# Patient Record
Sex: Male | Born: 1990 | Race: White | Hispanic: No | Marital: Single | Smoking: Former smoker
Health system: Southern US, Community
[De-identification: ages and names within clinical notes are randomized; demographics above are authoritative.]

## PROBLEM LIST (undated history)

## (undated) DIAGNOSIS — G4700 Insomnia, unspecified: Secondary | ICD-10-CM

## (undated) DIAGNOSIS — R079 Chest pain, unspecified: Secondary | ICD-10-CM

## (undated) DIAGNOSIS — I429 Cardiomyopathy, unspecified: Secondary | ICD-10-CM

## (undated) DIAGNOSIS — I10 Essential (primary) hypertension: Secondary | ICD-10-CM

## (undated) DIAGNOSIS — E785 Hyperlipidemia, unspecified: Secondary | ICD-10-CM

## (undated) DIAGNOSIS — F259 Schizoaffective disorder, unspecified: Secondary | ICD-10-CM

## (undated) DIAGNOSIS — K219 Gastro-esophageal reflux disease without esophagitis: Secondary | ICD-10-CM

## (undated) DIAGNOSIS — G4733 Obstructive sleep apnea (adult) (pediatric): Secondary | ICD-10-CM

## (undated) DIAGNOSIS — N35919 Unspecified urethral stricture, male, unspecified site: Secondary | ICD-10-CM

## (undated) DIAGNOSIS — E119 Type 2 diabetes mellitus without complications: Secondary | ICD-10-CM

## (undated) DIAGNOSIS — I4819 Other persistent atrial fibrillation: Secondary | ICD-10-CM

## (undated) DIAGNOSIS — G459 Transient cerebral ischemic attack, unspecified: Secondary | ICD-10-CM

## (undated) DIAGNOSIS — Z7901 Long term (current) use of anticoagulants: Secondary | ICD-10-CM

## (undated) DIAGNOSIS — I4891 Unspecified atrial fibrillation: Secondary | ICD-10-CM

## (undated) DIAGNOSIS — I5030 Unspecified diastolic (congestive) heart failure: Secondary | ICD-10-CM

## (undated) DIAGNOSIS — F449 Dissociative and conversion disorder, unspecified: Secondary | ICD-10-CM

## (undated) DIAGNOSIS — R0600 Dyspnea, unspecified: Secondary | ICD-10-CM

## (undated) DIAGNOSIS — K760 Fatty (change of) liver, not elsewhere classified: Secondary | ICD-10-CM

## (undated) DIAGNOSIS — G40909 Epilepsy, unspecified, not intractable, without status epilepticus: Secondary | ICD-10-CM

## (undated) DIAGNOSIS — I509 Heart failure, unspecified: Secondary | ICD-10-CM

## (undated) HISTORY — DX: Heart failure, unspecified: I50.9

---

## 2013-04-29 ENCOUNTER — Emergency Department: Payer: Self-pay | Admitting: Emergency Medicine

## 2013-04-29 LAB — URINALYSIS, COMPLETE
Bacteria: NONE SEEN
Bilirubin,UR: NEGATIVE
Glucose,UR: NEGATIVE mg/dL (ref 0–75)
Leukocyte Esterase: NEGATIVE
Protein: NEGATIVE
RBC,UR: NONE SEEN /HPF (ref 0–5)
Specific Gravity: 1.017 (ref 1.003–1.030)
Squamous Epithelial: <1
WBC UR: 2 /HPF (ref 0–5)

## 2013-04-29 LAB — DRUG SCREEN, URINE
Amphetamines, Ur Screen: NEGATIVE (ref ?–1000)
Cocaine Metabolite,Ur ~~LOC~~: NEGATIVE (ref ?–300)
Tricyclic, Ur Screen: NEGATIVE (ref ?–1000)

## 2013-04-29 LAB — TSH: Thyroid Stimulating Horm: 0.6 u[IU]/mL

## 2013-04-29 LAB — LITHIUM LEVEL: Lithium: <0.2 mmol/L — ABNORMAL LOW

## 2013-04-29 LAB — COMPREHENSIVE METABOLIC PANEL
Albumin: 3.7 g/dL (ref 3.4–5.0)
Anion Gap: 5 — ABNORMAL LOW (ref 7–16)
BUN: 15 mg/dL (ref 7–18)
Calcium, Total: 9.2 mg/dL (ref 8.5–10.1)
Co2: 25 mmol/L (ref 21–32)
Creatinine: 0.99 mg/dL (ref 0.60–1.30)
EGFR (African American): >60
EGFR (Non-African Amer.): >60
Glucose: 105 mg/dL — ABNORMAL HIGH (ref 65–99)
Osmolality: 273 (ref 275–301)
Potassium: 4 mmol/L (ref 3.5–5.1)
SGOT(AST): 29 U/L (ref 15–37)
SGPT (ALT): 29 U/L (ref 12–78)
Sodium: 136 mmol/L (ref 136–145)

## 2013-04-29 LAB — CBC
MCH: 26.4 pg (ref 26.0–34.0)
MCV: 80 fL (ref 80–100)
RBC: 4.52 10*6/uL (ref 4.40–5.90)
RDW: 14.1 % (ref 11.5–14.5)

## 2013-04-29 LAB — SALICYLATE LEVEL: Salicylates, Serum: <1.7 mg/dL

## 2013-04-29 LAB — ETHANOL: Ethanol %: <0.003 % (ref 0.000–0.080)

## 2013-04-29 LAB — ACETAMINOPHEN LEVEL: Acetaminophen: <2 ug/mL

## 2013-04-29 LAB — TROPONIN I: Troponin-I: <0.02 ng/mL

## 2013-04-30 ENCOUNTER — Emergency Department: Payer: Self-pay | Admitting: Emergency Medicine

## 2014-09-19 NOTE — Consult Note (Signed)
Brief Consult Note: Diagnosis: Boderline personality disorder, pseudoseizures.   Patient was seen by consultant.   Consult note dictated.   Recommend further assessment or treatment.   Comments: [name] has a h/o of diagnosis of schizophrenia, borderline personality disorder, seizures, and pseudoseizures.  He is maintained on Keppra and Topamax for seizure disorder, and Invega and Cymbalta for borderline personality disorder.  The patient is not suicidal, homicidal, or psychotic.    Plan: 1.  The patient no longer meets criteria for IVC.  I will terminate proceedings.  Please discharge as appropriate. 2.  No medication changes recommended.  3.  He will follow up with his neurologist in [place].  Electronic Signatures: Kristine LineaPucilowska, Sora Vrooman (MD)  (Signed 01-Dec-14 15:42)  Authored: Brief Consult Note   Last Updated: 01-Dec-14 15:42 by Kristine LineaPucilowska, Duru Reiger (MD)
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
## 2019-06-01 DIAGNOSIS — F32A Depression, unspecified: Secondary | ICD-10-CM | POA: Insufficient documentation

## 2020-08-04 DIAGNOSIS — I10 Essential (primary) hypertension: Secondary | ICD-10-CM | POA: Insufficient documentation

## 2020-08-31 DIAGNOSIS — R2990 Unspecified symptoms and signs involving the nervous system: Secondary | ICD-10-CM | POA: Insufficient documentation

## 2020-09-02 DIAGNOSIS — R6889 Other general symptoms and signs: Secondary | ICD-10-CM | POA: Insufficient documentation

## 2020-09-11 DIAGNOSIS — Z765 Malingerer [conscious simulation]: Secondary | ICD-10-CM | POA: Insufficient documentation

## 2020-09-13 DIAGNOSIS — D72829 Elevated white blood cell count, unspecified: Secondary | ICD-10-CM | POA: Insufficient documentation

## 2020-09-25 DIAGNOSIS — Z91199 Patient's noncompliance with other medical treatment and regimen due to unspecified reason: Secondary | ICD-10-CM | POA: Insufficient documentation

## 2020-10-10 DIAGNOSIS — R29898 Other symptoms and signs involving the musculoskeletal system: Secondary | ICD-10-CM | POA: Insufficient documentation

## 2020-10-10 DIAGNOSIS — R531 Weakness: Secondary | ICD-10-CM | POA: Insufficient documentation

## 2020-10-16 DIAGNOSIS — K219 Gastro-esophageal reflux disease without esophagitis: Secondary | ICD-10-CM | POA: Insufficient documentation

## 2021-01-28 DIAGNOSIS — F322 Major depressive disorder, single episode, severe without psychotic features: Secondary | ICD-10-CM | POA: Insufficient documentation

## 2021-02-12 DIAGNOSIS — G4734 Idiopathic sleep related nonobstructive alveolar hypoventilation: Secondary | ICD-10-CM | POA: Insufficient documentation

## 2021-03-14 DIAGNOSIS — R531 Weakness: Secondary | ICD-10-CM | POA: Insufficient documentation

## 2021-03-30 DIAGNOSIS — M109 Gout, unspecified: Secondary | ICD-10-CM | POA: Insufficient documentation

## 2021-08-02 ENCOUNTER — Inpatient Hospital Stay: Payer: Medicaid Other

## 2021-08-02 ENCOUNTER — Other Ambulatory Visit: Payer: Self-pay

## 2021-08-02 ENCOUNTER — Emergency Department: Payer: Medicaid Other

## 2021-08-02 ENCOUNTER — Inpatient Hospital Stay
Admission: EM | Admit: 2021-08-02 | Discharge: 2021-08-04 | DRG: 069 | Disposition: A | Discharge status: Home / Self Care | Payer: Medicaid Other | Attending: Internal Medicine | Admitting: Internal Medicine

## 2021-08-02 ENCOUNTER — Encounter: Payer: Self-pay | Admitting: *Deleted

## 2021-08-02 DIAGNOSIS — I4891 Unspecified atrial fibrillation: Secondary | ICD-10-CM

## 2021-08-02 DIAGNOSIS — I10 Essential (primary) hypertension: Secondary | ICD-10-CM | POA: Diagnosis present

## 2021-08-02 DIAGNOSIS — Z20822 Contact with and (suspected) exposure to covid-19: Secondary | ICD-10-CM | POA: Diagnosis present

## 2021-08-02 DIAGNOSIS — I482 Chronic atrial fibrillation, unspecified: Secondary | ICD-10-CM

## 2021-08-02 DIAGNOSIS — Z87891 Personal history of nicotine dependence: Secondary | ICD-10-CM | POA: Diagnosis not present

## 2021-08-02 DIAGNOSIS — Z6841 Body Mass Index (BMI) 40.0 and over, adult: Secondary | ICD-10-CM

## 2021-08-02 DIAGNOSIS — Z7901 Long term (current) use of anticoagulants: Secondary | ICD-10-CM | POA: Diagnosis not present

## 2021-08-02 DIAGNOSIS — R531 Weakness: Secondary | ICD-10-CM

## 2021-08-02 DIAGNOSIS — G8929 Other chronic pain: Secondary | ICD-10-CM | POA: Diagnosis present

## 2021-08-02 DIAGNOSIS — Z7982 Long term (current) use of aspirin: Secondary | ICD-10-CM | POA: Diagnosis not present

## 2021-08-02 DIAGNOSIS — R079 Chest pain, unspecified: Secondary | ICD-10-CM | POA: Diagnosis not present

## 2021-08-02 DIAGNOSIS — R519 Headache, unspecified: Secondary | ICD-10-CM

## 2021-08-02 DIAGNOSIS — E785 Hyperlipidemia, unspecified: Secondary | ICD-10-CM | POA: Diagnosis present

## 2021-08-02 DIAGNOSIS — Z8673 Personal history of transient ischemic attack (TIA), and cerebral infarction without residual deficits: Secondary | ICD-10-CM | POA: Diagnosis not present

## 2021-08-02 DIAGNOSIS — R0789 Other chest pain: Secondary | ICD-10-CM | POA: Diagnosis present

## 2021-08-02 DIAGNOSIS — Z79899 Other long term (current) drug therapy: Secondary | ICD-10-CM | POA: Diagnosis not present

## 2021-08-02 DIAGNOSIS — E119 Type 2 diabetes mellitus without complications: Secondary | ICD-10-CM | POA: Diagnosis present

## 2021-08-02 DIAGNOSIS — G4733 Obstructive sleep apnea (adult) (pediatric): Secondary | ICD-10-CM | POA: Diagnosis present

## 2021-08-02 DIAGNOSIS — I480 Paroxysmal atrial fibrillation: Secondary | ICD-10-CM | POA: Diagnosis present

## 2021-08-02 DIAGNOSIS — G459 Transient cerebral ischemic attack, unspecified: Secondary | ICD-10-CM | POA: Diagnosis present

## 2021-08-02 DIAGNOSIS — G8194 Hemiplegia, unspecified affecting left nondominant side: Secondary | ICD-10-CM | POA: Diagnosis present

## 2021-08-02 DIAGNOSIS — I4819 Other persistent atrial fibrillation: Secondary | ICD-10-CM | POA: Diagnosis not present

## 2021-08-02 HISTORY — DX: Type 2 diabetes mellitus without complications: E11.9

## 2021-08-02 HISTORY — DX: Obstructive sleep apnea (adult) (pediatric): G47.33

## 2021-08-02 HISTORY — DX: Long term (current) use of anticoagulants: Z79.01

## 2021-08-02 HISTORY — DX: Unspecified atrial fibrillation: I48.91

## 2021-08-02 HISTORY — DX: Essential (primary) hypertension: I10

## 2021-08-02 HISTORY — DX: Hyperlipidemia, unspecified: E78.5

## 2021-08-02 HISTORY — DX: Transient cerebral ischemic attack, unspecified: G45.9

## 2021-08-02 LAB — RESP PANEL BY RT-PCR (FLU A&B, COVID) ARPGX2
Influenza A by PCR: NEGATIVE
Influenza B by PCR: NEGATIVE
SARS Coronavirus 2 by RT PCR: NEGATIVE

## 2021-08-02 LAB — TROPONIN I (HIGH SENSITIVITY)
Troponin I (High Sensitivity): 3 ng/L (ref <18)
Troponin I (High Sensitivity): 4 ng/L (ref <18)

## 2021-08-02 LAB — URINALYSIS, ROUTINE W REFLEX MICROSCOPIC
Bilirubin Urine: NEGATIVE
Glucose, UA: NEGATIVE mg/dL
Hgb urine dipstick: NEGATIVE
Ketones, ur: NEGATIVE mg/dL
Nitrite: NEGATIVE
Protein, ur: NEGATIVE mg/dL
Specific Gravity, Urine: 1.017 (ref 1.005–1.030)
pH: 7 (ref 5.0–8.0)

## 2021-08-02 LAB — URINE DRUG SCREEN, QUALITATIVE (ARMC ONLY)
Amphetamines, Ur Screen: NOT DETECTED
Barbiturates, Ur Screen: NOT DETECTED
Benzodiazepine, Ur Scrn: NOT DETECTED
Cannabinoid 50 Ng, Ur ~~LOC~~: NOT DETECTED
Cocaine Metabolite,Ur ~~LOC~~: NOT DETECTED
MDMA (Ecstasy)Ur Screen: NOT DETECTED
Methadone Scn, Ur: NOT DETECTED
Opiate, Ur Screen: NOT DETECTED
Phencyclidine (PCP) Ur S: NOT DETECTED
Tricyclic, Ur Screen: NOT DETECTED

## 2021-08-02 LAB — CBC
HCT: 43 % (ref 39.0–52.0)
Hemoglobin: 13.8 g/dL (ref 13.0–17.0)
MCH: 27.8 pg (ref 26.0–34.0)
MCHC: 32.1 g/dL (ref 30.0–36.0)
MCV: 86.5 fL (ref 80.0–100.0)
Platelets: 220 10*3/uL (ref 150–400)
RBC: 4.97 MIL/uL (ref 4.22–5.81)
RDW: 13.3 % (ref 11.5–15.5)
WBC: 7.4 10*3/uL (ref 4.0–10.5)
nRBC: 0 % (ref 0.0–0.2)

## 2021-08-02 LAB — BASIC METABOLIC PANEL
Anion gap: 8 (ref 5–15)
BUN: 15 mg/dL (ref 6–20)
CO2: 32 mmol/L (ref 22–32)
Calcium: 8.8 mg/dL — ABNORMAL LOW (ref 8.9–10.3)
Chloride: 98 mmol/L (ref 98–111)
Creatinine, Ser: 0.8 mg/dL (ref 0.61–1.24)
GFR, Estimated: >60 mL/min (ref >60)
Glucose, Bld: 94 mg/dL (ref 70–99)
Potassium: 4.2 mmol/L (ref 3.5–5.1)
Sodium: 138 mmol/L (ref 135–145)

## 2021-08-02 LAB — PROTIME-INR
INR: 1 (ref 0.8–1.2)
Prothrombin Time: 13.3 seconds (ref 11.4–15.2)

## 2021-08-02 LAB — APTT: aPTT: 30 seconds (ref 24–36)

## 2021-08-02 MED ORDER — IOHEXOL 350 MG/ML SOLN
125.0000 mL | Freq: Once | INTRAVENOUS | Status: AC | PRN
  Administered 2021-08-02: 125 mL via INTRAVENOUS
  Filled 2021-08-02: qty 125

## 2021-08-02 NOTE — Assessment & Plan Note (Signed)
MRI negative ?Can consider nephrology consult in the a.m. ?Can consider nephrology consult in the a.m. ?

## 2021-08-02 NOTE — Assessment & Plan Note (Signed)
Rate controlled - Continue diltiazem - Continue apixaban 

## 2021-08-02 NOTE — H&P (Signed)
?History and Physical  ? ? ?Patient: Daniel [name] ZOX:[id number] DOB: 02-Jun-1990 ?DOA: 08/02/2021 ?DOS: the patient was seen and examined on 08/02/2021 ?PCP: Pcp, No  ?Patient coming from: Home ? ?Chief Complaint:  ?Chief Complaint  ?Patient presents with  ? Chest Pain  ? ? ?HPI: Daniel [name] is a 31 y.o. male with medical history significant for A-fib on Eliquis, class III obesity, prior TIA who presents to the ED with chest pain, headache, left-sided weakness ongoing for the past few days.  States his chest pain is mid chest radiating to the left arm.  His left-sided weakness started a day prior and is associated with a headache. ?ED course: BP 153/80, pulse 108 with otherwise normal vitals ?Blood work unremarkable with troponin of 4 and normal UDS ?EKG, A-fib at 97 with no acute ST-T wave changes ?Imaging: MRI, CTA head and neck all negative for stroke ?Hospitalist consulted for admission.  ? ?Review of Systems: As mentioned in the history of present illness. All other systems reviewed and are negative. ?No past medical history on file. ?The histories are not reviewed yet. Please review them in the "History" navigator section and refresh this SmartLink. ?Social History:  reports that he has quit smoking. His smoking use included cigarettes. He has never used smokeless tobacco. He reports that he does not currently use alcohol. No history on file for drug use. ? ?Allergies  ?Allergen Reactions  ? Demerol [Meperidine Hcl]   ? Haldol [Haloperidol]   ? ? ?No family history on file. ? ?Prior to Admission medications   ?Medication Sig Start Date End Date Taking? Authorizing Provider  ?albuterol (VENTOLIN HFA) 108 (90 Base) MCG/ACT inhaler Inhale into the lungs. 07/12/21   [provider]  ?allopurinol (ZYLOPRIM) 100 MG tablet Take 100 mg by mouth daily. 06/01/21   [provider]  ?aspirin 325 MG tablet Take 325 mg by mouth every morning. 06/01/21   [provider]  ?atorvastatin (LIPITOR) 80  MG tablet Take 80 mg by mouth daily. 05/11/21   [provider]  ?CARTIA XT 240 MG 24 hr capsule Take 240 mg by mouth daily. 07/26/21   [provider]  ?cephALEXin (KEFLEX) 500 MG capsule Take 500 mg by mouth 2 (two) times daily. 06/16/21   [provider]  ?cyclobenzaprine (FLEXERIL) 5 MG tablet Take 5 mg by mouth 3 (three) times daily. 06/16/21   [provider]  ?dicyclomine (BENTYL) 10 MG capsule Take 1 mg by mouth daily. 06/01/21   [provider]  ?diltiazem (CARDIZEM SR) 90 MG 12 hr capsule Take 180 mg by mouth 2 (two) times daily. 07/12/21   [provider]  ?divalproex (DEPAKOTE) 500 MG DR tablet Take by mouth. 07/21/21   [provider]  ?ELIQUIS 5 MG TABS tablet Take 5 mg by mouth 2 (two) times daily. 06/16/21   [provider]  ?escitalopram (LEXAPRO) 10 MG tablet Take 10 mg by mouth daily. 06/16/21   [provider]  ?famotidine (PEPCID) 20 MG tablet  05/11/21   [provider]  ?fluticasone (FLONASE) 50 MCG/ACT nasal spray Place 2 sprays into both nostrils 2 (two) times daily. 06/16/21   [provider]  ?hydrochlorothiazide (HYDRODIURIL) 25 MG tablet Take 25 mg by mouth every morning. 06/16/21   [provider]  ?hydrOXYzine (ATARAX) 50 MG tablet Take 50 mg by mouth 3 (three) times daily. 07/21/21   [provider]  ?ibuprofen (ADVIL) 800 MG tablet Take 800 mg by mouth 3 (three) times  daily. 07/09/21   [provider]  ?INVEGA 3 MG 24 hr tablet Take 3 mg by mouth daily. 02/05/21   [provider]  ?INVEGA 9 MG 24 hr tablet Take 9 mg by mouth every morning. 07/21/21   [provider]  ?loratadine (CLARITIN) 10 MG tablet Take 10 mg by mouth daily. 06/16/21   [provider]  ?melatonin 3 MG TABS tablet Take by mouth. 06/08/21   [provider]  ?metoprolol tartrate (LOPRESSOR) 50 MG tablet  05/11/21   [provider]  ? ? ?Physical Exam: ?Vitals:  ?  08/02/21 1729 08/02/21 1730  ?BP: (!) 153/80   ?Pulse: (!) 108   ?Resp: 18   ?Temp: 98.7 ?F (37.1 ?C)   ?TempSrc: Oral   ?SpO2: 95%   ?Weight:  (!) 179.6 kg  ?Height:  6\' 5"  (1.956 m)  ? ?Physical Exam ?Vitals and nursing note reviewed.  ?Constitutional:   ?   General: He is not in acute distress. ?   Appearance: Normal appearance. He is obese.  ?HENT:  ?   Head: Normocephalic and atraumatic.  ?Cardiovascular:  ?   Rate and Rhythm: Normal rate and regular rhythm.  ?   Pulses: Normal pulses.  ?   Heart sounds: Normal heart sounds. No murmur heard. ?Pulmonary:  ?   Effort: Pulmonary effort is normal.  ?   Breath sounds: Normal breath sounds. No wheezing or rhonchi.  ?Abdominal:  ?   General: Bowel sounds are normal.  ?   Palpations: Abdomen is soft.  ?   Tenderness: There is no abdominal tenderness.  ?Musculoskeletal:     ?   General: No swelling or tenderness. Normal range of motion.  ?   Cervical back: Normal range of motion and neck supple.  ?Skin: ?   General: Skin is warm and dry.  ?Neurological:  ?   General: No focal deficit present.  ?   Mental Status: He is alert. Mental status is at baseline.  ?Psychiatric:     ?   Mood and Affect: Mood normal.     ?   Behavior: Behavior normal.  ? ? ? ?Data Reviewed: ?Relevant notes from primary care and specialist visits, past discharge summaries as available in EHR, including Care Everywhere. ?Prior diagnostic testing as pertinent to current admission diagnoses ?Updated medications and problem lists for reconciliation ?ED course, including vitals, labs, imaging, treatment and response to treatment ?Triage notes, nursing and pharmacy notes and ED provider's notes ?Notable results as noted in HPI ? ? ?Assessment and Plan: ?* Left-sided weakness ?No evidence of stroke on extensive imaging work-up ?Continue Eliquis ?Continue statin ? ?Chest pain ?Troponin 4 ?Continue to trend ?Low suspicion for ACS ? ?Headache ?MRI negative ?Can consider nephrology consult in the a.m. ?Can  consider nephrology consult in the a.m. ? ?Chronic anticoagulation ?Continue apixaban ? ?Atrial fibrillation, chronic (HCC) ?Rate controlled.  Continue diltiazem  ?Continue apixaban ? ?Obesity, Class III, BMI 40-49.9 (morbid obesity) (HCC) ?Complicating factor to overall prognosis and care ? ? ? ? ? ? ?Advance Care Planning:   Code Status: Not on file full ? ?Consults: none ? ?Family Communication: none ? ?Severity of Illness: ?The appropriate patient status for this patient is INPATIENT. Inpatient status is judged to be reasonable and necessary in order to provide the required intensity of service to ensure the patient's safety. The patient's presenting symptoms, physical exam findings, and initial radiographic and laboratory data in the context of their chronic comorbidities is felt  to place them at high risk for further clinical deterioration. Furthermore, it is not anticipated that the patient will be medically stable for discharge from the hospital within 2 midnights of admission.  ? ?* I certify that at the point of admission it is my clinical judgment that the patient will require inpatient hospital care spanning beyond 2 midnights from the point of admission due to high intensity of service, high risk for further deterioration and high frequency of surveillance required.* ? ?Author: ?Andris Baumann, MD ?08/02/2021 11:57 PM ? ?For on call review [email or web address].  ?

## 2021-08-02 NOTE — Assessment & Plan Note (Signed)
No evidence of stroke on extensive imaging work-up ?Continue Eliquis ?Continue statin ?

## 2021-08-02 NOTE — ED Notes (Signed)
Pt to mri 

## 2021-08-02 NOTE — ED Provider Notes (Signed)
? ?[facility] ?Provider Note ? ? ? Event Date/Time  ? First MD Initiated Contact with Patient 08/02/21 2002   ?  (approximate) ? ? ?History  ? ?Chest Pain ? ? ?HPI ? ?Daniel [name] is a 31 y.o. male with a past medical history of TIA, A-fib on diltiazem and anticoagulated on Eliquis who presents for evaluation of several symptoms including chest pain, headache and some left-sided weakness.  Patient states he has had some substernal chest pain for about 3 to 4 days rating to the left arm.  States he developed some left arm and leg weakness 3 days ago as well as decree sensation.  States he developed headache 1 day ago.  States he feels that he has decreased vision in the left visual fields that he noticed yesterday.  He has not had any headache, back pain, cough, shortness of breath, vertigo, other vision changes, right arm or leg weakness or decrease sensation, abdominal pain, vomiting, diarrhea rash or extremity pain.  Denies any EtOH use, illicit drug use or tobacco abuse.  States he is compliant with all his medications.  States he has never had any weakness like this before. ? ?  ? ? ?Physical Exam  ?Triage Vital Signs: ?ED Triage Vitals  ?Enc Vitals Group  ?   BP 08/02/21 1729 (!) 153/80  ?   Pulse Rate 08/02/21 1729 (!) 108  ?   Resp 08/02/21 1729 18  ?   Temp 08/02/21 1729 98.7 ?F (37.1 ?C)  ?   Temp Source 08/02/21 1729 Oral  ?   SpO2 08/02/21 1729 95 %  ?   Weight 08/02/21 1730 (!) 396 lb (179.6 kg)  ?   Height 08/02/21 1730 6\' 5"  (1.956 m)  ?   Head Circumference --   ?   Peak Flow --   ?   Pain Score 08/02/21 1730 10  ?   Pain Loc --   ?   Pain Edu? --   ?   Excl. in [place]? --   ? ? ?Most recent vital signs: ?Vitals:  ? 08/02/21 1729  ?BP: (!) 153/80  ?Pulse: (!) 108  ?Resp: 18  ?Temp: 98.7 ?F (37.1 ?C)  ?SpO2: 95%  ? ? ?General: Awake, no distress.  ?CV:  Good peripheral perfusion.  Slightly tachycardic with irregular rhythm.  2+ radial pulse. ?Resp:  Normal effort.  Clear  bilaterally. ?Abd:  No distention.  Soft throughout. ?Other:  With exception of decreased vision and left-sided visual field cranial nerves II through XII appear grossly intact.  Patient does seem weaker in the left arm and leg compared to the right and reports decree sensation to light touch.  No finger dysmetria. ? ? ?ED Results / Procedures / Treatments  ?Labs ?(all labs ordered are listed, but only abnormal results are displayed) ?Labs Reviewed  ?BASIC METABOLIC PANEL - Abnormal; Notable for the following components:  ?    Result Value  ? Calcium 8.8 (*)   ? All other components within normal limits  ?URINALYSIS, ROUTINE W REFLEX MICROSCOPIC - Abnormal; Notable for the following components:  ? Color, Urine YELLOW (*)   ? APPearance CLEAR (*)   ? Leukocytes,Ua MODERATE (*)   ? Bacteria, UA RARE (*)   ? All other components within normal limits  ?RESP PANEL BY RT-PCR (FLU A&B, COVID) ARPGX2  ?CBC  ?PROTIME-INR  ?APTT  ?URINE DRUG SCREEN, QUALITATIVE ([facility] ONLY)  ?VALPROIC ACID LEVEL  ?TROPONIN I (HIGH SENSITIVITY)  ?TROPONIN  I (HIGH SENSITIVITY)  ? ? ? ?EKG ? ?ECG is remarkable A-fib with a ventricular rate of 97, otherwise unremarkable intervals some nonspecific changes throughout versus artifact. ? ? ?RADIOLOGY ?Chest reviewed by myself shows no focal consoidation, effusion, edema, pneumothorax or other clear acute thoracic process. I also reviewed radiology interpretation and agree with findings described. ? ?CTA chest, interpretation without evidence of dissection, large aneurysm, pericardial effusion, saddle PE, pneumothorax, effusion, edema or other clear acute thoracic process.  I also reviewed radiologist interpretation and agree with their findings of same including ability to completely exclude more distal PE. ? ?CTA head and neck on my interpretation without evidence of large vessel occlusion, intracranial hemorrhage, obvious ischemia or other clear acute intracranial process.  I also reviewed radiology  interpretation and agree with their findings of same. ? ? ?PROCEDURES: ? ?Critical Care performed: No ? ?Procedures ? ? ? ?MEDICATIONS ORDERED IN ED: ?Medications  ?iohexol (OMNIPAQUE) 350 MG/ML injection 125 mL (125 mLs Intravenous Contrast Given 08/02/21 2111)  ? ? ? ?IMPRESSION / MDM / ASSESSMENT AND PLAN / ED COURSE  ?I reviewed the triage vital signs and the nursing notes. ?             ?               ? ?Differential diagnosis includes, but is not limited to ACS, dissection, PE, CVA, intracranial hemorrhage, metabolic derangements, anemia, pneumonia and bronchitis. ? ?Chest reviewed by myself shows no focal consoidation, effusion, edema, pneumothorax or other clear acute thoracic process. I also reviewed radiology interpretation and agree with findings described. ? ?ECG is remarkable A-fib with a ventricular rate of 97, otherwise unremarkable intervals some nonspecific changes throughout versus artifact.  Nonelevated troponin x2 is not suggestive of ACS or myocarditis.   ? ?COVID influenza PCR is negative.  UA shows some moderate leukocyte esterase and rare bacteria but otherwise unremarkable.  Urine drug screen is negative.  Patient has no urinary symptoms to suggest clinically significant cystitis at this time.  INR and PTT are unremarkable. ? ?BMP shows no significant electrolyte or metabolic derangements.  CBC without leukocytosis or acute anemia. ? ?CTA chest, interpretation without evidence of dissection, large aneurysm, pericardial effusion, saddle PE, pneumothorax, effusion, edema or other clear acute thoracic process.  I also reviewed radiologist interpretation and agree with their findings of same including ability to completely exclude more distal PE. ? ?CTA head and neck on my interpretation without evidence of large vessel occlusion, intracranial hemorrhage, obvious ischemia or other clear acute intracranial process.  I also reviewed radiology interpretation and agree with their findings of  same. ? ?Low suspicion for PE given patient reports compliance with his Eliquis. ? ?Given concern for possible CVA I will admit to medicine service for further evaluation and management.  I discussed this with admitting hospitalist.  Unclear etiology of patient's chest discomfort at this time. ? ?  ? ? ?FINAL CLINICAL IMPRESSION(S) / ED DIAGNOSES  ? ?Final diagnoses:  ?Chest pain, unspecified type  ?Weakness  ?Atrial fibrillation, unspecified type ([place])  ?Anticoagulated  ? ? ? ?Rx / DC Orders  ? ?ED Discharge Orders   ? ? None  ? ?  ? ? ? ?Note:  This document was prepared using Dragon voice recognition software and may include unintentional dictation errors. ?  ?Lucrezia Starch, MD ?08/02/21 2244 ? ?

## 2021-08-02 NOTE — Assessment & Plan Note (Signed)
Complicating factor to overall prognosis and care 

## 2021-08-02 NOTE — ED Notes (Signed)
First Nurse Note:  Pt to ED via [facility] from home for central chest pain for the last 2 days that radiates into his left arm. Pain comes and goes. Pt has hx/o A. Fib. EKG showed A. Fib with RVR at a rate of 80-130. Pt was given about 500 cc of fluid and HR now between 70-110. BP 112/48. Pt in ICU 2 weeks ago for A FIb RVR,  ?

## 2021-08-02 NOTE — ED Notes (Signed)
Pt sleeping. 

## 2021-08-02 NOTE — Assessment & Plan Note (Signed)
Continue apixaban 

## 2021-08-02 NOTE — ED Notes (Signed)
Kelly called at [facility] to inform of pending admission of pt.  ?

## 2021-08-02 NOTE — ED Notes (Addendum)
[facility] HOME  ?Claiborne Billings [phone] - transportation to from home  ?Awanda Mink [phone] - [facility] provider  ?

## 2021-08-02 NOTE — ED Triage Notes (Signed)
Pt brought in via ems from home with chest pain   sx began 2 days  hx afib.  Iv in place.  Pt also has sob.  Nonsmoker  pt alert.  ?

## 2021-08-02 NOTE — Assessment & Plan Note (Signed)
Troponin 4 ?Continue to trend ?Low suspicion for ACS ?

## 2021-08-03 ENCOUNTER — Encounter: Payer: Self-pay | Admitting: Internal Medicine

## 2021-08-03 DIAGNOSIS — R079 Chest pain, unspecified: Secondary | ICD-10-CM

## 2021-08-03 DIAGNOSIS — R531 Weakness: Secondary | ICD-10-CM | POA: Diagnosis not present

## 2021-08-03 DIAGNOSIS — Z8673 Personal history of transient ischemic attack (TIA), and cerebral infarction without residual deficits: Secondary | ICD-10-CM

## 2021-08-03 DIAGNOSIS — G4733 Obstructive sleep apnea (adult) (pediatric): Secondary | ICD-10-CM

## 2021-08-03 DIAGNOSIS — I480 Paroxysmal atrial fibrillation: Secondary | ICD-10-CM

## 2021-08-03 LAB — MAGNESIUM: Magnesium: 2 mg/dL (ref 1.7–2.4)

## 2021-08-03 LAB — TSH: TSH: 1.346 u[IU]/mL (ref 0.350–4.500)

## 2021-08-03 LAB — HIV ANTIBODY (ROUTINE TESTING W REFLEX): HIV Screen 4th Generation wRfx: NONREACTIVE

## 2021-08-03 LAB — CBG MONITORING, ED: Glucose-Capillary: 132 mg/dL — ABNORMAL HIGH (ref 70–99)

## 2021-08-03 LAB — VALPROIC ACID LEVEL: Valproic Acid Lvl: 63 ug/mL (ref 50.0–100.0)

## 2021-08-03 MED ORDER — ONDANSETRON HCL 4 MG/2ML IJ SOLN
4.0000 mg | Freq: Four times a day (QID) | INTRAMUSCULAR | Status: DC | PRN
Start: 2021-08-03 — End: 2021-08-04
  Administered 2021-08-03: 4 mg via INTRAVENOUS
  Filled 2021-08-03: qty 2

## 2021-08-03 MED ORDER — DIVALPROEX SODIUM 500 MG PO DR TAB
1000.0000 mg | DELAYED_RELEASE_TABLET | Freq: Every morning | ORAL | Status: DC
  Administered 2021-08-03 – 2021-08-04 (×2): 1000 mg via ORAL
  Filled 2021-08-03 (×2): qty 2

## 2021-08-03 MED ORDER — BACLOFEN 10 MG PO TABS
10.0000 mg | ORAL_TABLET | Freq: Two times a day (BID) | ORAL | Status: DC
  Administered 2021-08-03 – 2021-08-04 (×2): 10 mg via ORAL
  Filled 2021-08-03 (×3): qty 1

## 2021-08-03 MED ORDER — MORPHINE SULFATE (PF) 2 MG/ML IV SOLN: 2.0000 mg | INTRAVENOUS | Status: DC | PRN

## 2021-08-03 MED ORDER — METOPROLOL TARTRATE 50 MG PO TABS
100.0000 mg | ORAL_TABLET | Freq: Two times a day (BID) | ORAL | Status: DC
  Administered 2021-08-03 – 2021-08-04 (×2): 100 mg via ORAL
  Filled 2021-08-03 (×2): qty 2

## 2021-08-03 MED ORDER — METOPROLOL TARTRATE 50 MG PO TABS
75.0000 mg | ORAL_TABLET | Freq: Once | ORAL | Status: AC
  Administered 2021-08-03: 75 mg via ORAL
  Filled 2021-08-03: qty 1

## 2021-08-03 MED ORDER — PALIPERIDONE ER 3 MG PO TB24
9.0000 mg | ORAL_TABLET | Freq: Every morning | ORAL | Status: DC
Start: 2021-08-03 — End: 2021-08-04
  Administered 2021-08-03 – 2021-08-04 (×2): 9 mg via ORAL
  Filled 2021-08-03 (×2): qty 3

## 2021-08-03 MED ORDER — ONDANSETRON HCL 4 MG PO TABS: 4.0000 mg | ORAL_TABLET | Freq: Four times a day (QID) | ORAL | Status: DC | PRN

## 2021-08-03 MED ORDER — ATORVASTATIN CALCIUM 80 MG PO TABS
80.0000 mg | ORAL_TABLET | Freq: Every day | ORAL | Status: DC
  Administered 2021-08-03 – 2021-08-04 (×2): 80 mg via ORAL
  Filled 2021-08-03 (×2): qty 1

## 2021-08-03 MED ORDER — APIXABAN 5 MG PO TABS
5.0000 mg | ORAL_TABLET | Freq: Two times a day (BID) | ORAL | Status: DC
  Administered 2021-08-03 – 2021-08-04 (×3): 5 mg via ORAL
  Filled 2021-08-03 (×3): qty 1

## 2021-08-03 MED ORDER — FLUTICASONE PROPIONATE 50 MCG/ACT NA SUSP
2.0000 | Freq: Two times a day (BID) | NASAL | Status: DC
  Filled 2021-08-03: qty 16

## 2021-08-03 MED ORDER — PALIPERIDONE ER 3 MG PO TB24: 3.0000 mg | ORAL_TABLET | Freq: Every day | ORAL | Status: DC

## 2021-08-03 MED ORDER — METOPROLOL TARTRATE 25 MG PO TABS
25.0000 mg | ORAL_TABLET | Freq: Two times a day (BID) | ORAL | Status: DC
  Administered 2021-08-03: 25 mg via ORAL
  Filled 2021-08-03: qty 1

## 2021-08-03 MED ORDER — DIVALPROEX SODIUM 500 MG PO DR TAB
1500.0000 mg | DELAYED_RELEASE_TABLET | Freq: Every day | ORAL | Status: DC
Start: 2021-08-03 — End: 2021-08-04
  Administered 2021-08-03: 1500 mg via ORAL
  Filled 2021-08-03 (×3): qty 3

## 2021-08-03 MED ORDER — ACETAMINOPHEN 325 MG PO TABS
650.0000 mg | ORAL_TABLET | Freq: Four times a day (QID) | ORAL | Status: DC | PRN
  Administered 2021-08-03: 650 mg via ORAL
  Filled 2021-08-03: qty 2

## 2021-08-03 MED ORDER — ESCITALOPRAM OXALATE 10 MG PO TABS
10.0000 mg | ORAL_TABLET | Freq: Every day | ORAL | Status: DC
Start: 2021-08-03 — End: 2021-08-04
  Administered 2021-08-03 – 2021-08-04 (×2): 10 mg via ORAL
  Filled 2021-08-03 (×2): qty 1

## 2021-08-03 MED ORDER — IBUPROFEN 400 MG PO TABS
400.0000 mg | ORAL_TABLET | Freq: Three times a day (TID) | ORAL | Status: DC | PRN
  Administered 2021-08-04: 400 mg via ORAL
  Filled 2021-08-03: qty 1

## 2021-08-03 MED ORDER — HYDROCODONE-ACETAMINOPHEN 5-325 MG PO TABS
1.0000 | ORAL_TABLET | Freq: Four times a day (QID) | ORAL | Status: DC | PRN
  Administered 2021-08-03: 1 via ORAL
  Filled 2021-08-03: qty 1

## 2021-08-03 MED ORDER — ACETAMINOPHEN 650 MG RE SUPP: 650.0000 mg | Freq: Four times a day (QID) | RECTAL | Status: DC | PRN

## 2021-08-03 MED ORDER — CYCLOBENZAPRINE HCL 10 MG PO TABS
5.0000 mg | ORAL_TABLET | Freq: Three times a day (TID) | ORAL | Status: DC
  Administered 2021-08-03 – 2021-08-04 (×3): 5 mg via ORAL
  Filled 2021-08-03 (×3): qty 1

## 2021-08-03 MED ORDER — HYDROXYZINE HCL 25 MG PO TABS
50.0000 mg | ORAL_TABLET | Freq: Three times a day (TID) | ORAL | Status: DC
Start: 2021-08-03 — End: 2021-08-04
  Administered 2021-08-03 – 2021-08-04 (×5): 50 mg via ORAL
  Filled 2021-08-03 (×5): qty 2

## 2021-08-03 MED ORDER — FAMOTIDINE 20 MG PO TABS
20.0000 mg | ORAL_TABLET | Freq: Every day | ORAL | Status: DC
  Administered 2021-08-03 – 2021-08-04 (×2): 20 mg via ORAL
  Filled 2021-08-03 (×2): qty 1

## 2021-08-03 MED ORDER — DILTIAZEM HCL ER COATED BEADS 120 MG PO CP24
240.0000 mg | ORAL_CAPSULE | Freq: Every day | ORAL | Status: DC
  Administered 2021-08-03: 240 mg via ORAL
  Filled 2021-08-03: qty 1

## 2021-08-03 NOTE — Consult Note (Signed)
Cardiology Consultation:   Patient ID: Daniel [name] MRN: [id number]; DOB: 1990-09-17  Admit date: 08/02/2021 Date of Consult: 08/03/2021  PCP:  Daniel [name], No   [facility] Providers Cardiologist:  None   {   Patient Profile:   Daniel [name] is a 31 y.o. male with a hx of TIA, Afib on Eliquis and diltiazem and metoprolol, TIAs, Dm2, OSA not on CPAP, HTN, HLD, who is being seen 08/03/2021 for the evaluation of chest pain at the request of Dr. Allena Katz.  History of Present Illness:   [name] reports he just moved here from [place] to live in a group home. He was followed cardiology with [facility]. Reports a history of afib starting back when he was 20 from too much caffeine. Since then he has been on Eliquis and diltiazem. Says they have been discussing ablation. Reports history elevated troponin in the setting of afib, no prior cardiac cath or stenting. Possible history of TIAs. Also reports history of heart failure. H/o OSA not on CPAP, trying to get a sleep study.   The patient presented to the ER 3/6 for chest pain. Chest pain started 2 days ago. Started while at rest, came on all of a sudden. It was persistent. Pain is a sharp pain in the center of the chest and down the left arm. Reports pain is worse with exertion. He feels his heart racing. Says this is similar to prior Afib episodes. Says he was hospitalized 2 weeks ago for the same thing (Afib RVR). He denies missing doses of the Eliquis. No recent fever. Has chills. Reports LLE. And chronic orthopnea and pnd.   In the ER BP 153/80, pulse 108, RR 18, afebrile. Labs showed calcium 8.8. HS trop 3>4.  EKG showed Afib 99bpm with inferolateral TWI. CXR was non-acute. CTA chest was negative. CTA head and neck showed no acute process. MRI brain was negative.   He has chest pain now, not better than when he came.   Past Medical History:  Diagnosis Date   Atrial fibrillation (HCC)    Current use of long term  anticoagulation    Diabetes mellitus type 2, uncomplicated (HCC)    Hyperlipidemia    Hypertension    OSA (obstructive sleep apnea)    TIA (transient ischemic attack)     History reviewed. No pertinent surgical history.   Home Medications:  Prior to Admission medications   Medication Sig Start Date End Date Taking? Authorizing Provider  albuterol (VENTOLIN HFA) 108 (90 Base) MCG/ACT inhaler Inhale into the lungs. 07/12/21  Yes [provider]  allopurinol (ZYLOPRIM) 100 MG tablet Take 100 mg by mouth daily. 06/01/21  Yes [provider]  atorvastatin (LIPITOR) 80 MG tablet Take 80 mg by mouth daily. 05/11/21  Yes [provider]  ELIQUIS 5 MG TABS tablet Take 5 mg by mouth 2 (two) times daily. 06/16/21  Yes [provider]  aspirin 325 MG tablet Take 325 mg by mouth every morning. Patient not taking: Reported on 08/03/2021 06/01/21   [provider]  CARTIA XT 240 MG 24 hr capsule Take 240 mg by mouth daily. 07/26/21   [provider]  cephALEXin (KEFLEX) 500 MG capsule Take 500 mg by mouth 2 (two) times daily. 06/16/21   [provider]  cyclobenzaprine (FLEXERIL) 5 MG tablet Take 5 mg by mouth 3 (three) times daily. 06/16/21   [provider]  dicyclomine (BENTYL) 10 MG capsule Take 1 mg by mouth daily. 06/01/21  [provider]  diltiazem (CARDIZEM SR) 90 MG 12 hr capsule Take 180 mg by mouth 2 (two) times daily. 07/12/21   [provider]  divalproex (DEPAKOTE) 500 MG DR tablet Take by mouth. 07/21/21   [provider]  escitalopram (LEXAPRO) 10 MG tablet Take 10 mg by mouth daily. 06/16/21   [provider]  famotidine (PEPCID) 20 MG tablet  05/11/21   [provider]  fluticasone (FLONASE) 50 MCG/ACT nasal spray Place 2 sprays into both nostrils 2 (two) times daily. 06/16/21   [provider]  hydrochlorothiazide (HYDRODIURIL) 25 MG tablet Take 25 mg by mouth every morning.  06/16/21   [provider]  hydrOXYzine (ATARAX) 50 MG tablet Take 50 mg by mouth 3 (three) times daily. 07/21/21   [provider]  ibuprofen (ADVIL) 800 MG tablet Take 800 mg by mouth 3 (three) times daily. 07/09/21   [provider]  INVEGA 3 MG 24 hr tablet Take 3 mg by mouth daily. 02/05/21   [provider]  INVEGA 9 MG 24 hr tablet Take 9 mg by mouth every morning. 07/21/21   [provider]  loratadine (CLARITIN) 10 MG tablet Take 10 mg by mouth daily. 06/16/21   [provider]  melatonin 3 MG TABS tablet Take by mouth. 06/08/21   [provider]  metoprolol tartrate (LOPRESSOR) 50 MG tablet  05/11/21   [provider]    Inpatient Medications: Scheduled Meds:  apixaban  5 mg Oral BID   atorvastatin  80 mg Oral q1800   diltiazem  240 mg Oral Daily   divalproex  500 mg Oral Q1500   escitalopram  10 mg Oral Daily   famotidine  20 mg Oral Daily   [START ON 08/04/2021] fluticasone  2 spray Each Nare BID   hydrOXYzine  50 mg Oral TID   metoprolol tartrate  100 mg Oral BID   paliperidone  9 mg Oral q morning   Continuous Infusions:  PRN Meds: acetaminophen **OR** acetaminophen, HYDROcodone-acetaminophen, ondansetron **OR** ondansetron (ZOFRAN) IV  Allergies:    Allergies  Allergen Reactions   Haldol [Haloperidol] Other (See Comments)    SI   Abilify [Aripiprazole] Palpitations   Demerol [Meperidine Hcl] Hives    Social History:   Social History   Socioeconomic History   Marital status: Single    Spouse name: Not on file   Number of children: Not on file   Years of education: Not on file   Highest education level: Not on file  Occupational History   Not on file  Tobacco Use   Smoking status: Former    Types: Cigarettes   Smokeless tobacco: Never  Substance and Sexual Activity   Alcohol use: Not Currently   Drug use: Not on file   Sexual activity: Not on file  Other Topics Concern   Not on file   Social History Narrative   Not on file   Social Determinants of Health   Financial Resource Strain: Not on file  Food Insecurity: Not on file  Transportation Needs: Not on file  Physical Activity: Not on file  Stress: Not on file  Social Connections: Not on file  Intimate Partner Violence: Not on file    Family History:   History reviewed. No pertinent family history.   ROS:  Please see the history of present illness.   All other ROS reviewed and negative.     Physical Exam/Data:   Vitals:   08/03/21 0300 08/03/21  [id number] 08/03/21 0726 08/03/21 1000  BP: 115/67  133/83 (!) 153/96  Pulse:  (!) 114 (!) 114 (!) 125  Resp: 19 13 17  (!) 23  Temp:      TempSrc:      SpO2: 95% 98% 98%   Weight:      Height:       No intake or output data in the 24 hours ending 08/03/21 1224 Last 3 Weights 08/02/2021  Weight (lbs) 396 lb  Weight (kg) 179.624 kg     Body mass index is 46.96 kg/m.  General:  Well nourished, well developed, in no acute distress HEENT: normal Neck: no JVD Vascular: No carotid bruits; Distal pulses 2+ bilaterally Cardiac:  normal S1, S2; Irreg Irreg; no murmur  Lungs:  clear to auscultation bilaterally, no wheezing, rhonchi or rales  Abd: soft, nontender, no hepatomegaly  Ext: no edema Musculoskeletal:  No deformities, BUE and BLE strength normal and equal Skin: warm and dry  Neuro:  CNs 2-12 intact, no focal abnormalities noted Psych:  Normal affect   EKG:  The EKG was personally reviewed and demonstrates:  Afib 96bpm, inferolateral TWI Telemetry:  Telemetry was personally reviewed and demonstrates:  Afib, HR 100-130s, 4 beats NSVT  Relevant CV Studies:  Echo ordered  Laboratory Data:  High Sensitivity Troponin:   Recent Labs  Lab 08/02/21 1733 08/02/21 2027  TROPONINIHS 3 4     Chemistry Recent Labs  Lab 08/02/21 1733  NA 138  K 4.2  CL 98  CO2 32  GLUCOSE 94  BUN 15  CREATININE 0.80  CALCIUM 8.8*  GFRNONAA >60  ANIONGAP 8    No  results for input(s): PROT, ALBUMIN, AST, ALT, ALKPHOS, BILITOT in the last 168 hours. Lipids No results for input(s): CHOL, TRIG, HDL, LABVLDL, LDLCALC, CHOLHDL in the last 168 hours.  Hematology Recent Labs  Lab 08/02/21 1733  WBC 7.4  RBC 4.97  HGB 13.8  HCT 43.0  MCV 86.5  MCH 27.8  MCHC 32.1  RDW 13.3  PLT 220   Thyroid No results for input(s): TSH, FREET4 in the last 168 hours.  BNPNo results for input(s): BNP, PROBNP in the last 168 hours.  DDimer No results for input(s): DDIMER in the last 168 hours.   Radiology/Studies:  CT ANGIO HEAD NECK W WO CM  Result Date: 08/02/2021 CLINICAL DATA:  Initial evaluation for neuro deficit, stroke suspected. EXAM: CT ANGIOGRAPHY HEAD AND NECK TECHNIQUE: Multidetector CT imaging of the head and neck was performed using the standard protocol during bolus administration of intravenous contrast. Multiplanar CT image reconstructions and MIPs were obtained to evaluate the vascular anatomy. Carotid stenosis measurements (when applicable) are obtained utilizing NASCET criteria, using the distal internal carotid diameter as the denominator. RADIATION DOSE REDUCTION: This exam was performed according to the departmental dose-optimization program which includes automated exposure control, adjustment of the mA and/or kV according to patient size and/or use of iterative reconstruction technique. CONTRAST:  125mL OMNIPAQUE IOHEXOL 350 MG/ML SOLN COMPARISON:  None. FINDINGS: CT HEAD FINDINGS Brain: Cerebral volume within normal limits for patient age. No evidence for acute intracranial hemorrhage. No findings to suggest acute large vessel territory infarct. No mass lesion, midline shift, or mass effect. Ventricles are normal in size without evidence for hydrocephalus. No extra-axial fluid collection identified. Vascular: No hyperdense vessel identified. Skull: Scalp soft tissues demonstrate no acute abnormality. Calvarium intact. Sinuses/Orbits: Globes and orbital  soft tissues within normal limits. Visualized paranasal sinuses are clear. No mastoid effusion.  CTA NECK FINDINGS Aortic arch: Visualized aortic arch normal caliber with normal branch pattern. No stenosis about the origin of the great vessels. Right carotid system: Right common and internal carotid arteries widely patent without stenosis, dissection or occlusion. Left carotid system: Left common and internal carotid arteries widely patent without stenosis, dissection or occlusion. Vertebral arteries: Both vertebral arteries arise from the subclavian arteries. No proximal subclavian artery stenosis. Both vertebral arteries widely patent without stenosis, dissection or occlusion. Skeleton: No discrete or worrisome osseous lesions. Other neck: No other acute soft tissue abnormality within the neck. Upper chest: Unremarkable. Review of the MIP images confirms the above findings CTA HEAD FINDINGS Anterior circulation: Both internal carotid arteries widely patent to the termini without stenosis. A1 segments widely patent. Normal anterior communicating artery complex. Both anterior cerebral arteries widely patent to their distal aspects without stenosis. No M1 stenosis or occlusion. Normal MCA bifurcations. Distal MCA branches well perfused and symmetric. Posterior circulation: Both V4 segments patent to the vertebrobasilar junction without stenosis. Both PICA patent. Basilar widely patent to its distal aspect without stenosis. Superior cerebral arteries patent bilaterally. Left PCA supplied via the basilar. Right PCA supplied via a hypoplastic right P1 segment and robust right posterior communicating artery. Both PCAs well perfused to their distal aspects. Venous sinuses: Patent allowing for timing the contrast bolus. Anatomic variants: None significant.  No aneurysm. Review of the MIP images confirms the above findings IMPRESSION: 1. Normal CTA of the head and neck. No large vessel occlusion, hemodynamically significant  stenosis, or other acute vascular abnormality. 2. No other acute intracranial abnormality. Electronically Signed   By: Rise Mu M.D.   On: 08/02/2021 21:59   DG Chest 2 View  Result Date: 08/02/2021 CLINICAL DATA:  Chest pain EXAM: CHEST - 2 VIEW COMPARISON:  None. FINDINGS: Heart size and mediastinal contours are within normal limits. No suspicious pulmonary opacities identified. No pleural effusion or pneumothorax visualized. No acute osseous abnormality appreciated. IMPRESSION: No acute intrathoracic process identified. Electronically Signed   By: Jannifer Hick M.D.   On: 08/02/2021 18:06   MR BRAIN WO CONTRAST  Result Date: 08/02/2021 CLINICAL DATA:  Initial evaluation for neuro deficit, stroke suspected. EXAM: MRI HEAD WITHOUT CONTRAST TECHNIQUE: Multiplanar, multiecho pulse sequences of the brain and surrounding structures were obtained without intravenous contrast. COMPARISON:  Prior study from earlier the same day. FINDINGS: Brain: Examination moderately degraded by motion artifact. Cerebral volume within normal limits. No focal parenchymal signal abnormality. No evidence for acute or subacute infarct. No encephalomalacia to suggest chronic cortical infarct or other insult. No acute or chronic intracranial blood products. No mass lesion or midline shift. No hydrocephalus or extra-axial fluid collection. Pituitary gland grossly within normal limits. Vascular: Major intracranial vascular flow voids are maintained. Skull and upper cervical spine: Craniocervical junction within normal limits. Bone marrow signal intensity normal. No scalp soft tissue abnormality. Sinuses/Orbits: Globes and orbital soft tissues within normal limits. Paranasal sinuses and mastoid air cells are clear. Other: None. IMPRESSION: Normal brain MRI.  No acute intracranial abnormality. Electronically Signed   By: Rise Mu M.D.   On: 08/02/2021 23:44   CT Angio Chest Aorta W and/or Wo Contrast  Result  Date: 08/02/2021 CLINICAL DATA:  Central chest pain. EXAM: CT ANGIOGRAPHY CHEST WITH CONTRAST TECHNIQUE: Multidetector CT imaging of the chest was performed using the standard protocol during bolus administration of intravenous contrast. Multiplanar CT image reconstructions and MIPs were obtained to evaluate the vascular anatomy. RADIATION DOSE REDUCTION: This exam  was performed according to the departmental dose-optimization program which includes automated exposure control, adjustment of the mA and/or kV according to patient size and/or use of iterative reconstruction technique. CONTRAST:  125mL OMNIPAQUE IOHEXOL 350 MG/ML SOLN COMPARISON:  None. FINDINGS: Cardiovascular: The thoracic aorta is normal in appearance, without evidence of aneurysmal dilatation or dissection. The pulmonary arteries are markedly limited in evaluation secondary to suboptimal opacification with intravenous contrast. Normal heart size. No pericardial effusion. Mediastinum/Nodes: No enlarged mediastinal, hilar, or axillary lymph nodes. Thyroid gland, trachea, and esophagus demonstrate no significant findings. Lungs/Pleura: Lungs are clear. No pleural effusion or pneumothorax. Upper Abdomen: No acute abnormality. Musculoskeletal: No chest wall abnormality. No acute or significant osseous findings. Other: The study is limited secondary to beam hardening artifact seen along the posterior aspect of chest and abdomen. Review of the MIP images confirms the above findings. IMPRESSION: 1. Markedly limited evaluation of the pulmonary arteries, as described above. As result, acute pulmonary embolism cannot be excluded. Correlation with follow-up chest CTA is recommended if pulmonary embolism is of clinical concern. 2. No evidence of acute cardiopulmonary disease. Electronically Signed   By: Aram Candelahaddeus  Houston M.D.   On: 08/02/2021 21:41     Assessment and Plan:   Chest pain Paroxysmal Afib in RVR  - chest pain in the setting of Afib RVR. HS trop  negative x 2. - patient reports 10 year h/o paroxysmal afib that seems difficult to rate control. He reports multiple hospitalizations for aFib RVR, most recently 2 weeks ago.  - Afib rates currently suboptimally controlled - continue PTA dilt 180mg  BID and Lopressor 100mg  BID - Continue PTA Eliquis, denies missing doses - Keep Mag>2 and K.4 - check TSH - UDS negative - echo ordered - Says cardiology was discussing ablation, may need to see EP  - plan to continue with rate control, Not a good candidate for many rhythm control medications  H/o TIA - reports prior h/o TIA - MRI brain with no acute abnormality  OSA - needs OP sleep study and likely CPAP - likely contributing to Afib  For questions or updates, please contact [facility] Please consult [email or web address] for contact info under    Signed, Tailer Volkert David StallH Jeannemarie Sawaya, PA-C  08/03/2021 12:24 PM

## 2021-08-03 NOTE — Progress Notes (Signed)
Chaplain Maggie made initial visit with pt to introduce spiritual care. Pt had expressed to his nurse that he wanted someone to talk to. When asked what's on his mind he said, "My mom and my dad". Pt shared stories about his family related to genealogy. He spoke about enjoying fishing and woodworking. Pt shared that he is feeling tired from a history of hospitalizations. He noted he is feeling depressed and therefore interested in connecting with psychological resources within the hospital. Talking with a psychiatrist, therapist and/or psychologist were expressed by pt related to his health care needs.Pt was very cordial and easy to converse with during our time of visitation. He described himself as an "old soul" as he shared stories of his family of origin and their history in [place] and [place]. ?

## 2021-08-03 NOTE — Progress Notes (Signed)
HR has been consistently in 120's-150's, 5 beat run VTach noted. Cardio made aware. PO metoprolol increased to 100 mg, orders to give a 75mg  tablet once now.  ?

## 2021-08-03 NOTE — Progress Notes (Signed)
[facility]  - [place] at [facility]   PATIENT NAME: Daniel [name]    MR#:  [id number]  DATE OF BIRTH:  11-12-1990  SUBJECTIVE:   patient came in with palpitation and chest tightness. Tells me his chronic pain. Recently moved from [place]. He is been eating pretty well per RN however complaining of pain all over. Denies shortness of breath. No family at bedside. Heart rate in the 90s.    VITALS:  Blood pressure 123/90, pulse 78, temperature (!) 97.5 F (36.4 C), resp. rate (!) 21, height 6\' 5"  (1.956 m), weight (!) 179.6 kg, SpO2 95 %.  PHYSICAL EXAMINATION:   GENERAL:  31 y.o.-year-old patient lying in the bed with no acute distress. Morbidly obese LUNGS: Normal breath sounds bilaterally, no wheezing, rales, rhonchi.  CARDIOVASCULAR: S1, S2 normal. No murmurs, rubs, or gallops. Tachycardia ABDOMEN: Soft, nontender, nondistended. Abdominal obesity  EXTREMITIES: No  edema b/l.    NEUROLOGIC: nonfocal  patient is alert and awake    LABORATORY PANEL:  CBC Recent Labs  Lab 08/02/21 1733  WBC 7.4  HGB 13.8  HCT 43.0  PLT 220    Chemistries  Recent Labs  Lab 08/02/21 1733 08/03/21 1307  NA 138  --   K 4.2  --   CL 98  --   CO2 32  --   GLUCOSE 94  --   BUN 15  --   CREATININE 0.80  --   CALCIUM 8.8*  --   MG  --  2.0   Cardiac Enzymes No results for input(s): TROPONINI in the last 168 hours. RADIOLOGY:  CT ANGIO HEAD NECK W WO CM  Result Date: 08/02/2021 CLINICAL DATA:  Initial evaluation for neuro deficit, stroke suspected. EXAM: CT ANGIOGRAPHY HEAD AND NECK TECHNIQUE: Multidetector CT imaging of the head and neck was performed using the standard protocol during bolus administration of intravenous contrast. Multiplanar CT image reconstructions and MIPs were obtained to evaluate the vascular anatomy. Carotid stenosis measurements (when applicable) are obtained utilizing NASCET criteria, using the distal internal carotid diameter  as the denominator. RADIATION DOSE REDUCTION: This exam was performed according to the departmental dose-optimization program which includes automated exposure control, adjustment of the mA and/or kV according to patient size and/or use of iterative reconstruction technique. CONTRAST:  OMNIPAQUE IOHEXOL 350 MG/ML SOLN COMPARISON:  None. FINDINGS: CT HEAD FINDINGS Brain: Cerebral volume within normal limits for patient age. No evidence for acute intracranial hemorrhage. No findings to suggest acute large vessel territory infarct. No mass lesion, midline shift, or mass effect. Ventricles are normal in size without evidence for hydrocephalus. No extra-axial fluid collection identified. Vascular: No hyperdense vessel identified. Skull: Scalp soft tissues demonstrate no acute abnormality. Calvarium intact. Sinuses/Orbits: Globes and orbital soft tissues within normal limits. Visualized paranasal sinuses are clear. No mastoid effusion. CTA NECK FINDINGS Aortic arch: Visualized aortic arch normal caliber with normal branch pattern. No stenosis about the origin of the great vessels. Right carotid system: Right common and internal carotid arteries widely patent without stenosis, dissection or occlusion. Left carotid system: Left common and internal carotid arteries widely patent without stenosis, dissection or occlusion. Vertebral arteries: Both vertebral arteries arise from the subclavian arteries. No proximal subclavian artery stenosis. Both vertebral arteries widely patent without stenosis, dissection or occlusion. Skeleton: No discrete or worrisome osseous lesions. Other neck: No other acute soft tissue abnormality within the neck. Upper chest: Unremarkable. Review of the MIP images confirms the above findings CTA HEAD  FINDINGS Anterior circulation: Both internal carotid arteries widely patent to the termini without stenosis. A1 segments widely patent. Normal anterior communicating artery complex. Both anterior  cerebral arteries widely patent to their distal aspects without stenosis. No M1 stenosis or occlusion. Normal MCA bifurcations. Distal MCA branches well perfused and symmetric. Posterior circulation: Both V4 segments patent to the vertebrobasilar junction without stenosis. Both PICA patent. Basilar widely patent to its distal aspect without stenosis. Superior cerebral arteries patent bilaterally. Left PCA supplied via the basilar. Right PCA supplied via a hypoplastic right P1 segment and robust right posterior communicating artery. Both PCAs well perfused to their distal aspects. Venous sinuses: Patent allowing for timing the contrast bolus. Anatomic variants: None significant.  No aneurysm. Review of the MIP images confirms the above findings IMPRESSION: 1. Normal CTA of the head and neck. No large vessel occlusion, hemodynamically significant stenosis, or other acute vascular abnormality. 2. No other acute intracranial abnormality. Electronically Signed   By: Rise Mu M.D.   On: 08/02/2021 21:59   DG Chest 2 View  Result Date: 08/02/2021 CLINICAL DATA:  Chest pain EXAM: CHEST - 2 VIEW COMPARISON:  None. FINDINGS: Heart size and mediastinal contours are within normal limits. No suspicious pulmonary opacities identified. No pleural effusion or pneumothorax visualized. No acute osseous abnormality appreciated. IMPRESSION: No acute intrathoracic process identified. Electronically Signed   By: Jannifer Hick M.D.   On: 08/02/2021 18:06   MR BRAIN WO CONTRAST  Result Date: 08/02/2021 CLINICAL DATA:  Initial evaluation for neuro deficit, stroke suspected. EXAM: MRI HEAD WITHOUT CONTRAST TECHNIQUE: Multiplanar, multiecho pulse sequences of the brain and surrounding structures were obtained without intravenous contrast. COMPARISON:  Prior study from earlier the same day. FINDINGS: Brain: Examination moderately degraded by motion artifact. Cerebral volume within normal limits. No focal parenchymal  signal abnormality. No evidence for acute or subacute infarct. No encephalomalacia to suggest chronic cortical infarct or other insult. No acute or chronic intracranial blood products. No mass lesion or midline shift. No hydrocephalus or extra-axial fluid collection. Pituitary gland grossly within normal limits. Vascular: Major intracranial vascular flow voids are maintained. Skull and upper cervical spine: Craniocervical junction within normal limits. Bone marrow signal intensity normal. No scalp soft tissue abnormality. Sinuses/Orbits: Globes and orbital soft tissues within normal limits. Paranasal sinuses and mastoid air cells are clear. Other: None. IMPRESSION: Normal brain MRI.  No acute intracranial abnormality. Electronically Signed   By: Rise Mu M.D.   On: 08/02/2021 23:44   CT Angio Chest Aorta W and/or Wo Contrast  Result Date: 08/02/2021 CLINICAL DATA:  Central chest pain. EXAM: CT ANGIOGRAPHY CHEST WITH CONTRAST TECHNIQUE: Multidetector CT imaging of the chest was performed using the standard protocol during bolus administration of intravenous contrast. Multiplanar CT image reconstructions and MIPs were obtained to evaluate the vascular anatomy. RADIATION DOSE REDUCTION: This exam was performed according to the departmental dose-optimization program which includes automated exposure control, adjustment of the mA and/or kV according to patient size and/or use of iterative reconstruction technique. CONTRAST:  OMNIPAQUE IOHEXOL 350 MG/ML SOLN COMPARISON:  None. FINDINGS: Cardiovascular: The thoracic aorta is normal in appearance, without evidence of aneurysmal dilatation or dissection. The pulmonary arteries are markedly limited in evaluation secondary to suboptimal opacification with intravenous contrast. Normal heart size. No pericardial effusion. Mediastinum/Nodes: No enlarged mediastinal, hilar, or axillary lymph nodes. Thyroid gland, trachea, and esophagus demonstrate no  significant findings. Lungs/Pleura: Lungs are clear. No pleural effusion or pneumothorax. Upper Abdomen: No acute abnormality. Musculoskeletal: No  chest wall abnormality. No acute or significant osseous findings. Other: The study is limited secondary to beam hardening artifact seen along the posterior aspect of chest and abdomen. Review of the MIP images confirms the above findings. IMPRESSION: 1. Markedly limited evaluation of the pulmonary arteries, as described above. As result, acute pulmonary embolism cannot be excluded. Correlation with follow-up chest CTA is recommended if pulmonary embolism is of clinical concern. 2. No evidence of acute cardiopulmonary disease. Electronically Signed   By: Aram Candela M.D.   On: 08/02/2021 21:41    Assessment and Plan  Daniel [name] is a 31 y.o. male with medical history significant for A-fib on Eliquis, class III obesity, prior TIA who presents to the ED with chest pain, headache, left-sided weakness ongoing for the past few days.  States his chest pain is mid chest radiating to the left arm.  His left-sided weakness started a day prior and is associated with a headache  MRI, CTA head and neck all negative for stroke   a fib with RVR -- seen by cardiology [facility] -- continue diltiazem CR 240 mg daily -- Lopressor increased to 100 milligrams BID -- continue eliquis -? EP -- troponin negative  TIA -- left-sided weakness resolved -- MRI negative for stroke  morbid obesity with sleep apnea -- continue CPAP at night  chronic anticoagulation -- continue eliquis  history of psych disorder likely schizophrenia -- continue invega Depakote  chronic pain -- PRN ibuprofen     Procedures: Family communication :none Consults :[facility] cardiology CODE STATUS: full DVT Prophylaxis :eliquis Level of care: Progressive Status is: Inpatient Remains inpatient appropriate because: afib    TOTAL TIME TAKING CARE OF THIS PATIENT: 25 minutes.  >50%  time spent on counselling and coordination of care  Note: This dictation was prepared with Dragon dictation along with smaller phrase technology. Any transcriptional errors that result from this process are unintentional.  Enedina Finner M.D    [facility]   CC: Primary care physician; Pcp, No

## 2021-08-03 NOTE — Progress Notes (Signed)
Chaplain asked to come to bedside as patient stated he wanted someone to talk to and that his mind was racing.  ?

## 2021-08-03 NOTE — Progress Notes (Signed)
Patient states is awaiting sleep study. States does wear home o2. Cpap set to 10 per protocol. O2 applied with cpap at 3 liters ?

## 2021-08-04 ENCOUNTER — Inpatient Hospital Stay (HOSPITAL_COMMUNITY)
Admit: 2021-08-04 | Discharge: 2021-08-04 | Disposition: A | Discharge status: Home / Self Care | Payer: Medicaid Other | Attending: Medical | Admitting: Medical

## 2021-08-04 DIAGNOSIS — I4819 Other persistent atrial fibrillation: Secondary | ICD-10-CM

## 2021-08-04 DIAGNOSIS — R531 Weakness: Secondary | ICD-10-CM | POA: Diagnosis not present

## 2021-08-04 DIAGNOSIS — R079 Chest pain, unspecified: Secondary | ICD-10-CM

## 2021-08-04 LAB — ECHOCARDIOGRAM COMPLETE
Height: 77 in
S' Lateral: 3 cm
Weight: 6336 oz

## 2021-08-04 MED ORDER — DILTIAZEM HCL ER COATED BEADS 180 MG PO CP24
300.0000 mg | ORAL_CAPSULE | Freq: Every day | ORAL | Status: DC
  Administered 2021-08-04: 300 mg via ORAL
  Filled 2021-08-04: qty 1

## 2021-08-04 MED ORDER — DILTIAZEM HCL ER COATED BEADS 300 MG PO CP24: 300.0000 mg | ORAL_CAPSULE | Freq: Every day | ORAL | 1 refills | Status: DC

## 2021-08-04 NOTE — Progress Notes (Signed)
Nutrition Brief Note ? ?Pt seen per request of SLP.  ? ?Wt Readings from Last 15 Encounters:  ?08/02/21 (!) 179.6 kg  ? ?Daniel [name] is a 31 y.o. male with medical history significant for A-fib on Eliquis, class III obesity, prior TIA who presents to the ED with chest pain, headache, left-sided weakness ongoing for the past few days.  States his chest pain is mid chest radiating to the left arm.  His left-sided weakness started a day prior and is associated with a headache ? ?Pt admitted with a-fib.  ? ?Pt resides in a group home PTA. ? ?Per discussion with SLP, pt continues to request snacks and multiple food items.  ? ?Pt working with mobility teach at time of visit.  ? ?Nutrition-Focused physical exam completed. Findings are no fat depletion, no muscle depletion, and mild edema.  ? ?RD will liberalize diet to 2 gram sodium for wider variety of meal selections and add double protein with meals to assist with satiety.   ? ?Body mass index is 46.96 kg/m?[name] Patient meets criteria for extreme obesity, class III based on current BMI. Obesity is a complex, chronic medical condition that is optimally managed by a multidisciplinary care team. Weight loss is not an ideal goal for an acute inpatient hospitalization. However, if further work-up for obesity is warranted, consider outpatient referral to outpatient bariatric service and/or [facility].   ? ?Current diet order is Heart Healthy, patient is consuming approximately 100% of meals at this time. Labs and medications reviewed.  ? ?No nutrition interventions warranted at this time. If nutrition issues arise, please consult RD.  ? ?Loistine Chance, RD, LDN, CDCES ?Registered Dietitian II ?Certified Diabetes Care and Education Specialist ?Please refer to [facility] for RD and/or RD on-call/weekend/after hours pager   ?

## 2021-08-04 NOTE — Plan of Care (Signed)

## 2021-08-04 NOTE — Progress Notes (Signed)
*  PRELIMINARY RESULTS* ?Echocardiogram ?2D Echocardiogram has been performed. ? ?Glender Augusta, Dorene Sorrow ?08/04/2021, 10:53 AM ?

## 2021-08-04 NOTE — Progress Notes (Signed)
Mobility Specialist - Progress Note ? ? ? 08/04/21 1300  ?Mobility  ?Activity Ambulated with assistance in hallway  ?Level of Assistance Contact guard assist, steadying assist  ?Assistive Device None  ?Distance Ambulated (ft) 100 ft  ?Activity Response Tolerated fair  ?$Mobility charge 1 Mobility  ? ? ?Pre-mobility:89 HR, BP, 93% SpO2 ?During mobility: 122 HR, BP, 97%SpO2 ?Post-mobility: 85 HR, BP, 93% SPO2 ? ?Pt standing at door upon arrival utilizing RA, session encouraged by RN after Pt request to walk. Pt denies any pain or dizziness prior to ambulation. Ambulates 166ft -- mobility initiated rest break d/t signs of dizziness. Pt voices dizziness, chest pain 7/10 and "whooziness". HR jumps to 122 but returns to 85. Called for RN assist for seated rest break. Pt voices concern of "a-fib cardiac spell" and claims to see "stars." Rolled pt to room to return to bed. RN called for 3L [place] after request from pt. Pt left with needs in reach. ? ?Clarisa Schools ?Mobility Specialist ?08/04/21, 1:44 PM ? ? ?

## 2021-08-04 NOTE — Progress Notes (Signed)
Patient discharge to group home, caregiver Kevin Fenton picked him up. Discharge papers work including medications and follow ups given to [place]. Patient left floor via wheelchair accompanied by staff no c/o pain or shortness of breath at d/c. ? ?Chriselda Leppert, Kae Heller, RN ? ?

## 2021-08-04 NOTE — Plan of Care (Signed)

## 2021-08-04 NOTE — Progress Notes (Signed)
Mode changed on respiratory device from cpap to bipap auto to allow titration of pressures set from ipap max 20 to epap min 5 as patient states he has not had a sleep study. 3 liters o2 remains bleed in.  ?

## 2021-08-04 NOTE — TOC Initial Note (Signed)
Transition of Care (TOC) - Initial/Assessment Note  ? ? ?Patient Details  ?Name: Daniel [name] ?MRN: [id number] ?Date of Birth: [birth year] ? ?Transition of Care (TOC) CM/SW Contact:    ?Alberteen Sam, LCSW ?Phone Number: ?08/04/2021, 10:23 AM ? ?Clinical Narrative:                 ? ?CSW met with patient at bedside, confirmed being from [place] and having a legal guardian.  ? ?CSW spoke with [facility] at [phone]. They report they provide transportation for patient to and from appointments and aid in medication assistance.  ? ?They report to call Awanda Mink at [phone] when patient is medically stable to discharge and they will provide transportation back to [facility] located at: ?[address]  ?[place] [address] ? ?TOC will continue to follow for any additional discharge needs.  ? ?Expected Discharge Plan: Group Home ?Barriers to Discharge: Continued Medical Work up ? ? ?Patient Goals and CMS Choice ?Patient states their goals for this hospitalization and ongoing recovery are:: to go home ?CMS Medicare.gov Compare Post Acute Care list provided to:: Patient ?Choice offered to / list presented to : Patient ? ?Expected Discharge Plan and Services ?Expected Discharge Plan: Group Home ?  ?  ?  ?Living arrangements for the past 2 months: Group Home ?                ?  ?  ?  ?  ?  ?  ?  ?  ?  ?  ? ?Prior Living Arrangements/Services ?Living arrangements for the past 2 months: Group Home ?Lives with:: Facility Resident ?  ?       ?  ?  ?  ?  ? ?Activities of Daily Living ?Home Assistive Devices/Equipment: CPAP ?ADL Screening (condition at time of admission) ?Patient's cognitive ability adequate to safely complete daily activities?: Yes ?Is the patient deaf or have difficulty hearing?: No ?Does the patient have difficulty seeing, even when wearing glasses/contacts?: Yes ?Does the patient have difficulty concentrating, remembering, or making decisions?:  No ?Patient able to express need for assistance with ADLs?: Yes ?Does the patient have difficulty dressing or bathing?: Yes ?Independently performs ADLs?: No ?Communication: Independent ?Does the patient have difficulty walking or climbing stairs?: Yes ?Weakness of Legs: Both ?Weakness of Arms/Hands: Both ? ?Permission Sought/Granted ?  ?  ?   ?   ?   ?   ? ?Emotional Assessment ?  ?  ?  ?Orientation: : Oriented to Self, Oriented to Place, Oriented to  Time, Oriented to Situation ?Alcohol / Substance Use: Not Applicable ?Psych Involvement: No (comment) ? ?Admission diagnosis:  Weakness [R53.1] ?Anticoagulated [Z79.01] ?Rapid atrial fibrillation ([place]) [I48.91] ?Left-sided weakness [R53.1] ?Atrial fibrillation, unspecified type ([place]) [I48.91] ?Chest pain, unspecified type [R07.9] ?Patient Active Problem List  ? Diagnosis Date Noted  ? Left-sided weakness 08/02/2021  ? Obesity, Class III, BMI 40-49.9 (morbid obesity) ([place]) 08/02/2021  ? Atrial fibrillation, chronic ([place]) 08/02/2021  ? Chronic anticoagulation 08/02/2021  ? Headache 08/02/2021  ? Chest pain 08/02/2021  ? ?PCP:  Pcp, No ?Pharmacy:   ?[facility], [address] ?[place] [address] ?Phone: [phone] Fax: [phone] ? ? ? ? ?Social Determinants of Health (SDOH) Interventions ?  ? ?Readmission Risk Interventions ?No flowsheet data found. ? ? ?

## 2021-08-04 NOTE — Discharge Instructions (Addendum)
Wear your oxygen as per  your previous instructions ?patient to follow-up with his PCP that has been assigned through his group ?

## 2021-08-04 NOTE — Consult Note (Signed)
Electrophysiology consultation:   Patient ID: Daniel [name] MRN: [id number]; DOB: 1990/06/05  Admit date: 08/02/2021 Date of Consult: 08/04/2021  PCP:  Oneita HurtPcp, No   [facility] Providers Cardiologist:  None        Patient Profile:   Daniel [name] is a 31 y.o. male with a hx of persistent atrial fibrillation, morbid obesity, hypertension, hyperlipidemia, obstructive sleep apnea who is being seen 08/04/2021 for the evaluation of atrial fibrillation with rapid ventricular rates at the request of Dr. Okey DupreEnd.  History of Present Illness:   [name] was admitted to the hospital August 02, 2021 with atrial fibrillation and rapid ventricular rates.  [place] [facility] [place]  He was being seen there for the same conditions.  He tells me that in the past he has been seen by other hospitals but the other hospitals have "let him go".  He lives at a group home.  He is prescribed daily antipsychotics.  He tells me that he is in and out of hospitals frequently.  He tells me he goes to the hospital as much is every other weekend.  He self titrates his medications at home without medical guidance.   Past Medical History:  Diagnosis Date   Atrial fibrillation (HCC)    Current use of long term anticoagulation    Diabetes mellitus type 2, uncomplicated (HCC)    Hyperlipidemia    Hypertension    OSA (obstructive sleep apnea)    TIA (transient ischemic attack)     History reviewed. No pertinent surgical history.    Inpatient Medications: Scheduled Meds:  apixaban  5 mg Oral BID   atorvastatin  80 mg Oral q1800   baclofen  10 mg Oral BID   cyclobenzaprine  5 mg Oral TID   diltiazem  240 mg Oral Daily   divalproex  1,000 mg Oral q morning   divalproex  1,500 mg Oral QHS   escitalopram  10 mg Oral Daily   famotidine  20 mg Oral Daily   fluticasone  2 spray Each Nare BID   hydrOXYzine  50 mg Oral TID   metoprolol tartrate  100 mg Oral BID    paliperidone  9 mg Oral q morning   Continuous Infusions:  PRN Meds: acetaminophen **OR** acetaminophen, ibuprofen, ondansetron **OR** ondansetron (ZOFRAN) IV  Allergies:    Allergies  Allergen Reactions   Haldol [Haloperidol] Other (See Comments)    SI   Abilify [Aripiprazole] Palpitations   Demerol [Meperidine Hcl] Hives    Social History:   Social History   Socioeconomic History   Marital status: Single    Spouse name: Not on file   Number of children: Not on file   Years of education: Not on file   Highest education level: Not on file  Occupational History   Not on file  Tobacco Use   Smoking status: Former    Types: Cigarettes   Smokeless tobacco: Never  Substance and Sexual Activity   Alcohol use: Not Currently   Drug use: Not on file   Sexual activity: Not on file  Other Topics Concern   Not on file  Social History Narrative   Not on file   Social Determinants of Health   Financial Resource Strain: Not on file  Food Insecurity: Not on file  Transportation Needs: Not on file  Physical Activity: Not on file  Stress: Not on file  Social Connections: Not on file  Intimate Partner Violence: Not on  file    Family History:   History reviewed. No pertinent family history.   ROS:  Please see the history of present illness.   All other ROS reviewed and negative.     Physical Exam/Data:   Vitals:   08/03/21 2351 08/04/21 0433 08/04/21 0435 08/04/21 0800  BP: (!) 97/49 (!) 82/55 (!) 149/99 116/85  Pulse: 92 94 64 (!) 102  Resp: Temp: 98.4 F (36.9 C) 98.4 F (36.9 C)  97.8 F (36.6 C)  TempSrc:      SpO2: 98% 90% 98% 93%  Weight:      Height:       No intake or output data in the 24 hours ending 08/04/21 0802 Last 3 Weights 08/02/2021  Weight (lbs) 396 lb  Weight (kg) 179.624 kg     Body mass index is 46.96 kg/m.  General: Chronically ill-appearing on CPAP.  Morbidly obese.   HEENT: normal.  On CPAP mask. Neck: Unable to assess  JVD because of body habitus Vascular: No carotid bruits; Distal pulses 2+ bilaterally Cardiac: Irregularly irregular, distant heart sounds.  No obvious murmurs.   Lungs:  clear to auscultation bilaterally, no wheezing, rhonchi or rales.  CPAP Abd: soft, nontender, no hepatomegaly.  Morbidly obese Ext: no edema Musculoskeletal:  No deformities, BUE and BLE strength normal and equal Skin: warm and dry  Neuro:  CNs 2-12 intact, no focal abnormalities noted Psych:  Normal affect   EKG:  The EKG was personally reviewed and demonstrates: A-fib Telemetry:  Telemetry was personally reviewed and demonstrates: Atrial fibrillation with average ventricular rates in the 90s.  Relevant CV Studies: Echo pending  Laboratory Data:  High Sensitivity Troponin:   Recent Labs  Lab 08/02/21 1733 08/02/21 2027  TROPONINIHS 3 4     Chemistry Recent Labs  Lab 08/02/21 1733 08/03/21 1307  NA 138  --   K 4.2  --   CL 98  --   CO2 32  --   GLUCOSE 94  --   BUN 15  --   CREATININE 0.80  --   CALCIUM 8.8*  --   MG  --  2.0  GFRNONAA >60  --   ANIONGAP 8  --     No results for input(s): PROT, ALBUMIN, AST, ALT, ALKPHOS, BILITOT in the last 168 hours. Lipids No results for input(s): CHOL, TRIG, HDL, LABVLDL, LDLCALC, CHOLHDL in the last 168 hours.  Hematology Recent Labs  Lab 08/02/21 1733  WBC 7.4  RBC 4.97  HGB 13.8  HCT 43.0  MCV 86.5  MCH 27.8  MCHC 32.1  RDW 13.3  PLT 220   Thyroid  Recent Labs  Lab 08/03/21 1307  TSH 1.346    BNPNo results for input(s): BNP, PROBNP in the last 168 hours.  DDimer No results for input(s): DDIMER in the last 168 hours.   Radiology/Studies:  CT ANGIO HEAD NECK W WO CM  Result Date: 08/02/2021 CLINICAL DATA:  Initial evaluation for neuro deficit, stroke suspected. EXAM: CT ANGIOGRAPHY HEAD AND NECK TECHNIQUE: Multidetector CT imaging of the head and neck was performed using the standard protocol during bolus administration of intravenous  contrast. Multiplanar CT image reconstructions and MIPs were obtained to evaluate the vascular anatomy. Carotid stenosis measurements (when applicable) are obtained utilizing NASCET criteria, using the distal internal carotid diameter as the denominator. RADIATION DOSE REDUCTION: This exam was performed according to the departmental dose-optimization program which includes automated exposure control, adjustment of the  mA and/or kV according to patient size and/or use of iterative reconstruction technique. CONTRAST:  OMNIPAQUE IOHEXOL 350 MG/ML SOLN COMPARISON:  None. FINDINGS: CT HEAD FINDINGS Brain: Cerebral volume within normal limits for patient age. No evidence for acute intracranial hemorrhage. No findings to suggest acute large vessel territory infarct. No mass lesion, midline shift, or mass effect. Ventricles are normal in size without evidence for hydrocephalus. No extra-axial fluid collection identified. Vascular: No hyperdense vessel identified. Skull: Scalp soft tissues demonstrate no acute abnormality. Calvarium intact. Sinuses/Orbits: Globes and orbital soft tissues within normal limits. Visualized paranasal sinuses are clear. No mastoid effusion. CTA NECK FINDINGS Aortic arch: Visualized aortic arch normal caliber with normal branch pattern. No stenosis about the origin of the great vessels. Right carotid system: Right common and internal carotid arteries widely patent without stenosis, dissection or occlusion. Left carotid system: Left common and internal carotid arteries widely patent without stenosis, dissection or occlusion. Vertebral arteries: Both vertebral arteries arise from the subclavian arteries. No proximal subclavian artery stenosis. Both vertebral arteries widely patent without stenosis, dissection or occlusion. Skeleton: No discrete or worrisome osseous lesions. Other neck: No other acute soft tissue abnormality within the neck. Upper chest: Unremarkable. Review of the MIP images  confirms the above findings CTA HEAD FINDINGS Anterior circulation: Both internal carotid arteries widely patent to the termini without stenosis. A1 segments widely patent. Normal anterior communicating artery complex. Both anterior cerebral arteries widely patent to their distal aspects without stenosis. No M1 stenosis or occlusion. Normal MCA bifurcations. Distal MCA branches well perfused and symmetric. Posterior circulation: Both V4 segments patent to the vertebrobasilar junction without stenosis. Both PICA patent. Basilar widely patent to its distal aspect without stenosis. Superior cerebral arteries patent bilaterally. Left PCA supplied via the basilar. Right PCA supplied via a hypoplastic right P1 segment and robust right posterior communicating artery. Both PCAs well perfused to their distal aspects. Venous sinuses: Patent allowing for timing the contrast bolus. Anatomic variants: None significant.  No aneurysm. Review of the MIP images confirms the above findings IMPRESSION: 1. Normal CTA of the head and neck. No large vessel occlusion, hemodynamically significant stenosis, or other acute vascular abnormality. 2. No other acute intracranial abnormality. Electronically Signed   By: Rise Mu M.D.   On: 08/02/2021 21:59   DG Chest 2 View  Result Date: 08/02/2021 CLINICAL DATA:  Chest pain EXAM: CHEST - 2 VIEW COMPARISON:  None. FINDINGS: Heart size and mediastinal contours are within normal limits. No suspicious pulmonary opacities identified. No pleural effusion or pneumothorax visualized. No acute osseous abnormality appreciated. IMPRESSION: No acute intrathoracic process identified. Electronically Signed   By: Jannifer Hick M.D.   On: 08/02/2021 18:06   MR BRAIN WO CONTRAST  Result Date: 08/02/2021 CLINICAL DATA:  Initial evaluation for neuro deficit, stroke suspected. EXAM: MRI HEAD WITHOUT CONTRAST TECHNIQUE: Multiplanar, multiecho pulse sequences of the brain and surrounding  structures were obtained without intravenous contrast. COMPARISON:  Prior study from earlier the same day. FINDINGS: Brain: Examination moderately degraded by motion artifact. Cerebral volume within normal limits. No focal parenchymal signal abnormality. No evidence for acute or subacute infarct. No encephalomalacia to suggest chronic cortical infarct or other insult. No acute or chronic intracranial blood products. No mass lesion or midline shift. No hydrocephalus or extra-axial fluid collection. Pituitary gland grossly within normal limits. Vascular: Major intracranial vascular flow voids are maintained. Skull and upper cervical spine: Craniocervical junction within normal limits. Bone marrow signal intensity normal. No scalp soft tissue  abnormality. Sinuses/Orbits: Globes and orbital soft tissues within normal limits. Paranasal sinuses and mastoid air cells are clear. Other: None. IMPRESSION: Normal brain MRI.  No acute intracranial abnormality. Electronically Signed   By: Rise Mu M.D.   On: 08/02/2021 23:44   CT Angio Chest Aorta W and/or Wo Contrast  Result Date: 08/02/2021 CLINICAL DATA:  Central chest pain. EXAM: CT ANGIOGRAPHY CHEST WITH CONTRAST TECHNIQUE: Multidetector CT imaging of the chest was performed using the standard protocol during bolus administration of intravenous contrast. Multiplanar CT image reconstructions and MIPs were obtained to evaluate the vascular anatomy. RADIATION DOSE REDUCTION: This exam was performed according to the departmental dose-optimization program which includes automated exposure control, adjustment of the mA and/or kV according to patient size and/or use of iterative reconstruction technique. CONTRAST:  OMNIPAQUE IOHEXOL 350 MG/ML SOLN COMPARISON:  None. FINDINGS: Cardiovascular: The thoracic aorta is normal in appearance, without evidence of aneurysmal dilatation or dissection. The pulmonary arteries are markedly limited in evaluation secondary to  suboptimal opacification with intravenous contrast. Normal heart size. No pericardial effusion. Mediastinum/Nodes: No enlarged mediastinal, hilar, or axillary lymph nodes. Thyroid gland, trachea, and esophagus demonstrate no significant findings. Lungs/Pleura: Lungs are clear. No pleural effusion or pneumothorax. Upper Abdomen: No acute abnormality. Musculoskeletal: No chest wall abnormality. No acute or significant osseous findings. Other: The study is limited secondary to beam hardening artifact seen along the posterior aspect of chest and abdomen. Review of the MIP images confirms the above findings. IMPRESSION: 1. Markedly limited evaluation of the pulmonary arteries, as described above. As result, acute pulmonary embolism cannot be excluded. Correlation with follow-up chest CTA is recommended if pulmonary embolism is of clinical concern. 2. No evidence of acute cardiopulmonary disease. Electronically Signed   By: Aram Candela M.D.   On: 08/02/2021 21:41     Assessment and Plan:   Mr. Sheridan is a 31 year old man with multiple medical comorbidities including morbid obesity, obstructive sleep apnea not being treated who I am seeing for symptomatic persistent atrial fibrillation with rapid ventricular rates.  He is now in the hospital being provided his medications and his ventricular rates are much improved.  He is actually not even on a calcium channel blocker right now and his rates have improved down to the 90s on average.  I suspect there is a large component of medication nonadherence contributing to his frequent hospitalizations.  He is not a candidate for invasive EP procedures.  He is not a candidate for ablation.  He is not a candidate for antiarrhythmic drug therapy given his antipsychotic medications and reported self titration of drug therapy.  I would recommend rate control as you are already doing.  In an effort to maximize the chances of medication regimen adherence, favor a drug regimen  with metoprolol only as long as his ventricular rates stay in the 90s.  Outpatient sleep study recommended.  Weight loss encouraged.  In summary: -Continue metoprolol with titration to achieve average ventricular rates in the 90s or less -Continue Eliquis - Echo pending -Outpatient sleep study -Weight loss  Follow-up with cardiology as an outpatient.   For questions or updates, please contact [facility] Please consult [email or web address] for contact info under    Signed, Lanier Prude, MD  08/04/2021 8:02 AM

## 2021-08-04 NOTE — Discharge Summary (Signed)
Physician Discharge Summary   Patient: Daniel [name] MRN: [id number] DOB: 26-Jun-1990  Admit date:     08/02/2021  Discharge date: 08/04/21  Discharge Physician: Enedina Finner   PCP: Pcp, No   Recommendations at discharge:   use your oxygen per your previous instruction follow-up with your primary care physician in the area assigned through your group home/insurance follow-up with Dr. Lalla Brothers cardiology [facility] in 1 to 2 weeks  Discharge Diagnoses: acute on chronic a fib  Hospital Course:  Daniel [name] is a 31 y.o. male with medical history significant for A-fib on Eliquis, class III obesity, prior TIA who presents to the ED with chest pain, headache, left-sided weakness ongoing for the past few days.  States his chest pain is mid chest radiating to the left arm.  His left-sided weakness started a day prior and is associated with a headache   MRI, CTA head and neck all negative for stroke     a fib with RVR -- seen by cardiology [facility] -- increased diltiazem CR two 300 mg daily -- Lopressor increased to 100 milligrams BID -- continue eliquis -Seen by Dr Lalla Brothers --[facility] cardiology-- patient is not a candidate for any invasive cardiac procedure, not a candidate for ablation, not a candidate for antiarrhythmic drug therapy given his antipsychotic meds -- troponin negative -- echo done results pending -- patient can follow-up with Dr. Lalla Brothers as outpatient -- patient advised to adhere to medications -- heart rate remains mainly in the 90s. Occasionally jumps to 100's -- patient ambulated in the hallway   TIA -- left-sided weakness resolved -- MRI negative for stroke   morbid obesity with sleep apnea -- continue CPAP at night -- patient advised to continue his oxygen as before   chronic anticoagulation -- continue eliquis   history of psychotic disorder (no old records available) -- continue invega Depakote   chronic pain -- PRN ibuprofen   overall remains about the same  and stable. Will discharged to group home with outpatient follow-up cardiology patient is in agreement. Discussed with [facility] staff       Consultants: [facility] cardiology Procedures performed: none Disposition:  group home Diet recommendation: cardiac diet Discharge Diet Orders (From admission, onward)     Start     Ordered   08/04/21 0000  Diet - low sodium heart healthy        08/04/21 1342           Cardiac diet DISCHARGE MEDICATION: Allergies as of 08/04/2021       Reactions   Haldol [haloperidol] Other (See Comments)   SI   Abilify [aripiprazole] Palpitations   Demerol [meperidine Hcl] Hives        Medication List     STOP taking these medications    cephALEXin 500 MG capsule Commonly known as: KEFLEX   dicyclomine 10 MG capsule Commonly known as: BENTYL   diltiazem 90 MG 12 hr capsule Commonly known as: CARDIZEM SR   famotidine 20 MG tablet Commonly known as: PEPCID   ibuprofen 800 MG tablet Commonly known as: ADVIL       TAKE these medications    albuterol 108 (90 Base) MCG/ACT inhaler Commonly known as: VENTOLIN HFA Inhale 2 puffs into the lungs every 6 (six) hours as needed for wheezing.   allopurinol 100 MG tablet Commonly known as: ZYLOPRIM Take 100 mg by mouth daily.   aspirin 325 MG tablet Take 325 mg by mouth every morning.   atorvastatin  80 MG tablet Commonly known as: LIPITOR Take 80 mg by mouth daily.   baclofen 10 MG tablet Commonly known as: LIORESAL Take 10 mg by mouth 2 (two) times daily.   cyclobenzaprine 5 MG tablet Commonly known as: FLEXERIL Take 5 mg by mouth 3 (three) times daily.   diltiazem 300 MG 24 hr capsule Commonly known as: Cartia XT Take 1 capsule (300 mg total) by mouth daily. Start taking on: August 05, 2021 What changed:  medication strength how much to take   divalproex 500 MG DR tablet Commonly known as: DEPAKOTE Take 1,000-1,500 mg by mouth 2 (two) times daily. 1000 mg every  morning and 1500 mg at bedtime   Eliquis 5 MG Tabs tablet Generic drug: apixaban Take 5 mg by mouth 2 (two) times daily.   escitalopram 10 MG tablet Commonly known as: LEXAPRO Take 10 mg by mouth daily.   fluticasone 50 MCG/ACT nasal spray Commonly known as: FLONASE Place 2 sprays into both nostrils 2 (two) times daily.   hydrochlorothiazide 25 MG tablet Commonly known as: HYDRODIURIL Take 25 mg by mouth every morning.   hydrOXYzine 50 MG tablet Commonly known as: ATARAX Take 25-50 mg by mouth 3 (three) times daily. 50 mg every morning and at bedtime. 25 mg at noon   Invega 9 MG 24 hr tablet Generic drug: paliperidone Take 9 mg by mouth every morning. What changed: Another medication with the same name was removed. Continue taking this medication, and follow the directions you see here.   loratadine 10 MG tablet Commonly known as: CLARITIN Take 10 mg by mouth daily.   melatonin 3 MG Tabs tablet Take 3 mg by mouth at bedtime.   metoprolol tartrate 100 MG tablet Commonly known as: LOPRESSOR Take 100 mg by mouth 2 (two) times daily.   omeprazole 20 MG capsule Commonly known as: PRILOSEC Take 20 mg by mouth daily.   promethazine 25 MG tablet Commonly known as: PHENERGAN Take 25 mg by mouth every 6 (six) hours as needed for nausea or vomiting.        Discharge Exam: Filed Weights   08/02/21 1730  Weight: (!) 179.6 kg     Condition at discharge: fair  The results of significant diagnostics from this hospitalization (including imaging, microbiology, ancillary and laboratory) are listed below for reference.   Imaging Studies: CT ANGIO HEAD NECK W WO CM  Result Date: 08/02/2021 CLINICAL DATA:  Initial evaluation for neuro deficit, stroke suspected. EXAM: CT ANGIOGRAPHY HEAD AND NECK TECHNIQUE: Multidetector CT imaging of the head and neck was performed using the standard protocol during bolus administration of intravenous contrast. Multiplanar CT image  reconstructions and MIPs were obtained to evaluate the vascular anatomy. Carotid stenosis measurements (when applicable) are obtained utilizing NASCET criteria, using the distal internal carotid diameter as the denominator. RADIATION DOSE REDUCTION: This exam was performed according to the departmental dose-optimization program which includes automated exposure control, adjustment of the mA and/or kV according to patient size and/or use of iterative reconstruction technique. CONTRAST:  OMNIPAQUE IOHEXOL 350 MG/ML SOLN COMPARISON:  None. FINDINGS: CT HEAD FINDINGS Brain: Cerebral volume within normal limits for patient age. No evidence for acute intracranial hemorrhage. No findings to suggest acute large vessel territory infarct. No mass lesion, midline shift, or mass effect. Ventricles are normal in size without evidence for hydrocephalus. No extra-axial fluid collection identified. Vascular: No hyperdense vessel identified. Skull: Scalp soft tissues demonstrate no acute abnormality. Calvarium intact. Sinuses/Orbits: Globes and orbital soft tissues  within normal limits. Visualized paranasal sinuses are clear. No mastoid effusion. CTA NECK FINDINGS Aortic arch: Visualized aortic arch normal caliber with normal branch pattern. No stenosis about the origin of the great vessels. Right carotid system: Right common and internal carotid arteries widely patent without stenosis, dissection or occlusion. Left carotid system: Left common and internal carotid arteries widely patent without stenosis, dissection or occlusion. Vertebral arteries: Both vertebral arteries arise from the subclavian arteries. No proximal subclavian artery stenosis. Both vertebral arteries widely patent without stenosis, dissection or occlusion. Skeleton: No discrete or worrisome osseous lesions. Other neck: No other acute soft tissue abnormality within the neck. Upper chest: Unremarkable. Review of the MIP images confirms the above findings CTA  HEAD FINDINGS Anterior circulation: Both internal carotid arteries widely patent to the termini without stenosis. A1 segments widely patent. Normal anterior communicating artery complex. Both anterior cerebral arteries widely patent to their distal aspects without stenosis. No M1 stenosis or occlusion. Normal MCA bifurcations. Distal MCA branches well perfused and symmetric. Posterior circulation: Both V4 segments patent to the vertebrobasilar junction without stenosis. Both PICA patent. Basilar widely patent to its distal aspect without stenosis. Superior cerebral arteries patent bilaterally. Left PCA supplied via the basilar. Right PCA supplied via a hypoplastic right P1 segment and robust right posterior communicating artery. Both PCAs well perfused to their distal aspects. Venous sinuses: Patent allowing for timing the contrast bolus. Anatomic variants: None significant.  No aneurysm. Review of the MIP images confirms the above findings IMPRESSION: 1. Normal CTA of the head and neck. No large vessel occlusion, hemodynamically significant stenosis, or other acute vascular abnormality. 2. No other acute intracranial abnormality. Electronically Signed   By: Rise Mu M.D.   On: 08/02/2021 21:59   DG Chest 2 View  Result Date: 08/02/2021 CLINICAL DATA:  Chest pain EXAM: CHEST - 2 VIEW COMPARISON:  None. FINDINGS: Heart size and mediastinal contours are within normal limits. No suspicious pulmonary opacities identified. No pleural effusion or pneumothorax visualized. No acute osseous abnormality appreciated. IMPRESSION: No acute intrathoracic process identified. Electronically Signed   By: Jannifer Hick M.D.   On: 08/02/2021 18:06   MR BRAIN WO CONTRAST  Result Date: 08/02/2021 CLINICAL DATA:  Initial evaluation for neuro deficit, stroke suspected. EXAM: MRI HEAD WITHOUT CONTRAST TECHNIQUE: Multiplanar, multiecho pulse sequences of the brain and surrounding structures were obtained without  intravenous contrast. COMPARISON:  Prior study from earlier the same day. FINDINGS: Brain: Examination moderately degraded by motion artifact. Cerebral volume within normal limits. No focal parenchymal signal abnormality. No evidence for acute or subacute infarct. No encephalomalacia to suggest chronic cortical infarct or other insult. No acute or chronic intracranial blood products. No mass lesion or midline shift. No hydrocephalus or extra-axial fluid collection. Pituitary gland grossly within normal limits. Vascular: Major intracranial vascular flow voids are maintained. Skull and upper cervical spine: Craniocervical junction within normal limits. Bone marrow signal intensity normal. No scalp soft tissue abnormality. Sinuses/Orbits: Globes and orbital soft tissues within normal limits. Paranasal sinuses and mastoid air cells are clear. Other: None. IMPRESSION: Normal brain MRI.  No acute intracranial abnormality. Electronically Signed   By: Rise Mu M.D.   On: 08/02/2021 23:44   CT Angio Chest Aorta W and/or Wo Contrast  Result Date: 08/02/2021 CLINICAL DATA:  Central chest pain. EXAM: CT ANGIOGRAPHY CHEST WITH CONTRAST TECHNIQUE: Multidetector CT imaging of the chest was performed using the standard protocol during bolus administration of intravenous contrast. Multiplanar CT image reconstructions and MIPs were  obtained to evaluate the vascular anatomy. RADIATION DOSE REDUCTION: This exam was performed according to the departmental dose-optimization program which includes automated exposure control, adjustment of the mA and/or kV according to patient size and/or use of iterative reconstruction technique. CONTRAST:  125mL OMNIPAQUE IOHEXOL 350 MG/ML SOLN COMPARISON:  None. FINDINGS: Cardiovascular: The thoracic aorta is normal in appearance, without evidence of aneurysmal dilatation or dissection. The pulmonary arteries are markedly limited in evaluation secondary to suboptimal opacification with  intravenous contrast. Normal heart size. No pericardial effusion. Mediastinum/Nodes: No enlarged mediastinal, hilar, or axillary lymph nodes. Thyroid gland, trachea, and esophagus demonstrate no significant findings. Lungs/Pleura: Lungs are clear. No pleural effusion or pneumothorax. Upper Abdomen: No acute abnormality. Musculoskeletal: No chest wall abnormality. No acute or significant osseous findings. Other: The study is limited secondary to beam hardening artifact seen along the posterior aspect of chest and abdomen. Review of the MIP images confirms the above findings. IMPRESSION: 1. Markedly limited evaluation of the pulmonary arteries, as described above. As result, acute pulmonary embolism cannot be excluded. Correlation with follow-up chest CTA is recommended if pulmonary embolism is of clinical concern. 2. No evidence of acute cardiopulmonary disease. Electronically Signed   By: Aram Candelahaddeus  Houston M.D.   On: 08/02/2021 21:41   ECHOCARDIOGRAM COMPLETE  Result Date: 08/04/2021    ECHOCARDIOGRAM REPORT   Patient Name:   Rutherford NailGARLAND Sahagian Date of Exam: 08/04/2021 Medical Rec #:  [id number]        Height:       77.0 in Accession #:    [id number]       Weight:       396.0 lb Date of Birth:  1991-01-27        BSA:          2.990 m Patient Age:    30 years         BP:           116/85 mmHg Patient Gender: M                HR:           102 bpm. Exam Location:  [facility] Procedure: 2D Echo, Cardiac Doppler and Color Doppler Indications:     Chest pain R07.9  History:         Patient has no prior history of Echocardiogram examinations.                  TIA, Arrythmias:Atrial Fibrillation; Risk Factors:Diabetes,                  Hypertension and Sleep Apnea.  Sonographer:     Cristela BlueJerry Hege Referring Phys:  [id number] CADENCE H FURTH Diagnosing Phys: Debbe OdeaBrian Agbor-Etang MD  Sonographer Comments: Technically challenging study due to limited acoustic windows, no apical window and no subcostal window. The only obtainable view was  parasternal. IMPRESSIONS  1. Left ventricular ejection fraction, by estimation, is 50%. The left ventricle has low normal function. Left ventricular endocardial border not optimally defined to evaluate regional wall motion. There is mild left ventricular hypertrophy. Left ventricular diastolic parameters are indeterminate.  2. Right ventricular systolic function was not well visualized. The right ventricular size is not well visualized.  3. The mitral valve is normal in structure. No evidence of mitral valve regurgitation.  4. The aortic valve is tricuspid. Aortic valve regurgitation is not visualized. FINDINGS  Left Ventricle: Left ventricular ejection fraction, by estimation, is 50%. The left ventricle has low normal function. Left ventricular  endocardial border not optimally defined to evaluate regional wall motion. The left ventricular internal cavity size was normal in size. There is mild left ventricular hypertrophy. Left ventricular diastolic parameters are indeterminate. Right Ventricle: The right ventricular size is not well visualized. No increase in right ventricular wall thickness. Right ventricular systolic function was not well visualized. Left Atrium: Left atrial size was normal in size. Right Atrium: Right atrial size was not well visualized. Pericardium: There is no evidence of pericardial effusion. Mitral Valve: The mitral valve is normal in structure. No evidence of mitral valve regurgitation. Tricuspid Valve: The tricuspid valve is normal in structure. Tricuspid valve regurgitation is trivial. Aortic Valve: The aortic valve is tricuspid. Aortic valve regurgitation is not visualized. Pulmonic Valve: The pulmonic valve was normal in structure. Pulmonic valve regurgitation is not visualized. Aorta: The aortic root is normal in size and structure. IAS/Shunts: The interatrial septum was not well visualized.  LEFT VENTRICLE PLAX 2D LVIDd:         4.50 cm LVIDs:         3.00 cm LV PW:         1.20 cm LV  IVS:        1.10 cm LVOT diam:     2.20 cm LVOT Area:     3.80 cm  LEFT ATRIUM         Index LA diam:    4.20 cm 1.40 cm/m                        PULMONIC VALVE AORTA                 PV Vmax:        0.71 m/s Ao Root diam: 3.10 cm PV Vmean:       48.750 cm/s                       PV VTI:         0.104 m                       PV Peak grad:   2.0 mmHg                       PV Mean grad:   1.0 mmHg                       RVOT Peak grad: 2 mmHg   SHUNTS Systemic Diam: 2.20 cm Pulmonic VTI:  0.101 m Debbe Odea MD Electronically signed by Debbe Odea MD Signature Date/Time: 08/04/2021/1:35:02 PM    Final     Microbiology: Results for orders placed or performed during the hospital encounter of 08/02/21  Resp Panel by RT-PCR (Flu A&B, Covid) Nasopharyngeal Swab     Status: None   Collection Time: 08/02/21  8:27 PM   Specimen: Nasopharyngeal Swab; Nasopharyngeal(NP) swabs in vial transport medium  Result Value Ref Range Status   SARS Coronavirus 2 by RT PCR NEGATIVE NEGATIVE Final    Comment: (NOTE) SARS-CoV-2 target nucleic acids are NOT DETECTED.  The SARS-CoV-2 RNA is generally detectable in upper respiratory specimens during the acute phase of infection. The lowest concentration of SARS-CoV-2 viral copies this assay can detect is 138 copies/mL. A negative result does not preclude SARS-Cov-2 infection and should not be used as the sole basis for treatment or other patient management decisions. A negative  result may occur with  improper specimen collection/handling, submission of specimen other than nasopharyngeal swab, presence of viral mutation(s) within the areas targeted by this assay, and inadequate number of viral copies(<138 copies/mL). A negative result must be combined with clinical observations, patient history, and epidemiological information. The expected result is Negative.  Fact Sheet for Patients:  BloggerCourse.com  Fact Sheet for Healthcare  Providers:  SeriousBroker.it  This test is no t yet approved or cleared by the [place] FDA and  has been authorized for detection and/or diagnosis of SARS-CoV-2 by FDA under an Emergency Use Authorization (EUA). This EUA will remain  in effect (meaning this test can be used) for the duration of the COVID-19 declaration under Section 564(b)(1) of the Act, 21 U.S.C.section 360bbb-3(b)(1), unless the authorization is terminated  or revoked sooner.       Influenza A by PCR NEGATIVE NEGATIVE Final   Influenza B by PCR NEGATIVE NEGATIVE Final    Comment: (NOTE) The Xpert Xpress SARS-CoV-2/FLU/RSV plus assay is intended as an aid in the diagnosis of influenza from Nasopharyngeal swab specimens and should not be used as a sole basis for treatment. Nasal washings and aspirates are unacceptable for Xpert Xpress SARS-CoV-2/FLU/RSV testing.  Fact Sheet for Patients: BloggerCourse.com  Fact Sheet for Healthcare Providers: SeriousBroker.it  This test is not yet approved or cleared by the [place] FDA and has been authorized for detection and/or diagnosis of SARS-CoV-2 by FDA under an Emergency Use Authorization (EUA). This EUA will remain in effect (meaning this test can be used) for the duration of the COVID-19 declaration under Section 564(b)(1) of the Act, 21 U.S.C. section 360bbb-3(b)(1), unless the authorization is terminated or revoked.  Performed at [facility], [address] [place], [place] [address]     Labs: CBC: Recent Labs  Lab 08/02/21 1733  WBC 7.4  HGB 13.8  HCT 43.0  MCV 86.5  PLT 220   Basic Metabolic Panel: Recent Labs  Lab 08/02/21 1733 08/03/21 1307  NA 138  --   K 4.2  --   CL 98  --   CO2 32  --   GLUCOSE 94  --   BUN 15  --   CREATININE 0.80  --   CALCIUM 8.8*  --   MG  --  2.0   Liver Function Tests: No results for input(s): AST, ALT,  ALKPHOS, BILITOT, PROT, ALBUMIN in the last 168 hours. CBG: Recent Labs  Lab 08/03/21 0812  GLUCAP 132*    Discharge time spent: greater than 30 minutes.  Signed: Enedina Finner, MD [facility] 08/04/2021

## 2021-08-12 ENCOUNTER — Emergency Department
Admission: EM | Admit: 2021-08-12 | Discharge: 2021-08-12 | Disposition: A | Discharge status: Home / Self Care | Payer: Medicaid Other | Attending: Student in an Organized Health Care Education/Training Program | Admitting: Student in an Organized Health Care Education/Training Program

## 2021-08-12 ENCOUNTER — Other Ambulatory Visit: Payer: Self-pay

## 2021-08-12 ENCOUNTER — Emergency Department: Payer: Medicaid Other

## 2021-08-12 DIAGNOSIS — E790 Hyperuricemia without signs of inflammatory arthritis and tophaceous disease: Secondary | ICD-10-CM | POA: Insufficient documentation

## 2021-08-12 DIAGNOSIS — Z7901 Long term (current) use of anticoagulants: Secondary | ICD-10-CM | POA: Insufficient documentation

## 2021-08-12 DIAGNOSIS — I4891 Unspecified atrial fibrillation: Secondary | ICD-10-CM | POA: Diagnosis not present

## 2021-08-12 DIAGNOSIS — R0789 Other chest pain: Secondary | ICD-10-CM | POA: Diagnosis present

## 2021-08-12 DIAGNOSIS — M10072 Idiopathic gout, left ankle and foot: Secondary | ICD-10-CM | POA: Diagnosis not present

## 2021-08-12 LAB — CBC
HCT: 42.7 % (ref 39.0–52.0)
Hemoglobin: 13.6 g/dL (ref 13.0–17.0)
MCH: 27.6 pg (ref 26.0–34.0)
MCHC: 31.9 g/dL (ref 30.0–36.0)
MCV: 86.8 fL (ref 80.0–100.0)
Platelets: 233 10*3/uL (ref 150–400)
RBC: 4.92 MIL/uL (ref 4.22–5.81)
RDW: 13.8 % (ref 11.5–15.5)
WBC: 9.2 10*3/uL (ref 4.0–10.5)
nRBC: 0 % (ref 0.0–0.2)

## 2021-08-12 LAB — BASIC METABOLIC PANEL
Anion gap: 10 (ref 5–15)
BUN: 15 mg/dL (ref 6–20)
CO2: 32 mmol/L (ref 22–32)
Calcium: 8.9 mg/dL (ref 8.9–10.3)
Chloride: 95 mmol/L — ABNORMAL LOW (ref 98–111)
Creatinine, Ser: 0.94 mg/dL (ref 0.61–1.24)
GFR, Estimated: >60 mL/min (ref >60)
Glucose, Bld: 110 mg/dL — ABNORMAL HIGH (ref 70–99)
Potassium: 3.7 mmol/L (ref 3.5–5.1)
Sodium: 137 mmol/L (ref 135–145)

## 2021-08-12 LAB — TROPONIN I (HIGH SENSITIVITY)
Troponin I (High Sensitivity): 3 ng/L (ref <18)
Troponin I (High Sensitivity): 3 ng/L (ref <18)

## 2021-08-12 LAB — URIC ACID: Uric Acid, Serum: 10 mg/dL — ABNORMAL HIGH (ref 3.7–8.6)

## 2021-08-12 MED ORDER — SODIUM CHLORIDE 0.9% FLUSH
3.0000 mL | Freq: Once | INTRAVENOUS | Status: AC
  Administered 2021-08-12: 3 mL via INTRAVENOUS

## 2021-08-12 MED ORDER — DILTIAZEM HCL 60 MG PO TABS
30.0000 mg | ORAL_TABLET | Freq: Once | ORAL | Status: AC
  Administered 2021-08-12: 30 mg via ORAL
  Filled 2021-08-12: qty 1

## 2021-08-12 MED ORDER — SODIUM CHLORIDE 0.9 % IV BOLUS
500.0000 mL | Freq: Once | INTRAVENOUS | Status: AC
  Administered 2021-08-12: 500 mL via INTRAVENOUS

## 2021-08-12 MED ORDER — HYDROCODONE-ACETAMINOPHEN 5-325 MG PO TABS: 1.0000 | ORAL_TABLET | ORAL | 0 refills | Status: DC | PRN

## 2021-08-12 MED ORDER — METOPROLOL TARTRATE 5 MG/5ML IV SOLN
5.0000 mg | Freq: Once | INTRAVENOUS | Status: AC
Start: 2021-08-12 — End: 2021-08-12
  Administered 2021-08-12: 5 mg via INTRAVENOUS
  Filled 2021-08-12: qty 5

## 2021-08-12 MED ORDER — COLCHICINE 0.6 MG PO TABS
0.6000 mg | ORAL_TABLET | Freq: Once | ORAL | Status: AC
  Administered 2021-08-12: 0.6 mg via ORAL
  Filled 2021-08-12: qty 1

## 2021-08-12 MED ORDER — TRAMADOL HCL 50 MG PO TABS
50.0000 mg | ORAL_TABLET | Freq: Four times a day (QID) | ORAL | 0 refills | Status: DC | PRN
Start: 2021-08-12 — End: 2021-08-16

## 2021-08-12 MED ORDER — OXYCODONE-ACETAMINOPHEN 5-325 MG PO TABS
1.0000 | ORAL_TABLET | Freq: Once | ORAL | Status: AC
  Administered 2021-08-12: 1 via ORAL
  Filled 2021-08-12: qty 1

## 2021-08-12 NOTE — ED Triage Notes (Addendum)
Pt comes into the ED via EMS from urgent care with c/o chest pain with afib RVR noted with EMS 100-160. Left great toe pain with redness and swelling ? ?95%RA ?110/56 ?CBG178 ?98.2 temp ?

## 2021-08-12 NOTE — ED Provider Notes (Signed)
? ?[facility] ?Provider Note ? ? ? None  ?  (approximate) ? ? ?History  ? ?Chest Pain and Toe Pain ? ? ?HPI ? ?Daniel [name] is a 31 y.o. male with a history of A-fib on Eliquis as well as history of gout presents to the ER for evaluation of chest discomfort palpitations as well as left great toe pain consistent with gout.  Past several days.  Was seen at urgent care and found to be mildly tachycardic in the low 100s and was sent to the ER for further evaluation. ?  ? ? ?Physical Exam  ? ?Triage Vital Signs: ?ED Triage Vitals [08/12/21 1259]  ?Enc Vitals Group  ?   BP 137/69  ?   Pulse Rate (!) 108  ?   Resp 18  ?   Temp 98.3 ?F (36.8 ?C)  ?   Temp Source Oral  ?   SpO2 99 %  ?   Weight   ?   Height   ?   Head Circumference   ?   Peak Flow   ?   Pain Score   ?   Pain Loc   ?   Pain Edu?   ?   Excl. in GC?   ? ? ?Most recent vital signs: ?Vitals:  ? 08/12/21 1500 08/12/21 1518  ?BP: 124/67 124/67  ?Pulse: 82   ?Resp: (!) 21   ?Temp:    ?SpO2: 97%   ? ? ? ?Constitutional: Alert  ?Eyes: Conjunctivae are normal.  ?Head: Atraumatic. ?Nose: No congestion/rhinnorhea. ?Mouth/Throat: Mucous membranes are moist.   ?Neck: Painless ROM.  ?Cardiovascular:   Good peripheral circulation. Irregularly irregular hr ?Respiratory: Normal respiratory effort.  No retractions.  ?Gastrointestinal: Soft and nontender.  ?Musculoskeletal: Warmth and tenderness to palpation at the base of the left great with no overlying crepitus.  Pain with passive and active range joint.  Mild edema strong PT and DP pulse. ?Neurologic:  MAE spontaneously. No gross focal neurologic deficits are appreciated.  ?Skin:  Skin is warm, dry and intact. No rash noted. ?Psychiatric: Mood and affect are normal. Speech and behavior are normal. ? ? ? ?ED Results / Procedures / Treatments  ? ?Labs ?(all labs ordered are listed, but only abnormal results are displayed) ?Labs Reviewed  ?BASIC METABOLIC PANEL - Abnormal; Notable for the following  components:  ?    Result Value  ? Chloride 95 (*)   ? Glucose, Bld 110 (*)   ? All other components within normal limits  ?URIC ACID - Abnormal; Notable for the following components:  ? Uric Acid, Serum 10.0 (*)   ? All other components within normal limits  ?CBC  ?TROPONIN I (HIGH SENSITIVITY)  ?TROPONIN I (HIGH SENSITIVITY)  ? ? ? ?EKG ? ?ED ECG REPORT ?I, Willy Eddy, the attending physician, personally viewed and interpreted this ECG. ? ? Date: 08/12/2021 ? EKG Time: 13:07 ? Rate: 110 ? Rhythm: afib with rvr ? Axis: normal ? Intervals: normal qt ? ST&T Change: nonspecific st and t wave abn ? ? ? ?RADIOLOGY ?Please see ED Course for my review and interpretation. ? ?I personally reviewed all radiographic images ordered to evaluate for the above acute complaints and reviewed radiology reports and findings.  These findings were personally discussed with the patient.  Please see medical record for radiology report. ? ? ? ?PROCEDURES: ? ?Critical Care performed:  ? ?Procedures ? ? ?MEDICATIONS ORDERED IN ED: ?Medications  ?colchicine tablet 0.6 mg (has no  administration in time range)  ?sodium chloride flush (NS) 0.9 % injection 3 mL (3 mLs Intravenous Given 08/12/21 1520)  ?oxyCODONE-acetaminophen (PERCOCET/ROXICET) 5-325 MG per tablet 1 tablet (1 tablet Oral Given 08/12/21 1519)  ?sodium chloride 0.9 % bolus 500 mL (500 mLs Intravenous New Bag/Given 08/12/21 1523)  ?metoprolol tartrate (LOPRESSOR) injection 5 mg (5 mg Intravenous Given 08/12/21 1518)  ?diltiazem (CARDIZEM) tablet 30 mg (30 mg Oral Given 08/12/21 1518)  ? ? ? ?IMPRESSION / MDM / ASSESSMENT AND PLAN / ED COURSE  ?I reviewed the triage vital signs and the nursing notes. ?             ?               ? ?Differential diagnosis includes, but is not limited to, A-fib with RVR, dehydration, electrolyte abnormality, anemia, gout, sepsis ? ?Patient nontoxic-appearing A-fib with RVR but well perfused.  Suspect that pain is likely driving much of this will give  some Lopressor as well as additional dose of his home Cardizem.  As he is reportedly having some chest pain will order serial enzymes.  Doubt PE as he is on Eliquis. ? ? ?Clinical Course as of 08/12/21 1557  ?Thu Aug 12, 2021  ?1356 X-ray of the left foot on my review does not show any evidence of fracture dislocation.  My interpretation of chest x-ray I do not appreciate evidence of consolidation or pneumothorax. [PR]  ?1550 Patient reassessed.  Pain improved.  Troponin negative.  Heart rate rate controlled 90.  Will give colchicine.  Uric acid is elevated.  His presentation is consistent with gouty arthritis given his history of gout in the same joint.  Not consistent with septic arthritis.  Patient be signed out to oncoming physician and if repeat troponin negative will be appropriate for outpatient follow-up. [PR]  ?  ?Clinical Course User Index ?[PR] Willy Eddy, MD  ? ? ? ?FINAL CLINICAL IMPRESSION(S) / ED DIAGNOSES  ? ?Final diagnoses:  ?Atrial fibrillation with rapid ventricular response (HCC)  ?Acute gout involving toe of left foot, unspecified cause  ? ? ? ?Rx / DC Orders  ? ?ED Discharge Orders   ? ?      Ordered  ?  HYDROcodone-acetaminophen (NORCO) 5-325 MG tablet  Every 4 hours PRN,   Status:  Discontinued       ? 08/12/21 1554  ?  traMADol (ULTRAM) 50 MG tablet  Every 6 hours PRN       ? 08/12/21 1555  ? ?  ?  ? ?  ? ? ? ?Note:  This document was prepared using Dragon voice recognition software and may include unintentional dictation errors. ? ?  ?Willy Eddy, MD ?08/12/21 1557 ? ?

## 2021-08-12 NOTE — ED Notes (Signed)
Dixon Boos- Caregiver contacted about pt discharge, caregiver understood and will provide transportation for pt.  ?

## 2021-08-13 ENCOUNTER — Other Ambulatory Visit: Payer: Self-pay

## 2021-08-13 ENCOUNTER — Inpatient Hospital Stay
Admission: EM | Admit: 2021-08-13 | Discharge: 2021-08-16 | DRG: 309 | Disposition: A | Discharge status: Home / Self Care | Payer: Medicaid Other | Attending: Hospitalist | Admitting: Hospitalist

## 2021-08-13 ENCOUNTER — Emergency Department: Payer: Medicaid Other

## 2021-08-13 DIAGNOSIS — I10 Essential (primary) hypertension: Secondary | ICD-10-CM | POA: Diagnosis present

## 2021-08-13 DIAGNOSIS — I4892 Unspecified atrial flutter: Secondary | ICD-10-CM | POA: Diagnosis present

## 2021-08-13 DIAGNOSIS — Z7901 Long term (current) use of anticoagulants: Secondary | ICD-10-CM

## 2021-08-13 DIAGNOSIS — R0789 Other chest pain: Secondary | ICD-10-CM

## 2021-08-13 DIAGNOSIS — Z8673 Personal history of transient ischemic attack (TIA), and cerebral infarction without residual deficits: Secondary | ICD-10-CM

## 2021-08-13 DIAGNOSIS — M109 Gout, unspecified: Secondary | ICD-10-CM | POA: Diagnosis present

## 2021-08-13 DIAGNOSIS — Z8616 Personal history of COVID-19: Secondary | ICD-10-CM

## 2021-08-13 DIAGNOSIS — J9611 Chronic respiratory failure with hypoxia: Secondary | ICD-10-CM

## 2021-08-13 DIAGNOSIS — Z87891 Personal history of nicotine dependence: Secondary | ICD-10-CM

## 2021-08-13 DIAGNOSIS — E785 Hyperlipidemia, unspecified: Secondary | ICD-10-CM | POA: Diagnosis present

## 2021-08-13 DIAGNOSIS — Z6841 Body Mass Index (BMI) 40.0 and over, adult: Secondary | ICD-10-CM

## 2021-08-13 DIAGNOSIS — I4891 Unspecified atrial fibrillation: Principal | ICD-10-CM | POA: Diagnosis present

## 2021-08-13 DIAGNOSIS — Z20822 Contact with and (suspected) exposure to covid-19: Secondary | ICD-10-CM | POA: Diagnosis present

## 2021-08-13 DIAGNOSIS — Z885 Allergy status to narcotic agent status: Secondary | ICD-10-CM

## 2021-08-13 DIAGNOSIS — F29 Unspecified psychosis not due to a substance or known physiological condition: Secondary | ICD-10-CM

## 2021-08-13 DIAGNOSIS — R29818 Other symptoms and signs involving the nervous system: Secondary | ICD-10-CM

## 2021-08-13 DIAGNOSIS — F250 Schizoaffective disorder, bipolar type: Secondary | ICD-10-CM | POA: Diagnosis present

## 2021-08-13 DIAGNOSIS — E119 Type 2 diabetes mellitus without complications: Secondary | ICD-10-CM

## 2021-08-13 DIAGNOSIS — G4733 Obstructive sleep apnea (adult) (pediatric): Secondary | ICD-10-CM

## 2021-08-13 DIAGNOSIS — Z79891 Long term (current) use of opiate analgesic: Secondary | ICD-10-CM

## 2021-08-13 DIAGNOSIS — M1A9XX0 Chronic gout, unspecified, without tophus (tophi): Secondary | ICD-10-CM | POA: Diagnosis present

## 2021-08-13 DIAGNOSIS — Z79899 Other long term (current) drug therapy: Secondary | ICD-10-CM

## 2021-08-13 DIAGNOSIS — Z9981 Dependence on supplemental oxygen: Secondary | ICD-10-CM

## 2021-08-13 DIAGNOSIS — Z7982 Long term (current) use of aspirin: Secondary | ICD-10-CM

## 2021-08-13 DIAGNOSIS — Z888 Allergy status to other drugs, medicaments and biological substances status: Secondary | ICD-10-CM

## 2021-08-13 LAB — CBC WITH DIFFERENTIAL/PLATELET
Abs Immature Granulocytes: 0.13 10*3/uL — ABNORMAL HIGH (ref 0.00–0.07)
Basophils Absolute: 0 10*3/uL (ref 0.0–0.1)
Basophils Relative: 1 %
Eosinophils Absolute: 0.1 10*3/uL (ref 0.0–0.5)
Eosinophils Relative: 1 %
HCT: 43.7 % (ref 39.0–52.0)
Hemoglobin: 14.3 g/dL (ref 13.0–17.0)
Immature Granulocytes: 2 %
Lymphocytes Relative: 22 %
Lymphs Abs: 1.5 10*3/uL (ref 0.7–4.0)
MCH: 28.3 pg (ref 26.0–34.0)
MCHC: 32.7 g/dL (ref 30.0–36.0)
MCV: 86.5 fL (ref 80.0–100.0)
Monocytes Absolute: 1.1 10*3/uL — ABNORMAL HIGH (ref 0.1–1.0)
Monocytes Relative: 15 %
Neutro Abs: 4.1 10*3/uL (ref 1.7–7.7)
Neutrophils Relative %: 59 %
Platelets: 259 10*3/uL (ref 150–400)
RBC: 5.05 MIL/uL (ref 4.22–5.81)
RDW: 13.5 % (ref 11.5–15.5)
WBC: 6.9 10*3/uL (ref 4.0–10.5)
nRBC: 0 % (ref 0.0–0.2)

## 2021-08-13 MED ORDER — DILTIAZEM HCL 25 MG/5ML IV SOLN
20.0000 mg | Freq: Once | INTRAVENOUS | Status: AC
  Administered 2021-08-13: 20 mg via INTRAVENOUS
  Filled 2021-08-13: qty 5

## 2021-08-13 MED ORDER — DILTIAZEM HCL-DEXTROSE 125-5 MG/125ML-% IV SOLN (PREMIX)
5.0000 mg/h | INTRAVENOUS | Status: DC
  Administered 2021-08-13: 5 mg/h via INTRAVENOUS
  Filled 2021-08-13: qty 125

## 2021-08-13 MED ORDER — MORPHINE SULFATE (PF) 4 MG/ML IV SOLN
6.0000 mg | Freq: Once | INTRAVENOUS | Status: AC
  Administered 2021-08-13: 6 mg via INTRAVENOUS
  Filled 2021-08-13: qty 2

## 2021-08-13 NOTE — ED Triage Notes (Signed)
Pt presents to ER via [facility] with c/o chest pain and pain from his gout.  Pt seen here yesterday for same.  Pt states chest pain started a couple days ago and is mid-left area of chest and goes down his left arm.  Pt states he takes eliquis at home for his afib.  Pt is A&O x4 at this time in NAD.  Pt has orthopedic shoe noted to left foot.  NAD noted at this time.   ?

## 2021-08-13 NOTE — ED Provider Notes (Signed)
Report ? ?[facility] ?Provider Note ? ? ? Event Date/Time  ? First MD Initiated Contact with Patient 08/13/21 2304   ?  (approximate) ? ? ?History  ? ?Chest Pain and Gout ? ? ?HPI ? ?Daniel [name] is a 31 y.o. male who presents to the ED for evaluation of Chest Pain and Gout ?  ?I reviewed DC summary from 3/8.  Patient was admitted medically for A-fib with RVR.  Has a known history of A-fib on Eliquis, morbid obesity.  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 ?Also seen yesterday in the ED for complaints of chest and left great toe pain.  Given single dose of IV metoprolol, started on colchicine and discharged ? ?Patient returns to the ED for evaluation of continued chest and left great toe pain.  Reports chest pain intermittently for couple weeks, continuous since yesterday.  Similarly, reports continued and constant left great toe pain.  Reports dyspnea associated with his chest pain that is chronic.  Denies any syncopal episodes, falls or injuries.  Denies exertional chest pain, reported he does not walk very much due to his gout ? ?Physical Exam  ? ?Triage Vital Signs: ?ED Triage Vitals  ?Enc Vitals Group  ?   BP   ?   Pulse   ?   Resp   ?   Temp   ?   Temp src   ?   SpO2   ?   Weight   ?   Height   ?   Head Circumference   ?   Peak Flow   ?   Pain Score   ?   Pain Loc   ?   Pain Edu?   ?   Excl. in GC?   ? ? ?Most recent vital signs: ?Vitals:  ? 08/13/21 2346 08/14/21 0122  ?BP: 109/71 123/65  ?Pulse: (!) 118 (!) 135  ?Resp: 18 18  ?Temp:    ?SpO2: 94% 98%  ? ? ?General: Awake, no distress.  Morbidly obese.  Pleasant and conversational.  Flat affect. ?CV:  Good peripheral perfusion.  Tachycardic and regular ?Resp:  Normal effort.  CTA B ?Abd:  No distention.  Soft and benign ?MSK:  Mild swelling and slight erythema to the first MCP on the left.  No signs of trauma or open injury. ?Neuro:  No focal deficits appreciated. ?Other:   ? ? ?ED Results / Procedures / Treatments  ? ?Labs ?(all  labs ordered are listed, but only abnormal results are displayed) ?Labs Reviewed  ?CBC WITH DIFFERENTIAL/PLATELET - Abnormal; Notable for the following components:  ?    Result Value  ? Monocytes Absolute 1.1 (*)   ? Abs Immature Granulocytes 0.13 (*)   ? All other components within normal limits  ?COMPREHENSIVE METABOLIC PANEL - Abnormal; Notable for the following components:  ? Chloride 93 (*)   ? Glucose, Bld 128 (*)   ? All other components within normal limits  ?BRAIN NATRIURETIC PEPTIDE - Abnormal; Notable for the following components:  ? B Natriuretic Peptide 235.4 (*)   ? All other components within normal limits  ?RESP PANEL BY RT-PCR (FLU A&B, COVID) ARPGX2  ?MAGNESIUM  ?TROPONIN I (HIGH SENSITIVITY)  ?TROPONIN I (HIGH SENSITIVITY)  ? ? ?EKG ?Narrow complex tachycardia that is regular, likely atrial flutter 2 1 block, rate of 144 bpm.  Normal axis and intervals.  Nonspecific ST changes inferiorly and laterally.  No STEMI. ? ?RADIOLOGY ?CXR reviewed by me without evidence of acute  cardiopulmonary pathology. ? ?Official radiology report(s): ?DG Chest Portable 1 View ? ?Result Date: 08/13/2021 ?CLINICAL DATA:  Shortness of breath EXAM: PORTABLE CHEST 1 VIEW COMPARISON:  08/12/2021 FINDINGS: The heart size and mediastinal contours are within normal limits. Both lungs are clear. The visualized skeletal structures are unremarkable. IMPRESSION: No active disease. Electronically Signed   By: Deatra Robinson M.D.   On: 08/13/2021 23:43   ? ?PROCEDURES and INTERVENTIONS: ? ?.1-3 Lead EKG Interpretation ?Performed by: Delton Prairie, MD ?Authorized by: Delton Prairie, MD  ? ?  Interpretation: abnormal   ?  ECG rate:  144 ?  ECG rate assessment: tachycardic   ?  Rhythm: atrial flutter   ?  Ectopy: none   ?  Conduction: normal   ?.Critical Care ?Performed by: Delton Prairie, MD ?Authorized by: Delton Prairie, MD  ? ?Critical care provider statement:  ?  Critical care time (minutes):  30 ?  Critical care time was exclusive of:   Separately billable procedures and treating other patients ?  Critical care was necessary to treat or prevent imminent or life-threatening deterioration of the following conditions:  Cardiac failure and circulatory failure ?  Critical care was time spent personally by me on the following activities:  Development of treatment plan with patient or surrogate, discussions with consultants, evaluation of patient's response to treatment, examination of patient, ordering and review of laboratory studies, ordering and review of radiographic studies, ordering and performing treatments and interventions, pulse oximetry, re-evaluation of patient's condition and review of old charts ? ?Medications  ?diltiazem (CARDIZEM) 125 mg in dextrose 5% 125 mL (1 mg/mL) infusion (7.5 mg/hr Intravenous Rate/Dose Change 08/14/21 0121)  ?apixaban (ELIQUIS) tablet 5 mg (has no administration in time range)  ?acetaminophen (TYLENOL) tablet 650 mg (has no administration in time range)  ?ondansetron (ZOFRAN) injection 4 mg (has no administration in time range)  ?morphine (PF) 4 MG/ML injection 6 mg (6 mg Intravenous Given 08/13/21 2337)  ?diltiazem (CARDIZEM) injection 20 mg (20 mg Intravenous Given 08/13/21 2333)  ?furosemide (LASIX) injection 20 mg (20 mg Intravenous Given 08/14/21 0038)  ? ? ? ?IMPRESSION / MDM / ASSESSMENT AND PLAN / ED COURSE  ?I reviewed the triage vital signs and the nursing notes. ? ?31 year old male with known atrial fibrillation and gout presents to the ED with acute on chronic chest and toe pain with evidence of atrial flutter with RVR requiring diltiazem drip and medical admission.  He looks systemically well, is complaining of acute on chronic left toe pain, which does appear to have a gout flare to his left MCP.  No distress or significant respiratory symptoms.  He remains hemodynamically stable, but tachycardic and what appears to be atrial flutter.  No ischemic features on his EKG. blood work with normal electrolytes  and CBC.  Negative troponins and no evidence of ACS.  His BNP is elevated but his CXR is clear.  Provided small dose of Lasix as volume overload and cardiac stretch may be contributing to his continued dysrhythmias and tachycardia.  We will consult with medicine for admission. ? ?Clinical Course as of 08/14/21 0127  ?Fri Aug 13, 2021  ?2345 Reassessed.  Heart rate improving to the 1 teens after diltiazem bolus, drip is starting now.  Repeat EKG confirms atrial flutter with variable block.  ?His caregiver is now at the bedside.  We discussed my recommendation for medical admission and they are agreeable. [DS]  ?  ?Clinical Course User Index ?[DS] Delton Prairie, MD  ? ? ? ?  FINAL CLINICAL IMPRESSION(S) / ED DIAGNOSES  ? ?Final diagnoses:  ?Atrial flutter with rapid ventricular response (HCC)  ?Other chest pain  ?Acute gout involving toe of left foot, unspecified cause  ? ? ? ?Rx / DC Orders  ? ?ED Discharge Orders   ? ? None  ? ?  ? ? ? ?Note:  This document was prepared using Dragon voice recognition software and may include unintentional dictation errors. ?  ?Delton PrairieSmith, Tyshea Imel, MD ?08/14/21 0128 ? ?

## 2021-08-13 NOTE — ED Notes (Signed)
While this nurse and provider at bedside, pt rhythm noted to change to Atrial Flutter. Repeat EKG obtained at bedside, pt denies any new c/o or sx at this time.  ?

## 2021-08-14 DIAGNOSIS — M109 Gout, unspecified: Secondary | ICD-10-CM | POA: Diagnosis present

## 2021-08-14 DIAGNOSIS — Z79899 Other long term (current) drug therapy: Secondary | ICD-10-CM | POA: Diagnosis not present

## 2021-08-14 DIAGNOSIS — F250 Schizoaffective disorder, bipolar type: Secondary | ICD-10-CM | POA: Diagnosis present

## 2021-08-14 DIAGNOSIS — I10 Essential (primary) hypertension: Secondary | ICD-10-CM | POA: Diagnosis present

## 2021-08-14 DIAGNOSIS — E119 Type 2 diabetes mellitus without complications: Secondary | ICD-10-CM

## 2021-08-14 DIAGNOSIS — Z9981 Dependence on supplemental oxygen: Secondary | ICD-10-CM | POA: Diagnosis not present

## 2021-08-14 DIAGNOSIS — Z79891 Long term (current) use of opiate analgesic: Secondary | ICD-10-CM | POA: Diagnosis not present

## 2021-08-14 DIAGNOSIS — I4891 Unspecified atrial fibrillation: Secondary | ICD-10-CM | POA: Diagnosis present

## 2021-08-14 DIAGNOSIS — R29818 Other symptoms and signs involving the nervous system: Secondary | ICD-10-CM

## 2021-08-14 DIAGNOSIS — J9611 Chronic respiratory failure with hypoxia: Secondary | ICD-10-CM

## 2021-08-14 DIAGNOSIS — Z7901 Long term (current) use of anticoagulants: Secondary | ICD-10-CM | POA: Diagnosis not present

## 2021-08-14 DIAGNOSIS — I4892 Unspecified atrial flutter: Secondary | ICD-10-CM | POA: Diagnosis present

## 2021-08-14 DIAGNOSIS — M1A9XX0 Chronic gout, unspecified, without tophus (tophi): Secondary | ICD-10-CM | POA: Diagnosis present

## 2021-08-14 DIAGNOSIS — G4733 Obstructive sleep apnea (adult) (pediatric): Secondary | ICD-10-CM

## 2021-08-14 DIAGNOSIS — Z888 Allergy status to other drugs, medicaments and biological substances status: Secondary | ICD-10-CM | POA: Diagnosis not present

## 2021-08-14 DIAGNOSIS — Z885 Allergy status to narcotic agent status: Secondary | ICD-10-CM | POA: Diagnosis not present

## 2021-08-14 DIAGNOSIS — Z8616 Personal history of COVID-19: Secondary | ICD-10-CM | POA: Diagnosis not present

## 2021-08-14 DIAGNOSIS — F29 Unspecified psychosis not due to a substance or known physiological condition: Secondary | ICD-10-CM

## 2021-08-14 DIAGNOSIS — R0789 Other chest pain: Secondary | ICD-10-CM | POA: Diagnosis present

## 2021-08-14 DIAGNOSIS — Z87891 Personal history of nicotine dependence: Secondary | ICD-10-CM | POA: Diagnosis not present

## 2021-08-14 DIAGNOSIS — Z8673 Personal history of transient ischemic attack (TIA), and cerebral infarction without residual deficits: Secondary | ICD-10-CM | POA: Diagnosis not present

## 2021-08-14 DIAGNOSIS — Z7982 Long term (current) use of aspirin: Secondary | ICD-10-CM | POA: Diagnosis not present

## 2021-08-14 DIAGNOSIS — Z20822 Contact with and (suspected) exposure to covid-19: Secondary | ICD-10-CM | POA: Diagnosis present

## 2021-08-14 DIAGNOSIS — Z6841 Body Mass Index (BMI) 40.0 and over, adult: Secondary | ICD-10-CM | POA: Diagnosis not present

## 2021-08-14 DIAGNOSIS — E785 Hyperlipidemia, unspecified: Secondary | ICD-10-CM | POA: Diagnosis present

## 2021-08-14 LAB — VALPROIC ACID LEVEL: Valproic Acid Lvl: 73 ug/mL (ref 50.0–100.0)

## 2021-08-14 LAB — COMPREHENSIVE METABOLIC PANEL
ALT: 13 U/L (ref 0–44)
AST: 19 U/L (ref 15–41)
Albumin: 3.6 g/dL (ref 3.5–5.0)
Alkaline Phosphatase: 63 U/L (ref 38–126)
Anion gap: 14 (ref 5–15)
BUN: 13 mg/dL (ref 6–20)
CO2: 31 mmol/L (ref 22–32)
Calcium: 9.2 mg/dL (ref 8.9–10.3)
Chloride: 93 mmol/L — ABNORMAL LOW (ref 98–111)
Creatinine, Ser: 1.02 mg/dL (ref 0.61–1.24)
GFR, Estimated: >60 mL/min (ref >60)
Glucose, Bld: 128 mg/dL — ABNORMAL HIGH (ref 70–99)
Potassium: 3.6 mmol/L (ref 3.5–5.1)
Sodium: 138 mmol/L (ref 135–145)
Total Bilirubin: 0.6 mg/dL (ref 0.3–1.2)
Total Protein: 8.1 g/dL (ref 6.5–8.1)

## 2021-08-14 LAB — GLUCOSE, CAPILLARY: Glucose-Capillary: 196 mg/dL — ABNORMAL HIGH (ref 70–99)

## 2021-08-14 LAB — TROPONIN I (HIGH SENSITIVITY)
Troponin I (High Sensitivity): 5 ng/L (ref <18)
Troponin I (High Sensitivity): 7 ng/L (ref <18)

## 2021-08-14 LAB — BRAIN NATRIURETIC PEPTIDE: B Natriuretic Peptide: 235.4 pg/mL — ABNORMAL HIGH (ref 0.0–100.0)

## 2021-08-14 LAB — RESP PANEL BY RT-PCR (FLU A&B, COVID) ARPGX2
Influenza A by PCR: NEGATIVE
Influenza B by PCR: NEGATIVE
SARS Coronavirus 2 by RT PCR: NEGATIVE

## 2021-08-14 LAB — MAGNESIUM: Magnesium: 2 mg/dL (ref 1.7–2.4)

## 2021-08-14 MED ORDER — DIVALPROEX SODIUM 500 MG PO DR TAB
1000.0000 mg | DELAYED_RELEASE_TABLET | Freq: Every day | ORAL | Status: DC
  Administered 2021-08-14 – 2021-08-16 (×3): 1000 mg via ORAL
  Filled 2021-08-14 (×4): qty 2

## 2021-08-14 MED ORDER — DILTIAZEM HCL 30 MG PO TABS
60.0000 mg | ORAL_TABLET | Freq: Four times a day (QID) | ORAL | Status: DC
  Administered 2021-08-14 – 2021-08-15 (×4): 60 mg via ORAL
  Filled 2021-08-14: qty 1
  Filled 2021-08-14: qty 2
  Filled 2021-08-14: qty 1
  Filled 2021-08-14: qty 2
  Filled 2021-08-14: qty 1

## 2021-08-14 MED ORDER — MELATONIN 5 MG PO TABS
2.5000 mg | ORAL_TABLET | Freq: Every day | ORAL | Status: DC
  Administered 2021-08-14 – 2021-08-15 (×2): 2.5 mg via ORAL
  Filled 2021-08-14 (×2): qty 1

## 2021-08-14 MED ORDER — FUROSEMIDE 10 MG/ML IJ SOLN
20.0000 mg | Freq: Once | INTRAMUSCULAR | Status: AC
  Administered 2021-08-14: 20 mg via INTRAVENOUS
  Filled 2021-08-14: qty 4

## 2021-08-14 MED ORDER — APIXABAN 5 MG PO TABS: 5.0000 mg | ORAL_TABLET | Freq: Two times a day (BID) | ORAL | Status: DC

## 2021-08-14 MED ORDER — APIXABAN 5 MG PO TABS
5.0000 mg | ORAL_TABLET | Freq: Two times a day (BID) | ORAL | Status: DC
  Administered 2021-08-14 – 2021-08-16 (×5): 5 mg via ORAL
  Filled 2021-08-14 (×5): qty 1

## 2021-08-14 MED ORDER — ESCITALOPRAM OXALATE 10 MG PO TABS
10.0000 mg | ORAL_TABLET | Freq: Every day | ORAL | Status: DC
Start: 2021-08-14 — End: 2021-08-16
  Administered 2021-08-14 – 2021-08-16 (×3): 10 mg via ORAL
  Filled 2021-08-14 (×3): qty 1

## 2021-08-14 MED ORDER — NAPROXEN 500 MG PO TABS
500.0000 mg | ORAL_TABLET | Freq: Two times a day (BID) | ORAL | Status: DC
  Administered 2021-08-14 – 2021-08-16 (×5): 500 mg via ORAL
  Filled 2021-08-14 (×5): qty 1

## 2021-08-14 MED ORDER — DIVALPROEX SODIUM 500 MG PO DR TAB
1500.0000 mg | DELAYED_RELEASE_TABLET | Freq: Every day | ORAL | Status: DC
  Administered 2021-08-14 – 2021-08-15 (×2): 1500 mg via ORAL
  Filled 2021-08-14 (×2): qty 3

## 2021-08-14 MED ORDER — FUROSEMIDE 10 MG/ML IJ SOLN: 60.0000 mg | Freq: Once | INTRAMUSCULAR | Status: DC

## 2021-08-14 MED ORDER — PALIPERIDONE ER 3 MG PO TB24
9.0000 mg | ORAL_TABLET | Freq: Every morning | ORAL | Status: DC
  Administered 2021-08-14 – 2021-08-16 (×3): 9 mg via ORAL
  Filled 2021-08-14 (×3): qty 3

## 2021-08-14 MED ORDER — METOPROLOL TARTRATE 50 MG PO TABS
100.0000 mg | ORAL_TABLET | Freq: Two times a day (BID) | ORAL | Status: DC
  Administered 2021-08-14 – 2021-08-16 (×4): 100 mg via ORAL
  Filled 2021-08-14 (×5): qty 2

## 2021-08-14 MED ORDER — ACETAMINOPHEN 325 MG PO TABS
650.0000 mg | ORAL_TABLET | ORAL | Status: DC | PRN
  Administered 2021-08-14: 650 mg via ORAL
  Filled 2021-08-14: qty 2

## 2021-08-14 MED ORDER — PANTOPRAZOLE SODIUM 40 MG PO TBEC
40.0000 mg | DELAYED_RELEASE_TABLET | Freq: Every day | ORAL | Status: DC
Start: 2021-08-14 — End: 2021-08-16
  Administered 2021-08-14 – 2021-08-16 (×3): 40 mg via ORAL
  Filled 2021-08-14 (×3): qty 1

## 2021-08-14 MED ORDER — HYDROCODONE-ACETAMINOPHEN 5-325 MG PO TABS
1.0000 | ORAL_TABLET | ORAL | Status: DC | PRN
  Administered 2021-08-14 – 2021-08-15 (×4): 1 via ORAL
  Filled 2021-08-14 (×4): qty 1

## 2021-08-14 MED ORDER — PREDNISONE 20 MG PO TABS
40.0000 mg | ORAL_TABLET | Freq: Every day | ORAL | Status: DC
  Administered 2021-08-14 – 2021-08-16 (×3): 40 mg via ORAL
  Filled 2021-08-14 (×3): qty 2

## 2021-08-14 MED ORDER — ONDANSETRON HCL 4 MG/2ML IJ SOLN
4.0000 mg | Freq: Four times a day (QID) | INTRAMUSCULAR | Status: DC | PRN
Start: 2021-08-14 — End: 2021-08-16

## 2021-08-14 NOTE — ED Notes (Signed)
Pt given chocolate milk and set up with breakfast tray ?

## 2021-08-14 NOTE — ED Notes (Signed)
Secretary asked to order hospital bed for patient at this time ?

## 2021-08-14 NOTE — Progress Notes (Signed)
?   08/14/21 0300  ?Clinical Encounter Type  ?Visited With Patient;Health care provider  ?Visit Type Initial;Social support;ED  ?Spiritual Encounters  ?Spiritual Needs Prayer  ? ?Chaplain was paged to ED to provide support, comfort and conversation to patient. Patient was quite worried about heart rate. During conversation patient noted that heart rate went down from 146 bpm to 90 bpm. ?

## 2021-08-14 NOTE — ED Notes (Signed)
Patient on toilet at this time 

## 2021-08-14 NOTE — Consult Note (Addendum)
[facility] Consult  ? ?Reason for Consult: Suicidal Ideation ?Referring Physician:  Dr Fran LowesLai ?Patient Identification: Daniel [name] ?MRN:  [id number] ?Principal Diagnosis: Rapid atrial fibrillation (HCC) ?Diagnosis:  Principal Problem: ?  Rapid atrial fibrillation (HCC) ?Active Problems: ?  Schizoaffective disorder, bipolar type (HCC) ?  Obesity, Class III, BMI 40-49.9 (morbid obesity) (HCC) ?  Current use of long term anticoagulation ?  OSA (obstructive sleep apnea) ?  Diabetes mellitus type 2, uncomplicated (HCC) ?  Gout flare ? ? ?Total Time spent with patient: 45 minutes ? ?Subjective:   ?Daniel [name] is a 31 y.o. male patient admitted with chest pain and gout. The patient reported that he was upset because of his physical pain yesterday and felt like stabbing himself with a fork because of it.  No current suicidal ideations or intent or plan.  He recently moved to [place] group home in [place] that he does likeand needs a new provider, [facility] information placed in his discharge instructions.  He does not like Depakote as he has to get labs, requests a mood stabilizer that does not require labs.  Educated the client on this being an outpatient decision with a provider who can follow him.  Depakote level ordered and 73, WDL. He then asked about getting approval for an emotional support animal, referred him to his outpatient provider.  No medication changes warranted at this time.  Denies suicidal/homicidal ideations, hallucinations, and substance abuse.  Psychiatrically stable, follow up with [facility]. ? ?HPI:  Suicidal Ideation ? ?Past Psychiatric History: Schizoaffective disorder, bipolar type. ? ?Risk to Self:  none ?Risk to Others:  none ?Prior Inpatient Therapy:  yes ?Prior Outpatient Therapy:  [facility] [place] ? ?Past Medical History:  ?Past Medical History:  ?Diagnosis Date  ? Atrial fibrillation (HCC)   ? Current use of long term anticoagulation   ? Diabetes mellitus type 2, uncomplicated  (HCC)   ? Hyperlipidemia   ? Hypertension   ? OSA (obstructive sleep apnea)   ? TIA (transient ischemic attack)   ? History reviewed. No pertinent surgical history. ?Family History: History reviewed. No pertinent family history. ?Family Psychiatric  History: schizoaffective disorder ?Social History:  ?Social History  ? ?Substance and Sexual Activity  ?Alcohol Use Not Currently  ?   ?Social History  ? ?Substance and Sexual Activity  ?Drug Use Not on file  ?  ?Social History  ? ?Socioeconomic History  ? Marital status: Single  ?  Spouse name: Not on file  ? Number of children: Not on file  ? Years of education: Not on file  ? Highest education level: Not on file  ?Occupational History  ? Not on file  ?Tobacco Use  ? Smoking status: Former  ?  Types: Cigarettes  ? Smokeless tobacco: Never  ?Substance and Sexual Activity  ? Alcohol use: Not Currently  ? Drug use: Not on file  ? Sexual activity: Not on file  ?Other Topics Concern  ? Not on file  ?Social History Narrative  ? Not on file  ? ?Social Determinants of Health  ? ?Financial Resource Strain: Not on file  ?Food Insecurity: Not on file  ?Transportation Needs: Not on file  ?Physical Activity: Not on file  ?Stress: Not on file  ?Social Connections: Not on file  ? ?Additional Social History: ?  ? ?Allergies:   ?Allergies  ?Allergen Reactions  ? Haldol [Haloperidol] Other (See Comments)  ?  SI  ? Abilify [Aripiprazole] Palpitations  ? Demerol [Meperidine Hcl] Hives  ? ? ?  Labs:  ?Results for orders placed or performed during the hospital encounter of 08/13/21 (from the past 48 hour(s))  ?Magnesium     Status: None  ? Collection Time: 08/13/21 11:11 PM  ?Result Value Ref Range  ? Magnesium 2.0 1.7 - 2.4 mg/dL  ?  Comment: Performed at [facility], [address], [place], [place] [address]  ?CBC with Differential/Platelet     Status: Abnormal  ? Collection Time: 08/13/21 11:11 PM  ?Result Value Ref Range  ? WBC 6.9 4.0 - 10.5 K/uL  ? RBC 5.05 4.22 - 5.81  MIL/uL  ? Hemoglobin 14.3 13.0 - 17.0 g/dL  ? HCT 43.7 39.0 - 52.0 %  ? MCV 86.5 80.0 - 100.0 fL  ? MCH 28.3 26.0 - 34.0 pg  ? MCHC 32.7 30.0 - 36.0 g/dL  ? RDW 13.5 11.5 - 15.5 %  ? Platelets 259 150 - 400 K/uL  ? nRBC 0.0 0.0 - 0.2 %  ? Neutrophils Relative % 59 %  ? Neutro Abs 4.1 1.7 - 7.7 K/uL  ? Lymphocytes Relative 22 %  ? Lymphs Abs 1.5 0.7 - 4.0 K/uL  ? Monocytes Relative 15 %  ? Monocytes Absolute 1.1 (H) 0.1 - 1.0 K/uL  ? Eosinophils Relative 1 %  ? Eosinophils Absolute 0.1 0.0 - 0.5 K/uL  ? Basophils Relative 1 %  ? Basophils Absolute 0.0 0.0 - 0.1 K/uL  ? Immature Granulocytes 2 %  ? Abs Immature Granulocytes 0.13 (H) 0.00 - 0.07 K/uL  ?  Comment: Performed at [facility], [address], [place], [place] [address]  ?Comprehensive metabolic panel     Status: Abnormal  ? Collection Time: 08/13/21 11:11 PM  ?Result Value Ref Range  ? Sodium 138 135 - 145 mmol/L  ? Potassium 3.6 3.5 - 5.1 mmol/L  ? Chloride 93 (L) 98 - 111 mmol/L  ? CO2 31 22 - 32 mmol/L  ? Glucose, Bld 128 (H) 70 - 99 mg/dL  ?  Comment: Glucose reference range applies only to samples taken after fasting for at least 8 hours.  ? BUN 13 6 - 20 mg/dL  ? Creatinine, Ser 1.02 0.61 - 1.24 mg/dL  ? Calcium 9.2 8.9 - 10.3 mg/dL  ? Total Protein 8.1 6.5 - 8.1 g/dL  ? Albumin 3.6 3.5 - 5.0 g/dL  ? AST 19 15 - 41 U/L  ? ALT 13 0 - 44 U/L  ? Alkaline Phosphatase 63 38 - 126 U/L  ? Total Bilirubin 0.6 0.3 - 1.2 mg/dL  ? GFR, Estimated >60 >60 mL/min  ?  Comment: (NOTE) ?Calculated using the CKD-EPI Creatinine Equation (2021) ?  ? Anion gap 14 5 - 15  ?  Comment: Performed at [facility], [address] [place], [place] [address]  ?Troponin I (High Sensitivity)     Status: None  ? Collection Time: 08/13/21 11:11 PM  ?Result Value Ref Range  ? Troponin I (High Sensitivity) 5 <18 ng/L  ?  Comment: (NOTE) ?Elevated high sensitivity troponin I (hsTnI) values and significant  ?changes across serial measurements may suggest ACS but  many other  ?chronic and acute conditions are known to elevate hsTnI results.  ?Refer to the "Links" section for chest pain algorithms and additional  ?guidance. ?Performed at [facility], [address] [place], ?[place] [address] ?  ?Brain natriuretic peptide     Status: Abnormal  ? Collection Time: 08/13/21 11:25 PM  ?Result Value Ref Range  ?  B Natriuretic Peptide 235.4 (H) 0.0 - 100.0 pg/mL  ?  Comment: Performed at [facility], [address], [place], [place] [address]  ?Resp Panel by RT-PCR (Flu A&B, Covid) Nasopharyngeal Swab     Status: None  ? Collection Time: 08/13/21 11:25 PM  ? Specimen: Nasopharyngeal Swab; Nasopharyngeal(NP) swabs in vial transport medium  ?Result Value Ref Range  ? SARS Coronavirus 2 by RT PCR NEGATIVE NEGATIVE  ?  Comment: (NOTE) ?SARS-CoV-2 target nucleic acids are NOT DETECTED. ? ?The SARS-CoV-2 RNA is generally detectable in upper respiratory ?specimens during the acute phase of infection. The lowest ?concentration of SARS-CoV-2 viral copies this assay can detect is ?138 copies/mL. A negative result does not preclude SARS-Cov-2 ?infection and should not be used as the sole basis for treatment or ?other patient management decisions. A negative result may occur with  ?improper specimen collection/handling, submission of specimen other ?than nasopharyngeal swab, presence of viral mutation(s) within the ?areas targeted by this assay, and inadequate number of viral ?copies(<138 copies/mL). A negative result must be combined with ?clinical observations, patient history, and epidemiological ?information. The expected result is Negative. ? ?Fact Sheet for Patients:  ?BloggerCourse.com ? ?Fact Sheet for Healthcare Providers:  ?SeriousBroker.it ? ?This test is no t yet approved or cleared by the [place] FDA and  ?has been authorized for detection and/or diagnosis of SARS-CoV-2 by ?FDA under an Emergency Use  Authorization (EUA). This EUA will remain  ?in effect (meaning this test can be used) for the duration of the ?COVID-19 declaration under Section 564(b)(1) of the Act, 21 ?U.S.C.section 360bbb-3(b)(1), unless th

## 2021-08-14 NOTE — Assessment & Plan Note (Addendum)
--  Patient was recently discharged on 3/9 on Cardizem CD 300 mg daily and metoprolol 100 mg twice daily ?--Was not a candidate for ablation based on recent consult with EP, Dr. Lalla Brothers ?--started on dilt gtt with rate controlled already next morning, so transitioned to oral rate control agents. ?Plan: ?--cont home metop 100 mg BID ?--increase dilt to 90 mg q6h ?--cont Eliquis ?

## 2021-08-14 NOTE — Assessment & Plan Note (Signed)
-   Continue Eliquis 

## 2021-08-14 NOTE — Assessment & Plan Note (Signed)
Continue Invega and other home meds ?Resident of a group home. ?

## 2021-08-14 NOTE — ED Notes (Addendum)
RN to bedside to introduce self to pt. Pt is not currently on the cardiac monitor, and is here for AFIB. This RN placed pt back on cardiac monitor. Pt also found with O2 sat of 78-80 %. Pt appears to be snoring. Pt having a hard time staying awake. Pt arousal to voice. Pt advised he wears oxygen at home as needed and has sleep apnea.  ?

## 2021-08-14 NOTE — Assessment & Plan Note (Signed)
--  Pt reported wearing 2L O2 since his COVID infection. ?--RN noted pt desat 78-80 % while sleeping. ?Plan: ?--Continue supplemental O2 to keep sats >=92%, wean as tolerated ? ? ?

## 2021-08-14 NOTE — Assessment & Plan Note (Addendum)
BMI 47.  Complicating factor to overall prognosis and care ?

## 2021-08-14 NOTE — ED Notes (Signed)
Pt complaint of pain in foot 7/10. Asked for morphine. RN advised he has PRN tylenol ordered and he can have that and he agreed.  ?

## 2021-08-14 NOTE — Assessment & Plan Note (Addendum)
--  pt said his moods have not been stable, and currently has no outpatient psych followup, requested to see psych while inpatient. ?--inpatient psych consulted and rec continuing home regimen ?Plan: ?Continue Invega 9 mg daily ?Continue Lexapro 10 mg daily ?Continue Depakote 1000 mg in the am and 1500 mg in the pm ?Follow up with [facility] ?

## 2021-08-14 NOTE — ED Notes (Signed)
Patient given icecream and graham crackers ?

## 2021-08-14 NOTE — Assessment & Plan Note (Addendum)
--  started prednisone 40 mg daily and schedule naproxen with improvement the next day ?--uric acid 10.0 ?Plan: ?--cont prednisone 40 mg daily ?--cont naproxen 500 mg BID ?--Norco PRN ?

## 2021-08-14 NOTE — H&P (Signed)
?History and Physical  ? ? ?Patient: Daniel [name] MPN:[id number] DOB: 03-Jul-1990 ?DOA: 08/13/2021 ?DOS: the patient was seen and examined on 08/14/2021 ?PCP: Pcp, No  ?Patient coming from:  Group home ? ?Chief Complaint:  ?Chief Complaint  ?Patient presents with  ? Chest Pain  ? Gout  ? ? ?HPI: Daniel [name] is a 31 y.o. 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  Patient was seen the day prior in the ED for gout flare of his left big toe and was in rapid A-fib at that time treated with an IV diltiazem bolus.  He now returns with chest pain.  He also complains of persistent pain left great toe from gout. ?ED course: On arrival in rapid A-fib at 144 with otherwise normal vitals.  Troponin 3, BNP 235, potassium 3.6, magnesium 2.  Labs otherwise unremarkable.  Chest x-ray with no active disease.  EKG personally viewed and interpreted, a flutter at 144.  Patient was treated with diltiazem 20 mg IV as well as furosemide 20 mg IV, morphine and Zofran and subsequently placed on diltiazem infusion.  Hospitalist consulted for admission.  ? ?Review of Systems: As mentioned in the history of present illness. All other systems reviewed and are negative. ?Past Medical History:  ?Diagnosis Date  ? Atrial fibrillation (HCC)   ? Current use of long term anticoagulation   ? Diabetes mellitus type 2, uncomplicated (HCC)   ? Hyperlipidemia   ? Hypertension   ? OSA (obstructive sleep apnea)   ? TIA (transient ischemic attack)   ? ?History reviewed. No pertinent surgical history. ?Social  History:  reports that he has quit smoking. His smoking use included cigarettes. He has never used smokeless tobacco. He reports that he does not currently use alcohol. No history on file for drug use. ? ?Allergies  ?Allergen Reactions  ? Haldol [Haloperidol] Other (See Comments)  ?  SI  ? Abilify [Aripiprazole] Palpitations  ? Demerol [Meperidine Hcl] Hives  ? ? ?History reviewed. No pertinent family history. ? ?Prior to Admission medications   ?Medication Sig Start Date End Date Taking? Authorizing Provider  ?albuterol (VENTOLIN HFA) 108 (90 Base) MCG/ACT inhaler Inhale 2 puffs into the lungs every 6 (six) hours as needed for wheezing. 07/12/21   [provider]  ?allopurinol (ZYLOPRIM) 100 MG tablet Take 100 mg by mouth daily. 06/01/21   [provider]  ?aspirin 325 MG tablet Take 325 mg by mouth every morning. ?Patient not taking: Reported on 08/03/2021 06/01/21   [provider]  ?atorvastatin (LIPITOR) 80 MG tablet Take 80 mg by mouth daily. 05/11/21   [provider]  ?baclofen (LIORESAL) 10 MG tablet Take 10 mg by mouth 2 (two) times daily.    [provider]  ?cyclobenzaprine (FLEXERIL) 5 MG tablet Take 5 mg by mouth 3 (three) times daily. 06/16/21   [provider]  ?diltiazem (CARTIA XT) 300 MG 24 hr capsule Take 1 capsule (300 mg total) by mouth daily. 08/05/21   Enedina Finner, MD  ?divalproex (DEPAKOTE) 500 MG DR tablet Take 1,000-1,500 mg by mouth 2 (  two) times daily. 1000 mg every morning and 1500 mg at bedtime 07/21/21   [provider]  ?ELIQUIS 5 MG TABS tablet Take 5 mg by mouth 2 (two) times daily. 06/16/21   [provider]  ?escitalopram (LEXAPRO) 10 MG tablet Take 10 mg by mouth daily. 06/16/21   [provider]  ?fluticasone (FLONASE) 50 MCG/ACT nasal spray Place 2 sprays into both nostrils 2 (two) times daily. 06/16/21   [provider]  ?hydrochlorothiazide (HYDRODIURIL) 25 MG tablet Take 25 mg by mouth every  morning. 06/16/21   [provider]  ?hydrOXYzine (ATARAX) 50 MG tablet Take 25-50 mg by mouth 3 (three) times daily. 50 mg every morning and at bedtime. 25 mg at noon 07/21/21   [provider]  ?INVEGA 9 MG 24 hr tablet Take 9 mg by mouth every morning. 07/21/21   [provider]  ?loratadine (CLARITIN) 10 MG tablet Take 10 mg by mouth daily. 06/16/21   [provider]  ?melatonin 3 MG TABS tablet Take 3 mg by mouth at bedtime. 06/08/21   [provider]  ?metoprolol tartrate (LOPRESSOR) 100 MG tablet Take 100 mg by mouth 2 (two) times daily. 05/11/21   [provider]  ?omeprazole (PRILOSEC) 20 MG capsule Take 20 mg by mouth daily.    [provider]  ?promethazine (PHENERGAN) 25 MG tablet Take 25 mg by mouth every 6 (six) hours as needed for nausea or vomiting.    [provider]  ?traMADol (ULTRAM) 50 MG tablet Take 1 tablet (50 mg total) by mouth every 6 (six) hours as needed. 08/12/21 08/12/22  Willy Eddyobinson, Patrick, MD  ? ? ?Physical Exam: ?Vitals:  ? 08/13/21 2310 08/13/21 2311 08/13/21 2346  ?BP:  119/86 109/71  ?Pulse:  (!) 144 (!) 118  ?Resp:  20 18  ?Temp:  98.3 ?F (36.8 ?C)   ?TempSrc:  Oral   ?SpO2:  93% 94%  ?Weight: (!) 181.4 kg    ?Height: 6\' 5"  (1.956 m)    ? ?Physical Exam ?Vitals and nursing note reviewed.  ?Constitutional:   ?   General: He is not in acute distress. ?   Appearance: Normal appearance.  ?HENT:  ?   Head: Normocephalic and atraumatic.  ?Cardiovascular:  ?   Rate and Rhythm: Tachycardia present. Rhythm irregular.  ?   Pulses: Normal pulses.  ?   Heart sounds: Normal heart sounds. No murmur heard. ?Pulmonary:  ?   Effort: Pulmonary effort is normal.  ?   Breath sounds: Normal breath sounds. No wheezing or rhonchi.  ?Abdominal:  ?   General: Bowel sounds are normal.  ?   Palpations: Abdomen is soft.  ?   Tenderness: There is no abdominal tenderness.  ?Musculoskeletal:     ?   General: No swelling or tenderness.  ?    Cervical back: Normal range of motion and neck supple.  ?Skin: ?   General: Skin is warm and dry.  ?Neurological:  ?   General: No focal deficit present.  ?   Mental Status: He is alert. Mental status is at baseline.  ?Psychiatric:     ?   Mood and Affect: Mood normal.     ?   Behavior: Behavior normal.  ? ? ? ?Data Reviewed: ?Relevant notes from primary care and specialist visits, past discharge summaries as available in EHR, including Care Everywhere. ?Prior diagnostic testing as pertinent to current admission diagnoses ?Updated medications and problem lists for reconciliation ?ED course, including  vitals, labs, imaging, treatment and response to treatment ?Triage notes, nursing and pharmacy notes and ED provider's notes ?Notable results as noted in HPI ? ? ?Assessment and Plan: ?* Rapid atrial fibrillation (HCC) ?Continue diltiazem infusion to transition to oral. ?Patient was recently discharged on 3/9 on Cardizem CD 300 mg daily and metoprolol 100 mg twice daily which we will hold at this time ?Was not a candidate for ablation based on recent consult with EP, Dr. Lalla Brothers ?Consider cardiology consult in the a.m. to discuss options given that patient already on high-dose AV nodal blockers ? ?Gout flare ?Continue colchicine ? ?Current use of long term anticoagulation ?Continue Eliquis ? ?Psychotic disorder (HCC) ?Continue Invega and other home meds ?Resident of a group home. ? ?Diabetes mellitus type 2, uncomplicated (HCC) ?Sliding scale insulin coverage ? ?OSA (obstructive sleep apnea) ?CPAP if desired ? ?Obesity, Class III, BMI 40-49.9 (morbid obesity) (HCC) ?BMI 47.  Complicating factor to overall prognosis and care ? ? ? ? ? ? ?Advance Care Planning:   Code Status: Full Code  ? ?Consults: none ? ?Family Communication: none ? ?Severity of Illness: ?The appropriate patient status for this patient is INPATIENT. Inpatient status is judged to be reasonable and necessary in order to provide the required intensity of  service to ensure the patient's safety. The patient's presenting symptoms, physical exam findings, and initial radiographic and laboratory data in the context of their chronic comorbidities is felt to place them at h

## 2021-08-14 NOTE — ED Notes (Signed)
Pt ambulated to the bathroom at this time, 1 assist with walker.  Denies weakness or dizziness. ?

## 2021-08-14 NOTE — ED Notes (Signed)
Pt assisted to use toilet in room  at this time. Linens changed and pt placed back in bed without difficulty.  ?

## 2021-08-14 NOTE — Assessment & Plan Note (Addendum)
--  currently working on getting CPAP at home ?

## 2021-08-14 NOTE — Progress Notes (Signed)
?  Progress Note ? ? ?Patient: Daniel [name] MOQ:[id number] DOB: 05-Nov-1990 DOA: 08/13/2021     0 ?DOS: the patient was seen and examined on 08/14/2021 ?  ?Brief hospital course: ?No notes on file ? ?Assessment and Plan: ?* Rapid atrial fibrillation (HCC) ?--Patient was recently discharged on 3/9 on Cardizem CD 300 mg daily and metoprolol 100 mg twice daily ?--Was not a candidate for ablation based on recent consult with EP, Dr. Lalla Brothers ?--started on dilt gtt with rate controlled already this morning ?Plan: ?--resume home metop 100 mg BID ?--resume dilt as 60 mg q6h ?--wean off dilt gtt ?--cont Eliquis ? ?Schizoaffective disorder, bipolar type (HCC) ?--pt said his moods have not been stable, and currently has no outpatient psych followup, requested to see psych while inpatient. ?Plan: ?--inpatient psych consult today ?--cont home psych meds for now ? ?Gout flare ?--start prednisone 40 mg daily ?--start naproxen 500 mg BID ?--Norco PRN ? ?Current use of long term anticoagulation ?Continue Eliquis ? ?Chronic hypoxemic respiratory failure (HCC) ?--Pt reported wearing 2L O2 since his COVID infection. ?--RN noted pt desat 78-80 % while sleeping. ?Plan: ?--Continue supplemental O2 to keep sats >=92%, wean as tolerated ? ? ? ?Diabetes mellitus type 2, uncomplicated (HCC) ?--last A1c 5.9 ?--no need for SSI coverage ? ?OSA (obstructive sleep apnea) ?--currently working on getting CPAP at home ? ?Obesity, Class III, BMI 40-49.9 (morbid obesity) (HCC) ?BMI 47.  Complicating factor to overall prognosis and care ? ?Psychotic disorder (HCC)-resolved as of 08/14/2021 ?Continue Invega and other home meds ?Resident of a group home. ? ? ? ? ?  ? ?Subjective:  ?Pt complained that his whole left foot hurt.  Also complained of his moods not being stable and asked to see psych. ? ?HR controlled, dilt gtt weaned off. ? ? ?Physical Exam: ?Constitutional: NAD, appeared sleepy but answers all questions appropriately, oriented ?HEENT:  conjunctivae and lids normal, EOMI ?CV: No cyanosis.   ?RESP: normal respiratory effort, on 2L ?Extremities: left foot swollen  ?SKIN: warm, dry ?Neuro: II - XII grossly intact.   ? ? ? ? ? ?Data Reviewed: ? ?Family Communication:  ? ?Disposition: ?Status is: Inpatient ?Remains inpatient appropriate because: adjusting rate control agents for recurrent presentation of Afib w RVR ? ? Planned Discharge Destination:  group home ? ? ?No charge note. ? ?Author: ?Darlin Priestly, MD ?08/14/2021 5:51 PM ? ?For on call review [email or web address].  ?

## 2021-08-14 NOTE — Assessment & Plan Note (Addendum)
--  last A1c 5.9 ?--no need for SSI coverage ?

## 2021-08-15 DIAGNOSIS — I4891 Unspecified atrial fibrillation: Secondary | ICD-10-CM | POA: Diagnosis not present

## 2021-08-15 LAB — BASIC METABOLIC PANEL
Anion gap: 7 (ref 5–15)
BUN: 16 mg/dL (ref 6–20)
CO2: 34 mmol/L — ABNORMAL HIGH (ref 22–32)
Calcium: 9 mg/dL (ref 8.9–10.3)
Chloride: 97 mmol/L — ABNORMAL LOW (ref 98–111)
Creatinine, Ser: 0.75 mg/dL (ref 0.61–1.24)
GFR, Estimated: >60 mL/min (ref >60)
Glucose, Bld: 157 mg/dL — ABNORMAL HIGH (ref 70–99)
Potassium: 4.3 mmol/L (ref 3.5–5.1)
Sodium: 138 mmol/L (ref 135–145)

## 2021-08-15 LAB — CBC
HCT: 39.9 % (ref 39.0–52.0)
Hemoglobin: 12.7 g/dL — ABNORMAL LOW (ref 13.0–17.0)
MCH: 27.7 pg (ref 26.0–34.0)
MCHC: 31.8 g/dL (ref 30.0–36.0)
MCV: 86.9 fL (ref 80.0–100.0)
Platelets: 266 10*3/uL (ref 150–400)
RBC: 4.59 MIL/uL (ref 4.22–5.81)
RDW: 12.9 % (ref 11.5–15.5)
WBC: 7.6 10*3/uL (ref 4.0–10.5)
nRBC: 0 % (ref 0.0–0.2)

## 2021-08-15 LAB — MAGNESIUM: Magnesium: 2 mg/dL (ref 1.7–2.4)

## 2021-08-15 MED ORDER — FUROSEMIDE 10 MG/ML IJ SOLN
40.0000 mg | Freq: Once | INTRAMUSCULAR | Status: AC
Start: 2021-08-15 — End: 2021-08-15
  Administered 2021-08-15: 40 mg via INTRAVENOUS
  Filled 2021-08-15: qty 4

## 2021-08-15 MED ORDER — HYDROCODONE-ACETAMINOPHEN 5-325 MG PO TABS: 1.0000 | ORAL_TABLET | Freq: Four times a day (QID) | ORAL | Status: DC | PRN

## 2021-08-15 MED ORDER — BACITRACIN ZINC 500 UNIT/GM EX OINT
TOPICAL_OINTMENT | Freq: Every day | CUTANEOUS | Status: DC
  Filled 2021-08-15: qty 0.9

## 2021-08-15 MED ORDER — DILTIAZEM HCL 30 MG PO TABS
60.0000 mg | ORAL_TABLET | Freq: Four times a day (QID) | ORAL | Status: DC
  Administered 2021-08-15 (×2): 60 mg via ORAL
  Filled 2021-08-15: qty 2

## 2021-08-15 MED ORDER — FUROSEMIDE 10 MG/ML IJ SOLN
40.0000 mg | Freq: Once | INTRAMUSCULAR | Status: AC
  Administered 2021-08-15: 40 mg via INTRAVENOUS
  Filled 2021-08-15: qty 4

## 2021-08-15 MED ORDER — DILTIAZEM HCL 30 MG PO TABS
90.0000 mg | ORAL_TABLET | Freq: Four times a day (QID) | ORAL | Status: DC
  Administered 2021-08-15 – 2021-08-16 (×2): 90 mg via ORAL
  Filled 2021-08-15 (×3): qty 3

## 2021-08-15 MED ORDER — DILTIAZEM HCL 30 MG PO TABS: 90.0000 mg | ORAL_TABLET | Freq: Four times a day (QID) | ORAL | Status: DC

## 2021-08-15 NOTE — Hospital Course (Signed)
Daniel [name] is a 31 y.o. 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  Patient was seen the day prior in the ED for gout flare of his left big toe and was in rapid A-fib at that time treated with an IV diltiazem bolus.  He now returns with chest pain.  He also complains of persistent pain left great toe from gout. ?

## 2021-08-15 NOTE — Progress Notes (Signed)
?  Progress Note ? ? ?Patient: Daniel [name] DJM:[id number] DOB: 1990-11-11 DOA: 08/13/2021     1 ?DOS: the patient was seen and examined on 08/15/2021 ?  ?Brief hospital course: ?Manoj Enriquez is a 31 y.o. 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  Patient was seen the day prior in the ED for gout flare of his left big toe and was in rapid A-fib at that time treated with an IV diltiazem bolus.  He now returns with chest pain.  He also complains of persistent pain left great toe from gout. ? ?Assessment and Plan: ?* Rapid atrial fibrillation (HCC) ?--Patient was recently discharged on 3/9 on Cardizem CD 300 mg daily and metoprolol 100 mg twice daily ?--Was not a candidate for ablation based on recent consult with EP, Dr. Lalla Brothers ?--started on dilt gtt with rate controlled already next morning, so transitioned to oral rate control agents. ?Plan: ?--cont home metop 100 mg BID ?--increase dilt to 90 mg q6h ?--cont Eliquis ? ?Schizoaffective disorder, bipolar type (HCC) ?--pt said his moods have not been stable, and currently has no outpatient psych followup, requested to see psych while inpatient. ?--inpatient psych consulted and rec continuing home regimen ?Plan: ?Continue Invega 9 mg daily ?Continue Lexapro 10 mg daily ?Continue Depakote 1000 mg in the am and 1500 mg in the pm ?Follow up with [facility] ? ?Gout flare ?--started prednisone 40 mg daily and schedule naproxen with improvement the next day ?--uric acid 10.0 ?Plan: ?--cont prednisone 40 mg daily ?--cont naproxen 500 mg  BID ?--Norco PRN ? ?Current use of long term anticoagulation ?Continue Eliquis ? ?Chronic hypoxemic respiratory failure (HCC) ?--Pt reported wearing 2L O2 since his COVID infection. ?--RN noted pt desat 78-80 % while sleeping. ?Plan: ?--Continue supplemental O2 to keep sats >=92%, wean as tolerated ? ? ? ?Diabetes mellitus type 2, uncomplicated (HCC) ?--last A1c 5.9 ?--no need for SSI coverage ? ?OSA (obstructive sleep apnea) ?--currently working on getting CPAP at home ? ?Obesity, Class III, BMI 40-49.9 (morbid obesity) (HCC) ?BMI 47.  Complicating factor to overall prognosis and care ? ?Psychotic disorder (HCC)-resolved as of 08/14/2021 ?Continue Invega and other home meds ?Resident of a group home. ? ? ? ? ?  ? ?Subjective:  ?Pt reported that he ripped off an ingrown toe nail and it bled a little.  Erythema and pain improved in his left foot. ? ? ?Physical Exam: ?Constitutional: NAD, AAOx3 ?HEENT: conjunctivae and lids normal, EOMI ?CV: No cyanosis.   ?RESP: normal respiratory effort, on RA ?Extremities: Left foot erythema improved, still swollen ?SKIN: warm, dry ?Neuro: II - XII grossly intact.   ?Psych: Normal mood and affect.   ? ? ?Data Reviewed: ? ?Family Communication:  ? ?Disposition: ?Status is: Inpatient ?Remains inpatient appropriate because: adjusting rate control agents for recurrent presentation of Afib w RVR ? ? Planned Discharge Destination:  group home ? ? ? ? ?Author: ?Darlin Priestly, MD ?08/15/2021 4:13 PM ? ?For on call review [email or web address]  ?

## 2021-08-15 NOTE — Plan of Care (Signed)

## 2021-08-16 DIAGNOSIS — I4891 Unspecified atrial fibrillation: Secondary | ICD-10-CM | POA: Diagnosis not present

## 2021-08-16 LAB — BASIC METABOLIC PANEL
Anion gap: 8 (ref 5–15)
BUN: 20 mg/dL (ref 6–20)
CO2: 34 mmol/L — ABNORMAL HIGH (ref 22–32)
Calcium: 8.9 mg/dL (ref 8.9–10.3)
Chloride: 96 mmol/L — ABNORMAL LOW (ref 98–111)
Creatinine, Ser: 0.85 mg/dL (ref 0.61–1.24)
GFR, Estimated: >60 mL/min (ref >60)
Glucose, Bld: 144 mg/dL — ABNORMAL HIGH (ref 70–99)
Potassium: 4.2 mmol/L (ref 3.5–5.1)
Sodium: 138 mmol/L (ref 135–145)

## 2021-08-16 LAB — CBC
HCT: 42.5 % (ref 39.0–52.0)
Hemoglobin: 13.8 g/dL (ref 13.0–17.0)
MCH: 28 pg (ref 26.0–34.0)
MCHC: 32.5 g/dL (ref 30.0–36.0)
MCV: 86.4 fL (ref 80.0–100.0)
Platelets: 284 10*3/uL (ref 150–400)
RBC: 4.92 MIL/uL (ref 4.22–5.81)
RDW: 13.3 % (ref 11.5–15.5)
WBC: 10.5 10*3/uL (ref 4.0–10.5)
nRBC: 0 % (ref 0.0–0.2)

## 2021-08-16 LAB — MAGNESIUM: Magnesium: 2 mg/dL (ref 1.7–2.4)

## 2021-08-16 MED ORDER — PREDNISONE 10 MG PO TABS: ORAL_TABLET | ORAL | 0 refills | Status: DC

## 2021-08-16 MED ORDER — CYCLOBENZAPRINE HCL 5 MG PO TABS
5.0000 mg | ORAL_TABLET | Freq: Three times a day (TID) | ORAL | 0 refills | Status: DC | PRN
Start: 2021-08-16 — End: 2021-08-29

## 2021-08-16 MED ORDER — NAPROXEN 500 MG PO TABS
500.0000 mg | ORAL_TABLET | Freq: Two times a day (BID) | ORAL | 0 refills | Status: AC
Start: 2021-08-16 — End: 2021-08-21

## 2021-08-16 MED ORDER — DILTIAZEM HCL ER COATED BEADS 360 MG PO CP24: 360.0000 mg | ORAL_CAPSULE | Freq: Every day | ORAL | 2 refills | Status: DC

## 2021-08-16 MED ORDER — BACLOFEN 10 MG PO TABS: 10.0000 mg | ORAL_TABLET | Freq: Two times a day (BID) | ORAL | 0 refills | Status: DC | PRN

## 2021-08-16 MED ORDER — DILTIAZEM HCL ER COATED BEADS 180 MG PO CP24
360.0000 mg | ORAL_CAPSULE | Freq: Every day | ORAL | Status: DC
  Administered 2021-08-16: 360 mg via ORAL
  Filled 2021-08-16: qty 2

## 2021-08-16 MED ORDER — METOPROLOL TARTRATE 100 MG PO TABS
100.0000 mg | ORAL_TABLET | Freq: Two times a day (BID) | ORAL | 2 refills | Status: DC
Start: 2021-08-16 — End: 2021-08-29

## 2021-08-16 NOTE — TOC Transition Note (Signed)
Transition of Care (TOC) - CM/SW Discharge Note ? ? ?Patient Details  ?Name: Daniel [name] ?MRN: [id number] ?Date of Birth: November 07, 1990 ? ?Transition of Care (TOC) CM/SW Contact:  ?Gildardo Griffes, LCSW ?Phone Number: ?08/16/2021, 8:52 AM ? ? ?Clinical Narrative:    ? ?Patient is from [facility] and has a legal guardian.  ?  ?CSW spoke with [facility] at [phone]. They report they provide transportation for patient to and from appointments and aid in medication assistance.  ?  ?They report to call Kevin Fenton at [phone] when patient is medically stable to discharge and they will provide transportation back to [facility] located at: ?[address]  ?[place] [address] ?  ?TOC will continue to follow for any additional discharge needs. ? ?Final next level of care: Group Home ?Barriers to Discharge: No Barriers Identified ? ? ?Patient Goals and CMS Choice ?Patient states their goals for this hospitalization and ongoing recovery are:: to go home ?CMS Medicare.gov Compare Post Acute Care list provided to:: Patient ?Choice offered to / list presented to : Patient ? ?Discharge Placement ?  ?           ?  ?  ?  ?Patient and family notified of of transfer: 08/16/21 ? ?Discharge Plan and Services ?  ?  ?           ?  ?  ?  ?  ?  ?  ?  ?  ?  ?  ? ?Social Determinants of Health (SDOH) Interventions ?  ? ? ?Readmission Risk Interventions ?No flowsheet data found. ? ? ? ? ?

## 2021-08-16 NOTE — Progress Notes (Signed)
?   08/16/21 1100  ?Clinical Encounter Type  ?Visited With Patient  ?Visit Type Initial  ?Referral From Nurse  ?Consult/Referral To Chaplain  ? ?Chaplain responded to nurse consult. Patient spoke about family and life activities. Chaplain provided reflective listening. Patient appreciated [place] visit. ?

## 2021-08-16 NOTE — Plan of Care (Signed)
  Problem: Activity: Goal: Ability to tolerate increased activity will improve Outcome: Progressing   Problem: Cardiac: Goal: Ability to achieve and maintain adequate cardiopulmonary perfusion will improve Outcome: Progressing   

## 2021-08-16 NOTE — Discharge Summary (Signed)
? ?Physician Discharge Summary ? ? ?Daniel [name]  male DOB: 24-Jul-1990  ?UDJ:[id number] ? ?PCP: Pcp, No ? ?Admit date: 08/13/2021 ?Discharge date: 08/16/2021 ? ?Admitted From: group home ?Disposition:  group home ?CODE STATUS: Full code ? ?Discharge Instructions   ? ? Discharge instructions   Complete by: As directed ?  ? For your atrial fibrillation with rapid heart rate, I have increased your Cardizem from 300 mg to 360 mg daily, and keep your home Metoprolol 100 mg twice daily. ? ?For your gout flare, please finish prednisone taper at home as directed, and take Naproxen for pain for the next 5 days. ? ?Inpatient psychiatry has seen you and recommended keeping your current psych medication regimen.  Please establish with outpatient psychiatry.   ? ?You received the home supplemental oxygen after COVID infection, but currently, you don't need it.  You likely have sleep apnea, but night-time supplemental oxygen is not the best treatment for sleep apnea.  You will need outpatient sleep study to qualify for CPAP. ? ? ?Dr. Darlin Priestly ?- ?-  ? No wound care   Complete by: As directed ?  ? ?  ? ?Hospital Course:  ?For full details, please see H&P, progress notes, consult notes and ancillary notes.  ?Briefly,  ?Daniel [name] is a 31 y.o. male with medical history significant for A-fib on Eliquis, unspecified psychotic disorder on antipsychotics, class III obesity, prior TIA, resident of a group home, recently hospitalized from 3/6 to 08/04/2021 with A-fib with RVR, evaluated by EP cardiology and not deemed to be a candidate for ablation or antiarrhythmic drug due to multiple antipsychotic meds, who presented to the ED with chest pain and found to be in rapid A-fib.   ? ?He also complained of persistent pain left great toe from gout. ?  ?* Rapid atrial fibrillation (HCC) ?Current use of long term anticoagulation ?--Patient was recently discharged on 3/9 on Cardizem CD 300 mg daily and metoprolol 100 mg twice daily ?--Was  not a candidate for ablation based on recent consult with EP, Dr. Lalla Brothers ?--started on dilt gtt with rate controlled already next morning, so transitioned to oral rate control agents. ?--pt was discharged on Cardizem 360 daily and Lopressor 100 mg BID. ?--cont Eliquis ?--outpatient f/u with cardiology ?  ?Schizoaffective disorder, bipolar type (HCC) ?--pt said his moods have not been stable, and currently has no outpatient psych followup, requested to see psych while inpatient. ?--inpatient psych consulted and rec continuing home regimen ?Continue Invega 9 mg daily ?Continue Lexapro 10 mg daily ?Continue Depakote 1000 mg in the am and 1500 mg in the pm ?Follow up with [facility] ?  ?Gout flare ?--started prednisone 40 mg daily and schedule naproxen with improvement already the next day. ?--uric acid 10.0 ?--pt was discharged on a prednisone taper. ?--cont naproxen 500 mg BID for 5 more days. ?--cont home allopurinol ?  ?Hx of chronic hypoxemic respiratory failure (HCC), no longer active ?--Pt reported wearing 2L O2 since his COVID infection. ?--Ambulation test found pt to maintain O2 sats in mid-90's throughout walking, so pt no longer needs supplemental oxygen. ?--Pt was found to desat during sleep, which is due to OSA. ? ?OSA (obstructive sleep apnea) ?--currently working on getting CPAP at home ?  ?Diabetes mellitus type 2, uncomplicated (HCC) ?--last A1c 5.9 ?--no need for SSI coverage ?  ?Obesity, Class III, BMI 40-49.9 (morbid obesity) (HCC) ?BMI 47.  Complicating factor to overall prognosis and care ?  ? ?Discharge Diagnoses:  ?Principal Problem: ?  Rapid atrial fibrillation (HCC) ?Active Problems: ?  Schizoaffective disorder, bipolar type (HCC) ?  Current use of long term anticoagulation ?  Gout flare ?  Obesity, Class III, BMI 40-49.9 (morbid obesity) (HCC) ?  OSA (obstructive sleep apnea) ?  Diabetes mellitus type 2, uncomplicated (HCC) ?  Chronic hypoxemic respiratory failure (HCC) ? ? ?30 Day Unplanned  Readmission Risk Score   ? ?Flowsheet Row ED to Hosp-Admission (Current) from 08/13/2021 in [facility]  ?30 Day Unplanned Readmission Risk Score (%) 11.98 Filed at 08/16/2021 0801  ? ?  ? ? This score is the patient's risk of an unplanned readmission within 30 days of being discharged (0 -100%). The score is based on dignosis, age, lab data, medications, orders, and past utilization.   ?Low:  0-14.9   Medium: 15-21.9   High: 22-29.9   Extreme: 30 and above ? ?  ? ?  ? ? ?Discharge Instructions: ? ?Allergies as of 08/16/2021   ? ?   Reactions  ? Haldol [haloperidol] Other (See Comments)  ? SI  ? Abilify [aripiprazole] Palpitations  ? Demerol [meperidine Hcl] Hives  ? ?  ? ?  ?Medication List  ?  ? ?STOP taking these medications   ? ?aspirin 325 MG tablet ?  ?hydrochlorothiazide 25 MG tablet ?Commonly known as: HYDRODIURIL ?  ?hydrOXYzine 50 MG tablet ?Commonly known as: ATARAX ?  ?traMADol 50 MG tablet ?Commonly known as: Ultram ?  ? ?  ? ?TAKE these medications   ? ?albuterol 108 (90 Base) MCG/ACT inhaler ?Commonly known as: VENTOLIN HFA ?Inhale 2 puffs into the lungs every 6 (six) hours as needed for wheezing. ?  ?allopurinol 100 MG tablet ?Commonly known as: ZYLOPRIM ?Take 100 mg by mouth daily. ?  ?atorvastatin 80 MG tablet ?Commonly known as: LIPITOR ?Take 80 mg by mouth daily. ?  ?baclofen 10 MG tablet ?Commonly known as: LIORESAL ?Take 1 tablet (10 mg total) by mouth 2 (two) times daily as needed for muscle spasms. Home med. ?What changed:  ?when to take this ?reasons to take this ?additional instructions ?  ?cyclobenzaprine 5 MG tablet ?Commonly known as: FLEXERIL ?Take 1 tablet (5 mg total) by mouth 3 (three) times daily as needed for muscle spasms. Home med. ?What changed:  ?when to take this ?reasons to take this ?additional instructions ?  ?diltiazem 360 MG 24 hr capsule ?Commonly known as: CARDIZEM CD ?Take 1 capsule (360 mg total) by mouth daily. ?What changed:  ?medication  strength ?how much to take ?  ?divalproex 500 MG DR tablet ?Commonly known as: DEPAKOTE ?Take 1,000-1,500 mg by mouth 2 (two) times daily. 1000 mg every morning and 1500 mg at bedtime ?  ?Eliquis 5 MG Tabs tablet ?Generic drug: apixaban ?Take 5 mg by mouth 2 (two) times daily. ?  ?escitalopram 10 MG tablet ?Commonly known as: LEXAPRO ?Take 10 mg by mouth daily. ?  ?fluticasone 50 MCG/ACT nasal spray ?Commonly known as: FLONASE ?Place 2 sprays into both nostrils 2 (two) times daily. ?  ?Invega 9 MG 24 hr tablet ?Generic drug: paliperidone ?Take 9 mg by mouth every morning. ?  ?loratadine 10 MG tablet ?Commonly known as: CLARITIN ?Take 10 mg by mouth daily. ?  ?melatonin 3 MG Tabs tablet ?Take 3 mg by mouth at bedtime. ?  ?metoprolol tartrate 100 MG tablet ?Commonly known as: LOPRESSOR ?Take 1 tablet (100 mg total) by mouth 2 (two) times daily. ?  ?naproxen 500 MG tablet ?Commonly known as: NAPROSYN ?  Take 1 tablet (500 mg total) by mouth 2 (two) times daily with a meal for 5 days. ?  ?omeprazole 20 MG capsule ?Commonly known as: PRILOSEC ?Take 20 mg by mouth daily. ?  ?predniSONE 10 MG tablet ?Commonly known as: DELTASONE ?Take 40 mg (4 tablets) from 3/21 to 3/22, then 20 mg (2 tablets) from 3/23 to 3/26, then 10 mg (1 tablet) from 3/27 to 3/30, then done.  For gout. ?  ?promethazine 25 MG tablet ?Commonly known as: PHENERGAN ?Take 25 mg by mouth every 6 (six) hours as needed for nausea or vomiting. ?  ? ?  ? ? ? Follow-up Information   ? ? [facility] Schedule an appointment as soon as possible for a visit in 2 day(s).   ?Why: Call on Monday for an appointment or go to the walk-in clinic at 8:30 am ?Contact information: ?[address] ?[place] [address] ?[phone] ? ? ?  ?  ? ? Lanier PrudeLambert, Cameron T, MD Follow up in 1 week(s).   ?Specialties: Cardiology, Radiology ?Contact information: ?[address] ?[address] ?[place] [address] ?[phone] ? ? ?  ?  ? ? Your PCP Follow up in 2  week(s).   ? ?  ?  ? ?  ?  ? ?  ? ? ?Allergies  ?Allergen Reactions  ? Haldol [Haloperidol] Other (See Comments)  ?  SI  ? Abilify [Aripiprazole] Palpitations  ? Demerol [Meperidine Hcl] Hives  ? ? ? ?The results of sign

## 2021-08-20 ENCOUNTER — Other Ambulatory Visit: Payer: Self-pay

## 2021-08-20 ENCOUNTER — Emergency Department
Admission: EM | Admit: 2021-08-20 | Discharge: 2021-08-21 | Disposition: A | Discharge status: Home / Self Care | Payer: Medicaid Other | Attending: Student in an Organized Health Care Education/Training Program | Admitting: Student in an Organized Health Care Education/Training Program

## 2021-08-20 ENCOUNTER — Encounter: Payer: Self-pay | Admitting: Intensive Care

## 2021-08-20 DIAGNOSIS — F250 Schizoaffective disorder, bipolar type: Secondary | ICD-10-CM | POA: Diagnosis not present

## 2021-08-20 DIAGNOSIS — E119 Type 2 diabetes mellitus without complications: Secondary | ICD-10-CM | POA: Insufficient documentation

## 2021-08-20 DIAGNOSIS — Z7901 Long term (current) use of anticoagulants: Secondary | ICD-10-CM | POA: Diagnosis not present

## 2021-08-20 DIAGNOSIS — R45851 Suicidal ideations: Secondary | ICD-10-CM | POA: Insufficient documentation

## 2021-08-20 DIAGNOSIS — Z87891 Personal history of nicotine dependence: Secondary | ICD-10-CM | POA: Diagnosis not present

## 2021-08-20 DIAGNOSIS — I10 Essential (primary) hypertension: Secondary | ICD-10-CM | POA: Diagnosis not present

## 2021-08-20 DIAGNOSIS — Z20822 Contact with and (suspected) exposure to covid-19: Secondary | ICD-10-CM | POA: Insufficient documentation

## 2021-08-20 DIAGNOSIS — I4891 Unspecified atrial fibrillation: Secondary | ICD-10-CM | POA: Insufficient documentation

## 2021-08-20 DIAGNOSIS — F419 Anxiety disorder, unspecified: Secondary | ICD-10-CM | POA: Diagnosis not present

## 2021-08-20 LAB — COMPREHENSIVE METABOLIC PANEL
ALT: 23 U/L (ref 0–44)
AST: 29 U/L (ref 15–41)
Albumin: 4.1 g/dL (ref 3.5–5.0)
Alkaline Phosphatase: 58 U/L (ref 38–126)
Anion gap: 12 (ref 5–15)
BUN: 26 mg/dL — ABNORMAL HIGH (ref 6–20)
CO2: 30 mmol/L (ref 22–32)
Calcium: 9.6 mg/dL (ref 8.9–10.3)
Chloride: 98 mmol/L (ref 98–111)
Creatinine, Ser: 1.11 mg/dL (ref 0.61–1.24)
GFR, Estimated: >60 mL/min (ref >60)
Glucose, Bld: 115 mg/dL — ABNORMAL HIGH (ref 70–99)
Potassium: 4.9 mmol/L (ref 3.5–5.1)
Sodium: 140 mmol/L (ref 135–145)
Total Bilirubin: 0.8 mg/dL (ref 0.3–1.2)
Total Protein: 8.1 g/dL (ref 6.5–8.1)

## 2021-08-20 LAB — CBC
HCT: 45.5 % (ref 39.0–52.0)
Hemoglobin: 14.9 g/dL (ref 13.0–17.0)
MCH: 28.4 pg (ref 26.0–34.0)
MCHC: 32.7 g/dL (ref 30.0–36.0)
MCV: 86.8 fL (ref 80.0–100.0)
Platelets: 284 10*3/uL (ref 150–400)
RBC: 5.24 MIL/uL (ref 4.22–5.81)
RDW: 13.5 % (ref 11.5–15.5)
WBC: 11.6 10*3/uL — ABNORMAL HIGH (ref 4.0–10.5)
nRBC: 0 % (ref 0.0–0.2)

## 2021-08-20 LAB — URINE DRUG SCREEN, QUALITATIVE (ARMC ONLY)
Amphetamines, Ur Screen: NOT DETECTED
Barbiturates, Ur Screen: NOT DETECTED
Benzodiazepine, Ur Scrn: NOT DETECTED
Cannabinoid 50 Ng, Ur ~~LOC~~: NOT DETECTED
Cocaine Metabolite,Ur ~~LOC~~: NOT DETECTED
MDMA (Ecstasy)Ur Screen: NOT DETECTED
Methadone Scn, Ur: NOT DETECTED
Opiate, Ur Screen: NOT DETECTED
Phencyclidine (PCP) Ur S: NOT DETECTED
Tricyclic, Ur Screen: POSITIVE — AB

## 2021-08-20 LAB — RESP PANEL BY RT-PCR (FLU A&B, COVID) ARPGX2
Influenza A by PCR: NEGATIVE
Influenza B by PCR: NEGATIVE
SARS Coronavirus 2 by RT PCR: NEGATIVE

## 2021-08-20 LAB — ETHANOL: Alcohol, Ethyl (B): <10 mg/dL (ref <10)

## 2021-08-20 LAB — SALICYLATE LEVEL: Salicylate Lvl: <7 mg/dL — ABNORMAL LOW (ref 7.0–30.0)

## 2021-08-20 LAB — ACETAMINOPHEN LEVEL: Acetaminophen (Tylenol), Serum: <10 ug/mL — ABNORMAL LOW (ref 10–30)

## 2021-08-20 NOTE — ED Notes (Signed)
Per [name], patient enjoys being in the hospital.   ? ?Pt has asked to be moved to a room and wanted to go to the cafeteria to get food for himself.  RN explained there are no rooms available and food trays are sent by dietary - no food selection.  ?

## 2021-08-20 NOTE — ED Triage Notes (Signed)
Arrives with [facility] representative with patient.  Patient states "I have a few things on my mind" and endorses SI x 2-3 weeks.  Seen by psychiatry yesterday and states did not mention SI because "[place] far from home" ? ?Patient is awake and alert.  Calm and cooperative.  NAD ?

## 2021-08-20 NOTE — ED Provider Notes (Signed)
? ?[facility] ?Provider Note ? ? ? Event Date/Time  ? First MD Initiated Contact with Patient 08/20/21 1641   ?  (approximate) ? ? ?History  ? ?Mental Health Problem ? ? ?HPI ? ?Daniel [name] is a 31 y.o. male who presents to the ER voluntary with suicidal ideation feeling quite down and hopeless with plan to hang himself.  Patient actually got the belt around his neck and pulled it tight today.  Brought in by group home staff.  States that he feels down and still feels like he needs to be admitted.  Has tried to harm himself in the past.  Denies any medication changes.  States he feels very anxious and worked up ?  ? ? ?Physical Exam  ? ?Triage Vital Signs: ?ED Triage Vitals [08/20/21 1631]  ?Enc Vitals Group  ?   BP 102/75  ?   Pulse Rate 95  ?   Resp 16  ?   Temp 98.2 ?F (36.8 ?C)  ?   Temp Source Oral  ?   SpO2 94 %  ?   Weight (!) 394 lb (178.7 kg)  ?   Height 6\' 5"  (1.956 m)  ?   Head Circumference   ?   Peak Flow   ?   Pain Score 10  ?   Pain Loc   ?   Pain Edu?   ?   Excl. in GC?   ? ? ?Most recent vital signs: ?Vitals:  ? 08/20/21 1631  ?BP: 102/75  ?Pulse: 95  ?Resp: 16  ?Temp: 98.2 ?F (36.8 ?C)  ?SpO2: 94%  ? ? ? ?Constitutional: Alert  ?Eyes: Conjunctivae are normal.  ?Head: Atraumatic. ?Nose: No congestion/rhinnorhea. ?Mouth/Throat: Mucous membranes are moist.   ?Neck: Painless ROM. No ligature marking ?Cardiovascular:   Good peripheral circulation. ?Respiratory: Normal respiratory effort.  No retractions.  ?Gastrointestinal: Soft and nontender.  ?Musculoskeletal:  no deformity ?Neurologic:  MAE spontaneously. No gross focal neurologic deficits are appreciated.  ?Skin:  Skin is warm, dry and intact. No rash noted. ?Psychiatric: Mood and affect are normal. Speech and behavior are normal. ? ? ? ?ED Results / Procedures / Treatments  ? ?Labs ?(all labs ordered are listed, but only abnormal results are displayed) ?Labs Reviewed  ?CBC - Abnormal; Notable for the following  components:  ?    Result Value  ? WBC 11.6 (*)   ? All other components within normal limits  ?COMPREHENSIVE METABOLIC PANEL  ?ETHANOL  ?SALICYLATE LEVEL  ?ACETAMINOPHEN LEVEL  ?URINE DRUG SCREEN, QUALITATIVE ([facility] ONLY)  ? ? ? ?EKG ? ? ? ? ?RADIOLOGY ? ? ? ?PROCEDURES: ? ?Critical Care performed:  ? ?Procedures ? ? ?MEDICATIONS ORDERED IN ED: ?Medications - No data to display ? ? ?IMPRESSION / MDM / ASSESSMENT AND PLAN / ED COURSE  ?I reviewed the triage vital signs and the nursing notes. ?             ?               ? ?Differential diagnosis includes, but is not limited to, Psychosis, delirium, medication effect, noncompliance, polysubstance abuse, Si, Hi, depression ? ?Patient here for evaluation of SI.  Patient has psych history of previous suicide attempts.  Laboratory testing was ordered to evaluation for underlying electrolyte derangement or signs of underlying organic pathology to explain today's presentation.  Based on history and physical and laboratory evaluation, it appears that the patient's presentation is 2/2 underlying psychiatric disorder  and will require further evaluation and management by inpatient psychiatry.  Patient was  made an IVC due to SI with attempted hanging.  Disposition pending psychiatric evaluation. ? ?The patient has been placed in psychiatric observation due to the need to provide a safe environment for the patient while obtaining psychiatric consultation and evaluation, as well as ongoing medical and medication management to treat the patient's condition.  The patient has been placed under full IVC at this time. ? ?  ? ? ?FINAL CLINICAL IMPRESSION(S) / ED DIAGNOSES  ? ?Final diagnoses:  ?Suicidal ideation  ? ? ? ?Rx / DC Orders  ? ?ED Discharge Orders   ? ? None  ? ?  ? ? ? ?Note:  This document was prepared using Dragon voice recognition software and may include unintentional dictation errors. ? ?  ?Willy Eddy, MD ?08/20/21 1653 ? ?

## 2021-08-20 NOTE — BH Assessment (Signed)
Attempted to contact Pt's legal guardian Florencia Reasons, [facility] at [phone]). Phone kept ringing and there was no ability to leave a voicemail.  ?

## 2021-08-20 NOTE — BH Assessment (Signed)
Comprehensive Clinical Assessment (CCA) Note ? ?08/20/2021 ?Daniel [name] ?[id number] ?Recommendations for Services/Supports/Treatments: Pt pending psych consult/disposition. ? ?Daniel [name] is a 31 year old, English speaking, Caucasian male with a PMH of schizoaffective disorder, bipolar type and borderline personality disorder. Upon assessment, was forthcoming and goal directed. When asked what'd brought him to the ED, the pt explained that he was having difficulty with transitioning to his current [facility] placement. Pt reported that while he likes the home, he mourns his lost independence. Pt endorsed feelings of depression and increased anxiety. Pt explained that he has racing and intrusive thoughts. Pt reported that his sleep has increased and his appetite is poor. Pt reported that he has not been consistently taking his medications or completing his hygiene. Pt's thought processes were logical and relevant to the content of the conversation. Eye contact and attention span were normal. Pt's mood was dysphoric; affect was responsive. Pt had adequate reality testing. Pt identified his main stressors as grieving the loss of his father 5 or 6 years ago and his inability to see his 39 year old brother reach his milestones. Pt also reported that he struggles with flashbacks from his dark past. Pt's protective factors are having natural supports, supportive family, and accessible housing. The pt. admitted to using alcohol to medicate when stressed. Pt reported that his last drink was on 08/19/21; admitting that he has about 1-2 drinks. The pt. denied symptoms of paranoia and did not present with delusional thinking. Pt did not appear to be responding to internal/external stimuli. The pt. reported having visual hallucinations of shadows in his peripheral vision. The reported having multiple previous hospitalizations and suicide attempts. Pt endorsed current SI with a plan to jump off of a bridge. Pt denied HI, and A/H.  Pt's UDS/BAL are unremarkable.  ? ?Chief Complaint:  ?Chief Complaint  ?Patient presents with  ? Mental Health Problem  ? ?Visit Diagnosis: Schizoaffective disorder, bipolar type ?SI  ? ? ?CCA Screening, Triage and Referral (STR) ? ?Patient Reported Information ?How did you hear about [place]? Other (Comment) ? ?Referral name: No data recorded ?Referral phone number: No data recorded ? ?Whom do you see for routine medical problems? No data recorded ?Practice/Facility Name: No data recorded ?Practice/Facility Phone Number: No data recorded ?Name of Contact: No data recorded ?Contact Number: No data recorded ?Contact Fax Number: No data recorded ?Prescriber Name: No data recorded ?Prescriber Address (if known): No data recorded ? ?What Is the Reason for Your Visit/Call Today? Arrives with [facility] representative with patient.  Patient states "I have a few things on my mind" and endorses SI x 2-3 weeks. ? ?How Long Has This Been Causing You Problems? > than 6 months ? ?What Do You Feel Would Help You the Most Today? Treatment for Depression or other mood problem; Stress Management ? ? ?Have You Recently Been in Any Inpatient Treatment (Hospital/Detox/Crisis Center/28-Day Program)? No data recorded ?Name/Location of Program/Hospital:No data recorded ?How Long Were You There? No data recorded ?When Were You Discharged? No data recorded ? ?Have You Ever Received Services From [facility] Before? No data recorded ?Who Do You See at [facility]? No data recorded ? ?Have You Recently Had Any Thoughts About Hurting Yourself? Yes ? ?Are You Planning to Commit Suicide/Harm Yourself At This time? Yes ? ? ?Have you Recently Had Thoughts About [place]? No ? ?Explanation: No data recorded ? ?Have You Used Any Alcohol or Drugs in the Past 24 Hours? No ? ?How Long Ago Did You  Use Drugs or Alcohol? No data recorded ?What Did You Use and How Much? No data recorded ? ?Do You Currently Have a  Therapist/Psychiatrist? Yes ? ?Name of Therapist/Psychiatrist: Unknown ? ? ?Have You Been Recently Discharged From Any Office Practice or Programs? No ? ?Explanation of Discharge From Practice/Program: No data recorded ? ?  ?CCA Screening Triage Referral Assessment ?Type of Contact: Face-to-Face ? ?Is this Initial or Reassessment? No data recorded ?Date Telepsych consult ordered in [facility]:  No data recorded ?Time Telepsych consult ordered in [facility]:  No data recorded ? ?Patient Reported Information Reviewed? No data recorded ?Patient Left Without Being Seen? No data recorded ?Reason for Not Completing Assessment: No data recorded ? ?Collateral Involvement: Ennis Forts, Legal Guardian ? ? ?Does Patient Have a Stage manager Guardian? No data recorded ?Name and Contact of Legal Guardian: No data recorded ?If Minor and Not Living with Parent(s), Who has Custody? n/a ? ?Is CPS involved or ever been involved? Never ? ?Is APS involved or ever been involved? Never ? ? ?Patient Determined To Be At Risk for Harm To Self or Others Based on Review of Patient Reported Information or Presenting Complaint? Yes, for Self-Harm ? ?Method: No data recorded ?Availability of Means: No data recorded ?Intent: No data recorded ?Notification Required: No data recorded ?Additional Information for Danger to Others Potential: No data recorded ?Additional Comments for Danger to Others Potential: No data recorded ?Are There Guns or Other Weapons in [place]? No data recorded ?Types of Guns/Weapons: No data recorded ?Are These Weapons Safely Secured?                            No data recorded ?Who Could Verify You Are Able To Have These Secured: No data recorded ?Do You Have any Outstanding Charges, Pending Court Dates, Parole/Probation? No data recorded ?Contacted To Inform of Risk of Harm To Self or Others: Unable to Contact: ? ? ?Location of Assessment: [facility] ED ? ? ?Does Patient Present under Involuntary Commitment? Yes ? ?IVC Papers  Initial File Date: 08/20/21 ? ? ?[place] of Residence: [place] ? ? ?Patient Currently Receiving the Following Services: -- ([facility]) ? ? ?Determination of Need: Emergent (2 hours) ? ? ?Options For Referral: Therapeutic Triage Services; ED Referral ? ? ? ? ?CCA Biopsychosocial ?Intake/Chief Complaint:  No data recorded ?Current Symptoms/Problems: No data recorded ? ?Patient Reported Schizophrenia/Schizoaffective Diagnosis in Past: Yes ? ? ?Strengths: Pt is able to ask for help; pt has stable housing ? ?Preferences: No data recorded ?Abilities: No data recorded ? ?Type of Services Patient Feels are Needed: No data recorded ? ?Initial Clinical Notes/Concerns: No data recorded ? ?Mental Health Symptoms ?Depression:   ?Hopelessness; Change in energy/activity; Sleep (too much or little); Increase/decrease in appetite ?  ?Duration of Depressive symptoms:  ?Greater than two weeks ?  ?Mania:   ?Racing thoughts ?  ?Anxiety:    ?Worrying; Tension ?  ?Psychosis:   ?Hallucinations ?  ?Duration of Psychotic symptoms:  ?Greater than six months ?  ?Trauma:   ?Guilt/shame; Irritability/anger; Re-experience of traumatic event ?  ?Obsessions:   ?Recurrent & persistent thoughts/impulses/images; Cause anxiety; Disrupts routine/functioning; Good insight; Intrusive/time consuming ?  ?Compulsions:   ?N/A ?  ?Inattention:   ?N/A ?  ?Hyperactivity/Impulsivity:   ?N/A ?  ?Oppositional/Defiant Behaviors:   ?Angry; Easily annoyed; Resentful ?  ?Emotional Irregularity:   ?Intense/inappropriate anger; Recurrent suicidal behaviors/gestures/threats; Unstable self-image ?  ?Other Mood/Personality Symptoms:  No  data recorded  ? ?Mental Status Exam ?Appearance and self-care  ?Stature:   ?Average ?  ?Weight:   ?Obese ?  ?Clothing:   ?-- (In hospital gown) ?  ?Grooming:   ?Normal ?  ?Cosmetic use:   ?None ?  ?Posture/gait:   ?Normal ?  ?Motor activity:   ?Not Remarkable ?  ?Sensorium  ?Attention:   ?Normal ?  ?Concentration:   ?Normal ?  ?Orientation:    ?Object; Person; Situation; Place ?  ?Recall/memory:   ?Normal ?  ?Affect and Mood  ?Affect:   ?Anxious ?  ?Mood:   ?Depressed ?  ?Relating  ?Eye contact:   ?Normal ?  ?Facial expression:   ?Responsive ?  ?Attitu

## 2021-08-20 NOTE — ED Notes (Signed)
TTS speaking with patient. 

## 2021-08-20 NOTE — ED Notes (Addendum)
Daniel [name] from [facility] group home accompanied patient to ER.   ?He can be contacted if patient is discharged.  ? ?[phone] ? ? ?

## 2021-08-20 NOTE — ED Notes (Signed)
IVC PENDING  CONSULT ?

## 2021-08-20 NOTE — ED Notes (Signed)
Psych provider speaking with patient. °

## 2021-08-20 NOTE — ED Notes (Signed)
Pt is very talkative with staff in the ER.  Pt does not appear to be depressed or anxious.  ?

## 2021-08-20 NOTE — ED Notes (Signed)
Pt dressed into gown and burgundy pants (pt unable to fit into 3x shirt) ? ?Belongings include: ?Button shirt ?Jeans ?Boxers ?Socks ?Shoes ?Wallet in pocket of jeans ?

## 2021-08-21 ENCOUNTER — Emergency Department
Admission: EM | Admit: 2021-08-21 | Discharge: 2021-08-21 | Disposition: A | Discharge status: Home / Self Care | Payer: Medicaid Other | Source: Home / Self Care | Attending: Emergency Medicine | Admitting: Emergency Medicine

## 2021-08-21 DIAGNOSIS — R45851 Suicidal ideations: Secondary | ICD-10-CM

## 2021-08-21 DIAGNOSIS — I4891 Unspecified atrial fibrillation: Secondary | ICD-10-CM | POA: Insufficient documentation

## 2021-08-21 DIAGNOSIS — E119 Type 2 diabetes mellitus without complications: Secondary | ICD-10-CM | POA: Insufficient documentation

## 2021-08-21 DIAGNOSIS — F250 Schizoaffective disorder, bipolar type: Secondary | ICD-10-CM

## 2021-08-21 DIAGNOSIS — Z7901 Long term (current) use of anticoagulants: Secondary | ICD-10-CM | POA: Insufficient documentation

## 2021-08-21 DIAGNOSIS — I482 Chronic atrial fibrillation, unspecified: Secondary | ICD-10-CM

## 2021-08-21 LAB — VALPROIC ACID LEVEL: Valproic Acid Lvl: 20 ug/mL — ABNORMAL LOW (ref 50.0–100.0)

## 2021-08-21 MED ORDER — DIVALPROEX SODIUM 500 MG PO DR TAB
500.0000 mg | DELAYED_RELEASE_TABLET | Freq: Once | ORAL | Status: AC
  Administered 2021-08-21: 500 mg via ORAL
  Filled 2021-08-21: qty 1

## 2021-08-21 MED ORDER — DILTIAZEM HCL ER COATED BEADS 180 MG PO CP24
360.0000 mg | ORAL_CAPSULE | Freq: Once | ORAL | Status: AC
Start: 2021-08-21 — End: 2021-08-21
  Administered 2021-08-21: 360 mg via ORAL
  Filled 2021-08-21: qty 2

## 2021-08-21 MED ORDER — APIXABAN 5 MG PO TABS
5.0000 mg | ORAL_TABLET | Freq: Two times a day (BID) | ORAL | Status: DC
Start: 2021-08-21 — End: 2021-08-21

## 2021-08-21 MED ORDER — DILTIAZEM HCL 25 MG/5ML IV SOLN
10.0000 mg | Freq: Once | INTRAVENOUS | Status: AC
  Administered 2021-08-21: 10 mg via INTRAVENOUS
  Filled 2021-08-21: qty 5

## 2021-08-21 MED ORDER — APIXABAN 5 MG PO TABS: 5.0000 mg | ORAL_TABLET | Freq: Once | ORAL | Status: DC

## 2021-08-21 MED ORDER — METOPROLOL TARTRATE 50 MG PO TABS
100.0000 mg | ORAL_TABLET | Freq: Once | ORAL | Status: AC
  Administered 2021-08-21: 100 mg via ORAL
  Filled 2021-08-21: qty 2

## 2021-08-21 NOTE — BH Assessment (Signed)
Referral information for Psychiatric Hospitalization faxed to: ? ?[facility] [phone]- [phone]) ? ?[facility] [phone]),  ? ?[facility] [phone]) ? ?[facility] [phone]), ? ?[place] [phone]),  ? ?[facility] [phone] -[phone]),  ? ?[facility] [phone]). ? ?[facility] [phone]) ? ?

## 2021-08-21 NOTE — Consult Note (Signed)
[facility] Face-to-Face Psychiatry Consult  ? ?Reason for Consult:  Psych Evaluation ?Referring Physician:  Dr. Roxan Hockey ?Patient Identification: Daniel [name] ?MRN:  [id number] ?Principal Diagnosis: Schizoaffective disorder, bipolar type (HCC) ?Diagnosis:  Principal Problem: ?  Schizoaffective disorder, bipolar type (HCC) ? ? ?Total Time spent with patient: 45 minutes ? ?Subjective:  "I like my group home.  I just wish there was more to do." ? ?31 yo male who presents to the ED with suicidal ideations.  Today, he is calm and cooperative with no suicidal/homicidal ideations, hallucinations, or substance abuse.  He is calm and cooperative, no distress.  He reports liking his group home and wishes there were more options of things to do including volunteering. ? ?His guardian, Florencia Reasons, who communicates that he has a "pattern of attention seeking when he doesn't get his way" and comes to the hospital.  Long history of going to EDs multiple times a day in larger areas he requests to go to different hospitals.  He presented with conversion disorder which she states is not the case.  She wants him to return to his AFL where he is the only client with a sole caregiver.   ? ?HPI:  Daniel [name], 31 y.o., male patient seen by this provider; chart reviewed and consulted with Dr. Roxan Hockey on 08/21/21. On approach, patient is laying in hospital bed attached to a CPAP machine. He does make much eye contact with this provider.  On evaluation Daniel [name] reports that he is living at new facility and says he's having difficulty adjusting.  He doesn't talk much to this provider, however, he is still endorsing suicidal ideations.   ? ?Per TTS, Patient has a hx of PMH of schizoaffective disorder, bipolar type and borderline personality disorder. Upon assessment, was forthcoming and goal directed. When asked what'd brought him to the ED, the pt explained that he was having difficulty with transitioning to his current AFL  placement. Pt reported that while he likes the home, he mourns his lost independence. Pt endorsed feelings of depression and increased anxiety. Pt explained that he has racing and intrusive thoughts. Pt reported that his sleep has increased and his appetite is poor. Pt reported that he has not been consistently taking his medications or completing his hygiene.  Pt identified his main stressors as grieving the loss of his father 5 or 6 years ago and his inability to see his 17 year old brother reach his milestones. Pt also reported that he struggles with flashbacks from his dark past. Pt's protective factors are having natural supports, supportive family, and accessible housing. The pt. admitted to using alcohol to medicate when stressed. Pt reported that his last drink was on 08/19/21; admitting that he has about 1-2 drinks. The pt. denied symptoms of paranoia and did not present with delusional thinking. Pt did not appear to be responding to internal/external stimuli. The pt. reported having visual hallucinations of shadows in his peripheral vision. The reported having multiple previous hospitalizations and suicide attempts. Pt endorsed current SI with a plan to jump off of a bridge. Pt denied HI, and A/H. Pt's UDS/BAL are unremarkable.     ? ?Upon chart review, patient is shown to have multiple ER presentation in the earlier part of last year. Most visits are medically related.  However, there was some concern for malingering. Patient has not had a psychiatric presentation in an approximate year and this is his first time presenting to [facility]. Patient's current risk factors include prior SA, recent losses, age  and race.  This provider thinks the patient will benefit from an inpatient hospitalization.   ? ?Recommendation:  Inpatient hospitalization. ? ?Past Psychiatric History: schizoaffective disorder ? ?Risk to Self:  none ?Risk to Others:  none ?Prior Inpatient Therapy:  multiple ?Prior Outpatient Therapy:  in  place ? ?Past Medical History:  ?Past Medical History:  ?Diagnosis Date  ? Atrial fibrillation (HCC)   ? Current use of long term anticoagulation   ? Diabetes mellitus type 2, uncomplicated (HCC)   ? Hyperlipidemia   ? Hypertension   ? OSA (obstructive sleep apnea)   ? TIA (transient ischemic attack)   ? History reviewed. No pertinent surgical history. ?Family History: History reviewed. No pertinent family history. ?Family Psychiatric  History: unknown ?Social History:  ?Social History  ? ?Substance and Sexual Activity  ?Alcohol Use Not Currently  ?   ?Social History  ? ?Substance and Sexual Activity  ?Drug Use Never  ?  ?Social History  ? ?Socioeconomic History  ? Marital status: Single  ?  Spouse name: Not on file  ? Number of children: Not on file  ? Years of education: Not on file  ? Highest education level: Not on file  ?Occupational History  ? Not on file  ?Tobacco Use  ? Smoking status: Former  ?  Types: Cigarettes  ? Smokeless tobacco: Never  ?Substance and Sexual Activity  ? Alcohol use: Not Currently  ? Drug use: Never  ? Sexual activity: Not on file  ?Other Topics Concern  ? Not on file  ?Social History Narrative  ? Not on file  ? ?Social Determinants of Health  ? ?Financial Resource Strain: Not on file  ?Food Insecurity: Not on file  ?Transportation Needs: Not on file  ?Physical Activity: Not on file  ?Stress: Not on file  ?Social Connections: Not on file  ? ?Additional Social History: ?  ? ?Allergies:   ?Allergies  ?Allergen Reactions  ? Haldol [Haloperidol] Other (See Comments)  ?  SI  ? Abilify [Aripiprazole] Palpitations  ? Demerol [Meperidine Hcl] Hives  ? ? ?Labs:  ?Results for orders placed or performed during the hospital encounter of 08/20/21 (from the past 48 hour(s))  ?Comprehensive metabolic panel     Status: Abnormal  ? Collection Time: 08/20/21  4:35 PM  ?Result Value Ref Range  ? Sodium 140 135 - 145 mmol/L  ? Potassium 4.9 3.5 - 5.1 mmol/L  ? Chloride 98 98 - 111 mmol/L  ? CO2 30 22 - 32  mmol/L  ? Glucose, Bld 115 (H) 70 - 99 mg/dL  ?  Comment: Glucose reference range applies only to samples taken after fasting for at least 8 hours.  ? BUN 26 (H) 6 - 20 mg/dL  ? Creatinine, Ser 1.11 0.61 - 1.24 mg/dL  ? Calcium 9.6 8.9 - 10.3 mg/dL  ? Total Protein 8.1 6.5 - 8.1 g/dL  ? Albumin 4.1 3.5 - 5.0 g/dL  ? AST 29 15 - 41 U/L  ? ALT 23 0 - 44 U/L  ? Alkaline Phosphatase 58 38 - 126 U/L  ? Total Bilirubin 0.8 0.3 - 1.2 mg/dL  ? GFR, Estimated >60 >60 mL/min  ?  Comment: (NOTE) ?Calculated using the CKD-EPI Creatinine Equation (2021) ?  ? Anion gap 12 5 - 15  ?  Comment: Performed at [facility], [address] [place], [place] [address]  ?Ethanol     Status: None  ? Collection Time: 08/20/21  4:35 PM  ?Result Value  Ref Range  ? Alcohol, Ethyl (B) <10 <10 mg/dL  ?  Comment: (NOTE) ?Lowest detectable limit for serum alcohol is 10 mg/dL. ? ?For medical purposes only. ?Performed at [facility], [address] [place], ?[place] [address] ?  ?Salicylate level     Status: Abnormal  ? Collection Time: 08/20/21  4:35 PM  ?Result Value Ref Range  ? Salicylate Lvl <7.0 (L) 7.0 - 30.0 mg/dL  ?  Comment: Performed at [facility], [address], [place], [place] [address]  ?Acetaminophen level     Status: Abnormal  ? Collection Time: 08/20/21  4:35 PM  ?Result Value Ref Range  ? Acetaminophen (Tylenol), Serum <10 (L) 10 - 30 ug/mL  ?  Comment: (NOTE) ?Therapeutic concentrations vary significantly. A range of 10-30 ug/mL  ?may be an effective concentration for many patients. However, some  ?are best treated at concentrations outside of this range. ?Acetaminophen concentrations >150 ug/mL at 4 hours after ingestion  ?and >50 ug/mL at 12 hours after ingestion are often associated with  ?toxic reactions. ? ?Performed at [facility], [address] [place], ?[place] [address] ?  ?cbc     Status: Abnormal  ? Collection Time: 08/20/21  4:35 PM  ?Result Value Ref Range  ? WBC 11.6  (H) 4.0 - 10.5 K/uL  ? RBC 5.24 4.22 - 5.81 MIL/uL  ? Hemoglobin 14.9 13.0 - 17.0 g/dL  ? HCT 45.5 39.0 - 52.0 %  ? MCV 86.8 80.0 - 100.0 fL  ? MCH 28.4 26.0 - 34.0 pg  ? MCHC 32.7 30.0 - 36.0 g/dL  ? RDW 13

## 2021-08-21 NOTE — ED Notes (Signed)
Spoke to group home director Daniel [name] @ [phone].  Informed [name] that I would c/b when pt ready to dc ?

## 2021-08-21 NOTE — Consult Note (Signed)
[facility] Face-to-Face Psychiatry Consult  ? ?Reason for Consult:  Psych Evaluation ?Referring Physician:  Dr. Roxan Hockey ?Patient Identification: Daniel [name] ?MRN:  [id number] ?Principal Diagnosis: Afib ?Diagnosis:  Schizoaffective disorder ? ?Total Time spent with patient: 45 minutes ? ?Subjective:  "I need an admission", despite having services in place and not meeting criteria for admission. ? ?Client returned to the ED like his guardian predicted, which started with chest pain and then suicidal ideations.  Medically stabilized.  Reassessed the client who has chronic suicidal ideations with desires to be hospitalized per his guardian and himself despite living at an AFL with no other clients with a 1:1 caregiver.  No suicide intent or plan, no psychosis or substance abuse.  Encouraged him to continue with his outpatient practice and services.  Psychiatrically stable. ? ?On 3/25 earlier today:  31 yo male who presents to the ED with suicidal ideations, history of schizoaffective disorder and IDD.  Today, he is calm and cooperative with no suicidal/homicidal ideations, hallucinations, or substance abuse.  He is calm and cooperative, no distress.  He reports liking his group home and wishes there were more options of things to do including volunteering. ? ?His guardian, Florencia Reasons, who communicates that he has a "pattern of attention seeking when he doesn't get his way" and comes to the hospital.  Long history of going to EDs multiple times a day in larger areas he requests to go to different hospitals.  He presented with conversion disorder which she states is not the case.  She wants him to return to his AFL where he is the only client with a sole caregiver.   ? ?HPI:  Daniel [name], 31 y.o., male patient seen by this provider; chart reviewed and consulted with Dr. Roxan Hockey on 08/21/21. On approach, patient is laying in hospital bed attached to a CPAP machine. He does make much eye contact with this  provider.  On evaluation Daniel [name] reports that he is living at new facility and says he's having difficulty adjusting.  He doesn't talk much to this provider, however, he is still endorsing suicidal ideations.   ? ?Per TTS, Patient has a hx of PMH of schizoaffective disorder, bipolar type and borderline personality disorder. Upon assessment, was forthcoming and goal directed. When asked what'd brought him to the ED, the pt explained that he was having difficulty with transitioning to his current AFL placement. Pt reported that while he likes the home, he mourns his lost independence. Pt endorsed feelings of depression and increased anxiety. Pt explained that he has racing and intrusive thoughts. Pt reported that his sleep has increased and his appetite is poor. Pt reported that he has not been consistently taking his medications or completing his hygiene.  Pt identified his main stressors as grieving the loss of his father 5 or 6 years ago and his inability to see his 70 year old brother reach his milestones. Pt also reported that he struggles with flashbacks from his dark past. Pt's protective factors are having natural supports, supportive family, and accessible housing. The pt. admitted to using alcohol to medicate when stressed. Pt reported that his last drink was on 08/19/21; admitting that he has about 1-2 drinks. The pt. denied symptoms of paranoia and did not present with delusional thinking. Pt did not appear to be responding to internal/external stimuli. The pt. reported having visual hallucinations of shadows in his peripheral vision. The reported having multiple previous hospitalizations and suicide attempts. Pt endorsed current SI with a plan to jump  off of a bridge. Pt denied HI, and A/H. Pt's UDS/BAL are unremarkable.     ? ?Upon chart review, patient is shown to have multiple ER presentation in the earlier part of last year. Most visits are medically related.  However, there was some concern  for malingering. Patient has not had a psychiatric presentation in an approximate year and this is his first time presenting to [facility]. Patient's current risk factors include prior SA, recent losses, age and race.  This provider thinks the patient will benefit from an inpatient hospitalization.   ? ?Recommendation:  Inpatient hospitalization. ? ?Past Psychiatric History: schizoaffective disorder ? ?Risk to Self:  none ?Risk to Others:  none ?Prior Inpatient Therapy:  multiple ?Prior Outpatient Therapy:  in place ? ?Past Medical History:  ?Past Medical History:  ?Diagnosis Date  ? Atrial fibrillation (HCC)   ? Current use of long term anticoagulation   ? Diabetes mellitus type 2, uncomplicated (HCC)   ? Hyperlipidemia   ? Hypertension   ? OSA (obstructive sleep apnea)   ? TIA (transient ischemic attack)   ? No past surgical history on file. ?Family History: No family history on file. ?Family Psychiatric  History: unknown ?Social History:  ?Social History  ? ?Substance and Sexual Activity  ?Alcohol Use Not Currently  ?   ?Social History  ? ?Substance and Sexual Activity  ?Drug Use Never  ?  ?Social History  ? ?Socioeconomic History  ? Marital status: Single  ?  Spouse name: Not on file  ? Number of children: Not on file  ? Years of education: Not on file  ? Highest education level: Not on file  ?Occupational History  ? Not on file  ?Tobacco Use  ? Smoking status: Former  ?  Types: Cigarettes  ? Smokeless tobacco: Never  ?Substance and Sexual Activity  ? Alcohol use: Not Currently  ? Drug use: Never  ? Sexual activity: Not on file  ?Other Topics Concern  ? Not on file  ?Social History Narrative  ? Not on file  ? ?Social Determinants of Health  ? ?Financial Resource Strain: Not on file  ?Food Insecurity: Not on file  ?Transportation Needs: Not on file  ?Physical Activity: Not on file  ?Stress: Not on file  ?Social Connections: Not on file  ? ?Additional Social History: ?  ? ?Allergies:   ?Allergies  ?Allergen Reactions  ?  Haldol [Haloperidol] Other (See Comments)  ?  SI  ? Abilify [Aripiprazole] Palpitations  ? Demerol [Meperidine Hcl] Hives  ? ? ?Labs:  ?Results for orders placed or performed during the hospital encounter of 08/21/21 (from the past 48 hour(s))  ?Valproic acid level     Status: Abnormal  ? Collection Time: 08/21/21  2:43 PM  ?Result Value Ref Range  ? Valproic Acid Lvl 20 (L) 50.0 - 100.0 ug/mL  ?  Comment: Performed at [facility], [address] [place], [place] [address]  ? ? ?Current Facility-Administered Medications  ?Medication Dose Route Frequency Provider Last Rate Last Admin  ? divalproex (DEPAKOTE) DR tablet 500 mg  500 mg Oral Once Kem Boroughsriplett, Cari B, FNP      ? ?Current Outpatient Medications  ?Medication Sig Dispense Refill  ? albuterol (VENTOLIN HFA) 108 (90 Base) MCG/ACT inhaler Inhale 2 puffs into the lungs every 6 (six) hours as needed for wheezing.    ? allopurinol (ZYLOPRIM) 100 MG tablet Take 100 mg by mouth daily.    ? atorvastatin (LIPITOR) 80 MG tablet Take  80 mg by mouth daily.    ? baclofen (LIORESAL) 10 MG tablet Take 1 tablet (10 mg total) by mouth 2 (two) times daily as needed for muscle spasms. Home med. 30 each 0  ? cyclobenzaprine (FLEXERIL) 5 MG tablet Take 1 tablet (5 mg total) by mouth 3 (three) times daily as needed for muscle spasms. Home med. 30 tablet 0  ? diltiazem (CARDIZEM CD) 360 MG 24 hr capsule Take 1 capsule (360 mg total) by mouth daily. 30 capsule 2  ? divalproex (DEPAKOTE) 500 MG DR tablet Take 1,000-1,500 mg by mouth 2 (two) times daily. 1000 mg every morning and 1500 mg at bedtime    ? ELIQUIS 5 MG TABS tablet Take 5 mg by mouth 2 (two) times daily.    ? escitalopram (LEXAPRO) 10 MG tablet Take 10 mg by mouth daily.    ? fluticasone (FLONASE) 50 MCG/ACT nasal spray Place 2 sprays into both nostrils 2 (two) times daily.    ? INVEGA 9 MG 24 hr tablet Take 9 mg by mouth every morning.    ? loratadine (CLARITIN) 10 MG tablet Take 10 mg by mouth daily.    ?  melatonin 3 MG TABS tablet Take 3 mg by mouth at bedtime.    ? metoprolol tartrate (LOPRESSOR) 100 MG tablet Take 1 tablet (100 mg total) by mouth 2 (two) times daily. 60 tablet 2  ? naproxen (NAPROSYN) 500 MG tab

## 2021-08-21 NOTE — ED Notes (Signed)
Pt given sandwich tray and water 

## 2021-08-21 NOTE — Discharge Instructions (Signed)

## 2021-08-21 NOTE — ED Provider Notes (Signed)
Patient has been seen evaluated by psychiatry, Nanine Means.  IVC has been rescinded.  Psychiatry also spoke with the patient's guardian and advised patient can be returned to his assisted living facility. ? ? ? ? ?  ?Sharyn Creamer, MD ?08/21/21 1052 ? ?

## 2021-08-21 NOTE — ED Triage Notes (Signed)
Pt to ED [facility] from [facility] for multiple complaints. Reports he wiped his butt this am and there was blood. Also reports he feels like he is in a fib and takes meds.  ?Group home worker states tried to jump out of car on way to [facility]. Pt states he did but he didn't at the same time.  ?States upper abd pain ?Pt denies si/hi ?

## 2021-08-21 NOTE — ED Notes (Signed)
Report received from Dawn T , RN including SBAR. On initial round after report Pt is warm/dry, resting quietly in room without any s/s of distress.  Will continue to monitor throughout shift as ordered for any changes in behaviors and for continued safety.   

## 2021-08-21 NOTE — ED Notes (Signed)
Spoke to Daniel [name] at group home stated they would be here 'shortly' to p/u pt ?

## 2021-08-21 NOTE — Discharge Instructions (Signed)
Please call and schedule an appointment with cardiology. ? ?Take all medications as prescribed. ?

## 2021-08-21 NOTE — ED Notes (Signed)
Pt NAD in bed, a/ox4. Pt states he was just seen here and DC an hour or 2 ago and after DC had a large BM and felt himself go into afib. Pt c/o subjective 10/10 sharp CP along with palpitations. Pt HR 110-120. 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  ?

## 2021-08-21 NOTE — ED Provider Notes (Signed)
? ?[facility] ?Provider Note ? ? ? Event Date/Time  ? First MD Initiated Contact with Patient 08/21/21 1424   ?  (approximate) ? ? ?History  ? ?Rectal Bleeding and Atrial Fibrillation ? ? ?HPI ? ?Daniel [name] is a 31 y.o. male with a history of A-fib on Eliquis, diabetes type 2, schizoaffective disorder, obesity and as listed in EMR presents to the emergency department for evaluation of multiple complaints.  States that this morning after having a bowel movement he felt himself go into A-fib.  When he wiped, he thinks there was blood on the tissue but also states that he is "color blind with the color red."  He denies abdominal pain or diarrhea.  Currently denies any chest pain but does have palpitations.  No shortness of breath.  He was here yesterday for a psych related complaint and did not receive his daily medications which include metoprolol and Cardizem.  ? ?  ? ? ?Physical Exam  ? ?Triage Vital Signs: ?ED Triage Vitals  ?Enc Vitals Group  ?   BP 08/21/21 1414 124/66  ?   Pulse Rate 08/21/21 1414 64  ?   Resp 08/21/21 1414 18  ?   Temp 08/21/21 1414 98 ?F (36.7 ?C)  ?   Temp src --   ?   SpO2 08/21/21 1414 95 %  ?   Weight 08/21/21 1415 (!) 394 lb 10 oz (179 kg)  ?   Height 08/21/21 1415 6\' 5"  (1.956 m)  ?   Head Circumference --   ?   Peak Flow --   ?   Pain Score 08/21/21 1414 10  ?   Pain Loc --   ?   Pain Edu? --   ?   Excl. in GC? --   ? ? ?Most recent vital signs: ?Vitals:  ? 08/21/21 1630 08/21/21 1827  ?BP: 95/65 127/85  ?Pulse: 95 81  ?Resp: 16 16  ?Temp:  98 ?F (36.7 ?C)  ?SpO2: 96% 99%  ? ? ?General: Awake, no distress.  ?CV:  Good peripheral perfusion.  ?Resp:  Normal effort.  ?Abd:  No distention.  ?Other:   ? ? ?ED Results / Procedures / Treatments  ? ?Labs ?(all labs ordered are listed, but only abnormal results are displayed) ?Labs Reviewed  ?VALPROIC ACID LEVEL - Abnormal; Notable for the following components:  ?    Result Value  ? Valproic Acid Lvl 20 (*)   ? All  other components within normal limits  ?CBC WITH DIFFERENTIAL/PLATELET  ?BASIC METABOLIC PANEL  ?TROPONIN I (HIGH SENSITIVITY)  ?TROPONIN I (HIGH SENSITIVITY)  ? ? ? ?EKG ? ?ED ECG REPORT ?I, 08/23/21, FNP-BC personally viewed and interpreted this ECG. ? ? Date: 08/21/2021 ? EKG Time: 1416 ? Rate: 124 ? Rhythm: atrial fibrillation, rate not controlled ? Axis: normal ? Intervals: ? ST&T Change: none ? ? ? ?RADIOLOGY ? ?Image and radiology report reviewed by me. ? ? ? ?PROCEDURES: ? ?Critical Care performed: No ? ?Procedures ? ? ?MEDICATIONS ORDERED IN ED: ?Medications  ?diltiazem (CARDIZEM) injection 10 mg (10 mg Intravenous Given 08/21/21 1503)  ?diltiazem (CARDIZEM CD) 24 hr capsule 360 mg (360 mg Oral Given 08/21/21 1541)  ?metoprolol tartrate (LOPRESSOR) tablet 100 mg (100 mg Oral Given 08/21/21 1502)  ?divalproex (DEPAKOTE) DR tablet 500 mg (500 mg Oral Given 08/21/21 1711)  ? ? ? ?IMPRESSION / MDM / ASSESSMENT AND PLAN / ED COURSE  ? ?I have reviewed the triage note. ? ?  Differential diagnosis includes, but is not limited to, a-fib with RVR, GI bleed ? ?31 year old male presenting to the emergency department for treatment and evaluation of rapid heart rate and possible GI bleed.  See HPI for further details. ? ?Hemoccult negative and no evidence of lower GI bleeding.  Abdomen is soft and nontender.  Plan will be to give him some IV Cardizem and then his daily medications since he has not had them today.  Will monitor closely in hopes that he becomes rate controlled and then may be discharged home.  In regard to caregivers report that he attempted to jump out of a moving car while on the way to [facility], although he was cleared by psych this morning, will have them reevaluate. ? ?Patient evaluated by psych and again cleared. Depakote level low. Daily dose ordered. ? ?Rate controlled after IV Cardizem and p.o. home dose.  Will discharge home.  Does not look like he has a cardiologist in the area.  [facility] cardiology  information provided and caregiver is to call and schedule follow-up appointment. ? ?  ? ? ?FINAL CLINICAL IMPRESSION(S) / ED DIAGNOSES  ? ?Final diagnoses:  ?Chronic atrial fibrillation (HCC)  ?Schizoaffective disorder, bipolar type (HCC)  ? ? ? ?Rx / DC Orders  ? ?ED Discharge Orders   ? ? None  ? ?  ? ? ? ?Note:  This document was prepared using Dragon voice recognition software and may include unintentional dictation errors. ?  ?Chinita Pester, FNP ?08/21/21 1957 ? ?  ?Concha Se, MD ?08/22/21 [phone] ? ?

## 2021-08-21 NOTE — ED Notes (Signed)
Pt given breakfast tray at this time. 

## 2021-08-21 NOTE — ED Provider Notes (Incomplete)
? ?[facility] ?Emergency Department Provider Note ?____________________________________________ ? ? Event Date/Time  ? First MD Initiated Contact with Patient 08/21/21 1424   ?  (approximate) ? ?I have reviewed the triage vital signs and the nursing notes. ? ? ?HISTORY ? ?Chief Complaint ?Rectal Bleeding and Atrial Fibrillation ? ?HPI ?Daniel [name] is a 31 y.o. male with history of *** presents to the emergency department for treatment and evaluation ***. ? ?   ? ?  ? ?Past Medical History:  ?Diagnosis Date  ? Atrial fibrillation (HCC)   ? Current use of long term anticoagulation   ? Diabetes mellitus type 2, uncomplicated (HCC)   ? Hyperlipidemia   ? Hypertension   ? OSA (obstructive sleep apnea)   ? TIA (transient ischemic attack)   ? ? ?Patient Active Problem List  ? Diagnosis Date Noted  ? Rapid atrial fibrillation (HCC) 08/14/2021  ? Schizoaffective disorder, bipolar type (HCC) 08/14/2021  ? Chronic hypoxemic respiratory failure (HCC) 08/14/2021  ? OSA (obstructive sleep apnea)   ? Diabetes mellitus type 2, uncomplicated (HCC)   ? Gout flare   ? Obesity, Class III, BMI 40-49.9 (morbid obesity) (HCC) 08/02/2021  ? Atrial fibrillation, chronic (HCC) 08/02/2021  ? Current use of long term anticoagulation 08/02/2021  ? ? ?No past surgical history on file. ? ?Prior to Admission medications   ?Medication Sig Start Date End Date Taking? Authorizing Provider  ?albuterol (VENTOLIN HFA) 108 (90 Base) MCG/ACT inhaler Inhale 2 puffs into the lungs every 6 (six) hours as needed for wheezing. 07/12/21   [provider]  ?allopurinol (ZYLOPRIM) 100 MG tablet Take 100 mg by mouth daily. 06/01/21   [provider]  ?atorvastatin (LIPITOR) 80 MG tablet Take 80 mg by mouth daily. 05/11/21   [provider]  ?baclofen (LIORESAL) 10 MG tablet Take 1 tablet (10 mg total) by mouth 2 (two) times daily as needed for muscle spasms. Home med. 08/16/21   Darlin Priestly, MD  ?cyclobenzaprine  (FLEXERIL) 5 MG tablet Take 1 tablet (5 mg total) by mouth 3 (three) times daily as needed for muscle spasms. Home med. 08/16/21   Darlin Priestly, MD  ?diltiazem (CARDIZEM CD) 360 MG 24 hr capsule Take 1 capsule (360 mg total) by mouth daily. 08/16/21 11/14/21  Darlin Priestly, MD  ?divalproex (DEPAKOTE) 500 MG DR tablet Take 1,000-1,500 mg by mouth 2 (two) times daily. 1000 mg every morning and 1500 mg at bedtime 07/21/21   [provider]  ?ELIQUIS 5 MG TABS tablet Take 5 mg by mouth 2 (two) times daily. 06/16/21   [provider]  ?escitalopram (LEXAPRO) 10 MG tablet Take 10 mg by mouth daily. 06/16/21   [provider]  ?fluticasone (FLONASE) 50 MCG/ACT nasal spray Place 2 sprays into both nostrils 2 (two) times daily. 06/16/21   [provider]  ?INVEGA 9 MG 24 hr tablet Take 9 mg by mouth every morning. 07/21/21   [provider]  ?loratadine (CLARITIN) 10 MG tablet Take 10 mg by mouth daily. 06/16/21   [provider]  ?melatonin 3 MG TABS tablet Take 3 mg by mouth at bedtime. 06/08/21   [provider]  ?metoprolol tartrate (LOPRESSOR) 100 MG tablet Take 1 tablet (100 mg total) by mouth 2 (two) times daily. 08/16/21 11/14/21  Darlin Priestly, MD  ?naproxen (NAPROSYN) 500 MG tablet Take 1 tablet (500 mg total) by mouth 2 (two) times daily with a meal for 5 days. 08/16/21 08/21/21  Darlin Priestly, MD  ?  omeprazole (PRILOSEC) 20 MG capsule Take 20 mg by mouth daily.    [provider]  ?predniSONE (DELTASONE) 10 MG tablet Take 40 mg (4 tablets) from 3/21 to 3/22, then 20 mg (2 tablets) from 3/23 to 3/26, then 10 mg (1 tablet) from 3/27 to 3/30, then done.  For gout. 08/16/21   Darlin Priestly, MD  ?promethazine (PHENERGAN) 25 MG tablet Take 25 mg by mouth every 6 (six) hours as needed for nausea or vomiting.    [provider]  ? ? ?Allergies ?Haldol [haloperidol], Abilify [aripiprazole], and Demerol [meperidine hcl] ? ?No family history on file. ? ?Social History ?Social  History  ? ?Tobacco Use  ? Smoking status: Former  ?  Types: Cigarettes  ? Smokeless tobacco: Never  ?Substance Use Topics  ? Alcohol use: Not Currently  ? Drug use: Never  ? ? ?Review of Systems ? ?Constitutional: No fever/chills ?Eyes: No visual changes. ?ENT: No sore throat. ?Cardiovascular: Denies chest pain. ?Respiratory: Denies shortness of breath. ?Gastrointestinal: No abdominal pain.  No nausea, no vomiting.  No diarrhea.  No constipation. ?Genitourinary: Negative for dysuria. ?Musculoskeletal: Negative for back pain. ?Skin: Negative for rash. ?Neurological: Negative for headaches, focal weakness or numbness. ?{**Psychiatric:  ?Endocrine:  ?Hematological/Lymphatic:  ?Allergic/Immunilogical: **} ? ?____________________________________________ ? ? ?PHYSICAL EXAM: ? ?VITAL SIGNS: ?ED Triage Vitals  ?Enc Vitals Group  ?   BP 08/21/21 1414 124/66  ?   Pulse Rate 08/21/21 1414 64  ?   Resp 08/21/21 1414 18  ?   Temp 08/21/21 1414 98 ?F (36.7 ?C)  ?   Temp src --   ?   SpO2 08/21/21 1414 95 %  ?   Weight 08/21/21 1415 (!) 394 lb 10 oz (179 kg)  ?   Height 08/21/21 1415 6\' 5"  (1.956 m)  ?   Head Circumference --   ?   Peak Flow --   ?   Pain Score 08/21/21 1414 10  ?   Pain Loc --   ?   Pain Edu? --   ?   Excl. in GC? --   ? ? ?Constitutional: Alert and oriented. Well appearing and in no acute distress. ?Eyes: Conjunctivae are normal. PERRL. EOMI. ?Head: Atraumatic. ?Nose: No congestion/rhinnorhea. ?Mouth/Throat: Mucous membranes are moist.  Oropharynx non-erythematous. ?Neck: No stridor.   ?Hematological/Lymphatic/Immunilogical: No cervical lymphadenopathy. ?Cardiovascular: Normal rate, regular rhythm. Grossly normal heart sounds.  Good peripheral circulation. ?Respiratory: Normal respiratory effort.  No retractions. Lungs CTAB. ?Gastrointestinal: Soft and nontender. No distention. No abdominal bruits. No CVA tenderness. ?Genitourinary:  ?Musculoskeletal: No lower extremity tenderness nor edema.  No joint  effusions. ?Neurologic:  Normal speech and language. No gross focal neurologic deficits are appreciated. No gait instability. ?Skin:  Skin is warm, dry and intact. No rash noted. ?Psychiatric: Mood and affect are normal. Speech and behavior are normal. ? ?____________________________________________ ?  ?LABS ?(all labs ordered are listed, but only abnormal results are displayed) ? ?Labs Reviewed - No data to display ?____________________________________________ ? ?EKG ? ?*** ?____________________________________________ ? ?RADIOLOGY ? ?ED MD interpretation:   ? ?*** ?I, 08/23/21, personally viewed and evaluated these images (plain radiographs) as part of my medical decision making, as well as reviewing the written report by the radiologist. ? ?Official radiology report(s): ?No results found. ? ?____________________________________________ ? ? ?PROCEDURES ? ?Procedure(s) performed (including Critical Care): ? ?Procedures ? ?____________________________________________ ? ? ?INITIAL IMPRESSION / ASSESSMENT AND PLAN  ? ?  ?*** ? ?DIFFERENTIAL DIAGNOSIS ? ?*** ? ?ED COURSE ? ?*** ? ?  ? ?  As part of my medical decision making, I reviewed the following data within the electronic MEDICAL RECORD NUMBER {[id number]::"Notes from prior ED visits","[place] Controlled Substance Database"} ? ?___________________________________________ ? ? ?FINAL CLINICAL IMPRESSION(S) / ED DIAGNOSES ? ?Final diagnoses:  ?None  ? ? ? ?ED Discharge Orders   ? ? None  ? ?  ? ? ? ?Daniel [name] was evaluated in Emergency Department on 08/21/2021 for the symptoms described in the history of present illness. He was evaluated in the context of the global COVID-19 pandemic, which necessitated consideration that the patient might be at risk for infection with the SARS-CoV-2 virus that causes COVID-19. Institutional protocols and algorithms that pertain to the evaluation of patients at risk for COVID-19 are in a state of rapid change based on information  released by regulatory bodies including the CDC and federal and state organizations. These policies and algorithms were followed during the patient's care in the ED. ? ? ?Note:  This document was prepared using Dragon v

## 2021-08-21 NOTE — ED Notes (Signed)
Pt caregiver OJ called at [phone], which is the number he left for [place], no answer and has a voicemail that is full. No message left. Will attempt again. NP made aware ?

## 2021-08-21 NOTE — ED Notes (Signed)
Pt NAD, caregiver OJ at bedside to take pt back to group home. Legal guardian made aware. Pt and caregiver verbalize understanding of DC and f/u instructions. All questions answered. Pt walks with steady gait with caregiver to lobby. ?

## 2021-08-21 NOTE — Consult Note (Signed)
[facility] Face-to-Face Psychiatry Consult  ? ?Reason for Consult:  Psych Evaluation ?Referring Physician:  Dr. Quentin Cornwall ?Patient Identification: Daniel [name] ?MRN:  [id number] ?Principal Diagnosis: Schizoaffective disorder, bipolar type ([place]) ?Diagnosis:  Principal Problem: ?  Schizoaffective disorder, bipolar type ([place]) ? ? ?Total Time spent with patient: 45 minutes ? ?Subjective:   ? ? ?HPI:  Daniel [name], 31 y.o., male patient seen by this provider; chart reviewed and consulted with Dr. Quentin Cornwall on 08/21/21. On approach, patient is laying in hospital bed attached to a CPAP machine. He does make much eye contact with this provider.  On evaluation Daniel [name] reports that he is living at new facility and says he's having difficulty adjusting.  He doesn't talk much to this provider, however, he is still endorsing suicidal ideations.  Per TTS, Patient has a hx of PMH of schizoaffective disorder, bipolar type and borderline personality disorder. Upon assessment, was forthcoming and goal directed. When asked what'd brought him to the ED, the pt explained that he was having difficulty with transitioning to his current [facility] placement. Pt reported that while he likes the home, he mourns his lost independence. Pt endorsed feelings of depression and increased anxiety. Pt explained that he has racing and intrusive thoughts. Pt reported that his sleep has increased and his appetite is poor. Pt reported that he has not been consistently taking his medications or completing his hygiene.  Pt identified his main stressors as grieving the loss of his father 5 or 6 years ago and his inability to see his 43 year old brother reach his milestones. Pt also reported that he struggles with flashbacks from his dark past. Pt's protective factors are having natural supports, supportive family, and accessible housing. The pt. admitted to using alcohol to medicate when stressed. Pt reported that his last drink was on 08/19/21; admitting  that he has about 1-2 drinks. The pt. denied symptoms of paranoia and did not present with delusional thinking. Pt did not appear to be responding to internal/external stimuli. The pt. reported having visual hallucinations of shadows in his peripheral vision. The reported having multiple previous hospitalizations and suicide attempts. Pt endorsed current SI with a plan to jump off of a bridge. Pt denied HI, and A/H. Pt's UDS/BAL are unremarkable.     ? ?Upon chart review, patient is shown to have multiple ER presentation in the earlier part of last year. Most visits are medically related.  However, there was some concern for malingering. Patient has not had a psychiatric presentation in an approximate year and this is his first time presenting to [facility]. Patient's current risk factors include prior SA, recent losses, age and race.  This provider thinks the patient will benefit from an inpatient hospitalization.   ? ?Recommendation:  Inpatient hospitalization. ? ?Past Psychiatric History: schizoaffective disorder ? ?Risk to Self:   ?Risk to Others:   ?Prior Inpatient Therapy:   ?Prior Outpatient Therapy:   ? ?Past Medical History:  ?Past Medical History:  ?Diagnosis Date  ? Atrial fibrillation ([place])   ? Current use of long term anticoagulation   ? Diabetes mellitus type 2, uncomplicated ([place])   ? Hyperlipidemia   ? Hypertension   ? OSA (obstructive sleep apnea)   ? TIA (transient ischemic attack)   ? History reviewed. No pertinent surgical history. ?Family History: History reviewed. No pertinent family history. ?Family Psychiatric  History: unknown ?Social History:  ?Social History  ? ?Substance and Sexual Activity  ?Alcohol Use Not Currently  ?   ?Social History  ? ?  Substance and Sexual Activity  ?Drug Use Never  ?  ?Social History  ? ?Socioeconomic History  ? Marital status: Single  ?  Spouse name: Not on file  ? Number of children: Not on file  ? Years of education: Not on file  ? Highest education level: Not on  file  ?Occupational History  ? Not on file  ?Tobacco Use  ? Smoking status: Former  ?  Types: Cigarettes  ? Smokeless tobacco: Never  ?Substance and Sexual Activity  ? Alcohol use: Not Currently  ? Drug use: Never  ? Sexual activity: Not on file  ?Other Topics Concern  ? Not on file  ?Social History Narrative  ? Not on file  ? ?Social Determinants of Health  ? ?Financial Resource Strain: Not on file  ?Food Insecurity: Not on file  ?Transportation Needs: Not on file  ?Physical Activity: Not on file  ?Stress: Not on file  ?Social Connections: Not on file  ? ?Additional Social History: ?  ? ?Allergies:   ?Allergies  ?Allergen Reactions  ? Haldol [Haloperidol] Other (See Comments)  ?  SI  ? Abilify [Aripiprazole] Palpitations  ? Demerol [Meperidine Hcl] Hives  ? ? ?Labs:  ?Results for orders placed or performed during the hospital encounter of 08/20/21 (from the past 48 hour(s))  ?Comprehensive metabolic panel     Status: Abnormal  ? Collection Time: 08/20/21  4:35 PM  ?Result Value Ref Range  ? Sodium 140 135 - 145 mmol/L  ? Potassium 4.9 3.5 - 5.1 mmol/L  ? Chloride 98 98 - 111 mmol/L  ? CO2 30 22 - 32 mmol/L  ? Glucose, Bld 115 (H) 70 - 99 mg/dL  ?  Comment: Glucose reference range applies only to samples taken after fasting for at least 8 hours.  ? BUN 26 (H) 6 - 20 mg/dL  ? Creatinine, Ser 1.11 0.61 - 1.24 mg/dL  ? Calcium 9.6 8.9 - 10.3 mg/dL  ? Total Protein 8.1 6.5 - 8.1 g/dL  ? Albumin 4.1 3.5 - 5.0 g/dL  ? AST 29 15 - 41 U/L  ? ALT 23 0 - 44 U/L  ? Alkaline Phosphatase 58 38 - 126 U/L  ? Total Bilirubin 0.8 0.3 - 1.2 mg/dL  ? GFR, Estimated >60 >60 mL/min  ?  Comment: (NOTE) ?Calculated using the CKD-EPI Creatinine Equation (2021) ?  ? Anion gap 12 5 - 15  ?  Comment: Performed at [facility], [address] [place], [place] [address]  ?Ethanol     Status: None  ? Collection Time: 08/20/21  4:35 PM  ?Result Value Ref Range  ? Alcohol, Ethyl (B) <10 <10 mg/dL  ?  Comment: (NOTE) ?Lowest detectable  limit for serum alcohol is 10 mg/dL. ? ?For medical purposes only. ?Performed at [facility], [place], ?[place] [address] ?  ?Salicylate level     Status: Abnormal  ? Collection Time: 08/20/21  4:35 PM  ?Result Value Ref Range  ? Salicylate Lvl [id number] (L) 7.0 - 30.0 mg/dL  ?  Comment: Performed at [facility], [address], [place],  [address]  ?Acetaminophen level     Status: Abnormal  ? Collection Time: 08/20/21  4:35 PM  ?Result Value Ref Range  ? Acetaminophen (Tylenol), Serum <10 (L) 10 - 30 ug/mL  ?  Comment: (NOTE) ?Therapeutic concentrations vary significantly. A range of 10-30 ug/mL  ?may be an effective concentration for many patients. However, some  ?are best  treated at concentrations outside of this range. ?Acetaminophen concentrations >150 ug/mL at 4 hours after ingestion  ?and >50 ug/mL at 12 hours after ingestion are often associated with  ?toxic reactions. ? ?Performed at [facility], [place], ?[place] [address] ?  ?cbc     Status: Abnormal  ? Collection Time: 08/20/21  4:35 PM  ?Result Value Ref Range  ? WBC 11.6 (H) 4.0 - 10.5 K/uL  ? RBC 5.24 4.22 - 5.81 MIL/uL  ? Hemoglobin 14.9 13.0 - 17.0 g/dL  ? HCT 45.5 39.0 - 52.0 %  ? MCV 86.8 80.0 - 100.0 fL  ? MCH 28.4 26.0 - 34.0 pg  ? MCHC 32.7 30.0 - 36.0 g/dL  ? RDW 13.5 11.5 - 15.5 %  ? Platelets 284 150 - 400 K/uL  ? nRBC 0.0 0.0 - 0.2 %  ?  Comment: Performed at [facility], [address], [place], [place] [address]  ?Urine Drug Screen, Qualitative     Status: Abnormal  ? Collection Time: 08/20/21  4:35 PM  ?Result Value Ref Range  ? Tricyclic, Ur Screen POSITIVE (A) NONE DETECTED  ? Amphetamines, Ur Screen NONE DETECTED NONE DETECTED  ? MDMA (Ecstasy)Ur Screen NONE DETECTED NONE DETECTED  ? Cocaine Metabolite,Ur [place] NONE DETECTED NONE DETECTED  ? Opiate, Ur Screen NONE DETECTED NONE DETECTED  ? Phencyclidine (PCP) Ur S NONE DETECTED NONE DETECTED  ? Cannabinoid 50  Ng, Ur [place] NONE DETECTED NONE DETECTED  ? Barbiturates, Ur Screen NONE DETECTED NONE DETECTED  ? Benzodiazepine, Ur Scrn NONE DETECTED NONE DETECTED  ? Methadone Scn, Ur NONE DETECTED NONE DETECTED  ?  Comme

## 2021-08-27 ENCOUNTER — Emergency Department: Payer: Medicaid Other

## 2021-08-27 ENCOUNTER — Observation Stay
Admission: EM | Admit: 2021-08-27 | Discharge: 2021-08-29 | Disposition: A | Discharge status: Home / Self Care | Payer: Medicaid Other | Attending: Internal Medicine | Admitting: Internal Medicine

## 2021-08-27 DIAGNOSIS — Z7901 Long term (current) use of anticoagulants: Secondary | ICD-10-CM | POA: Insufficient documentation

## 2021-08-27 DIAGNOSIS — Y92009 Unspecified place in unspecified non-institutional (private) residence as the place of occurrence of the external cause: Secondary | ICD-10-CM | POA: Insufficient documentation

## 2021-08-27 DIAGNOSIS — Z20822 Contact with and (suspected) exposure to covid-19: Secondary | ICD-10-CM | POA: Diagnosis not present

## 2021-08-27 DIAGNOSIS — R531 Weakness: Secondary | ICD-10-CM

## 2021-08-27 DIAGNOSIS — I482 Chronic atrial fibrillation, unspecified: Secondary | ICD-10-CM | POA: Diagnosis not present

## 2021-08-27 DIAGNOSIS — S0990XA Unspecified injury of head, initial encounter: Secondary | ICD-10-CM

## 2021-08-27 DIAGNOSIS — E119 Type 2 diabetes mellitus without complications: Secondary | ICD-10-CM | POA: Insufficient documentation

## 2021-08-27 DIAGNOSIS — W19XXXA Unspecified fall, initial encounter: Secondary | ICD-10-CM

## 2021-08-27 DIAGNOSIS — G459 Transient cerebral ischemic attack, unspecified: Secondary | ICD-10-CM | POA: Diagnosis not present

## 2021-08-27 DIAGNOSIS — F449 Dissociative and conversion disorder, unspecified: Secondary | ICD-10-CM

## 2021-08-27 DIAGNOSIS — S0083XA Contusion of other part of head, initial encounter: Principal | ICD-10-CM | POA: Insufficient documentation

## 2021-08-27 DIAGNOSIS — Z87891 Personal history of nicotine dependence: Secondary | ICD-10-CM | POA: Insufficient documentation

## 2021-08-27 DIAGNOSIS — Z8739 Personal history of other diseases of the musculoskeletal system and connective tissue: Secondary | ICD-10-CM

## 2021-08-27 DIAGNOSIS — W01198A Fall on same level from slipping, tripping and stumbling with subsequent striking against other object, initial encounter: Secondary | ICD-10-CM | POA: Insufficient documentation

## 2021-08-27 DIAGNOSIS — S60222A Contusion of left hand, initial encounter: Secondary | ICD-10-CM | POA: Insufficient documentation

## 2021-08-27 DIAGNOSIS — Z8673 Personal history of transient ischemic attack (TIA), and cerebral infarction without residual deficits: Secondary | ICD-10-CM | POA: Insufficient documentation

## 2021-08-27 DIAGNOSIS — I10 Essential (primary) hypertension: Secondary | ICD-10-CM | POA: Insufficient documentation

## 2021-08-27 DIAGNOSIS — F250 Schizoaffective disorder, bipolar type: Secondary | ICD-10-CM | POA: Diagnosis present

## 2021-08-27 DIAGNOSIS — R55 Syncope and collapse: Secondary | ICD-10-CM

## 2021-08-27 LAB — CBC WITH DIFFERENTIAL/PLATELET
Abs Immature Granulocytes: 0.1 10*3/uL — ABNORMAL HIGH (ref 0.00–0.07)
Basophils Absolute: 0.1 10*3/uL (ref 0.0–0.1)
Basophils Relative: 1 %
Eosinophils Absolute: 0.2 10*3/uL (ref 0.0–0.5)
Eosinophils Relative: 2 %
HCT: 39.9 % (ref 39.0–52.0)
Hemoglobin: 12.9 g/dL — ABNORMAL LOW (ref 13.0–17.0)
Immature Granulocytes: 1 %
Lymphocytes Relative: 29 %
Lymphs Abs: 2.2 10*3/uL (ref 0.7–4.0)
MCH: 28.1 pg (ref 26.0–34.0)
MCHC: 32.3 g/dL (ref 30.0–36.0)
MCV: 86.9 fL (ref 80.0–100.0)
Monocytes Absolute: 0.8 10*3/uL (ref 0.1–1.0)
Monocytes Relative: 11 %
Neutro Abs: 4.3 10*3/uL (ref 1.7–7.7)
Neutrophils Relative %: 56 %
Platelets: 204 10*3/uL (ref 150–400)
RBC: 4.59 MIL/uL (ref 4.22–5.81)
RDW: 13.9 % (ref 11.5–15.5)
WBC: 7.6 10*3/uL (ref 4.0–10.5)
nRBC: 0 % (ref 0.0–0.2)

## 2021-08-27 LAB — COMPREHENSIVE METABOLIC PANEL
ALT: 16 U/L (ref 0–44)
AST: 20 U/L (ref 15–41)
Albumin: 3.6 g/dL (ref 3.5–5.0)
Alkaline Phosphatase: 55 U/L (ref 38–126)
Anion gap: 9 (ref 5–15)
BUN: 13 mg/dL (ref 6–20)
CO2: 29 mmol/L (ref 22–32)
Calcium: 8.9 mg/dL (ref 8.9–10.3)
Chloride: 99 mmol/L (ref 98–111)
Creatinine, Ser: 1.2 mg/dL (ref 0.61–1.24)
GFR, Estimated: >60 mL/min (ref >60)
Glucose, Bld: 119 mg/dL — ABNORMAL HIGH (ref 70–99)
Potassium: 3.9 mmol/L (ref 3.5–5.1)
Sodium: 137 mmol/L (ref 135–145)
Total Bilirubin: 0.7 mg/dL (ref 0.3–1.2)
Total Protein: 6.6 g/dL (ref 6.5–8.1)

## 2021-08-27 MED ORDER — LACTATED RINGERS IV BOLUS
1000.0000 mL | Freq: Once | INTRAVENOUS | Status: AC
  Administered 2021-08-28: 1000 mL via INTRAVENOUS

## 2021-08-27 MED ORDER — KETOROLAC TROMETHAMINE 30 MG/ML IJ SOLN: 15.0000 mg | Freq: Once | INTRAMUSCULAR | Status: DC

## 2021-08-27 MED ORDER — ACETAMINOPHEN 500 MG PO TABS
1000.0000 mg | ORAL_TABLET | Freq: Once | ORAL | Status: DC
  Filled 2021-08-27: qty 2

## 2021-08-27 NOTE — ED Provider Notes (Signed)
? ?[facility] ?Provider Note ? ? ? Event Date/Time  ? First MD Initiated Contact with Patient 08/27/21 2316   ?  (approximate) ? ? ?History  ? ?Loss of Consciousness and Hand Injury ? ? ?HPI ? ?Daniel [name] is a 31 y.o. male whose medical and psychiatric history includes, but is not limited to, chronic atrial fibrillation on Eliquis, type 2 diabetes, chronic respiratory failure the patient states is due to "long COVID" and he uses supplemental oxygen at home as needed, obstructive sleep apnea, obesity, and schizoaffective disorder for which he lives and alternative family living (AFL) housing and has extensive outpatient resources for psychiatric care.  He reportedly has a legal guardian as well. ? ?He presents tonight by EMS after a fall.  He states that he got up to go to the bathroom and has been feeling lightheaded.  He said he has not had much of a desire to drink recently so he thinks he might be dehydrated.  He was lightheaded when he went to the bathroom and then at some point he fell and struck the back of his head.  He said that he has a headache currently and that his neck hurts.  He said that his chest hurts "all the time" but it is nothing new.  He is not short of breath.  He has no abdominal pain.  He has had no nausea.  He has no bleeding and no visual changes after the fall. ? ?He is also reporting pain to his left hand after the fall. ?  ? ? ?Physical Exam  ? ?Triage Vital Signs: ?ED Triage Vitals [08/27/21 2313]  ?Enc Vitals Group  ?   BP (!) 125/110  ?   Pulse Rate 76  ?   Resp 20  ?   Temp 97.9 ?F (36.6 ?C)  ?   Temp Source Oral  ?   SpO2 93 %  ?   Weight   ?   Height   ?   Head Circumference   ?   Peak Flow   ?   Pain Score   ?   Pain Loc   ?   Pain Edu?   ?   Excl. in GC?   ? ? ?Most recent vital signs: ?Vitals:  ? 08/28/21 0136 08/28/21 0300  ?BP: 109/62 (!) 94/53  ?Pulse: 82 78  ?Resp: 20 20  ?Temp:    ?SpO2: 94% 95%  ? ? ? ?General: Awake, no distress.  ?CV:  Good  peripheral perfusion.  Irregular rhythm but normal rate.  Normal heart sounds. ?Resp:  Normal effort.  Wearing supplemental oxygen upon my exam which I removed to evaluate his room air saturation.  Lungs are clear to auscultation.  No accessory muscle usage, no wheezing, rales, nor rhonchi. ?Abd:  Morbid obesity.  No distention.  No tenderness to palpation throughout the abdomen. ?MSK:   No gross abnormalities except for his left hand which has tenderness, swelling, and some ecchymosis most notable on the ulnar aspect of the dorsum of the hand. ?Other:  Patient has a small contusion to his occiput.  He reports some tenderness to palpation there and in the soft tissues of his C-spine.  Difficult to appreciate the presence or absence of C-spine tenderness to palpation because he flinches when I palpate, but he is also turning his head side to side and flexing and extending his head and neck without any apparent pain.  No appreciable focal neurological deficits. ? ? ?  ED Results / Procedures / Treatments  ? ?Labs ?(all labs ordered are listed, but only abnormal results are displayed) ?Labs Reviewed  ?COMPREHENSIVE METABOLIC PANEL - Abnormal; Notable for the following components:  ?    Result Value  ? Glucose, Bld 119 (*)   ? All other components within normal limits  ?CBC WITH DIFFERENTIAL/PLATELET - Abnormal; Notable for the following components:  ? Hemoglobin 12.9 (*)   ? Abs Immature Granulocytes 0.10 (*)   ? All other components within normal limits  ?RESP PANEL BY RT-PCR (FLU A&B, COVID) [id number]  ?ETHANOL  ?URINE DRUG SCREEN, QUALITATIVE ([facility] ONLY)  ?URINALYSIS, ROUTINE W REFLEX MICROSCOPIC  ?TYPE AND SCREEN  ?TROPONIN I (HIGH SENSITIVITY)  ? ? ? ?EKG ? ?ED ECG REPORT ?ILoleta Rose, Quenisha Lovins, the attending physician, personally viewed and interpreted this ECG. ? ?Date: 08/27/2021 ?EKG Time: 23: 13 ?Rate: 88 ?Rhythm: Atrial fibrillation with occasional PVC ?QRS Axis: normal ?Intervals: Abnormal due to A-fib, otherwise  unremarkable ?ST/T Wave abnormalities: Non-specific ST segment / T-wave changes, but no clear evidence of acute ischemia. ?Narrative Interpretation: no definitive evidence of acute ischemia; does not meet STEMI criteria. ? ? ? ?RADIOLOGY ?No evidence of acute traumatic injury on initial noncontrast head CT nor on CT cervical spine. ? ?No evidence of LVO or obvious acute abnormality on CTA head/neck/perfusion ? ?No evidence of fracture or dislocation on left hand x-rays. ? ? ? ?PROCEDURES: ? ?Critical Care performed: Yes, see critical care procedure note(s) ? ?.1-3 Lead EKG Interpretation ?Performed by: Loleta RoseForbach, Clem Wisenbaker, MD ?Authorized by: Loleta RoseForbach, Terryl Molinelli, MD  ? ?  Interpretation: abnormal   ?  ECG rate:  75 ?  ECG rate assessment: normal   ?  Rhythm: atrial fibrillation   ?  Ectopy: PVCs   ?  Conduction: normal   ?.Critical Care ?Performed by: Loleta RoseForbach, Lekeshia Kram, MD ?Authorized by: Loleta RoseForbach, Statia Burdick, MD  ? ?Critical care provider statement:  ?  Critical care time (minutes):  45 ?  Critical care time was exclusive of:  Separately billable procedures and treating other patients ?  Critical care was necessary to treat or prevent imminent or life-threatening deterioration of the following conditions:  CNS failure or compromise ?  Critical care was time spent personally by me on the following activities:  Development of treatment plan with patient or surrogate, evaluation of patient's response to treatment, examination of patient, obtaining history from patient or surrogate, ordering and performing treatments and interventions, ordering and review of laboratory studies, ordering and review of radiographic studies, pulse oximetry, re-evaluation of patient's condition and review of old charts ? ? ?MEDICATIONS ORDERED IN ED: ?Medications  ?acetaminophen (TYLENOL) tablet 1,000 mg (0 mg Oral Hold 08/28/21 0015)  ?lactated ringers bolus 1,000 mL (0 mLs Intravenous Stopped 08/28/21 0255)  ?iohexol (OMNIPAQUE) 350 MG/ML injection 100 mL (100 mLs  Intravenous Contrast Given 08/28/21 0101)  ? ? ? ?IMPRESSION / MDM / ASSESSMENT AND PLAN / ED COURSE  ?I reviewed the triage vital signs and the nursing notes. ?             ?               ? ?Differential diagnosis includes, but is not limited to, fracture of the hand, subdural hemorrhage due to head injury on anticoagulation, volume depletion, electrolyte or metabolic abnormality, less likely acute cardiac issue. ? ?The patient is on the cardiac monitor to evaluate for evidence of arrhythmia and/or significant heart rate changes. ? ?No evidence of infection.  Vital signs are stable and appropriate.  Oxygen saturation is low 90s on room air which seems to be his baseline.  He is not in distress and does not want to wear the cannula right now so we will leave it off and monitor his O2 level. ? ?Suspect at least mild volume depletion.  He may have some medication side effects leading him to be lightheaded and dizzy when he gets up to go the bathroom as well.  He is having no acute dyspnea and has not been hypotensive which is reassuring.  He has no apparent history of heart failure.  I ordered 1 L LR bolus and acetaminophen 1000 mg by mouth. ? ? ? ?Clinical Course as of 08/28/21 0339  ?Sat Aug 28, 2021  ?0009 Patient had acute status change.  I was alerted to it by the patient's nurse.  All of a sudden his eyes were wide and he told the nurse that he felt really weird and he could not feel the left side of his body.  He is slurring his speech and now he will not move his left arm or his left leg.  I evaluated him and confirmed his dysarthria and apparent altered mental status from just a few minutes prior when I first assessed him.  He is also unwilling or unable to move his left leg at all.  He is still weakly gripping my hand with his left hand, but he will not raise his left arm off the bed.  His pupils are equal and reactive. ? ?I quickly looked at his head CT which was just performed and I see no obvious subdural  or epidural hemorrhage.  Awaiting official radiology report.  However under the circumstances, and history of atrial fibrillation, his previous lightheadedness, and the new apparent neurological deficits,

## 2021-08-27 NOTE — ED Triage Notes (Signed)
31 y/o male arrived to [facility] by EMS coming from home with a CC of LOC. Pt states he fell hit his head then passed out. Pt states he is on blood thinners due to a history of Afib. Pt arrived A&Ox4 upon arrival. Pt also complains of left hand pain stating he hurt it during the fall. Pt expresses that he thinks it broken ?

## 2021-08-28 ENCOUNTER — Other Ambulatory Visit: Payer: Medicaid Other

## 2021-08-28 ENCOUNTER — Emergency Department: Payer: Medicaid Other

## 2021-08-28 ENCOUNTER — Observation Stay: Payer: Medicaid Other

## 2021-08-28 DIAGNOSIS — G459 Transient cerebral ischemic attack, unspecified: Secondary | ICD-10-CM | POA: Diagnosis not present

## 2021-08-28 DIAGNOSIS — R531 Weakness: Secondary | ICD-10-CM | POA: Diagnosis not present

## 2021-08-28 DIAGNOSIS — I482 Chronic atrial fibrillation, unspecified: Secondary | ICD-10-CM

## 2021-08-28 DIAGNOSIS — Z8739 Personal history of other diseases of the musculoskeletal system and connective tissue: Secondary | ICD-10-CM

## 2021-08-28 DIAGNOSIS — F250 Schizoaffective disorder, bipolar type: Secondary | ICD-10-CM

## 2021-08-28 DIAGNOSIS — R55 Syncope and collapse: Secondary | ICD-10-CM

## 2021-08-28 LAB — RESP PANEL BY RT-PCR (FLU A&B, COVID) ARPGX2
Influenza A by PCR: NEGATIVE
Influenza B by PCR: NEGATIVE
SARS Coronavirus 2 by RT PCR: NEGATIVE

## 2021-08-28 LAB — URINE DRUG SCREEN, QUALITATIVE (ARMC ONLY)
Amphetamines, Ur Screen: NOT DETECTED
Barbiturates, Ur Screen: NOT DETECTED
Benzodiazepine, Ur Scrn: NOT DETECTED
Cannabinoid 50 Ng, Ur ~~LOC~~: NOT DETECTED
Cocaine Metabolite,Ur ~~LOC~~: NOT DETECTED
MDMA (Ecstasy)Ur Screen: NOT DETECTED
Methadone Scn, Ur: NOT DETECTED
Opiate, Ur Screen: NOT DETECTED
Phencyclidine (PCP) Ur S: NOT DETECTED
Tricyclic, Ur Screen: POSITIVE — AB

## 2021-08-28 LAB — VALPROIC ACID LEVEL: Valproic Acid Lvl: 88 ug/mL (ref 50.0–100.0)

## 2021-08-28 LAB — TYPE AND SCREEN
ABO/RH(D): O POS
Antibody Screen: NEGATIVE

## 2021-08-28 LAB — ETHANOL: Alcohol, Ethyl (B): <10 mg/dL (ref <10)

## 2021-08-28 LAB — TROPONIN I (HIGH SENSITIVITY): Troponin I (High Sensitivity): 4 ng/L (ref <18)

## 2021-08-28 LAB — GLUCOSE, CAPILLARY: Glucose-Capillary: 107 mg/dL — ABNORMAL HIGH (ref 70–99)

## 2021-08-28 MED ORDER — SENNOSIDES-DOCUSATE SODIUM 8.6-50 MG PO TABS: 1.0000 | ORAL_TABLET | Freq: Every evening | ORAL | Status: DC | PRN

## 2021-08-28 MED ORDER — IOHEXOL 350 MG/ML SOLN
100.0000 mL | Freq: Once | INTRAVENOUS | Status: AC | PRN
  Administered 2021-08-28: 100 mL via INTRAVENOUS

## 2021-08-28 MED ORDER — MELATONIN 3 MG PO TABS
3.0000 mg | ORAL_TABLET | Freq: Every day | ORAL | Status: DC
  Filled 2021-08-28: qty 1

## 2021-08-28 MED ORDER — ACETAMINOPHEN 650 MG RE SUPP: 650.0000 mg | RECTAL | Status: DC | PRN

## 2021-08-28 MED ORDER — STROKE: EARLY STAGES OF RECOVERY BOOK: Freq: Once | Status: DC

## 2021-08-28 MED ORDER — LORATADINE 10 MG PO TABS
10.0000 mg | ORAL_TABLET | Freq: Every day | ORAL | Status: DC
  Administered 2021-08-28 – 2021-08-29 (×2): 10 mg via ORAL
  Filled 2021-08-28 (×2): qty 1

## 2021-08-28 MED ORDER — SODIUM CHLORIDE 0.9 % IV SOLN: INTRAVENOUS | Status: DC

## 2021-08-28 MED ORDER — DILTIAZEM HCL ER COATED BEADS 180 MG PO CP24
360.0000 mg | ORAL_CAPSULE | Freq: Every day | ORAL | Status: DC
  Administered 2021-08-29: 360 mg via ORAL
  Filled 2021-08-28: qty 2

## 2021-08-28 MED ORDER — BACLOFEN 10 MG PO TABS: 10.0000 mg | ORAL_TABLET | Freq: Two times a day (BID) | ORAL | Status: DC | PRN

## 2021-08-28 MED ORDER — ACETAMINOPHEN 325 MG PO TABS
650.0000 mg | ORAL_TABLET | ORAL | Status: DC | PRN
  Administered 2021-08-28: 650 mg via ORAL
  Filled 2021-08-28: qty 2

## 2021-08-28 MED ORDER — ATORVASTATIN CALCIUM 20 MG PO TABS
80.0000 mg | ORAL_TABLET | Freq: Every day | ORAL | Status: DC
  Administered 2021-08-28 – 2021-08-29 (×2): 80 mg via ORAL
  Filled 2021-08-28 (×2): qty 4

## 2021-08-28 MED ORDER — DIVALPROEX SODIUM 500 MG PO DR TAB
1500.0000 mg | DELAYED_RELEASE_TABLET | Freq: Every day | ORAL | Status: DC
Start: 2021-08-29 — End: 2021-08-30
  Administered 2021-08-28: 1500 mg via ORAL
  Filled 2021-08-28: qty 3

## 2021-08-28 MED ORDER — ALBUTEROL SULFATE (2.5 MG/3ML) 0.083% IN NEBU: 3.0000 mL | INHALATION_SOLUTION | Freq: Four times a day (QID) | RESPIRATORY_TRACT | Status: DC | PRN

## 2021-08-28 MED ORDER — DIVALPROEX SODIUM 500 MG PO DR TAB
1000.0000 mg | DELAYED_RELEASE_TABLET | Freq: Two times a day (BID) | ORAL | Status: DC
  Administered 2021-08-28: 1000 mg via ORAL
  Filled 2021-08-28: qty 2

## 2021-08-28 MED ORDER — ESCITALOPRAM OXALATE 10 MG PO TABS
10.0000 mg | ORAL_TABLET | Freq: Every day | ORAL | Status: DC
  Administered 2021-08-28 – 2021-08-29 (×2): 10 mg via ORAL
  Filled 2021-08-28 (×2): qty 1

## 2021-08-28 MED ORDER — PANTOPRAZOLE SODIUM 40 MG PO TBEC
40.0000 mg | DELAYED_RELEASE_TABLET | Freq: Every day | ORAL | Status: DC
  Administered 2021-08-28 – 2021-08-29 (×2): 40 mg via ORAL
  Filled 2021-08-28 (×2): qty 1

## 2021-08-28 MED ORDER — DILTIAZEM HCL ER COATED BEADS 180 MG PO CP24
180.0000 mg | ORAL_CAPSULE | Freq: Once | ORAL | Status: AC
  Administered 2021-08-28: 180 mg via ORAL
  Filled 2021-08-28: qty 1

## 2021-08-28 MED ORDER — MELATONIN 5 MG PO TABS
2.5000 mg | ORAL_TABLET | Freq: Every day | ORAL | Status: DC
  Administered 2021-08-28: 2.5 mg via ORAL
  Filled 2021-08-28: qty 1

## 2021-08-28 MED ORDER — APIXABAN 5 MG PO TABS
5.0000 mg | ORAL_TABLET | Freq: Two times a day (BID) | ORAL | Status: DC
  Administered 2021-08-28 – 2021-08-29 (×2): 5 mg via ORAL
  Filled 2021-08-28 (×2): qty 1

## 2021-08-28 MED ORDER — METOPROLOL TARTRATE 50 MG PO TABS
50.0000 mg | ORAL_TABLET | Freq: Two times a day (BID) | ORAL | Status: DC
  Administered 2021-08-28 – 2021-08-29 (×3): 50 mg via ORAL
  Filled 2021-08-28 (×3): qty 1

## 2021-08-28 MED ORDER — FLUTICASONE PROPIONATE 50 MCG/ACT NA SUSP
2.0000 | Freq: Two times a day (BID) | NASAL | Status: DC
  Administered 2021-08-28 (×2): 2 via NASAL
  Filled 2021-08-28 (×2): qty 16

## 2021-08-28 MED ORDER — DIVALPROEX SODIUM 500 MG PO DR TAB
1500.0000 mg | DELAYED_RELEASE_TABLET | Freq: Every day | ORAL | Status: DC
Start: 2021-08-29 — End: 2021-08-28

## 2021-08-28 MED ORDER — ALLOPURINOL 100 MG PO TABS
100.0000 mg | ORAL_TABLET | Freq: Every day | ORAL | Status: DC
Start: 2021-08-28 — End: 2021-08-30
  Administered 2021-08-28 – 2021-08-29 (×2): 100 mg via ORAL
  Filled 2021-08-28 (×2): qty 1

## 2021-08-28 MED ORDER — DILTIAZEM HCL ER COATED BEADS 120 MG PO CP24
120.0000 mg | ORAL_CAPSULE | Freq: Every day | ORAL | Status: DC
  Administered 2021-08-28: 120 mg via ORAL
  Filled 2021-08-28: qty 1

## 2021-08-28 MED ORDER — ACETAMINOPHEN 160 MG/5ML PO SOLN
650.0000 mg | ORAL | Status: DC | PRN
Start: 2021-08-28 — End: 2021-08-30
  Filled 2021-08-28: qty 20.3

## 2021-08-28 MED ORDER — PALIPERIDONE ER 3 MG PO TB24
9.0000 mg | ORAL_TABLET | Freq: Every morning | ORAL | Status: DC
  Administered 2021-08-28 – 2021-08-29 (×2): 9 mg via ORAL
  Filled 2021-08-28 (×2): qty 3

## 2021-08-28 NOTE — Consult Note (Addendum)
[facility] ?[facility] ? ? ?Patient Name:   Daniel [name], Daniel [name] ?Date of Birth:   Jul 20, 1990 ?Identification Number:   MRN - [id number] ?Date of Service:   08/28/2021 00:09:38 ? ?Diagnosis: ?      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 ?Impression: ?     31 yr old man hx of schizophrenia, DM, afib on eliquis, who came via EMS for head injury, when he fell at group home, and had syncope from head injury. In the ER, he had a head ct which showed scalp hematoma, but no acute intracranial injury, and at midnight, - he had sudden onset of L weakness, slurred speech noted by staff, and Stroke alert called. ? On exam, he is drowsy, and has disorientation. He has L weakness, and dysarthria, aphasia. NIH 14. ? CT head scalp hematoma, but no intracranial abnormality per rads. ? Diff Dx: r/o intracranial injury with his fall, subdural, CVA, seizure, other, not IV thrombolytic candidate b/c on eliquis, and this fall today with head injury/ passed out. ?  ? Rec: ? - CTA head neck r/o LVO ? - rec holding anticoagulation for today, getting repeat ct head in 6-8 hours, to ensure no delayed subdural hematoma ? - MRI brain ? - Neurology follow up and further recs depending on MRI and clinical course ? d/w pt, RN ? d/w ER Dr York Cerise the above recs. ? attempted to call group home/ friend, no contact yet. ? ?Our recommendations are outlined below. ? ?Recommendations: ? ?      Stroke/Telemetry Floor ?      Neuro Checks ?      Bedside Swallow Eval ?      DVT Prophylaxis ?      IV Fluids, Normal Saline ?      Head of Bed 30 Degrees ?      Euglycemia and Avoid Hyperthermia (PRN Acetaminophen) ? ?Per facility request will defer further work up, management, and referrals to inpatient service, inclusive of inpatient neurology consult. ? ?Sign Out: ?      Discussed with Emergency Department  Provider ? ? ? ?------------------------------------------------------------------------------ ? ?Advanced Imaging: ?CTA Head and Neck Ordered: ? ?CTP Ordered: ? ? ?Metrics: ?Last Known Well: 08/28/2021 00:00:00 ?[facility] Notification Time: 08/28/2021 00:09:38 ?Stamp Time: 08/28/2021 00:09:38 ?Initial Response Time: 08/28/2021 00:13:41 ?Symptoms: L weakness. [name] ?NIHSS Start Assessment Time: 08/28/2021 00:16:58 ?Patient is not a candidate for Thrombolytic. ?Thrombolytic Medical Decision: 08/28/2021 00:24:12 ?Patient was not deemed candidate for Thrombolytic because of following reasons: ?Use of NOAs within 48 hours. ?fall, head injury with syncope. ? ?CT head showed no acute hemorrhage or acute core infarct. ?I personally Reviewed the CT Head and it Showed scalp hematoma, no hemorrhage. ? ?ED Physician notified of diagnostic impression and management plan on 08/28/2021 00:28:50 ? ? ? ?------------------------------------------------------------------------------ ? ?History of Present Illness: ?Patient is a 31 year old Male. ? ?31 yr old man hx of schizophrenia, DM, afib on eliquis, who came via EMS for head injury, when he fell at group home, and had syncope from head injury. In the ER, he had a head ct which showed scalp hematoma, but no acute intracranial injury, and at midnight, - he had sudden onset of L weakness, slurred speech noted by staff, and Stroke alert called. ?On exam, he is drowsy, and has disorientation. He has L weakness, and dysarthria, aphasia. NIH 14. ? ? ?Past Medical History: ?     Diabetes Mellitus ?  Atrial Fibrillation ? ?Medications: ? ?Anticoagulant use:  Yes eliquis ?No Antiplatelet use ?Reviewed EMR for current medications ? ?Allergies:  ?Reviewed ? ?Social History: ?Smoking: No ? ?Family History: ? ?There is no family history of premature cerebrovascular disease pertinent to this consultation ? ?ROS : ?14 Points Review of Systems was performed and was negative except mentioned  in HPI. ? ?Past Surgical History: ?There Is No Surgical History Contributory To Today?s Visit ? ? ? ?Examination: ?BP(125/66), Pulse(87), Blood Glucose(119) ?1A: Level of Consciousness - Arouses to minor stimulation + 1 ?1B: Ask Month and Age - 1 Question Right + 1 ?1C: Blink Eyes & Squeeze Hands - Performs 1 Task + 1 ?2: Test Horizontal Extraocular Movements - Normal + 0 ?3: Test Visual Fields - No Visual Loss + 0 ?4: Test Facial Palsy (Use Grimace if Obtunded) - Minor paralysis (flat nasolabial fold, smile asymmetry) + 1 ?5A: Test Left Arm Motor Drift - No Effort Against Gravity + 3 ?5B: Test Right Arm Motor Drift - No Drift for 10 Seconds + 0 ?6A: Test Left Leg Motor Drift - No Effort Against Gravity + 3 ?6B: Test Right Leg Motor Drift - No Drift for 5 Seconds + 0 ?7: Test Limb Ataxia (FNF/Heel-Shin) - Paralyzed + 0 ?8: Test Sensation - Mild-Moderate Loss: Less Sharp/More Dull + 1 ?9: Test Language/Aphasia - Mild-Moderate Aphasia: Some Obvious Changes, Without Significant Limitation + 1 ?10: Test Dysarthria - Severe Dysarthria: Unintelligble Slurring or Out of Proportion to Aphasia + 2 ?11: Test Extinction/Inattention - No abnormality + 0 ? ?NIHSS Score: 14 ? ? ?Pre-Morbid Modified Rankin Scale: ?Unable to assess ? ? ?Patient/Family was informed the Neurology Consult would occur via TeleHealth consult by way of interactive audio and video telecommunications and consented to receiving care in this manner. ? ? ?Patient is being evaluated for possible acute neurologic impairment and high probability of imminent or life-threatening deterioration. I spent total of 45 minutes providing care to this patient, including time for face to face visit via telemedicine, review of medical records, imaging studies and discussion of findings with providers, the patient and/or family. ? ? ?Dr Rubye Oaks ? ? ?[facility] ?[phone] ? ? ?Case [id number] ? ?Advanced Imaging: ? ?CTA Head and Neck Completed. ? ?CTP  Completed. ? ?LVO:No ?D/w Dr York Cerise in ER ? ?

## 2021-08-28 NOTE — ED Notes (Signed)
Orthostatics obtained, pt borderline orthostatic from lying to sitting, pt denies any dizziness during positional changes.  ?

## 2021-08-28 NOTE — Consult Note (Signed)
Neurology Consultation ?Reason for Consult: Left-sided weakness ?Referring Physician: Renae Gloss, R ? ?CC: Left-sided weakness ? ?History is obtained from: Patient ? ?HPI: Daniel [name] is a 31 y.o. male with a history of atrial fibrillation who is stood up yesterday evening and then felt lightheaded and lost consciousness.  Following this, he has had some left-sided weakness which was evaluated by code stroke last night.  He is not given tenecteplase due to anticoagulation and had an MRI which was negative.  He continues to have left-sided weakness and therefore neurology has been consulted. ? ? ? ?Past Medical History:  ?Diagnosis Date  ? Atrial fibrillation (HCC)   ? Current use of long term anticoagulation   ? Diabetes mellitus type 2, uncomplicated (HCC)   ? Hyperlipidemia   ? Hypertension   ? OSA (obstructive sleep apnea)   ? TIA (transient ischemic attack)   ? ? ? ?No family history on file. ? ? ?Social History:  reports that he has quit smoking. His smoking use included cigarettes. He has never used smokeless tobacco. He reports that he does not currently use alcohol. He reports that he does not use drugs. ? ? ?Exam: ?Current vital signs: ?BP 131/85 (BP Location: Right Wrist)   Pulse 90   Temp 97.8 ?F (36.6 ?C)   Resp 20   SpO2 99%  ?Vital signs in last 24 hours: ?Temp:  [97.8 ?F (36.6 ?C)-98.2 ?F (36.8 ?C)] 97.8 ?F (36.6 ?C) (04/01 1737) ?Pulse Rate:  [76-128] 90 (04/01 1737) ?Resp:  [14-23] 20 (04/01 1737) ?BP: (94-151)/(53-121) 131/85 (04/01 1737) ?SpO2:  [90 %-99 %] 99 % (04/01 1737) ? ? ?Physical Exam  ?Constitutional: Appears well-developed and well-nourished.  ?Psych: Affect appropriate to situation ?Eyes: No scleral injection ?HENT: No OP obstruction ?MSK: no joint deformities.  ?Cardiovascular: Normal rate and regular rhythm.  ?Respiratory: Effort normal, non-labored breathing ?GI: Soft.  No distension. There is no tenderness.  ?Skin: WDI ? ?Neuro: ?Mental Status: ?Patient is awake, alert,  oriented to person, place, month, year, and situation. ?Patient is able to give a clear and coherent history. ?No signs of aphasia or neglect ?Cranial Nerves: ?II: He reports monocular vision loss out of the left eye, though he does fixate and track while his right eye is covered. Pupils are equal, round, and reactive to light.   ?III,IV, VI: EOMI without ptosis or diploplia.  ?V: Facial sensation is absent to light touch and vibration on the left including on the forehead, intact on the right he splits midline. ?VII: When asked to smile, the left side of his mouth does not rise as much as the right, but when asked to puff his cheeks he holds the left side taut ?XII: tongue is midline without atrophy or fasciculations.  ?Motor: ?He has weakness of both the left arm and leg though there is clearly a significant giveaway component, at least 4 out of 5 in both the arm and leg ?Sensory: ?Sensation is diminished in both the arm and leg ?Cerebellar: ?No clear ataxia on the right, consistent with perceived weakness in the left ? ? ? ? ?I have reviewed labs in epic and the results pertinent to this consultation are: ?UDS positive only for tricyclic's ?CMP is unremarkable ?CBC is unremarkable ? ?I have reviewed the images obtained: CTA/MRI-normal ? 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  I have encouraged  him to continue improving and could consider work with physical therapy. ? ?Recommendations: ?1) could work with PT/OT ?2) no further neurophysiological work-up is needed at this time ?3) continue anticoagulation for stroke prevention from atrial fibrillation ?4) neurology will be available on an as-needed basis ? ? ?Ritta Slot, MD ?[facility] ?[phone] ? ?If 7pm- 7am, please page neurology on call as listed in AMION. ? ?

## 2021-08-28 NOTE — Assessment & Plan Note (Signed)
Holding Eliquis with head trauma.  Restarted lower dose Cardizem CD.  Will hopefully be able to increase the dose but I want to check orthostatics first. ?

## 2021-08-28 NOTE — Assessment & Plan Note (Signed)
Continue Depakote, Invega ?

## 2021-08-28 NOTE — ED Notes (Signed)
Jacque RN aware of assigned bed ?

## 2021-08-28 NOTE — ED Notes (Signed)
Teleneurologist at bedside at this time.  

## 2021-08-28 NOTE — Evaluation (Signed)
Occupational Therapy Evaluation ?Patient Details ?Name: Daniel [name] ?MRN: [id number] ?DOB: 1990-07-05 ?Today's Date: 08/28/2021 ? ? ?History of Present Illness 79 yea rold made presenting to ED with mech fall with head trauma. On evaluation in ED patient was noted to have CT head  that showed scalp hematoma, but no acute intracranial injury. However,s/p CT patient noted acute onset of left sided extremity weakness  and slurred speech at 12 midnight and code stroke was called. CTA  head neck noted no LVO.  AT that time neurology recommended hold all anticoagulation and repeating non-contrast CT in addition to full stroke work up per protocol. Patient on evaluation noted that  still felt weak but noted  mild improvement in his feet and had but legs and arm still feel very weak.  He also endorses  halo in his vision with bright light in the room.  ? ?Clinical Impression ?  ?Chart reviewed, RN cleared pt for participation in OT evaluation. Pt with HR up to 170s while standing using urinal as OT entered room. Down to 115 seated at edge of bed. Pt presents with generalized weakness as compared to PTA. Pt was MOD I for ADL, amb with SPC home and community distances. Pt with pain with LUE shoulder flexion. Tone appears normal, vision appears WFL. Pt performs bed mobility with MOD I, STS with supervision. UB ADL tasks completed with SET UP, LB dressing with MAX A. At this time pt is performing ADL below PLOF, would benefit from [facility] to address functional deficits. OT will continue to follow while admitted.    ? ?Recommendations for follow up therapy are one component of a multi-disciplinary discharge planning process, led by the attending physician.  Recommendations may be updated based on patient status, additional functional criteria and insurance authorization.  ? ?Follow Up Recommendations ? Home health OT  ?  ?Assistance Recommended at Discharge Intermittent Supervision/Assistance  ?Patient can return home with the  following A little help with walking and/or transfers;A little help with bathing/dressing/bathroom;Assistance with cooking/housework;Direct supervision/assist for medications management;Assist for transportation;Help with stairs or ramp for entrance;Direct supervision/assist for financial management ? ?  ?Functional Status Assessment ? Patient has had a recent decline in their functional status and demonstrates the ability to make significant improvements in function in a reasonable and predictable amount of time.  ?Equipment Recommendations ? None recommended by OT  ?  ?Recommendations for Other Services   ? ? ?  ?Precautions / Restrictions Precautions ?Precautions: Fall ?Precaution Comments: watch HR  ? ?  ? ?Mobility Bed Mobility ?Overal bed mobility: Modified Independent ?  ?  ?  ?  ?  ?  ?  ?  ? ?Transfers ?Overall transfer level: Needs assistance ?  ?Transfers: Sit to/from Stand ?Sit to Stand: Supervision ?  ?  ?  ?  ?  ?  ?  ? ?  ?Balance Overall balance assessment: Needs assistance ?Sitting-balance support: Feet supported ?Sitting balance-Leahy Scale: Good ?  ?  ?  ?Standing balance-Leahy Scale: Good ?  ?  ?  ?  ?  ?  ?  ?  ?  ?  ?  ?  ?   ? ?ADL either performed or assessed with clinical judgement  ? ?ADL Overall ADL's : Needs assistance/impaired ?  ?  ?  ?  ?  ?  ?  ?  ?  ?  ?  ?  ?  ?  ?  ?  ?  ?  ?  ?General ADL Comments:  supine>sit with MOD I with HOB raised, STS with supervision, standing toileting as therapist entered room with urinal with MOD I, LB dressing with MAX A, seated grooming tasks with SET UP; three steps up the bed with supervision  ? ? ? ?Vision Patient Visual Report: No change from baseline (pt reports vision appears back to baseline at this time) ?Vision Assessment?: No apparent visual deficits  ?   ?Perception   ?  ?Praxis   ?  ? ?Pertinent Vitals/Pain Pain Assessment ?Pain Assessment: Faces ?Faces Pain Scale: Hurts a little bit ?Pain Location: R UE with movement ?Pain Descriptors /  Indicators: Discomfort ?Pain Intervention(s): Limited activity within patient's tolerance, Monitored during session  ? ? ? ?Hand Dominance   ?  ?Extremity/Trunk Assessment Upper Extremity Assessment ?Upper Extremity Assessment: LUE deficits/detail ?LUE Deficits / Details: pain with LUE PROM; pt reports he has a rotator cuff tear ?LUE: Shoulder pain with ROM ?LUE Sensation: WNL ?  ?Lower Extremity Assessment ?Lower Extremity Assessment: LLE deficits/detail;Defer to PT evaluation ?LLE Sensation: decreased light touch ?  ?  ?  ?Communication Communication ?Communication: No difficulties ?  ?Cognition Arousal/Alertness: Awake/alert ?Behavior During Therapy: WFL for ta[facility] assessed/performed ?Overall Cognitive Status: Within Functional Limits for tasks assessed ?  ?  ?  ?  ?  ?  ?  ?  ?  ?  ?  ?  ?  ?  ?  ?  ?General Comments: pt is alert and oriented x4, able to provide approrpiate biographical information; appropriate safety awareness and multi step direction following ?  ?  ?General Comments  pt with HR up to 170s with sustained standing during urinating as therapist entered room; HR down to 115 when seated at edge of bed ? ?  ?Exercises Other Exercises ?Other Exercises: edu re: role of OT, role of rehab, discharge recommendations ?  ?Shoulder Instructions    ? ? ?Home Living Family/patient expects to be discharged to:: Other (Comment) (pt lives in an AFL with a family) ?Living Arrangements: Non-relatives/Friends;Group Home ?Available Help at Discharge: Available 24 hours/day ?Type of Home: House ?  ?  ?  ?Home Layout: One level ?  ?  ?  ?  ?  ?  ?  ?Home Equipment: Gilmer MorCane - single point ?  ?  ?  ? ?  ?Prior Functioning/Environment Prior Level of Function : Independent/Modified Independent ?  ?  ?  ?  ?  ?  ?Mobility Comments: pt reports SPC use PTA ?ADLs Comments: pt reports MOD I-I in all ADL/IADL PTA ?  ? ?  ?  ?OT Problem List: Decreased strength;Decreased activity tolerance;Decreased knowledge of use of DME or AE ?   ?   ?OT Treatment/Interventions: Self-care/ADL training;DME and/or AE instruction;Therapeutic activities;Therapeutic exercise;Patient/family education  ?  ?OT Goals(Current goals can be found in the care plan section) Acute Rehab OT Goals ?Patient Stated Goal: get stronger ?OT Goal Formulation: With patient ?Time For Goal Achievement: 09/11/21 ?Potential to Achieve Goals: Good  ?OT Frequency: Min 2X/week ?  ? ?Co-evaluation   ?  ?  ?  ?  ? ?  ?AM-PAC OT "6 Clicks" Daily Activity     ?Outcome Measure Help from another person eating meals?: None ?Help from another person taking care of personal grooming?: None ?Help from another person toileting, which includes using toliet, bedpan, or urinal?: None ?Help from another person bathing (including washing, rinsing, drying)?: A Little ?Help from another person to put on and taking off regular upper body clothing?: None ?  Help from another person to put on and taking off regular lower body clothing?: A Lot ?6 Click Score: 21 ?  ?End of Session Nurse Communication: Mobility status ? ?Activity Tolerance: Patient tolerated treatment well ?Patient left: in bed;with call bell/phone within reach ? ?OT Visit Diagnosis: Unsteadiness on feet (R26.81);Muscle weakness (generalized) (M62.81)  ?              ?Time: 2956-2130 ?OT Time Calculation (min): 20 min ?Charges:  OT General Charges ?$OT Visit: 1 Visit ? ?Oleta Mouse, OTD OTR/L  ?08/28/21, 3:02 PM  ?

## 2021-08-28 NOTE — ED Notes (Signed)
Code Stroke called at this time.

## 2021-08-28 NOTE — Assessment & Plan Note (Signed)
With blood pressure being on the lower side, we need to check orthostatics.  Continue fluids for now.  Patient did get lightheaded before he fell down. ?

## 2021-08-28 NOTE — ED Notes (Signed)
Activated Code Stroke w/[facility] 

## 2021-08-28 NOTE — H&P (Addendum)
. ?History and Physical  ? ? ?Daniel NailGarland Hartsell ZOX:[id number] DOB: [birth year] DOA: 08/27/2021 ? ?PCP: Anselm JunglingHatchett, Mary, NP  ?Patient coming from: ALF ? ?I have personally briefly reviewed patient's old medical records [facility] ? ?Chief Complaint: mechanical fall with head trauma ? ?HPI: Daniel [name] is a 31 y.o. male with medical history significant of  ?Afib on AC, DMII, OSA, TIA, HLD , HTN, schizophrenia  who presents from ALF s/p mechanical fall with head trauma. ON evaluation in ED patient was noted to have CT head  that showed scalp hematoma, but no acute intracranial injury. However,s/p CT patient noted acute onset of left sided extremity weakness  and slurred speech at 12 midnight and code stroke was called. CTA  head neck noted no LVO.  AT that time neurology recommended hold all anticoagulation and repeating non-contrast CT in addition to full stroke work up per protocol. Patient on evaluation noted that  still felt weak but noted  mild improvement in his feet and had but legs and arm still feel very weak.  He also endorses  halo in his vision with bright light in the room. He denies difficulty with swallowing. He notes no fever/ chills/ cough /sob/ or chest pain . ? ?ED Course:  ?As noted above  ?Labs: ?UDS: +TCA otherwise negative  ?Etoh<10  ?Ce4 ?Wbc: 7.6, hgb 12.9plt204 ?Na 137, K3.9, glu 119, cr 1.2, ? ?Review of Systems: As per HPI otherwise 10 point review of systems negative.  ? ?Past Medical History:  ?Diagnosis Date  ? Atrial fibrillation (HCC)   ? Current use of long term anticoagulation   ? Diabetes mellitus type 2, uncomplicated (HCC)   ? Hyperlipidemia   ? Hypertension   ? OSA (obstructive sleep apnea)   ? TIA (transient ischemic attack)   ? ? ?No past surgical history on file. ? ? reports that he has quit smoking. His smoking use included cigarettes. He has never used smokeless tobacco. He reports that he does not currently use alcohol. He reports that he does not use  drugs. ? ?Allergies  ?Allergen Reactions  ? Haldol [Haloperidol] Other (See Comments)  ?  SI  ? Abilify [Aripiprazole] Palpitations  ? Demerol [Meperidine Hcl] Hives  ? ? ?No family history on file. ? ?Prior to Admission medications   ?Medication Sig Start Date End Date Taking? Authorizing Provider  ?albuterol (VENTOLIN HFA) 108 (90 Base) MCG/ACT inhaler Inhale 2 puffs into the lungs every 6 (six) hours as needed for wheezing. 07/12/21   [provider]  ?allopurinol (ZYLOPRIM) 100 MG tablet Take 100 mg by mouth daily. 06/01/21   [provider]  ?atorvastatin (LIPITOR) 80 MG tablet Take 80 mg by mouth daily. 05/11/21   [provider]  ?baclofen (LIORESAL) 10 MG tablet Take 1 tablet (10 mg total) by mouth 2 (two) times daily as needed for muscle spasms. Home med. 08/16/21   Darlin PriestlyLai, Tina, MD  ?cyclobenzaprine (FLEXERIL) 5 MG tablet Take 1 tablet (5 mg total) by mouth 3 (three) times daily as needed for muscle spasms. Home med. 08/16/21   Darlin PriestlyLai, Tina, MD  ?diltiazem (CARDIZEM CD) 360 MG 24 hr capsule Take 1 capsule (360 mg total) by mouth daily. 08/16/21 11/14/21  Darlin PriestlyLai, Tina, MD  ?divalproex (DEPAKOTE) 500 MG DR tablet Take 1,000-1,500 mg by mouth 2 (two) times daily. 1000 mg every morning and 1500 mg at bedtime 07/21/21   [provider]  ?ELIQUIS 5 MG TABS tablet Take 5 mg by mouth 2 (  two) times daily. 06/16/21   [provider]  ?escitalopram (LEXAPRO) 10 MG tablet Take 10 mg by mouth daily. 06/16/21   [provider]  ?fluticasone (FLONASE) 50 MCG/ACT nasal spray Place 2 sprays into both nostrils 2 (two) times daily. 06/16/21   [provider]  ?INVEGA 9 MG 24 hr tablet Take 9 mg by mouth every morning. 07/21/21   [provider]  ?loratadine (CLARITIN) 10 MG tablet Take 10 mg by mouth daily. 06/16/21   [provider]  ?melatonin 3 MG TABS tablet Take 3 mg by mouth at bedtime. 06/08/21   [provider]  ?metoprolol tartrate (LOPRESSOR) 100 MG  tablet Take 1 tablet (100 mg total) by mouth 2 (two) times daily. 08/16/21 11/14/21  Darlin Priestly, MD  ?omeprazole (PRILOSEC) 20 MG capsule Take 20 mg by mouth daily.    [provider]  ?predniSONE (DELTASONE) 10 MG tablet Take 40 mg (4 tablets) from 3/21 to 3/22, then 20 mg (2 tablets) from 3/23 to 3/26, then 10 mg (1 tablet) from 3/27 to 3/30, then done.  For gout. 08/16/21   Darlin Priestly, MD  ?promethazine (PHENERGAN) 25 MG tablet Take 25 mg by mouth every 6 (six) hours as needed for nausea or vomiting.    [provider]  ? ? ?Physical Exam: ?Vitals:  ? 08/27/21 2314 08/28/21 0005 08/28/21 0136 08/28/21 0300  ?BP:  125/66 109/62 (!) 94/53  ?Pulse:  89 82 78  ?Resp:  20 20 20   ?Temp:      ?TempSrc:      ?SpO2: 90% 97% 94% 95%  ? ? ? ?Vitals:  ? 08/27/21 2314 08/28/21 0005 08/28/21 0136 08/28/21 0300  ?BP:  125/66 109/62 (!) 94/53  ?Pulse:  89 82 78  ?Resp:  20 20 20   ?Temp:      ?TempSrc:      ?SpO2: 90% 97% 94% 95%  ?Constitutional: NAD, mildly anxious, comfortable ?Eyes: PERRL, lids and conjunctivae normal ?ENMT: Mucous membranes are moist. Posterior pharynx clear of any exudate or lesions.Normal dentition.  ?Neck: normal, supple, no masses, no thyromegaly ?Respiratory: clear to auscultation bilaterally, no wheezing, no crackles. Normal respiratory effort. No accessory muscle use.  ?Cardiovascular: Regular rate and rhythm, no murmurs / rubs / gallops. No extremity edema. 2+ pedal pulses. No carotid bruits.  ?Abdomen: no tenderness, no masses palpated. No hepatosplenomegaly. Bowel sounds positive.  ?Musculoskeletal: no clubbing / cyanosis. No joint deformity upper and lower extremities. Good ROM, no contractures. Normal muscle tone.  ?Skin: no rashes, lesions, ulcers. No induration ?Neurologic: CN 2-12 grossly intact. Sensation intact,  Strength 5/5 in right,  ?3+/5 on left  ?Psychiatric: Normal judgment and insight. Alert and oriented x 3. Normal mood.  ? ? ?Labs on Admission: I have personally  reviewed following labs and imaging studies ? ?CBC: ?Recent Labs  ?Lab 08/27/21 ?2317  ?WBC 7.6  ?NEUTROABS 4.3  ?HGB 12.9*  ?HCT 39.9  ?MCV 86.9  ?PLT 204  ? ?Basic Metabolic Panel: ?Recent Labs  ?Lab 08/27/21 ?2317  ?NA 137  ?K 3.9  ?CL 99  ?CO2 29  ?GLUCOSE 119*  ?BUN 13  ?CREATININE 1.20  ?CALCIUM 8.9  ? ?GFR: ?Estimated Creatinine Clearance: 157.8 mL/min (by C-G formula based on SCr of 1.2 mg/dL). ?Liver Function Tests: ?Recent Labs  ?Lab 08/27/21 ?2317  ?AST 20  ?ALT 16  ?ALKPHOS 55  ?BILITOT 0.7  ?PROT 6.6  ?ALBUMIN 3.6  ? ?No results for input(s): LIPASE, AMYLASE in the last 168  hours. ?No results for input(s): AMMONIA in the last 168 hours. ?Coagulation Profile: ?No results for input(s): INR, PROTIME in the last 168 hours. ?Cardiac Enzymes: ?No results for input(s): CKTOTAL, CKMB, CKMBINDEX, TROPONINI in the last 168 hours. ?BNP (last 3 results) ?No results for input(s): PROBNP in the last 8760 hours. ?HbA1C: ?No results for input(s): HGBA1C in the last 72 hours. ?CBG: ?No results for input(s): GLUCAP in the last 168 hours. ?Lipid Profile: ?No results for input(s): CHOL, HDL, LDLCALC, TRIG, CHOLHDL, LDLDIRECT in the last 72 hours. ?Thyroid Function Tests: ?No results for input(s): TSH, T4TOTAL, FREET4, T3FREE, THYROIDAB in the last 72 hours. ?Anemia Panel: ?No results for input(s): VITAMINB12, FOLATE, FERRITIN, TIBC, IRON, RETICCTPCT in the last 72 hours. ?Urine analysis: ?   ?Component Value Date/Time  ? COLORURINE YELLOW (A) 08/02/2021 2026  ? APPEARANCEUR CLEAR (A) 08/02/2021 2026  ? APPEARANCEUR Clear 04/29/2013 0838  ? LABSPEC 1.017 08/02/2021 2026  ? LABSPEC 1.017 04/29/2013 0838  ? PHURINE 7.0 08/02/2021 2026  ? GLUCOSEU NEGATIVE 08/02/2021 2026  ? GLUCOSEU Negative 04/29/2013 0838  ? HGBUR NEGATIVE 08/02/2021 2026  ? BILIRUBINUR NEGATIVE 08/02/2021 2026  ? BILIRUBINUR Negative 04/29/2013 0838  ? KETONESUR NEGATIVE 08/02/2021 2026  ? PROTEINUR NEGATIVE 08/02/2021 2026  ? NITRITE NEGATIVE 08/02/2021  2026  ? LEUKOCYTESUR MODERATE (A) 08/02/2021 2026  ? LEUKOCYTESUR Negative 04/29/2013 0838  ? ? ?Radiological Exams on Admission: ?CT Head Wo Contrast ? ?Result Date: 08/27/2021 ?CLINICAL DATA:  Head trauma, intracranial

## 2021-08-28 NOTE — ED Notes (Signed)
Pt resting at this time, normal rise and fall of chest. Pt does be come hypoxic when sleeping, pt on 2L/min via [place] this RN titrated O2 to 4L/min while sleeping.  ?

## 2021-08-28 NOTE — Assessment & Plan Note (Signed)
BMI 46.80

## 2021-08-28 NOTE — ED Notes (Signed)
Pt at CT

## 2021-08-28 NOTE — Assessment & Plan Note (Signed)
Uric acid elevated at 10.  On low-dose allopurinol.  Hopefully can elevate the dose of the allopurinol soon. ?

## 2021-08-28 NOTE — Progress Notes (Signed)
Pt is refusing Lab work. ?

## 2021-08-28 NOTE — Progress Notes (Signed)
?  Progress Note ? ? ?Patient: Daniel [name] JOA:[id number] DOB: [birth year] DOA: 08/27/2021     0 ?DOS: the patient was seen and examined on 08/28/2021 ?  ? ?Assessment and Plan: ?* Syncope ?With blood pressure being on the lower side, we need to check orthostatics.  Continue fluids for now.  Patient did get lightheaded before he fell down. ? ?Left-sided weakness ?MRI of the brain negative.  Patient stated that he does have some back issues and potentially needed an operation on his back.  Patient also states that he has a left shoulder rotator cuff tear. ? ?Atrial fibrillation, chronic (HCC) ?Holding Eliquis with head trauma.  Restarted lower dose Cardizem CD.  Will hopefully be able to increase the dose but I want to check orthostatics first. ? ?Schizoaffective disorder, bipolar type (HCC) ?Continue Depakote, Invega ? ?History of gout ?Uric acid elevated at 10.  On low-dose allopurinol.  Hopefully can elevate the dose of the allopurinol soon. ? ?Obesity, Class III, BMI 40-49.9 (morbid obesity) (HCC) ?BMI 46.80 ? ? ? ? ?  ? ?Subjective: Patient stated he felt dizzy and lightheaded and hit his head.  He had a brief loss of consciousness.  He feels weak and lethargic.  He states that he may have to have a back surgery and also has a rotator cuff tear on the left. ? ?Physical Exam: ?Vitals:  ? 08/28/21 0136 08/28/21 0300 08/28/21 0710 08/28/21 1000  ?BP: 109/62 (!) 94/53 118/74 119/67  ?Pulse: 82 78 92 87  ?Resp: 20 20 17 16   ?Temp:      ?TempSrc:      ?SpO2: 94% 95% 95% 95%  ? ?Physical Exam ?HENT:  ?   Head: Normocephalic.  ?   Mouth/Throat:  ?   Pharynx: No oropharyngeal exudate.  ?Eyes:  ?   General: Lids are normal.  ?   Conjunctiva/sclera: Conjunctivae normal.  ?Cardiovascular:  ?   Rate and Rhythm: Normal rate and regular rhythm.  ?   Heart sounds: Normal heart sounds, S1 normal and S2 normal.  ?Pulmonary:  ?   Breath sounds: No decreased breath sounds, wheezing, rhonchi or rales.  ?Abdominal:  ?   Palpations:  Abdomen is soft.  ?   Tenderness: There is no abdominal tenderness.  ?Musculoskeletal:  ?   Right lower leg: No swelling.  ?   Left lower leg: No swelling.  ?Skin: ?   General: Skin is warm.  ?   Findings: No rash.  ?Neurological:  ?   Mental Status: He is alert and oriented to person, place, and time.  ?   Comments: Difficulty picking up left shoulder but able to have good grip strength.  Able to straight leg raise with the left leg but more difficult than with the right.  ?  ?Data Reviewed: ?Repeat CT scan of the head negative for bleed.  MRI of the brain negative for stroke.  Uric acid 10, hemoglobin 12.9, creatinine 1.2 ? ? ?Disposition: ?Status is: Observation ?The patient remains OBS appropriate and will d/c before 2 midnights. ?With head trauma on Eliquis we will watch neuro status overnight again and reassess tomorrow.  Holding Eliquis at this time.  Checking orthostatics. ? ?Planned Discharge Destination: His ALF ? ?Author: ? , MD ?08/28/2021 12:11 PM ? ?For on call review [email or web address].  ?

## 2021-08-28 NOTE — Progress Notes (Signed)
Code stroke activated at [place]. Dr. Karma Greaser at bedside. States pt had an acute neuro change at 0000-new onset left sided weakness and numbness, slurred speech. States pt has already had head CT. Dr. Reesa Chew on screen at 0014 to evaluate pt. Pt back to CT at 0025 . ?

## 2021-08-28 NOTE — ED Notes (Addendum)
Pt suddenly having slurred speech at this time. Pt unable to squeeze nurses fingers. Pt complains of left sided numbness and tingling MD York Cerise brought to bedside at this time. Last seen normal 0000 08/27/21 ?

## 2021-08-28 NOTE — ED Notes (Signed)
RT called for cpap 

## 2021-08-28 NOTE — ED Notes (Signed)
RN aware bed assigned ?

## 2021-08-28 NOTE — Progress Notes (Signed)
SLP Cancellation Note ? ?Patient Details ?Name: Daniel [name] ?MRN: [id number] ?DOB: Sep 25, 1990 ? ? ?Cancelled treatment:       Reason Eval/Treat Not Completed: SLP screened, no needs identified, will sign off ? ?All concerns have resolved.  ? ?Spencer Peterkin B. Dreama Saa, M.S., CCC-SLP, CBIS ?Speech-Language Pathologist ?Rehabilitation Services ?Office [phone] ? ?Daniel [name] ?08/28/2021, 2:47 PM ?

## 2021-08-28 NOTE — Assessment & Plan Note (Signed)
MRI of the brain negative.  Patient stated that he does have some back issues and potentially needed an operation on his back.  Patient also states that he has a left shoulder rotator cuff tear. ?

## 2021-08-28 NOTE — Evaluation (Signed)
Physical Therapy Evaluation ?Patient Details ?Name: Daniel [name] ?MRN: [id number] ?DOB: 07/12/1990 ?Today's Date: 08/28/2021 ? ?History of Present Illness ? 33 yea rold made presenting to ED with mech fall with head trauma. On evaluation in ED patient was noted to have CT head  that showed scalp hematoma, but no acute intracranial injury. However,s/p CT patient noted acute onset of left sided extremity weakness  and slurred speech at 12 midnight and code stroke was called. CTA  head neck noted no LVO.  AT that time neurology recommended hold all anticoagulation and repeating non-contrast CT in addition to full stroke work up per protocol. Patient on evaluation noted that  still felt weak but noted  mild improvement in his feet and had but legs and arm still feel very weak.  He also endorses  halo in his vision with bright light in the room. ?  ?Clinical Impression ? Pt is resting in bed upon PT entrance to room for evaluation today. Pt is A&Ox4 and reports c/o pain in L UE and L knee, but does not provide a quantitative pain value. PLOF and home-set up obtained from OT evaluation note per pt chart.  ? ?Pt is able to complete bed mobility w/ modI to sit EOB. Once seated EOB, Pt is able to progress to standing w/ SUPERVISION using RW. He is able to ambulate ~39ft w/ CGA using RW, reports c/o fatigue and requests to return to bed. Pt left in bed w/ all needs w/in reach prior to PT exiting room. Pt will benefit from continued skilled PT in order to improve LE strength/mobility/gait, and restore PLOF. Current discharge recommendation to HHPT is appropriate due to the level of assistance required by the patient to ensure safety and improve overall function. ?   ?   ? ?Recommendations for follow up therapy are one component of a multi-disciplinary discharge planning process, led by the attending physician.  Recommendations may be updated based on patient status, additional functional criteria and insurance  authorization. ? ?Follow Up Recommendations Home health PT ? ?  ?Assistance Recommended at Discharge Intermittent Supervision/Assistance  ?Patient can return home with the following ? A little help with walking and/or transfers;A little help with bathing/dressing/bathroom;Assistance with cooking/housework;Assist for transportation;Help with stairs or ramp for entrance ? ?  ?Equipment Recommendations Rolling walker (2 wheels)  ?Recommendations for Other Services ?    ?  ?Functional Status Assessment Patient has had a recent decline in their functional status and demonstrates the ability to make significant improvements in function in a reasonable and predictable amount of time.  ? ?  ?Precautions / Restrictions Precautions ?Precautions: Fall ?Precaution Comments: watch HR ?Restrictions ?Weight Bearing Restrictions: No  ? ?  ? ?Mobility ? Bed Mobility ?Overal bed mobility: Modified Independent ?  ?  ?  ?  ?  ?  ?  ?  ? ?Transfers ?Overall transfer level: Needs assistance ?  ?Transfers: Sit to/from Stand ?Sit to Stand: Supervision ?  ?  ?  ?  ?  ?  ?  ? ?Ambulation/Gait ?Ambulation/Gait assistance: Min guard ?Gait Distance (Feet): 10 Feet ?Assistive device: Rolling walker (2 wheels) ?Gait Pattern/deviations: Step-to pattern, Decreased step length - right, Decreased step length - left, Decreased stride length ?Gait velocity: decreased ?  ?  ?  ? ?Stairs ?  ?  ?  ?  ?  ? ?Wheelchair Mobility ?  ? ?Modified Rankin (Stroke Patients Only) ?  ? ?  ? ?Balance Overall balance assessment: Needs assistance ?Sitting-balance support:  Feet supported ?Sitting balance-Leahy Scale: Good ?  ?  ?Standing balance support: Bilateral upper extremity supported, Reliant on assistive device for balance, During functional activity ?Standing balance-Leahy Scale: Good ?  ?  ?  ?  ?  ?  ?  ?  ?  ?  ?  ?  ?   ? ? ? ?Pertinent Vitals/Pain Pain Assessment ?Pain Assessment: Faces ?Faces Pain Scale: Hurts a little bit ?Pain Location: R UE with  movement ?Pain Descriptors / Indicators: Discomfort ?Pain Intervention(s): Limited activity within patient's tolerance, Monitored during session  ? ? ?Home Living Family/patient expects to be discharged to:: Other (Comment) (pt lives in an AFL with a family) ?Living Arrangements: Non-relatives/Friends;Group Home ?Available Help at Discharge: Available 24 hours/day ?Type of Home: House ?  ?  ?  ?  ?Home Layout: One level ?Home Equipment: Kasandra Knudsen - single point ?   ?  ?Prior Function Prior Level of Function : Independent/Modified Independent ?  ?  ?  ?  ?  ?  ?Mobility Comments: pt reports SPC use PTA ?ADLs Comments: pt reports MOD I-I in all ADL/IADL PTA ?  ? ? ?Hand Dominance  ?   ? ?  ?Extremity/Trunk Assessment  ? Upper Extremity Assessment ?Upper Extremity Assessment: Overall WFL for tasks assessed ?LUE Deficits / Details: pain with LUE PROM; pt reports he has a rotator cuff tear ?LUE: Shoulder pain with ROM ?LUE Sensation: WNL ?  ? ?Lower Extremity Assessment ?Lower Extremity Assessment: Overall WFL for tasks assessed;Generalized weakness ?LLE Sensation: decreased light touch ?  ? ?   ?Communication  ? Communication: No difficulties  ?Cognition   ?  ?  ?  ?  ?  ?  ?  ?  ?  ?  ?  ?  ?  ?  ?  ?  ?  ?  ?  ?  ?  ? ?  ?General Comments General comments (skin integrity, edema, etc.): pt with HR up to 170s with sustained standing during urinating as therapist entered room; HR down to 115 when seated at edge of bed ? ?  ?Exercises    ? ?Assessment/Plan  ?  ?PT Assessment Patient needs continued PT services  ?PT Problem List Decreased strength;Decreased cognition;Decreased range of motion;Decreased knowledge of use of DME;Decreased activity tolerance;Decreased safety awareness;Decreased balance;Decreased knowledge of precautions;Decreased mobility;Decreased coordination;Cardiopulmonary status limiting activity ? ?   ?  ?PT Treatment Interventions DME instruction;Balance training;Neuromuscular re-education;Gait  training;Cognitive remediation;Stair training;Functional mobility training;Patient/family education;Therapeutic activities;Therapeutic exercise;Manual techniques   ? ?PT Goals (Current goals can be found in the Care Plan section)  ?Acute Rehab PT Goals ?Patient Stated Goal: to get better to go home ?PT Goal Formulation: With patient ?Time For Goal Achievement: 09/11/21 ?Potential to Achieve Goals: Good ? ?  ?Frequency Min 2X/week ?  ? ? ?Co-evaluation   ?  ?  ?  ?  ? ? ?  ?AM-PAC PT "6 Clicks" Mobility  ?Outcome Measure Help needed turning from your back to your side while in a flat bed without using bedrails?: None ?Help needed moving from lying on your back to sitting on the side of a flat bed without using bedrails?: None ?Help needed moving to and from a bed to a chair (including a wheelchair)?: None ?Help needed standing up from a chair using your arms (e.g., wheelchair or bedside chair)?: None ?Help needed to walk in hospital room?: A Little ?Help needed climbing 3-5 steps with a railing? : A Lot ?6 Click Score: 21 ? ?  ?  End of Session   ?Activity Tolerance: Patient tolerated treatment well;Patient limited by fatigue;Patient limited by lethargy ?Patient left: in bed;with call bell/phone within reach ?Nurse Communication: Mobility status ?PT Visit Diagnosis: Unsteadiness on feet (R26.81);Repeated falls (R29.6);Pain;Muscle weakness (generalized) (M62.81) ?Pain - Right/Left: Left ?Pain - part of body: Arm;Knee;Leg ?  ? ?Time: [id number] ?PT Time Calculation (min) (ACUTE ONLY): 16 min ? ? ?Charges:     ?  ?  ?   ? ?Jonnie Kind, SPT ?08/28/2021, 3:27 PM ? ?

## 2021-08-29 DIAGNOSIS — R55 Syncope and collapse: Secondary | ICD-10-CM | POA: Diagnosis not present

## 2021-08-29 DIAGNOSIS — F449 Dissociative and conversion disorder, unspecified: Secondary | ICD-10-CM

## 2021-08-29 DIAGNOSIS — Z8739 Personal history of other diseases of the musculoskeletal system and connective tissue: Secondary | ICD-10-CM | POA: Diagnosis not present

## 2021-08-29 DIAGNOSIS — I482 Chronic atrial fibrillation, unspecified: Secondary | ICD-10-CM | POA: Diagnosis not present

## 2021-08-29 DIAGNOSIS — F250 Schizoaffective disorder, bipolar type: Secondary | ICD-10-CM | POA: Diagnosis not present

## 2021-08-29 LAB — LIPID PANEL
Cholesterol: 133 mg/dL (ref 0–200)
HDL: 32 mg/dL — ABNORMAL LOW (ref >40)
LDL Cholesterol: 50 mg/dL (ref 0–99)
Total CHOL/HDL Ratio: 4.2 RATIO
Triglycerides: 255 mg/dL — ABNORMAL HIGH (ref <150)
VLDL: 51 mg/dL — ABNORMAL HIGH (ref 0–40)

## 2021-08-29 LAB — URINALYSIS, ROUTINE W REFLEX MICROSCOPIC
Bacteria, UA: NONE SEEN
Bilirubin Urine: NEGATIVE
Glucose, UA: NEGATIVE mg/dL
Hgb urine dipstick: NEGATIVE
Ketones, ur: NEGATIVE mg/dL
Nitrite: NEGATIVE
Protein, ur: NEGATIVE mg/dL
Specific Gravity, Urine: 1.043 — ABNORMAL HIGH (ref 1.005–1.030)
pH: 5 (ref 5.0–8.0)

## 2021-08-29 LAB — HEMOGLOBIN A1C
Hgb A1c MFr Bld: 6.1 % — ABNORMAL HIGH (ref 4.8–5.6)
Mean Plasma Glucose: 128.37 mg/dL

## 2021-08-29 MED ORDER — DILTIAZEM HCL ER COATED BEADS 360 MG PO CP24: 360.0000 mg | ORAL_CAPSULE | Freq: Every day | ORAL | 0 refills | Status: DC

## 2021-08-29 MED ORDER — ALLOPURINOL 300 MG PO TABS: 300.0000 mg | ORAL_TABLET | Freq: Every day | ORAL | 0 refills | Status: DC

## 2021-08-29 MED ORDER — METOPROLOL TARTRATE 50 MG PO TABS: 50.0000 mg | ORAL_TABLET | Freq: Two times a day (BID) | ORAL | 0 refills | Status: DC

## 2021-08-29 NOTE — Discharge Summary (Signed)
?Physician Discharge Summary ?  ?Patient: Daniel [name] MRN: [id number] DOB: Feb 20, 1991  ?Admit date:     08/27/2021  ?Discharge date: 08/29/21  ?Discharge Physician: Loletha Grayer  ? ?PCP: Lauretta Grill, NP  ? ?Recommendations at discharge:  ? ?Follow-up with PCP in 5 days ?Called the patient's guardian [name] at [phone] prior to sending the patient into the hospital.  Please also call prior to doing any testing in the hospital. ? ?Discharge Diagnoses: ?Principal Problem: ?  Syncope ?Active Problems: ?  Left-sided weakness ?  Atrial fibrillation, chronic ([place]) ?  Schizoaffective disorder, bipolar type ([place]) ?  Obesity, Class III, BMI 40-49.9 (morbid obesity) ([place]) ?  History of gout ? ? ? ?Hospital Course: ?Patient was brought in to the hospital for an observation on 08/28/2021 and discharged 08/29/2021.  Patient came in with left-sided weakness.  A code stroke was called.  Telemetry neurology recommended a repeat CT scan of the head and then MRI.  The patient had an episode of falling and he stated he hit his head.  Complains of left-sided weakness.  MRI of the brain was negative.  Repeat CAT scan did not show any bleeding.  Our neurologist here saw the patient and recommended no further testing. ? 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  She would like to be notified anytime the patient is going to be sent into the hospital. ? ?Assessment and Plan: ?* Syncope ?With blood pressure being on the lower side.  Patient did get lightheaded before he fell down.  Patient is not orthostatic.  I did cut back on the metoprolol to 50 mg twice a day ? ?Left-sided weakness ?MRI of the brain negative.  Seen by her neurologist here and cleared to go home ? ?Atrial fibrillation, chronic ([place]) ?Restarted Eliquis.  On Cardizem CD 360 mg daily and decreased metoprolol to 50 mg twice a day ? ?Schizoaffective disorder, bipolar type ([place]) ?Continue Depakote,  Invega ? ?History of gout ?Uric acid elevated at 10.  On low-dose allopurinol.  Increase allopurinol to 300 mg daily ? ?Obesity, Class III, BMI 40-49.9 (morbid obesity) ([place]) ?BMI 46.80 ? ?Conversion disorder.  Overutilization of medical care.  Please contact guardian prior to any testing or prior to sending into the hospital. ? ?Recommend outpatient sleep study ? ? ? ? ?  ? ? ?Consultants: Neurology ?Procedures performed: None ?Disposition: Home ?Diet recommendation:  ?Regular diet ?DISCHARGE MEDICATION: ?Allergies as of 08/29/2021   ? ?   Reactions  ? Haldol [haloperidol] Other (See Comments)  ? SI  ? Abilify [aripiprazole] Palpitations  ? Demerol [meperidine Hcl] Hives  ? ?  ? ?  ?Medication List  ?  ? ?STOP taking these medications   ? ?cyclobenzaprine 5 MG tablet ?Commonly known as: FLEXERIL ?  ?diltiazem 90 MG tablet ?Commonly known as: CARDIZEM ?  ?hydrochlorothiazide 25 MG tablet ?Commonly known as: HYDRODIURIL ?  ?hydrOXYzine 50 MG capsule ?Commonly known as: VISTARIL ?  ?predniSONE 10 MG tablet ?Commonly known as: DELTASONE ?  ?promethazine 25 MG tablet ?Commonly known as: PHENERGAN ?  ? ?  ? ?TAKE these medications   ? ?albuterol 108 (90 Base) MCG/ACT inhaler ?Commonly known as: VENTOLIN HFA ?Inhale 2 puffs into the lungs every 6 (six) hours as needed for wheezing. ?  ?allopurinol 300 MG tablet ?Commonly known as: ZYLOPRIM ?Take 1 tablet (300 mg total) by mouth daily. ?What changed:  ?medication strength ?how much to take ?  ?atorvastatin 80  MG tablet ?Commonly known as: LIPITOR ?Take 80 mg by mouth daily. ?  ?baclofen 10 MG tablet ?Commonly known as: LIORESAL ?Take 1 tablet (10 mg total) by mouth 2 (two) times daily as needed for muscle spasms. Home med. ?  ?diltiazem 360 MG 24 hr capsule ?Commonly known as: CARDIZEM CD ?Take 1 capsule (360 mg total) by mouth daily. ?What changed: Another medication with the same name was removed. Continue taking this medication, and follow the directions you see here. ?   ?divalproex 500 MG DR tablet ?Commonly known as: DEPAKOTE ?Take 1,000-1,500 mg by mouth 2 (two) times daily. 1000 mg every morning and 1500 mg at bedtime ?  ?Eliquis 5 MG Tabs tablet ?Generic drug: apixaban ?Take 5 mg by mouth 2 (two) times daily. ?  ?escitalopram 10 MG tablet ?Commonly known as: LEXAPRO ?Take 10 mg by mouth daily. ?  ?fluticasone 50 MCG/ACT nasal spray ?Commonly known as: FLONASE ?Place 2 sprays into both nostrils 2 (two) times daily. ?  ?Invega 9 MG 24 hr tablet ?Generic drug: paliperidone ?Take 9 mg by mouth every morning. ?  ?loratadine 10 MG tablet ?Commonly known as: CLARITIN ?Take 10 mg by mouth daily. ?  ?melatonin 3 MG Tabs tablet ?Take 3 mg by mouth at bedtime. ?  ?metoprolol tartrate 50 MG tablet ?Commonly known as: LOPRESSOR ?Take 1 tablet (50 mg total) by mouth 2 (two) times daily. ?What changed:  ?medication strength ?how much to take ?  ?omeprazole 20 MG capsule ?Commonly known as: PRILOSEC ?Take 20 mg by mouth daily. ?  ? ?  ? ? Follow-up Information   ? ? Lauretta Grill, NP Follow up in 5 day(s).   ?Specialty: Nurse Practitioner ?Contact information: ?[address] [address] ?[place] [address] ?[phone] ? ? ?  ?  ? ?  ?  ? ?  ? ?Discharge Exam: Blood pressure 129/75, pulse 96, respirations 16, last temperature 98.4 ?Physical Exam ?HENT:  ?   Head: Normocephalic.  ?   Mouth/Throat:  ?   Pharynx: No oropharyngeal exudate.  ?Eyes:  ?   General: Lids are normal.  ?   Conjunctiva/sclera: Conjunctivae normal.  ?Cardiovascular:  ?   Rate and Rhythm: Normal rate and regular rhythm.  ?   Heart sounds: Normal heart sounds, S1 normal and S2 normal.  ?Pulmonary:  ?   Breath sounds: No decreased breath sounds, wheezing, rhonchi or rales.  ?Abdominal:  ?   Palpations: Abdomen is soft.  ?   Tenderness: There is no abdominal tenderness.  ?Musculoskeletal:  ?   Right lower leg: No swelling.  ?   Left lower leg: No swelling.  ?Skin: ?   General: Skin is warm.  ?   Findings: No rash.  ?Neurological:  ?    Mental Status: He is alert and oriented to person, place, and time.  ?   Comments: Difficulty picking up left shoulder but able to have good grip strength.  Able to straight leg raise with the left leg but more difficult than with the right.  ?  ? ?Condition at discharge: stable ? ?The results of significant diagnostics from this hospitalization (including imaging, microbiology, ancillary and laboratory) are listed below for reference.  ? ?Imaging Studies: ?CT ANGIO HEAD NECK W WO CM ? ?Result Date: 08/02/2021 ?CLINICAL DATA:  Initial evaluation for neuro deficit, stroke suspected. EXAM: CT ANGIOGRAPHY HEAD AND NECK TECHNIQUE: Multidetector CT imaging of the head and neck was performed using the standard protocol during bolus administration of intravenous contrast.  Multiplanar CT image reconstructions and MIPs were obtained to evaluate the vascular anatomy. Carotid stenosis measurements (when applicable) are obtained utilizing NASCET criteria, using the distal internal carotid diameter as the denominator. RADIATION DOSE REDUCTION: This exam was performed according to the departmental dose-optimization program which includes automated exposure control, adjustment of the mA and/or kV according to patient size and/or use of iterative reconstruction technique. CONTRAST:  171mL OMNIPAQUE IOHEXOL 350 MG/ML SOLN COMPARISON:  None. FINDINGS: CT HEAD FINDINGS Brain: Cerebral volume within normal limits for patient age. No evidence for acute intracranial hemorrhage. No findings to suggest acute large vessel territory infarct. No mass lesion, midline shift, or mass effect. Ventricles are normal in size without evidence for hydrocephalus. No extra-axial fluid collection identified. Vascular: No hyperdense vessel identified. Skull: Scalp soft tissues demonstrate no acute abnormality. Calvarium intact. Sinuses/Orbits: Globes and orbital soft tissues within normal limits. Visualized paranasal sinuses are clear. No mastoid effusion.  CTA NECK FINDINGS Aortic arch: Visualized aortic arch normal caliber with normal branch pattern. No stenosis about the origin of the great vessels. Right carotid system: Right common and internal carotid art

## 2021-08-29 NOTE — [place] FL2 (Signed)
?[place] MEDICAID FL2 LEVEL OF CARE SCREENING TOOL  ?  ? ?IDENTIFICATION  ?Patient Name: ?Daniel [name] Birthdate: May 01, 1991 Sex: male Admission Date (Current Location): ?08/27/2021  ?[place] Number: ? [place] ?  Facility and Address:  ?[facility], [address] [place], [place] [address] ?     Provider Number: ?[id number]  ?Attending Physician Name and Address:  ?Alford Highland, MD ? Relative Name and Phone Number:  ?Florencia Reasons Doctor, hospital Guardian)   [phone] (Work Phone) ?   ?Current Level of Care: ?Hospital Recommended Level of Care: ?Other (Comment) (group home) Prior Approval Number: ?  ? ?Date Approved/Denied: ?  PASRR Number: ?  ? ?Discharge Plan: ?  ?  ? ?Current Diagnoses: ?Patient Active Problem List  ? Diagnosis Date Noted  ? Conversion disorder   ? Syncope 08/28/2021  ? History of gout 08/28/2021  ? Rapid atrial fibrillation (HCC) 08/14/2021  ? Schizoaffective disorder, bipolar type (HCC) 08/14/2021  ? Chronic hypoxemic respiratory failure (HCC) 08/14/2021  ? OSA (obstructive sleep apnea)   ? Diabetes mellitus type 2, uncomplicated (HCC)   ? Gout flare   ? Left-sided weakness 08/02/2021  ? Obesity, Class III, BMI 40-49.9 (morbid obesity) (HCC) 08/02/2021  ? Chronic atrial fibrillation (HCC) 08/02/2021  ? Current use of long term anticoagulation 08/02/2021  ? ? ?Orientation RESPIRATION BLADDER Height & Weight   ?  ?Self, Time, Situation, Place ? Other (Comment) (bipap/cpap) Continent Weight:   ?Height:     ?BEHAVIORAL SYMPTOMS/MOOD NEUROLOGICAL BOWEL NUTRITION STATUS  ?    Continent Diet (heart diet, thin liquids)  ?AMBULATORY STATUS COMMUNICATION OF NEEDS Skin   ?  Verbally Bruising ?  ?  ?  ?    ?     ?     ? ? ?Personal Care Assistance Level of Assistance  ?    ?  ?  ?   ? ?Functional Limitations Info  ?    ?  ?   ? ? ?SPECIAL CARE FACTORS FREQUENCY  ?    ?  ?  ?  ?  ?  ?  ?   ? ? ?Contractures    ? ? ?Additional Factors Info  ?Code Status,  Allergies Code Status Info: full ?Allergies Info: haldol, abilify, demerol ?  ?  ?  ?   ? ?Current Medications (08/29/2021):   ?STOP taking these medications   ?  ?cyclobenzaprine 5 MG tablet ?Commonly known as: FLEXERIL ?   ?diltiazem 90 MG tablet ?Commonly known as: CARDIZEM ?   ?hydrochlorothiazide 25 MG tablet ?Commonly known as: HYDRODIURIL ?   ?hydrOXYzine 50 MG capsule ?Commonly known as: VISTARIL ?   ?predniSONE 10 MG tablet ?Commonly known as: DELTASONE ?   ?promethazine 25 MG tablet ?Commonly known as: PHENERGAN ?   ?  ?   ?  ?TAKE these medications   ?  ?albuterol 108 (90 Base) MCG/ACT inhaler ?Commonly known as: VENTOLIN HFA ?Inhale 2 puffs into the lungs every 6 (six) hours as needed for wheezing. ?   ?allopurinol 300 MG tablet ?Commonly known as: ZYLOPRIM ?Take 1 tablet (300 mg total) by mouth daily. ?What changed:  ?medication strength ?how much to take ?   ?atorvastatin 80 MG tablet ?Commonly known as: LIPITOR ?Take 80 mg by mouth daily. ?   ?baclofen 10 MG tablet ?Commonly known as: LIORESAL ?Take 1 tablet (10 mg total) by mouth 2 (two) times daily as needed for muscle spasms. Home med. ?   ?diltiazem 360 MG  24 hr capsule ?Commonly known as: CARDIZEM CD ?Take 1 capsule (360 mg total) by mouth daily. ?What changed: Another medication with the same name was removed. Continue taking this medication, and follow the directions you see here. ?   ?divalproex 500 MG DR tablet ?Commonly known as: DEPAKOTE ?Take 1,000-1,500 mg by mouth 2 (two) times daily. 1000 mg every morning and 1500 mg at bedtime ?   ?Eliquis 5 MG Tabs tablet ?Generic drug: apixaban ?Take 5 mg by mouth 2 (two) times daily. ?   ?escitalopram 10 MG tablet ?Commonly known as: LEXAPRO ?Take 10 mg by mouth daily. ?   ?fluticasone 50 MCG/ACT nasal spray ?Commonly known as: FLONASE ?Place 2 sprays into both nostrils 2 (two) times daily. ?   ?Invega 9 MG 24 hr tablet ?Generic drug: paliperidone ?Take 9 mg by mouth every morning. ?   ?loratadine 10  MG tablet ?Commonly known as: CLARITIN ?Take 10 mg by mouth daily. ?   ?melatonin 3 MG Tabs tablet ?Take 3 mg by mouth at bedtime. ?   ?metoprolol tartrate 50 MG tablet ?Commonly known as: LOPRESSOR ?Take 1 tablet (50 mg total) by mouth 2 (two) times daily. ?What changed:  ?medication strength ?how much to take ?   ?omeprazole 20 MG capsule ?Commonly known as: PRILOSEC ?Take 20 mg by mouth daily. ?   ? ? ?Relevant Imaging Results: ? ?Relevant Lab Results: ? ? ?Additional Information ?  ? ?Daniel [name] E Rayshawn Maney, LCSW ? ? ? ? ?

## 2021-08-29 NOTE — Discharge Instructions (Signed)
Recommend overnight sleep study ?

## 2021-08-29 NOTE — Plan of Care (Signed)
°  Problem: Education: °Goal: Knowledge of General Education information will improve °Description: Including pain rating scale, medication(s)/side effects and non-pharmacologic comfort measures °Outcome: Adequate for Discharge °  °Problem: Health Behavior/Discharge Planning: °Goal: Ability to manage health-related needs will improve °Outcome: Adequate for Discharge °  °Problem: Clinical Measurements: °Goal: Ability to maintain clinical measurements within normal limits will improve °Outcome: Adequate for Discharge °Goal: Will remain free from infection °Outcome: Adequate for Discharge °Goal: Diagnostic test results will improve °Outcome: Adequate for Discharge °Goal: Respiratory complications will improve °Outcome: Adequate for Discharge °Goal: Cardiovascular complication will be avoided °Outcome: Adequate for Discharge °  °Problem: Activity: °Goal: Risk for activity intolerance will decrease °Outcome: Adequate for Discharge °  °Problem: Nutrition: °Goal: Adequate nutrition will be maintained °Outcome: Adequate for Discharge °  °Problem: Coping: °Goal: Level of anxiety will decrease °Outcome: Adequate for Discharge °  °Problem: Elimination: °Goal: Will not experience complications related to bowel motility °Outcome: Adequate for Discharge °Goal: Will not experience complications related to urinary retention °Outcome: Adequate for Discharge °  °Problem: Pain Managment: °Goal: General experience of comfort will improve °Outcome: Adequate for Discharge °  °Problem: Safety: °Goal: Ability to remain free from injury will improve °Outcome: Adequate for Discharge °  °Problem: Skin Integrity: °Goal: Risk for impaired skin integrity will decrease °Outcome: Adequate for Discharge °  °Problem: Education: °Goal: Knowledge of secondary prevention will improve ([place]) °Outcome: Adequate for Discharge °  °

## 2021-08-29 NOTE — Progress Notes (Signed)
MD order received in [facility] to discharge pt home today; TOC previously arranged discharge with Kevin Fenton at [facility] to pick pt up at 1800 today; paperwork placed in discharge envelope; AVS printed by this writer and also placed in envelope for pt discharge; the pt verbally informed staff that Kevin Fenton called him in the room and advised him it would be 1915 before he could pick him up ?

## 2021-08-29 NOTE — TOC Initial Note (Addendum)
Transition of Care (TOC) - Initial/Assessment Note  ? ? ?Patient Details  ?Name: Daniel [name] ?MRN: [id number] ?Date of Birth: November 15, 1990 ? ?Transition of Care (TOC) CM/SW Contact:    ?Tritia Endo E Areta Terwilliger, LCSW ?Phone Number: ?08/29/2021, 10:24 AM ? ?Clinical Narrative:        Patient to DC back to group home today. ?Spoke to patient's Legal [place] via phone. [name] stated patient does not need HH. Elizabeth requested hospital staff call her at both #'s (added to chart) any time patient comes to the hospital. She requested CSW note that patient has conversion disorder. Added to FYIs per Guardian's request.  ? ?Mare Loan with [facility] at [phone]. He stated Jenny Reichmann [facility]) had a family emergency. Awanda Mink is out of town but can pick patient up at 6pm today to take him back to the group home. Awanda Mink stated the pharmacy is [facility] [phone]). Awanda Mink requested copy of DC Summary and FL2 with DC meds be in patient's packet. CSW completed and placed in packet.  ? ?RN and MD notified.  ? ? ?Expected Discharge Plan: Group Home ?Barriers to Discharge: Barriers Resolved ? ? ?Patient Goals and CMS Choice ?Patient states their goals for this hospitalization and ongoing recovery are:: return to group home ?CMS Medicare.gov Compare Post Acute Care list provided to:: Legal Guardian ?Choice offered to / list presented to : [facility] POA / Guardian ? ?Expected Discharge Plan and Services ?Expected Discharge Plan: Group Home ?  ?  ?  ?Living arrangements for the past 2 months: Group Home ?Expected Discharge Date: 08/29/21               ?  ?  ?  ?  ?  ?  ?  ?  ?  ?  ? ?Prior Living Arrangements/Services ?Living arrangements for the past 2 months: Group Home ?Lives with:: Facility Resident ?Patient language and need for interpreter reviewed:: Yes ?Do you feel safe going back to the place where you live?: Yes      ?Need for Family Participation in Patient Care: Yes  (Comment) ?Care giver support system in place?: Yes (comment) ?  ?Criminal Activity/Legal Involvement Pertinent to Current Situation/Hospitalization: No - Comment as needed ? ?Activities of Daily Living ?  ?ADL Screening (condition at time of admission) ?Patient's cognitive ability adequate to safely complete daily activities?: Yes ?Is the patient deaf or have difficulty hearing?: No ?Does the patient have difficulty seeing, even when wearing glasses/contacts?: No ?Does the patient have difficulty concentrating, remembering, or making decisions?: No ?Patient able to express need for assistance with ADLs?: Yes ?Does the patient have difficulty dressing or bathing?: No ?Independently performs ADLs?: Yes (appropriate for developmental age) ?Does the patient have difficulty walking or climbing stairs?: No ?Weakness of Legs: None ?Weakness of Arms/Hands: None ? ?Permission Sought/Granted ?  ?  ?   ?   ?   ?   ? ?Emotional Assessment ?  ?  ?  ?  ?Alcohol / Substance Use: Not Applicable ?Psych Involvement: No (comment) ? ?Admission diagnosis:  TIA (transient ischemic attack) [G45.9] ?Chronic atrial fibrillation (HCC) [I48.20] ?Current use of long term anticoagulation [Z79.01] ?Contusion of left hand, initial encounter [S60.222A] ?Minor head injury, initial encounter [S09.90XA] ?Fall, initial encounter [W19.XXXA] ?Patient Active Problem List  ? Diagnosis Date Noted  ? Syncope 08/28/2021  ? History of gout 08/28/2021  ? Rapid atrial fibrillation ([place]) 08/14/2021  ? Schizoaffective disorder, bipolar type ([place]) 08/14/2021  ? Chronic  hypoxemic respiratory failure ([place]) 08/14/2021  ? OSA (obstructive sleep apnea)   ? Diabetes mellitus type 2, uncomplicated ([place])   ? Gout flare   ? Left-sided weakness 08/02/2021  ? Obesity, Class III, BMI 40-49.9 (morbid obesity) ([place]) 08/02/2021  ? Atrial fibrillation, chronic ([place]) 08/02/2021  ? Current use of long term anticoagulation 08/02/2021  ? ?PCP:  Lauretta Grill, NP ?Pharmacy:    ?[facility], [address] ?[place] [address] ?Phone: [phone] Fax: [phone] ? ?[place], [address] ?[place] ?[place] [address] ?Phone: [phone] Fax: [phone] ? ? ? ? ?Social Determinants of Health (SDOH) Interventions ?  ? ?Readmission Risk Interventions ?   ? View : No data to display.  ?  ?  ?  ? ? ? ?

## 2021-08-29 NOTE — Progress Notes (Signed)
OJ from ALF present for pt discharge; verbally reviewed AVS with OJ; no questions voiced at this time; pt discharged via wheelchair by nursing to [facility] ?

## 2021-08-31 ENCOUNTER — Other Ambulatory Visit: Payer: Self-pay

## 2021-08-31 ENCOUNTER — Emergency Department: Payer: Medicaid Other

## 2021-08-31 ENCOUNTER — Emergency Department
Admission: EM | Admit: 2021-08-31 | Discharge: 2021-08-31 | Disposition: A | Discharge status: Home / Self Care | Payer: Medicaid Other | Attending: Emergency Medicine | Admitting: Emergency Medicine

## 2021-08-31 DIAGNOSIS — M545 Low back pain, unspecified: Secondary | ICD-10-CM | POA: Diagnosis not present

## 2021-08-31 DIAGNOSIS — E119 Type 2 diabetes mellitus without complications: Secondary | ICD-10-CM | POA: Diagnosis not present

## 2021-08-31 DIAGNOSIS — I10 Essential (primary) hypertension: Secondary | ICD-10-CM | POA: Insufficient documentation

## 2021-08-31 DIAGNOSIS — R350 Frequency of micturition: Secondary | ICD-10-CM | POA: Diagnosis present

## 2021-08-31 DIAGNOSIS — N3001 Acute cystitis with hematuria: Secondary | ICD-10-CM | POA: Insufficient documentation

## 2021-08-31 LAB — URINALYSIS, ROUTINE W REFLEX MICROSCOPIC
Specific Gravity, Urine: 1.024 (ref 1.005–1.030)
WBC, UA: >50 WBC/hpf — ABNORMAL HIGH (ref 0–5)

## 2021-08-31 LAB — COMPREHENSIVE METABOLIC PANEL
ALT: 18 U/L (ref 0–44)
AST: 22 U/L (ref 15–41)
Albumin: 3.7 g/dL (ref 3.5–5.0)
Alkaline Phosphatase: 56 U/L (ref 38–126)
Anion gap: 10 (ref 5–15)
BUN: 14 mg/dL (ref 6–20)
CO2: 31 mmol/L (ref 22–32)
Calcium: 9.2 mg/dL (ref 8.9–10.3)
Chloride: 96 mmol/L — ABNORMAL LOW (ref 98–111)
Creatinine, Ser: 0.96 mg/dL (ref 0.61–1.24)
GFR, Estimated: >60 mL/min (ref >60)
Glucose, Bld: 92 mg/dL (ref 70–99)
Potassium: 4 mmol/L (ref 3.5–5.1)
Sodium: 137 mmol/L (ref 135–145)
Total Bilirubin: 0.8 mg/dL (ref 0.3–1.2)
Total Protein: 7.2 g/dL (ref 6.5–8.1)

## 2021-08-31 LAB — CBC WITH DIFFERENTIAL/PLATELET
Abs Immature Granulocytes: 0.09 10*3/uL — ABNORMAL HIGH (ref 0.00–0.07)
Basophils Absolute: 0 10*3/uL (ref 0.0–0.1)
Basophils Relative: 0 %
Eosinophils Absolute: 0.2 10*3/uL (ref 0.0–0.5)
Eosinophils Relative: 2 %
HCT: 41.3 % (ref 39.0–52.0)
Hemoglobin: 13.2 g/dL (ref 13.0–17.0)
Immature Granulocytes: 1 %
Lymphocytes Relative: 19 %
Lymphs Abs: 1.7 10*3/uL (ref 0.7–4.0)
MCH: 27.9 pg (ref 26.0–34.0)
MCHC: 32 g/dL (ref 30.0–36.0)
MCV: 87.3 fL (ref 80.0–100.0)
Monocytes Absolute: 0.9 10*3/uL (ref 0.1–1.0)
Monocytes Relative: 10 %
Neutro Abs: 6.3 10*3/uL (ref 1.7–7.7)
Neutrophils Relative %: 68 %
Platelets: 192 10*3/uL (ref 150–400)
RBC: 4.73 MIL/uL (ref 4.22–5.81)
RDW: 13.6 % (ref 11.5–15.5)
WBC: 9.3 10*3/uL (ref 4.0–10.5)
nRBC: 0 % (ref 0.0–0.2)

## 2021-08-31 MED ORDER — CEFPODOXIME PROXETIL 200 MG PO TABS: 200.0000 mg | ORAL_TABLET | Freq: Two times a day (BID) | ORAL | 0 refills | Status: DC

## 2021-08-31 MED ORDER — SODIUM CHLORIDE 0.9 % IV BOLUS
1000.0000 mL | Freq: Once | INTRAVENOUS | Status: AC
Start: 2021-08-31 — End: 2021-08-31
  Administered 2021-08-31: 1000 mL via INTRAVENOUS

## 2021-08-31 MED ORDER — PHENAZOPYRIDINE HCL 95 MG PO TABS: 95.0000 mg | ORAL_TABLET | Freq: Three times a day (TID) | ORAL | 0 refills | Status: DC | PRN

## 2021-08-31 MED ORDER — SODIUM CHLORIDE 0.9 % IV SOLN
1.0000 g | Freq: Once | INTRAVENOUS | Status: AC
  Administered 2021-08-31: 1 g via INTRAVENOUS
  Filled 2021-08-31: qty 10

## 2021-08-31 NOTE — Discharge Instructions (Addendum)
Take cefpodoxime twice daily for 10 days. ?

## 2021-08-31 NOTE — ED Notes (Signed)
Sent urine again to lab. Urine is orange colored. ?

## 2021-08-31 NOTE — ED Notes (Signed)
This nurse spoke with [name]  ?  states she will not consent to CT,MRI  also he is not to have any narcotic pain meds  he is very manipulative   He also has a court date tomorrow . ?

## 2021-08-31 NOTE — ED Triage Notes (Signed)
Patient to ER via POV with complaints of urinary frequency and dysuria for several days. Reports possible UTI.  ?

## 2021-08-31 NOTE — ED Notes (Signed)
Pt legal guardian is Florencia Reasons, [facility] at [phone]. Attempted to notify without success. ?

## 2021-08-31 NOTE — ED Provider Notes (Signed)
? ?[facility] ?Provider Note ? ?Patient Contact: 3:37 PM (approximate) ? ? ?History  ? ?Urinary Frequency ? ? ?HPI ? ?Daniel [name] is a 31 y.o. male with a history urinary tract infections, atrial fibrillation currently anticoagulated with Eliquis, obstructive sleep apnea, type 2 diabetes, hypertension and hyperlipidemia presents to the emergency department with hematuria, dysuria and increased urinary frequency.  I spoke to group home representative [name] who states that she does not want patient to have any CT scans or narcotics as patient has had numerous CT scans in the past.  Patient is unsure of fever but states that he is having bilateral low back pain.  No chest pain or abdominal pain. ? ?  ? ? ?Physical Exam  ? ?Triage Vital Signs: ?ED Triage Vitals  ?Enc Vitals Group  ?   BP 08/31/21 1414 103/68  ?   Pulse Rate 08/31/21 1414 92  ?   Resp 08/31/21 1414 18  ?   Temp 08/31/21 1414 99.1 ?F (37.3 ?C)  ?   Temp Source 08/31/21 1414 Oral  ?   SpO2 08/31/21 1414 95 %  ?   Weight 08/31/21 1454 (!) 394 lb 10 oz (179 kg)  ?   Height 08/31/21 1412 6\' 5"  (1.956 m)  ?   Head Circumference --   ?   Peak Flow --   ?   Pain Score 08/31/21 1412 10  ?   Pain Loc --   ?   Pain Edu? --   ?   Excl. in GC? --   ? ? ?Most recent vital signs: ?Vitals:  ? 08/31/21 1547 08/31/21 1746  ?BP: 119/83 120/80  ?Pulse: 98 88  ?Resp: 18 18  ?Temp: 98 ?F (36.7 ?C)   ?SpO2: 95% 96%  ? ? ? ?General: Alert and in no acute distress. ?Eyes:  PERRL. EOMI. ?Head: No acute traumatic findings ?ENT: ?     Ears:  ?     Nose: No congestion/rhinnorhea. ?     Mouth/Throat: Mucous membranes are moist.  ?Neck: No stridor. No cervical spine tenderness to palpation. ?Cardiovascular:  Good peripheral perfusion ?Respiratory: Normal respiratory effort without tachypnea or retractions. Lungs CTAB. Good air entry to the bases with no decreased or absent breath sounds. ?Gastrointestinal: Bowel sounds ?4 quadrants. Soft and nontender  to palpation. No guarding or rigidity. No palpable masses. No distention. No CVA tenderness. ?Musculoskeletal: Full range of motion to all extremities.  ?Neurologic:  No gross focal neurologic deficits are appreciated.  ?Skin:   No rash noted ?Other: ? ? ?ED Results / Procedures / Treatments  ? ?Labs ?(all labs ordered are listed, but only abnormal results are displayed) ?Labs Reviewed  ?URINALYSIS, ROUTINE W REFLEX MICROSCOPIC - Abnormal; Notable for the following components:  ?    Result Value  ? Color, Urine RED (*)   ? APPearance CLOUDY (*)   ? Glucose, UA   (*)   ? Value: TEST NOT REPORTED DUE TO COLOR INTERFERENCE OF URINE PIGMENT  ? Hgb urine dipstick   (*)   ? Value: TEST NOT REPORTED DUE TO COLOR INTERFERENCE OF URINE PIGMENT  ? Bilirubin Urine   (*)   ? Value: TEST NOT REPORTED DUE TO COLOR INTERFERENCE OF URINE PIGMENT  ? Ketones, ur   (*)   ? Value: TEST NOT REPORTED DUE TO COLOR INTERFERENCE OF URINE PIGMENT  ? Protein, ur   (*)   ? Value: TEST NOT REPORTED DUE TO COLOR INTERFERENCE OF URINE  PIGMENT  ? Nitrite   (*)   ? Value: TEST NOT REPORTED DUE TO COLOR INTERFERENCE OF URINE PIGMENT  ? Leukocytes,Ua   (*)   ? Value: TEST NOT REPORTED DUE TO COLOR INTERFERENCE OF URINE PIGMENT  ? WBC, UA >50 (*)   ? Bacteria, UA MANY (*)   ? All other components within normal limits  ?CBC WITH DIFFERENTIAL/PLATELET - Abnormal; Notable for the following components:  ? Abs Immature Granulocytes 0.09 (*)   ? All other components within normal limits  ?COMPREHENSIVE METABOLIC PANEL - Abnormal; Notable for the following components:  ? Chloride 96 (*)   ? All other components within normal limits  ?URINE CULTURE  ? ? ? ? ? ?PROCEDURES: ? ?Critical Care performed: No ? ?Procedures ? ? ?MEDICATIONS ORDERED IN ED: ?Medications  ?sodium chloride 0.9 % bolus 1,000 mL (0 mLs Intravenous Stopped 08/31/21 1727)  ?cefTRIAXone (ROCEPHIN) 1 g in sodium chloride 0.9 % 100 mL IVPB (0 g Intravenous Stopped 08/31/21 1746)  ? ? ? ?IMPRESSION  / MDM / ASSESSMENT AND PLAN / ED COURSE  ?I reviewed the triage vital signs and the nursing notes. ?             ?               ? ?Assessment and plan ?UTI  ?Differential diagnosis includes, but is not limited to, UTI, pyelonephritis, nephrolithiasis... ? ?31 year old male with past medical history detailed above presents to the emergency department with dysuria, hematuria and increased urinary frequency. ? ?I spoke with group home attendant [name] who consented for renal ultrasound and basic labs.  We will also give IV normal saline. ? ?Renal ultrasound shows nonobstructing stone on the right.  CBC and CMP within range.  Patient was given IV Rocephin and started on cefpodoxime.  Patient's caretaker, [name] was contacted in brief regarding results.  Return precautions were given to return to the emergency department with new or worsening symptoms. ?Clinical Course as of 08/31/21 1954  ?Tue Aug 31, 2021  ?1611 nRBC: 0.0 [JW]  ?  ?Clinical Course User Index ?[JW] Pia Mau M, PA-C  ? ? ? ?FINAL CLINICAL IMPRESSION(S) / ED DIAGNOSES  ? ?Final diagnoses:  ?Acute cystitis with hematuria  ? ? ? ?Rx / DC Orders  ? ?ED Discharge Orders   ? ?      Ordered  ?  cefpodoxime (VANTIN) 200 MG tablet  2 times daily,   Status:  Discontinued       ? 08/31/21 1742  ?  cefpodoxime (VANTIN) 200 MG tablet  2 times daily       ? 08/31/21 1816  ?  phenazopyridine (PYRIDIUM) 95 MG tablet  3 times daily PRN       ? 08/31/21 1816  ? ?  ?  ? ?  ? ? ? ?Note:  This document was prepared using Dragon voice recognition software and may include unintentional dictation errors. ?  ?Orvil Feil, PA-C ?08/31/21 1954 ? ?  ?Merwyn Katos, MD ?08/31/21 2010 ? ?

## 2021-09-03 LAB — URINE CULTURE: Culture: 40000 — AB

## 2021-09-04 ENCOUNTER — Emergency Department: Payer: Medicaid Other

## 2021-09-04 ENCOUNTER — Other Ambulatory Visit: Payer: Self-pay

## 2021-09-04 ENCOUNTER — Inpatient Hospital Stay
Admission: EM | Admit: 2021-09-04 | Discharge: 2021-09-09 | DRG: 697 | Disposition: A | Discharge status: Home / Self Care | Payer: Medicaid Other | Attending: Internal Medicine | Admitting: Internal Medicine

## 2021-09-04 DIAGNOSIS — N35912 Unspecified bulbous urethral stricture, male: Principal | ICD-10-CM | POA: Diagnosis present

## 2021-09-04 DIAGNOSIS — I4819 Other persistent atrial fibrillation: Secondary | ICD-10-CM | POA: Diagnosis present

## 2021-09-04 DIAGNOSIS — E119 Type 2 diabetes mellitus without complications: Secondary | ICD-10-CM | POA: Diagnosis present

## 2021-09-04 DIAGNOSIS — E875 Hyperkalemia: Secondary | ICD-10-CM | POA: Diagnosis present

## 2021-09-04 DIAGNOSIS — N419 Inflammatory disease of prostate, unspecified: Secondary | ICD-10-CM

## 2021-09-04 DIAGNOSIS — N200 Calculus of kidney: Secondary | ICD-10-CM | POA: Diagnosis present

## 2021-09-04 DIAGNOSIS — G459 Transient cerebral ischemic attack, unspecified: Secondary | ICD-10-CM | POA: Diagnosis not present

## 2021-09-04 DIAGNOSIS — Z6841 Body Mass Index (BMI) 40.0 and over, adult: Secondary | ICD-10-CM | POA: Diagnosis not present

## 2021-09-04 DIAGNOSIS — I429 Cardiomyopathy, unspecified: Secondary | ICD-10-CM | POA: Diagnosis present

## 2021-09-04 DIAGNOSIS — R531 Weakness: Secondary | ICD-10-CM

## 2021-09-04 DIAGNOSIS — G40909 Epilepsy, unspecified, not intractable, without status epilepticus: Secondary | ICD-10-CM | POA: Diagnosis present

## 2021-09-04 DIAGNOSIS — I482 Chronic atrial fibrillation, unspecified: Secondary | ICD-10-CM

## 2021-09-04 DIAGNOSIS — I4892 Unspecified atrial flutter: Secondary | ICD-10-CM | POA: Diagnosis present

## 2021-09-04 DIAGNOSIS — Z7901 Long term (current) use of anticoagulants: Secondary | ICD-10-CM

## 2021-09-04 DIAGNOSIS — Z888 Allergy status to other drugs, medicaments and biological substances status: Secondary | ICD-10-CM

## 2021-09-04 DIAGNOSIS — Z79899 Other long term (current) drug therapy: Secondary | ICD-10-CM | POA: Diagnosis not present

## 2021-09-04 DIAGNOSIS — G4733 Obstructive sleep apnea (adult) (pediatric): Secondary | ICD-10-CM | POA: Diagnosis present

## 2021-09-04 DIAGNOSIS — N390 Urinary tract infection, site not specified: Secondary | ICD-10-CM | POA: Diagnosis present

## 2021-09-04 DIAGNOSIS — R339 Retention of urine, unspecified: Secondary | ICD-10-CM

## 2021-09-04 DIAGNOSIS — R310 Gross hematuria: Secondary | ICD-10-CM | POA: Diagnosis present

## 2021-09-04 DIAGNOSIS — R29818 Other symptoms and signs involving the nervous system: Secondary | ICD-10-CM | POA: Diagnosis present

## 2021-09-04 DIAGNOSIS — N139 Obstructive and reflux uropathy, unspecified: Secondary | ICD-10-CM | POA: Diagnosis present

## 2021-09-04 DIAGNOSIS — K219 Gastro-esophageal reflux disease without esophagitis: Secondary | ICD-10-CM | POA: Diagnosis present

## 2021-09-04 DIAGNOSIS — Z87891 Personal history of nicotine dependence: Secondary | ICD-10-CM

## 2021-09-04 DIAGNOSIS — I10 Essential (primary) hypertension: Secondary | ICD-10-CM | POA: Diagnosis present

## 2021-09-04 DIAGNOSIS — R55 Syncope and collapse: Secondary | ICD-10-CM

## 2021-09-04 DIAGNOSIS — F250 Schizoaffective disorder, bipolar type: Secondary | ICD-10-CM | POA: Diagnosis present

## 2021-09-04 DIAGNOSIS — E785 Hyperlipidemia, unspecified: Secondary | ICD-10-CM | POA: Diagnosis present

## 2021-09-04 DIAGNOSIS — F449 Dissociative and conversion disorder, unspecified: Secondary | ICD-10-CM | POA: Diagnosis present

## 2021-09-04 HISTORY — DX: Dissociative and conversion disorder, unspecified: F44.9

## 2021-09-04 HISTORY — DX: Schizoaffective disorder, unspecified: F25.9

## 2021-09-04 HISTORY — DX: Epilepsy, unspecified, not intractable, without status epilepticus: G40.909

## 2021-09-04 HISTORY — DX: Chest pain, unspecified: R07.9

## 2021-09-04 HISTORY — DX: Cardiomyopathy, unspecified: I42.9

## 2021-09-04 HISTORY — DX: Unspecified urethral stricture, male, unspecified site: N35.919

## 2021-09-04 HISTORY — DX: Other persistent atrial fibrillation: I48.19

## 2021-09-04 LAB — URINALYSIS, ROUTINE W REFLEX MICROSCOPIC
Bilirubin Urine: NEGATIVE
Glucose, UA: NEGATIVE mg/dL
Hgb urine dipstick: NEGATIVE
Ketones, ur: NEGATIVE mg/dL
Leukocytes,Ua: NEGATIVE
Nitrite: NEGATIVE
Protein, ur: NEGATIVE mg/dL
Specific Gravity, Urine: 1.02 (ref 1.005–1.030)
pH: 7 (ref 5.0–8.0)

## 2021-09-04 LAB — BASIC METABOLIC PANEL
Anion gap: 9 (ref 5–15)
BUN: 14 mg/dL (ref 6–20)
CO2: 31 mmol/L (ref 22–32)
Calcium: 9.1 mg/dL (ref 8.9–10.3)
Chloride: 97 mmol/L — ABNORMAL LOW (ref 98–111)
Creatinine, Ser: 0.84 mg/dL (ref 0.61–1.24)
GFR, Estimated: >60 mL/min (ref >60)
Glucose, Bld: 182 mg/dL — ABNORMAL HIGH (ref 70–99)
Potassium: 4.2 mmol/L (ref 3.5–5.1)
Sodium: 137 mmol/L (ref 135–145)

## 2021-09-04 LAB — CBC
HCT: 42.8 % (ref 39.0–52.0)
Hemoglobin: 13.9 g/dL (ref 13.0–17.0)
MCH: 28.1 pg (ref 26.0–34.0)
MCHC: 32.5 g/dL (ref 30.0–36.0)
MCV: 86.6 fL (ref 80.0–100.0)
Platelets: 161 10*3/uL (ref 150–400)
RBC: 4.94 MIL/uL (ref 4.22–5.81)
RDW: 13.4 % (ref 11.5–15.5)
WBC: 6.6 10*3/uL (ref 4.0–10.5)
nRBC: 0 % (ref 0.0–0.2)

## 2021-09-04 LAB — TROPONIN I (HIGH SENSITIVITY)
Troponin I (High Sensitivity): 4 ng/L (ref <18)
Troponin I (High Sensitivity): 4 ng/L (ref <18)

## 2021-09-04 LAB — GLUCOSE, CAPILLARY: Glucose-Capillary: 101 mg/dL — ABNORMAL HIGH (ref 70–99)

## 2021-09-04 MED ORDER — METOPROLOL TARTRATE 50 MG PO TABS
50.0000 mg | ORAL_TABLET | Freq: Two times a day (BID) | ORAL | Status: DC
  Administered 2021-09-04 – 2021-09-07 (×6): 50 mg via ORAL
  Filled 2021-09-04 (×6): qty 1

## 2021-09-04 MED ORDER — APIXABAN 5 MG PO TABS
5.0000 mg | ORAL_TABLET | Freq: Two times a day (BID) | ORAL | Status: DC
  Administered 2021-09-04 – 2021-09-06 (×4): 5 mg via ORAL
  Filled 2021-09-04 (×4): qty 1

## 2021-09-04 MED ORDER — LORATADINE 10 MG PO TABS
10.0000 mg | ORAL_TABLET | Freq: Every day | ORAL | Status: DC
  Administered 2021-09-04 – 2021-09-09 (×6): 10 mg via ORAL
  Filled 2021-09-04 (×6): qty 1

## 2021-09-04 MED ORDER — ALBUTEROL SULFATE (2.5 MG/3ML) 0.083% IN NEBU: 3.0000 mL | INHALATION_SOLUTION | Freq: Four times a day (QID) | RESPIRATORY_TRACT | Status: DC | PRN

## 2021-09-04 MED ORDER — ACETAMINOPHEN 325 MG PO TABS
650.0000 mg | ORAL_TABLET | Freq: Four times a day (QID) | ORAL | Status: DC | PRN
  Administered 2021-09-06: 650 mg via ORAL
  Filled 2021-09-04 (×2): qty 2

## 2021-09-04 MED ORDER — FLUTICASONE PROPIONATE 50 MCG/ACT NA SUSP: 2.0000 | Freq: Two times a day (BID) | NASAL | Status: DC | PRN

## 2021-09-04 MED ORDER — CHLORHEXIDINE GLUCONATE 0.12% ORAL RINSE (MEDLINE KIT)
15.0000 mL | Freq: Two times a day (BID) | OROMUCOSAL | Status: DC
  Administered 2021-09-04 – 2021-09-09 (×4): 15 mL via OROMUCOSAL

## 2021-09-04 MED ORDER — LIDOCAINE HCL URETHRAL/MUCOSAL 2 % EX GEL
1.0000 | Freq: Once | CUTANEOUS | Status: AC
Start: 2021-09-04 — End: 2021-09-04
  Administered 2021-09-04: 1 via URETHRAL
  Filled 2021-09-04: qty 10

## 2021-09-04 MED ORDER — DIVALPROEX SODIUM 500 MG PO DR TAB
1500.0000 mg | DELAYED_RELEASE_TABLET | Freq: Every day | ORAL | Status: DC
Start: 2021-09-04 — End: 2021-09-09
  Administered 2021-09-04 – 2021-09-08 (×5): 1500 mg via ORAL
  Filled 2021-09-04 (×6): qty 3

## 2021-09-04 MED ORDER — ESCITALOPRAM OXALATE 10 MG PO TABS
20.0000 mg | ORAL_TABLET | Freq: Every day | ORAL | Status: DC
  Administered 2021-09-05 – 2021-09-09 (×5): 20 mg via ORAL
  Filled 2021-09-04 (×4): qty 1
  Filled 2021-09-04: qty 2

## 2021-09-04 MED ORDER — ALLOPURINOL 100 MG PO TABS
300.0000 mg | ORAL_TABLET | Freq: Every day | ORAL | Status: DC
  Administered 2021-09-05 – 2021-09-09 (×5): 300 mg via ORAL
  Filled 2021-09-04 (×5): qty 3

## 2021-09-04 MED ORDER — CIPROFLOXACIN IN D5W 400 MG/200ML IV SOLN
400.0000 mg | Freq: Two times a day (BID) | INTRAVENOUS | Status: DC
  Administered 2021-09-04 – 2021-09-05 (×2): 400 mg via INTRAVENOUS
  Filled 2021-09-04 (×3): qty 200

## 2021-09-04 MED ORDER — ORAL CARE MOUTH RINSE
15.0000 mL | OROMUCOSAL | Status: DC
  Administered 2021-09-04: 15 mL via OROMUCOSAL

## 2021-09-04 MED ORDER — INSULIN ASPART 100 UNIT/ML IJ SOLN
0.0000 [IU] | Freq: Three times a day (TID) | INTRAMUSCULAR | Status: DC
  Administered 2021-09-05: 3 [IU] via SUBCUTANEOUS
  Administered 2021-09-05: 4 [IU] via SUBCUTANEOUS
  Administered 2021-09-06: 7 [IU] via SUBCUTANEOUS
  Administered 2021-09-08 – 2021-09-09 (×4): 4 [IU] via SUBCUTANEOUS
  Administered 2021-09-09: 3 [IU] via SUBCUTANEOUS
  Filled 2021-09-04 (×8): qty 1

## 2021-09-04 MED ORDER — ACETAMINOPHEN 650 MG RE SUPP: 650.0000 mg | Freq: Four times a day (QID) | RECTAL | Status: DC | PRN

## 2021-09-04 MED ORDER — DIVALPROEX SODIUM 500 MG PO DR TAB
1000.0000 mg | DELAYED_RELEASE_TABLET | Freq: Every morning | ORAL | Status: DC
  Administered 2021-09-05 – 2021-09-09 (×5): 1000 mg via ORAL
  Filled 2021-09-04 (×5): qty 2

## 2021-09-04 MED ORDER — BACLOFEN 10 MG PO TABS
10.0000 mg | ORAL_TABLET | Freq: Two times a day (BID) | ORAL | Status: DC | PRN
  Administered 2021-09-07 – 2021-09-08 (×2): 10 mg via ORAL
  Filled 2021-09-04 (×4): qty 1

## 2021-09-04 MED ORDER — DILTIAZEM HCL ER COATED BEADS 180 MG PO CP24
360.0000 mg | ORAL_CAPSULE | Freq: Every day | ORAL | Status: DC
  Administered 2021-09-05 – 2021-09-08 (×4): 360 mg via ORAL
  Filled 2021-09-04 (×4): qty 2

## 2021-09-04 MED ORDER — BISACODYL 5 MG PO TBEC: 5.0000 mg | DELAYED_RELEASE_TABLET | Freq: Every day | ORAL | Status: DC | PRN

## 2021-09-04 MED ORDER — MELATONIN 5 MG PO TABS
2.5000 mg | ORAL_TABLET | Freq: Every day | ORAL | Status: DC
  Administered 2021-09-04 – 2021-09-08 (×5): 2.5 mg via ORAL
  Filled 2021-09-04 (×5): qty 1

## 2021-09-04 MED ORDER — PANTOPRAZOLE SODIUM 40 MG PO TBEC
40.0000 mg | DELAYED_RELEASE_TABLET | Freq: Every day | ORAL | Status: DC
  Administered 2021-09-05 – 2021-09-09 (×5): 40 mg via ORAL
  Filled 2021-09-04 (×5): qty 1

## 2021-09-04 MED ORDER — PALIPERIDONE ER 3 MG PO TB24
9.0000 mg | ORAL_TABLET | Freq: Every morning | ORAL | Status: DC
  Administered 2021-09-05 – 2021-09-09 (×5): 9 mg via ORAL
  Filled 2021-09-04 (×5): qty 3

## 2021-09-04 NOTE — ED Notes (Signed)
Pt given blankets per request.

## 2021-09-04 NOTE — ED Notes (Signed)
Urology and hospital MD made aware of pt void and bladder scan ?

## 2021-09-04 NOTE — ED Notes (Signed)
Per MD Diamantina Providence, has spoken to legal guardian  ?

## 2021-09-04 NOTE — ED Notes (Signed)
Sela Hilding, caregiver, would like to be notified of patient's disposition when one is made. Will notify primary RN. Phone number listed in chart.  ?

## 2021-09-04 NOTE — Plan of Care (Signed)
Patient seen at bedside. Patient apparently was able to void 300 cc of urine.Associated dysuria. Bladder scan post void shows about 700cc ? ?

## 2021-09-04 NOTE — H&P (Signed)
?History and Physical  ? ? ?Patient: Daniel [name] [id number] DOB: [birth year] ?DOA: 09/04/2021 ?DOS: the patient was seen and examined on 09/04/2021 ?PCP: Anselm Jungling, NP  ?Patient coming from:  Group home ? ?Chief Complaint:  ?Chief Complaint  ?Patient presents with  ? Loss of Consciousness  ? ?HPI: Keyshaun [name] is a 31 y.o. male with medical history significant for  schizoaffective d/o , conversion disorder, HLD, a fib on eliquis, seizure disorder, GERD who presents via EMS from Group home on account of an episode of syncope. Patient denied hitting heis head. Incidentally, patient was recently seen and evaluated on 08/28/2021 on account of similar symptoms and was worked up with CT and MRI.  He was again seen on 08/31/2021 in the ED for urinary tract infection and found to have nonobstructing right kidney stone.  He was discharged home on cefpodoxime.  He presented today on account of dysuria and an episode of syncope. Symptoms were preceded by lightheadedness.  Denied any associated nausea or vomiting.   ?In the ED, patient complained of lower abdominal discomfort.  Bladder scan done in the ED did reveal greater than 700 cc of urine.  Attempts to catheterize patient in the ED by nurses were unsuccessful.  Urology was hence consulted to help with Foley catheter placement.  Patient's caretaker [name] however declined for procedure hence conservative management with watchful waiting was advised.  Whilst in the ED, patient was able to void 300 cc of urine with some difficulty.  Postvoid bladder scan shows 700 cc of urine.  He feels much better.  Denies any nausea vomiting and requesting for his meals. ? ?Patient does not exhibit any signs of thought disorder at this time.  Denies any suicidal ideations or homicidal ideas. ? Review of Systems: As mentioned in the history of present illness. All other systems reviewed and are negative. ?Past Medical History:  ?Diagnosis Date  ? Atrial fibrillation (HCC)   ?  Current use of long term anticoagulation   ? Diabetes mellitus type 2, uncomplicated (HCC)   ? Hyperlipidemia   ? Hypertension   ? OSA (obstructive sleep apnea)   ? TIA (transient ischemic attack)   ? ?History reviewed. No pertinent surgical history. ?Social History:  reports that he has quit smoking. His smoking use included cigarettes. He has never used smokeless tobacco. He reports that he does not currently use alcohol. He reports that he does not use drugs. ? ?Allergies  ?Allergen Reactions  ? Haldol [Haloperidol] Other (See Comments)  ?  SI  ? Abilify [Aripiprazole] Palpitations  ? Demerol [Meperidine Hcl] Hives  ? ? ?No family history on file. ? ?Prior to Admission medications   ?Medication Sig Start Date End Date Taking? Authorizing Provider  ?allopurinol (ZYLOPRIM) 300 MG tablet Take 1 tablet (300 mg total) by mouth daily. 08/29/21  Yes Wieting, Richard, MD  ?atorvastatin (LIPITOR) 80 MG tablet Take 80 mg by mouth daily. 05/11/21  Yes [provider]  ?baclofen (LIORESAL) 10 MG tablet Take 1 tablet (10 mg total) by mouth 2 (two) times daily as needed for muscle spasms. Home med. 08/16/21  Yes Darlin Priestly, MD  ?cefpodoxime (VANTIN) 200 MG tablet Take 1 tablet (200 mg total) by mouth 2 (two) times daily for 10 days. 08/31/21 09/10/21 Yes Merwyn Katos, MD  ?diltiazem (CARDIZEM CD) 360 MG 24 hr capsule Take 1 capsule (360 mg total) by mouth daily. 08/29/21 09/28/21 Yes Wieting, Richard, MD  ?divalproex (DEPAKOTE) 500 MG DR tablet Take 1,500 mg  by mouth 2 (two) times daily. 1000 mg every morning and 1500 mg at bedtime 07/21/21  Yes [provider]  ?ELIQUIS 5 MG TABS tablet Take 5 mg by mouth 2 (two) times daily. 06/16/21  Yes [provider]  ?escitalopram (LEXAPRO) 10 MG tablet Take 20 mg by mouth daily. 06/16/21  Yes [provider]  ?fluticasone (FLONASE) 50 MCG/ACT nasal spray Place 2 sprays into both nostrils 2 (two) times daily. 06/16/21  Yes [provider]  ?INVEGA 9 MG  24 hr tablet Take 9 mg by mouth every morning. 07/21/21  Yes [provider]  ?loratadine (CLARITIN) 10 MG tablet Take 10 mg by mouth daily. 06/16/21  Yes [provider]  ?melatonin 3 MG TABS tablet Take 3 mg by mouth at bedtime. 06/08/21  Yes [provider]  ?metoprolol tartrate (LOPRESSOR) 50 MG tablet Take 1 tablet (50 mg total) by mouth 2 (two) times daily. 08/29/21  Yes Wieting, Richard, MD  ?omeprazole (PRILOSEC) 20 MG capsule Take 20 mg by mouth daily.   Yes [provider]  ?albuterol (VENTOLIN HFA) 108 (90 Base) MCG/ACT inhaler Inhale 2 puffs into the lungs every 6 (six) hours as needed for wheezing. 07/12/21   [provider]  ?phenazopyridine (PYRIDIUM) 95 MG tablet Take 1 tablet (95 mg total) by mouth 3 (three) times daily as needed for pain. 08/31/21   Merwyn Katos, MD  ? ? ?Physical Exam: ?Vitals:  ? 09/04/21 1300 09/04/21 1430 09/04/21 1620 09/04/21 1736  ?BP: (!) 112/100 121/76  124/71  ?Pulse: 75   90  ?Resp: 18 20  18   ?Temp:   98.7 ?F (37.1 ?C) 98.7 ?F (37.1 ?C)  ?TempSrc:   Oral Oral  ?SpO2: 93%   96%  ?Weight:      ?Height:      ? ?Morbidly obese man.  BMI of 46.  Alert oriented x3.  Head is atraumatic.  Neck supple.  Pupils equal round reactive to light and accommodation.  Chest clinically diminished due to body habitus.  Cardiovascular: S1-S2 with no murmurs appreciated. ?Respiratory: Chest clinically diminished due to body habitus.  No added sounds appreciated. ?Abdomen: Soft rotund.  No palpable organomegaly.  Bowel sounds present and normal. ?Extremities: Without pedal edema. ?CNS shows no focal deficits. ?Skin negative for any new rash. ? ?Data Reviewed: ? ?Results are pending, will review when available. ? ?Urine cultures from 04/04 shows evidence of UTI with 40,000 colonies of corynebacterium diphtheroids and staph stimulans noted. ? ?Repeat urinalysis today however is unremarkable. ? ?CBC: Unremarkable.  BMP pertinent for glucose of 182.  Last  A1c was 6.1. ? ?CT scan and MRI done on 08/28/2021 reviewed. ? ?Assessment and Plan: ?No notes have been filed under this hospital service. ?Service: Hospitalist ? ?31 year old gentleman with schizoaffective disorder, conversion disorder with multiple ER overutilization, history of paroxysmal atrial fibrillation on anticoagulation with Eliquis.  Patient was diagnosed with presumed urinary tract infection 4 days ago and was discharged home on cefpodoxime.  He returns to the ED after an episode of syncope at the facility.  Denied hitting his head.  Symptoms were preceded by lightheadedness. ? ?Syncope: Unclear etiology at this time.  May likely be vasovagal.  Patient also has history of seizure disorder which could likely be contributory.  Patient is alert oriented and back to baseline.  Due to recent imaging study including MRI, no indication for further neuroimaging.  Patient also denied hitting his head at this time.  We will monitor  patient through the course of the night.  Telemetry in place.  Orthostasis in a.m. ? ?Dysuria/urinary retention: Patient being treated for urinary tract infection since 04/04.  Cultures from 0404 showed growing diphtheroids and staph stimulans.  On this visit, urology was consulted.  Conservative management advised.  Patient able to void intermittently.  Urology advises antibiotics with Bactrim.  Bladder scan every 6 hourly advised.  On-call urology following and will need to be called if patient exhibits persistent signs of urinary retention. ? ?Paroxysmal atrial fibrillation: Rate and rhythm controlled on current regimen.  Continue with apixaban. ? ?Schizoaffective disorder, bipolar type: Continue with home medication.  No evidence of any thought disorder at this time.  History of conversion disorder with overutilization medical care.  Please contact guardian prior any testing or any decision regarding care. ? 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  Patient is on Depakote.  We will continue with same. ? ?Diabetes mellitus: We will continue with insulin sliding scale.  Fingerstick glucose monitoring as per protocol. ? ?Obesity class III-BMI

## 2021-09-04 NOTE — Progress Notes (Signed)
Contacted by ED requesting foley placement. ? ?31 yo M with extensive psychiatric history and conversion disorder, morbid obesity, admitted for possible syncope. Recent ER visit 08/31/21 with UA consistent with UTI and ultimately grew Diptheroids and Staph simulans, and was discharged on cefpodoxime. Renal/bladder US at that was normal. ? ?ER attempted multiple foley catheters today to obtain UA sample but unsuccessful, bladder scan . Renal function is normal. No history of urinary retention. Low suspicion for BPH at his age, urethral stricture a possibility. ? ?I had a long conversation with his legal guardian [name] about options including observation vs attempted foley/possible bedside cystoscopy and risks and benefits. She does not consent to further foley attempts or bedside cystoscopy at this time. I think it is reasonable to give him a chance to void, and he does not have severe lower abdominal pain that would be consistent with retention at this time. Urology will continue to follow ? ?-Recommend Bactrim DS BID based on prior culture results ?-Urology will continue to follow closely and be in touch with legal Guardian [id number] attempts at foley  ? ?Legrand Rams, MD ?09/04/2021 ? ?

## 2021-09-04 NOTE — ED Provider Notes (Signed)
? ?[facility] ?Provider Note ? ? ? Event Date/Time  ? First MD Initiated Contact with Patient 09/04/21 1206   ?  (approximate) ? ? ?History  ? ?Loss of Consciousness ? ? ?HPI ? ?Daniel [name] is a 31 y.o. male with amedical history significant for schizoaffective disorder, borderline personality disorder, Conversion disorder, psychogenic nonepileptic seizure and atrial fibrillation.  On a previous admission the patient [facility] where he had a syncopal event with no witnesses reporting seizure activity and upon awakening was noted to have left hemibody weakness and numbness.  Today history of A-fib and a UTI who reports his UTIs not getting better and today he went to  and woke up in the ambulance because he had passed out and fallen.  He denies hitting his head.  He does not have any headache or neck pain.  Does not have any bruising on his head.  He does have a bruise on his chest that he said happened when he tripped over charger fell several days ago.  He is not having any chest pain currently. ?  ? ? ?Physical Exam  ? ?Triage Vital Signs: ?ED Triage Vitals  ?Enc Vitals Group  ?   BP 09/04/21 1155 139/80  ?   Pulse Rate 09/04/21 1155 87  ?   Resp 09/04/21 1155 18  ?   Temp --   ?   Temp src --   ?   SpO2 09/04/21 1155 93 %  ?   Weight 09/04/21 1153 (!) 394 lb 10 oz (179 kg)  ?   Height 09/04/21 1153 6\' 5"  (1.956 m)  ?   Head Circumference --   ?   Peak Flow --   ?   Pain Score 09/04/21 1153 0  ?   Pain Loc --   ?   Pain Edu? --   ?   Excl. in [place]? --   ? ? ?Most recent vital signs: ?Vitals:  ? 09/04/21 1300 09/04/21 1430  ?BP: (!) 112/100 121/76  ?Pulse: 75   ?Resp: 18 20  ?SpO2: 93%   ? ? ?General: Awake, no distress.  ?CV:  Good peripheral perfusion.  Heart irregular rhythm no audible murmurs ?Resp:  Normal effort.  Lungs are clear ?Abd:  No distention.  Abdomen soft and nontender ? ? ? ?ED Results / Procedures / Treatments  ? ?Labs ?(all labs ordered  are listed, but only abnormal results are displayed) ?Labs Reviewed  ?BASIC METABOLIC PANEL - Abnormal; Notable for the following components:  ?    Result Value  ? Chloride 97 (*)   ? Glucose, Bld 182 (*)   ? All other components within normal limits  ?CBC  ?URINALYSIS, ROUTINE W REFLEX MICROSCOPIC  ?TROPONIN I (HIGH SENSITIVITY)  ?TROPONIN I (HIGH SENSITIVITY)  ? ? ? ?EKG ? ?EKG read interpreted by me shows a flutter at a rate of 87 normal axis there is flipped T waves inferiorly and in some of the chest leads but this is been present previously.  EMS did 2 EKGs 1 of which read and interpreted by me shows a heart rate of 94 normal axis a flutter no change from our EKG here ?Second EMS EKG also read interpreted by me shows a flutter at a rate of 96 normal axis also no change from here or previous EKGs ? ? ?RADIOLOGY ? ? ? ?PROCEDURES: ? ?Critical Care performed:  ? ?Procedures ? ? ?MEDICATIONS ORDERED IN ED: ?  Medications  ?lidocaine (XYLOCAINE) 2 % jelly 1 application. (has no administration in time range)  ? ? ? ?IMPRESSION / MDM / ASSESSMENT AND PLAN / ED COURSE  ?I reviewed the triage vital signs and the nursing notes. ?Patient has not been able to produce a urine specimen.  He says he cannot really pee very much.  We do a bladder scan and find 6 to 700 cc of fluid in his bladder.  Patient did complain of hematuria.  We will put him three-way catheter in so we can irrigate him if we need to.  We will use some Uro-Jet first. ? ?The patient is on the cardiac monitor to evaluate for evidence of arrhythmia and/or significant heart rate changes.  No significant arrhythmias were seen ?Cardiology does not have a reason that they would admit him for and I agree.  However he did have what appears to be true syncope and he cannot pass his urine.  If he does have a UTI or significant hematuria maybe he will need to come in to treat that since he appears to have failed outpatient treatment and or have obstructed his urine  outflow possibly with a clot.  I did a rectal exam on the patient as we cannot get a Foley in.  His Prostate is tender and boggy feeling.  I am going to call all the urologist and then I am going to have to sign him out to the oncoming provider however. ?  ? ? ?FINAL CLINICAL IMPRESSION(S) / ED DIAGNOSES  ? ?Final diagnoses:  ?Syncope and collapse  ?Prostatitis, unspecified prostatitis type  ?Inability to urinate  ? ? ? ?Rx / DC Orders  ? ?ED Discharge Orders   ? ? None  ? ?  ? ? ? ?Note:  This document was prepared using Dragon voice recognition software and may include unintentional dictation errors. ?  ?Nena Polio, MD ?09/04/21 1618 ? ?

## 2021-09-04 NOTE — ED Notes (Signed)
Multiple attempts by this RN and Crystal RN to pass 3 way foley/coude, unsuccessful. MD Malinda made aware. MD states he will call urology ?

## 2021-09-04 NOTE — ED Notes (Signed)
Called lab to add on troponin  

## 2021-09-04 NOTE — ED Triage Notes (Signed)
Pt arrives via EMS from his group home for syncopal episode- pt was recently diagnosed with UTI- pt has an extensive cardiac hx per EMS ?

## 2021-09-04 NOTE — ED Notes (Signed)
MD Acheampong at bedside ?

## 2021-09-04 NOTE — ED Notes (Signed)
Pt given sandwich box and water

## 2021-09-05 ENCOUNTER — Inpatient Hospital Stay: Payer: Medicaid Other

## 2021-09-05 ENCOUNTER — Encounter: Payer: Self-pay | Admitting: Radiology

## 2021-09-05 DIAGNOSIS — G4733 Obstructive sleep apnea (adult) (pediatric): Secondary | ICD-10-CM

## 2021-09-05 DIAGNOSIS — R531 Weakness: Secondary | ICD-10-CM

## 2021-09-05 DIAGNOSIS — N139 Obstructive and reflux uropathy, unspecified: Secondary | ICD-10-CM

## 2021-09-05 DIAGNOSIS — R339 Retention of urine, unspecified: Secondary | ICD-10-CM

## 2021-09-05 DIAGNOSIS — E119 Type 2 diabetes mellitus without complications: Secondary | ICD-10-CM | POA: Diagnosis not present

## 2021-09-05 DIAGNOSIS — R55 Syncope and collapse: Secondary | ICD-10-CM | POA: Diagnosis not present

## 2021-09-05 DIAGNOSIS — I482 Chronic atrial fibrillation, unspecified: Secondary | ICD-10-CM

## 2021-09-05 LAB — BASIC METABOLIC PANEL
Anion gap: 9 (ref 5–15)
BUN: 15 mg/dL (ref 6–20)
CO2: 30 mmol/L (ref 22–32)
Calcium: 8.9 mg/dL (ref 8.9–10.3)
Chloride: 96 mmol/L — ABNORMAL LOW (ref 98–111)
Creatinine, Ser: 0.83 mg/dL (ref 0.61–1.24)
GFR, Estimated: >60 mL/min (ref >60)
Glucose, Bld: 126 mg/dL — ABNORMAL HIGH (ref 70–99)
Potassium: 3.9 mmol/L (ref 3.5–5.1)
Sodium: 135 mmol/L (ref 135–145)

## 2021-09-05 LAB — COMPREHENSIVE METABOLIC PANEL
ALT: 18 U/L (ref 0–44)
AST: 32 U/L (ref 15–41)
Albumin: 3.5 g/dL (ref 3.5–5.0)
Alkaline Phosphatase: 53 U/L (ref 38–126)
Anion gap: 10 (ref 5–15)
BUN: 17 mg/dL (ref 6–20)
CO2: 28 mmol/L (ref 22–32)
Calcium: 9.3 mg/dL (ref 8.9–10.3)
Chloride: 95 mmol/L — ABNORMAL LOW (ref 98–111)
Creatinine, Ser: 1.1 mg/dL (ref 0.61–1.24)
GFR, Estimated: >60 mL/min (ref >60)
Glucose, Bld: 136 mg/dL — ABNORMAL HIGH (ref 70–99)
Potassium: 4.7 mmol/L (ref 3.5–5.1)
Sodium: 133 mmol/L — ABNORMAL LOW (ref 135–145)
Total Bilirubin: 0.7 mg/dL (ref 0.3–1.2)
Total Protein: 7.1 g/dL (ref 6.5–8.1)

## 2021-09-05 LAB — CBC
HCT: 40.9 % (ref 39.0–52.0)
Hemoglobin: 13.4 g/dL (ref 13.0–17.0)
MCH: 28.5 pg (ref 26.0–34.0)
MCHC: 32.8 g/dL (ref 30.0–36.0)
MCV: 87 fL (ref 80.0–100.0)
Platelets: 145 10*3/uL — ABNORMAL LOW (ref 150–400)
RBC: 4.7 MIL/uL (ref 4.22–5.81)
RDW: 13.4 % (ref 11.5–15.5)
WBC: 7.1 10*3/uL (ref 4.0–10.5)
nRBC: 0 % (ref 0.0–0.2)

## 2021-09-05 LAB — PROTIME-INR
INR: 1.1 (ref 0.8–1.2)
Prothrombin Time: 14 seconds (ref 11.4–15.2)

## 2021-09-05 LAB — HEMOGLOBIN A1C
Hgb A1c MFr Bld: 6.2 % — ABNORMAL HIGH (ref 4.8–5.6)
Mean Plasma Glucose: 131.24 mg/dL

## 2021-09-05 LAB — GLUCOSE, CAPILLARY
Glucose-Capillary: 140 mg/dL — ABNORMAL HIGH (ref 70–99)
Glucose-Capillary: 113 mg/dL — ABNORMAL HIGH (ref 70–99)
Glucose-Capillary: 161 mg/dL — ABNORMAL HIGH (ref 70–99)
Glucose-Capillary: 148 mg/dL — ABNORMAL HIGH (ref 70–99)
Glucose-Capillary: 129 mg/dL — ABNORMAL HIGH (ref 70–99)

## 2021-09-05 LAB — VALPROIC ACID LEVEL: Valproic Acid Lvl: 100 ug/mL (ref 50.0–100.0)

## 2021-09-05 MED ORDER — SODIUM CHLORIDE 0.9 % IV SOLN
INTRAVENOUS | Status: DC | PRN
Start: 2021-09-05 — End: 2021-09-09

## 2021-09-05 MED ORDER — TAMSULOSIN HCL 0.4 MG PO CAPS
0.4000 mg | ORAL_CAPSULE | Freq: Every day | ORAL | Status: DC
  Administered 2021-09-05 – 2021-09-09 (×5): 0.4 mg via ORAL
  Filled 2021-09-05 (×5): qty 1

## 2021-09-05 MED ORDER — SULFAMETHOXAZOLE-TRIMETHOPRIM 800-160 MG PO TABS
1.0000 | ORAL_TABLET | Freq: Two times a day (BID) | ORAL | Status: DC
  Administered 2021-09-05 – 2021-09-08 (×6): 1 via ORAL
  Filled 2021-09-05 (×6): qty 1

## 2021-09-05 MED ORDER — ATORVASTATIN CALCIUM 20 MG PO TABS
40.0000 mg | ORAL_TABLET | Freq: Every day | ORAL | Status: DC
  Administered 2021-09-05 – 2021-09-08 (×4): 40 mg via ORAL
  Filled 2021-09-05 (×4): qty 2

## 2021-09-05 MED ORDER — IOHEXOL 350 MG/ML SOLN
75.0000 mL | Freq: Once | INTRAVENOUS | Status: AC | PRN
  Administered 2021-09-05: 75 mL via INTRAVENOUS

## 2021-09-05 NOTE — Assessment & Plan Note (Signed)
Patient do desaturate while sleeping but refusing CPAP. ?-Use supplemental oxygen while sleeping ?-Keep counseling about using CPAP ?

## 2021-09-05 NOTE — Hospital Course (Addendum)
Taken from H&P and prior notes. ? ?Daniel [name] is a 31 y.o. male with medical history significant for  schizoaffective d/o , conversion disorder, HLD, a fib on eliquis, seizure disorder, GERD who presents via EMS from Group home on account of an episode of syncope. Patient denied hitting heis head. Incidentally, patient was recently seen and evaluated on 08/28/2021 on account of similar symptoms and was worked up with [place] and MRI.  He was again seen on 08/31/2021 in the ED for urinary tract infection and found to have nonobstructing right kidney stone.  He was discharged home on cefpodoxime.  He presented today on account of dysuria and an episode of syncope. Symptoms were preceded by lightheadedness.  Denied any associated nausea or vomiting.   ?In the ED, patient complained of lower abdominal discomfort.  Bladder scan done in the ED did reveal greater than 700 cc of urine.  Attempts to catheterize patient in the ED by nurses were unsuccessful.  Urology was hence consulted to help with Foley catheter placement.  Patient's caretaker [name] however declined for procedure hence conservative management with watchful waiting was advised.  Whilst in the ED, patient was able to void 300 cc of urine with some difficulty.  Postvoid bladder scan shows 700 cc of urine.  He feels much better. ? ?Overnight patient developed acute left-sided weakness and having some altered mental status.  Code stroke was called and neurology was consulted.  MRI brain was without any acute abnormality.  CTA head and neck was negative for any large vessel occlusion. ?Symptoms resolved quickly.  Patient has an history of seizure disorder but no witnessed seizure. ?Valproic level were checked and they were 100 which were therapeutic. ?Neurology was advising to add atorvastatin 40 mg daily. ?No antiplatelet as patient is on Eliquis for history of atrial fibrillation. ?He was started on atorvastatin after checking CMP and normal liver  function. ?Patient also had renal ultrasound which shows a 9 mm nonobstructive renal stone. ? ?4/9: All neurologic deficit has been resolved.  Patient was having difficulty voiding and was complaining of lower abdominal pain.  We checked postvoid bladder scan which shows more than 400 residual.  Urology was again consulted. ?Patient was also started on ciprofloxacin for concern of UTI, UA was not very impressive and urine cultures pending. ? ?4/10: Patient continued to complain about lower abdominal pain, postvoid bladder scan with more than 500 cc of urine.  Lower abdominal discomfort most likely secondary to urinary retention.  Urology wants to continue with conservative management and Flomax at this time. ?Plan was to discharge him to have a close outpatient urology follow-up but patient is refusing discharge before fixing this urinary problem.  Another message sent to urology. ?Urine cultures remain pending. ? ?4/11: Patient is going to the OR with urology for cystoscopy and urethral dilatation and Foley catheter placement under anesthesia.  He did experience gross hematuria after failed multiple attempts with urology yesterday.  Continues to have lower abdominal pain. ? ?4/12: Patient had his cystoscopy and urethral dilatation with urology yesterday.  Foley catheter was placed and plan was to keep it in for 7 to 10 days with outpatient urology follow-up for further management. ?Patient developed RVR this morning, difficult to control even with his higher dose of Cardizem and increase in metoprolol from 50 to 75 mg twice daily.  He was transferred to stepdown and started on amiodarone bolus followed by infusion.  Cardiology was also consulted. ? ?4/13: Cardiology started him on digoxin  and increase the dose of metoprolol resulted in controlled ventricular rate.  Patient has been evaluated by EP in March 2023 and was not a candidate for any other antiarrhythmic drugs.  It was thought to be due to obstructive  sleep apnea with obesity and hypoventilation syndrome which needs to be addressed before proceeding for any cardioversion.  He did need to have a sleep study completed by PCP as soon as possible and use CPAP or BiPAP as advised.  Per cardiology they can attempt cardioversion after addressing the sleep issues in about a month.  Patient should remain on anticoagulation during that time. ?Patient need to have a close follow-up with cardiology for close monitoring and further recommendations. ? ?Patient is morbidly obese.  He was counseled extensively but will need help from primary care doctor and a possible intervention to lose weight.  Primary care doctor can refer him to a obesity/weight reduction programs for further help. ? ?Had a long discussion with mother and apparently he is having urinary issues since childhood.  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  No follow-ups and that issue was never addressed.  Hopefully this current urethral dilatation helps with his chronic urinary retention and decrease the frequency of UTIs.  Patient is being discharged on Cipro for 7 days per susceptibility results. ?Patient is being discharged with Foley catheter and will need a follow-up with urology in 7 to 10 days for removal. ? ?Patient also developed hyperkalemia so Bactrim was discontinued, received Lokelma and potassium was 5.2 which was borderline high, he will continue with [facility] and need to have a potassium checked in 2 days and once below 5, Lokelma can be discontinued. ? ?Patient was also found to have A1c of 6.2 which makes him prediabetic.  Patient had a very high carb eating habits which need to be addressed and will be managed by primary care provider as an outpatient. ? ?Patient also need to have a close follow-up with his psychiatrist to address underlying psych issues for a healthier lifestyle. ? ?Patient will continue on current management and follow-up with his  providers. ? ?

## 2021-09-05 NOTE — Significant Event (Signed)
Rapid Response Event Note  ? ?Reason for Call : Code stroke  ? ? ?Initial Focused Assessment:  ?Laying in bed, eyes open, respirations unlabored, vital signs stable.  ? ? ? ? ?Interventions: Notified [facility] nurse of code stroke activation, CTA of head and neck negative. ? ? ?Plan of Care: continue to monitor  ? ? ? ?Event Summary:  ? ?MD Notified: Manuela Schwartz ?Call Time:0102 ?Arrival Time:0105 ?End [id number] ? ?Barnie Alderman, RN ?

## 2021-09-05 NOTE — Consult Note (Signed)
[facility] ?[facility] Consult Services ? ? ?Patient Name:   Daniel [name], Daniel [name] ?Date of Birth:   24-Jun-1990 ?Identification Number:   MRN - [id number] ?Date of Service:   09/05/2021 01:35:39 ? ?Diagnosis: ?      R53.1 - Weakness ? ?Impression: ?     This is a 31 y.o. male with history of bipolar disorder, conversion disorder with left sided weakness events in the past, a.fib on Eliquis, history of seizures, schizoaffective disorder, who was admitted for AMS and lethargy and syncope found to have a UTI. The evening of 4/8 he went to the bathroom unwitnessed, and then after was found to be more lethargic than usual and flaccid on the left. A stat CTH and CTA head and neck were done and all unremarkable. ?  ? On Exam NIHSS is 13 for left flaccidity. ?  ? There are components on his exam that suggest possible functional etiology. Indeed, he has had similar events of left weakness that have been conversion disorder. CTH/CTA were unremarkable, which also supports this possibility. However, this should be a diagnosis of exclusion and MRI should be obtained to rule out stroke, especially with known a.fib. he was not a tPA/TNK candidate due to Eliquis use. Could consider unwitnessed seizure with postictal state on differential, so obtain depakote level to ensure therapeutic. ? ?Recommendations: ?      Repeat Depakote level ?      Goal level is ~80 (50-100) ?      If level is low, increase depakote to meet the goal. Pharmacy can assist in dosing. In the meantime, can continue the same home dosing. ? ?      MRI Brain without contrast ? ?      Can continue home Eliquis for now. ?      Atorvastatin 40 mg daily ?      If acute stroke on MRI, obtain TTE. ? ?Per facility request will defer further work up, management, and referrals to inpatient service, inclusive of inpatient neurology consult. ? ?Sign Out: ?      Discussed with Rapid Response  Team ? ?------------------------------------------------------------------------------ ? ?Advanced Imaging: ?CTA Head and Neck Completed. ?LVO:No ?Patient doesn't meet criteria for emergent NIR consideration ? ? ? ? ? ? ? ?Metrics: ?Last Known Well: 09/04/2021 22:28:00 ?[facility] Notification Time: 09/05/2021 01:35:39 ?Stamp Time: 09/05/2021 01:35:39 ?Initial Response Time: 09/05/2021 02:13:39 ?Symptoms: left weakness, slurred speech. [name] ?NIHSS Start Assessment Time: 09/05/2021 01:36:24 ?Patient is not a candidate for Thrombolytic. ?Thrombolytic Medical Decision: 09/05/2021 01:39:00 ?Patient was not deemed candidate for Thrombolytic because of following reasons: ?Use of NOAs within 48 hours. ? ?I personally Reviewed the CT Head and it Showed no acute pathology, no hemorrhage. ? ?ED Physician not notified of diagnostic impression and management plan because inpatient alert ? ? ? ?------------------------------------------------------------------------------ ? ?History of Present Illness: ?Patient is a 31 year old Male. ? ?Inpatient stroke alert was called for symptoms of left weakness, slurred speech. [name] ?This is a 31 y.o. male with history of bipolar disorder, conversion disorder with left sided weakness events in the past, a.fib on Eliquis, history of seizures, schizoaffective disorder, who was admitted for AMS and lethargy and syncope found to have a UTI. The evening of 4/8 he went to the bathroom unwitnessed, and then after was found to be more lethargic than usual and flaccid on the left. A stat CTH and CTA head and neck were done and all unremarkable. ? ?On Exam NIHSS is 13 for left flaccidity. ? ? ? ?  Past Medical History: ?     Atrial Fibrillation ?     Seizures ?Othere PMH:  bipolar disorder, conversion disorder with left sided weakness events in the past, a.fib on Eliquis, history of seizures, schizoaffective disorder, ? ?Medications: ? ?Anticoagulant use:  Yes Eliquis BID ?No Antiplatelet use ?Reviewed  EMR for current medications ? ?Allergies:  ?Reviewed ? ?Social History: ?Drug Use: No ? ?Family History: ? ?There is no family history of premature cerebrovascular disease pertinent to this consultation ? ?ROS : ?14 Points Review of Systems was performed and was negative except mentioned in HPI. ? ?Past Surgical History: ?There Is No Surgical History Contributory To Today?s Visit ? ? ? ?Examination: ?BP(125/72), Pulse(89), ?1A: Level of Consciousness - Alert; keenly responsive + 0 ?1B: Ask Month and Age - Both Questions Right + 0 ?1C: Blink Eyes & Squeeze Hands - Performs Both Tasks + 0 ?2: Test Horizontal Extraocular Movements - Normal + 0 ?3: Test Visual Fields - No Visual Loss + 0 ?4: Test Facial Palsy (Use Grimace if Obtunded) - Partial paralysis (lower face) + 2 ?5A: Test Left Arm Motor Drift - No Movement + 4 ?5B: Test Right Arm Motor Drift - No Drift for 10 Seconds + 0 ?6A: Test Left Leg Motor Drift - No Effort Against Gravity + 3 ?6B: Test Right Leg Motor Drift - No Drift for 5 Seconds + 0 ?7: Test Limb Ataxia (FNF/Heel-Shin) - No Ataxia + 0 ?8: Test Sensation - Complete Loss: Cannot Sense Being Touched At All + 2 ?9: Test Language/Aphasia - Normal; No aphasia + 0 ?10: Test Dysarthria - Severe Dysarthria: Unintelligble Slurring or Out of Proportion to Aphasia + 2 ?11: Test Extinction/Inattention - No abnormality + 0 ? ?NIHSS Score: 13 ? ?NIHSS Free Text : During speech does not have left facial droop, but when asked to smile he does. Additionally, left arm is flaccid, but when held above face and dropped he moved it away from face as to avoid hitting face. ? ?Pre-Morbid Modified Rankin Scale: ?0 Points = No symptoms at all ? ? ?Patient/Family was informed the Neurology Consult would occur via TeleHealth consult by way of interactive audio and video telecommunications and consented to receiving care in this manner. ? ? ?Patient is being evaluated for possible acute neurologic impairment and high probability  of imminent or life-threatening deterioration. I spent total of 40 minutes providing care to this patient, including time for face to face visit via telemedicine, review of medical records, imaging studies and discussion of findings with providers, the patient and/or family. ? ? ?Dr Flonnie Overman ? ? ?[facility] ?[phone] ? ? ?Case [id number] ? ?

## 2021-09-05 NOTE — Assessment & Plan Note (Signed)
TIA cannot be ruled out but all of the deficit has been resolved. ?Patient also has an history of conversion disorder. ?MRI brain and CTA head and neck negative. ?Neurology was consulted and he was started on on Lipitor, liver functions were normal. ?No antiplatelet as patient is on Eliquis. ?-Continue to monitor ?

## 2021-09-05 NOTE — Progress Notes (Signed)
HR 90-110's A.fib.  Per CCMD earlier HR was 120's-130's.  Patient is asleep, does not want to use the CPAP and desats to 87% on RA. 3L O2 per [place] applied with O2 sats 95%. Per MD to give scheduled Metoprolol and Cardizem.  ?

## 2021-09-05 NOTE — Assessment & Plan Note (Signed)
Estimated body mass index is 46.8 kg/m? as calculated from the following: ?  Height as of this encounter: 6' 5" (1.956 m). ?  Weight as of this encounter: 179 kg.  ? ?-This will complicate overall prognosis. ?

## 2021-09-05 NOTE — Consult Note (Signed)
?  ? ?Urology Consult  ? ?I have been asked to see the patient by Dr. Soundra Pilon, for evaluation and management of UTI, difficulty urinating. ? ?Chief Complaint: Dysuria, difficulty urinating ? ?HPI:  ?Daniel [name] is a 31 y.o. year old with extensive psychiatric history as well as conversion disorder and numerous hospitalizations over the last few months-primarily for psych related issues and questionable syncopal/seizure episodes.  The history is obtained from the patient as well as from his legal guardian [name] over the phone.  She denies that he has had any prior urologic surgeries or any history of urethral stricture disease, however the patient reports a urologist " told him he had a narrow urethra" but no procedures or surgery was performed.  He has never needed a catheter previously. ? ?He was also in the ED on 08/31/2021 with dysuria and some urinary frequency, and urinalysis was suspicious for UTI with 11-20 RBCs, greater than 50 WBCs, many bacteria, and culture ultimately grew 40k diphtheroids and 20k Staphylococcus simulans.  A renal/bladder ultrasound was performed at that time and was benign with no hydronephrosis or bladder distention.  Renal function is normal.  He was discharged on cefpodoxime which would not cover the Staphylococcus.  He returned to the ER yesterday with a complaint of a possible syncopal episode, but had no urinary complaints at that time.  The ER provider attempted to place multiple large-bore catheters for his questionable history of hematuria, and reportedly met resistance.  He was able to void spontaneously and was admitted for possible UTI as well as further work-up of his syncopal episode.  Please see my note from yesterday evening regarding my discussion with his legal guardian who deferred catheter placement at that time.  He was voiding volumes of ~300 mL, and PVR last night was~555m. ? ?He continues to have some dysuria and difficulty voiding today with some weak  stream.  He is voiding volumes of 300 to 400 mL, with PVR this afternoon of 400 mL.  He has had over 1700 mL urine output since last night that he has voided spontaneously.  Urine is currently yellow, and I was able to observe this at the bedside today, and he denies any hematuria or clots. ? ?PMH: ?Past Medical History:  ?Diagnosis Date  ? Atrial fibrillation ([place]   ? Current use of long term anticoagulation   ? Diabetes mellitus type 2, uncomplicated ([place]   ? Hyperlipidemia   ? Hypertension   ? OSA (obstructive sleep apnea)   ? TIA (transient ischemic attack)   ? ? ?Allergies:  ?Allergies  ?Allergen Reactions  ? Haldol [Haloperidol] Other (See Comments)  ?  SI  ? Abilify [Aripiprazole] Palpitations  ? Demerol [Meperidine Hcl] Hives  ? ? ? ?Social History:  reports that he has quit smoking. His smoking use included cigarettes. He has never used smokeless tobacco. He reports that he does not currently use alcohol. He reports that he does not use drugs. ? ?ROS: ?Negative aside from those stated in the HPI. ? ?Physical Exam: ?BP 132/63 (BP Location: Right Arm)   Pulse 84   Temp 98.5 ?F (36.9 ?C)   Resp 18   Ht _0  (1.956 m)   Wt (!) 179 kg   SpO2 (!) 88%   BMI 46.80 kg/m?   ? ?Constitutional:  Alert and oriented, No acute distress. ?Cardiovascular: No clubbing, cyanosis, or edema. ?Respiratory: Normal respiratory effort, no increased work of breathing. ?GI: Abdomen is soft, nontender, nondistended, no abdominal masses ?  GU: Circumcised phallus with patent meatus, no lesions, no tenderness, minimal suprapubic tenderness ? ?Laboratory Data: ?Reviewed, see HPI in epic ? ?Assessment & Plan:   ?31 year old male with extensive psychiatric history as well as conversion disorder and numerous hospitalizations for possible syncopal/seizure episodes over the last few months.  He was diagnosed with a UTI on 08/31/2021 and discharged on antibiotics that were not appropriate for what his urine culture ultimately grew.  A  renal/bladder ultrasound at that time was normal.  Multiple catheters(24 [place] three-way, 20 [place] three-way, 16 coud? all by RN) were attempted yesterday in the ER for unclear reasons, and it sounds like maybe they were concerned about clot retention, but he has not had any hematuria over the last 48 hours. ? ?He currently is voiding spontaneously, with PVRs ~300-41m.  He is having some dysuria, but no gross hematuria or clots. ? ?We discussed possible etiologies including UTI, urethral stricture, prostatitis.  He is a very challenging historian, and unclear if he truly has a history of stricture disease, or if his symptoms are after the multiple catheter attempts that were unsuccessful secondary to him bearing down potentially during catheter placement.  There was no complaint this hospitalization of any urinary symptoms until multiple catheters were tried in the ER yesterday. ? ?He continues to void spontaneously and has made almost 2 L of urine that he has voided spontaneously over the last 18 hours.  His PVRs are not unreasonable, and he has no hydronephrosis and renal function remains normal.  We discussed options at length including observation versus further attempted catheter placement.  We discussed that if he does truly have a history of urethral stricture, or if he has some urethral trauma from his multiple catheter attempts in the ER yesterday, this could potentially be unsuccessful or even worsen his voiding.  This could potentially result in need for bedside cystoscopy or even suprapubic tube placement.  Risk and benefits discussed extensively, and he would like to proceed with observation.  I think this is reasonable with his mildly elevated PVRs, no hydronephrosis on ultrasound, normal renal function, and the fact that he continues to make good urine output. ? ? ?Recommendations: ?-Recommend antibiotics like Bactrim or Cipro for his positive urine culture from 08/31/2021, 10 to 14-day  duration ?-Okay to add Flomax, though low suspicion for prostate issue-if this is prostatitis may improve his voiding ?-Recommend Azo(Pyridium) for dysuria, this will cause orange discoloration of the urine ?-Urology will continue to follow closely ?-We will also arrange follow-up clinic visit and possible cystoscopy in 1 to 2 weeks once infection treated ? ?BBilley Co MD ? ?Total time spent on the floor was 80 minutes, with greater than 50% spent in counseling and coordination of care with the patient regarding urinary symptoms, UTI, possible urethral stricture/prostatitis and treatment options. ? ?[place]ld [place]?[address] ?[place] [address]?[phone] ? ?

## 2021-09-05 NOTE — Assessment & Plan Note (Signed)
Patient at time going in and out of RVR.  Clinically stable. ?-Continue with home dose of Cardizem, metoprolol and Eliquis. ?

## 2021-09-05 NOTE — Progress Notes (Addendum)
Code stroke activated 0107 ?NP at bedside at [place] ?ROS 0052 ?LKW 4.8.2023 22:28 ?[place] on camera at 0218 and advanced imaging and NCCT images reviewed ?

## 2021-09-05 NOTE — Significant Event (Signed)
Code stroke called on Evan overnight. See other providers notes for additional details ? ?Telemetry called Nurse, adult that HR was 150-160's. Writer went to assess pt and found him in restroom  ?Evan complained of headache, dizziness, blurred vision, and increasing weakness in left side with overall feeling of malise. Back to bed with assist x2 and wheelchair transport from restroom ?While in bed symptoms worsened. Reported unable to move left upper and lower extremities beyond a flinch. Disoriented x4. Reported double and blurred vision. SOB with SpO2 at 98%.  ?Consulting civil engineer at bedside with Clinical research associate.  ?VS obtained. VSS ?NIHSS 14  ?Code stroke called ?ICU charge RN, Neurosurgeon, Consulting civil engineer, and on-call provider at bedside. Neurotele RN via video on neuro-stick.  ?Clayburn Pert has history of seizure, no seizure activity noted while staff was in the room ?No improvement in symptoms, VSS ?Labs and CTA ordered and completed ?Neurology provider via video on neuro-stick.  ?CTA results provided - negative  ?Additional recommendations in provider note and ordered by on-call provider ?Will continue to monitor  ? ?

## 2021-09-05 NOTE — Assessment & Plan Note (Signed)
Unclear etiology, may be vasovagal as patient did feel some dizziness before that episode.  EKG without any acute changes, troponin remain negative, echocardiogram with normal good imaging due to body habitus but did show low normal EF, indeterminate diastolic function or regional wall motion abnormalities. ?No significant abnormality noted. ?CT head and MRI was negative for any acute abnormality. ?-Continue to monitor ?

## 2021-09-05 NOTE — Progress Notes (Signed)
Cross Cover ?Code stroke called by care nurse for left sided weakness, altered mental status. Exam confirmed same at bedside. Teleneuro consult. CTA negative for LVO. Follow up MRI ordered ?Per tele recommendations, depakote level also ordered and atorvastatin ordered, to start bedtime 4/9. CMP to check lever function am 4/9 prior to statin start ? ?

## 2021-09-05 NOTE — Assessment & Plan Note (Signed)
Seems well controlled with A1c of 6.2 ?-SSI ?

## 2021-09-05 NOTE — Progress Notes (Signed)
CCMD called and stated that patient's HR up to 150'-160's A.fib not sustaining. At that time resident was standing at the bedside to void. HR was back down to ~100 once back to the bed.  Resident voided .  Bladder scan post void showed and c/o pain with urination. Per patient he has been having pain with urination for a while now. Dr. Nelson Chimes notified of the above and will consult urology.  ?

## 2021-09-05 NOTE — Assessment & Plan Note (Signed)
Patient also has an history of conversion disorder with overutilization of medical care. ?-Continue home medications ?

## 2021-09-05 NOTE — Progress Notes (Signed)
?Progress Note ? ? ?Patient: Daniel [name] NOM:[id number] DOB: 06-17-1990 DOA: 09/04/2021     1 ?DOS: the patient was seen and examined on 09/05/2021 ?  ?Brief hospital course: ?Taken from H&P and prior notes. ? ?Daniel [name] is a 31 y.o. male with medical history significant for  schizoaffective d/o , conversion disorder, HLD, a fib on eliquis, seizure disorder, GERD who presents via EMS from Group home on account of an episode of syncope. Patient denied hitting heis head. Incidentally, patient was recently seen and evaluated on 08/28/2021 on account of similar symptoms and was worked up with CT and MRI.  He was again seen on 08/31/2021 in the ED for urinary tract infection and found to have nonobstructing right kidney stone.  He was discharged home on cefpodoxime.  He presented today on account of dysuria and an episode of syncope. Symptoms were preceded by lightheadedness.  Denied any associated nausea or vomiting.   ?In the ED, patient complained of lower abdominal discomfort.  Bladder scan done in the ED did reveal greater than 700 cc of urine.  Attempts to catheterize patient in the ED by nurses were unsuccessful.  Urology was hence consulted to help with Foley catheter placement.  Patient's caretaker [name] however declined for procedure hence conservative management with watchful waiting was advised.  Whilst in the ED, patient was able to void 300 cc of urine with some difficulty.  Postvoid bladder scan shows 700 cc of urine.  He feels much better. ? ?Overnight patient developed acute left-sided weakness and having some altered mental status.  Code stroke was called and neurology was consulted.  MRI brain was without any acute abnormality.  CTA head and neck was negative for any large vessel occlusion. ?Symptoms resolved quickly.  Patient has an history of seizure disorder but no witnessed seizure. ?Valproic level were checked and they were 100 which were therapeutic. ?Neurology was advising to add  atorvastatin 40 mg daily. ?No antiplatelet as patient is on Eliquis for history of atrial fibrillation. ?He was started on atorvastatin after checking CMP and normal liver function. ?Patient also had renal ultrasound which shows a 9 mm nonobstructive renal stone. ? ?4/9: All neurologic deficit has been resolved.  Patient was having difficulty voiding and was complaining of lower abdominal pain.  We checked postvoid bladder scan which shows more than 400 residual.  Urology was again consulted. ?Patient was also started on ciprofloxacin for concern of UTI, UA was not very impressive and urine cultures pending. ? ? ?Assessment and Plan: ?* Syncope ?Unclear etiology, may be vasovagal as patient did feel some dizziness before that episode.  EKG without any acute changes, troponin remain negative, echocardiogram with normal good imaging due to body habitus but did show low normal EF, indeterminate diastolic function or regional wall motion abnormalities. ?No significant abnormality noted. ?CT head and MRI was negative for any acute abnormality. ?-Continue to monitor ? ?Left-sided weakness ?TIA cannot be ruled out but all of the deficit has been resolved. ?Patient also has an history of conversion disorder. ?MRI brain and CTA head and neck negative. ?Neurology was consulted and he was started on on Lipitor, liver functions were normal. ?No antiplatelet as patient is on Eliquis. ?-Continue to monitor ? ?Urinary obstruction ?Patient was complaining about some dysuria and difficulty micturition.  UA was not very impressive for UTI but he was started on ciprofloxacin and urine cultures were sent. ?History of recent UTI where cultures grew diphtheroid and Staphylococcus simulans with good sensitivity and he  completed a course of antibiotics. ?Urology was also consulted as he has multiple failed attempts to place Foley in ED. continue to have urinary retention with postvoid residual more than 400 mL and having lower abdominal  discomfort. ?-Discontinue ciprofloxacin. ?-Follow-up urine cultures ? ?Chronic atrial fibrillation (HCC) ?Patient at time going in and out of RVR.  Clinically stable. ?-Continue with home dose of Cardizem, metoprolol and Eliquis. ? ?Obesity, Class III, BMI 40-49.9 (morbid obesity) (HCC) ?Estimated body mass index is 46.8 kg/m? as calculated from the following: ?  Height as of this encounter: 6\' 5"  (1.956 m). ?  Weight as of this encounter: 179 kg.  ? ?-This will complicate overall prognosis. ? ?Schizoaffective disorder, bipolar type (HCC) ?Patient also has an history of conversion disorder with overutilization of medical care. ?-Continue home medications ? ?OSA (obstructive sleep apnea) ?Patient do desaturate while sleeping but refusing CPAP. ?-Use supplemental oxygen while sleeping ?-Keep counseling about using CPAP ? ?Diabetes mellitus type 2, uncomplicated (HCC) ?Seems well controlled with A1c of 6.2 ?-SSI ? ? ?Subjective: Patient was seen and examined today.  No neurologic deficit.  He was complaining of lower abdominal pain and difficulty voiding. ? ?Physical Exam: ?Vitals:  ? 09/05/21 0616 09/05/21 0755 09/05/21 1010 09/05/21 1201  ?BP: 118/86 120/70  132/63  ?Pulse: (!) 49 77 (!) 105 84  ?Resp: 16 18  18   ?Temp: 98.1 ?F (36.7 ?C) 97.7 ?F (36.5 ?C)  98.5 ?F (36.9 ?C)  ?TempSrc:  Oral    ?SpO2: 94% 94%  (!) 88%  ?Weight:      ?Height:      ? ?General.  Morbidly obese gentleman, in no acute distress. ?Pulmonary.  Lungs clear bilaterally, normal respiratory effort. ?CV.  Regular rate and rhythm, no JVD, rub or murmur. ?Abdomen.  Soft, nontender, nondistended, BS positive. ?CNS.  Alert and oriented .  No focal neurologic deficit. ?Extremities.  No edema, no cyanosis, pulses intact and symmetrical. ?Psychiatry.  Judgment and insight appears normal. ? ?Data Reviewed: ?Prior notes, labs and imaging reviewed. ? ?Family Communication:  ? ?Disposition: ?Status is: Inpatient ?Remains inpatient appropriate because:  Severity of illness ? ? Planned Discharge Destination: Home/back to his group home ? ?DVT prophylaxis.  Eliquis ?Time spent: 45 minutes ? ?This record has been created using 11/05/21. Errors have been sought and corrected,but may not always be located. Such creation errors do not reflect on the standard of care. ? ?Author: ? , MD ?09/05/2021 1:06 PM ? ?For on call review [email or web address].  ?

## 2021-09-05 NOTE — Assessment & Plan Note (Addendum)
Patient was complaining about some dysuria and difficulty micturition.  UA was not very impressive for UTI but he was started on ciprofloxacin and urine cultures were sent. ?History of recent UTI where cultures grew diphtheroid and Staphylococcus simulans with good sensitivity and he completed a course of antibiotics. ?Urology was also consulted as he has multiple failed attempts to place Foley in ED. continue to have urinary retention with postvoid residual more than 400 mL and having lower abdominal discomfort. ?-Discontinue ciprofloxacin. ?-Follow-up urine cultures ?

## 2021-09-06 DIAGNOSIS — R55 Syncope and collapse: Secondary | ICD-10-CM | POA: Diagnosis not present

## 2021-09-06 DIAGNOSIS — I482 Chronic atrial fibrillation, unspecified: Secondary | ICD-10-CM | POA: Diagnosis not present

## 2021-09-06 DIAGNOSIS — R339 Retention of urine, unspecified: Secondary | ICD-10-CM | POA: Diagnosis not present

## 2021-09-06 DIAGNOSIS — N139 Obstructive and reflux uropathy, unspecified: Secondary | ICD-10-CM | POA: Diagnosis not present

## 2021-09-06 DIAGNOSIS — E119 Type 2 diabetes mellitus without complications: Secondary | ICD-10-CM | POA: Diagnosis not present

## 2021-09-06 LAB — BASIC METABOLIC PANEL
Anion gap: 10 (ref 5–15)
BUN: 18 mg/dL (ref 6–20)
CO2: 32 mmol/L (ref 22–32)
Calcium: 8.9 mg/dL (ref 8.9–10.3)
Chloride: 96 mmol/L — ABNORMAL LOW (ref 98–111)
Creatinine, Ser: 0.96 mg/dL (ref 0.61–1.24)
GFR, Estimated: >60 mL/min (ref >60)
Glucose, Bld: 110 mg/dL — ABNORMAL HIGH (ref 70–99)
Potassium: 4.1 mmol/L (ref 3.5–5.1)
Sodium: 138 mmol/L (ref 135–145)

## 2021-09-06 LAB — GLUCOSE, CAPILLARY
Glucose-Capillary: 111 mg/dL — ABNORMAL HIGH (ref 70–99)
Glucose-Capillary: 204 mg/dL — ABNORMAL HIGH (ref 70–99)
Glucose-Capillary: 102 mg/dL — ABNORMAL HIGH (ref 70–99)
Glucose-Capillary: 143 mg/dL — ABNORMAL HIGH (ref 70–99)

## 2021-09-06 MED ORDER — PHENAZOPYRIDINE HCL 100 MG PO TABS
100.0000 mg | ORAL_TABLET | Freq: Three times a day (TID) | ORAL | Status: DC
  Administered 2021-09-06 – 2021-09-09 (×8): 100 mg via ORAL
  Filled 2021-09-06 (×10): qty 1

## 2021-09-06 MED ORDER — MORPHINE SULFATE (PF) 2 MG/ML IV SOLN
2.0000 mg | Freq: Once | INTRAVENOUS | Status: AC
  Administered 2021-09-06: 2 mg via INTRAVENOUS
  Filled 2021-09-06: qty 1

## 2021-09-06 MED ORDER — OXYCODONE-ACETAMINOPHEN 5-325 MG PO TABS
1.0000 | ORAL_TABLET | ORAL | Status: DC | PRN
  Administered 2021-09-06: 2 via ORAL
  Filled 2021-09-06: qty 2

## 2021-09-06 MED ORDER — ONDANSETRON HCL 4 MG/2ML IJ SOLN
4.0000 mg | Freq: Once | INTRAMUSCULAR | Status: AC
  Administered 2021-09-06: 4 mg via INTRAVENOUS
  Filled 2021-09-06: qty 2

## 2021-09-06 NOTE — Assessment & Plan Note (Signed)
Patient was complaining about some dysuria and difficulty micturition.  UA was not very impressive for UTI but he was started on ciprofloxacin and urine cultures were sent. ?History of recent UTI where cultures grew diphtheroid and Staphylococcus simulans with good sensitivity and he completed a course of antibiotics. ?Urology was also consulted as he has multiple failed attempts to place Foley in ED. continue to have urinary retention with postvoid residual more than 500 mL and having lower abdominal discomfort. ?-He was started on Bactrim per urology recommendations. ?-Follow-up urine cultures ?-Urology wants him to be treated conservatively but patient is refusing discharge without fixing his urinary problem ?-Continue with Flomax-started by urology ?

## 2021-09-06 NOTE — Assessment & Plan Note (Signed)
Resolved ?TIA cannot be ruled out but all of the deficit has been resolved. ?Patient also has an history of conversion disorder. ?MRI brain and CTA head and neck negative. ?Neurology was consulted and he was started on on Lipitor, liver functions were normal. ?No antiplatelet as patient is on Eliquis. ?-Continue to monitor ?

## 2021-09-06 NOTE — Progress Notes (Signed)
?Progress Note ? ? ?Patient: Daniel [name] KAJ:[id number] DOB: [birth year] DOA: 09/04/2021     2 ?DOS: the patient was seen and examined on 09/06/2021 ?  ?Brief hospital course: ?Taken from H&P and prior notes. ? ?Zurich Carreno is a 31 y.o. male with medical history significant for  schizoaffective d/o , conversion disorder, HLD, a fib on eliquis, seizure disorder, GERD who presents via EMS from Group home on account of an episode of syncope. Patient denied hitting heis head. Incidentally, patient was recently seen and evaluated on 08/28/2021 on account of similar symptoms and was worked up with CT and MRI.  He was again seen on 08/31/2021 in the ED for urinary tract infection and found to have nonobstructing right kidney stone.  He was discharged home on cefpodoxime.  He presented today on account of dysuria and an episode of syncope. Symptoms were preceded by lightheadedness.  Denied any associated nausea or vomiting.   ?In the ED, patient complained of lower abdominal discomfort.  Bladder scan done in the ED did reveal greater than 700 cc of urine.  Attempts to catheterize patient in the ED by nurses were unsuccessful.  Urology was hence consulted to help with Foley catheter placement.  Patient's caretaker [name] however declined for procedure hence conservative management with watchful waiting was advised.  Whilst in the ED, patient was able to void 300 cc of urine with some difficulty.  Postvoid bladder scan shows 700 cc of urine.  He feels much better. ? ?Overnight patient developed acute left-sided weakness and having some altered mental status.  Code stroke was called and neurology was consulted.  MRI brain was without any acute abnormality.  CTA head and neck was negative for any large vessel occlusion. ?Symptoms resolved quickly.  Patient has an history of seizure disorder but no witnessed seizure. ?Valproic level were checked and they were 100 which were therapeutic. ?Neurology was advising to add  atorvastatin 40 mg daily. ?No antiplatelet as patient is on Eliquis for history of atrial fibrillation. ?He was started on atorvastatin after checking CMP and normal liver function. ?Patient also had renal ultrasound which shows a 9 mm nonobstructive renal stone. ? ?4/9: All neurologic deficit has been resolved.  Patient was having difficulty voiding and was complaining of lower abdominal pain.  We checked postvoid bladder scan which shows more than 400 residual.  Urology was again consulted. ?Patient was also started on ciprofloxacin for concern of UTI, UA was not very impressive and urine cultures pending. ? ?4/10: Patient continued to complain about lower abdominal pain, postvoid bladder scan with more than 500 cc of urine.  Lower abdominal discomfort most likely secondary to urinary retention.  Urology wants to continue with conservative management and Flomax at this time. ?Plan was to discharge him to have a close outpatient urology follow-up but patient is refusing discharge before fixing this urinary problem.  Another message sent to urology. ?Urine cultures remain pending ? ? ?Assessment and Plan: ?* Syncope ?Unclear etiology, may be vasovagal as patient did feel some dizziness before that episode.  EKG without any acute changes, troponin remain negative, echocardiogram with normal good imaging due to body habitus but did show low normal EF, indeterminate diastolic function or regional wall motion abnormalities. ?No significant abnormality noted. ?CT head and MRI was negative for any acute abnormality. ?-Continue to monitor ? ?Left-sided weakness ?Resolved ?TIA cannot be ruled out but all of the deficit has been resolved. ?Patient also has an history of conversion disorder. ?MRI brain and  CTA head and neck negative. ?Neurology was consulted and he was started on on Lipitor, liver functions were normal. ?No antiplatelet as patient is on Eliquis. ?-Continue to monitor ? ?Urinary obstruction ?Patient was  complaining about some dysuria and difficulty micturition.  UA was not very impressive for UTI but he was started on ciprofloxacin and urine cultures were sent. ?History of recent UTI where cultures grew diphtheroid and Staphylococcus simulans with good sensitivity and he completed a course of antibiotics. ?Urology was also consulted as he has multiple failed attempts to place Foley in ED. continue to have urinary retention with postvoid residual more than 500 mL and having lower abdominal discomfort. ?-He was started on Bactrim per urology recommendations. ?-Follow-up urine cultures ?-Urology wants him to be treated conservatively but patient is refusing discharge without fixing his urinary problem ?-Continue with Flomax-started by urology ? ?Chronic atrial fibrillation (HCC) ?Patient at time going in and out of RVR.  Clinically stable. ?-Continue with home dose of Cardizem, metoprolol and Eliquis. ? ?Obesity, Class III, BMI 40-49.9 (morbid obesity) (HCC) ?Estimated body mass index is 46.8 kg/m? as calculated from the following: ?  Height as of this encounter: 6\' 5"  (1.956 m). ?  Weight as of this encounter: 179 kg.  ? ?-This will complicate overall prognosis. ? ?Schizoaffective disorder, bipolar type (HCC) ?Patient also has an history of conversion disorder with overutilization of medical care. ?-Continue home medications ? ?OSA (obstructive sleep apnea) ?Patient do desaturate while sleeping but refusing CPAP. ?-Use supplemental oxygen while sleeping ?-Keep counseling about using CPAP ? ?Diabetes mellitus type 2, uncomplicated (HCC) ?Seems well controlled with A1c of 6.2 ?-SSI ? ? ?Subjective: Patient was feeling tired when seen today.  Stating that he did not had a good night sleep.  Continued to refuse CPAP and only using oxygen at night. ?Able to participate with PT. ? ?Physical Exam: ?Vitals:  ? 09/05/21 1201 09/05/21 1640 09/06/21 0558 09/06/21 0754  ?BP: 132/63 126/62 (!) 105/59 100/71  ?Pulse: 84 99 93 98   ?Resp: 18  16   ?Temp: 98.5 ?F (36.9 ?C) 98.6 ?F (37 ?C) 97.7 ?F (36.5 ?C) 97.9 ?F (36.6 ?C)  ?TempSrc:  Oral Axillary Oral  ?SpO2: (!) 88% 95% 95% 91%  ?Weight:      ?Height:      ? ?General.  Morbidly obese gentleman, in no acute distress. ?Pulmonary.  Lungs clear bilaterally, normal respiratory effort. ?CV.  Regular rate and rhythm, no JVD, rub or murmur. ?Abdomen.  Soft, nontender, nondistended, BS positive. ?CNS.  Alert and oriented .  No focal neurologic deficit. ?Extremities.  No edema, no cyanosis, pulses intact and symmetrical. ?Psychiatry.  Judgment and insight appears normal. ? ?Data Reviewed: ?Prior notes and labs reviewed. ? ?Family Communication: Patient has a legal guardian ? ?Disposition: ?Status is: Inpatient ?Remains inpatient appropriate because: Severity of illness ? ? Planned Discharge Destination: Group home/ALF ? ?DVT prophylaxis.  Eliquis ? ?Time spent: 40 minutes ? ?This record has been created using 11/06/21. Errors have been sought and corrected,but may not always be located. Such creation errors do not reflect on the standard of care. ? ?Author: ?Conservation officer, historic buildings, MD ?09/06/2021 2:56 PM ? ?For on call review [email or web address].  ?

## 2021-09-06 NOTE — Progress Notes (Signed)
? ?  Subjective ?Continues to void yellow urine spontaneously volumes ~39mL, no hematuria or clots ?Continues to report dysuria, mild lower abdominal tenderness ? ?Physical Exam: ?BP (!) 105/59 (BP Location: Right Arm)   Pulse 93   Temp 97.7 ?F (36.5 ?C) (Axillary)   Resp 16   Ht 6\' 5"  (1.956 m)   Wt (!) 179 kg   SpO2 95%   BMI 46.80 kg/m?   ? ?Constitutional:  Alert and oriented, No acute distress. ?Respiratory: Normal respiratory effort, no increased work of breathing. ?GI: Obese, mild suprapubic tenderness on exam ? ?Laboratory Data: ?Reviewed, renal function remains normal ? ?Assessment & Plan:   ?31 year old male with extensive psychiatric history as well as conversion disorder and numerous hospitalizations for possible syncopal/seizure episodes over the last few months.  He was diagnosed with a UTI on 08/31/2021 and discharged on antibiotics that were not appropriate for what his urine culture ultimately grew.  A renal/bladder ultrasound at that time was normal.  Multiple catheters(24 10/31/2021 three-way, 20 [place] three-way, 16 coud? all by RN) were attempted 4/8 in the ER for unclear reasons, and it sounds like maybe they were concerned about clot retention, but he has not had any hematuria over the last 48 hours. ?  ?He currently is voiding spontaneously ~50% of his bladder volumes, with PVRs ~300-496mL.  He is having some dysuria, but no gross hematuria or clots. ?  ?We discussed possible etiologies including UTI, urethral stricture, prostatitis.  He is a very challenging historian, and unclear if he truly has a history of stricture disease, or if his symptoms are after the multiple catheter attempts that were unsuccessful secondary to him bearing down potentially during catheter placement.  ? ?Continue flomax and antibiotics ?PVR pending this AM ?Urology will continue to follow ? ?80m, MD ? ? ? ?

## 2021-09-06 NOTE — TOC Initial Note (Signed)
Transition of Care (TOC) - Initial/Assessment Note  ? ? ?Patient Details  ?Name: Daniel [name] ?MRN: [id number] ?Date of Birth: [birth year] ? ?Transition of Care (TOC) CM/SW Contact:    ?Caryn Section, RN ?Phone Number: ?09/06/2021, 9:38 AM ? ?Clinical Narrative:   RNCM spoke with caregiver    ?Brown,OJ (caregiver) Other     [phone]  ?(Message left for Legal Guardian [phone] on voice mail)         ? ?As per OJ, caregiver, patient lives at [facility] at present.  Caregiver states that he has numerous concerns about patient including:  Legal Guardian is often unresponsive and is at a loss for what to do with patient due to frequent admissions, and patient requires more care than Assisted Living can provide including assisting him in the restroom and monitoring patient's heart rate due to Atrial Fibrillation. ? ?Caregiver states that patient perpetually requests hospital admissions due to fear of heart rate and he was instructed by care team not to send patient to the hospital unless heart rate is over 130.  Caregiver states that he feels uncomfortable with these instructions, but tries his best to provide patient with the best care he is able to in the Assisted Living setting. ? ?Caregiver believes patient should be placed in a higher level of care, but states he is willing to take patient back to his facility with specific instructions for care.  RNCM explained that a discussion will need to take place with legal guardian as to patient disposition.  RNCM awaiting for call back from Guardian. TOC contact information provided to Caregiver and guardian.  TOC to follow. ? ? ? ?Expected Discharge Plan: Group Home ?Barriers to Discharge: Continued Medical Work up ? ? ?Patient Goals and CMS Choice ?  ?  ?Choice offered to / list presented to : NA ? ?Expected Discharge Plan and Services ?Expected Discharge Plan: Group Home ?  ?Discharge Planning Services: CM Consult ?Post Acute Care Choice:  NA ?Living arrangements for the past 2 months: Group Home ?                ?  ?  ?  ?  ?  ?  ?  ?  ?  ?  ? ?Prior Living Arrangements/Services ?Living arrangements for the past 2 months: Group Home ?Lives with:: Facility Resident ?Patient language and need for interpreter reviewed:: Yes (no interpreter required) ?Do you feel safe going back to the place where you live?: Yes      ?Need for Family Participation in Patient Care: Yes (Comment) ?Care giver support system in place?: Yes (comment) ?  ?Criminal Activity/Legal Involvement Pertinent to Current Situation/Hospitalization: No - Comment as needed ? ?Activities of Daily Living ?Home Assistive Devices/Equipment: Cane (specify quad or straight) ?ADL Screening (condition at time of admission) ?Patient's cognitive ability adequate to safely complete daily activities?: Yes ?Is the patient deaf or have difficulty hearing?: No ?Does the patient have difficulty seeing, even when wearing glasses/contacts?: No ?Does the patient have difficulty concentrating, remembering, or making decisions?: No ?Patient able to express need for assistance with ADLs?: Yes ?Does the patient have difficulty dressing or bathing?: No ?Independently performs ADLs?: Yes (appropriate for developmental age) ?Does the patient have difficulty walking or climbing stairs?: No ?Weakness of Legs: None ?Weakness of Arms/Hands: None ? ?Permission Sought/Granted ?Permission sought to share information with : Case Manager ?Permission granted to share information with : Yes, Verbal Permission Granted ?   ? Permission  granted to share info w AGENCY: [facility] ?   ?   ? ?Emotional Assessment ?Appearance:: Appears stated age ?  ?  ?  ?Alcohol / Substance Use: Not Applicable ?Psych Involvement:  (Patient has psychiatric diagnosis) ? ?Admission diagnosis:  Syncope and collapse [R55] ?Syncope [R55] ?Inability to urinate [R33.9] ?Prostatitis, unspecified prostatitis type [N41.9] ?Patient Active  Problem List  ? Diagnosis Date Noted  ? Urinary obstruction 09/05/2021  ? Inability to urinate   ? Conversion disorder   ? Syncope 08/28/2021  ? History of gout 08/28/2021  ? Schizoaffective disorder, bipolar type (HCC) 08/14/2021  ? Chronic hypoxemic respiratory failure (HCC) 08/14/2021  ? OSA (obstructive sleep apnea)   ? Diabetes mellitus type 2, uncomplicated (HCC)   ? Left-sided weakness 08/02/2021  ? Obesity, Class III, BMI 40-49.9 (morbid obesity) (HCC) 08/02/2021  ? Chronic atrial fibrillation (HCC) 08/02/2021  ? ?PCP:  Anselm Jungling, NP ?Pharmacy:   ?[facility], [place] - [address] ?[address] ?[place] [address] ?Phone: [phone] Fax: [phone] ? ?[facility] - [place], [place] - [address] ?[address] ?[place] [address] ?Phone: [phone] Fax: [phone] ? ? ? ? ?Social Determinants of Health (SDOH) Interventions ?  ? ?Readmission Risk Interventions ? ?  09/06/2021  ?  9:33 AM  ?Readmission Risk Prevention Plan  ?Transportation Screening Complete  ?PCP or Specialist Appt within 3-5 Days Complete  ?HRI or Home Care Consult Complete  ?Social Work Consult for Recovery Care Planning/Counseling Not Complete  ?SW consult not completed comments RNCM assigned to case  ?Palliative Care Screening Not Applicable  ?Medication Review Oceanographer) Complete  ? ? ? ?

## 2021-09-06 NOTE — H&P (View-Only) (Signed)
? ?  UROLOGY PROCEDURE NOTE ? ?Indication: Incomplete emptying ? ?Please see consult note for full details of this complex patient.  31 year old male with possible history of stricture disease but no documentation, and his legal guardian is unaware of this.  He was diagnosed with a UTI 08/31/2021, and has had some persistent dysuria and weak stream.  He has continued to void yellow urine with large volumes of ~300 mL, but some persistently elevated PVRs ranging from 400 to 500 mL.  I had previously offered him attempted catheter placement previously, but as he continued to void spontaneously, he opted to continue antibiotics for UTI to see if his urination would improve.  This afternoon he has had worsening lower abdominal pain and he consented to attempted Foley at the bedside. ? ?The patient was prepped and draped in standard sterile fashion.  Lidojet was injected into the urethra.  59 [place], 12 [place], and 16 [place] coloplast catheters were attempted, but met resistance proximally.  There was a small amount of bleeding. ? ?He was able to stand and void 300 mL of clear yellow urine after my attempted catheters. ? ?We discussed options including ongoing observation, or consideration of cystoscopy with possible dilation and catheter placement in the operating room.  He is interested in proceeding with cystoscopy tomorrow in the operating room for further evaluation of suspected urethral stricture, dilation, and catheter placement. ? ? ?Plan: ?Anticipate OR tomorrow for cystoscopy, possible dilation, catheter placement ?Please make n.p.o. at midnight, hold Eliquis ? ?Nickolas Madrid, MD ?09/06/2021 ? ? ? ? ? ? ? ?

## 2021-09-06 NOTE — Assessment & Plan Note (Signed)
Patient at time going in and out of RVR.  Clinically stable. ?-Continue with home dose of Cardizem, metoprolol and Eliquis. ?

## 2021-09-06 NOTE — Evaluation (Signed)
Physical Therapy Evaluation ?Patient Details ?Name: Daniel [name] ?MRN: [id number] ?DOB: 1991/05/26 ?Today's Date: 09/06/2021 ? ?History of Present Illness ? Pt is a 31 y/o M admitted with an extensive psychiatric hx, as well as conversion disorder & numerous hospitalizations for possible syncope/seizure episodes onver the past few months. Pt was diagnosed with UTI on 08/31/21 & d/c on antibiotics. Pt presented back in the ED on 09/04/21 with c/o syncope. Pt c/o abdominal pain & bladder scan showed urine retention. Overnight pt developed acute L sided weakness & some AMS but MRI was negative & sx have now resolved. PMH: schizoaffective d/o, conversion disorder, HLD, a-fib on eliquis, seizure disorder, GERD  ?Clinical Impression ? Pt seen for PT evaluation with pt reporting he lives in a group home with a ramped entrance available to him. Pt reports he ambulates in the home without AD but uses [facility] for longer distances since having covid last year. Pt endorses falls prior to admission but with further conversation these seem to be more syncopal episodes than LOB & fall. On this date, pt is able to complete bed mobility & STS without AD without assistance. Pt ambulates 1 lap around nurses station without AD with mod I with decreased gait speed while simultaneously consuming an ice pop. Pt does not demonstrate any overt LOB. Pt still inquiring about getting a rollator 2/2 syncopal episodes & to walk longer distances "in the mall" but pt seems to be doing well from a mobility standpiont. At this time, pt does not demonstrate any further acute PT needs. PT to sign off at this time.    ?   ? ?Recommendations for follow up therapy are one component of a multi-disciplinary discharge planning process, led by the attending physician.  Recommendations may be updated based on patient status, additional functional criteria and insurance authorization. ? ?Follow Up Recommendations No PT follow up ? ?  ?Assistance Recommended at  Discharge PRN  ?Patient can return home with the following ? Assist for transportation ? ?  ?Equipment Recommendations None recommended by PT  ?Recommendations for Other Services ?    ?  ?Functional Status Assessment Patient has not had a recent decline in their functional status  ? ?  ?Precautions / Restrictions Precautions ?Precautions: None ?Restrictions ?Weight Bearing Restrictions: No  ? ?  ? ?Mobility ? Bed Mobility ?Overal bed mobility: Modified Independent ?  ?  ?  ?  ?  ?  ?  ?  ? ?Transfers ?Overall transfer level: Modified independent ?  ?Transfers: Sit to/from Stand ?Sit to Stand: Modified independent (Device/Increase time) ?  ?  ?  ?  ?  ?  ?  ? ?Ambulation/Gait ?Ambulation/Gait assistance: Modified independent (Device/Increase time) ?Gait Distance (Feet): 165 Feet ?Assistive device: None ?Gait Pattern/deviations: Decreased step length - right, Decreased step length - left, Decreased stride length ?Gait velocity: slightly decreased ?  ?  ?General Gait Details: Cuing not to hold to rail in hallway, Pt able to open & consume ice pop while ambulating ? ?Stairs ?  ?  ?  ?  ?  ? ?Wheelchair Mobility ?  ? ?Modified Rankin (Stroke Patients Only) ?  ? ?  ? ?Balance Overall balance assessment: Needs assistance ?Sitting-balance support: Feet supported ?Sitting balance-Leahy Scale: Normal ?  ?  ?Standing balance support: No upper extremity supported, During functional activity ?Standing balance-Leahy Scale: Good ?  ?  ?  ?  ?  ?  ?  ?  ?  ?  ?  ?  ?   ? ? ? ?  Pertinent Vitals/Pain Pain Assessment ?Pain Assessment: No/denies pain  ? ? ?Home Living Family/patient expects to be discharged to:: Group home ?Living Arrangements: Non-relatives/Friends;Group Home ?Available Help at Discharge: Available 24 hours/day ?Type of Home: House ?  ?  ?  ?  ?Home Layout: One level ?Home Equipment: Kasandra Knudsen - single point ?Additional Comments: ramped entrance  ?  ?Prior Function Prior Level of Function : Independent/Modified  Independent ?  ?  ?  ?  ?  ?  ?Mobility Comments: Pt reports he ambulates without AD in the home, uses [facility] for longer distances. Reports falling prior to admission but these seem to be more syncopal episodes than falls. ?  ?  ? ? ?Hand Dominance  ?   ? ?  ?Extremity/Trunk Assessment  ? Upper Extremity Assessment ?Upper Extremity Assessment: Overall WFL for tasks assessed ?  ? ?Lower Extremity Assessment ?Lower Extremity Assessment: Overall WFL for tasks assessed ?  ? ?   ?Communication  ? Communication: No difficulties  ?Cognition Arousal/Alertness: Awake/alert ?Behavior During Therapy: [facility] for tasks assessed/performed ?  ?  ?  ?  ?  ?  ?  ?  ?  ?  ?  ?  ?  ?  ?  ?  ?  ?General Comments: AxOx4, appropriate throughout session. ?  ?  ? ?  ?General Comments   ? ?  ?Exercises    ? ?Assessment/Plan  ?  ?PT Assessment Patient does not need any further PT services  ?PT Problem List   ? ?   ?  ?PT Treatment Interventions     ? ?PT Goals (Current goals can be found in the Care Plan section)  ?Acute Rehab PT Goals ?Patient Stated Goal: get a rollator, work on garden ?PT Goal Formulation: With patient ?Time For Goal Achievement: 09/20/21 ?Potential to Achieve Goals: Good ? ?  ?Frequency   ?  ? ? ?Co-evaluation   ?  ?  ?  ?  ? ? ?  ?AM-PAC PT "6 Clicks" Mobility  ?Outcome Measure Help needed turning from your back to your side while in a flat bed without using bedrails?: None ?Help needed moving from lying on your back to sitting on the side of a flat bed without using bedrails?: None ?Help needed moving to and from a bed to a chair (including a wheelchair)?: None ?Help needed standing up from a chair using your arms (e.g., wheelchair or bedside chair)?: None ?Help needed to walk in hospital room?: None ?Help needed climbing 3-5 steps with a railing? : None ?6 Click Score: 24 ? ?  ?End of Session   ?Activity Tolerance: Patient tolerated treatment well ?Patient left: with call bell/phone within reach (sitting on EOB) ?Nurse  Communication: Mobility status ?  ?  ? ?Time: [id number] ?PT Time Calculation (min) (ACUTE ONLY): 9 min ? ? ?Charges:   PT Evaluation ?$PT Eval Low Complexity: 1 Low ?  ?  ?   ? ? ?Lavone Nian, PT, DPT ?09/06/21, 1:44 PM ? ? ?Waunita Schooner ?09/06/2021, 1:42 PM ? ?

## 2021-09-06 NOTE — Progress Notes (Signed)
? ?  UROLOGY PROCEDURE NOTE ? ?Indication: Incomplete emptying ? ?Please see consult note for full details of this complex patient.  31 year old male with possible history of stricture disease but no documentation, and his legal guardian is unaware of this.  He was diagnosed with a UTI 08/31/2021, and has had some persistent dysuria and weak stream.  He has continued to void yellow urine with large volumes of ~300 mL, but some persistently elevated PVRs ranging from 400 to 500 mL.  I had previously offered him attempted catheter placement previously, but as he continued to void spontaneously, he opted to continue antibiotics for UTI to see if his urination would improve.  This afternoon he has had worsening lower abdominal pain and he consented to attempted Foley at the bedside. ? ?The patient was prepped and draped in standard sterile fashion.  Lidojet was injected into the urethra.  20 [place], 12 [place], and 16 [place] coloplast catheters were attempted, but met resistance proximally.  There was a small amount of bleeding. ? ?He was able to stand and void 300 mL of clear yellow urine after my attempted catheters. ? ?We discussed options including ongoing observation, or consideration of cystoscopy with possible dilation and catheter placement in the operating room.  He is interested in proceeding with cystoscopy tomorrow in the operating room for further evaluation of suspected urethral stricture, dilation, and catheter placement. ? ? ?Plan: ?Anticipate OR tomorrow for cystoscopy, possible dilation, catheter placement ?Please make n.p.o. at midnight, hold Eliquis ? ?Nickolas Madrid, MD ?09/06/2021 ? ? ? ? ? ? ? ?

## 2021-09-06 NOTE — Assessment & Plan Note (Signed)
Estimated body mass index is 46.8 kg/m? as calculated from the following: ?  Height as of this encounter: 6\' 5"  (1.956 m). ?  Weight as of this encounter: 179 kg.  ? ?-This will complicate overall prognosis. ?

## 2021-09-06 NOTE — TOC Progression Note (Addendum)
Transition of Care (TOC) - Progression Note  ? ? ?Patient Details  ?Name: Daniel [name] ?MRN: [id number] ?Date of Birth: [birth year] ? ?Transition of Care (TOC) CM/SW Contact  ?Pete Pelt, RN ?Phone Number: ?09/06/2021, 10:53 AM ? ?Clinical Narrative:   Patient is discharged today.  Hospitalist spoke directly to OJ, caregiver with instructions for care.  [facility] will accept patient back at this time, with these instructions.   ? ?Addendum  1439:  RNCM spoke to guardian, who states that patient is to return to Assisted Living facility upon discharge.  Guardian to contact OJ at [facility] to discuss. ? ?Patient will not be discharged today due to urinary retention.  TOC will follow. ? ?Expected Discharge Plan: Group Home ?Barriers to Discharge: Continued Medical Work up ? ?Expected Discharge Plan and Services ?Expected Discharge Plan: Group Home ?  ?Discharge Planning Services: CM Consult ?Post Acute Care Choice: NA ?Living arrangements for the past 2 months: Group Home ?                ?  ?  ?  ?  ?  ?  ?  ?  ?  ?  ? ? ?Social Determinants of Health (SDOH) Interventions ?  ? ?Readmission Risk Interventions ? ?  09/06/2021  ?  9:33 AM  ?Readmission Risk Prevention Plan  ?Transportation Screening Complete  ?PCP or Specialist Appt within 3-5 Days Complete  ?[place] or Home Care Consult Complete  ?Social Work Consult for [place] Planning/Counseling Not Complete  ?SW consult not completed comments RNCM assigned to case  ?Palliative Care Screening Not Applicable  ?Medication Review Press photographer) Complete  ? ? ?

## 2021-09-07 ENCOUNTER — Encounter
Admission: EM | Disposition: A | Discharge status: Home / Self Care | Payer: Self-pay | Source: Home / Self Care | Attending: Internal Medicine

## 2021-09-07 ENCOUNTER — Inpatient Hospital Stay: Payer: Medicaid Other | Admitting: Anesthesiology

## 2021-09-07 ENCOUNTER — Encounter: Payer: Self-pay | Admitting: Internal Medicine

## 2021-09-07 DIAGNOSIS — R55 Syncope and collapse: Secondary | ICD-10-CM | POA: Diagnosis not present

## 2021-09-07 DIAGNOSIS — I482 Chronic atrial fibrillation, unspecified: Secondary | ICD-10-CM | POA: Diagnosis not present

## 2021-09-07 DIAGNOSIS — R339 Retention of urine, unspecified: Secondary | ICD-10-CM | POA: Diagnosis not present

## 2021-09-07 DIAGNOSIS — N35912 Unspecified bulbous urethral stricture, male: Secondary | ICD-10-CM | POA: Diagnosis not present

## 2021-09-07 DIAGNOSIS — E119 Type 2 diabetes mellitus without complications: Secondary | ICD-10-CM | POA: Diagnosis not present

## 2021-09-07 HISTORY — PX: CYSTOSCOPY WITH URETHRAL DILATATION: SHX5125

## 2021-09-07 LAB — GLUCOSE, CAPILLARY
Glucose-Capillary: 102 mg/dL — ABNORMAL HIGH (ref 70–99)
Glucose-Capillary: 74 mg/dL (ref 70–99)
Glucose-Capillary: 182 mg/dL — ABNORMAL HIGH (ref 70–99)
Glucose-Capillary: 258 mg/dL — ABNORMAL HIGH (ref 70–99)

## 2021-09-07 SURGERY — CYSTOSCOPY, WITH URETHRAL DILATION
Anesthesia: General

## 2021-09-07 MED ORDER — MORPHINE SULFATE (PF) 2 MG/ML IV SOLN
2.0000 mg | Freq: Once | INTRAVENOUS | Status: AC
Start: 2021-09-07 — End: 2021-09-07
  Administered 2021-09-07: 2 mg via INTRAVENOUS
  Filled 2021-09-07: qty 1

## 2021-09-07 MED ORDER — MORPHINE SULFATE (PF) 2 MG/ML IV SOLN
2.0000 mg | Freq: Once | INTRAVENOUS | Status: AC
  Administered 2021-09-07: 2 mg via INTRAVENOUS
  Filled 2021-09-07: qty 1

## 2021-09-07 MED ORDER — VASOPRESSIN 20 UNIT/ML IV SOLN
INTRAVENOUS | Status: DC | PRN
  Administered 2021-09-07: 1 [IU] via INTRAVENOUS
  Administered 2021-09-07 (×2): 2 [IU] via INTRAVENOUS

## 2021-09-07 MED ORDER — SUCCINYLCHOLINE CHLORIDE 200 MG/10ML IV SOSY
PREFILLED_SYRINGE | INTRAVENOUS | Status: AC
  Filled 2021-09-07: qty 10

## 2021-09-07 MED ORDER — PROPOFOL 10 MG/ML IV BOLUS
INTRAVENOUS | Status: AC
  Filled 2021-09-07: qty 20

## 2021-09-07 MED ORDER — PROPOFOL 10 MG/ML IV BOLUS
INTRAVENOUS | Status: DC | PRN
  Administered 2021-09-07: 250 mg via INTRAVENOUS

## 2021-09-07 MED ORDER — MIDAZOLAM HCL 2 MG/2ML IJ SOLN
INTRAMUSCULAR | Status: DC | PRN
Start: 2021-09-07 — End: 2021-09-07
  Administered 2021-09-07: 2 mg via INTRAVENOUS

## 2021-09-07 MED ORDER — ESMOLOL HCL 100 MG/10ML IV SOLN
INTRAVENOUS | Status: AC
  Filled 2021-09-07: qty 10

## 2021-09-07 MED ORDER — METOPROLOL TARTRATE 5 MG/5ML IV SOLN
5.0000 mg | Freq: Once | INTRAVENOUS | Status: AC
  Administered 2021-09-07: 5 mg via INTRAVENOUS
  Filled 2021-09-07: qty 5

## 2021-09-07 MED ORDER — METOPROLOL TARTRATE 5 MG/5ML IV SOLN
INTRAVENOUS | Status: AC
  Filled 2021-09-07: qty 5

## 2021-09-07 MED ORDER — FENTANYL CITRATE (PF) 100 MCG/2ML IJ SOLN
INTRAMUSCULAR | Status: DC | PRN
Start: 2021-09-07 — End: 2021-09-07
  Administered 2021-09-07: 50 ug via INTRAVENOUS

## 2021-09-07 MED ORDER — FENTANYL CITRATE (PF) 100 MCG/2ML IJ SOLN
INTRAMUSCULAR | Status: AC
Start: 2021-09-07 — End: ?
  Filled 2021-09-07: qty 2

## 2021-09-07 MED ORDER — MIDAZOLAM HCL 2 MG/2ML IJ SOLN
INTRAMUSCULAR | Status: AC
  Filled 2021-09-07: qty 2

## 2021-09-07 MED ORDER — ONDANSETRON HCL 4 MG/2ML IJ SOLN
INTRAMUSCULAR | Status: AC
Start: 2021-09-07 — End: ?
  Filled 2021-09-07: qty 2

## 2021-09-07 MED ORDER — ESMOLOL HCL 100 MG/10ML IV SOLN
INTRAVENOUS | Status: DC | PRN
  Administered 2021-09-07: 10 mg via INTRAVENOUS

## 2021-09-07 MED ORDER — NOREPINEPHRINE BITARTRATE 1 MG/ML IV SOLN
INTRAVENOUS | Status: DC | PRN
  Administered 2021-09-07 (×2): 2 mL via INTRAVENOUS

## 2021-09-07 MED ORDER — INSULIN ASPART 100 UNIT/ML IJ SOLN
7.0000 [IU] | Freq: Once | INTRAMUSCULAR | Status: AC
  Administered 2021-09-07: 7 [IU] via SUBCUTANEOUS
  Filled 2021-09-07: qty 1

## 2021-09-07 MED ORDER — DEXAMETHASONE SODIUM PHOSPHATE 10 MG/ML IJ SOLN
INTRAMUSCULAR | Status: DC | PRN
  Administered 2021-09-07: 10 mg via INTRAVENOUS

## 2021-09-07 MED ORDER — DEXAMETHASONE SODIUM PHOSPHATE 10 MG/ML IJ SOLN
INTRAMUSCULAR | Status: AC
  Filled 2021-09-07: qty 1

## 2021-09-07 MED ORDER — NOREPINEPHRINE BITARTRATE 1 MG/ML IV SOLN
INTRAVENOUS | Status: AC
  Filled 2021-09-07: qty 4

## 2021-09-07 MED ORDER — ONDANSETRON HCL 4 MG/2ML IJ SOLN
INTRAMUSCULAR | Status: DC | PRN
  Administered 2021-09-07: 4 mg via INTRAVENOUS

## 2021-09-07 MED ORDER — FENTANYL CITRATE (PF) 100 MCG/2ML IJ SOLN: 25.0000 ug | INTRAMUSCULAR | Status: DC | PRN

## 2021-09-07 MED ORDER — SUCCINYLCHOLINE CHLORIDE 200 MG/10ML IV SOSY
PREFILLED_SYRINGE | INTRAVENOUS | Status: DC | PRN
  Administered 2021-09-07: 120 mg via INTRAVENOUS

## 2021-09-07 MED ORDER — LIDOCAINE HCL (CARDIAC) PF 100 MG/5ML IV SOSY
PREFILLED_SYRINGE | INTRAVENOUS | Status: DC | PRN
  Administered 2021-09-07: 100 mg via INTRAVENOUS

## 2021-09-07 MED ORDER — DEXTROSE 5 % IV SOLN: INTRAVENOUS | Status: DC

## 2021-09-07 MED ORDER — PHENYLEPHRINE HCL (PRESSORS) 10 MG/ML IV SOLN
INTRAVENOUS | Status: DC | PRN
  Administered 2021-09-07 (×4): 120 ug via INTRAVENOUS

## 2021-09-07 MED ORDER — LIDOCAINE HCL (PF) 2 % IJ SOLN
INTRAMUSCULAR | Status: AC
  Filled 2021-09-07: qty 5

## 2021-09-07 SURGICAL SUPPLY — 27 items
BAG DRN RND TRDRP ANRFLXCHMBR (UROLOGICAL SUPPLIES) ×1
BAG URINE DRAIN 2000ML AR STRL (UROLOGICAL SUPPLIES) ×2 IMPLANT
BALLN OPTILUME DCB 24X5X75 (BALLOONS)
BALLN OPTILUME DCB 30X3X75 (BALLOONS)
BALLN OPTILUME DCB 30X5X75 (BALLOONS)
BALLOON OPTILUME DCB 24X5X75 (BALLOONS) IMPLANT
BALLOON OPTILUME DCB 30X3X75 (BALLOONS) IMPLANT
BALLOON OPTILUME DCB 30X5X75 (BALLOONS) IMPLANT
CATH FOL 2WAY LX 16X5 (CATHETERS) ×2 IMPLANT
CATH FOLEY 2W COUNCIL 20FR 5CC (CATHETERS) IMPLANT
CATH FOLEY 2W COUNCIL 5CC 16FR (CATHETERS) ×1 IMPLANT
CATH SET URETHRAL DILATOR (CATHETERS) ×3 IMPLANT
ELECT REM PT RETURN 9FT ADLT (ELECTROSURGICAL) ×2
ELECTRODE REM PT RTRN 9FT ADLT (ELECTROSURGICAL) ×1 IMPLANT
GAUZE 4X4 16PLY ~~LOC~~+RFID DBL (SPONGE) ×4 IMPLANT
GLOVE SURG UNDER POLY LF SZ7.5 (GLOVE) ×2 IMPLANT
GOWN STRL REUS W/ TWL XL LVL3 (GOWN DISPOSABLE) ×2 IMPLANT
GOWN STRL REUS W/TWL XL LVL3 (GOWN DISPOSABLE) ×4
GUIDEWIRE STR DUAL SENSOR (WIRE) ×3 IMPLANT
HOLDER FOLEY CATH W/STRAP (MISCELLANEOUS) ×2 IMPLANT
MANIFOLD NEPTUNE II (INSTRUMENTS) ×2 IMPLANT
PACK CYSTO AR (MISCELLANEOUS) ×2 IMPLANT
SET CYSTO W/LG BORE CLAMP LF (SET/KITS/TRAYS/PACK) ×2 IMPLANT
SYR 30ML LL (SYRINGE) ×2 IMPLANT
WATER STERILE IRR 1000ML POUR (IV SOLUTION) ×2 IMPLANT
WATER STERILE IRR 3000ML UROMA (IV SOLUTION) ×2 IMPLANT
WATER STERILE IRR 500ML POUR (IV SOLUTION) ×2 IMPLANT

## 2021-09-07 NOTE — TOC Progression Note (Signed)
Transition of Care (TOC) - Progression Note  ? ? ?Patient Details  ?Name: Daniel [name] ?MRN: [id number] ?Date of Birth: 1991/03/02 ? ?Transition of Care (TOC) CM/SW Contact  ?Caryn Section, RN ?Phone Number: ?09/07/2021, 10:03 AM ? ?Clinical Narrative:   Patient was receiving care upon attempted visit from [facility], as he is not feeling well.  Care nurse at bedside.TOC to follow. ? ? ? ?Expected Discharge Plan: Group Home ?Barriers to Discharge: Continued Medical Work up ? ?Expected Discharge Plan and Services ?Expected Discharge Plan: Group Home ?  ?Discharge Planning Services: CM Consult ?Post Acute Care Choice: NA ?Living arrangements for the past 2 months: Group Home ?                ?  ?  ?  ?  ?  ?  ?  ?  ?  ?  ? ? ?Social Determinants of Health (SDOH) Interventions ?  ? ?Readmission Risk Interventions ? ?  09/06/2021  ?  9:33 AM  ?Readmission Risk Prevention Plan  ?Transportation Screening Complete  ?PCP or Specialist Appt within 3-5 Days Complete  ?[place] or Home Care Consult Complete  ?Social Work Consult for Recovery Care Planning/Counseling Not Complete  ?SW consult not completed comments RNCM assigned to case  ?Palliative Care Screening Not Applicable  ?Medication Review Oceanographer) Complete  ? ? ?

## 2021-09-07 NOTE — Op Note (Signed)
Preoperative diagnosis:  ?Elevated PVR ?Obstructive voiding symptoms ?Inability to place Foley catheter ? ?Postoperative diagnosis:  ?Bulbar urethral stricture ? ?Procedure: ?Cystoscopy with urethral dilation ?Complex catheter placement ? ?Surgeon: Abbie Sons, MD ? ?Anesthesia: General ? ?Complications: None ? ?Intraoperative findings:  ?~ 6 French bulbar urethral stricture ?False passage ventral bulbar urethra ? ?EBL: Minimal ? ?Specimens: None ? ?Indication: Daniel [name] is a 31 y.o. male with with an undocumented history of urethral stricture disease.  Treated for a UTI 08/31/2021.  Has been complaining of dysuria and a weak urinary stream.  PVRs ranging from 400-500 mL.  He has voided volumes of 300 mL.  Multiple attempts at catheter placement by the ED were unsuccessful.  Dr. Diamantina Providence attempted bedside catheter placement yesterday with 12, 14 and 16 [place] Coloplast catheters which were met with resistance.  He presents for cystoscopy under anesthesia and possible urethral dilation/catheter placement.  Consent has been obtained by his legal guardian. After reviewing the management options for treatment, he elected to proceed with the above surgical procedure(s). We have discussed the potential benefits and risks of the procedure, side effects of the proposed treatment, the likelihood of the patient achieving the goals of the procedure, and any potential problems that might occur during the procedure or recuperation. Informed consent has been obtained. ? ?Description of procedure: ? ?The patient was taken to the operating room and general anesthesia was induced.  BMI was 46 kg/m? and it was elected to perform flexible cystoscopy in the supine position.  He was prepped and draped.  He has been receiving antibiotic therapy since admission.  A preoperative time-out was performed.  ? ?A 16 French flexible cystoscope was lubricated and passed per urethra.  In the bulbar urethra a false passage with clotted  blood was identified ventrally just superior to this false passage a ~ 6 [place] opening was identified.  A 0.038 Sensor wire was placed and the cystoscope and through this opening which passed easily without resistance. ? ?The flexible cystoscope was removed.  The urethra was dilated with over-the-wire dilators starting at 8 [place].  Clear urine was obtained after passage of the 10 [place] dilator.  Serial dilators were continued to 20 [place]. ? ?A 16 French [place] catheter was then placed over the guidewire and advanced into the bladder with mild resistance.  Amber-colored urine was aspirated with a [place] syringe and sent for culture. ? ?The balloon was inflated with 10 mL of sterile water and the catheter was placed to gravity drainage. ? ?After anesthetic reversal he was transported to the PACU in stable condition. ? ?Recommendation: ?Indwelling Foley catheter 7-10 days ? ? ?Abbie Sons, M.D. ? ?

## 2021-09-07 NOTE — Anesthesia Procedure Notes (Signed)
Procedure Name: Intubation ?Date/Time: 09/07/2021 3:58 PM ?Performed by: Esaw Grandchild, CRNA ?Pre-anesthesia Checklist: Patient identified, Emergency Drugs available, Suction available and Patient being monitored ?Patient Re-evaluated:Patient Re-evaluated prior to induction ?Oxygen Delivery Method: Circle system utilized ?Preoxygenation: Pre-oxygenation with 100% oxygen ?Induction Type: IV induction ?Ventilation: Two handed mask ventilation required ?Laryngoscope Size: McGraph and 4 ?Grade View: Grade I ?Tube type: Oral ?Tube size: 8.0 mm ?Number of attempts: 1 ?Airway Equipment and Method: Stylet, Oral airway, Bite block and Patient positioned with wedge pillow ?Placement Confirmation: ETT inserted through vocal cords under direct vision, positive ETCO2 and breath sounds checked- equal and bilateral ?Secured at: 26 cm ?Tube secured with: Tape ?Dental Injury: Teeth and Oropharynx as per pre-operative assessment  ?Difficulty Due To: Difficulty was anticipated ? ? ? ? ?

## 2021-09-07 NOTE — Interval H&P Note (Signed)
History and Physical Interval Note: ? ?The procedure was discussed with patient and his legal guardian.  All questions were answered and they desire to proceed ?CV: RRR ?Lungs: Clear ? ?09/07/2021 ?3:39 PM ? ?Daniel [name]  has presented today for surgery, with the diagnosis of uretral stricture.  The various methods of treatment have been discussed with the patient and family. After consideration of risks, benefits and other options for treatment, the patient has consented to  Procedure(s): ?[place] (N/A) as a surgical intervention.  The patient's history has been reviewed, patient examined, no change in status, stable for surgery.  I have reviewed the patient's chart and labs.  Questions were answered to the patient's satisfaction.   ? ? ?Daniel [name] C Debora [place] ? ? ?

## 2021-09-07 NOTE — Transfer of Care (Signed)
Immediate Anesthesia Transfer of Care Note ? ?Patient: Daniel [name] ? ?Procedure(s) Performed: CYSTOSCOPY WITH URETHRAL DILATATION CATHETER PLACEMENT ? ?Patient Location: PACU ? ?Anesthesia Type:General ? ?Level of Consciousness: awake, alert  and oriented ? ?Airway & Oxygen Therapy: Patient Spontanous Breathing and Patient connected to face mask oxygen ? ?Post-op Assessment: Report given to RN, Post -op Vital signs reviewed and stable and Patient moving all extremities ? ?Post vital signs: Reviewed and stable ? ?Last Vitals:  ?Vitals Value Taken Time  ?BP 113/55 09/07/21 1645  ?Temp    ?Pulse 110 09/07/21 1654  ?Resp 22 09/07/21 1654  ?SpO2 99 % 09/07/21 1654  ?Vitals shown include unvalidated device data. ? ?Last Pain:  ?Vitals:  ? 09/07/21 1458  ?TempSrc: Temporal  ?PainSc: 7   ?   ? ?Patients Stated Pain Goal: 0 (09/06/21 2052) ? ?Complications: No notable events documented. ?

## 2021-09-07 NOTE — Progress Notes (Signed)
? ?      CROSS COVER NOTE ? ?NAME: Daniel [name] ?MRN: [id number] ?DOB : 07-02-1990 ? ? ?5 mg IV metoprolol ordered for patient report of palpitations. EKG shows AFIB w RVR HR of 116. ? ?Neomia Glass MHA, MSN, FNP-BC ?Nurse Practitioner ?[facility] ?[place] ?Pager [phone] ? ?

## 2021-09-07 NOTE — Progress Notes (Signed)
?Progress Note ? ? ?Patient: Daniel [name] [id number] DOB: [birth year] DOA: 09/04/2021     3 ?DOS: the patient was seen and examined on 09/07/2021 ?  ?Brief hospital course: ?Taken from H&P and prior notes. ? ?Daniel [name] is a 31 y.o. male with medical history significant for  schizoaffective d/o , conversion disorder, HLD, a fib on eliquis, seizure disorder, GERD who presents via EMS from Group home on account of an episode of syncope. Patient denied hitting heis head. Incidentally, patient was recently seen and evaluated on 08/28/2021 on account of similar symptoms and was worked up with [place] and MRI.  He was again seen on 08/31/2021 in the ED for urinary tract infection and found to have nonobstructing right kidney stone.  He was discharged home on cefpodoxime.  He presented today on account of dysuria and an episode of syncope. Symptoms were preceded by lightheadedness.  Denied any associated nausea or vomiting.   ?In the ED, patient complained of lower abdominal discomfort.  Bladder scan done in the ED did reveal greater than 700 cc of urine.  Attempts to catheterize patient in the ED by nurses were unsuccessful.  Urology was hence consulted to help with Foley catheter placement.  Patient's caretaker Daniel [name] however declined for procedure hence conservative management with watchful waiting was advised.  Whilst in the ED, patient was able to void 300 cc of urine with some difficulty.  Postvoid bladder scan shows 700 cc of urine.  He feels much better. ? ?Overnight patient developed acute left-sided weakness and having some altered mental status.  Code stroke was called and neurology was consulted.  MRI brain was without any acute abnormality.  CTA head and neck was negative for any large vessel occlusion. ?Symptoms resolved quickly.  Patient has an history of seizure disorder but no witnessed seizure. ?Valproic level were checked and they were 100 which were therapeutic. ?Neurology was advising to add  atorvastatin 40 mg daily. ?No antiplatelet as patient is on Eliquis for history of atrial fibrillation. ?He was started on atorvastatin after checking CMP and normal liver function. ?Patient also had renal ultrasound which shows a 9 mm nonobstructive renal stone. ? ?4/9: All neurologic deficit has been resolved.  Patient was having difficulty voiding and was complaining of lower abdominal pain.  We checked postvoid bladder scan which shows more than 400 residual.  Urology was again consulted. ?Patient was also started on ciprofloxacin for concern of UTI, UA was not very impressive and urine cultures pending. ? ?4/10: Patient continued to complain about lower abdominal pain, postvoid bladder scan with more than 500 cc of urine.  Lower abdominal discomfort most likely secondary to urinary retention.  Urology wants to continue with conservative management and Flomax at this time. ?Plan was to discharge him to have a close outpatient urology follow-up but patient is refusing discharge before fixing this urinary problem.  Another message sent to urology. ?Urine cultures remain pending. ? ?4/11: Patient is going to the OR with urology for cystoscopy and urethral dilatation and Foley catheter placement under anesthesia.  He did experience gross hematuria after failed multiple attempts with urology yesterday.  Continues to have lower abdominal pain. ? ? ? ?Assessment and Plan: ?* Syncope ?Unclear etiology, may be vasovagal as patient did feel some dizziness before that episode.  EKG without any acute changes, troponin remain negative, echocardiogram with normal good imaging due to body habitus but did show low normal EF, indeterminate diastolic function or regional wall motion abnormalities. ?No significant abnormality  noted. ?CT head and MRI was negative for any acute abnormality. ?-Continue to monitor ? ?Left-sided weakness ?Resolved ?TIA cannot be ruled out but all of the deficit has been resolved. ?Patient also has an  history of conversion disorder. ?MRI brain and CTA head and neck negative. ?Neurology was consulted and he was started on on Lipitor, liver functions were normal. ?No antiplatelet as patient is on Eliquis. ?-Continue to monitor ? ?Urinary obstruction ?Patient was complaining about some dysuria and difficulty micturition.  UA was not very impressive for UTI but he was started on ciprofloxacin and urine cultures were sent. ?History of recent UTI where cultures grew diphtheroid and Staphylococcus simulans with good sensitivity and he completed a course of antibiotics. ?Urology was also consulted as he has multiple failed attempts to place Foley in ED. continue to have urinary retention with postvoid residual more than 500 mL and having lower abdominal discomfort. ?-He was started on Bactrim per urology recommendations. ?-Follow-up urine cultures ?-Continue with Flomax-started by urology ?-Going to OR with urology later today for cystoscopy and urethral dilatation. ? ?Chronic atrial fibrillation ([place]) ?Patient at time going in and out of RVR.  Clinically stable. ?-Continue with home dose of Cardizem, metoprolol and Eliquis. ? ?Obesity, Class III, BMI 40-49.9 (morbid obesity) ([place]) ?Estimated body mass index is 46.8 kg/m? as calculated from the following: ?  Height as of this encounter: 6\' 5"  (1.956 m). ?  Weight as of this encounter: 179 kg.  ? ?-This will complicate overall prognosis. ? ?Schizoaffective disorder, bipolar type ([place]) ?Patient also has an history of conversion disorder with overutilization of medical care. ?-Continue home medications ? ?OSA (obstructive sleep apnea) ?Patient do desaturate while sleeping but refusing CPAP. ?-Use supplemental oxygen while sleeping ?-Keep counseling about using CPAP ? ?Diabetes mellitus type 2, uncomplicated ([place]) ?Seems well controlled with A1c of 6.2 ?-SSI ? ?Subjective: Patient was seen and examined today.  Continues to have lower abdominal pain.  Still having some  hematuria but it was slowly improving.  Waiting for his procedure. ? ?Physical Exam: ?Vitals:  ? 09/06/21 2040 09/07/21 0534 09/07/21 0722 09/07/21 1458  ?BP: 125/78 (!) 109/52 124/63 106/73  ?Pulse: (!) 104 96  87  ?Resp: 18 20  18   ?Temp: 97.7 ?F (36.5 ?C) 97.7 ?F (36.5 ?C) 97.9 ?F (36.6 ?C) 98.5 ?F (36.9 ?C)  ?TempSrc: Oral Oral Oral Temporal  ?SpO2: 95% 96% 92% 94%  ?Weight:    (!) 176.4 kg  ?Height:    6\' 5"  (1.956 m)  ? ?General.  Morbidly obese gentleman, in no acute distress. ?Pulmonary.  Lungs clear bilaterally, normal respiratory effort. ?CV.  Regular rate and rhythm, no JVD, rub or murmur. ?Abdomen.  Soft, nontender, nondistended, BS positive. ?CNS.  Alert and oriented x3.  No focal neurologic deficit. ?Extremities.  No edema, no cyanosis, pulses intact and symmetrical. ?Psychiatry.  Judgment and insight appears normal. ? ?Data Reviewed: ?Prior notes and labs reviewed ? ?Family Communication:  ? ?Disposition: ?Status is: Inpatient ?Remains inpatient appropriate because: Severity of illness ? ? Planned Discharge Destination: ALF ? ?Time spent: 40 minutes ? ?This record has been created using Systems analyst. Errors have been sought and corrected,but may not always be located. Such creation errors do not reflect on the standard of care. ? ?Author: ?Lorella Nimrod, MD ?09/07/2021 4:11 PM ? ?For on call review [email or web address].  ?

## 2021-09-07 NOTE — Anesthesia Postprocedure Evaluation (Signed)
Anesthesia Post Note ? ?Patient: Daniel [name] ? ?Procedure(s) Performed: CYSTOSCOPY WITH URETHRAL DILATATION CATHETER PLACEMENT ? ?Patient location during evaluation: PACU ?Anesthesia Type: General ?Level of consciousness: awake and alert, oriented and patient cooperative ?Pain management: pain level controlled ?Vital Signs Assessment: post-procedure vital signs reviewed and stable ?Respiratory status: spontaneous breathing, nonlabored ventilation and respiratory function stable ?Cardiovascular status: blood pressure returned to baseline and stable ?Postop Assessment: adequate PO intake ?Anesthetic complications: no ? ? ?No notable events documented. ? ? ?Last Vitals:  ?Vitals:  ? 09/07/21 1714 09/07/21 1738  ?BP: (!) 129/94 122/77  ?Pulse: (!) 103 93  ?Resp: 19 18  ?Temp: 36.4 ?C 37 ?C  ?SpO2: 99% 96%  ?  ?Last Pain:  ?Vitals:  ? 09/07/21 1714  ?TempSrc:   ?PainSc: 0-No pain  ? ? ?  ?  ?  ?  ?  ?  ? ?Reed Breech ? ? ? ? ?

## 2021-09-07 NOTE — Assessment & Plan Note (Signed)
Patient at time going in and out of RVR.  Clinically stable. ?-Continue with home dose of Cardizem, metoprolol and Eliquis. ?

## 2021-09-07 NOTE — Assessment & Plan Note (Signed)
Patient was complaining about some dysuria and difficulty micturition.  UA was not very impressive for UTI but he was started on ciprofloxacin and urine cultures were sent. ?History of recent UTI where cultures grew diphtheroid and Staphylococcus simulans with good sensitivity and he completed a course of antibiotics. ?Urology was also consulted as he has multiple failed attempts to place Foley in ED. continue to have urinary retention with postvoid residual more than 500 mL and having lower abdominal discomfort. ?-He was started on Bactrim per urology recommendations. ?-Follow-up urine cultures ?-Continue with Flomax-started by urology ?-Going to OR with urology later today for cystoscopy and urethral dilatation. ?

## 2021-09-07 NOTE — Anesthesia Preprocedure Evaluation (Signed)
Anesthesia Evaluation  ?Patient identified by MRN, date of birth, ID band ?Patient awake ? ? ? ?Reviewed: ?Allergy & Precautions, H&P , NPO status , Patient's Chart, lab work & pertinent test results, reviewed documented beta blocker date and time  ? ?History of Anesthesia Complications ?Negative for: history of anesthetic complications ? ?Airway ?Mallampati: II ? ?TM Distance: >3 FB ?Neck ROM: full ? ? ? Dental ? ?(+) Dental Advidsory Given, Poor Dentition ?  ?Pulmonary ?neg pulmonary ROS, former smoker,  ?  ?Pulmonary exam normal ?breath sounds clear to auscultation ? ? ? ? ? ? Cardiovascular ?Exercise Tolerance: Good ?hypertension, (-) angina(-) Past MI and (-) Cardiac Stents + dysrhythmias Atrial Fibrillation (-) Valvular Problems/Murmurs ?Rhythm:regular Rate:Normal ? ? ?  ?Neuro/Psych ?PSYCHIATRIC DISORDERS Bipolar Disorder Schizophrenia negative neurological ROS ?   ? GI/Hepatic ?negative GI ROS, Neg liver ROS,   ?Endo/Other  ?neg diabetesMorbid obesity ? Renal/GU ?negative Renal ROS  ?negative genitourinary ?  ?Musculoskeletal ? ? Abdominal ?  ?Peds ? Hematology ?negative hematology ROS ?(+)   ?Anesthesia Other Findings ?Past Medical History: ?No date: Atrial fibrillation (HCC) ?No date: Current use of long term anticoagulation ?No date: Diabetes mellitus type 2, uncomplicated (HCC) ?No date: Hyperlipidemia ?No date: Hypertension ?No date: OSA (obstructive sleep apnea) ?No date: TIA (transient ischemic attack) ? ? Reproductive/Obstetrics ?negative OB ROS ? ?  ? ? ? ? ? ? ? ? ? ? ? ? ? ?  ?  ? ? ? ? ? ? ? ? ?Anesthesia Physical ?Anesthesia Plan ? ?ASA: 3 ? ?Anesthesia Plan: General  ? ?Post-op Pain Management:   ? ?Induction: Intravenous ? ?PONV Risk Score and Plan: 2 and Ondansetron, Dexamethasone, Midazolam and Treatment may vary due to age or medical condition ? ?Airway Management Planned: LMA ? ?Additional Equipment:  ? ?Intra-op Plan:  ? ?Post-operative Plan:  ? ?Informed  Consent: I have reviewed the patients History and Physical, chart, labs and discussed the procedure including the risks, benefits and alternatives for the proposed anesthesia with the patient or authorized representative who has indicated his/her understanding and acceptance.  ? ? ? ?Dental Advisory Given ? ?Plan Discussed with: Anesthesiologist, CRNA and Surgeon ? ?Anesthesia Plan Comments:   ? ? ? ? ? ? ?Anesthesia Quick Evaluation ? ?

## 2021-09-08 ENCOUNTER — Encounter: Payer: Self-pay | Admitting: Urology

## 2021-09-08 DIAGNOSIS — E119 Type 2 diabetes mellitus without complications: Secondary | ICD-10-CM | POA: Diagnosis not present

## 2021-09-08 DIAGNOSIS — E875 Hyperkalemia: Secondary | ICD-10-CM

## 2021-09-08 DIAGNOSIS — I482 Chronic atrial fibrillation, unspecified: Secondary | ICD-10-CM

## 2021-09-08 DIAGNOSIS — I4892 Unspecified atrial flutter: Secondary | ICD-10-CM

## 2021-09-08 DIAGNOSIS — R531 Weakness: Secondary | ICD-10-CM | POA: Diagnosis not present

## 2021-09-08 DIAGNOSIS — R55 Syncope and collapse: Secondary | ICD-10-CM | POA: Diagnosis not present

## 2021-09-08 LAB — BASIC METABOLIC PANEL
Anion gap: 10 (ref 5–15)
BUN: 19 mg/dL (ref 6–20)
CO2: 29 mmol/L (ref 22–32)
Calcium: 9.4 mg/dL (ref 8.9–10.3)
Chloride: 95 mmol/L — ABNORMAL LOW (ref 98–111)
Creatinine, Ser: 1.15 mg/dL (ref 0.61–1.24)
GFR, Estimated: >60 mL/min (ref >60)
Glucose, Bld: 180 mg/dL — ABNORMAL HIGH (ref 70–99)
Potassium: 6.6 mmol/L (ref 3.5–5.1)
Sodium: 134 mmol/L — ABNORMAL LOW (ref 135–145)

## 2021-09-08 LAB — CBC
HCT: 40.8 % (ref 39.0–52.0)
Hemoglobin: 13.3 g/dL (ref 13.0–17.0)
MCH: 28.5 pg (ref 26.0–34.0)
MCHC: 32.6 g/dL (ref 30.0–36.0)
MCV: 87.6 fL (ref 80.0–100.0)
Platelets: 157 10*3/uL (ref 150–400)
RBC: 4.66 MIL/uL (ref 4.22–5.81)
RDW: 13.3 % (ref 11.5–15.5)
WBC: 7.2 10*3/uL (ref 4.0–10.5)
nRBC: 0 % (ref 0.0–0.2)

## 2021-09-08 LAB — GLUCOSE, CAPILLARY
Glucose-Capillary: 83 mg/dL (ref 70–99)
Glucose-Capillary: 178 mg/dL — ABNORMAL HIGH (ref 70–99)
Glucose-Capillary: 204 mg/dL — ABNORMAL HIGH (ref 70–99)
Glucose-Capillary: 192 mg/dL — ABNORMAL HIGH (ref 70–99)
Glucose-Capillary: 189 mg/dL — ABNORMAL HIGH (ref 70–99)
Glucose-Capillary: 159 mg/dL — ABNORMAL HIGH (ref 70–99)

## 2021-09-08 LAB — URINE CULTURE: Culture: NO GROWTH

## 2021-09-08 LAB — MRSA NEXT GEN BY PCR, NASAL: MRSA by PCR Next Gen: NOT DETECTED

## 2021-09-08 LAB — MAGNESIUM: Magnesium: 1.8 mg/dL (ref 1.7–2.4)

## 2021-09-08 LAB — POTASSIUM: Potassium: 5.3 mmol/L — ABNORMAL HIGH (ref 3.5–5.1)

## 2021-09-08 MED ORDER — METOPROLOL TARTRATE 50 MG PO TABS
75.0000 mg | ORAL_TABLET | Freq: Two times a day (BID) | ORAL | Status: DC
  Administered 2021-09-08: 75 mg via ORAL
  Filled 2021-09-08: qty 2

## 2021-09-08 MED ORDER — SODIUM ZIRCONIUM CYCLOSILICATE 10 G PO PACK: 10.0000 g | PACK | Freq: Two times a day (BID) | ORAL | Status: DC

## 2021-09-08 MED ORDER — DILTIAZEM HCL-DEXTROSE 125-5 MG/125ML-% IV SOLN (PREMIX)
5.0000 mg/h | INTRAVENOUS | Status: DC
  Administered 2021-09-08: 5 mg/h via INTRAVENOUS
  Filled 2021-09-08: qty 125

## 2021-09-08 MED ORDER — DIGOXIN 0.25 MG/ML IJ SOLN
0.2500 mg | Freq: Once | INTRAMUSCULAR | Status: AC
  Administered 2021-09-08: 0.25 mg via INTRAVENOUS
  Filled 2021-09-08: qty 2

## 2021-09-08 MED ORDER — AMIODARONE LOAD VIA INFUSION
150.0000 mg | Freq: Once | INTRAVENOUS | Status: AC
  Administered 2021-09-08: 150 mg via INTRAVENOUS
  Filled 2021-09-08: qty 83.34

## 2021-09-08 MED ORDER — AMIODARONE HCL IN DEXTROSE 360-4.14 MG/200ML-% IV SOLN: 30.0000 mg/h | INTRAVENOUS | Status: DC

## 2021-09-08 MED ORDER — AMIODARONE HCL IN DEXTROSE 360-4.14 MG/200ML-% IV SOLN
60.0000 mg/h | INTRAVENOUS | Status: DC
Start: 2021-09-08 — End: 2021-09-08
  Administered 2021-09-08: 60 mg/h via INTRAVENOUS
  Filled 2021-09-08: qty 200

## 2021-09-08 MED ORDER — SODIUM ZIRCONIUM CYCLOSILICATE 10 G PO PACK
10.0000 g | PACK | Freq: Every day | ORAL | Status: DC
  Administered 2021-09-08 – 2021-09-09 (×2): 10 g via ORAL
  Filled 2021-09-08 (×2): qty 1

## 2021-09-08 MED ORDER — CEFTRIAXONE SODIUM 2 G IJ SOLR
2.0000 g | Freq: Every day | INTRAMUSCULAR | Status: DC
  Administered 2021-09-08 – 2021-09-09 (×2): 2 g via INTRAVENOUS
  Filled 2021-09-08 (×2): qty 20
  Filled 2021-09-08: qty 2

## 2021-09-08 MED ORDER — APIXABAN 5 MG PO TABS
5.0000 mg | ORAL_TABLET | Freq: Two times a day (BID) | ORAL | Status: DC
  Administered 2021-09-08 – 2021-09-09 (×3): 5 mg via ORAL
  Filled 2021-09-08 (×3): qty 1

## 2021-09-08 MED ORDER — KETOROLAC TROMETHAMINE 30 MG/ML IJ SOLN
30.0000 mg | Freq: Four times a day (QID) | INTRAMUSCULAR | Status: DC | PRN
  Administered 2021-09-08 – 2021-09-09 (×2): 30 mg via INTRAVENOUS
  Filled 2021-09-08 (×2): qty 1

## 2021-09-08 MED ORDER — CHLORHEXIDINE GLUCONATE CLOTH 2 % EX PADS
6.0000 | MEDICATED_PAD | Freq: Every day | CUTANEOUS | Status: DC
  Administered 2021-09-08 – 2021-09-09 (×2): 6 via TOPICAL

## 2021-09-08 MED ORDER — METOPROLOL TARTRATE 50 MG PO TABS
50.0000 mg | ORAL_TABLET | Freq: Four times a day (QID) | ORAL | Status: DC
  Administered 2021-09-08 – 2021-09-09 (×4): 50 mg via ORAL
  Filled 2021-09-08 (×4): qty 1

## 2021-09-08 NOTE — Plan of Care (Signed)
?  Problem: Education: ?Goal: Expressions of having a comfortable level of knowledge regarding the disease process will increase ?Outcome: Progressing ?  ?Problem: Coping: ?Goal: Ability to adjust to condition or change in health will improve ?Outcome: Progressing ?Goal: Ability to identify appropriate support needs will improve ?Outcome: Progressing ?  ?Problem: Health Behavior/Discharge Planning: ?Goal: Compliance with prescribed medication regimen will improve ?Outcome: Progressing ?  ?Problem: Medication: ?Goal: Risk for medication side effects will decrease ?Outcome: Progressing ?  ?Problem: Clinical Measurements: ?Goal: Complications related to the disease process, condition or treatment will be avoided or minimized ?Outcome: Progressing ?Goal: Diagnostic test results will improve ?Outcome: Progressing ?  ?Problem: Safety: ?Goal: Verbalization of understanding the information provided will improve ?Outcome: Progressing ?  ?Problem: Self-Concept: ?Goal: Ability to verbalize feelings about condition will improve ?Outcome: Progressing ?  ?Problem: Education: ?Goal: Knowledge of secondary prevention will improve (SELECT ALL) ?Outcome: Progressing ?Goal: Knowledge of patient specific risk factors will improve (INDIVIDUALIZE FOR PATIENT) ?Outcome: Progressing ?  ?Problem: Ischemic Stroke/TIA Tissue Perfusion: ?Goal: Complications of ischemic stroke/TIA will be minimized ?Outcome: Progressing ?  ?

## 2021-09-08 NOTE — Progress Notes (Signed)
Pt was transferred to rm 255. Legal Sandi Mariscal called to notify but went to voice mail. ?

## 2021-09-08 NOTE — Progress Notes (Signed)
?  Chaplain On-Call responded to a call from Transition of Care Manager Pryor Ochoa, who reported the patient's request for a Bible. ? ?Patient was moved from [facility] room 104 to the Intensive Care Unit, ICU-7. ?Patient was sleeping soundly after being transferred  there. ? ?Chaplain left the Bible on the tray table, and will refer to Afternoon Chaplain for follow-up. ? ?Chaplain Evelena Peat ?M.Div.,[facility] ?

## 2021-09-08 NOTE — Assessment & Plan Note (Signed)
Potassium during morning labs were 6.6, it was 5.3 on repeat.  No EKG changes except he went into RVR with A-fib/flutter. ?Patient was also on Bactrim which was discontinued. ?-Monitor potassium ?

## 2021-09-08 NOTE — Progress Notes (Signed)
Patient called on light stating that he was having chest pain. Once in room. I assessed patient and took his vitals. Vitals were stable but oxygen was a little low. Noticed that patient did not have his oxygen on. Tried to place oxygen back on but patient refused stating that he does not like wearing it. Educated patient. Asked patient where was his chest pain coming from and he stated that he was no longer having any pain but wanted some italian ice.  ?

## 2021-09-08 NOTE — Progress Notes (Signed)
Placed patient on bipap with humidfiication. States he wears 12/6 with 3 liters. Placed patient on settings. Tolerating well. Will continue to monitor ?

## 2021-09-08 NOTE — Consult Note (Signed)
? ?Cardiology Consult  ?  ?Patient ID: Daniel [name] ?MRN: [id number], DOB/AGE: Apr 24, 1991  ? ?Admit date: 09/04/2021 ?Date of Consult: 09/08/2021 ? ?Primary Physician: Lauretta Grill, NP ?Primary Cardiologist: Nelva Bush, MD ?Requesting Provider: S. Reesa Chew, MD ? ?Patient Profile  ?  ?Daniel [name] is a 31 y.o. male with a history of HTN, HL, sleep disordered breathing, obesity, ? TIA, low-nl LV fxn, schizoaffective d/o, sz d/o, conversion d/o, and persistent afib/flutter, who is being seen today for the evaluation of rapid atrial flutter following cystoscopy at the request of Dr. Reesa Chew. ? ?Past Medical History  ? ?Past Medical History:  ?Diagnosis Date  ? Cardiomyopathy ([place])   ? a. 07/2021 Echo: EF 50%, mild LVH, nl RV size/function. No significant valvular disease.  ? Chest pain   ? Conversion disorder   ? Current use of long term anticoagulation   ? Hyperlipidemia   ? Hypertension   ? OSA (obstructive sleep apnea)   ? Persistent atrial fibrillation and flutter ([place])   ? a. CHA2DS2VASc = 3-4  ? Schizoaffective disorder ([place])   ? Seizure disorder ([place])   ? TIA (transient ischemic attack)   ? Urethral stricture   ? a. 08/2021 s/p cystoscopy and urethral dilation.  ?  ?Past Surgical History:  ?Procedure Laterality Date  ? CYSTOSCOPY WITH URETHRAL DILATATION N/A 09/07/2021  ? Procedure: CYSTOSCOPY WITH URETHRAL DILATATION CATHETER PLACEMENT;  Surgeon: Abbie Sons, MD;  Location: [facility] ORS;  Service: Urology;  Laterality: N/A;  ?  ? ?Allergies ? ?Allergies  ?Allergen Reactions  ? Haldol [Haloperidol] Other (See Comments)  ?  SI  ? Abilify [Aripiprazole] Palpitations  ? Demerol [Meperidine Hcl] Hives  ? ? ?History of Present Illness  ?  ?31 y.o. male with a history of HTN, HL, sleep disordered breathing, obesity, ? TIA, low-nl LV fxn, schizoaffective d/o, sz d/o, conversion d/o, and persistent afib/flutter.  Patient reports of history of atrial fibrillation dating back to the age of 59.  He has been managed by  diltiazem and Eliquis and previously followed by cardiology in [place].  He does that ablation has been discussed periodically over the years but never pursued.  He has never undergone cardioversion and historically, has always had paroxysmal arrhythmias.  He was hospitalized in February due to chest pain and A-fib.  He moved to the [place] area earlier this year to live in a group home and presented to the emergency department on March 6 with chest pain and palpitations.  He was noted to be in atrial fibrillation.  Neuro work-up was unremarkable (also reported TIA symptoms).  He was seen by electrophysiology and was felt to be a poor candidate for catheter ablation secondary to obesity, nonadherence, and untreated sleep apnea.  He was subsequently discharged and advised to continue home doses of metoprolol, diltiazem, and Xarelto.  Patient did not follow-up in the outpatient setting.  He represented to the ED on March 31 with left-sided weakness as well as a fall.  He was seen by neurology.  MRI of the brain was negative.  Blood pressure was on the soft side and metoprolol was reduced to 50 mg twice daily.  He was discharged home April 2, but returned to the emergency department on April 4 secondary to urinary frequency (treated for UTI), and again on April 8, following report of syncope that occurred at church.  He also reported difficulty voiding and was noted to have 600 mL postvoid residual.  Nursing was unable to place a Foley.  He was seen by urology and placed on Bactrim DS.  He was admitted for further evaluation.  He had worsening lower abdominal discomfort on April 10 and consented to placement of a Foley catheter.  He subsequently underwent cystoscopy and urethral dilation on April 11.  Postprocedure, patient complained of significant discomfort and in the context of persistent A-fib/flutter, he began experiencing rapid atrial flutter with rates into the 140s, associated with dyspnea,  lightheadedness, and palpitations.  He was treated with intravenous metoprolol and we have been asked to evaluate. ? ?Patient was very groggy during my examination, having recently received pain medication.  Upon entering, his heart rate was in the 120s, however after awakening, his rates rose into the 140s.  He was hemodynamically stable otherwise but did complain of palpitations.  He reports compliance with Eliquis in the outpatient setting but his last dose was yesterday morning. ? ?Inpatient Medications  ?  ? allopurinol  300 mg Oral Daily  ? apixaban  5 mg Oral BID  ? atorvastatin  40 mg Oral QHS  ? chlorhexidine gluconate (MEDLINE KIT)  15 mL Mouth Rinse BID  ? Chlorhexidine Gluconate Cloth  6 each Topical Daily  ? divalproex  1,000 mg Oral q morning  ? divalproex  1,500 mg Oral QHS  ? escitalopram  20 mg Oral Daily  ? insulin aspart  0-20 Units Subcutaneous TID WC  ? loratadine  10 mg Oral Daily  ? melatonin  2.5 mg Oral QHS  ? metoprolol tartrate  50 mg Oral Q6H  ? paliperidone  9 mg Oral q morning  ? pantoprazole  40 mg Oral Daily  ? phenazopyridine  100 mg Oral TID WC  ? sodium zirconium cyclosilicate  10 g Oral Daily  ? tamsulosin  0.4 mg Oral Daily  ? ? ?Family History  ?  ?History reviewed. No pertinent family history. ?has no family status information on file.  ?No known premature cardiovascular disease. ? ?Social History  ?  ?Social History  ? ?Socioeconomic History  ? Marital status: Single  ?  Spouse name: Not on file  ? Number of children: Not on file  ? Years of education: Not on file  ? Highest education level: Not on file  ?Occupational History  ? Not on file  ?Tobacco Use  ? Smoking status: Former  ?  Types: Cigarettes  ? Smokeless tobacco: Never  ?Substance and Sexual Activity  ? Alcohol use: Not Currently  ? Drug use: Never  ? Sexual activity: Not on file  ?Other Topics Concern  ? Not on file  ?Social History Narrative  ? Now living in a group home in [place].  ? ?Social Determinants of Health   ? ?Financial Resource Strain: Not on file  ?Food Insecurity: Not on file  ?Transportation Needs: Not on file  ?Physical Activity: Not on file  ?Stress: Not on file  ?Social Connections: Not on file  ?Intimate Partner Violence: Not on file  ?  ? ?Review of Systems  ?  ?General:  No chills, fever, night sweats or weight changes.  ?Cardiovascular:  +++ intermittent chest pain, +++ dyspnea on exertion, +++ palpitations, +++ lightheadedness, +++ ? H/o syncop, +++ mild LE edema, no orthopnea, paroxysmal nocturnal dyspnea. ?Dermatological: No rash, lesions/masses ?Respiratory: No cough, +++ dyspnea ?Urologic: No hematuria, +++ dysuria, +++ ?Abdominal:   No nausea, vomiting, diarrhea, bright red blood per rectum, melena, or hematemesis ?Neurologic:  No visual changes, wkns, changes in mental status. ?All other systems reviewed and are  otherwise negative except as noted above. ? ?Physical Exam  ?  ?Blood pressure 120/69, pulse (!) 120, temperature 98.6 ?F (37 ?C), temperature source Oral, resp. rate 16, height 6' 5"  (1.956 m), weight (!) 180.4 kg, SpO2 91 %.  ?General: Pleasant, NAD ?Psych: Normal affect. ?Neuro: Alert and oriented X 3. Moves all extremities spontaneously. ?HEENT: Normal  ?Neck: Supple without bruits or JVD. ?Lungs:  Resp regular and unlabored, CTA. ?Heart: RRR, tachycardic, no s3, s4, or murmurs. ?Abdomen: Obese, soft, non-tender, non-distended, BS + x 4.  ?Extremities: No clubbing, cyanosis.  Trace to 1+ bilateral ankle edema. DP/PT2+, Radials 2+ and equal bilaterally. ? ?Labs  ?  ?Cardiac Enzymes ?Recent Labs  ?Lab 08/13/21 ?2311 08/14/21 ?0125 08/27/21 ?2317 09/04/21 ?1151 09/04/21 ?1422  ?TROPONINIHS 5 7 4 4 4   ?   ?BNP ?   ?Component Value Date/Time  ? BNP 235.4 (H) 08/13/2021 2325  ? ? ?Lab Results  ?Component Value Date  ? WBC 7.2 09/08/2021  ? HGB 13.3 09/08/2021  ? HCT 40.8 09/08/2021  ? MCV 87.6 09/08/2021  ? PLT 157 09/08/2021  ?  ?Recent Labs  ?Lab 09/05/21 ?0630 09/06/21 ?9767 09/08/21 ?0435  09/08/21 ?3419  ?NA 133*   < > 134*  --   ?K 4.7   < > 6.6* 5.3*  ?CL 95*   < > 95*  --   ?CO2 28   < > 29  --   ?BUN 17   < > 19  --   ?CREATININE 1.10   < > 1.15  --   ?CALCIUM 9.3   < > 9.4  --   ?PROT 7.1  --   --

## 2021-09-08 NOTE — Progress Notes (Addendum)
?Progress Note ? ? ?Patient: Daniel [name] WLN:[id number] DOB: 10/02/1990 DOA: 09/04/2021     4 ?DOS: the patient was seen and examined on 09/08/2021 ?  ?Brief hospital course: ?Taken from H&P and prior notes. ? ?Daniel [name] is a 31 y.o. male with medical history significant for  schizoaffective d/o , conversion disorder, HLD, a fib on eliquis, seizure disorder, GERD who presents via EMS from Group home on account of an episode of syncope. Patient denied hitting heis head. Incidentally, patient was recently seen and evaluated on 08/28/2021 on account of similar symptoms and was worked up with CT and MRI.  He was again seen on 08/31/2021 in the ED for urinary tract infection and found to have nonobstructing right kidney stone.  He was discharged home on cefpodoxime.  He presented today on account of dysuria and an episode of syncope. Symptoms were preceded by lightheadedness.  Denied any associated nausea or vomiting.   ?In the ED, patient complained of lower abdominal discomfort.  Bladder scan done in the ED did reveal greater than 700 cc of urine.  Attempts to catheterize patient in the ED by nurses were unsuccessful.  Urology was hence consulted to help with Foley catheter placement.  Patient's caretaker [name] however declined for procedure hence conservative management with watchful waiting was advised.  Whilst in the ED, patient was able to void 300 cc of urine with some difficulty.  Postvoid bladder scan shows 700 cc of urine.  He feels much better. ? ?Overnight patient developed acute left-sided weakness and having some altered mental status.  Code stroke was called and neurology was consulted.  MRI brain was without any acute abnormality.  CTA head and neck was negative for any large vessel occlusion. ?Symptoms resolved quickly.  Patient has an history of seizure disorder but no witnessed seizure. ?Valproic level were checked and they were 100 which were therapeutic. ?Neurology was advising to add  atorvastatin 40 mg daily. ?No antiplatelet as patient is on Eliquis for history of atrial fibrillation. ?He was started on atorvastatin after checking CMP and normal liver function. ?Patient also had renal ultrasound which shows a 9 mm nonobstructive renal stone. ? ?4/9: All neurologic deficit has been resolved.  Patient was having difficulty voiding and was complaining of lower abdominal pain.  We checked postvoid bladder scan which shows more than 400 residual.  Urology was again consulted. ?Patient was also started on ciprofloxacin for concern of UTI, UA was not very impressive and urine cultures pending. ? ?4/10: Patient continued to complain about lower abdominal pain, postvoid bladder scan with more than 500 cc of urine.  Lower abdominal discomfort most likely secondary to urinary retention.  Urology wants to continue with conservative management and Flomax at this time. ?Plan was to discharge him to have a close outpatient urology follow-up but patient is refusing discharge before fixing this urinary problem.  Another message sent to urology. ?Urine cultures remain pending. ? ?4/11: Patient is going to the OR with urology for cystoscopy and urethral dilatation and Foley catheter placement under anesthesia.  He did experience gross hematuria after failed multiple attempts with urology yesterday.  Continues to have lower abdominal pain. ? ?4/12: Patient had his cystoscopy and urethral dilatation with urology yesterday.  Foley catheter was placed and plan was to keep it in for 7 to 10 days with outpatient urology follow-up for further management. ?Patient developed RVR this morning, difficult to control even with his higher dose of Cardizem and increase in metoprolol from 50  to 75 mg twice daily.  He was transferred to stepdown and started on amiodarone bolus followed by infusion.  Cardiology was also consulted. ?Patient was asking for morphine for urethral pain-we will try to avoid opioids.  Can do  Toradol. ? ?Assessment and Plan: ?* Syncope and collapse ?Unclear etiology, may be vasovagal as patient did feel some dizziness before that episode.  EKG without any acute changes, troponin remain negative, echocardiogram with normal good imaging due to body habitus but did show low normal EF, indeterminate diastolic function or regional wall motion abnormalities. ?No significant abnormality noted. ?CT head and MRI was negative for any acute abnormality. ?-Continue to monitor ? ?Left-sided weakness ?Resolved ?TIA cannot be ruled out but all of the deficit has been resolved. ?Patient also has an history of conversion disorder. ?MRI brain and CTA head and neck negative. ?Neurology was consulted and he was started on on Lipitor, liver functions were normal. ?No antiplatelet as patient is on Eliquis. ?-Continue to monitor ? ?Urinary obstruction ?Patient was complaining about some dysuria and difficulty micturition.  UA was not very impressive for UTI but he was started on ciprofloxacin and urine cultures were sent.  Urine cultures with similar results of diphtheroid and Staphylococcus simulans.  Bactrim was discontinued for concern of hyperkalemia and he was restarted on ceftriaxone. ?Urology was also consulted as he has multiple failed attempts to place Foley in ED. continue to have urinary retention with postvoid residual more than 500 mL and having lower abdominal discomfort. ?S/p cystoscopy and ureteral dilatation.  Foley insertion by urology in OR.  Plan is to keep the Foley catheter for at least 7 to 10 days ?-Continue with Flomax-started by urology ? ? ?Chronic atrial fibrillation (HCC) ?Patient went into RVR earlier today with heart rate persistently elevated around  140-160.  Due to poor response to home dose of Cardizem and increased dose of metoprolol he was transferred to stepdown for amiodarone bolus followed by infusion. ?Cardiology was also consulted-will appreciate their help. ?-Continue with home dose  of Cardizem, metoprolol and Eliquis. ? ?Obesity, Class III, BMI 40-49.9 (morbid obesity) (HCC) ?Estimated body mass index is 46.8 kg/m? as calculated from the following: ?  Height as of this encounter: 6\' 5"  (1.956 m). ?  Weight as of this encounter: 179 kg.  ? ?-This will complicate overall prognosis. ? ?Schizoaffective disorder, bipolar type (HCC) ?Patient also has an history of conversion disorder with overutilization of medical care. ?-Continue home medications ? ?OSA (obstructive sleep apnea) ?Patient do desaturate while sleeping but refusing CPAP. ?-Use supplemental oxygen while sleeping ?-Keep counseling about using CPAP ? ?Diabetes mellitus type 2, uncomplicated (HCC) ?Seems well controlled with A1c of 6.2 ?-SSI ? ?Hyperkalemia ?Potassium during morning labs were 6.6, it was 5.3 on repeat.  No EKG changes except he went into RVR with A-fib/flutter. ?Patient was also on Bactrim which was discontinued. ?-Monitor potassium ? ? ?Subjective: Patient was resting comfortably when seen today.  On awakening he was complaining of urethral pain.  He was asking for morphine. ? ?Physical Exam: ?Vitals:  ? 09/08/21 1230 09/08/21 1300 09/08/21 1315 09/08/21 1330  ?BP: (!) 112/53 131/70 134/90 (!) 144/98  ?Pulse: (!) 139 (!) 141 (!) 145 (!) 134  ?Resp: 13 15 17 19   ?Temp:      ?TempSrc:      ?SpO2:  90%  92%  ?Weight:      ?Height:      ? ?General.  Morbidly obese gentleman, in no acute distress. ?Pulmonary.  Lungs clear bilaterally, normal respiratory effort. ?CV.  Regular rate and rhythm, no JVD, rub or murmur. ?Abdomen.  Soft, nontender, nondistended, BS positive. ?CNS.  Alert and oriented .  No focal neurologic deficit. ?Extremities.  No edema, no cyanosis, pulses intact and symmetrical. ?Psychiatry.  Judgment and insight appears normal. ? ?Data Reviewed: ?Prior notes, labs and images reviewed ? ?Family Communication:  ? ?Disposition: ?Status is: Inpatient ?Remains inpatient appropriate because: Severity of illness ? ?  Planned Discharge Destination:  ALF   ? ?DVT prophylaxis.  Eliquis ? ?Time spent: 50 minutes ? ?This record has been created using Conservation officer, historic buildings. Errors have been sought and corrected,but may not

## 2021-09-08 NOTE — Consult Note (Signed)
?  Amiodarone Drug - Drug Interaction Consult Note ? ?Recommendations: ?Monitor increased effects and toxicities (eg, bradycardia, sinus arrest, decreased cardiac output) with concomitant metoprolol and diltiazem ? ?Amiodarone is metabolized by the cytochrome P450 system and therefore has the potential to cause many drug interactions. Amiodarone has an average plasma half-life of 50 days (range 20 to 100 days).  ? ?There is potential for drug interactions to occur several weeks or months after stopping treatment and the onset of drug interactions may be slow after initiating amiodarone.  ? ?[x]  Statins: Increased risk of myopathy. Counsel patients to report any muscle pain or weakness immediately. ?Atorvastatin 40mg  PO QD ? ?[x]  Beta blockers: increased risk of bradycardia, AV block and myocardial depression.  ?Metoprolol 75mg  PO BID ? ?[x]   Calcium channel blockers (diltiazem and verapamil): increased risk of bradycardia, AV block and myocardial depression. ?Diltiazem CD 360mg   PO QD ? ?[x]  Drugs that prolong the QT interval:  Torsades de pointes risk may be increased with concurrent use - avoid if possible.  Monitor QTc, also keep magnesium/potassium WNL if concurrent therapy can't be avoided. ?Escitalopram 20mg  PO QD ?Paliperidone ER 9mg  PO QAM ? ? ? ?Thank you for allowing pharmacy to be a part of this patient?s care. ? ? , PharmD, MS PGPM ?Clinical Pharmacist ?09/08/2021 ?9:05 AM ? ? ? ?  ?

## 2021-09-08 NOTE — Assessment & Plan Note (Signed)
Patient went into RVR earlier today with heart rate persistently elevated around  140-160.  Due to poor response to home dose of Cardizem and increased dose of metoprolol he was transferred to stepdown for amiodarone bolus followed by infusion. ?Cardiology was also consulted-will appreciate their help. ?-Continue with home dose of Cardizem, metoprolol and Eliquis. ?

## 2021-09-08 NOTE — Assessment & Plan Note (Signed)
Seems well controlled with A1c of 6.2 ?-SSI ?

## 2021-09-08 NOTE — Progress Notes (Signed)
[name] called out of room complaining of heart palpitations and urethral pain. Assessed patient and found him to be stable. Patient stated that the palpitations he was having was due to his urethral pain rated #10. Notified NP Katy whom ordered patient to have a 1x dose of morphine. Gave morphine to patient. Checked patient's blood glucose and it was 258-notified NP Katy and got order for 7 units of insulin-gave.Patient had told me that he was being given whatever he wanted to eat by staff from another shift. I explained to patient that he is on a carb modified diet and that it is specific to his hx of diabetes. Patient argued that he could not understand why he got it previously but could not have it now. Educated patient on orders, his diet and how not eating properly affects his health. Patient seemed agitated because I would not bring him 2 ice creams, chocolate milks, a snack box and 4 graham crackers. ? ?Patient called out of room again stating that the palpitations were continuing. Contacted NP Katy and an EKG was ordered. EKG confirmed AFIB w/RVR-informed Katy. Katy ordered metoprolol 5mg  IV. I placed patient on cardiac monitoring before pushing the metoprolol slowly. Patient's heart rate appears to be stabilizing in the low 100s. ?

## 2021-09-08 NOTE — Assessment & Plan Note (Signed)
Patient was complaining about some dysuria and difficulty micturition.  UA was not very impressive for UTI but he was started on ciprofloxacin and urine cultures were sent.  Urine cultures with similar results of diphtheroid and Staphylococcus simulans.  Bactrim was discontinued for concern of hyperkalemia and he was restarted on ceftriaxone. ?Urology was also consulted as he has multiple failed attempts to place Foley in ED. continue to have urinary retention with postvoid residual more than 500 mL and having lower abdominal discomfort. ?S/p cystoscopy and ureteral dilatation.  Foley insertion by urology in OR.  Plan is to keep the Foley catheter for at least 7 to 10 days ?-Continue with Flomax-started by urology ? ?

## 2021-09-09 ENCOUNTER — Telehealth: Payer: Self-pay | Admitting: Nurse Practitioner

## 2021-09-09 ENCOUNTER — Telehealth: Payer: Self-pay | Admitting: Urology

## 2021-09-09 DIAGNOSIS — R55 Syncope and collapse: Secondary | ICD-10-CM | POA: Diagnosis not present

## 2021-09-09 DIAGNOSIS — F250 Schizoaffective disorder, bipolar type: Secondary | ICD-10-CM

## 2021-09-09 LAB — URINE CULTURE: Culture: NO GROWTH

## 2021-09-09 LAB — BASIC METABOLIC PANEL
Anion gap: 5 (ref 5–15)
BUN: 31 mg/dL — ABNORMAL HIGH (ref 6–20)
CO2: 33 mmol/L — ABNORMAL HIGH (ref 22–32)
Calcium: 8.9 mg/dL (ref 8.9–10.3)
Chloride: 98 mmol/L (ref 98–111)
Creatinine, Ser: 1.04 mg/dL (ref 0.61–1.24)
GFR, Estimated: >60 mL/min (ref >60)
Glucose, Bld: 158 mg/dL — ABNORMAL HIGH (ref 70–99)
Potassium: 5.2 mmol/L — ABNORMAL HIGH (ref 3.5–5.1)
Sodium: 136 mmol/L (ref 135–145)

## 2021-09-09 LAB — GLUCOSE, CAPILLARY
Glucose-Capillary: 146 mg/dL — ABNORMAL HIGH (ref 70–99)
Glucose-Capillary: 178 mg/dL — ABNORMAL HIGH (ref 70–99)

## 2021-09-09 MED ORDER — SODIUM ZIRCONIUM CYCLOSILICATE 10 G PO PACK: 10.0000 g | PACK | Freq: Every day | ORAL | 0 refills | Status: AC

## 2021-09-09 MED ORDER — DILTIAZEM HCL ER COATED BEADS 360 MG PO CP24: 360.0000 mg | ORAL_CAPSULE | Freq: Every day | ORAL | 1 refills | Status: DC

## 2021-09-09 MED ORDER — CIPROFLOXACIN HCL 500 MG PO TABS: 500.0000 mg | ORAL_TABLET | Freq: Two times a day (BID) | ORAL | Status: DC

## 2021-09-09 MED ORDER — IBUPROFEN 800 MG PO TABS: 800.0000 mg | ORAL_TABLET | Freq: Three times a day (TID) | ORAL | 0 refills | Status: DC | PRN

## 2021-09-09 MED ORDER — CIPROFLOXACIN HCL 500 MG PO TABS: 500.0000 mg | ORAL_TABLET | Freq: Two times a day (BID) | ORAL | 0 refills | Status: AC

## 2021-09-09 MED ORDER — DILTIAZEM HCL ER COATED BEADS 180 MG PO CP24
360.0000 mg | ORAL_CAPSULE | Freq: Every day | ORAL | Status: DC
  Administered 2021-09-09: 360 mg via ORAL
  Filled 2021-09-09: qty 2

## 2021-09-09 MED ORDER — TAMSULOSIN HCL 0.4 MG PO CAPS: 0.4000 mg | ORAL_CAPSULE | Freq: Every day | ORAL | 2 refills | Status: AC

## 2021-09-09 MED ORDER — METOPROLOL TARTRATE 50 MG PO TABS: 50.0000 mg | ORAL_TABLET | Freq: Two times a day (BID) | ORAL | Status: DC

## 2021-09-09 MED ORDER — METOPROLOL TARTRATE 50 MG PO TABS: 50.0000 mg | ORAL_TABLET | Freq: Two times a day (BID) | ORAL | 2 refills | Status: DC

## 2021-09-09 MED ORDER — PHENAZOPYRIDINE HCL 100 MG PO TABS: 100.0000 mg | ORAL_TABLET | Freq: Three times a day (TID) | ORAL | 0 refills | Status: DC

## 2021-09-09 NOTE — Progress Notes (Signed)
?   09/09/21 0700  ?Clinical Encounter Type  ?Visited With Patient  ?Visit Type Follow-up  ?Referral From Chaplain  ?Consult/Referral To Chaplain  ? ?Chaplain followed up per on call Chaplain. Chaplain provided compassionate presence and prayer. ?

## 2021-09-09 NOTE — Progress Notes (Signed)
? ?Cardiology Progress Note  ? ?Patient Name: Daniel [name] ?Date of Encounter: 09/09/2021 ? ?Primary Cardiologist: Nelva Bush, MD ? ?Subjective  ? ?Feels well this AM.  Urethral pain improved.  No c/p, dyspnea, palps.  Became bradycardic on IV dilt and this was d/c'd.  HRs 80's this AM. ? ?Inpatient Medications  ?  ?Scheduled Meds: ? allopurinol  300 mg Oral Daily  ? apixaban  5 mg Oral BID  ? atorvastatin  40 mg Oral QHS  ? chlorhexidine gluconate (MEDLINE KIT)  15 mL Mouth Rinse BID  ? Chlorhexidine Gluconate Cloth  6 each Topical Daily  ? diltiazem  360 mg Oral Daily  ? divalproex  1,000 mg Oral q morning  ? divalproex  1,500 mg Oral QHS  ? escitalopram  20 mg Oral Daily  ? insulin aspart  0-20 Units Subcutaneous TID WC  ? loratadine  10 mg Oral Daily  ? melatonin  2.5 mg Oral QHS  ? metoprolol tartrate  50 mg Oral BID  ? paliperidone  9 mg Oral q morning  ? pantoprazole  40 mg Oral Daily  ? phenazopyridine  100 mg Oral TID WC  ? sodium zirconium cyclosilicate  10 g Oral Daily  ? tamsulosin  0.4 mg Oral Daily  ? ?Continuous Infusions: ? sodium chloride Stopped (09/05/21 1156)  ? cefTRIAXone (ROCEPHIN)  IV Stopped (09/08/21 1540)  ? ?PRN Meds: ?sodium chloride, acetaminophen **OR** acetaminophen, albuterol, baclofen, bisacodyl, fluticasone, ketorolac  ? ?Vital Signs  ?  ?Vitals:  ? 09/08/21 2114 09/08/21 2321 09/09/21 0346 09/09/21 0751  ?BP: 120/74 113/65 125/73 125/77  ?Pulse: (!) 107 85 96 86  ?Resp: _0 ?Temp: (!) 97.5 ?F (36.4 ?C)  98.4 ?F (36.9 ?C) 98.1 ?F (36.7 ?C)  ?TempSrc:   Oral Oral  ?SpO2: 95% 93% 94% 93%  ?Weight:      ?Height:      ? ? ?Intake/Output Summary (Last 24 hours) at 09/09/2021 0852 ?Last data filed at 09/09/2021 0751 ?Gross per 24 hour  ?Intake 228.66 ml  ?Output 1075 ml  ?Net -846.34 ml  ? ?Filed Weights  ? 09/04/21 1153 09/07/21 1458 09/08/21 0949  ?Weight: (!) 179 kg (!) 176.4 kg (!) 180.4 kg  ? ? ?Physical Exam  ? ?GEN: obese, in no acute distress.  ?HEENT: Grossly  normal.  ?Neck: Supple, obese, difficult to gauge JVP.  No carotid bruits or masses. ?Cardiac: IR, IR, no murmurs, rubs, or gallops. No clubbing, cyanosis, trace bilat LE edema.  Radials 2+, DP/PT 2+ and equal bilaterally.  ?Respiratory:  Respirations regular and unlabored, clear to auscultation bilaterally. ?GI: Soft, nontender, nondistended, BS + x 4. ?MS: no deformity or atrophy. ?Skin: warm and dry, no rash. ?Neuro:  Strength and sensation are intact. ?Psych: AAOx3.  Normal affect. ? ?Labs  ?  ?Chemistry ?Recent Labs  ?Lab 09/05/21 ?0630 09/06/21 ?8828 09/08/21 ?0435 09/08/21 ?0034 09/09/21 ?9179  ?NA 133* 138 134*  --  136  ?K 4.7 4.1 6.6* 5.3* 5.2*  ?CL 95* 96* 95*  --  98  ?CO2 28 32 29  --  33*  ?GLUCOSE 136* 110* 180*  --  158*  ?BUN _1 --  31*  ?CREATININE 1.10 0.96 1.15  --  1.04  ?CALCIUM 9.3 8.9 9.4  --  8.9  ?PROT 7.1  --   --   --   --   ?ALBUMIN 3.5  --   --   --   --   ?  AST 32  --   --   --   --   ?ALT 18  --   --   --   --   ?ALKPHOS 53  --   --   --   --   ?BILITOT 0.7  --   --   --   --   ?GFRNONAA >60 >60 >60  --  >60  ?ANIONGAP _0 --  5  ?  ? ?Hematology ?Recent Labs  ?Lab 09/04/21 ?1151 09/05/21 ?0216 09/08/21 ?5053  ?WBC 6.6 7.1 7.2  ?RBC 4.94 4.70 4.66  ?HGB 13.9 13.4 13.3  ?HCT 42.8 40.9 40.8  ?MCV 86.6 87.0 87.6  ?MCH 28.1 28.5 28.5  ?MCHC 32.5 32.8 32.6  ?RDW 13.4 13.4 13.3  ?PLT 161 145* 157  ? ? ?Cardiac Enzymes  ?Recent Labs  ?Lab 08/13/21 ?2311 08/14/21 ?0125 08/27/21 ?2317 09/04/21 ?1151 09/04/21 ?1422  ?TROPONINIHS _1 ?   ? ?BNP ?   ?Component Value Date/Time  ? BNP 235.4 (H) 08/13/2021 2325  ? ? ?Lipids  ?Lab Results  ?Component Value Date  ? CHOL 133 08/29/2021  ? HDL 32 (L) 08/29/2021  ? [place] 50 08/29/2021  ? TRIG 255 (H) 08/29/2021  ? CHOLHDL 4.2 08/29/2021  ? ? ?HbA1c  ?Lab Results  ?Component Value Date  ? HGBA1C 6.2 (H) 09/04/2021  ? ? ?Radiology  ?  ?No results found. ? ?Telemetry  ?  ?Afib, PVCs, 80's - Personally Reviewed ? ?Cardiac Studies   ? ?07/2021 Echo ? ?EF 50%, mild LVH, nl RV size/function. No significant valvular disease. ?_____________  ? ?Patient Profile  ?   ? 31 y.o. male with a history of HTN, HL, sleep disordered breathing, obesity, ? TIA, low-nl LV fxn, schizoaffective d/o, sz d/o, conversion d/o, and persistent afib/flutter, who was admitted 4/8 due to abd pain, urinary retention, and urethral stricture s/p dilation on 4/11, w/ chest pain and rapid atrial fib/flutter post-op. ? ?Assessment & Plan  ?  ?1.  Persistent Afib/flutter:  Per pt, h/o atrial arrhythmias dating back to age 81, prev following in [place], and on ccb and eliquis @ home.  Seen by our team during admission in March, at which time he was rate-controlled on dilt and metoprolol.  EF 50% during that admission.  Readmitted 4/8 following syncopal spell w/ c/o abd discomfort, urinary retention, and finding of urethral stricture.  Rapid aflutter post urethral dilation on 4/11, likely driven by pain.  IV dilt added 4/12 w/ improved rates and subsequent bradycardia resulting in d/c.  Rates 80's this AM.  Will add back home dose of PO dilt and down-titrate ? blocker to prior home dose of 50 BID.  Eliquis resumed - urine dark but H/H stable.  Cont plan for rate control.  Seen by EP this admission  He is not a candidate for ablative rx.  Needs sleep study, treatment of presumed OSA, and wt loss.  If he goes home today, we will arrange for outpt f/u within the next few wks. ? ?2.  Urethral Stricture:  s/p dil on 4/11.  Urology following. ? ?3.  Essential HTN:  stable. ? ?4.  Hyperkalemia:  K still mildly elevated- 5.2 this AM. ? ?5.  ? Syncope:  Initially [place] - presumed to be vasovagal.  No recurrent presyncope/syncope or significant symptomatic tachy/bradyarrhythmias.  EF 50% by prior echo. ? ?6.  Sleep disordered breathing:  Has never had  sleep study.  Will need to be set up as outpt. ? ?7.  Sz d/o:  Per  IM. ? ?Signed, ?Murray Hodgkins, NP  ?09/09/2021, 8:52 AM   ? ?For questions or updates, please contact   ?Please consult [email or web address] for contact info under Cardiology/STEMI.  ?

## 2021-09-09 NOTE — Discharge Summary (Signed)
?Physician Discharge Summary ?  ?Patient: Daniel [name] MRN: [id number] DOB: 10-Dec-1990  ?Admit date:     09/04/2021  ?Discharge date: 09/09/21  ?Discharge Physician: Arnetha Courser  ? ?PCP: Anselm Jungling, NP  ? ?Recommendations at discharge:  ?Please check BMP in 2 days.  Patient is being discharged on 2 more doses of Lokelma due to borderline high potassium at 5.2 on the day of discharge. ?Patient is also being discharged on 7 days of ciprofloxacin for UTI as advised by urology, please ensure that he complete the course ?Patient is being discharged with Foley catheter and need to have a appointment with urology in 7 to 10 days for catheter removal and further recommendations. ?Patient need to have a sleep study done as soon as possible and start using CPAP at [facility] accordingly. ?Follow-up with cardiology closely and they might consider cardioversion in a month time after addressing the sleep issues. ?Patient need encouragement and help to lose weight. ?Follow-up with primary care provider within a week. ?Follow-up with outpatient psychiatry in 1 to 2 weeks. ? ?Discharge Diagnoses: ?Principal Problem: ?  Syncope and collapse ?Active Problems: ?  Left-sided weakness ?  Urinary obstruction ?  Chronic atrial fibrillation (HCC) ?  Obesity, Class III, BMI 40-49.9 (morbid obesity) (HCC) ?  Schizoaffective disorder, bipolar type (HCC) ?  OSA (obstructive sleep apnea) ?  Diabetes mellitus type 2, uncomplicated (HCC) ?  Hyperkalemia ?  Inability to urinate ?  Atrial flutter (HCC) ? ? ?Hospital Course: ?Taken from H&P and prior notes. ? ?[name] is a 31 y.o. male with medical history significant for  schizoaffective d/o , conversion disorder, HLD, a fib on eliquis, seizure disorder, GERD who presents via EMS from Group home on account of an episode of syncope. Patient denied hitting heis head. Incidentally, patient was recently seen and evaluated on 08/28/2021 on account of similar symptoms and was worked up with CT  and MRI.  He was again seen on 08/31/2021 in the ED for urinary tract infection and found to have nonobstructing right kidney stone.  He was discharged home on cefpodoxime.  He presented today on account of dysuria and an episode of syncope. Symptoms were preceded by lightheadedness.  Denied any associated nausea or vomiting.   ?In the ED, patient complained of lower abdominal discomfort.  Bladder scan done in the ED did reveal greater than 700 cc of urine.  Attempts to catheterize patient in the ED by nurses were unsuccessful.  Urology was hence consulted to help with Foley catheter placement.  Patient's caretaker [name] however declined for procedure hence conservative management with watchful waiting was advised.  Whilst in the ED, patient was able to void 300 cc of urine with some difficulty.  Postvoid bladder scan shows 700 cc of urine.  He feels much better. ? ?Overnight patient developed acute left-sided weakness and having some altered mental status.  Code stroke was called and neurology was consulted.  MRI brain was without any acute abnormality.  CTA head and neck was negative for any large vessel occlusion. ?Symptoms resolved quickly.  Patient has an history of seizure disorder but no witnessed seizure. ?Valproic level were checked and they were 100 which were therapeutic. ?Neurology was advising to add atorvastatin 40 mg daily. ?No antiplatelet as patient is on Eliquis for history of atrial fibrillation. ?He was started on atorvastatin after checking CMP and normal liver function. ?Patient also had renal ultrasound which shows a 9 mm nonobstructive renal stone. ? ?4/9: All neurologic deficit has  been resolved.  Patient was having difficulty voiding and was complaining of lower abdominal pain.  We checked postvoid bladder scan which shows more than 400 residual.  Urology was again consulted. ?Patient was also started on ciprofloxacin for concern of UTI, UA was not very impressive and urine cultures  pending. ? ?4/10: Patient continued to complain about lower abdominal pain, postvoid bladder scan with more than 500 cc of urine.  Lower abdominal discomfort most likely secondary to urinary retention.  Urology wants to continue with conservative management and Flomax at this time. ?Plan was to discharge him to have a close outpatient urology follow-up but patient is refusing discharge before fixing this urinary problem.  Another message sent to urology. ?Urine cultures remain pending. ? ?4/11: Patient is going to the OR with urology for cystoscopy and urethral dilatation and Foley catheter placement under anesthesia.  He did experience gross hematuria after failed multiple attempts with urology yesterday.  Continues to have lower abdominal pain. ? ?4/12: Patient had his cystoscopy and urethral dilatation with urology yesterday.  Foley catheter was placed and plan was to keep it in for 7 to 10 days with outpatient urology follow-up for further management. ?Patient developed RVR this morning, difficult to control even with his higher dose of Cardizem and increase in metoprolol from 50 to 75 mg twice daily.  He was transferred to stepdown and started on amiodarone bolus followed by infusion.  Cardiology was also consulted. ? ?4/13: Cardiology started him on digoxin and increase the dose of metoprolol resulted in controlled ventricular rate.  Patient has been evaluated by EP in March 2023 and was not a candidate for any other antiarrhythmic drugs.  It was thought to be due to obstructive sleep apnea with obesity and hypoventilation syndrome which needs to be addressed before proceeding for any cardioversion.  He did need to have a sleep study completed by PCP as soon as possible and use CPAP or BiPAP as advised.  Per cardiology they can attempt cardioversion after addressing the sleep issues in about a month.  Patient should remain on anticoagulation during that time. ?Patient need to have a close follow-up with  cardiology for close monitoring and further recommendations. ? ?Patient is morbidly obese.  He was counseled extensively but will need help from primary care doctor and a possible intervention to lose weight.  Primary care doctor can refer him to a obesity/weight reduction programs for further help. ? ?Had a long discussion with mother and apparently he is having urinary issues since childhood.  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  No follow-ups and that issue was never addressed.  Hopefully this current urethral dilatation helps with his chronic urinary retention and decrease the frequency of UTIs.  Patient is being discharged on Cipro for 7 days per susceptibility results. ?Patient is being discharged with Foley catheter and will need a follow-up with urology in 7 to 10 days for removal. ? ?Patient also developed hyperkalemia so Bactrim was discontinued, received Lokelma and potassium was 5.2 which was borderline high, [facility] and need to have a potassium checked in 2 days and once below 5, Lokelma can be discontinued. ? ?Patient was also found to have A1c of 6.2 which makes him prediabetic.  Patient had a very high carb eating habits which need to be addressed and will be managed by primary care provider as an outpatient. ? ?Patient also need to have a close  follow-up with his psychiatrist to address underlying psych issues for a healthier lifestyle. ? ?Patient will continue on current management and follow-up with his providers. ? ? ?Assessment and Plan: ?* Syncope and collapse ?Unclear etiology, may be vasovagal as patient did feel some dizziness before that episode.  EKG without any acute changes, troponin remain negative, echocardiogram with normal good imaging due to body habitus but did show low normal EF, indeterminate diastolic function or regional wall motion abnormalities. ?No significant abnormality noted. ?CT head and MRI was negative  for any acute abnormality. ?-Continue to monitor ? ?Left-sided weakness ?Resolved ?TIA cannot be ruled out but all of the deficit has been resolved. ?Patient also has an history of conversion disorder. ?MRI bra

## 2021-09-09 NOTE — [place] FL2 (Signed)
? MEDICAID FL2 LEVEL OF CARE SCREENING TOOL  ?  ? ?IDENTIFICATION  ?Patient Name: ?Daniel [name] Birthdate: [birth year] Sex: male Admission Date (Current Location): ?09/04/2021  ?[place] Number: ? [place] ?  Facility and Address:  ?[facility], [address], [place], [place] [address] ?     Provider Number: ?[id number]  ?Attending Physician Name and Address:  ?Arnetha Courser, MD ? Relative Name and Phone Number:  ?[name] [phone] ?   ?Current Level of Care: ?Hospital Recommended Level of Care: ?Other (Comment) (group home) Prior Approval Number: ?  ? ?Date Approved/Denied: ?  PASRR Number: ?  ? ?Discharge Plan: ?Other (Comment) (group home) ?  ? ?Current Diagnoses: ?Patient Active Problem List  ? Diagnosis Date Noted  ? Hyperkalemia 09/08/2021  ? Atrial flutter (HCC)   ? Urinary obstruction 09/05/2021  ? Inability to urinate   ? Conversion disorder   ? Syncope and collapse 08/28/2021  ? History of gout 08/28/2021  ? Schizoaffective disorder, bipolar type (HCC) 08/14/2021  ? Chronic hypoxemic respiratory failure (HCC) 08/14/2021  ? OSA (obstructive sleep apnea)   ? Diabetes mellitus type 2, uncomplicated (HCC)   ? Left-sided weakness 08/02/2021  ? Obesity, Class III, BMI 40-49.9 (morbid obesity) (HCC) 08/02/2021  ? Chronic atrial fibrillation (HCC) 08/02/2021  ? ? ?Orientation RESPIRATION BLADDER Height & Weight   ?  ?Self, Time, Situation, Place ? Normal Continent (foley cath) Weight: (!) 397 lb 11.4 oz (180.4 kg) ?Height:  6\' 5"  (195.6 cm)  ?BEHAVIORAL SYMPTOMS/MOOD NEUROLOGICAL BOWEL NUTRITION STATUS  ?    Continent Diet (heart)  ?AMBULATORY STATUS COMMUNICATION OF NEEDS Skin   ?Independent Verbally  (foley cath) ?  ?  ?  ?    ?     ?     ? ? ?Personal Care Assistance Level of Assistance  ?Bathing, Feeding, Dressing, Total care Bathing Assistance: Limited assistance ?Feeding assistance: Independent ?Dressing Assistance: Limited assistance ?Total Care  Assistance: Independent  ? ?Functional Limitations Info  ?Sight, Hearing, Speech Sight Info: Adequate ?Hearing Info: Adequate ?Speech Info: Adequate  ? ? ?SPECIAL CARE FACTORS FREQUENCY  ?    ?  ?  ?  ?  ?  ?  ?   ? ? ?Contractures Contractures Info: Not present  ? ? ?Additional Factors Info  ?  Code Status Info: Full ?Allergies Info: haldol (haloperidol), ability (aripiprazole), demerol (meperidine) ?  ?  ?  ?   ? ?Discharge Medications: ?albuterol 108 (90 Base) MCG/ACT inhaler ?Commonly known as: VENTOLIN HFA ?Inhale 2 puffs into the lungs every 6 (six) hours as needed for wheezing. ?   ?allopurinol 300 MG tablet ?Commonly known as: ZYLOPRIM ?Take 1 tablet (300 mg total) by mouth daily. ?   ?atorvastatin 80 MG tablet ?Commonly known as: LIPITOR ?Take 80 mg by mouth daily. ?   ?baclofen 10 MG tablet ?Commonly known as: LIORESAL ?Take 1 tablet (10 mg total) by mouth 2 (two) times daily as needed for muscle spasms. Home med. ?   ?ciprofloxacin 500 MG tablet ?Commonly known as: CIPRO ?Take 1 tablet (500 mg total) by mouth 2 (two) times daily for 7 days. ?Start taking on: September 10, 2021 ?   ?diltiazem 360 MG 24 hr capsule ?Commonly known as: CARDIZEM CD ?Take 1 capsule (360 mg total) by mouth daily. ?Start taking on: September 10, 2021 ?   ?divalproex 500 MG DR tablet ?Commonly known as: DEPAKOTE ?Take 1,500 mg by mouth 2 (two) times daily. 1000 mg every  morning and 1500 mg at bedtime ?   ?Eliquis 5 MG Tabs tablet ?Generic drug: apixaban ?Take 5 mg by mouth 2 (two) times daily. ?   ?escitalopram 10 MG tablet ?Commonly known as: LEXAPRO ?Take 20 mg by mouth daily. ?   ?fluticasone 50 MCG/ACT nasal spray ?Commonly known as: FLONASE ?Place 2 sprays into both nostrils 2 (two) times daily. ?   ?ibuprofen 800 MG tablet ?Commonly known as: ADVIL ?Take 1 tablet (800 mg total) by mouth every 8 (eight) hours as needed. ?   ?Invega 9 MG 24 hr tablet ?Generic drug: paliperidone ?Take 9 mg by mouth every morning. ?   ?loratadine 10 MG  tablet ?Commonly known as: CLARITIN ?Take 10 mg by mouth daily. ?   ?melatonin 3 MG Tabs tablet ?Take 3 mg by mouth at bedtime. ?   ?metoprolol tartrate 50 MG tablet ?Commonly known as: LOPRESSOR ?Take 1 tablet (50 mg total) by mouth 2 (two) times daily. ?   ?omeprazole 20 MG capsule ?Commonly known as: PRILOSEC ?Take 20 mg by mouth daily. ?   ?phenazopyridine 100 MG tablet ?Commonly known as: PYRIDIUM ?Take 1 tablet (100 mg total) by mouth 3 (three) times daily with meals. ?What changed:  ?medication strength ?how much to take ?when to take this ?reasons to take this ?   ?sodium zirconium cyclosilicate 10 g Pack packet ?Commonly known as: LOKELMA ?Take 10 g by mouth daily for 2 days. ?Start taking on: September 10, 2021 ?   ?tamsulosin 0.4 MG Caps capsule ?Commonly known as: FLOMAX ?Take 1 capsule (0.4 mg total) by mouth daily. ?Start taking on: September 10, 2021 ?   ?  ?   ? ? ?Relevant Imaging Results: ? ?Relevant Lab Results: ? ? ?Additional Information ?SSN:[id number] ? ?Gildardo Griffes, LCSW ? ? ? ? ?

## 2021-09-09 NOTE — Discharge Instructions (Addendum)
It was pleasure taking care of you. ?You are being given antibiotics for 1 week, please take it as directed. ?You are also being discharged with Foley catheter, please keep it clean. ?Keep yourself well-hydrated and follow-up with urology in 7 to 10 days for further recommendations. ? ?Your cardiologist started you on a new medicine called digoxin, please take it as directed along with your Cardizem and metoprolol.  Also continue with Eliquis and have a close follow-up with your cardiologist for further recommendations. ? ?It is very important that you address your sleep issues as most likely your cardiac abnormal rhythm is being driven by sleep apnea and hypoventilation syndrome which is very common when you are that obese. ?Please have a sleep study done which will be ordered by your primary care doctor and use CPAP or BiPAP according to the recommendations.  After addressing that issue your cardiologist might do a cardioversion to bring you back to the normal sinus rhythm within next month. ? ?It is also very important that you lose weight as you are also becoming prediabetic and staying that obese will decrease your life expectancy and cause a lot more health issues.  Please ask your primary care doctor to help. ? ?Continue current medications and follow-up with your doctors. ?

## 2021-09-09 NOTE — Telephone Encounter (Signed)
Hospital f/u with Foley catheter and need to have a appointment with urology in 7 to 10 days for catheter removal and further recommendations. ? ?LVM to call our office for appt. ?

## 2021-09-09 NOTE — Telephone Encounter (Signed)
Scheduled with Daniel [name] on 2A  ? ?5-11 at [place] ?

## 2021-09-09 NOTE — Progress Notes (Signed)
Mobility Specialist - Progress Note ? ? ? 09/09/21 1300  ?Mobility  ?Activity Ambulated independently in hallway;Stood at bedside  ?Level of Assistance Independent  ?Assistive Device None  ?Distance Ambulated (ft) 200 ft  ?Activity Response Tolerated well  ?$Mobility charge 1 Mobility  ? ? ? ?During mobility: 105 HR, 91% SpO2 ? ?Pt ambulated 249ft indep in hallway voicing no complaints using RA. Pt left in room with needs in reach waiting for upcoming discharge. ? ?Daniel [name] ?Mobility Specialist ?09/09/21, 2:02 PM ? ? ? ? ?

## 2021-09-09 NOTE — Telephone Encounter (Signed)
-----   Message from Creig Hines, NP sent at 09/09/2021  9:00 AM EDT ----- ?Regarding: f/u ?Good morning, ? ?Would you pls arrange for this pt to f/u in the next 2-4 wks w/ End or me/cadence? ? ?Thanks, ? ?Thayer Ohm ? ?

## 2021-09-12 ENCOUNTER — Emergency Department: Payer: Medicaid Other

## 2021-09-12 ENCOUNTER — Other Ambulatory Visit: Payer: Self-pay

## 2021-09-12 ENCOUNTER — Emergency Department
Admission: EM | Admit: 2021-09-12 | Discharge: 2021-09-13 | Disposition: A | Discharge status: Home / Self Care | Payer: Medicaid Other | Attending: Emergency Medicine | Admitting: Emergency Medicine

## 2021-09-12 ENCOUNTER — Encounter: Payer: Self-pay | Admitting: Emergency Medicine

## 2021-09-12 DIAGNOSIS — E119 Type 2 diabetes mellitus without complications: Secondary | ICD-10-CM | POA: Insufficient documentation

## 2021-09-12 DIAGNOSIS — R0789 Other chest pain: Secondary | ICD-10-CM | POA: Diagnosis not present

## 2021-09-12 DIAGNOSIS — R55 Syncope and collapse: Secondary | ICD-10-CM | POA: Diagnosis present

## 2021-09-12 DIAGNOSIS — R301 Vesical tenesmus: Secondary | ICD-10-CM | POA: Insufficient documentation

## 2021-09-12 DIAGNOSIS — I4891 Unspecified atrial fibrillation: Secondary | ICD-10-CM | POA: Insufficient documentation

## 2021-09-12 DIAGNOSIS — I10 Essential (primary) hypertension: Secondary | ICD-10-CM | POA: Insufficient documentation

## 2021-09-12 DIAGNOSIS — R1030 Lower abdominal pain, unspecified: Secondary | ICD-10-CM | POA: Insufficient documentation

## 2021-09-12 DIAGNOSIS — Z7901 Long term (current) use of anticoagulants: Secondary | ICD-10-CM | POA: Diagnosis not present

## 2021-09-12 LAB — COMPREHENSIVE METABOLIC PANEL
ALT: 12 U/L (ref 0–44)
AST: 15 U/L (ref 15–41)
Albumin: 3.4 g/dL — ABNORMAL LOW (ref 3.5–5.0)
Alkaline Phosphatase: 44 U/L (ref 38–126)
Anion gap: 12 (ref 5–15)
BUN: 19 mg/dL (ref 6–20)
CO2: 35 mmol/L — ABNORMAL HIGH (ref 22–32)
Calcium: 9 mg/dL (ref 8.9–10.3)
Chloride: 91 mmol/L — ABNORMAL LOW (ref 98–111)
Creatinine, Ser: 1.08 mg/dL (ref 0.61–1.24)
GFR, Estimated: >60 mL/min (ref >60)
Glucose, Bld: 87 mg/dL (ref 70–99)
Potassium: 4.6 mmol/L (ref 3.5–5.1)
Sodium: 138 mmol/L (ref 135–145)
Total Bilirubin: 0.6 mg/dL (ref 0.3–1.2)
Total Protein: 6.2 g/dL — ABNORMAL LOW (ref 6.5–8.1)

## 2021-09-12 LAB — URINALYSIS, ROUTINE W REFLEX MICROSCOPIC
Bilirubin Urine: NEGATIVE
Glucose, UA: NEGATIVE mg/dL
Ketones, ur: NEGATIVE mg/dL
Leukocytes,Ua: NEGATIVE
Nitrite: NEGATIVE
Protein, ur: NEGATIVE mg/dL
RBC / HPF: >50 RBC/hpf — ABNORMAL HIGH (ref 0–5)
Specific Gravity, Urine: 1.01 (ref 1.005–1.030)
pH: 7 (ref 5.0–8.0)

## 2021-09-12 LAB — CBC
HCT: 39.6 % (ref 39.0–52.0)
Hemoglobin: 12.7 g/dL — ABNORMAL LOW (ref 13.0–17.0)
MCH: 28 pg (ref 26.0–34.0)
MCHC: 32.1 g/dL (ref 30.0–36.0)
MCV: 87.4 fL (ref 80.0–100.0)
Platelets: 205 10*3/uL (ref 150–400)
RBC: 4.53 MIL/uL (ref 4.22–5.81)
RDW: 13.7 % (ref 11.5–15.5)
WBC: 8.9 10*3/uL (ref 4.0–10.5)
nRBC: 0.2 % (ref 0.0–0.2)

## 2021-09-12 LAB — TROPONIN I (HIGH SENSITIVITY)
Troponin I (High Sensitivity): 4 ng/L (ref <18)
Troponin I (High Sensitivity): 4 ng/L (ref <18)

## 2021-09-12 LAB — DIGOXIN LEVEL: Digoxin Level: 0.3 ng/mL — ABNORMAL LOW (ref 0.8–2.0)

## 2021-09-12 LAB — LIPASE, BLOOD: Lipase: 34 U/L (ref 11–51)

## 2021-09-12 MED ORDER — OXYBUTYNIN CHLORIDE 5 MG PO TABS: 5.0000 mg | ORAL_TABLET | Freq: Two times a day (BID) | ORAL | 0 refills | Status: DC

## 2021-09-12 MED ORDER — MORPHINE SULFATE (PF) 4 MG/ML IV SOLN: 4.0000 mg | Freq: Once | INTRAVENOUS | Status: DC

## 2021-09-12 MED ORDER — OXYBUTYNIN CHLORIDE 5 MG PO TABS
5.0000 mg | ORAL_TABLET | ORAL | Status: AC
  Administered 2021-09-12: 5 mg via ORAL
  Filled 2021-09-12: qty 1

## 2021-09-12 MED ORDER — ONDANSETRON HCL 4 MG/2ML IJ SOLN: 4.0000 mg | INTRAMUSCULAR | Status: DC

## 2021-09-12 NOTE — ED Triage Notes (Addendum)
FIRST NURSE NOTE:  ?[facility] and kidney pain. Pt has a cath in place that was placed a couple of days ago, pt also complaining of pain at the cath site. Someone at [facility] called EMS for him because he looked pale. Pt is from a [facility]. Pt is A&Ox4 and NAD. ? ?123 CBG  ?126/85  ?70s on a fib on the monitor ?97% on RA ? ?

## 2021-09-12 NOTE — ED Notes (Signed)
Discharge ppw provided. Pt rx information provided. MD Quale verbally spoke with legal guardian. Group home called for pt pickup. Pt provided leg bag and sleeping urine drainage bag. Pt IV removed. ?

## 2021-09-12 NOTE — ED Provider Notes (Signed)
? ?[facility] ?Provider Note ? ? ? Event Date/Time  ? First MD Initiated Contact with Patient 09/12/21 1317   ?  (approximate) ? ? ?History  ? ?Pain in the lower pelvis and near syncope ? ? ?HPI ? ?Daniel [name] is a 31 y.o. male for whom I have reviewed recent discharge summary from April 13 with a past medical history of 31 y.o. male with medical history significant for  schizoaffective d/o , conversion disorder, HLD, a fib on eliquis, seizure disorder, GERD .  Also recent urinary catheterization, urinary obstruction, urinary tract infection ?  ?Patient was at church about 1 hour before arrival.  He was sitting at church, had been feeling well and started noticing pain across his lower pelvis.  The pain is located in the middle of the low pelvis.  After the pain started he started to feel kind of faint or lightheaded.  He felt like he was feeling very pale. ? ?Reports right now he is having moderate pain across his lower pelvis.  He has been.  ? ?Patient's chief complaint listed as chest pain, however patient tells me he experienced no chest pain or trouble breathing at all.  Rather pain in his pelvis followed by feeling of faintness and lightheadedness.  Denies he had any chest pain ? ?He has been on antibiotic for treatment of urinary tract infection.  Currently resides in a group home.  Reports he has not yet started the medication digoxin ? ?No fevers or chills.  Was feeling quite well until he went to church and the pain started in his lower pelvic area. ? ?Physical Exam  ? ?Triage Vital Signs: ?ED Triage Vitals  ?Enc Vitals Group  ?   BP   ?   Pulse   ?   Resp   ?   Temp   ?   Temp src   ?   SpO2   ?   Weight   ?   Height   ?   Head Circumference   ?   Peak Flow   ?   Pain Score   ?   Pain Loc   ?   Pain Edu?   ?   Excl. in [place]?   ? ? ?Most recent vital signs: ?Vitals:  ? 09/12/21 1545 09/12/21 1615  ?BP:  122/84  ?Pulse: 88 79  ?Resp: 19 15  ?Temp:    ?SpO2: 94% 96%   ? ? ? ?General: Awake, no distress.  Pleasant.  Appears in some pain reports pain across his lower pelvic region. ?CV:  Good peripheral perfusion.  Normal heart tones, slight irregularity rhythm. ?Resp:  Normal effort.  Clear lungs bilaterally. ?Abd:  No distention.  Abdomen soft nontender nondistended but he reports a somewhat deep-seated pain in the suprapubic region. ?Other:  Urinary Foley catheter, slight amber urine color, no obvious purulence or foul odor.  The penis is normal in shape and size along with the scrotum and perineum.  Foley catheter protruding and appears to be appropriately positioned by external exam ? ? ?ED Results / Procedures / Treatments  ? ?Labs ?(all labs ordered are listed, but only abnormal results are displayed) ?Labs Reviewed  ?CBC - Abnormal; Notable for the following components:  ?    Result Value  ? Hemoglobin 12.7 (*)   ? All other components within normal limits  ?COMPREHENSIVE METABOLIC PANEL - Abnormal; Notable for the following components:  ? Chloride 91 (*)   ? CO2 35 (*)   ?  Total Protein 6.2 (*)   ? Albumin 3.4 (*)   ? All other components within normal limits  ?DIGOXIN LEVEL - Abnormal; Notable for the following components:  ? Digoxin Level 0.3 (*)   ? All other components within normal limits  ?URINALYSIS, ROUTINE W REFLEX MICROSCOPIC - Abnormal; Notable for the following components:  ? Color, Urine YELLOW (*)   ? APPearance CLEAR (*)   ? Hgb urine dipstick LARGE (*)   ? RBC / HPF >50 (*)   ? Bacteria, UA RARE (*)   ? All other components within normal limits  ?URINE CULTURE  ?LIPASE, BLOOD  ?TROPONIN I (HIGH SENSITIVITY)  ?TROPONIN I (HIGH SENSITIVITY)  ? ? ? ?EKG ? ?Reviewed and interpreted by me at 1330 ?Heart rate 85 ?QRS 80 ?QTc less than 500. ?Atrial fibrillation, rate controlled.  No evidence of ischemia ? ? ?RADIOLOGY ? ?CT Renal Stone Study ? ?Result Date: 09/12/2021 ?CLINICAL DATA:  Acute flank and lower abdominal pain. EXAM: CT ABDOMEN AND PELVIS WITHOUT  CONTRAST TECHNIQUE: Multidetector CT imaging of the abdomen and pelvis was performed following the standard protocol without IV contrast. RADIATION DOSE REDUCTION: This exam was performed according to the departmental dose-optimization program which includes automated exposure control, adjustment of the mA and/or kV according to patient size and/or use of iterative reconstruction technique. COMPARISON:  None. FINDINGS: Lower chest: No acute findings. Hepatobiliary: No mass visualized on this unenhanced exam. Gallbladder is unremarkable. No evidence of biliary ductal dilatation. Pancreas: No mass or inflammatory process visualized on this unenhanced exam. Spleen:  Within normal limits in size. Adrenals/Urinary tract: No evidence of urolithiasis or hydronephrosis. Foley catheter is seen within the urinary bladder which is otherwise unremarkable in appearance. Stomach/Bowel: No evidence of obstruction, inflammatory process, or abnormal fluid collections. Mild Diverticulosis is seen mainly involving the sigmoid colon, however there is no evidence of diverticulitis. Vascular/Lymphatic: No pathologically enlarged lymph nodes identified. No evidence of abdominal aortic aneurysm. Reproductive:  No mass or other significant abnormality. Other:  None. Musculoskeletal:  No suspicious bone lesions identified. IMPRESSION: No evidence of urolithiasis, hydronephrosis, or other acute findings. Foley catheter in appropriate position. Mild sigmoid diverticulosis. No radiographic evidence of diverticulitis. Electronically Signed   By: Marlaine Hind M.D.   On: 09/12/2021 14:01   ? ? ? ? ?PROCEDURES: ? ?Critical Care performed: No ? ?Procedures ? ? ?MEDICATIONS ORDERED IN ED: ?Medications  ?oxybutynin (DITROPAN) tablet 5 mg (5 mg Oral Given 09/12/21 1640)  ? ? ? ?IMPRESSION / MDM / ASSESSMENT AND PLAN / ED COURSE  ?I reviewed the triage vital signs and the nursing notes. ?             ?               ? ?Differential diagnosis includes,  but is not limited to, possible bladder spasm, urinary tract infection, nephrolithiasis, or other considerations for acute intra-abdominal pain.  He also is symptomatology starting with pain in his lower pelvis with Foley catheter in place would also benefit from imaging to check position of Foley bulb, and also evaluate for possible nephrolithiasis or other acute renal abnormalities.  Low pretest probability for acute intra-abdominal infection or perforation.  He is very reassuring exam. ? ?Additionally, he denies any cardiopulmonary or neurologic symptoms other than feeling faint following development of pain in the lower pelvis.  Suspect likely orthostatic or vasovagal type symptomatology accompanied by his pain.  His EKG is reassuring and demonstrates no cardiac symptoms. ? ?Patient  is anticoagulated and has known history of A-fib. ? ?The patient is on the cardiac monitor to evaluate for evidence of arrhythmia and/or significant heart rate changes. ? ? ?Clinical Course as of 09/12/21 1726  ?Sun Sep 12, 2021  ?1446 Personally interpreted and viewed the patient's CT scan of the abdomen pelvis, I do not see evidence of acute gross pathology. ? ?Radiologist report reviewed as below: ?IMPRESSION:  ?No evidence of urolithiasis, hydronephrosis, or other acute  ?findings. Foley catheter in appropriate position.  ? ?Mild sigmoid diverticulosis. No radiographic evidence of  ?diverticulitis.  [MQ]  ?1534 Patient does not have any obvious acute infectious symptoms suggest a worsening UTI.  Sent for culture.  He is currently on antibiotic treatment as well. [MQ]  ?1551 Patient sleeping on room entry.  Does not appear to be in any distress.  Patient reports intermittent episodes of pain, lower in his pelvis midline.  At this point given his work-up to this point is reassuring, I suspect he is likely having bladder spasms.  We will trial oxybutynin [MQ]  ?1551 Labs reviewed notable for slightly increased CO2.  Normal CBC save  a minor anemia.  Urinalysis with hematuria [MQ]  ?1552  rare bacteria but no leukocytes or nitrates [MQ]  ?1722 Discussed case and care with Ennis Forts, (guardian). Understands treatment, labs, and plan for discharge.

## 2021-09-13 ENCOUNTER — Other Ambulatory Visit: Payer: Self-pay

## 2021-09-13 ENCOUNTER — Emergency Department: Payer: Medicaid Other

## 2021-09-13 ENCOUNTER — Encounter: Payer: Self-pay | Admitting: Intensive Care

## 2021-09-13 ENCOUNTER — Emergency Department
Admission: EM | Admit: 2021-09-13 | Discharge: 2021-09-13 | Disposition: A | Discharge status: Home / Self Care | Payer: Medicaid Other | Source: Home / Self Care | Attending: Emergency Medicine | Admitting: Emergency Medicine

## 2021-09-13 DIAGNOSIS — I10 Essential (primary) hypertension: Secondary | ICD-10-CM | POA: Insufficient documentation

## 2021-09-13 DIAGNOSIS — R0789 Other chest pain: Secondary | ICD-10-CM | POA: Insufficient documentation

## 2021-09-13 DIAGNOSIS — E119 Type 2 diabetes mellitus without complications: Secondary | ICD-10-CM | POA: Insufficient documentation

## 2021-09-13 HISTORY — DX: Type 2 diabetes mellitus without complications: E11.9

## 2021-09-13 LAB — CBC
HCT: 45.8 % (ref 39.0–52.0)
Hemoglobin: 14.8 g/dL (ref 13.0–17.0)
MCH: 28.5 pg (ref 26.0–34.0)
MCHC: 32.3 g/dL (ref 30.0–36.0)
MCV: 88.1 fL (ref 80.0–100.0)
Platelets: 237 10*3/uL (ref 150–400)
RBC: 5.2 MIL/uL (ref 4.22–5.81)
RDW: 13.8 % (ref 11.5–15.5)
WBC: 8.2 10*3/uL (ref 4.0–10.5)
nRBC: 0 % (ref 0.0–0.2)

## 2021-09-13 LAB — BASIC METABOLIC PANEL
Anion gap: 9 (ref 5–15)
BUN: 20 mg/dL (ref 6–20)
CO2: 29 mmol/L (ref 22–32)
Calcium: 9.2 mg/dL (ref 8.9–10.3)
Chloride: 97 mmol/L — ABNORMAL LOW (ref 98–111)
Creatinine, Ser: 0.9 mg/dL (ref 0.61–1.24)
GFR, Estimated: >60 mL/min (ref >60)
Glucose, Bld: 113 mg/dL — ABNORMAL HIGH (ref 70–99)
Potassium: 4.7 mmol/L (ref 3.5–5.1)
Sodium: 135 mmol/L (ref 135–145)

## 2021-09-13 LAB — TROPONIN I (HIGH SENSITIVITY): Troponin I (High Sensitivity): 3 ng/L (ref <18)

## 2021-09-13 LAB — URINE CULTURE: Culture: NO GROWTH

## 2021-09-13 MED ORDER — KETOROLAC TROMETHAMINE 30 MG/ML IJ SOLN
30.0000 mg | Freq: Once | INTRAMUSCULAR | Status: AC
  Administered 2021-09-13: 30 mg via INTRAMUSCULAR
  Filled 2021-09-13: qty 1

## 2021-09-13 NOTE — ED Triage Notes (Addendum)
Patient c/o chest pain that radiates to his back. Seen for same yesterday. Legal guardian [name] contacted and she recommended discharging him from triage and stating "this will be a everyday occurrence." Patient has foley catheter placed. C/o pain with catheter at tip of penis. Noted to be draining appropriately.  ?

## 2021-09-13 NOTE — ED Triage Notes (Signed)
FIRST NURSE NOTE:  ?Pt via EMS from [place]. Pt here for CP, pt was seen and discharged yesterday for same. Pt is A&OX4 and NAD ? ?90s-130s a fib on the monitor with hx  ?138/87 ?96% on RA  ? ?

## 2021-09-15 ENCOUNTER — Ambulatory Visit (INDEPENDENT_AMBULATORY_CARE_PROVIDER_SITE_OTHER): Payer: Medicaid Other | Admitting: Urology

## 2021-09-15 ENCOUNTER — Emergency Department
Admission: EM | Admit: 2021-09-15 | Discharge: 2021-09-16 | Disposition: A | Discharge status: Home / Self Care | Payer: Medicaid Other | Attending: Emergency Medicine | Admitting: Emergency Medicine

## 2021-09-15 ENCOUNTER — Encounter: Payer: Self-pay | Admitting: Urology

## 2021-09-15 ENCOUNTER — Other Ambulatory Visit: Payer: Self-pay

## 2021-09-15 VITALS — BP 130/80 | HR 74 | Ht 77.0 in | Wt 380.0 lb

## 2021-09-15 DIAGNOSIS — I10 Essential (primary) hypertension: Secondary | ICD-10-CM | POA: Insufficient documentation

## 2021-09-15 DIAGNOSIS — R7989 Other specified abnormal findings of blood chemistry: Secondary | ICD-10-CM | POA: Insufficient documentation

## 2021-09-15 DIAGNOSIS — R109 Unspecified abdominal pain: Secondary | ICD-10-CM | POA: Insufficient documentation

## 2021-09-15 DIAGNOSIS — R319 Hematuria, unspecified: Secondary | ICD-10-CM | POA: Diagnosis present

## 2021-09-15 DIAGNOSIS — I4891 Unspecified atrial fibrillation: Secondary | ICD-10-CM | POA: Insufficient documentation

## 2021-09-15 DIAGNOSIS — Z79899 Other long term (current) drug therapy: Secondary | ICD-10-CM | POA: Insufficient documentation

## 2021-09-15 DIAGNOSIS — R55 Syncope and collapse: Secondary | ICD-10-CM | POA: Insufficient documentation

## 2021-09-15 DIAGNOSIS — E119 Type 2 diabetes mellitus without complications: Secondary | ICD-10-CM | POA: Insufficient documentation

## 2021-09-15 DIAGNOSIS — Z7901 Long term (current) use of anticoagulants: Secondary | ICD-10-CM | POA: Insufficient documentation

## 2021-09-15 DIAGNOSIS — N35812 Other urethral bulbous stricture, male: Secondary | ICD-10-CM

## 2021-09-15 DIAGNOSIS — N390 Urinary tract infection, site not specified: Secondary | ICD-10-CM | POA: Insufficient documentation

## 2021-09-15 LAB — URINALYSIS, ROUTINE W REFLEX MICROSCOPIC
Bacteria, UA: NONE SEEN
Bilirubin Urine: NEGATIVE
Glucose, UA: NEGATIVE mg/dL
Ketones, ur: NEGATIVE mg/dL
Nitrite: POSITIVE — AB
Protein, ur: 30 mg/dL — AB
RBC / HPF: >50 RBC/hpf — ABNORMAL HIGH (ref 0–5)
Specific Gravity, Urine: 1.014 (ref 1.005–1.030)
pH: 6 (ref 5.0–8.0)

## 2021-09-15 LAB — BASIC METABOLIC PANEL
Anion gap: 11 (ref 5–15)
BUN: 19 mg/dL (ref 6–20)
CO2: 30 mmol/L (ref 22–32)
Calcium: 8.9 mg/dL (ref 8.9–10.3)
Chloride: 97 mmol/L — ABNORMAL LOW (ref 98–111)
Creatinine, Ser: 1.21 mg/dL (ref 0.61–1.24)
GFR, Estimated: >60 mL/min (ref >60)
Glucose, Bld: 144 mg/dL — ABNORMAL HIGH (ref 70–99)
Potassium: 4.1 mmol/L (ref 3.5–5.1)
Sodium: 138 mmol/L (ref 135–145)

## 2021-09-15 LAB — CBC
HCT: 42.4 % (ref 39.0–52.0)
Hemoglobin: 13.8 g/dL (ref 13.0–17.0)
MCH: 28.7 pg (ref 26.0–34.0)
MCHC: 32.5 g/dL (ref 30.0–36.0)
MCV: 88.1 fL (ref 80.0–100.0)
Platelets: 246 10*3/uL (ref 150–400)
RBC: 4.81 MIL/uL (ref 4.22–5.81)
RDW: 14.3 % (ref 11.5–15.5)
WBC: 7.4 10*3/uL (ref 4.0–10.5)
nRBC: 0 % (ref 0.0–0.2)

## 2021-09-15 MED ORDER — MORPHINE SULFATE (PF) 4 MG/ML IV SOLN
4.0000 mg | Freq: Once | INTRAVENOUS | Status: AC
  Administered 2021-09-15: 4 mg via INTRAVENOUS
  Filled 2021-09-15: qty 1

## 2021-09-15 MED ORDER — SODIUM CHLORIDE 0.9 % IV BOLUS (SEPSIS)
1000.0000 mL | Freq: Once | INTRAVENOUS | Status: AC
  Administered 2021-09-15: 1000 mL via INTRAVENOUS

## 2021-09-15 MED ORDER — KETOROLAC TROMETHAMINE 30 MG/ML IJ SOLN
30.0000 mg | Freq: Once | INTRAMUSCULAR | Status: AC
  Administered 2021-09-15: 30 mg via INTRAVENOUS
  Filled 2021-09-15: qty 1

## 2021-09-15 MED ORDER — ONDANSETRON HCL 4 MG/2ML IJ SOLN
4.0000 mg | Freq: Once | INTRAMUSCULAR | Status: AC
  Administered 2021-09-15: 4 mg via INTRAVENOUS
  Filled 2021-09-15: qty 2

## 2021-09-15 MED ORDER — SODIUM CHLORIDE 0.9 % IV SOLN
2.0000 g | Freq: Once | INTRAVENOUS | Status: AC
  Administered 2021-09-15: 2 g via INTRAVENOUS
  Filled 2021-09-15: qty 20

## 2021-09-15 NOTE — Progress Notes (Signed)
Catheter Removal ? ?Patient is present today for a catheter removal.  1ml of water was drained from the balloon. A 16FR foley cath was removed from the bladder no complications were noted . Patient tolerated well. ? ?Performed by: Ples Specter CMA ? ?Follow up/ Additional notes:  4 week pvr ?

## 2021-09-15 NOTE — ED Triage Notes (Signed)
Pt had a catheter in place for the last 10 days. States he noticed dark blood in his urinary bag yesterday. Reports hematuria post catheter removal today. Pt had emesis times one in triage. Reports he is on blood thinners. ?

## 2021-09-15 NOTE — ED Provider Notes (Signed)
? ?[facility] ?Provider Note ? ? ? Event Date/Time  ? First MD Initiated Contact with Patient 09/15/21 2300   ?  (approximate) ? ? ?History  ? ?Hematuria ? ? ?HPI ? ?Daniel [name] is a 31 y.o. male with history of atrial fibrillation on Eliquis, hypertension, diabetes, hyperlipidemia, schizoaffective disorder who presents to the emergency department with complaints of penile discomfort, hematuria, dysuria.  Patient just underwent cystoscopy with urethral dilation and catheter placement with Dr. Bernardo Heater on 09/07/2021 secondary to bulbar urethral stricture.  States he just had his Foley catheter removed in the office today.  He states throughout this entire time he has had discomfort in his penis and hematuria but states that hematuria and pain got worse tonight.  He denies any fever but has had chills and 1 episode of vomiting.  Denies any diarrhea. ? ? ?History provided by patient. ? ? ? ?Past Medical History:  ?Diagnosis Date  ? Atrial fibrillation ([place])   ? Cardiomyopathy ([place])   ? a. 07/2021 Echo: EF 50%, mild LVH, nl RV size/function. No significant valvular disease.  ? Chest pain   ? Conversion disorder   ? Current use of long term anticoagulation   ? Diabetes mellitus without complication ([place])   ? Hyperlipidemia   ? Hypertension   ? OSA (obstructive sleep apnea)   ? Persistent atrial fibrillation and flutter ([place])   ? a. CHA2DS2VASc = 3-4  ? Schizoaffective disorder ([place])   ? Seizure disorder ([place])   ? TIA (transient ischemic attack)   ? Urethral stricture   ? a. 08/2021 s/p cystoscopy and urethral dilation.  ? ? ?Past Surgical History:  ?Procedure Laterality Date  ? CYSTOSCOPY WITH URETHRAL DILATATION N/A 09/07/2021  ? Procedure: CYSTOSCOPY WITH URETHRAL DILATATION CATHETER PLACEMENT;  Surgeon: Abbie Sons, MD;  Location: [facility] ORS;  Service: Urology;  Laterality: N/A;  ? ? ?MEDICATIONS:  ?Prior to Admission medications   ?Medication Sig Start Date End Date Taking? Authorizing  Provider  ?albuterol (VENTOLIN HFA) 108 (90 Base) MCG/ACT inhaler Inhale 2 puffs into the lungs every 6 (six) hours as needed for wheezing. 07/12/21   [provider]  ?allopurinol (ZYLOPRIM) 300 MG tablet Take 1 tablet (300 mg total) by mouth daily. 08/29/21   Loletha Grayer, MD  ?atorvastatin (LIPITOR) 80 MG tablet Take 80 mg by mouth daily. 05/11/21   [provider]  ?baclofen (LIORESAL) 10 MG tablet Take 1 tablet (10 mg total) by mouth 2 (two) times daily as needed for muscle spasms. Home med. 08/16/21   Enzo Bi, MD  ?ciprofloxacin (CIPRO) 500 MG tablet Take 1 tablet (500 mg total) by mouth 2 (two) times daily for 7 days. 09/10/21 09/17/21  Lorella Nimrod, MD  ?diltiazem (CARDIZEM CD) 360 MG 24 hr capsule Take 1 capsule (360 mg total) by mouth daily. 09/10/21   Lorella Nimrod, MD  ?divalproex (DEPAKOTE) 500 MG DR tablet Take 1,500 mg by mouth 2 (two) times daily. 1000 mg every morning and 1500 mg at bedtime 07/21/21   [provider]  ?ELIQUIS 5 MG TABS tablet Take 5 mg by mouth 2 (two) times daily. 06/16/21   [provider]  ?escitalopram (LEXAPRO) 10 MG tablet Take 20 mg by mouth daily. 06/16/21   [provider]  ?fluticasone (FLONASE) 50 MCG/ACT nasal spray Place 2 sprays into both nostrils 2 (two) times daily. 06/16/21   [provider]  ?ibuprofen (ADVIL) 800 MG tablet Take 1 tablet (800 mg total) by mouth every  8 (eight) hours as needed. 09/09/21   Lorella Nimrod, MD  ?INVEGA 9 MG 24 hr tablet Take 9 mg by mouth every morning. 07/21/21   [provider]  ?loratadine (CLARITIN) 10 MG tablet Take 10 mg by mouth daily. 06/16/21   [provider]  ?melatonin 3 MG TABS tablet Take 3 mg by mouth at bedtime. 06/08/21   [provider]  ?metoprolol tartrate (LOPRESSOR) 50 MG tablet Take 1 tablet (50 mg total) by mouth 2 (two) times daily. 09/09/21   Lorella Nimrod, MD  ?omeprazole (PRILOSEC) 20 MG capsule Take 20 mg by mouth daily.    [provider]  ?oxybutynin (DITROPAN) 5 MG tablet Take 1 tablet (5 mg total) by mouth 2 (two) times daily. 09/12/21   Delman Kitten, MD  ?phenazopyridine (PYRIDIUM) 100 MG tablet Take 1 tablet (100 mg total) by mouth 3 (three) times daily with meals. 09/09/21   Lorella Nimrod, MD  ?tamsulosin (FLOMAX) 0.4 MG CAPS capsule Take 1 capsule (0.4 mg total) by mouth daily. 09/10/21   Lorella Nimrod, MD  ? ? ?Physical Exam  ? ?Triage Vital Signs: ?ED Triage Vitals  ?Enc Vitals Group  ?   BP 09/15/21 1954 109/70  ?   Pulse Rate 09/15/21 1954 96  ?   Resp 09/15/21 1954 16  ?   Temp 09/15/21 1954 98.7 ?F (37.1 ?C)  ?   Temp Source 09/15/21 1954 Oral  ?   SpO2 09/15/21 1954 91 %  ?   Weight --   ?   Height --   ?   Head Circumference --   ?   Peak Flow --   ?   Pain Score 09/15/21 1959 10  ?   Pain Loc --   ?   Pain Edu? --   ?   Excl. in [place]? --   ? ? ?Most recent vital signs: ?Vitals:  ? 09/15/21 1954 09/16/21 0000  ?BP: 109/70 127/68  ?Pulse: 96 92  ?Resp: 16   ?Temp: 98.7 ?F (37.1 ?C)   ?SpO2: 91% 96%  ? ? ?CONSTITUTIONAL: Alert and oriented and responds appropriately to questions.  Bees.  Appears uncomfortable.  Afebrile.  Nontoxic. ?HEAD: Normocephalic, atraumatic ?EYES: Conjunctivae clear, pupils appear equal, sclera nonicteric ?ENT: normal nose; moist mucous membranes ?NECK: Supple, normal ROM ?CARD: RRR; S1 and S2 appreciated; no murmurs, no clicks, no rubs, no gallops ?RESP: Normal chest excursion without splinting or tachypnea; breath sounds clear and equal bilaterally; no wheezes, no rhonchi, no rales, no hypoxia or respiratory distress, speaking full sentences ?ABD/GI: Normal bowel sounds; non-distended; soft, diffusely tender throughout the abdomen without guarding or rebound ?GU:  Normal external genitalia, circumcised male, normal penile shaft, red blood at the urethral meatus, no testicular masses or tenderness on exam, no scrotal masses or swelling, no hernias appreciated, 2+ femoral pulses bilaterally; no perineal  erythema, warmth, subcutaneous air or crepitus; no high riding testicle, normal bilateral cremasteric reflex.  Chaperone present for exam. ?BACK: The back appears normal ?EXT: Normal ROM in all joints; no deformity noted, no edema; no cyanosis ?SKIN: Normal color for age and race; warm; no rash on exposed skin ?NEURO: Moves all extremities equally, normal speech ?PSYCH: The patient's mood and manner are appropriate. ? ? ?ED Results / Procedures / Treatments  ? ?LABS: ?(all labs ordered are listed, but only abnormal results are displayed) ?Labs Reviewed  ?URINALYSIS, ROUTINE W REFLEX MICROSCOPIC - Abnormal; Notable for the following components:  ?  Result Value  ? Color, Urine AMBER (*)   ? APPearance HAZY (*)   ? Hgb urine dipstick LARGE (*)   ? Protein, ur 30 (*)   ? Nitrite POSITIVE (*)   ? Leukocytes,Ua SMALL (*)   ? RBC / HPF >50 (*)   ? All other components within normal limits  ?BASIC METABOLIC PANEL - Abnormal; Notable for the following components:  ? Chloride 97 (*)   ? Glucose, Bld 144 (*)   ? All other components within normal limits  ?URINE CULTURE  ?CBC  ?HEPATIC FUNCTION PANEL  ?LIPASE, BLOOD  ? ? ? ?EKG: ? ? ?RADIOLOGY: ?My personal review and interpretation of imaging: CT scan shows no acute abnormality. ? ?I have personally reviewed all radiology reports.   ?CT ABDOMEN PELVIS W CONTRAST ? ?Result Date: 09/16/2021 ?CLINICAL DATA:  Abdominal pain. EXAM: CT ABDOMEN AND PELVIS WITH CONTRAST TECHNIQUE: Multidetector CT imaging of the abdomen and pelvis was performed using the standard protocol following bolus administration of intravenous contrast. RADIATION DOSE REDUCTION: This exam was performed according to the departmental dose-optimization program which includes automated exposure control, adjustment of the mA and/or kV according to patient size and/or use of iterative reconstruction technique. CONTRAST:  164mL OMNIPAQUE IOHEXOL 300 MG/ML  SOLN COMPARISON:  CT abdomen pelvis dated 09/12/2021.  FINDINGS: Lower chest: The visualized lung bases are clear. No intra-abdominal free air or free fluid. Hepatobiliary: Probable mild fatty liver. No intrahepatic biliary dilatation. The gallbladder is unremarkable.

## 2021-09-16 ENCOUNTER — Emergency Department: Payer: Medicaid Other

## 2021-09-16 ENCOUNTER — Encounter: Payer: Self-pay | Admitting: Emergency Medicine

## 2021-09-16 ENCOUNTER — Other Ambulatory Visit: Payer: Self-pay

## 2021-09-16 ENCOUNTER — Encounter: Payer: Self-pay | Admitting: Urology

## 2021-09-16 ENCOUNTER — Emergency Department
Admission: EM | Admit: 2021-09-16 | Discharge: 2021-09-16 | Disposition: A | Discharge status: Home / Self Care | Payer: Medicaid Other | Source: Home / Self Care | Attending: Emergency Medicine | Admitting: Emergency Medicine

## 2021-09-16 DIAGNOSIS — Z7901 Long term (current) use of anticoagulants: Secondary | ICD-10-CM | POA: Insufficient documentation

## 2021-09-16 DIAGNOSIS — E119 Type 2 diabetes mellitus without complications: Secondary | ICD-10-CM | POA: Insufficient documentation

## 2021-09-16 DIAGNOSIS — I4891 Unspecified atrial fibrillation: Secondary | ICD-10-CM | POA: Insufficient documentation

## 2021-09-16 DIAGNOSIS — R55 Syncope and collapse: Secondary | ICD-10-CM | POA: Insufficient documentation

## 2021-09-16 DIAGNOSIS — I10 Essential (primary) hypertension: Secondary | ICD-10-CM | POA: Insufficient documentation

## 2021-09-16 DIAGNOSIS — W19XXXA Unspecified fall, initial encounter: Secondary | ICD-10-CM

## 2021-09-16 DIAGNOSIS — R109 Unspecified abdominal pain: Secondary | ICD-10-CM | POA: Insufficient documentation

## 2021-09-16 LAB — COMPREHENSIVE METABOLIC PANEL
ALT: 12 U/L (ref 0–44)
AST: 22 U/L (ref 15–41)
Albumin: 3.3 g/dL — ABNORMAL LOW (ref 3.5–5.0)
Alkaline Phosphatase: 55 U/L (ref 38–126)
Anion gap: 8 (ref 5–15)
BUN: 20 mg/dL (ref 6–20)
CO2: 30 mmol/L (ref 22–32)
Calcium: 8.6 mg/dL — ABNORMAL LOW (ref 8.9–10.3)
Chloride: 98 mmol/L (ref 98–111)
Creatinine, Ser: 1.04 mg/dL (ref 0.61–1.24)
GFR, Estimated: >60 mL/min (ref >60)
Glucose, Bld: 120 mg/dL — ABNORMAL HIGH (ref 70–99)
Potassium: 5.4 mmol/L — ABNORMAL HIGH (ref 3.5–5.1)
Sodium: 136 mmol/L (ref 135–145)
Total Bilirubin: 0.7 mg/dL (ref 0.3–1.2)
Total Protein: 6.4 g/dL — ABNORMAL LOW (ref 6.5–8.1)

## 2021-09-16 LAB — HEPATIC FUNCTION PANEL
ALT: 15 U/L (ref 0–44)
AST: 18 U/L (ref 15–41)
Albumin: 3.6 g/dL (ref 3.5–5.0)
Alkaline Phosphatase: 66 U/L (ref 38–126)
Bilirubin, Direct: <0.1 mg/dL (ref 0.0–0.2)
Total Bilirubin: 0.6 mg/dL (ref 0.3–1.2)
Total Protein: 7.1 g/dL (ref 6.5–8.1)

## 2021-09-16 LAB — CBC WITH DIFFERENTIAL/PLATELET
Abs Immature Granulocytes: 0.32 10*3/uL — ABNORMAL HIGH (ref 0.00–0.07)
Basophils Absolute: 0.1 10*3/uL (ref 0.0–0.1)
Basophils Relative: 1 %
Eosinophils Absolute: 0.3 10*3/uL (ref 0.0–0.5)
Eosinophils Relative: 5 %
HCT: 39.7 % (ref 39.0–52.0)
Hemoglobin: 12.5 g/dL — ABNORMAL LOW (ref 13.0–17.0)
Immature Granulocytes: 4 %
Lymphocytes Relative: 28 %
Lymphs Abs: 2 10*3/uL (ref 0.7–4.0)
MCH: 28.6 pg (ref 26.0–34.0)
MCHC: 31.5 g/dL (ref 30.0–36.0)
MCV: 90.8 fL (ref 80.0–100.0)
Monocytes Absolute: 1 10*3/uL (ref 0.1–1.0)
Monocytes Relative: 14 %
Neutro Abs: 3.5 10*3/uL (ref 1.7–7.7)
Neutrophils Relative %: 48 %
Platelets: 226 10*3/uL (ref 150–400)
RBC: 4.37 MIL/uL (ref 4.22–5.81)
RDW: 14.1 % (ref 11.5–15.5)
WBC: 7.2 10*3/uL (ref 4.0–10.5)
nRBC: 0.3 % — ABNORMAL HIGH (ref 0.0–0.2)

## 2021-09-16 LAB — LIPASE, BLOOD: Lipase: 35 U/L (ref 11–51)

## 2021-09-16 LAB — TYPE AND SCREEN
ABO/RH(D): O POS
Antibody Screen: NEGATIVE

## 2021-09-16 LAB — TROPONIN I (HIGH SENSITIVITY): Troponin I (High Sensitivity): 5 ng/L (ref <18)

## 2021-09-16 LAB — PROTIME-INR
INR: 1 (ref 0.8–1.2)
Prothrombin Time: 13.1 seconds (ref 11.4–15.2)

## 2021-09-16 LAB — LACTIC ACID, PLASMA: Lactic Acid, Venous: 1.2 mmol/L (ref 0.5–1.9)

## 2021-09-16 LAB — VALPROIC ACID LEVEL: Valproic Acid Lvl: 72 ug/mL (ref 50.0–100.0)

## 2021-09-16 LAB — CBG MONITORING, ED: Glucose-Capillary: 124 mg/dL — ABNORMAL HIGH (ref 70–99)

## 2021-09-16 MED ORDER — CEPHALEXIN 500 MG PO CAPS: 500.0000 mg | ORAL_CAPSULE | Freq: Two times a day (BID) | ORAL | 0 refills | Status: DC

## 2021-09-16 MED ORDER — IOHEXOL 350 MG/ML SOLN
100.0000 mL | Freq: Once | INTRAVENOUS | Status: AC | PRN
  Administered 2021-09-16: 100 mL via INTRAVENOUS

## 2021-09-16 MED ORDER — CEPHALEXIN 500 MG PO CAPS
500.0000 mg | ORAL_CAPSULE | Freq: Once | ORAL | Status: AC
  Administered 2021-09-16: 500 mg via ORAL
  Filled 2021-09-16: qty 1

## 2021-09-16 MED ORDER — HYDROCODONE-ACETAMINOPHEN 5-325 MG PO TABS: 1.0000 | ORAL_TABLET | ORAL | 0 refills | Status: DC | PRN

## 2021-09-16 MED ORDER — ONDANSETRON 4 MG PO TBDP: 4.0000 mg | ORAL_TABLET | Freq: Four times a day (QID) | ORAL | 0 refills | Status: DC | PRN

## 2021-09-16 MED ORDER — IOHEXOL 300 MG/ML  SOLN
125.0000 mL | Freq: Once | INTRAMUSCULAR | Status: AC | PRN
  Administered 2021-09-16: 125 mL via INTRAVENOUS

## 2021-09-16 NOTE — Discharge Instructions (Signed)
We gave him a dose of his Keflex antibiotic that he was started on last night for his bladder infection.  Please continue this medication at home. ?

## 2021-09-16 NOTE — Discharge Instructions (Addendum)
I have sent pain medication and nausea medicine to your pharmacy as well as a prescription of Keflex which is an antibiotic.  You may start the Cipro that you have at home that was prescribed by your urologist but please do not take Keflex and Cipro together.  You only need 1 antibiotic at this time.  I recommend close follow-up with your primary care doctor as well as urology to ensure resolution of your UTI. ? ? ?You are being provided a prescription for opiates (also known as narcotics) for pain control.  Opiates can be addictive and should only be used when absolutely necessary for pain control when other alternatives do not work.  We recommend you only use them for the recommended amount of time and only as prescribed.  Please do not take with other sedative medications or alcohol.  Please do not drive, operate machinery, make important decisions while taking opiates.  Please note that these medications can be addictive and have high abuse potential.  Patients can become addicted to narcotics after only taking them for a few days.  Please keep these medications locked away from children, teenagers or any family members with history of substance abuse.  Narcotic pain medicine may also make you constipated.  You may use over-the-counter medications such as MiraLAX, Colace to prevent constipation.  If you become constipated, you may use over-the-counter enemas as needed.  Itching and nausea are also common side effects of narcotic pain medication.  If you develop uncontrolled vomiting or a rash, please stop these medications and seek medical care. ? ?

## 2021-09-16 NOTE — ED Provider Notes (Signed)
? ?[facility] ?Provider Note ? ? ? Event Date/Time  ? First MD Initiated Contact with Patient 09/16/21 1416   ?  (approximate) ? ? ?History  ? ?Fall and Loss of Consciousness ? ? ?HPI ? ?Daniel [name] is a 31 y.o. male  with history of atrial fibrillation on Eliquis, hypertension, diabetes, hyperlipidemia, schizoaffective disorder as well as recent urethral dilation and catheter placement on 4/11 due to bulbar urethral stricture having had Foley removed yesterday and being evaluated in the emergency room yesterday for some painful urination with blood in the urine discharged on Keflex and a prescription for Norco and Zofran who presents the EMS from his group home for evaluation of altered mental status and concern for a fall. ? ?Patient states he does not remember exactly what happened.  Per EMS he was found on the floor.  Per EMS he is normally on 4 L nasal cannula patient confirms this.  He is complaining of headache, neck pain, back pain, chest pain and abdominal pain.  He denies any fevers, cough, rash or focal extremity pain or weakness aside from some chronic left lower extremity weakness. ? ?On chart review it seems patient was admitted earlier this month for evaluation of syncope reassuring work-up having developed left-sided weakness over a week ago with negative stroke work-up with reportedly weakness resolving. ? ?  ? ? ?Physical Exam  ?Triage Vital Signs: ?ED Triage Vitals [09/16/21 1414]  ?Enc Vitals Group  ?   BP   ?   Pulse   ?   Resp   ?   Temp   ?   Temp src   ?   SpO2   ?   Weight (!) 380 lb (172.4 kg)  ?   Height 6\' 5"  (1.956 m)  ?   Head Circumference   ?   Peak Flow   ?   Pain Score 10  ?   Pain Loc   ?   Pain Edu?   ?   Excl. in GC?   ? ? ?Most recent vital signs: ?Vitals:  ? 09/16/21 1417  ?BP: 107/79  ?Pulse: 75  ?Resp: 18  ?Temp: 98.7 ?F (37.1 ?C)  ?SpO2: 93%  ? ? ?General: Awake, ill-appearing. ?CV:  Good peripheral perfusion.  2+ radial pulses.   Irregular. ?Resp:  Normal effort.  Clear bilaterally. ?Abd:  No distention.  Soft. ?Other:  2+ radial and DP pulses.  No obvious trauma to the extremities.  There is some tenderness over C and L-spine.  PERRLA.  EOMI.  Patient is weaker in his left lower extremity compared to the right but is able to move his toes.  He has full strength in his bilateral upper extremities. ? ? ?ED Results / Procedures / Treatments  ?Labs ?(all labs ordered are listed, but only abnormal results are displayed) ?Labs Reviewed  ?CBC WITH DIFFERENTIAL/PLATELET - Abnormal; Notable for the following components:  ?    Result Value  ? Hemoglobin 12.5 (*)   ? nRBC 0.3 (*)   ? Abs Immature Granulocytes 0.32 (*)   ? All other components within normal limits  ?COMPREHENSIVE METABOLIC PANEL - Abnormal; Notable for the following components:  ? Potassium 5.4 (*)   ? Glucose, Bld 120 (*)   ? Calcium 8.6 (*)   ? Total Protein 6.4 (*)   ? Albumin 3.3 (*)   ? All other components within normal limits  ?CBG MONITORING, ED - Abnormal; Notable for the following components:  ?  Glucose-Capillary 124 (*)   ? All other components within normal limits  ?PROTIME-INR  ?VALPROIC ACID LEVEL  ?LACTIC ACID, PLASMA  ?LACTIC ACID, PLASMA  ?TYPE AND SCREEN  ?TROPONIN I (HIGH SENSITIVITY)  ? ? ? ?EKG ? ?ECG is remarkable for A-fib with a ventricular rate of 87, otherwise unremarkable intervals without evidence of acute ischemia or significant arrhythmia. ? ? ?RADIOLOGY ? ? ?PROCEDURES: ? ?Critical Care performed: No ? ?.1-3 Lead EKG Interpretation ?Performed by: Gilles Chiquito, MD ?Authorized by: Gilles Chiquito, MD  ? ?  Interpretation: non-specific   ?  ECG rate assessment: normal   ?  Rhythm: atrial fibrillation   ?  Ectopy: none   ?  Conduction: normal   ? ?The patient is on the cardiac monitor to evaluate for evidence of arrhythmia and/or significant heart rate changes. ? ? ?MEDICATIONS ORDERED IN ED: ?Medications - No data to display ? ? ?IMPRESSION / MDM /  ASSESSMENT AND PLAN / ED COURSE  ?I reviewed the triage vital signs and the nursing notes. ?             ?               ? ?Differential diagnosis includes, but is not limited to fall secondary to syncope, seizure, arrhythmia, ACS, PE, polypharmacy, anemia, dehydration, metabolic derangements possible CVA I will patient reports he has chronic weakness in the upper extremity and does not seem to have any new focal deficits tested today to suggest a CVA. ? ?ECG is remarkable for A-fib with a ventricular rate of 87, otherwise unremarkable intervals without evidence of acute ischemia or significant arrhythmia.  Nonelevated troponin not suggestive of ACS or myocarditis.  We will plan to obtain a 2-hour repeat. ? ?CBC shows no ketosis and hemoglobin of 12.5 and normal platelets.  CMP is remarkable for K of 5.4 without any other significant lecture metabolic derangements.  INR is 1.  Depakote level is 72 therapeutic range. ? ?Care patient signed over to assuming father at approximately 1530.  Plan is to follow-up full trauma scans and reassess patient. ?  ? ? ?FINAL CLINICAL IMPRESSION(S) / ED DIAGNOSES  ? ?Final diagnoses:  ?Fall, initial encounter  ?Anticoagulated  ? ? ? ?Rx / DC Orders  ? ?ED Discharge Orders   ? ? None  ? ?  ? ? ? ?Note:  This document was prepared using Dragon voice recognition software and may include unintentional dictation errors. ?  ?Gilles Chiquito, MD ?09/16/21 1526 ? ?

## 2021-09-16 NOTE — ED Provider Notes (Signed)
? ?[facility] ?Provider Note ? ? ? Event Date/Time  ? First MD Initiated Contact with Patient 09/13/21 1237   ?  (approximate) ? ? ?History  ? ?Chest Pain ? ? ?HPI ? ?Daniel [name] is a 31 y.o. male with a history of atrial fibrillation, conversion disorder, diabetes, hypertension, schizoaffective disorder who presents with complaints of chest discomfort.  Patient was seen for similar complaint yesterday, had reassuring work-up at that time.  He states that he had left-sided chest pain, however now has resolved.  Denies shortness of breath or cough.  No fevers reported. ?  ? ? ?Physical Exam  ? ?Triage Vital Signs: ?ED Triage Vitals  ?Enc Vitals Group  ?   BP 09/13/21 1215 114/82  ?   Pulse Rate 09/13/21 1215 (!) 58  ?   Resp 09/13/21 1215 16  ?   Temp 09/13/21 1215 98.7 ?F (37.1 ?C)  ?   Temp Source 09/13/21 1215 Oral  ?   SpO2 09/13/21 1215 95 %  ?   Weight 09/13/21 1216 (!) 172.4 kg (380 lb)  ?   Height 09/13/21 1216 1.956 m (6\' 5" )  ?   Head Circumference --   ?   Peak Flow --   ?   Pain Score 09/13/21 1216 10  ?   Pain Loc --   ?   Pain Edu? --   ?   Excl. in [place]? --   ? ? ?Most recent vital signs: ?Vitals:  ? 09/13/21 1215 09/13/21 1352  ?BP: 114/82 110/68  ?Pulse: (!) 58 61  ?Resp: 16 14  ?Temp: 98.7 ?F (37.1 ?C) 98.7 ?F (37.1 ?C)  ?SpO2: 95% 95%  ? ? ? ?General: Awake, no distress.  ?CV:  Good peripheral perfusion.  No chest wall tenderness, irregular rhythm. ?Resp:  Normal effort.  ?Abd:  No distention.  ?Other:   ? ? ?ED Results / Procedures / Treatments  ? ?Labs ?(all labs ordered are listed, but only abnormal results are displayed) ?Labs Reviewed  ?BASIC METABOLIC PANEL - Abnormal; Notable for the following components:  ?    Result Value  ? Chloride 97 (*)   ? Glucose, Bld 113 (*)   ? All other components within normal limits  ?CBC  ?TROPONIN I (HIGH SENSITIVITY)  ? ? ? ?EKG ? ?ED ECG REPORT ?I, Lavonia Drafts, the attending physician, personally viewed and interpreted this  ECG. ? ?Date: 09/16/2021 ? ?Rhythm: Atrial fibrillation ?QRS Axis: normal ?Intervals: normal ?ST/T Wave abnormalities: normal ?Narrative Interpretation: no evidence of acute ischemia ? ? ? ?RADIOLOGY ?Chest x-ray reviewed by me, no acute abnormality ? ? ? ?PROCEDURES: ? ?Critical Care performed:  ? ?Procedures ? ? ?MEDICATIONS ORDERED IN ED: ?Medications  ?ketorolac (TORADOL) 30 MG/ML injection 30 mg (30 mg Intramuscular Given 09/13/21 1327)  ? ? ? ?IMPRESSION / MDM / ASSESSMENT AND PLAN / ED COURSE  ?I reviewed the triage vital signs and the nursing notes. ? ? ?Patient presents with complaints of chest pain as detailed above.  He does have a significant past medical history as noted above  ? ?His EKG today demonstrates atrial fibrillation, he is well-appearing and asymptomatic here in the emergency department.  His lab work ?Demonstrates normal high sensitive troponin, normal CBC and BMP ? ?Does not appear consistent with ACS, no pleurisy to suggest PE, unclear cause of discomfort, now resolved, recommend outpatient follow-up, return precautions discussed. ? ? ?  ? ? ?FINAL CLINICAL IMPRESSION(S) / ED DIAGNOSES  ? ?  Final diagnoses:  ?Atypical chest pain  ? ? ? ?Rx / DC Orders  ? ?ED Discharge Orders   ? ? None  ? ?  ? ? ? ?Note:  This document was prepared using Dragon voice recognition software and may include unintentional dictation errors. ?  ?Lavonia Drafts, MD ?09/16/21 1148 ? ?

## 2021-09-16 NOTE — ED Triage Notes (Signed)
Pt comes into the ED via [facility] from group home c/o possible fall.  Group home workers found him down in the bathroom.  Pt has a h/o stroke and seizures.  Pt sitting upright with EMS talking normally with negative NIH.  Pt then went unresponsive and no longer responding to EMS.  Pt was sternal rubbed with no response. Prior to going unresponsive with EMS, patient told them that he fell, hit his head on the wall and had a syncopal episode. Pt has previous left side deficits.  ? ?CBG 112 ?110/78 then changed to 48/26.   ?Equal and reactive pupils.  ?

## 2021-09-16 NOTE — ED Notes (Signed)
Pt placed on 3L [place] of oxygen at this time. States he always wears three liters at night. ?

## 2021-09-16 NOTE — ED Notes (Signed)
Pt BIBA for possible CVA. Per ems, 911 called by group home for fall with LOC. EMS states pt had normal VS, a/ox4, and on arrival to the ED became unresponsive. On arrival, pt does not arrouse to painful stimuli. Pupils PEARL. During assessment, pt begins to come around. Appears postictal.  ?

## 2021-09-16 NOTE — ED Notes (Signed)
OJ who is the caregiver was contacted to come pick up this pt to take him back to [facility]. Multiple attempts to contact legal guardian were unsuccessful.  ?

## 2021-09-16 NOTE — ED Notes (Signed)
Patient transported to CT 

## 2021-09-16 NOTE — ED Notes (Signed)
Legal guardian numbers called but went to voicemail. Pt's caregiver OJ called and states he could come pick him up in the morning.  ?

## 2021-09-16 NOTE — Progress Notes (Signed)
? ?09/15/2021 ?7:17 AM  ? ?[name] ?1990/08/22 ?[id number] ? ?Referring provider: Anselm Jungling, NP ?[phone] ?[place],  [place] [address] ? ?Chief Complaint  ?Patient presents with  ? Other  ? ? ?HPI: ?31 y.o. male presents for follow-up. ? ?Refer to Dr. Keane Scrape recent inpatient progress notes ?Underwent cystoscopy under anesthesia 09/07/2021 with findings of a bulbar urethral stricture.  He underwent dilation with catheter placement ?No problems since hospital discharge ? ? ?PMH: ?Past Medical History:  ?Diagnosis Date  ? Atrial fibrillation (HCC)   ? Cardiomyopathy (HCC)   ? a. 07/2021 Echo: EF 50%, mild LVH, nl RV size/function. No significant valvular disease.  ? Chest pain   ? Conversion disorder   ? Current use of long term anticoagulation   ? Diabetes mellitus without complication (HCC)   ? Hyperlipidemia   ? Hypertension   ? OSA (obstructive sleep apnea)   ? Persistent atrial fibrillation and flutter (HCC)   ? a. CHA2DS2VASc = 3-4  ? Schizoaffective disorder (HCC)   ? Seizure disorder (HCC)   ? TIA (transient ischemic attack)   ? Urethral stricture   ? a. 08/2021 s/p cystoscopy and urethral dilation.  ? ? ?Surgical History: ?Past Surgical History:  ?Procedure Laterality Date  ? CYSTOSCOPY WITH URETHRAL DILATATION N/A 09/07/2021  ? Procedure: CYSTOSCOPY WITH URETHRAL DILATATION CATHETER PLACEMENT;  Surgeon: Riki Altes, MD;  Location: [facility] ORS;  Service: Urology;  Laterality: N/A;  ? ? ?Home Medications:  ?Allergies as of 09/15/2021   ? ?   Reactions  ? Haldol [haloperidol] Other (See Comments)  ? SI  ? Abilify [aripiprazole] Palpitations  ? Demerol [meperidine Hcl] Hives  ? ?  ? ?  ?Medication List  ?  ? ?  ? Accurate as of September 15, 2021 11:59 PM. If you have any questions, ask your nurse or doctor.  ?  ?  ? ?  ? ?albuterol 108 (90 Base) MCG/ACT inhaler ?Commonly known as: VENTOLIN HFA ?Inhale 2 puffs into the lungs every 6 (six) hours as needed for wheezing. ?  ?allopurinol 300 MG  tablet ?Commonly known as: ZYLOPRIM ?Take 1 tablet (300 mg total) by mouth daily. ?  ?atorvastatin 80 MG tablet ?Commonly known as: LIPITOR ?Take 80 mg by mouth daily. ?  ?baclofen 10 MG tablet ?Commonly known as: LIORESAL ?Take 1 tablet (10 mg total) by mouth 2 (two) times daily as needed for muscle spasms. Home med. ?  ?cephALEXin 500 MG capsule ?Commonly known as: KEFLEX ?Take 1 capsule (500 mg total) by mouth 2 (two) times daily. ?  ?ciprofloxacin 500 MG tablet ?Commonly known as: CIPRO ?Take 1 tablet (500 mg total) by mouth 2 (two) times daily for 7 days. ?  ?diltiazem 360 MG 24 hr capsule ?Commonly known as: CARDIZEM CD ?Take 1 capsule (360 mg total) by mouth daily. ?  ?divalproex 500 MG DR tablet ?Commonly known as: DEPAKOTE ?Take 1,500 mg by mouth 2 (two) times daily. 1000 mg every morning and 1500 mg at bedtime ?  ?Eliquis 5 MG Tabs tablet ?Generic drug: apixaban ?Take 5 mg by mouth 2 (two) times daily. ?  ?escitalopram 10 MG tablet ?Commonly known as: LEXAPRO ?Take 20 mg by mouth daily. ?  ?fluticasone 50 MCG/ACT nasal spray ?Commonly known as: FLONASE ?Place 2 sprays into both nostrils 2 (two) times daily. ?  ?HYDROcodone-acetaminophen 5-325 MG tablet ?Commonly known as: NORCO/VICODIN ?Take 1 tablet by mouth every 4 (four) hours as needed. ?  ?ibuprofen 800 MG tablet ?Commonly  known as: ADVIL ?Take 1 tablet (800 mg total) by mouth every 8 (eight) hours as needed. ?  ?Invega 9 MG 24 hr tablet ?Generic drug: paliperidone ?Take 9 mg by mouth every morning. ?  ?loratadine 10 MG tablet ?Commonly known as: CLARITIN ?Take 10 mg by mouth daily. ?  ?melatonin 3 MG Tabs tablet ?Take 3 mg by mouth at bedtime. ?  ?metoprolol tartrate 50 MG tablet ?Commonly known as: LOPRESSOR ?Take 1 tablet (50 mg total) by mouth 2 (two) times daily. ?  ?omeprazole 20 MG capsule ?Commonly known as: PRILOSEC ?Take 20 mg by mouth daily. ?  ?ondansetron 4 MG disintegrating tablet ?Commonly known as: ZOFRAN-ODT ?Take 1 tablet (4 mg  total) by mouth every 6 (six) hours as needed for nausea or vomiting. ?  ?oxybutynin 5 MG tablet ?Commonly known as: DITROPAN ?Take 1 tablet (5 mg total) by mouth 2 (two) times daily. ?  ?phenazopyridine 100 MG tablet ?Commonly known as: PYRIDIUM ?Take 1 tablet (100 mg total) by mouth 3 (three) times daily with meals. ?  ?tamsulosin 0.4 MG Caps capsule ?Commonly known as: FLOMAX ?Take 1 capsule (0.4 mg total) by mouth daily. ?  ? ?  ? ? ?Allergies:  ?Allergies  ?Allergen Reactions  ? Haldol [Haloperidol] Other (See Comments)  ?  SI  ? Abilify [Aripiprazole] Palpitations  ? Demerol [Meperidine Hcl] Hives  ? ? ?Family History: ?History reviewed. No pertinent family history. ? ?Social History:  reports that he has quit smoking. His smoking use included cigarettes. He has never used smokeless tobacco. He reports that he does not currently use alcohol. He reports that he does not use drugs. ? ? ?Physical Exam: ?BP 130/80   Pulse 74   Ht 6\' 5"  (1.956 m)   Wt (!) 380 lb (172.4 kg)   BMI 45.06 kg/m?   ?Constitutional:  Alert,, No acute distress. ?GU: Foley catheter with clear urine ? ? ? ?Assessment & Plan:   ? ?1.  Bulbar urethral stricture status post dilation ?Foley catheter removed ?Instructed to call for voiding complaints ?PA follow-up 1 month for symptom check and PVR ? ? ? , MD ? ?[facility] ?[address] ?[place], [place] ?[phone] ? ? ?

## 2021-09-17 ENCOUNTER — Emergency Department
Admission: EM | Admit: 2021-09-17 | Discharge: 2021-09-17 | Disposition: A | Discharge status: Home / Self Care | Payer: Medicaid Other | Attending: Emergency Medicine | Admitting: Emergency Medicine

## 2021-09-17 ENCOUNTER — Other Ambulatory Visit: Payer: Self-pay

## 2021-09-17 ENCOUNTER — Emergency Department: Payer: Medicaid Other

## 2021-09-17 DIAGNOSIS — R519 Headache, unspecified: Secondary | ICD-10-CM | POA: Insufficient documentation

## 2021-09-17 DIAGNOSIS — R569 Unspecified convulsions: Secondary | ICD-10-CM | POA: Diagnosis not present

## 2021-09-17 DIAGNOSIS — E119 Type 2 diabetes mellitus without complications: Secondary | ICD-10-CM | POA: Insufficient documentation

## 2021-09-17 DIAGNOSIS — R251 Tremor, unspecified: Secondary | ICD-10-CM | POA: Diagnosis not present

## 2021-09-17 LAB — CBC WITH DIFFERENTIAL/PLATELET
Abs Immature Granulocytes: 0.46 10*3/uL — ABNORMAL HIGH (ref 0.00–0.07)
Basophils Absolute: 0.1 10*3/uL (ref 0.0–0.1)
Basophils Relative: 1 %
Eosinophils Absolute: 0.1 10*3/uL (ref 0.0–0.5)
Eosinophils Relative: 1 %
HCT: 38 % — ABNORMAL LOW (ref 39.0–52.0)
Hemoglobin: 12 g/dL — ABNORMAL LOW (ref 13.0–17.0)
Immature Granulocytes: 6 %
Lymphocytes Relative: 21 %
Lymphs Abs: 1.7 10*3/uL (ref 0.7–4.0)
MCH: 28.8 pg (ref 26.0–34.0)
MCHC: 31.6 g/dL (ref 30.0–36.0)
MCV: 91.3 fL (ref 80.0–100.0)
Monocytes Absolute: 0.8 10*3/uL (ref 0.1–1.0)
Monocytes Relative: 10 %
Neutro Abs: 5.1 10*3/uL (ref 1.7–7.7)
Neutrophils Relative %: 61 %
Platelets: 179 10*3/uL (ref 150–400)
RBC: 4.16 MIL/uL — ABNORMAL LOW (ref 4.22–5.81)
RDW: 13.9 % (ref 11.5–15.5)
WBC: 8.2 10*3/uL (ref 4.0–10.5)
nRBC: 0 % (ref 0.0–0.2)

## 2021-09-17 LAB — COMPREHENSIVE METABOLIC PANEL
ALT: 14 U/L (ref 0–44)
AST: 17 U/L (ref 15–41)
Albumin: 3.3 g/dL — ABNORMAL LOW (ref 3.5–5.0)
Alkaline Phosphatase: 51 U/L (ref 38–126)
Anion gap: 9 (ref 5–15)
BUN: 18 mg/dL (ref 6–20)
CO2: 33 mmol/L — ABNORMAL HIGH (ref 22–32)
Calcium: 8.5 mg/dL — ABNORMAL LOW (ref 8.9–10.3)
Chloride: 96 mmol/L — ABNORMAL LOW (ref 98–111)
Creatinine, Ser: 0.89 mg/dL (ref 0.61–1.24)
GFR, Estimated: >60 mL/min (ref >60)
Glucose, Bld: 119 mg/dL — ABNORMAL HIGH (ref 70–99)
Potassium: 5 mmol/L (ref 3.5–5.1)
Sodium: 138 mmol/L (ref 135–145)
Total Bilirubin: 0.7 mg/dL (ref 0.3–1.2)
Total Protein: 6.5 g/dL (ref 6.5–8.1)

## 2021-09-17 LAB — URINALYSIS, ROUTINE W REFLEX MICROSCOPIC
Bacteria, UA: NONE SEEN
Bilirubin Urine: NEGATIVE
Glucose, UA: NEGATIVE mg/dL
Hgb urine dipstick: NEGATIVE
Ketones, ur: NEGATIVE mg/dL
Leukocytes,Ua: NEGATIVE
Nitrite: POSITIVE — AB
Protein, ur: NEGATIVE mg/dL
Specific Gravity, Urine: 1.026 (ref 1.005–1.030)
pH: 5 (ref 5.0–8.0)

## 2021-09-17 LAB — URINE CULTURE: Culture: NO GROWTH

## 2021-09-17 LAB — TROPONIN I (HIGH SENSITIVITY)
Troponin I (High Sensitivity): 6 ng/L (ref <18)
Troponin I (High Sensitivity): 5 ng/L (ref <18)

## 2021-09-17 LAB — VALPROIC ACID LEVEL: Valproic Acid Lvl: 94 ug/mL (ref 50.0–100.0)

## 2021-09-17 LAB — PROCALCITONIN: Procalcitonin: <0.1 ng/mL

## 2021-09-17 LAB — CK: Total CK: 88 U/L (ref 49–397)

## 2021-09-17 LAB — BRAIN NATRIURETIC PEPTIDE: B Natriuretic Peptide: 778.8 pg/mL — ABNORMAL HIGH (ref 0.0–100.0)

## 2021-09-17 MED ORDER — FUROSEMIDE 10 MG/ML IJ SOLN
40.0000 mg | Freq: Once | INTRAMUSCULAR | Status: AC
  Administered 2021-09-17: 40 mg via INTRAVENOUS
  Filled 2021-09-17: qty 4

## 2021-09-17 NOTE — ED Notes (Signed)
Pt and caregiver verbalized understanding of discharge instructions and follow-up care instructions. Pt advised if symptoms worsen to return to ED.  ?

## 2021-09-17 NOTE — ED Provider Notes (Addendum)
? ?[facility] ?Provider Note ? ? ? Event Date/Time  ? First MD Initiated Contact with Patient 09/17/21 1412   ?  (approximate) ? ? ?History  ? ?Seizures ("All morning") and Headache ? ? ?HPI ? ?Daniel [name] is a 31 y.o. male who comes in Saying he has a headache and seizures all morning long can cannot see.  He says he feels like he is breathing shallowly.  Patient followed finger in all fields of vision in triage.  His legal guardian [name] says he does not have any history of seizures and he is having attention seeking behavior. ? ?  ?Left-sided weakness ?Obesity, Class III, BMI 40-49.9 (morbid obesity) (HCC) ?Chronic atrial fibrillation (HCC) ?OSA (obstructive sleep apnea) ?Diabetes mellitus type 2, uncomplicated (HCC) ?Schizoaffective disorder, bipolar type (HCC) ?Chronic hypoxemic respiratory failure (HCC) ?Syncope and collapse ?History of gout ?Conversion disorder ?Urinary obstruction ?Inability to urinate ?Hyperkalemia ? ?Physical Exam  ? ?Triage Vital Signs: ?ED Triage Vitals  ?Enc Vitals Group  ?   BP 09/17/21 1320 102/78  ?   Pulse Rate 09/17/21 1320 76  ?   Resp 09/17/21 1320 18  ?   Temp 09/17/21 1320 97.7 ?F (36.5 ?C)  ?   Temp Source 09/17/21 1320 Oral  ?   SpO2 09/17/21 1320 94 %  ?   Weight 09/17/21 1322 (!) 379 lb 3.1 oz (172 kg)  ?   Height 09/17/21 1322 6\' 5"  (1.956 m)  ?   Head Circumference --   ?   Peak Flow --   ?   Pain Score 09/17/21 1322 10  ?   Pain Loc --   ?   Pain Edu? --   ?   Excl. in GC? --   ? ? ?Most recent vital signs: ?Vitals:  ? 09/17/21 1622 09/17/21 1641  ?BP:  103/62  ?Pulse: 64 (!) 58  ?Resp: 18 20  ?Temp:    ?SpO2: 99% 93%  ? ? ? ?General: Awake, no distress.  ?Head normocephalic atraumatic ?Eyes pupils equal round reactive to light extraocular movements intact ?CV:  Good peripheral perfusion.  No audible murmurs ?Resp:  Normal effort.  Lungs are clear ?Abd:  No distention.  Soft and nontender ?Extremities with no edema ?Neuro exam cranial  nerves II through XII are intact.  Visual fields were not checked though.  Finger-nose is normal bilaterally.  Motor strength is 5/5 throughout.  I am not seeing any tremors now at all.  Patient does not report any numbness. ? ? ?ED Results / Procedures / Treatments  ? ?Labs ?(all labs ordered are listed, but only abnormal results are displayed) ?Labs Reviewed  ?CBC WITH DIFFERENTIAL/PLATELET - Abnormal; Notable for the following components:  ?    Result Value  ? RBC 4.16 (*)   ? Hemoglobin 12.0 (*)   ? HCT 38.0 (*)   ? Abs Immature Granulocytes 0.46 (*)   ? All other components within normal limits  ?URINALYSIS, ROUTINE W REFLEX MICROSCOPIC - Abnormal; Notable for the following components:  ? Color, Urine AMBER (*)   ? APPearance CLEAR (*)   ? Nitrite POSITIVE (*)   ? All other components within normal limits  ?COMPREHENSIVE METABOLIC PANEL - Abnormal; Notable for the following components:  ? Chloride 96 (*)   ? CO2 33 (*)   ? Glucose, Bld 119 (*)   ? Calcium 8.5 (*)   ? Albumin 3.3 (*)   ? All other components within normal limits  ?VALPROIC  ACID LEVEL  ?CK  ?PROCALCITONIN  ? ? ? ?EKG ? ?EKG read interpreted by me shows a flutter rate of 55 normal axis no acute ST-T wave changes a flutter is old. ? ?RADIOLOGY ?Chest x-ray read by radiology reviewed by me shows cardiomegaly with pulmonary vascular congestion according to radiologist I will review the film agree with cardiomegaly enlarged ? ?Pulmonary vascular congestion.  Patient does not have any edema.  The patient does not have any crackles in his lungs.  He is not short of breath.  He is not hypoxic he is not having any chest pain. ? ? ?PROCEDURES: ? ?Critical Care performed:  ? ?Procedures ? ? ?MEDICATIONS ORDERED IN ED: ?Medications - No data to display ? ? ?IMPRESSION / MDM / ASSESSMENT AND PLAN / ED COURSE  ?I reviewed the triage vital signs and the nursing notes. ?Patient doing well now.  I will have him follow-up with primary care.  He will return if he  has any further problems.  He also needs to follow-up for the small amount of red blood cells he has in his urine.  Primary care to follow back ? ?Patient was complaining of inability to see the very obviously CBC reviewed no acute whatsoever.  Patient has had no tremors while I was watching him.  Has been a seizure behavior no history of epilepsy. ? ?The patient is on the cardiac monitor to evaluate for evidence of arrhythmia and/or significant heart rate changes. ? ?Clinical Course as of 09/17/21 1709  ?Fri Sep 17, 2021  ?1432 CT Head Wo Contrast [JM]  ?  ?Clinical Course User Index ?[JM] Menshew, Charlesetta Ivory, PA-C  ? ?Says that he has been on oxygen for hypoxia in the past but ran out of it 6 months ago.  Having trouble finding a lot of confirmation chart but EMS noted that his well today.  Patient's guardian says he does not need it it was stopped 6-year patient is lying down seems to have sleep apnea which makes sense given his body habitus he wakes up his sats were still low.  His chest x-ray did say that he had pulmonary vascular congestion.  I have ordered a BNP and a troponin to check on this.  I I have signed this out. ? ? ? ? ?FINAL CLINICAL IMPRESSION(S) / ED DIAGNOSES  ? ?Final diagnoses:  ?Nonintractable headache, unspecified chronicity pattern, unspecified headache type  ?Tremor  ? ? ? ?Rx / DC Orders  ? ?ED Discharge Orders   ? ? None  ? ?  ? ? ? ?Note:  This document was prepared using Dragon voice recognition software and may include unintentional dictation errors. ?  ?Arnaldo Natal, MD ?09/17/21 1711 ? ?  ?Arnaldo Natal, MD ?09/17/21 1729 ? ?

## 2021-09-17 NOTE — ED Notes (Signed)
Pt provided sandwich and water. Pt assisted in changing pants and given urinal ?

## 2021-09-17 NOTE — ED Notes (Addendum)
contacted legal guardian,  ?[place]   [phone]    ?Who confirms that pt does not wear chronic oxygen anymore, and has not for the last 6 months. EDP notified that pt oxygen saturation was 87% without it on arrival and that pt had been placed on 4 L [place] while in the ED. ?

## 2021-09-17 NOTE — ED Notes (Signed)
Pt OK to eat per Dr. Archie Balboa. This RN took pt a sandwich tray. Pt appears to be drowsy. Pt tipped sandwich tray over. This RN and Terrence Dupont, EDT attempting to move pt from bed to wheelchair so pt can be moved to room 26. Pt states that he is not able to stand on his own. John,RN assisted this RN and Terrence Dupont with getting pt into the wheelchair. Pt was taken to room 26 where he transferred himself from the wheelchair to the stretcher. Pt asking for another food tray and stating that he feels better. Pt states "I know I am hypoxic". Pt informed that we have him O2 and that his sats are good. Pt states that he needs to be on BiPap. Pt informed that there is no indication for Bipap at this time and that the Doctor would need to be the one to place order for Bipap. Pt was given sprite to drink.  ?

## 2021-09-17 NOTE — ED Notes (Signed)
Report from [facility]. Pt in NAD at this time ?

## 2021-09-17 NOTE — ED Triage Notes (Signed)
Pt to ED brought by Kevin Fenton from [facility] group home. Pt states "I have been having seizures all morning and I have a headache and I can't see anything".  ? ?Kevin Fenton brought into triage room to clarify. Pt also states has been having trouble breathing "it feels shallow".  ? ?Pt states all symptoms started last night after leaving this hospital and was seen here for same yesterday. ? ?Pt is following commands and follows finger in all fields of vision. ?

## 2021-09-17 NOTE — ED Notes (Signed)
Pt does not appear postictal at this time. Pt states has been taking seizure meds as prescribed. Information verified with Kevin Fenton from group home. ? ?Called legal guardian, [name]. She states he does not have real history of seizures and that these are attention seeking behaviors. She states he has never had diagnosis of epilepsy. ?

## 2021-09-17 NOTE — Discharge Instructions (Addendum)
CAT scan and lab work looked good.  Please follow-up with your regular doctor for recheck this coming week.  You have a little bit of blood seen under the microscope only in your urine.  Your primary care doctor will need to make sure that this is gone when he sees you.  Return for any further problems. ?

## 2021-09-17 NOTE — ED Notes (Signed)
Pt arrived on RA. Upon review of pt chart, it appears that he wears 4L [place] chronically. Pt confirms this. Confirms that he did not wear any en route to ED and frequently does this. 4L [place] placed on patient at this time. Oxygen saturations 95% at this time on [place]. ?

## 2021-09-17 NOTE — ED Notes (Signed)
EDP at bedside to re-assess. ?

## 2021-09-17 NOTE — ED Notes (Signed)
Report to Angela, RN

## 2021-09-19 ENCOUNTER — Other Ambulatory Visit: Payer: Self-pay

## 2021-09-19 ENCOUNTER — Emergency Department
Admission: EM | Admit: 2021-09-19 | Discharge: 2021-09-19 | Disposition: A | Discharge status: Home / Self Care | Payer: Medicaid Other | Attending: Emergency Medicine | Admitting: Emergency Medicine

## 2021-09-19 ENCOUNTER — Emergency Department: Payer: Medicaid Other

## 2021-09-19 ENCOUNTER — Encounter: Payer: Self-pay | Admitting: Intensive Care

## 2021-09-19 DIAGNOSIS — E119 Type 2 diabetes mellitus without complications: Secondary | ICD-10-CM | POA: Diagnosis not present

## 2021-09-19 DIAGNOSIS — R55 Syncope and collapse: Secondary | ICD-10-CM | POA: Diagnosis present

## 2021-09-19 DIAGNOSIS — R7989 Other specified abnormal findings of blood chemistry: Secondary | ICD-10-CM | POA: Diagnosis not present

## 2021-09-19 DIAGNOSIS — Z7901 Long term (current) use of anticoagulants: Secondary | ICD-10-CM | POA: Diagnosis not present

## 2021-09-19 DIAGNOSIS — I10 Essential (primary) hypertension: Secondary | ICD-10-CM | POA: Insufficient documentation

## 2021-09-19 DIAGNOSIS — J9611 Chronic respiratory failure with hypoxia: Secondary | ICD-10-CM | POA: Diagnosis not present

## 2021-09-19 DIAGNOSIS — I4891 Unspecified atrial fibrillation: Secondary | ICD-10-CM | POA: Insufficient documentation

## 2021-09-19 DIAGNOSIS — R600 Localized edema: Secondary | ICD-10-CM | POA: Insufficient documentation

## 2021-09-19 LAB — COMPREHENSIVE METABOLIC PANEL
ALT: 13 U/L (ref 0–44)
AST: 14 U/L — ABNORMAL LOW (ref 15–41)
Albumin: 3.5 g/dL (ref 3.5–5.0)
Alkaline Phosphatase: 52 U/L (ref 38–126)
Anion gap: 8 (ref 5–15)
BUN: 24 mg/dL — ABNORMAL HIGH (ref 6–20)
CO2: 41 mmol/L — ABNORMAL HIGH (ref 22–32)
Calcium: 8.6 mg/dL — ABNORMAL LOW (ref 8.9–10.3)
Chloride: 86 mmol/L — ABNORMAL LOW (ref 98–111)
Creatinine, Ser: 1.04 mg/dL (ref 0.61–1.24)
GFR, Estimated: >60 mL/min (ref >60)
Glucose, Bld: 121 mg/dL — ABNORMAL HIGH (ref 70–99)
Potassium: 4.5 mmol/L (ref 3.5–5.1)
Sodium: 135 mmol/L (ref 135–145)
Total Bilirubin: 0.7 mg/dL (ref 0.3–1.2)
Total Protein: 7.2 g/dL (ref 6.5–8.1)

## 2021-09-19 LAB — CBC WITH DIFFERENTIAL/PLATELET
Abs Immature Granulocytes: 0.33 10*3/uL — ABNORMAL HIGH (ref 0.00–0.07)
Basophils Absolute: 0.1 10*3/uL (ref 0.0–0.1)
Basophils Relative: 1 %
Eosinophils Absolute: 0 10*3/uL (ref 0.0–0.5)
Eosinophils Relative: 0 %
HCT: 40.5 % (ref 39.0–52.0)
Hemoglobin: 12.8 g/dL — ABNORMAL LOW (ref 13.0–17.0)
Immature Granulocytes: 4 %
Lymphocytes Relative: 14 %
Lymphs Abs: 1.1 10*3/uL (ref 0.7–4.0)
MCH: 28.3 pg (ref 26.0–34.0)
MCHC: 31.6 g/dL (ref 30.0–36.0)
MCV: 89.6 fL (ref 80.0–100.0)
Monocytes Absolute: 0.7 10*3/uL (ref 0.1–1.0)
Monocytes Relative: 9 %
Neutro Abs: 5.4 10*3/uL (ref 1.7–7.7)
Neutrophils Relative %: 72 %
Platelets: 204 10*3/uL (ref 150–400)
RBC: 4.52 MIL/uL (ref 4.22–5.81)
RDW: 13.8 % (ref 11.5–15.5)
WBC: 7.5 10*3/uL (ref 4.0–10.5)
nRBC: 2.1 % — ABNORMAL HIGH (ref 0.0–0.2)

## 2021-09-19 LAB — URINE CULTURE: Culture: NO GROWTH

## 2021-09-19 LAB — BRAIN NATRIURETIC PEPTIDE: B Natriuretic Peptide: 629.9 pg/mL — ABNORMAL HIGH (ref 0.0–100.0)

## 2021-09-19 LAB — TROPONIN I (HIGH SENSITIVITY): Troponin I (High Sensitivity): 4 ng/L (ref <18)

## 2021-09-19 MED ORDER — FUROSEMIDE 10 MG/ML IJ SOLN
40.0000 mg | Freq: Once | INTRAMUSCULAR | Status: AC
Start: 2021-09-19 — End: 2021-09-19
  Administered 2021-09-19: 40 mg via INTRAVENOUS
  Filled 2021-09-19: qty 4

## 2021-09-19 MED ORDER — FUROSEMIDE 20 MG PO TABS: 20.0000 mg | ORAL_TABLET | Freq: Every day | ORAL | 0 refills | Status: DC

## 2021-09-19 NOTE — ED Notes (Signed)
OJ contacted by this RN that patient has been medically cleared to go back to group home. States that he will come pick up patient and bring back to group home. ?

## 2021-09-19 NOTE — ED Notes (Signed)
Patient transported to CT 

## 2021-09-19 NOTE — ED Triage Notes (Signed)
Patient arrived by EMS from group home for LOC. Fire reports patients RA was 35% upon arrival. Patient normally wears 4L continuous and fire reported patient had no oxygen on at the time of arrival. When EMS arrived patient was alert on non-rebreather. Pt c/o chest pain on left side that started yesterday. Reports cough with green phlegm. Patient states "I feel hypoxic"  ?

## 2021-09-19 NOTE — Discharge Instructions (Addendum)
It is very important that you wear your oxygen at all times especially when sleeping.  Please make sure you get to your sleep study as you likely need to be on CPAP at night.  I have started you on a diuretic to help with fluid in your lungs.  Please start taking this once daily.  It is important that you follow-up with your primary care provider to have your electrolytes rechecked in about 1 week given we are starting this medication. ?

## 2021-09-19 NOTE — ED Notes (Signed)
Patient discharged and this RN and tech at bedside to assist patient if needed. Patient was able to move all extremities on his own without distress. He was able to move his legs over the side of the stretcher and stand up. He then started moving his head and arms stating that he was having "tremors" and could not move himself. Throughout patient's encounter, patient was not having any tremor like activity and was able to stand on his own. Patient stopped his "tremors" when being wheeled to the lobby for his ride to pick him up. When his ride arrived, patient began to complain of tremors again and not being able to stand. When getting into vehicle, started to fall, but did stop himself and did not fall to the ground. Caregivers informed that patient was able to stand on his own and was able to maintain adequate O2 saturations without his oxygen. ?

## 2021-09-19 NOTE — ED Provider Notes (Signed)
? ?[facility] ?Provider Note ? ? ? Event Date/Time  ? First MD Initiated Contact with Patient 09/19/21 [phone]   ?  (approximate) ? ? ?History  ? ?Loss of Consciousness ? ? ?HPI ? ?Daniel NailGarland [name] is a 31 y.o. male with past medical history of atrial fibrillation on Eliquis, cardiomyopathy, OSA, chronic hypoxic respiratory failure on 4 L nasal cannula at baseline, seizure disorder who presents after an episode of unconsciousness.  Patient resides in a group home.  EMS was called out for CVA.  When fire rescue arrived patient was satting 35% minimally responsive breathing about 8 times per minute.  He was stimulated and by the time EMS arrived patient was awake.  Placed on a nonrebreather.  Patient complains of left-sided chest pain and says that he feels "hypoxic" tells me he is supposed to be on oxygen but does not have it at the group home, EMS notes that there was an oxygen concentrator at the home but he was not wearing it.  Has been told he has sleep apnea has not yet had a sleep study does not have CPAP.  Reviewed patient's most recent discharge summary from 4/13, during his hospitalization he was noted to have significant desaturation events but refused CPAP.  Scheduled to have a sleep study as an outpatient but has not had this yet.  Cardiology was consulted for his A-fib patient was started on digoxin, he is not eligible for any ablation until his underlying sleep apnea is addressed.  Patient has cough productive of green sputum denies fevers chills nausea vomiting or abdominal pain. ?  ? ?Past Medical History:  ?Diagnosis Date  ? Atrial fibrillation (HCC)   ? Cardiomyopathy (HCC)   ? a. 07/2021 Echo: EF 50%, mild LVH, nl RV size/function. No significant valvular disease.  ? Chest pain   ? Conversion disorder   ? Current use of long term anticoagulation   ? Diabetes mellitus without complication (HCC)   ? Hyperlipidemia   ? Hypertension   ? OSA (obstructive sleep apnea)   ? Persistent  atrial fibrillation and flutter (HCC)   ? a. CHA2DS2VASc = 3-4  ? Schizoaffective disorder (HCC)   ? Seizure disorder (HCC)   ? TIA (transient ischemic attack)   ? Urethral stricture   ? a. 08/2021 s/p cystoscopy and urethral dilation.  ? ? ?Patient Active Problem List  ? Diagnosis Date Noted  ? Hyperkalemia 09/08/2021  ? Atrial flutter (HCC)   ? Urinary obstruction 09/05/2021  ? Inability to urinate   ? Conversion disorder   ? Syncope and collapse 08/28/2021  ? History of gout 08/28/2021  ? Schizoaffective disorder, bipolar type (HCC) 08/14/2021  ? Chronic hypoxemic respiratory failure (HCC) 08/14/2021  ? OSA (obstructive sleep apnea)   ? Diabetes mellitus type 2, uncomplicated (HCC)   ? Left-sided weakness 08/02/2021  ? Obesity, Class III, BMI 40-49.9 (morbid obesity) (HCC) 08/02/2021  ? Chronic atrial fibrillation (HCC) 08/02/2021  ? ? ? ?Physical Exam  ?Triage Vital Signs: ?ED Triage Vitals  ?Enc Vitals Group  ?   BP 09/19/21 0944 (!) 137/97  ?   Pulse Rate 09/19/21 0944 98  ?   Resp 09/19/21 0944 (!) 26  ?   Temp 09/19/21 0944 97.6 ?F (36.4 ?C)  ?   Temp Source 09/19/21 0944 Oral  ?   SpO2 09/19/21 0944 94 %  ?   Weight 09/19/21 0948 (!) 383 lb (173.7 kg)  ?   Height 09/19/21 0948 6\' 5"  (  1.956 m)  ?   Head Circumference --   ?   Peak Flow --   ?   Pain Score --   ?   Pain Loc --   ?   Pain Edu? --   ?   Excl. in GC? --   ? ? ?Most recent vital signs: ?Vitals:  ? 09/19/21 0944  ?BP: (!) 137/97  ?Pulse: 98  ?Resp: (!) 26  ?Temp: 97.6 ?F (36.4 ?C)  ?SpO2: 94%  ? ? ? ?General: Awake, no distress.  ?CV:  Good peripheral perfusion.  Mild lower extreme edema bilaterally ?Resp:  Normal effort.  No increased work of breathing but patient is mildly tachypneic, difficult to hear lung sounds due to habitus ?Abd:  No distention.  ?Neuro:             Awake, Alert, Oriented x 3  ?Other:   ? ? ?ED Results / Procedures / Treatments  ?Labs ?(all labs ordered are listed, but only abnormal results are displayed) ?Labs Reviewed   ?COMPREHENSIVE METABOLIC PANEL - Abnormal; Notable for the following components:  ?    Result Value  ? Chloride 86 (*)   ? CO2 41 (*)   ? Glucose, Bld 121 (*)   ? BUN 24 (*)   ? Calcium 8.6 (*)   ? AST 14 (*)   ? All other components within normal limits  ?CBC WITH DIFFERENTIAL/PLATELET - Abnormal; Notable for the following components:  ? Hemoglobin 12.8 (*)   ? nRBC 2.1 (*)   ? Abs Immature Granulocytes 0.33 (*)   ? All other components within normal limits  ?BRAIN NATRIURETIC PEPTIDE - Abnormal; Notable for the following components:  ? B Natriuretic Peptide 629.9 (*)   ? All other components within normal limits  ?TROPONIN I (HIGH SENSITIVITY)  ?TROPONIN I (HIGH SENSITIVITY)  ? ? ? ?EKG ? ?I reviewed patient's EKG which shows atrial fibrillation with LVH normal axis without acute ischemic changes ? ? ?RADIOLOGY ?Reviewed the patient's chest x-ray which shows pulmonary vascular congestion ? ? ?PROCEDURES: ? ?Critical Care performed: No ? ?.1-3 Lead EKG Interpretation ?Performed by: Georga Hacking, MD ?Authorized by: Georga Hacking, MD  ? ?  Interpretation: abnormal   ?  ECG rate assessment: normal   ?  Rhythm: atrial fibrillation   ?  Ectopy: none   ?  Conduction: normal   ? ?The patient is on the cardiac monitor to evaluate for evidence of arrhythmia and/or significant heart rate changes. ? ? ?MEDICATIONS ORDERED IN ED: ?Medications  ?furosemide (LASIX) injection 40 mg (has no administration in time range)  ? ? ? ?IMPRESSION / MDM / ASSESSMENT AND PLAN / ED COURSE  ?I reviewed the triage vital signs and the nursing notes. ?             ?               ? ?Differential diagnosis includes, but is not limited to, CHF exacerbation, OSA, hypercarbia, ACS, pulmonary hypertension, pneumonia ? ?Patient is a 31 year old male with multiple comorbidities multiple recent ED visits presents with abnormal mental status.  Apparently was found minimally responsive.  Was hypoxic with fire department to 35%.  After  stimulation being placed on his O2 mental status improved.  On arrival to the ED patient placed back on 4 L nasal cannula he is alert and oriented mentating normally.  Tells me that he feels "hypoxic".  Difficult volume exam given his size lung sounds  not specific.  He has pulmonary vascular congestion on chest x-ray.  BNP is elevated troponin is negative.  EKG showing A-fib.  Patient has had several ED visits with similar left-sided chest pain my suspicion for ACS is low he is anticoagulated so PE less likely.  Cannot tell me he does not have his oxygen at the place where he is living.  I spoke with Kevin Fenton with alternative family living where he resides and he tells me that he does indeed have the oxygen but often refuses to wear it.  Reviewed most recent admission patient was on CPAP at night but frequently refusing it also requiring 2 to 4 L nasal cannula.  He is scheduled for sleep study next month. ? ?Today patient's bicarb is 41 indicating he is likely chronically hyper hypercarbic.  Given his normal mental status do not feel need to check VBG.  He is saturating normally on his baseline 4 L nasal cannula and I am reassured that he has access to this at his home.  Ultimately I suspect that patient's noncompliance with oxygen and underlying sleep apnea are contributing to his presentation.  He has a sleep study next month.  Will discharge back to group home.  We will give a dose of IV Lasix in the ED.  Do not see that he is on a diuretic.  With the pulmonary vascular congestion on chest x-ray today and elevated BNP we will start him on 20 of Lasix.  Discussed following up for repeat BMP in about 1 week as well as his cardiologist. ?  ? ? ?FINAL CLINICAL IMPRESSION(S) / ED DIAGNOSES  ? ?Final diagnoses:  ?Chronic respiratory failure with hypoxia (HCC)  ? ? ? ?Rx / DC Orders  ? ?ED Discharge Orders   ? ?      Ordered  ?  furosemide (LASIX) 20 MG tablet  Daily       ? 09/19/21 1149  ? ?  ?  ? ?  ? ? ? ?Note:  This  document was prepared using Dragon voice recognition software and may include unintentional dictation errors. ?  ?Georga Hacking, MD ?09/19/21 1150 ? ?

## 2021-09-20 ENCOUNTER — Emergency Department
Admission: EM | Admit: 2021-09-20 | Discharge: 2021-09-20 | Disposition: A | Discharge status: Home / Self Care | Payer: Medicaid Other | Attending: Emergency Medicine | Admitting: Emergency Medicine

## 2021-09-20 ENCOUNTER — Other Ambulatory Visit: Payer: Self-pay

## 2021-09-20 ENCOUNTER — Emergency Department: Payer: Medicaid Other

## 2021-09-20 DIAGNOSIS — Z20822 Contact with and (suspected) exposure to covid-19: Secondary | ICD-10-CM | POA: Diagnosis not present

## 2021-09-20 DIAGNOSIS — Z7901 Long term (current) use of anticoagulants: Secondary | ICD-10-CM | POA: Diagnosis not present

## 2021-09-20 DIAGNOSIS — R0602 Shortness of breath: Secondary | ICD-10-CM | POA: Insufficient documentation

## 2021-09-20 DIAGNOSIS — R000 Tachycardia, unspecified: Secondary | ICD-10-CM | POA: Insufficient documentation

## 2021-09-20 DIAGNOSIS — E119 Type 2 diabetes mellitus without complications: Secondary | ICD-10-CM | POA: Insufficient documentation

## 2021-09-20 DIAGNOSIS — R0600 Dyspnea, unspecified: Secondary | ICD-10-CM

## 2021-09-20 LAB — COMPREHENSIVE METABOLIC PANEL
ALT: 12 U/L (ref 0–44)
AST: 14 U/L — ABNORMAL LOW (ref 15–41)
Albumin: 3.5 g/dL (ref 3.5–5.0)
Alkaline Phosphatase: 43 U/L (ref 38–126)
Anion gap: 8 (ref 5–15)
BUN: 22 mg/dL — ABNORMAL HIGH (ref 6–20)
CO2: 42 mmol/L — ABNORMAL HIGH (ref 22–32)
Calcium: 8.8 mg/dL — ABNORMAL LOW (ref 8.9–10.3)
Chloride: 89 mmol/L — ABNORMAL LOW (ref 98–111)
Creatinine, Ser: 0.95 mg/dL (ref 0.61–1.24)
GFR, Estimated: >60 mL/min (ref >60)
Glucose, Bld: 98 mg/dL (ref 70–99)
Potassium: 3.9 mmol/L (ref 3.5–5.1)
Sodium: 139 mmol/L (ref 135–145)
Total Bilirubin: 0.9 mg/dL (ref 0.3–1.2)
Total Protein: 6.7 g/dL (ref 6.5–8.1)

## 2021-09-20 LAB — RESP PANEL BY RT-PCR (FLU A&B, COVID) ARPGX2
Influenza A by PCR: NEGATIVE
Influenza B by PCR: NEGATIVE
SARS Coronavirus 2 by RT PCR: NEGATIVE

## 2021-09-20 LAB — CBC
HCT: 40.7 % (ref 39.0–52.0)
Hemoglobin: 12.7 g/dL — ABNORMAL LOW (ref 13.0–17.0)
MCH: 28.5 pg (ref 26.0–34.0)
MCHC: 31.2 g/dL (ref 30.0–36.0)
MCV: 91.3 fL (ref 80.0–100.0)
Platelets: 194 10*3/uL (ref 150–400)
RBC: 4.46 MIL/uL (ref 4.22–5.81)
RDW: 13.8 % (ref 11.5–15.5)
WBC: 6.3 10*3/uL (ref 4.0–10.5)
nRBC: 2.6 % — ABNORMAL HIGH (ref 0.0–0.2)

## 2021-09-20 LAB — BRAIN NATRIURETIC PEPTIDE: B Natriuretic Peptide: 636.1 pg/mL — ABNORMAL HIGH (ref 0.0–100.0)

## 2021-09-20 LAB — TROPONIN I (HIGH SENSITIVITY): Troponin I (High Sensitivity): 12 ng/L (ref <18)
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
## 2021-09-20 NOTE — ED Provider Triage Note (Signed)
Emergency Medicine Provider Triage Evaluation Note ? ?Daniel [name] , a 31 y.o. male  was evaluated in triage.  Pt complains of shaking all over, difficulty breathing, hypoxic. ? ?Review of Systems  ?Positive: See above ?Negative:  ? ?Physical Exam  ?Ht 6\' 5"  (1.956 m)   Wt (!) 174 kg   BMI 45.49 kg/m?  ?Gen:   Awake, no distress   ?Resp:  Normal effort  ?MSK:   Moves extremities without difficulty  ?Other:   ? ?Medical Decision Making  ?Medically screening exam initiated at 5:39 PM.  Appropriate orders placed.  Jakavion Bilodeau was informed that the remainder of the evaluation will be completed by another provider, this initial triage assessment does not replace that evaluation, and the importance of remaining in the ED until their evaluation is complete. ? ? ?  ?[name], PA-C ?09/20/21 1740 ? ?

## 2021-09-20 NOTE — ED Notes (Signed)
Pt voided on himself.  Md in with pt.  Pt changed out of wet clothing and bedding changed.   ?

## 2021-09-20 NOTE — ED Notes (Signed)
Transportation stated they would be able to get the pt tonight. ?

## 2021-09-20 NOTE — ED Provider Notes (Addendum)
? ?[facility] ?Provider Note ? ? ? Event Date/Time  ? First MD Initiated Contact with Patient 09/20/21 1933   ?  (approximate) ? ?History  ? ?Chief Complaint: cannot sit still ? ?HPI ? ?Daniel [name] is a 31 y.o. male with a past medical history of atrial fibrillation on Eliquis, cardiomyopathy, diabetes, chronic hypoxia on 4 L nasal cannula, schizoaffective, presents to the emergency department for various complaints.  When I saw the patient he states he is feeling short of breath which is why he came, he then states he feels like he is jerking feels like his muscles are twitching at times.  Ultimately he states he just does not want to go back to his group home, states he wants to find a new group home.  Is asking if he can spend the night in the emergency department so he does not have to go back to his group home.  Currently the patient appears overall well, vitals are reassuring heart rate controlled at 73, O2 saturation 94% on his chronic 4 L. ? ?Physical Exam  ? ?Triage Vital Signs: ?ED Triage Vitals  ?Enc Vitals Group  ?   BP 09/20/21 1740 108/83  ?   Pulse Rate 09/20/21 1740 73  ?   Resp 09/20/21 1740 20  ?   Temp 09/20/21 1740 99.1 ?F (37.3 ?C)  ?   Temp Source 09/20/21 1740 Oral  ?   SpO2 09/20/21 1740 94 %  ?   Weight 09/20/21 1735 (!) 383 lb 9.6 oz (174 kg)  ?   Height 09/20/21 1735 6\' 5"  (1.956 m)  ?   Head Circumference --   ?   Peak Flow --   ?   Pain Score 09/20/21 1734 10  ?   Pain Loc --   ?   Pain Edu? --   ?   Excl. in GC? --   ? ? ?Most recent vital signs: ?Vitals:  ? 09/20/21 1740  ?BP: 108/83  ?Pulse: 73  ?Resp: 20  ?Temp: 99.1 ?F (37.3 ?C)  ?SpO2: 94%  ? ? ?General: Awake, no distress.  ?CV:  Good peripheral perfusion.  Regular rate and rhythm  ?Resp:  Normal effort.  Equal breath sounds bilaterally.  ?Abd:  No distention.  Soft, nontender.  No rebound or guarding. ?Other:  No significant lower extremity edema ? ? ?ED Results / Procedures / Treatments  ? ?EKG ? ?EKG  viewed and interpreted by myself shows atrial fibrillation 122 bpm with a narrow QRS, normal axis, normal intervals, no concerning ST changes. ? ?RADIOLOGY ? ?I personally viewed the chest x-ray images does appear to have mild cardiomegaly, no significant findings on my evaluation. ?Radiology is read the x-ray is negative ? ? ?MEDICATIONS ORDERED IN ED: ?Medications - No data to display ? ? ?IMPRESSION / MDM / ASSESSMENT AND PLAN / ED COURSE  ?I reviewed the triage vital signs and the nursing notes. ? ?Patient presents emergency department for various complaints including shortness of breath muscle twitching, body aches.  Patient admits that he does not want to go back to his group home and states he called EMS to bring him here hoping he could stay here instead of his group home.  Overall the patient appears well, is vital signs are reassuring however given his complaints of shortness of breath and his fairly complex history we will check labs including a BNP and troponin we will obtain an EKG and a chest x-ray.  The patient's  medical work-up is reassuring anticipate the patient could be discharged back to his group facility however I will also place a consult to social work so that I can attempt to help the patient remotely to see if there is any other resources available for the patient or possibly a new living arrangement. ? ?Patient's work-up is reassuring.  COVID/flu negative.  Chest x-ray is clear.  BNP is largely unchanged from historical values, troponin is negative.  CBC is normal.  Chemistry is normal.  EKG does show mild tachycardia however on recheck on vital signs patient's heart rate appears to be closer to 80-90.  Overall patient appears well I believe the patient is safe for discharge back to his current living facility however as mentioned I did contact social work hopefully they could inquire into the patient's current living facility to see what else could be offered for the patient. ? ?I spent a  significant amount of time in the patient's room speaking to the patient about all of his results tried to reassure him that everything looks well at the moment.  I did state I would refer the patient to neurology.  But given the patient's reassuring work-up I do not believe that there is necessarily an acute medical condition warranting admission to the hospital have placed a social work consult to see what they can do about his living situation but I do not feel he needs to stay in the emergency department overnight for this to occur.  The nurses spoken to the group facility they will be coming to pick him up shortly. ? ?FINAL CLINICAL IMPRESSION(S) / ED DIAGNOSES  ? ?Dyspnea ? ? ?Note:  This document was prepared using Dragon voice recognition software and may include unintentional dictation errors. ?  ?Minna Antis, MD ?09/20/21 2200 ? ?  ?Minna Antis, MD ?09/20/21 2250 ? ?

## 2021-09-20 NOTE — Discharge Instructions (Addendum)
Please follow-up with your doctor this week for recheck/reevaluation.  Return to the emergency department for any symptom personally concerning to yourself. ? ?Please follow-up with neurology by calling the number provided for Dr. Malvin Johns to arrange a follow-up appointment soon as possible. ? ?If you need a local primary care physician please call the number provided for [facility] clinic to arrange a local primary care doctor for ongoing management. ?
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
## 2021-09-24 ENCOUNTER — Observation Stay
Admission: EM | Admit: 2021-09-24 | Discharge: 2021-09-25 | Disposition: A | Discharge status: Home / Self Care | Payer: Medicaid Other | Attending: Obstetrics and Gynecology | Admitting: Obstetrics and Gynecology

## 2021-09-24 ENCOUNTER — Other Ambulatory Visit: Payer: Self-pay

## 2021-09-24 ENCOUNTER — Emergency Department: Payer: Medicaid Other

## 2021-09-24 DIAGNOSIS — I4891 Unspecified atrial fibrillation: Secondary | ICD-10-CM | POA: Insufficient documentation

## 2021-09-24 DIAGNOSIS — E724 Disorders of ornithine metabolism: Secondary | ICD-10-CM | POA: Insufficient documentation

## 2021-09-24 DIAGNOSIS — T426X4A Poisoning by other antiepileptic and sedative-hypnotic drugs, undetermined, initial encounter: Secondary | ICD-10-CM | POA: Insufficient documentation

## 2021-09-24 DIAGNOSIS — F250 Schizoaffective disorder, bipolar type: Secondary | ICD-10-CM | POA: Diagnosis not present

## 2021-09-24 DIAGNOSIS — F411 Generalized anxiety disorder: Secondary | ICD-10-CM | POA: Diagnosis present

## 2021-09-24 DIAGNOSIS — R4182 Altered mental status, unspecified: Principal | ICD-10-CM

## 2021-09-24 DIAGNOSIS — G9341 Metabolic encephalopathy: Principal | ICD-10-CM | POA: Diagnosis present

## 2021-09-24 DIAGNOSIS — I482 Chronic atrial fibrillation, unspecified: Secondary | ICD-10-CM | POA: Diagnosis present

## 2021-09-24 DIAGNOSIS — J9611 Chronic respiratory failure with hypoxia: Secondary | ICD-10-CM | POA: Diagnosis present

## 2021-09-24 DIAGNOSIS — E119 Type 2 diabetes mellitus without complications: Secondary | ICD-10-CM | POA: Diagnosis not present

## 2021-09-24 DIAGNOSIS — K7682 Hepatic encephalopathy: Secondary | ICD-10-CM

## 2021-09-24 DIAGNOSIS — T426X1A Poisoning by other antiepileptic and sedative-hypnotic drugs, accidental (unintentional), initial encounter: Secondary | ICD-10-CM | POA: Diagnosis present

## 2021-09-24 DIAGNOSIS — E7220 Disorder of urea cycle metabolism, unspecified: Secondary | ICD-10-CM | POA: Diagnosis present

## 2021-09-24 DIAGNOSIS — R0602 Shortness of breath: Secondary | ICD-10-CM | POA: Diagnosis present

## 2021-09-24 LAB — CBC
HCT: 42.3 % (ref 39.0–52.0)
Hemoglobin: 13.5 g/dL (ref 13.0–17.0)
MCH: 28.7 pg (ref 26.0–34.0)
MCHC: 31.9 g/dL (ref 30.0–36.0)
MCV: 89.8 fL (ref 80.0–100.0)
Platelets: 181 10*3/uL (ref 150–400)
RBC: 4.71 MIL/uL (ref 4.22–5.81)
RDW: 14.6 % (ref 11.5–15.5)
WBC: 6.3 10*3/uL (ref 4.0–10.5)
nRBC: 0 % (ref 0.0–0.2)

## 2021-09-24 LAB — BLOOD GAS, VENOUS
Acid-Base Excess: 20.3 mmol/L — ABNORMAL HIGH (ref 0.0–2.0)
Bicarbonate: 49.1 mmol/L — ABNORMAL HIGH (ref 20.0–28.0)
O2 Saturation: 59.3 %
Patient temperature: 37
pCO2, Ven: 74 mmHg (ref 44–60)
pH, Ven: 7.43 (ref 7.25–7.43)
pO2, Ven: 33 mmHg (ref 32–45)

## 2021-09-24 LAB — HEPATIC FUNCTION PANEL
ALT: 12 U/L (ref 0–44)
AST: 13 U/L — ABNORMAL LOW (ref 15–41)
Albumin: 3.5 g/dL (ref 3.5–5.0)
Alkaline Phosphatase: 45 U/L (ref 38–126)
Bilirubin, Direct: <0.1 mg/dL (ref 0.0–0.2)
Total Bilirubin: 0.6 mg/dL (ref 0.3–1.2)
Total Protein: 6.5 g/dL (ref 6.5–8.1)

## 2021-09-24 LAB — TROPONIN I (HIGH SENSITIVITY)
Troponin I (High Sensitivity): 4 ng/L (ref <18)
Troponin I (High Sensitivity): 3 ng/L (ref <18)

## 2021-09-24 LAB — CBG MONITORING, ED: Glucose-Capillary: 94 mg/dL (ref 70–99)

## 2021-09-24 LAB — VALPROIC ACID LEVEL
Valproic Acid Lvl: 119 ug/mL — ABNORMAL HIGH (ref 50.0–100.0)
Valproic Acid Lvl: 99 ug/mL (ref 50.0–100.0)

## 2021-09-24 LAB — BASIC METABOLIC PANEL
Anion gap: 5 (ref 5–15)
BUN: 14 mg/dL (ref 6–20)
CO2: 39 mmol/L — ABNORMAL HIGH (ref 22–32)
Calcium: 8.8 mg/dL — ABNORMAL LOW (ref 8.9–10.3)
Chloride: 95 mmol/L — ABNORMAL LOW (ref 98–111)
Creatinine, Ser: 1.1 mg/dL (ref 0.61–1.24)
GFR, Estimated: >60 mL/min (ref >60)
Glucose, Bld: 108 mg/dL — ABNORMAL HIGH (ref 70–99)
Potassium: 4.1 mmol/L (ref 3.5–5.1)
Sodium: 139 mmol/L (ref 135–145)

## 2021-09-24 LAB — MAGNESIUM: Magnesium: 2 mg/dL (ref 1.7–2.4)

## 2021-09-24 LAB — AMMONIA
Ammonia: 94 umol/L — ABNORMAL HIGH (ref 9–35)
Ammonia: 61 umol/L — ABNORMAL HIGH (ref 9–35)

## 2021-09-24 LAB — TSH: TSH: 3.237 u[IU]/mL (ref 0.350–4.500)

## 2021-09-24 MED ORDER — LORATADINE 10 MG PO TABS
10.0000 mg | ORAL_TABLET | Freq: Every day | ORAL | Status: DC
  Administered 2021-09-25: 10 mg via ORAL
  Filled 2021-09-24: qty 1

## 2021-09-24 MED ORDER — FUROSEMIDE 40 MG PO TABS
20.0000 mg | ORAL_TABLET | Freq: Every day | ORAL | Status: DC
  Administered 2021-09-24 – 2021-09-25 (×2): 20 mg via ORAL
  Filled 2021-09-24 (×2): qty 1

## 2021-09-24 MED ORDER — FLUTICASONE PROPIONATE 50 MCG/ACT NA SUSP
2.0000 | Freq: Two times a day (BID) | NASAL | Status: DC
  Filled 2021-09-24: qty 16

## 2021-09-24 MED ORDER — ALBUTEROL SULFATE (2.5 MG/3ML) 0.083% IN NEBU
2.5000 mg | INHALATION_SOLUTION | Freq: Four times a day (QID) | RESPIRATORY_TRACT | Status: DC | PRN
  Administered 2021-09-25: 2.5 mg via RESPIRATORY_TRACT
  Filled 2021-09-24: qty 3

## 2021-09-24 MED ORDER — APIXABAN 5 MG PO TABS
5.0000 mg | ORAL_TABLET | Freq: Two times a day (BID) | ORAL | Status: DC
  Administered 2021-09-24 – 2021-09-25 (×2): 5 mg via ORAL
  Filled 2021-09-24 (×2): qty 1

## 2021-09-24 MED ORDER — SODIUM CHLORIDE 0.9% FLUSH: 3.0000 mL | INTRAVENOUS | Status: DC | PRN

## 2021-09-24 MED ORDER — HYDROXYZINE HCL 10 MG PO TABS
10.0000 mg | ORAL_TABLET | Freq: Three times a day (TID) | ORAL | Status: DC | PRN
  Filled 2021-09-24: qty 1

## 2021-09-24 MED ORDER — CEPHALEXIN 500 MG PO CAPS
500.0000 mg | ORAL_CAPSULE | Freq: Two times a day (BID) | ORAL | Status: DC
  Administered 2021-09-25: 500 mg via ORAL
  Filled 2021-09-24: qty 1

## 2021-09-24 MED ORDER — MELATONIN 5 MG PO TABS
5.0000 mg | ORAL_TABLET | Freq: Every day | ORAL | Status: DC
  Filled 2021-09-24: qty 1

## 2021-09-24 MED ORDER — SODIUM CHLORIDE 0.9 % IV SOLN: 250.0000 mL | INTRAVENOUS | Status: DC | PRN

## 2021-09-24 MED ORDER — PALIPERIDONE ER 3 MG PO TB24
9.0000 mg | ORAL_TABLET | Freq: Every morning | ORAL | Status: DC
  Administered 2021-09-25: 9 mg via ORAL
  Filled 2021-09-24: qty 3

## 2021-09-24 MED ORDER — SODIUM CHLORIDE 0.9 % IV SOLN
6000.0000 mg | Freq: Once | INTRAVENOUS | Status: DC
  Filled 2021-09-24: qty 30

## 2021-09-24 MED ORDER — ATORVASTATIN CALCIUM 20 MG PO TABS
80.0000 mg | ORAL_TABLET | Freq: Every day | ORAL | Status: DC
  Administered 2021-09-25: 80 mg via ORAL
  Filled 2021-09-24: qty 4

## 2021-09-24 MED ORDER — PANTOPRAZOLE SODIUM 40 MG PO TBEC
40.0000 mg | DELAYED_RELEASE_TABLET | Freq: Every day | ORAL | Status: DC
  Administered 2021-09-24 – 2021-09-25 (×2): 40 mg via ORAL
  Filled 2021-09-24 (×2): qty 1

## 2021-09-24 MED ORDER — HYDROCODONE-ACETAMINOPHEN 5-325 MG PO TABS
1.0000 | ORAL_TABLET | ORAL | Status: DC | PRN
Start: 2021-09-24 — End: 2021-09-25
  Administered 2021-09-24 – 2021-09-25 (×2): 1 via ORAL
  Filled 2021-09-24 (×2): qty 1

## 2021-09-24 MED ORDER — ALLOPURINOL 300 MG PO TABS
300.0000 mg | ORAL_TABLET | Freq: Every day | ORAL | Status: DC
  Administered 2021-09-25: 300 mg via ORAL
  Filled 2021-09-24: qty 1

## 2021-09-24 MED ORDER — METOPROLOL TARTRATE 25 MG PO TABS
50.0000 mg | ORAL_TABLET | Freq: Two times a day (BID) | ORAL | Status: DC
  Administered 2021-09-25: 50 mg via ORAL
  Filled 2021-09-24: qty 2

## 2021-09-24 MED ORDER — LEVOCARNITINE 200 MG/ML IV SOLN: 6000.0000 mg | Freq: Once | INTRAVENOUS | Status: DC

## 2021-09-24 MED ORDER — BACLOFEN 10 MG PO TABS
10.0000 mg | ORAL_TABLET | Freq: Two times a day (BID) | ORAL | Status: DC | PRN
  Filled 2021-09-24: qty 1

## 2021-09-24 MED ORDER — DILTIAZEM HCL ER COATED BEADS 180 MG PO CP24
360.0000 mg | ORAL_CAPSULE | Freq: Every day | ORAL | Status: DC
  Filled 2021-09-24: qty 2

## 2021-09-24 MED ORDER — OXYBUTYNIN CHLORIDE 5 MG PO TABS
5.0000 mg | ORAL_TABLET | Freq: Two times a day (BID) | ORAL | Status: DC
  Administered 2021-09-24 – 2021-09-25 (×2): 5 mg via ORAL
  Filled 2021-09-24 (×2): qty 1

## 2021-09-24 MED ORDER — INSULIN ASPART 100 UNIT/ML IJ SOLN
0.0000 [IU] | Freq: Three times a day (TID) | INTRAMUSCULAR | Status: DC
  Administered 2021-09-25: 3 [IU] via SUBCUTANEOUS
  Filled 2021-09-24: qty 1

## 2021-09-24 MED ORDER — LACTULOSE 10 GM/15ML PO SOLN
20.0000 g | Freq: Once | ORAL | Status: AC
  Administered 2021-09-24: 20 g via ORAL
  Filled 2021-09-24: qty 30

## 2021-09-24 MED ORDER — ONDANSETRON HCL 4 MG/2ML IJ SOLN
4.0000 mg | Freq: Four times a day (QID) | INTRAMUSCULAR | Status: DC | PRN
Start: 2021-09-24 — End: 2021-09-25

## 2021-09-24 MED ORDER — PHENAZOPYRIDINE HCL 100 MG PO TABS
100.0000 mg | ORAL_TABLET | Freq: Three times a day (TID) | ORAL | Status: DC
  Administered 2021-09-24 – 2021-09-25 (×3): 100 mg via ORAL
  Filled 2021-09-24 (×5): qty 1

## 2021-09-24 MED ORDER — LACTULOSE 10 GM/15ML PO SOLN
20.0000 g | Freq: Two times a day (BID) | ORAL | Status: DC
  Administered 2021-09-25: 20 g via ORAL
  Filled 2021-09-24 (×2): qty 30

## 2021-09-24 MED ORDER — SODIUM CHLORIDE 0.9% FLUSH
3.0000 mL | Freq: Two times a day (BID) | INTRAVENOUS | Status: DC
  Administered 2021-09-25: 3 mL via INTRAVENOUS

## 2021-09-24 MED ORDER — ESCITALOPRAM OXALATE 10 MG PO TABS
20.0000 mg | ORAL_TABLET | Freq: Every day | ORAL | Status: DC
  Administered 2021-09-25: 20 mg via ORAL
  Filled 2021-09-24: qty 2

## 2021-09-24 MED ORDER — ONDANSETRON HCL 4 MG PO TABS: 4.0000 mg | ORAL_TABLET | Freq: Four times a day (QID) | ORAL | Status: DC | PRN

## 2021-09-24 NOTE — Assessment & Plan Note (Signed)
Secondary to valproic acid use ?We will place patient on lactulose twice daily ?

## 2021-09-24 NOTE — Assessment & Plan Note (Signed)
Patient with a known history of A-fib currently in a rapid ventricular rate ?Continue metoprolol and Cardizem and if not controlled we will start patient on a Cardizem drip ?Continue Eliquis as primary prophylaxis for an acute stroke ?

## 2021-09-24 NOTE — ED Provider Notes (Signed)
? ?[facility] ?Provider Note ? ? ? Event Date/Time  ? First MD Initiated Contact with Patient 09/24/21 1209   ?  (approximate) ? ? ?History  ? ?Chief Complaint ?Shortness of Breath ? ? ?HPI ? ?Daniel [name] is a 31 y.o. male with past medical history of hypertension, hyperlipidemia, diabetes, chronic atrial fibrillation on Eliquis, OSA, chronic respiratory failure on 4 L nasal cannula, and schizoaffective disorder who presents to the ED complaining of shortness of breath.  Patient reports that he has had increasing difficulty breathing for the past couple of days with a nonproductive cough.  He denies any fevers but reports sharp pain in the center of his chest that has been constant since yesterday.  He is concerned that he is retaining fluid and reports swelling in his legs, denies taking any fluid pill.  He has been seen for similar symptoms multiple times in the ED this week.  Per EMS, patient called EMS for shortness of breath after walking down the street without his oxygen. ?  ? ? ?Physical Exam  ? ?Triage Vital Signs: ?ED Triage Vitals  ?Enc Vitals Group  ?   BP 09/24/21 1122 103/69  ?   Pulse Rate 09/24/21 1122 98  ?   Resp 09/24/21 1122 (!) 23  ?   Temp 09/24/21 1122 99 ?F (37.2 ?C)  ?   Temp Source 09/24/21 1122 Oral  ?   SpO2 09/24/21 1122 94 %  ?   Weight 09/24/21 1125 (!) 391 lb (177.4 kg)  ?   Height 09/24/21 1125 6\' 5"  (1.956 m)  ?   Head Circumference --   ?   Peak Flow --   ?   Pain Score 09/24/21 1125 10  ?   Pain Loc --   ?   Pain Edu? --   ?   Excl. in GC? --   ? ? ?Most recent vital signs: ?Vitals:  ? 09/24/21 1122 09/24/21 1510  ?BP: 103/69 111/67  ?Pulse: 98 88  ?Resp: (!) 23 (!) 22  ?Temp: 99 ?F (37.2 ?C) 98.3 ?F (36.8 ?C)  ?SpO2: 94% 100%  ? ? ?Constitutional: Alert and oriented. ?Eyes: Conjunctivae are normal. ?Head: Atraumatic. ?Nose: No congestion/rhinnorhea. ?Mouth/Throat: Mucous membranes are moist.  ?Cardiovascular: Normal rate, irregularly irregular rhythm.  Grossly normal heart sounds.  2+ radial pulses bilaterally. ?Respiratory: Normal respiratory effort.  No retractions. Lungs CTAB. ?Gastrointestinal: Soft and nontender. No distention. ?Musculoskeletal: No lower extremity tenderness, trace pitting edema bilaterally. ?Neurologic:  Normal speech and language. No gross focal neurologic deficits are appreciated. ? ? ? ?ED Results / Procedures / Treatments  ? ?Labs ?(all labs ordered are listed, but only abnormal results are displayed) ?Labs Reviewed  ?VALPROIC ACID LEVEL - Abnormal; Notable for the following components:  ?    Result Value  ? Valproic Acid Lvl 119 (*)   ? All other components within normal limits  ?BASIC METABOLIC PANEL - Abnormal; Notable for the following components:  ? Chloride 95 (*)   ? CO2 39 (*)   ? Glucose, Bld 108 (*)   ? Calcium 8.8 (*)   ? All other components within normal limits  ?BLOOD GAS, VENOUS - Abnormal; Notable for the following components:  ? pCO2, Ven 74 (*)   ? Bicarbonate 49.1 (*)   ? Acid-Base Excess 20.3 (*)   ? All other components within normal limits  ?HEPATIC FUNCTION PANEL - Abnormal; Notable for the following components:  ? AST 13 (*)   ?  All other components within normal limits  ?AMMONIA - Abnormal; Notable for the following components:  ? Ammonia 94 (*)   ? All other components within normal limits  ?CBC  ?MAGNESIUM  ?TSH  ?TROPONIN I (HIGH SENSITIVITY)  ?TROPONIN I (HIGH SENSITIVITY)  ? ? ? ?EKG ? ?ED ECG REPORT ?Harriet Masson, the attending physician, personally viewed and interpreted this ECG. ? ? Date: 09/24/2021 ? EKG Time: 11:23 ? Rate: 138 ? Rhythm: atrial fibrillation ? Axis: Normal ? Intervals:none ? ST&T Change: Inferolateral ST/T changes ? ?RADIOLOGY ?Chest x-ray reviewed by me with no infiltrate, edema, or effusion. ? ?PROCEDURES: ? ?Critical Care performed: Yes, see critical care procedure note(s) ? ?.Critical Care ?Performed by: Chesley Noon, MD ?Authorized by: Chesley Noon, MD  ? ?Critical care  provider statement:  ?  Critical care time (minutes):  45 ?  Critical care time was exclusive of:  Separately billable procedures and treating other patients and teaching time ?  Critical care was necessary to treat or prevent imminent or life-threatening deterioration of the following conditions:  Toxidrome ?  Critical care was time spent personally by me on the following activities:  Development of treatment plan with patient or surrogate, discussions with consultants, evaluation of patient's response to treatment, examination of patient, ordering and review of laboratory studies, ordering and review of radiographic studies, ordering and performing treatments and interventions, pulse oximetry, re-evaluation of patient's condition and review of old charts ?  I assumed direction of critical care for this patient from another provider in my specialty: no   ?  Care discussed with: admitting provider   ? ? ?MEDICATIONS ORDERED IN ED: ?Medications  ?lactulose (CHRONULAC) 10 GM/15ML solution 20 g (has no administration in time range)  ?levOCARNitine (CARNITOR) 200 MG/ML 6,000 mg in sodium chloride 0.9 % 250 mL IVPB (has no administration in time range)  ? ? ? ?IMPRESSION / MDM / ASSESSMENT AND PLAN / ED COURSE  ?I reviewed the triage vital signs and the nursing notes. ?             ?               ? ?31 y.o. male with past medical history of hypertension, hyperlipidemia, diabetes, chronic atrial fibrillation on Eliquis, chronic respiratory failure on 4 L nasal cannula, OSA, and schizoaffective disorder who presents to the ED complaining of shortness of breath and chest pain for the past couple of days. ? ?Differential diagnosis includes, but is not limited to, ACS, PE, pneumonia, pneumothorax, COPD, asthma, CHF, chronic chest pain, chronic respiratory failure, malingering. ? ?Patient nontoxic-appearing and in no acute distress, he was initially found to be in atrial fibrillation with RVR in triage, however heart rate has  normalized without intervention by the time patient was placed in a room shortly thereafter.  Blood pressure is stable and he has a history of chronic atrial fibrillation.  Given his chest pain and shortness of breath, we will screen troponin and chest x-ray, observe on cardiac monitor.  He does not appear grossly fluid overloaded and is maintaining O2 sats on his usual 4 L nasal cannula.  It appears that he has been seen in the ED for similar complaints almost every day this week with unremarkable work-up.  He has been tested for COVID-19, which was negative.  CT head was performed and negative, also had CT imaging performed of his chest/abdomen/pelvis that was unremarkable.  No reason to suspect PE or dissection at this time given  reassuring vital signs and chronic complaints. ? ?Chest x-ray is unremarkable, 2 sets of troponin are negative and heart rate remains well controlled.  On reassessment, patient does appear quite somnolent and his Depakote level is noted to be elevated.  He is arousable to voice but Depakote toxicity could be contributing to his change in mental status.  Ammonia also noted to be elevated and Depakote could be contributing to hepatic toxicity.  We will treat with lactulose, case also discussed with poison control, who recommends treating with carnitine.  VBG shows elevated PCO2, however this is likely chronic with normal pH and we will hold off on BiPAP.  Case discussed with hospitalist for admission. ? ?  ? ? ?FINAL CLINICAL IMPRESSION(S) / ED DIAGNOSES  ? ?Final diagnoses:  ?Altered mental status, unspecified altered mental status type  ?Valproic acid toxicity, accidental or unintentional, initial encounter  ?Hepatic encephalopathy (HCC)  ? ? ? ?Rx / DC Orders  ? ?ED Discharge Orders   ? ? None  ? ?  ? ? ? ?Note:  This document was prepared using Dragon voice recognition software and may include unintentional dictation errors. ?  ?Chesley NoonJessup, Ceyda Peterka, MD ?09/24/21 1552 ? ?

## 2021-09-24 NOTE — Assessment & Plan Note (Addendum)
Patient noted to have elevated valproic acid level at 119 and elevated ammonia levels as well. ?He is slightly lethargic but arouses easily ?Hold Depakote ?Monitor patient closely ?Per poison control patient should only receive carnitine if ammonia level is greater than 100. ?We will trend valproic acid levels as well as ammonia levels every 8 hours ?

## 2021-09-24 NOTE — Consult Note (Addendum)
[facility] Face-to-Face Psychiatry Consult  ? ?Reason for Consult:  Psych Evaluation ?Referring Physician:  EDP ?Patient Identification: Daniel [name] ?MRN:  [id number] ?Principal Diagnosis: CHF ?Diagnosis:  General anxiety disorder ? ?Total Time spent with patient: 45 minutes ? ?Subjective:  "I need my hydroxyzine back." ? ?Client presented to the ED for breathing issues.  Consult placed for anxiety.  He denies suicidal ideations and he is known by this provider from previous visits in March.  His legal guardian reported at that time that he desires to be hospitalized frequently despite living at an AFL with no other clients with a 1:1 caregiver, history of chronic suicidal ideations.  He reports anxiety related to SOB and recently being on oxygen.  Requested hydroxyzine as it has helped him in the past, order placed.  No suicide intent or plan, no psychosis or substance abuse.  Encouraged him to continue with his outpatient practice and services.  Psychiatrically stable. ? ?Past Psychiatric History: schizoaffective disorder ? ?Risk to Self:  none ?Risk to Others:  none ?Prior Inpatient Therapy:  multiple ?Prior Outpatient Therapy:  in place ? ?Past Medical History:  ?Past Medical History:  ?Diagnosis Date  ? Atrial fibrillation (HCC)   ? Cardiomyopathy (HCC)   ? a. 07/2021 Echo: EF 50%, mild LVH, nl RV size/function. No significant valvular disease.  ? Chest pain   ? Conversion disorder   ? Current use of long term anticoagulation   ? Diabetes mellitus without complication (HCC)   ? Hyperlipidemia   ? Hypertension   ? OSA (obstructive sleep apnea)   ? Persistent atrial fibrillation and flutter (HCC)   ? a. CHA2DS2VASc = 3-4  ? Schizoaffective disorder (HCC)   ? Seizure disorder (HCC)   ? TIA (transient ischemic attack)   ? Urethral stricture   ? a. 08/2021 s/p cystoscopy and urethral dilation.  ?  ?Past Surgical History:  ?Procedure Laterality Date  ? CYSTOSCOPY WITH URETHRAL DILATATION N/A 09/07/2021  ? Procedure:  CYSTOSCOPY WITH URETHRAL DILATATION CATHETER PLACEMENT;  Surgeon: Riki Altes, MD;  Location: [facility] ORS;  Service: Urology;  Laterality: N/A;  ? ?Family History: No family history on file. ?Family Psychiatric  History: unknown ?Social History:  ?Social History  ? ?Substance and Sexual Activity  ?Alcohol Use Not Currently  ?   ?Social History  ? ?Substance and Sexual Activity  ?Drug Use Never  ?  ?Social History  ? ?Socioeconomic History  ? Marital status: Single  ?  Spouse name: Not on file  ? Number of children: Not on file  ? Years of education: Not on file  ? Highest education level: Not on file  ?Occupational History  ? Not on file  ?Tobacco Use  ? Smoking status: Former  ?  Types: Cigarettes  ? Smokeless tobacco: Never  ?Vaping Use  ? Vaping Use: Never used  ?Substance and Sexual Activity  ? Alcohol use: Not Currently  ? Drug use: Never  ? Sexual activity: Not on file  ?Other Topics Concern  ? Not on file  ?Social History Narrative  ? Now living in a group home in [place].  ? ?Social Determinants of Health  ? ?Financial Resource Strain: Not on file  ?Food Insecurity: Not on file  ?Transportation Needs: Not on file  ?Physical Activity: Not on file  ?Stress: Not on file  ?Social Connections: Not on file  ? ?Additional Social History: ?  ? ?Allergies:   ?Allergies  ?Allergen Reactions  ? Haldol [Haloperidol] Other (See Comments)  ?  SI  ? Abilify [Aripiprazole] Palpitations  ? Demerol [Meperidine Hcl] Hives  ? ? ?Labs:  ?Results for orders placed or performed during the hospital encounter of 09/24/21 (from the past 48 hour(s))  ?Troponin I (High Sensitivity)     Status: None  ? Collection Time: 09/24/21 11:37 AM  ?Result Value Ref Range  ? Troponin I (High Sensitivity) 4 <18 ng/L  ?  Comment: (NOTE) ?Elevated high sensitivity troponin I (hsTnI) values and significant  ?changes across serial measurements may suggest ACS but many other  ?chronic and acute conditions are known to elevate hsTnI results.  ?Refer to  the "Links" section for chest pain algorithms and additional  ?guidance. ?Performed at [facility], [address] , ?[place] [address] ?  ?CBC     Status: None  ? Collection Time: 09/24/21 12:15 PM  ?Result Value Ref Range  ? WBC 6.3 4.0 - 10.5 K/uL  ? RBC 4.71 4.22 - 5.81 MIL/uL  ? Hemoglobin 13.5 13.0 - 17.0 g/dL  ? HCT 42.3 39.0 - 52.0 %  ? MCV 89.8 80.0 - 100.0 fL  ? MCH 28.7 26.0 - 34.0 pg  ? MCHC 31.9 30.0 - 36.0 g/dL  ? RDW 14.6 11.5 - 15.5 %  ? Platelets 181 150 - 400 K/uL  ? nRBC 0.0 0.0 - 0.2 %  ?  Comment: Performed at [facility], [address], [place], [place] [address]  ?Magnesium     Status: None  ? Collection Time: 09/24/21 12:44 PM  ?Result Value Ref Range  ? Magnesium 2.0 1.7 - 2.4 mg/dL  ?  Comment: Performed at [facility], [address], [place], [place] [address]  ?TSH     Status: None  ? Collection Time: 09/24/21 12:44 PM  ?Result Value Ref Range  ? TSH 3.237 0.350 - 4.500 uIU/mL  ?  Comment: Performed by a 3rd Generation assay with a functional sensitivity of <=0.01 uIU/mL. ?Performed at [facility], [address] [place], [place] [address] ?  ?Valproic acid level     Status: Abnormal  ? Collection Time: 09/24/21 12:44 PM  ?Result Value Ref Range  ? Valproic Acid Lvl 119 (H) 50.0 - 100.0 ug/mL  ?  Comment: Performed at [facility], [address], [place], [place] [address]  ?Basic metabolic panel     Status: Abnormal  ? Collection Time: 09/24/21 12:44 PM  ?Result Value Ref Range  ? Sodium 139 135 - 145 mmol/L  ? Potassium 4.1 3.5 - 5.1 mmol/L  ? Chloride 95 (L) 98 - 111 mmol/L  ? CO2 39 (H) 22 - 32 mmol/L  ? Glucose, Bld 108 (H) 70 - 99 mg/dL  ?  Comment: Glucose reference range applies only to samples taken after fasting for at least 8 hours.  ? BUN 14 6 - 20 mg/dL  ? Creatinine, Ser 1.10 0.61 - 1.24 mg/dL  ? Calcium 8.8 (L) 8.9 - 10.3 mg/dL  ? GFR, Estimated >60 >60 mL/min  ?  Comment: (NOTE) ?Calculated using the CKD-EPI  Creatinine Equation (2021) ?  ? Anion gap 5 5 - 15  ?  Comment: Performed at [facility], [address], [place], [place] [address]  ?Troponin I (High Sensitivity)     Status: None  ? Collection Time: 09/24/21  2:12 PM  ?Result Value Ref Range  ? Troponin I (High Sensitivity) 3 <18 ng/L  ?  Comment: (NOTE) ?Elevated high sensitivity troponin I (hsTnI) values and significant  ?  changes across serial measurements may suggest ACS but many other  ?chronic and acute conditions are known to elevate hsTnI results.  ?Refer to the Links section for chest pain algorithms and additional  ?guidance. ?Performed at [facility], [address] [place], ?[place] [id number] ?  ?Blood gas, venous     Status: Abnormal  ? Collection Time: 09/24/21  2:12 PM  ?Result Value Ref Range  ? pH, Ven 7.43 7.25 - 7.43  ? pCO2, Ven 74 (HH) 44 - 60 mmHg  ?  Comment: RBV ?[facility] RN AT [address] [name] ELINSKI RRT ON [id number] ?  ? pO2, Ven 33 32 - 45 mmHg  ? Bicarbonate 49.1 (H) 20.0 - 28.0 mmol/L  ? Acid-Base Excess 20.3 (H) 0.0 - 2.0 mmol/L  ? O2 Saturation 59.3 %  ? Patient temperature 37.0   ? Collection site VENOUS   ?  Comment: Performed at [facility], [address] [place], [place] [address]  ?Hepatic function panel     Status: Abnormal  ? Collection Time: 09/24/21  2:12 PM  ?Result Value Ref Range  ? Total Protein 6.5 6.5 - 8.1 g/dL  ? Albumin 3.5 3.5 - 5.0 g/dL  ? AST 13 (L) 15 - 41 U/L  ? ALT 12 0 - 44 U/L  ? Alkaline Phosphatase 45 38 - 126 U/L  ? Total Bilirubin 0.6 0.3 - 1.2 mg/dL  ? Bilirubin, Direct <0.1 0.0 - 0.2 mg/dL  ? Indirect Bilirubin NOT CALCULATED 0.3 - 0.9 mg/dL  ?  Comment: Performed at [facility], [address] [place], [place] [address]  ?Ammonia     Status: Abnormal  ? Collection Time: 09/24/21  2:12 PM  ?Result Value Ref Range  ? Ammonia 94 (H) 9 - 35 umol/L  ?  Comment: Performed at [facility], [address] [place], [place] [address]  ? ? ?Current  Facility-Administered Medications  ?Medication Dose Route Frequency Provider Last Rate Last Admin  ? 0.9 %  sodium chloride infusion  250 mL Intravenous PRN Agbata, Tochukwu, MD      ? albuterol (VENTOLIN HFA) 10

## 2021-09-24 NOTE — ED Triage Notes (Signed)
Patient to ER via [facility] from [facility] group home in [place]. Patient contacted EMS with complaints of shortness of breath after walking down the street without his oxygen. Patient reports he wears 4L via [place] at baseline, states the concentrator had a yellow light on it. Group home contacted supplier and stated that there was no issue with the concentrator.  ? ?On EMS arrival- RA sats 92%. Diaphoretic. Placed on 4L improvement sats to 98%.  ? ?Patient also reports SI/ anxiety.   ? ?Per EMS, not to contact legal guardian anymore because she wants nothing to do with him. Mother 3hrs away.  ? ?Group home trying to move patient into assisted living.  ?

## 2021-09-24 NOTE — Progress Notes (Addendum)
MEDICATION RELATED CONSULT ? ?Pharmacy Consult for Levocarnitine for Valproic acid toxicity ? ?Allergies  ?Allergen Reactions  ? Haldol [Haloperidol] Other (See Comments)  ?  SI  ? Abilify [Aripiprazole] Palpitations  ? Demerol [Meperidine Hcl] Hives  ? ? ?Patient Measurements: ?Height: 6\' 5"  (195.6 cm) ?Weight: (!) 177.4 kg (391 lb) ?IBW/kg (Calculated) : 89.1 ? ? ?Labs: ?Recent Labs  ?  09/24/21 ?1215 09/24/21 ?1244 09/24/21 ?1412  ?WBC 6.3  --   --   ?HGB 13.5  --   --   ?HCT 42.3  --   --   ?PLT 181  --   --   ?CREATININE  --  1.10  --   ?MG  --  2.0  --   ?ALBUMIN  --   --  3.5  ?PROT  --   --  6.5  ?AST  --   --  13*  ?ALT  --   --  12  ?ALKPHOS  --   --  45  ?BILITOT  --   --  0.6  ?BILIDIR  --   --  <0.1  ?IBILI  --   --  NOT CALCULATED  ? ?Estimated Creatinine Clearance: 171.2 mL/min (by C-G formula based on SCr of 1.1 mg/dL). ? ? ? ?Medical History: ?Past Medical History:  ?Diagnosis Date  ? Atrial fibrillation (HCC)   ? Cardiomyopathy (HCC)   ? a. 07/2021 Echo: EF 50%, mild LVH, nl RV size/function. No significant valvular disease.  ? Chest pain   ? Conversion disorder   ? Current use of long term anticoagulation   ? Diabetes mellitus without complication (HCC)   ? Hyperlipidemia   ? Hypertension   ? OSA (obstructive sleep apnea)   ? Persistent atrial fibrillation and flutter (HCC)   ? a. CHA2DS2VASc = 3-4  ? Schizoaffective disorder (HCC)   ? Seizure disorder (HCC)   ? TIA (transient ischemic attack)   ? Urethral stricture   ? a. 08/2021 s/p cystoscopy and urethral dilation.  ? ? ?Medications:  ?PTA Depakote 1000 mg QAM and 1500 mg QHS ? ?Assessment/Plan: ?31 y.o. male with past medical history of hypertension, hyperlipidemia, diabetes, chronic atrial fibrillation on Eliquis, OSA, chronic respiratory failure on 4 L nasal cannula, and schizoaffective disorder who presented to the ED complaining of shortness of breath. Pharmacy consulted for levocarnitine for Valproic acid toxicity. ED provider note states  poison control recommended to treat but nurse note states poison controls says not to treat if ammonia level <100. Called poison control to confirm and spoke with 38 who did confirm not to treat if ammonia level is <100 and to monitor valproic acid and ammonia levels every 6-8 hours until within normal range. Ammonia level 94. Will discontinue levocarnitine and peripherally monitor ammonia/valproic acid levels. ? ? ?Daniel [name] O Eberardo Demello ?09/24/2021,4:26 PM ? ? ? ?

## 2021-09-24 NOTE — Assessment & Plan Note (Signed)
Continue Invega and Lexapro ?

## 2021-09-24 NOTE — Assessment & Plan Note (Signed)
Patient is morbidly obese and has a BMI of 46.37 ?Complicates overall prognosis and care ?Lifestyle modification and exercise has been discussed with patient in detail ?

## 2021-09-24 NOTE — ED Notes (Signed)
Pt was noted to have SI when he first came in. This RN went into pts room and spoke with pt. Pt states that he does not actively have SI. Pt states that he does not want to harm himself, he is not having thoughts of harming himself, and he does not have a plan to harm himself. Pt states that he does feel hopeless.  ?

## 2021-09-24 NOTE — H&P (Addendum)
History and Physical    Patient: Daniel [name] ZOX:[id number] DOB: [birth year] DOA: 09/24/2021 DOS: the patient was seen and examined on 09/24/2021 PCP: Anselm Jungling, NP  Patient coming from: Home  Chief Complaint:  Chief Complaint  Patient presents with   Shortness of Breath   HPI: Daniel [name] is a 31 y.o. male with medical history significant for morbid obesity (BMI 46), chronic respiratory failure on 4 L of oxygen, diabetes mellitus, A-fib a flutter, schizoaffective disorder who presents to the ER from the group home where he resides for evaluation of shortness of breath. When he arrived to ER he had room air pulse oximetry of 92% and was diaphoretic and was placed on his normal 4 L with improvement in his pulse oximetry to 98%.  He complains of being very anxious. He complains of shortness of breath associated with a nonproductive cough but denies having any fever or chills.  He denies feeling dizzy or lightheaded.  He reports a sharp pain in the center of his chest. He denies having any nausea, no vomiting, no headache, no changes in his bowel habits, no urinary symptoms, no blurred vision or any focal deficit. Review of Systems: As mentioned in the history of present illness. All other systems reviewed and are negative. Past Medical History:  Diagnosis Date   Atrial fibrillation (HCC)    Cardiomyopathy (HCC)    a. 07/2021 Echo: EF 50%, mild LVH, nl RV size/function. No significant valvular disease.   Chest pain    Conversion disorder    Current use of long term anticoagulation    Diabetes mellitus without complication (HCC)    Hyperlipidemia    Hypertension    OSA (obstructive sleep apnea)    Persistent atrial fibrillation and flutter (HCC)    a. CHA2DS2VASc = 3-4   Schizoaffective disorder (HCC)    Seizure disorder (HCC)    TIA (transient ischemic attack)    Urethral stricture    a. 08/2021 s/p cystoscopy and urethral dilation.   Past Surgical History:  Procedure  Laterality Date   CYSTOSCOPY WITH URETHRAL DILATATION N/A 09/07/2021   Procedure: CYSTOSCOPY WITH URETHRAL DILATATION CATHETER PLACEMENT;  Surgeon: Riki Altes, MD;  Location: [facility] ORS;  Service: Urology;  Laterality: N/A;   Social History:  reports that he has quit smoking. His smoking use included cigarettes. He has never used smokeless tobacco. He reports that he does not currently use alcohol. He reports that he does not use drugs.  Allergies  Allergen Reactions   Haldol [Haloperidol] Other (See Comments)    SI   Abilify [Aripiprazole] Palpitations   Demerol [Meperidine Hcl] Hives    No family history on file.  Prior to Admission medications   Medication Sig Start Date End Date Taking? Authorizing Provider  albuterol (VENTOLIN HFA) 108 (90 Base) MCG/ACT inhaler Inhale 2 puffs into the lungs every 6 (six) hours as needed for wheezing. 07/12/21   [provider]  allopurinol (ZYLOPRIM) 300 MG tablet Take 1 tablet (300 mg total) by mouth daily. 08/29/21   Alford Highland, MD  atorvastatin (LIPITOR) 80 MG tablet Take 80 mg by mouth daily. 05/11/21   [provider]  baclofen (LIORESAL) 10 MG tablet Take 1 tablet (10 mg total) by mouth 2 (two) times daily as needed for muscle spasms. Home med. 08/16/21   Darlin Priestly, MD  cephALEXin (KEFLEX) 500 MG capsule Take 1 capsule (500 mg total) by mouth 2 (two) times daily. 09/16/21   Ward, Layla Maw, DO  diltiazem (  CARDIZEM CD) 360 MG 24 hr capsule Take 1 capsule (360 mg total) by mouth daily. 09/10/21   Arnetha Courser, MD  divalproex (DEPAKOTE) 500 MG DR tablet Take 1,500 mg by mouth 2 (two) times daily. 1000 mg every morning and 1500 mg at bedtime 07/21/21   [provider]  ELIQUIS 5 MG TABS tablet Take 5 mg by mouth 2 (two) times daily. 06/16/21   [provider]  escitalopram (LEXAPRO) 10 MG tablet Take 20 mg by mouth daily. 06/16/21   [provider]  fluticasone (FLONASE) 50 MCG/ACT nasal spray Place 2  sprays into both nostrils 2 (two) times daily. 06/16/21   [provider]  furosemide (LASIX) 20 MG tablet Take 1 tablet (20 mg total) by mouth daily. 09/19/21 10/19/21  Georga Hacking, MD  HYDROcodone-acetaminophen (NORCO/VICODIN) 5-325 MG tablet Take 1 tablet by mouth every 4 (four) hours as needed. 09/16/21   Ward, Layla Maw, DO  ibuprofen (ADVIL) 800 MG tablet Take 1 tablet (800 mg total) by mouth every 8 (eight) hours as needed. 09/09/21   Arnetha Courser, MD  INVEGA 9 MG 24 hr tablet Take 9 mg by mouth every morning. 07/21/21   [provider]  loratadine (CLARITIN) 10 MG tablet Take 10 mg by mouth daily. 06/16/21   [provider]  melatonin 3 MG TABS tablet Take 3 mg by mouth at bedtime. 06/08/21   [provider]  metoprolol tartrate (LOPRESSOR) 50 MG tablet Take 1 tablet (50 mg total) by mouth 2 (two) times daily. 09/09/21   Arnetha Courser, MD  omeprazole (PRILOSEC) 20 MG capsule Take 20 mg by mouth daily.    [provider]  ondansetron (ZOFRAN-ODT) 4 MG disintegrating tablet Take 1 tablet (4 mg total) by mouth every 6 (six) hours as needed for nausea or vomiting. 09/16/21   Ward, Layla Maw, DO  oxybutynin (DITROPAN) 5 MG tablet Take 1 tablet (5 mg total) by mouth 2 (two) times daily. 09/12/21   Sharyn Creamer, MD  phenazopyridine (PYRIDIUM) 100 MG tablet Take 1 tablet (100 mg total) by mouth 3 (three) times daily with meals. 09/09/21   Arnetha Courser, MD  tamsulosin (FLOMAX) 0.4 MG CAPS capsule Take 1 capsule (0.4 mg total) by mouth daily. 09/10/21   Arnetha Courser, MD    Physical Exam: Vitals:   09/24/21 1122 09/24/21 1125 09/24/21 1510  BP: 103/69  111/67  Pulse: 98  88  Resp: (!) 23  (!) 22  Temp: 99 F (37.2 C)  98.3 F (36.8 C)  TempSrc: Oral  Oral  SpO2: 94%  100%  Weight:  (!) 177.4 kg   Height:  6\' 5"  (1.956 m)    Physical Exam Vitals and nursing note reviewed.  Constitutional:      Comments: Lethargic but arouses to verbal stimuli   HENT:     Head: Normocephalic and atraumatic.     Mouth/Throat:     Mouth: Mucous membranes are moist.  Eyes:     Pupils: Pupils are equal, round, and reactive to light.  Cardiovascular:     Rate and Rhythm: Normal rate and regular rhythm.  Pulmonary:     Effort: Pulmonary effort is normal.     Breath sounds: Normal breath sounds.  Abdominal:     General: Bowel sounds are normal.     Palpations: Abdomen is soft.  Musculoskeletal:     Cervical back: Normal range of motion and neck supple.     Right lower leg: Edema present.  Left lower leg: Edema present.  Skin:    General: Skin is warm and dry.  Neurological:     General: No focal deficit present.  Psychiatric:        Mood and Affect: Mood is anxious.        Behavior: Behavior normal.    Data Reviewed: Relevant notes from primary care and specialist visits, past discharge summaries as available in EHR, including Care Everywhere. Prior diagnostic testing as pertinent to current admission diagnoses Updated medications and problem lists for reconciliation ED course, including vitals, labs, imaging, treatment and response to treatment Triage notes, nursing and pharmacy notes and ED provider's notes Notable results as noted in HPI Labs reviewed.  Total protein 6.5, albumin 3.5, AST 13, ALT 12, alkaline phosphatase 45, troponin 3, ammonia 94, magnesium 2.0, TSH 3.27, sodium 139, potassium 4.1, chloride 95, bicarb 39, glucose 108, BUN 14, creatinine 1.10, calcium 8.8, valproic acid level 119, white count 6.3, hemoglobin 13.5, RDW 14.6, platelet count 181 Chest x-ray reviewed by me shows clear lungs Twelve-lead EKG reviewed by me shows A-fib with rapid ventricular rate There are no new results to review at this time.  Assessment and Plan: Generalized anxiety disorder Patient with a history of anxiety disorder who states that his Atarax was discontinued by his primary care provider We will consult behavioral health for  recommendations for anxiolytics  Hyperammonemia (HCC) Secondary to valproic acid use We will place patient on lactulose twice daily  Valproic acid toxicity Patient noted to have elevated valproic acid level at 119 and elevated ammonia levels as well. He is slightly lethargic but arouses easily Hold Depakote Monitor patient closely Per poison control patient should only receive carnitine if ammonia level is greater than 100. We will trend valproic acid levels as well as ammonia levels every 8 hours  Diabetes mellitus type 2, uncomplicated (HCC) Maintain consistent carbohydrate diet Glycemic control with sliding scale insulin  Chronic atrial fibrillation with RVR (HCC) Patient with a known history of A-fib currently in a rapid ventricular rate Continue metoprolol and Cardizem and if not controlled we will start patient on a Cardizem drip Continue Eliquis as primary prophylaxis for an acute stroke  Schizoaffective disorder, bipolar type (HCC) Continue Invega and Lexapro  Obesity, Class III, BMI 40-49.9 (morbid obesity) (HCC) Patient is morbidly obese and has a BMI of 46.37 Complicates overall prognosis and care Lifestyle modification and exercise has been discussed with patient in detail  Chronic hypoxemic respiratory failure (HCC) Stable and is most likely related to obesity hypoventilation syndrome Patient is on 4 L of oxygen continuous Maintain pulse oximetry greater than 92%      Advance Care Planning:   Code Status: Full Code   Consults: Behavioral health  Family Communication: Greater than 50% of time was spent discussing patient's condition and plan of care with him at the bedside.  All questions and concerns have been addressed.  He verbalizes understanding and agrees with the plan.  Severity of Illness: The appropriate patient status for this patient is INPATIENT. Inpatient status is judged to be reasonable and necessary in order to provide the required intensity  of service to ensure the patient's safety. The patient's presenting symptoms, physical exam findings, and initial radiographic and laboratory data in the context of their chronic comorbidities is felt to place them at high risk for further clinical deterioration. Furthermore, it is not anticipated that the patient will be medically stable for discharge from the hospital within 2 midnights of admission.   *  I certify that at the point of admission it is my clinical judgment that the patient will require inpatient hospital care spanning beyond 2 midnights from the point of admission due to high intensity of service, high risk for further deterioration and high frequency of surveillance required.*  Author: Lucile Shutters, MD 09/24/2021 4:48 PM  For on call review [email or web address].

## 2021-09-24 NOTE — Assessment & Plan Note (Signed)
Stable and is most likely related to obesity hypoventilation syndrome ?Patient is on 4 L of oxygen continuous ?Maintain pulse oximetry greater than 92% ?

## 2021-09-24 NOTE — ED Notes (Addendum)
Spoke with Verlon Au from poison control related to elevated valproic level, per poison control recommends trending valproic and ammonia levels every 4-6 hours. Recommendations if ammonia levels are above 100 to treat with L-carnitine and if below 100 no treatment required. ?

## 2021-09-24 NOTE — Assessment & Plan Note (Signed)
Patient with a history of anxiety disorder who states that his Atarax was discontinued by his primary care provider ?We will consult behavioral health for recommendations for anxiolytics ?

## 2021-09-24 NOTE — Assessment & Plan Note (Signed)
Maintain consistent carbohydrate diet Glycemic control with sliding scale insulin 

## 2021-09-25 DIAGNOSIS — G9341 Metabolic encephalopathy: Secondary | ICD-10-CM | POA: Diagnosis not present

## 2021-09-25 LAB — BASIC METABOLIC PANEL
Anion gap: 7 (ref 5–15)
BUN: 18 mg/dL (ref 6–20)
CO2: 41 mmol/L — ABNORMAL HIGH (ref 22–32)
Calcium: 8.9 mg/dL (ref 8.9–10.3)
Chloride: 89 mmol/L — ABNORMAL LOW (ref 98–111)
Creatinine, Ser: 0.92 mg/dL (ref 0.61–1.24)
GFR, Estimated: >60 mL/min (ref >60)
Glucose, Bld: 127 mg/dL — ABNORMAL HIGH (ref 70–99)
Potassium: 3.9 mmol/L (ref 3.5–5.1)
Sodium: 137 mmol/L (ref 135–145)

## 2021-09-25 LAB — CBC
HCT: 41.3 % (ref 39.0–52.0)
Hemoglobin: 13.1 g/dL (ref 13.0–17.0)
MCH: 28.9 pg (ref 26.0–34.0)
MCHC: 31.7 g/dL (ref 30.0–36.0)
MCV: 91.2 fL (ref 80.0–100.0)
Platelets: 154 10*3/uL (ref 150–400)
RBC: 4.53 MIL/uL (ref 4.22–5.81)
RDW: 14.6 % (ref 11.5–15.5)
WBC: 5.3 10*3/uL (ref 4.0–10.5)
nRBC: 0 % (ref 0.0–0.2)

## 2021-09-25 LAB — CBG MONITORING, ED
Glucose-Capillary: 130 mg/dL — ABNORMAL HIGH (ref 70–99)
Glucose-Capillary: 126 mg/dL — ABNORMAL HIGH (ref 70–99)

## 2021-09-25 LAB — VALPROIC ACID LEVEL
Valproic Acid Lvl: 73 ug/mL (ref 50.0–100.0)
Valproic Acid Lvl: 66 ug/mL (ref 50.0–100.0)

## 2021-09-25 LAB — AMMONIA
Ammonia: 47 umol/L — ABNORMAL HIGH (ref 9–35)
Ammonia: 42 umol/L — ABNORMAL HIGH (ref 9–35)

## 2021-09-25 MED ORDER — METOPROLOL TARTRATE 100 MG PO TABS: 100.0000 mg | ORAL_TABLET | Freq: Two times a day (BID) | ORAL | 1 refills | Status: DC

## 2021-09-25 MED ORDER — TAMSULOSIN HCL 0.4 MG PO CAPS
0.4000 mg | ORAL_CAPSULE | Freq: Every day | ORAL | Status: DC
  Administered 2021-09-25: 0.4 mg via ORAL
  Filled 2021-09-25: qty 1

## 2021-09-25 MED ORDER — FLUTICASONE PROPIONATE 50 MCG/ACT NA SUSP: 2.0000 | Freq: Two times a day (BID) | NASAL | Status: DC | PRN

## 2021-09-25 MED ORDER — DIVALPROEX SODIUM 500 MG PO DR TAB: 1000.0000 mg | DELAYED_RELEASE_TABLET | Freq: Two times a day (BID) | ORAL | Status: DC

## 2021-09-25 MED ORDER — DILTIAZEM HCL ER COATED BEADS 180 MG PO CP24
360.0000 mg | ORAL_CAPSULE | Freq: Every day | ORAL | Status: DC
  Administered 2021-09-25: 360 mg via ORAL
  Filled 2021-09-25: qty 2

## 2021-09-25 NOTE — ED Notes (Signed)
Patient provided with snacks per request

## 2021-09-25 NOTE — ED Notes (Signed)
Sent med message for missing pyridium. ?

## 2021-09-25 NOTE — ED Notes (Signed)
Pt ambulated to and from hall bathroom with steady gait. Pending discharge.  ?

## 2021-09-25 NOTE — ED Notes (Signed)
Pt states that promofit came off. New urinal provided to pt. ?

## 2021-09-25 NOTE — ED Notes (Signed)
Pt sleeping with lights down. ?

## 2021-09-25 NOTE — ED Notes (Signed)
Hospitalist at bedside 

## 2021-09-25 NOTE — ED Notes (Signed)
OJ from group home [phone] called to ask if pt is being discharged. He is 20 minutes away. Will being pt outside once he is here. He will call this RN to notify when he arrives. ?

## 2021-09-25 NOTE — TOC Progression Note (Signed)
Transition of Care (TOC) - Progression Note  ? ? ?Patient Details  ?Name: Daniel [name] ?MRN: [id number] ?Date of Birth: 04/25/1991 ? ?Transition of Care (TOC) CM/SW Contact  ?Jetson Pickrel L Tayleigh Wetherell, LCSWA ?Phone Number: ?09/25/2021, 1:55 PM ? ?Clinical Narrative:    ? ?Provider informed CSW that patient is ready to discharge. CSW called Scotty Court [phone]) from group home and let him know. Scotty Court acknowledged understanding and reported they will come pick the patient up.  ? ?No further TOC needs at this time.  ? ?  ?  ? ?Expected Discharge Plan and Services ?  ?  ?  ?  ?  ?Expected Discharge Date: 09/25/21               ?  ?  ?  ?  ?  ?  ?  ?  ?  ?  ? ? ?Social Determinants of Health (SDOH) Interventions ?  ? ?Readmission Risk Interventions ? ?  09/06/2021  ?  9:33 AM  ?Readmission Risk Prevention Plan  ?Transportation Screening Complete  ?PCP or Specialist Appt within 3-5 Days Complete  ?HRI or Home Care Consult Complete  ?Social Work Consult for Recovery Care Planning/Counseling Not Complete  ?SW consult not completed comments RNCM assigned to case  ?Palliative Care Screening Not Applicable  ?Medication Review Oceanographer) Complete  ? ? ?

## 2021-09-25 NOTE — ED Notes (Signed)
Patient reports increased HR with urination.  C/o pain and burning.  HR noted to be 124 and decreased to 104 after patient was finished urinating ?

## 2021-09-25 NOTE — ED Notes (Signed)
Pt getting dressed to discharge home. ?

## 2021-09-25 NOTE — TOC Initial Note (Signed)
Transition of Care (TOC) - Initial/Assessment Note  ? ? ?Patient Details  ?Name: Daniel [name] ?MRN: [id number] ?Date of Birth: [birth year] ? ?Transition of Care (TOC) CM/SW Contact:    ?Severa Jeremiah L Dmari Schubring, LCSWA ?Phone Number: ?09/25/2021, 9:43 AM ? ?Clinical Narrative:                 ? ?TOC consult to assist with getting patient back to group home. Provider informed CSW plan to discharge patient today. CSW spoke to patient's legal guardian Courtney Paris. [phone] to receive point of contact in group home.  ? ?[facility] group home ?Owner: Wynona Meals [phone]) ?Staff: Scotty Court [phone]) ? ?CSW spoke to group home owner Jonny Ruiz and confirmed that the group home will provide transportation upon discharge. CSW let him know provider's plan to discharge today but exact time is unknown. John said the hospital staff can call Scotty Court (on call group home staff) when the patient is ready to discharge.  ?  ?  ? ? ?Patient Goals and CMS Choice ?  ?  ?  ? ?Expected Discharge Plan and Services ?  ?  ?  ?  ?  ?                ?  ?  ?  ?  ?  ?  ?  ?  ?  ?  ? ?Prior Living Arrangements/Services ?  ?  ?  ?       ?  ?  ?  ?  ? ?Activities of Daily Living ?  ?  ? ?Permission Sought/Granted ?  ?  ?   ?   ?   ?   ? ?Emotional Assessment ?  ?  ?  ?  ?  ?  ? ?Admission diagnosis:  Acute metabolic encephalopathy [G93.41] ?Patient Active Problem List  ? Diagnosis Date Noted  ? Acute metabolic encephalopathy 09/24/2021  ? Valproic acid toxicity 09/24/2021  ? Hyperammonemia (HCC) 09/24/2021  ? Generalized anxiety disorder 09/24/2021  ? Hyperkalemia 09/08/2021  ? Chronic atrial fibrillation with RVR (HCC)   ? Urinary obstruction 09/05/2021  ? Inability to urinate   ? Conversion disorder   ? Syncope and collapse 08/28/2021  ? History of gout 08/28/2021  ? Schizoaffective disorder, bipolar type (HCC) 08/14/2021  ? Chronic hypoxemic respiratory failure (HCC) 08/14/2021  ? OSA (obstructive sleep apnea)   ? Diabetes mellitus type 2,  uncomplicated (HCC)   ? Left-sided weakness 08/02/2021  ? Obesity, Class III, BMI 40-49.9 (morbid obesity) (HCC) 08/02/2021  ? Chronic atrial fibrillation (HCC) 08/02/2021  ? Primary hypertension 08/04/2020  ? ?PCP:  Anselm Jungling, NP ?Pharmacy:   ?[facility] - [place], [place] - [address] ?[address] ?[place] [address] ?Phone: [phone] Fax: [phone] ? ? ? ? ?Social Determinants of Health (SDOH) Interventions ?  ? ?Readmission Risk Interventions ? ?  09/06/2021  ?  9:33 AM  ?Readmission Risk Prevention Plan  ?Transportation Screening Complete  ?PCP or Specialist Appt within 3-5 Days Complete  ?HRI or Home Care Consult Complete  ?Social Work Consult for Recovery Care Planning/Counseling Not Complete  ?SW consult not completed comments RNCM assigned to case  ?Palliative Care Screening Not Applicable  ?Medication Review Oceanographer) Complete  ? ? ? ?

## 2021-09-25 NOTE — ED Notes (Signed)
Poison Control called to re-evaluate patient and current status.  Per RN, valproic acid level is down trending, vital signs are stable, and this was not a suicide attempt, they are no longer concerned and will close case.  ?

## 2021-09-25 NOTE — ED Notes (Signed)
Pt was eating breakfast. No dizziness or CP noted, but HR was up to 150, a fib RVR. Hospitalist notified. No PRN orders available at this time. ?

## 2021-09-25 NOTE — ED Notes (Signed)
Pt urinated all over bed and floor. Bed and floor changed, gown changed, peri care performed by pt. Promofit placed.  ?

## 2021-09-25 NOTE — ED Notes (Signed)
Called legal guardian Daniel [name] to update that pt will be discharged. Spoke with her on phone at [phone].  ?

## 2021-09-25 NOTE — Discharge Summary (Signed)
Daniel [name] JQG:[id number] DOB: February 14, 1991 DOA: 09/24/2021 ? ?PCP: Anselm Jungling, NP ? ?Admit date: 09/24/2021 ?Discharge date: 09/25/2021 ? ?Time spent: 45 minutes ? ?Recommendations for Outpatient Follow-up:  ?Psychiatry and cardiology f/u ?Depakote level 1-2 weeks  ? ? ? ?Discharge Diagnoses:  ?Principal Problem: ?  Acute metabolic encephalopathy ?Active Problems: ?  Generalized anxiety disorder ?  Valproic acid toxicity ?  Hyperammonemia (HCC) ?  Diabetes mellitus type 2, uncomplicated (HCC) ?  Chronic atrial fibrillation with RVR (HCC) ?  Obesity, Class III, BMI 40-49.9 (morbid obesity) (HCC) ?  Schizoaffective disorder, bipolar type (HCC) ?  Chronic hypoxemic respiratory failure (HCC) ? ? ?Discharge Condition: stable ? ?Diet recommendation: heart healthy ? ?Filed Weights  ? 09/24/21 1125  ?Weight: (!) 177.4 kg  ? ? ?History of present illness:  ?From admission h and p ?Monterrio Gerst is a 31 y.o. male with medical history significant for morbid obesity (BMI 46), chronic respiratory failure on 4 L of oxygen, diabetes mellitus, A-fib a flutter, schizoaffective disorder who presents to the ER from the group home where he resides for evaluation of shortness of breath. ?When he arrived to ER he had room air pulse oximetry of 92% and was diaphoretic and was placed on his normal 4 L with improvement in his pulse oximetry to 98%.  He complains of being very anxious. ?He complains of shortness of breath associated with a nonproductive cough but denies having any fever or chills.  He denies feeling dizzy or lightheaded.  He reports a sharp pain in the center of his chest. ?He denies having any nausea, no vomiting, no headache, no changes in his bowel habits, no urinary symptoms, no blurred vision or any focal deficit. ? ?Hospital Course:  ?Patient presented with a fib with rvr. Also found to have slightly supratherpeutic valproic acid level. Hemodynamically stable. Home meds (metoprolol and diltiazem) were continued  and rvr resolved, hr improved to 80-90s at time of discharge and patient ambulated without incident. He was stable on his home 4 L of O2. Psychiatry evaluated and advised continuing home meds though at reduced dose depakote. Not clear if patient taking metop tartrate 50 or 100 bid but will advise 100 bid. Will need close outpatient f/u with cardiology and psychiatry.  ? ?Procedures: ?none  ? ?Consultations: ?psychiatry ? ?Discharge Exam: ?Vitals:  ? 09/25/21 1215 09/25/21 1241  ?BP:  114/78  ?Pulse: 93 100  ?Resp:  19  ?Temp:    ?SpO2: 92% 96%  ? ? ?General: obese, NAD ?Cardiovascular: irreg irreg ?Respiratory: CTAB ?Abdomen: non-tender ? ?Discharge Instructions ? ? ?Discharge Instructions   ? ? Diet - low sodium heart healthy   Complete by: As directed ?  ? Increase activity slowly   Complete by: As directed ?  ? ?  ? ?Allergies as of 09/25/2021   ? ?   Reactions  ? Haldol [haloperidol] Other (See Comments)  ? SI  ? Abilify [aripiprazole] Palpitations  ? Demerol [meperidine Hcl] Hives  ? ?  ? ?  ?Medication List  ?  ? ?STOP taking these medications   ? ?cephALEXin 500 MG capsule ?Commonly known as: KEFLEX ?  ?ibuprofen 800 MG tablet ?Commonly known as: ADVIL ?  ? ?  ? ?TAKE these medications   ? ?albuterol 108 (90 Base) MCG/ACT inhaler ?Commonly known as: VENTOLIN HFA ?Inhale 2 puffs into the lungs every 6 (six) hours as needed for wheezing. ?  ?allopurinol 300 MG tablet ?Commonly known as: ZYLOPRIM ?Take 1 tablet (300 mg  total) by mouth daily. ?  ?atorvastatin 80 MG tablet ?Commonly known as: LIPITOR ?Take 80 mg by mouth daily. ?  ?baclofen 10 MG tablet ?Commonly known as: LIORESAL ?Take 1 tablet (10 mg total) by mouth 2 (two) times daily as needed for muscle spasms. Home med. ?  ?diltiazem 360 MG 24 hr capsule ?Commonly known as: CARDIZEM CD ?Take 1 capsule (360 mg total) by mouth daily. ?  ?divalproex 500 MG DR tablet ?Commonly known as: DEPAKOTE ?Take 2 tablets (1,000 mg total) by mouth 2 (two) times  daily. ?What changed:  ?how much to take ?additional instructions ?  ?Eliquis 5 MG Tabs tablet ?Generic drug: apixaban ?Take 5 mg by mouth 2 (two) times daily. ?  ?escitalopram 10 MG tablet ?Commonly known as: LEXAPRO ?Take 20 mg by mouth daily. ?  ?fluticasone 50 MCG/ACT nasal spray ?Commonly known as: FLONASE ?Place 2 sprays into both nostrils 2 (two) times daily. ?  ?furosemide 20 MG tablet ?Commonly known as: Lasix ?Take 1 tablet (20 mg total) by mouth daily. ?  ?HYDROcodone-acetaminophen 5-325 MG tablet ?Commonly known as: NORCO/VICODIN ?Take 1 tablet by mouth every 4 (four) hours as needed. ?  ?Invega 9 MG 24 hr tablet ?Generic drug: paliperidone ?Take 9 mg by mouth every morning. ?  ?loratadine 10 MG tablet ?Commonly known as: CLARITIN ?Take 10 mg by mouth daily. ?  ?melatonin 3 MG Tabs tablet ?Take 3 mg by mouth at bedtime. ?  ?metoprolol tartrate 100 MG tablet ?Commonly known as: LOPRESSOR ?Take 1 tablet (100 mg total) by mouth 2 (two) times daily. ?What changed:  ?medication strength ?how much to take ?  ?omeprazole 20 MG capsule ?Commonly known as: PRILOSEC ?Take 20 mg by mouth daily. ?  ?ondansetron 4 MG disintegrating tablet ?Commonly known as: ZOFRAN-ODT ?Take 1 tablet (4 mg total) by mouth every 6 (six) hours as needed for nausea or vomiting. ?  ?oxybutynin 5 MG tablet ?Commonly known as: DITROPAN ?Take 1 tablet (5 mg total) by mouth 2 (two) times daily. ?  ?phenazopyridine 100 MG tablet ?Commonly known as: PYRIDIUM ?Take 1 tablet (100 mg total) by mouth 3 (three) times daily with meals. ?  ?tamsulosin 0.4 MG Caps capsule ?Commonly known as: FLOMAX ?Take 1 capsule (0.4 mg total) by mouth daily. ?  ? ?  ? ?Allergies  ?Allergen Reactions  ? Haldol [Haloperidol] Other (See Comments)  ?  SI  ? Abilify [Aripiprazole] Palpitations  ? Demerol [Meperidine Hcl] Hives  ? ? Follow-up Information   ? ? Yvonne KendallEnd, Christopher, MD Follow up.   ?Specialty: Cardiology ?Contact information: ?[address] ?[address] ?[place] [address] ?[phone] ? ? ?  ?  ? ? Your psychiatrist Follow up.   ? ?  ?  ? ?  ?  ? ?  ? ? ? ?The results of significant diagnostics from this hospitalization (including imaging, microbiology, ancillary and laboratory) are listed below for reference.   ? ?Significant Diagnostic Studies: ?DG Chest 2 View ? ?Result Date: 09/17/2021 ?CLINICAL DATA:  Weakness, trouble breathing EXAM: CHEST - 2 VIEW COMPARISON:  Chest radiograph 09/13/2021 CT chest dated 1 day prior FINDINGS: The heart is enlarged.  The upper mediastinal contours are normal. There is vascular congestion without definite overt pulmonary edema. There is no focal consolidation. There is no pleural effusion or pneumothorax. There is no acute osseous abnormality. IMPRESSION: Cardiomegaly with vascular congestion but no overt pulmonary edema. Otherwise, no radiographic evidence of acute cardiopulmonary process. Electronically Signed  By: Lesia Hausen M.D.   On: 09/17/2021 15:15  ? ?DG Chest 2 View ? ?Result Date: 09/13/2021 ?CLINICAL DATA:  Chest pain. EXAM: CHEST - 2 VIEW COMPARISON:  09/04/2021 and chest CT 08/02/2021 FINDINGS: Both lungs are clear. Heart and mediastinum are within normal limits. Trachea is midline. Again noted is straightening of the thoracic spine. No pleural effusions. Small nodular density in the left midlung is probably vascular in etiology based on previous chest CT. IMPRESSION: No active cardiopulmonary disease. Electronically Signed   By: Richarda Overlie M.D.   On: 09/13/2021 13:09  ? ?DG Chest 2 View ? ?Result Date: 09/04/2021 ?CLINICAL DATA:  31 year old male with history of syncope. EXAM: CHEST - 2 VIEW COMPARISON:  Chest x-ray 08/28/2021. FINDINGS: Lung volumes are normal. No consolidative airspace disease. No pleural effusions. No pneumothorax. No pulmonary nodule or mass noted. Pulmonary vasculature and the cardiomediastinal silhouette are within normal limits. IMPRESSION: No radiographic evidence of acute  cardiopulmonary disease. Electronically Signed   By: Trudie Reed M.D.   On: 09/04/2021 12:37  ? ?CT HEAD WO CONTRAST ( ) ? ?Result Date: 09/19/2021 ?CLINICAL DATA:  Mental status change, loss consciousness. EXAM: CT HEAD W

## 2021-09-25 NOTE — ED Notes (Signed)
Pt ate lunch tray and asking for snacks. Pt already had chocolate milk this AM. Gave water, sprite, graham crackers. ?

## 2021-09-25 NOTE — ED Notes (Signed)
Provided phone, water and graham crackers to pt. ? ?CSW spoke with group home who will come pick up pt.  ?

## 2021-09-29 ENCOUNTER — Emergency Department: Payer: Medicaid Other

## 2021-09-29 ENCOUNTER — Other Ambulatory Visit: Payer: Self-pay

## 2021-09-29 ENCOUNTER — Emergency Department
Admission: EM | Admit: 2021-09-29 | Discharge: 2021-09-30 | Payer: Medicaid Other | Attending: Emergency Medicine | Admitting: Emergency Medicine

## 2021-09-29 DIAGNOSIS — F250 Schizoaffective disorder, bipolar type: Secondary | ICD-10-CM | POA: Diagnosis not present

## 2021-09-29 DIAGNOSIS — F444 Conversion disorder with motor symptom or deficit: Secondary | ICD-10-CM | POA: Insufficient documentation

## 2021-09-29 DIAGNOSIS — E119 Type 2 diabetes mellitus without complications: Secondary | ICD-10-CM | POA: Diagnosis not present

## 2021-09-29 DIAGNOSIS — R531 Weakness: Secondary | ICD-10-CM

## 2021-09-29 DIAGNOSIS — I10 Essential (primary) hypertension: Secondary | ICD-10-CM | POA: Diagnosis not present

## 2021-09-29 DIAGNOSIS — I4891 Unspecified atrial fibrillation: Secondary | ICD-10-CM | POA: Diagnosis not present

## 2021-09-29 DIAGNOSIS — K625 Hemorrhage of anus and rectum: Secondary | ICD-10-CM | POA: Diagnosis not present

## 2021-09-29 DIAGNOSIS — F411 Generalized anxiety disorder: Secondary | ICD-10-CM | POA: Diagnosis present

## 2021-09-29 DIAGNOSIS — I482 Chronic atrial fibrillation, unspecified: Secondary | ICD-10-CM | POA: Diagnosis present

## 2021-09-29 DIAGNOSIS — F449 Dissociative and conversion disorder, unspecified: Secondary | ICD-10-CM | POA: Diagnosis present

## 2021-09-29 DIAGNOSIS — R45851 Suicidal ideations: Secondary | ICD-10-CM

## 2021-09-29 DIAGNOSIS — Z8739 Personal history of other diseases of the musculoskeletal system and connective tissue: Secondary | ICD-10-CM

## 2021-09-29 LAB — CBC WITH DIFFERENTIAL/PLATELET
Abs Immature Granulocytes: 0.05 10*3/uL (ref 0.00–0.07)
Basophils Absolute: 0 10*3/uL (ref 0.0–0.1)
Basophils Relative: 1 %
Eosinophils Absolute: 0.1 10*3/uL (ref 0.0–0.5)
Eosinophils Relative: 2 %
HCT: 41.7 % (ref 39.0–52.0)
Hemoglobin: 13.4 g/dL (ref 13.0–17.0)
Immature Granulocytes: 1 %
Lymphocytes Relative: 23 %
Lymphs Abs: 1.5 10*3/uL (ref 0.7–4.0)
MCH: 28.6 pg (ref 26.0–34.0)
MCHC: 32.1 g/dL (ref 30.0–36.0)
MCV: 88.9 fL (ref 80.0–100.0)
Monocytes Absolute: 0.9 10*3/uL (ref 0.1–1.0)
Monocytes Relative: 14 %
Neutro Abs: 3.8 10*3/uL (ref 1.7–7.7)
Neutrophils Relative %: 59 %
Platelets: 150 10*3/uL (ref 150–400)
RBC: 4.69 MIL/uL (ref 4.22–5.81)
RDW: 14.6 % (ref 11.5–15.5)
WBC: 6.4 10*3/uL (ref 4.0–10.5)
nRBC: 0 % (ref 0.0–0.2)

## 2021-09-29 LAB — COMPREHENSIVE METABOLIC PANEL
ALT: 18 U/L (ref 0–44)
AST: 18 U/L (ref 15–41)
Albumin: 3.9 g/dL (ref 3.5–5.0)
Alkaline Phosphatase: 58 U/L (ref 38–126)
Anion gap: 10 (ref 5–15)
BUN: 18 mg/dL (ref 6–20)
CO2: 32 mmol/L (ref 22–32)
Calcium: 9.2 mg/dL (ref 8.9–10.3)
Chloride: 96 mmol/L — ABNORMAL LOW (ref 98–111)
Creatinine, Ser: 1.04 mg/dL (ref 0.61–1.24)
GFR, Estimated: >60 mL/min (ref >60)
Glucose, Bld: 104 mg/dL — ABNORMAL HIGH (ref 70–99)
Potassium: 3.9 mmol/L (ref 3.5–5.1)
Sodium: 138 mmol/L (ref 135–145)
Total Bilirubin: 0.7 mg/dL (ref 0.3–1.2)
Total Protein: 7.4 g/dL (ref 6.5–8.1)

## 2021-09-29 LAB — TROPONIN I (HIGH SENSITIVITY)
Troponin I (High Sensitivity): 3 ng/L (ref <18)
Troponin I (High Sensitivity): 3 ng/L (ref <18)

## 2021-09-29 LAB — AMMONIA: Ammonia: 42 umol/L — ABNORMAL HIGH (ref 9–35)
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
## 2021-09-29 NOTE — ED Notes (Signed)
Writer attempted to contact all numbers listed for pts legal guardian but unable to reach. Voicemail's left on each with contact information to ED for guardian to return call.  ?
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
## 2021-09-29 NOTE — ED Triage Notes (Signed)
Pt also c/o bloody stools that started yesterday.  ?

## 2021-09-29 NOTE — ED Triage Notes (Signed)
First Nurse note:  Arrives via [facility].  From [facility] group [facility].  EMS called for c/o chest pain and feeling as if he was going to have a seizure.  EMS also reports blood in urine.  Also c/o left sided weakness x 1 day.  CBG:  124. ? ?VS wnl  Hx afib. ?

## 2021-09-30 ENCOUNTER — Other Ambulatory Visit: Payer: Self-pay

## 2021-09-30 NOTE — ED Notes (Signed)
VOL/pending discharge 

## 2021-09-30 NOTE — ED Notes (Signed)
Pt given breakfast tray

## 2021-09-30 NOTE — ED Notes (Signed)
Medically cleared, VOL, pending psych ?  ?Georga Hacking, MD ?09/30/21 0037 ? ?

## 2021-09-30 NOTE — ED Notes (Signed)
Hospital meal provided, pt tolerated w/o complaints.  Waste discarded appropriately.  

## 2021-09-30 NOTE — ED Notes (Signed)
VS ASSESS. NO OTHER NEEDS FOUND AT THIS MOMENT. ?

## 2021-09-30 NOTE — TOC Initial Note (Signed)
Transition of Care (TOC) - Initial/Assessment Note  ? ? ?Patient Details  ?Name: Daniel [name] ?MRN: [id number] ?Date of Birth: 1990/07/09 ? ?Transition of Care (TOC) CM/SW Contact:    ?Shelbie Hutching, RN ?Phone Number: ?09/30/2021, 11:22 AM ? ?Clinical Narrative:                 ?Patient came to the emergency room for chest pain and feeling like he was going to have a seizure.  Patient is from [facility].  Patient has a legal guardian through [place] cell is [phone].   ?Patient is medically and psychiatrically cleared for discharge.  Overnight the group home said they would not accept the patient back but they have not provided a 60 day notice of discharge.  RNCM spoke with OJ caregiver at the ALF he says that they will not take patient back they cannot deal with his behavior, asked if patient has ever acted like this before and they said no.  Group home owner is Dorna Leitz [phone]- RNCM has tried to call and voicemail left for return call.   ? ?[name] never said that she did not want to be contacted about patient, Barton Fanny has had guardianship of patient since 2015 and they are very involved with him.  [name] reports that the ALF new exactly what they were getting into accepting patient and that they are getting paid very well to provide care.   ? ?Patient does have a [employer] Case Manager Theodis Sato [phone].  ? ? ?Expected Discharge Plan: Group Home ?Barriers to Discharge: ED Facility/Family Refusing to Allow Patient to Return ? ? ?Patient Goals and CMS Choice ?  ?  ?  ? ?Expected Discharge Plan and Services ?Expected Discharge Plan: Group Home ?  ?Discharge Planning Services: CM Consult ?Post Acute Care Choice: NA ?Living arrangements for the past 2 months: [place] ?                ?DME Arranged: N/A ?DME Agency: NA ?  ?  ?  ?HH Arranged: NA ?[place] Agency: NA ?  ?  ?  ? ?Prior Living Arrangements/Services ?Living arrangements for  the past 2 months: [place] ?Lives with:: Facility Resident ?Patient language and need for interpreter reviewed:: Yes ?       ?Need for Family Participation in Patient Care: Yes (Comment) ?Care giver support system in place?: Yes (comment) (legal guardian) ?  ?Criminal Activity/Legal Involvement Pertinent to Current Situation/Hospitalization: No - Comment as needed ? ?Activities of Daily Living ?  ?  ? ?Permission Sought/Granted ?Permission sought to share information with : Guardian, Customer service manager ?Permission granted to share information with : Yes, Verbal Permission Granted ? Share Information with NAME: Ennis Forts ? Permission granted to share info w AGENCY: [facility] ? Permission granted to share info w Relationship: Legal guardian- [employer] ? Permission granted to share info w Contact Information: [phone] ? ?Emotional Assessment ?Appearance:: Appears stated age ?  ?  ?Orientation: : Oriented to Self, Oriented to Place, Oriented to Situation ?Alcohol / Substance Use: Not Applicable ?Psych Involvement: Yes (comment), Outpatient Provider ? ?Admission diagnosis:  Weakness ?Patient Active Problem List  ? Diagnosis Date Noted  ? Acute metabolic encephalopathy [id number]  ? Valproic acid toxicity 09/24/2021  ? Hyperammonemia ([place]) 09/24/2021  ? Generalized anxiety disorder 09/24/2021  ? Hyperkalemia 09/08/2021  ? Chronic atrial fibrillation with RVR (HCC)   ? Urinary obstruction 09/05/2021  ? Inability  to urinate   ? Conversion disorder   ? Syncope and collapse 08/28/2021  ? History of gout 08/28/2021  ? Schizoaffective disorder, bipolar type ([place]) 08/14/2021  ? Chronic hypoxemic respiratory failure ([place]) 08/14/2021  ? OSA (obstructive sleep apnea)   ? Diabetes mellitus type 2, uncomplicated ([place])   ? Left-sided weakness 08/02/2021  ? Obesity, Class III, BMI 40-49.9 (morbid obesity) ([place]) 08/02/2021  ? Chronic atrial fibrillation ([place])  08/02/2021  ? Primary hypertension 08/04/2020  ? ?PCP:  Lauretta Grill, NP ?Pharmacy:   ?[place] [address] ?[place] [address] ?Phone: [phone] Fax: [phone] ? ? ? ? ?Social Determinants of Health (SDOH) Interventions ?  ? ?Readmission Risk Interventions ? ?  09/06/2021  ?  9:33 AM  ?Readmission Risk Prevention Plan  ?Transportation Screening Complete  ?PCP or Specialist Appt within 3-5 Days Complete  ?[place] or Home Care Consult Complete  ?Social Work Consult for [place] Planning/Counseling Not Complete  ?SW consult not completed comments RNCM assigned to case  ?Palliative Care Screening Not Applicable  ?Medication Review Press photographer) Complete  ? ? ? ?
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
## 2021-09-30 NOTE — ED Notes (Signed)
Pt given graham crackers, peanut butter, and chocolate milk ?

## 2021-09-30 NOTE — ED Notes (Signed)
Pt dressed out in burgundy scrubs. ? ?Belongings placed in belongings bag: ?Dark green jacket ?Blue jeans ?White t shirt ?Light blue boxers  ?Blue/black slides ?

## 2021-10-03 ENCOUNTER — Other Ambulatory Visit: Payer: Self-pay

## 2021-10-03 ENCOUNTER — Emergency Department
Admission: EM | Admit: 2021-10-03 | Discharge: 2021-10-03 | Disposition: A | Discharge status: Nursing Home (Includes ICF) | Payer: Medicaid Other | Attending: Emergency Medicine | Admitting: Emergency Medicine

## 2021-10-03 ENCOUNTER — Emergency Department: Payer: Medicaid Other

## 2021-10-03 ENCOUNTER — Encounter: Payer: Self-pay | Admitting: Emergency Medicine

## 2021-10-03 DIAGNOSIS — R209 Unspecified disturbances of skin sensation: Secondary | ICD-10-CM | POA: Insufficient documentation

## 2021-10-03 DIAGNOSIS — E7220 Disorder of urea cycle metabolism, unspecified: Secondary | ICD-10-CM | POA: Diagnosis not present

## 2021-10-03 DIAGNOSIS — Z79899 Other long term (current) drug therapy: Secondary | ICD-10-CM

## 2021-10-03 DIAGNOSIS — R200 Anesthesia of skin: Secondary | ICD-10-CM

## 2021-10-03 DIAGNOSIS — E7143 Iatrogenic carnitine deficiency: Secondary | ICD-10-CM | POA: Insufficient documentation

## 2021-10-03 DIAGNOSIS — E119 Type 2 diabetes mellitus without complications: Secondary | ICD-10-CM | POA: Diagnosis not present

## 2021-10-03 DIAGNOSIS — R531 Weakness: Secondary | ICD-10-CM | POA: Diagnosis not present

## 2021-10-03 DIAGNOSIS — Z7901 Long term (current) use of anticoagulants: Secondary | ICD-10-CM | POA: Insufficient documentation

## 2021-10-03 DIAGNOSIS — I482 Chronic atrial fibrillation, unspecified: Secondary | ICD-10-CM | POA: Diagnosis not present

## 2021-10-03 LAB — BASIC METABOLIC PANEL
Anion gap: 9 (ref 5–15)
BUN: 17 mg/dL (ref 6–20)
CO2: 31 mmol/L (ref 22–32)
Calcium: 9 mg/dL (ref 8.9–10.3)
Chloride: 96 mmol/L — ABNORMAL LOW (ref 98–111)
Creatinine, Ser: 1.07 mg/dL (ref 0.61–1.24)
GFR, Estimated: >60 mL/min (ref >60)
Glucose, Bld: 109 mg/dL — ABNORMAL HIGH (ref 70–99)
Potassium: 4.7 mmol/L (ref 3.5–5.1)
Sodium: 136 mmol/L (ref 135–145)

## 2021-10-03 LAB — CBC WITH DIFFERENTIAL/PLATELET
Abs Immature Granulocytes: 0.06 10*3/uL (ref 0.00–0.07)
Basophils Absolute: 0 10*3/uL (ref 0.0–0.1)
Basophils Relative: 1 %
Eosinophils Absolute: 0.1 10*3/uL (ref 0.0–0.5)
Eosinophils Relative: 2 %
HCT: 42.5 % (ref 39.0–52.0)
Hemoglobin: 13.7 g/dL (ref 13.0–17.0)
Immature Granulocytes: 1 %
Lymphocytes Relative: 32 %
Lymphs Abs: 1.7 10*3/uL (ref 0.7–4.0)
MCH: 28.6 pg (ref 26.0–34.0)
MCHC: 32.2 g/dL (ref 30.0–36.0)
MCV: 88.7 fL (ref 80.0–100.0)
Monocytes Absolute: 0.7 10*3/uL (ref 0.1–1.0)
Monocytes Relative: 14 %
Neutro Abs: 2.7 10*3/uL (ref 1.7–7.7)
Neutrophils Relative %: 50 %
Platelets: 204 10*3/uL (ref 150–400)
RBC: 4.79 MIL/uL (ref 4.22–5.81)
RDW: 13.9 % (ref 11.5–15.5)
WBC: 5.3 10*3/uL (ref 4.0–10.5)
nRBC: 0.4 % — ABNORMAL HIGH (ref 0.0–0.2)

## 2021-10-03 LAB — HEPATIC FUNCTION PANEL
ALT: 15 U/L (ref 0–44)
AST: 16 U/L (ref 15–41)
Albumin: 3.6 g/dL (ref 3.5–5.0)
Alkaline Phosphatase: 52 U/L (ref 38–126)
Bilirubin, Direct: <0.1 mg/dL (ref 0.0–0.2)
Total Bilirubin: 0.6 mg/dL (ref 0.3–1.2)
Total Protein: 6.9 g/dL (ref 6.5–8.1)

## 2021-10-03 LAB — VALPROIC ACID LEVEL
Valproic Acid Lvl: 125 ug/mL — ABNORMAL HIGH (ref 50.0–100.0)
Valproic Acid Lvl: 104 ug/mL — ABNORMAL HIGH (ref 50.0–100.0)

## 2021-10-03 LAB — CBG MONITORING, ED: Glucose-Capillary: 133 mg/dL — ABNORMAL HIGH (ref 70–99)

## 2021-10-03 LAB — AMMONIA: Ammonia: 61 umol/L — ABNORMAL HIGH (ref 9–35)

## 2021-10-03 MED ORDER — LACTULOSE 10 GM/15ML PO SOLN
20.0000 g | Freq: Once | ORAL | Status: AC
  Administered 2021-10-03: 20 g via ORAL
  Filled 2021-10-03: qty 30

## 2021-10-03 NOTE — ED Notes (Signed)
This RN to bedside due to patient repeatedly calling out, pt states "I don't feel good", and "you might want to call housekeeping". Pt noted to have had large amounts of emesis on floor beside bed. Explained will notify EDP of patient's emesis.  ?

## 2021-10-03 NOTE — ED Notes (Signed)
Pt placed on his chronic 4l [place].  ?

## 2021-10-03 NOTE — ED Notes (Signed)
Neurologist at bedside. No code stroke. No need to room immediately. Appropriate orders placed. Labs drawn. Pt taken to xray/CT. Pt continues to complain of multiple issues such as numbness, tingling, weakness. Last known well continues to change. Talks about his shoulder hurting as well. MD and Neuro aware and have evaluated pt.  ?

## 2021-10-03 NOTE — ED Notes (Addendum)
This RN attempted to call Kevin Fenton (caregiver) [phone], no answer, this RN spoke with OJ Manson Passey (caregiver) from patient's group home [phone], states he is on the way to pick patient up and will bring portable oxygen tank for transport home. EDP aware.  ?

## 2021-10-03 NOTE — ED Provider Notes (Signed)
? ? [facility] ?Provider Note ? ? ? Event Date/Time  ? First MD Initiated Contact with Patient 10/03/21 1525   ?  (approximate) ? ? ?History  ? ?Chief Complaint ?Numbness ? ? ?HPI ? ?Daniel [name] is a 31 y.o. male with past medical history of diabetes, chronic atrial fibrillation on Eliquis, OSA, chronic respiratory failure on 4 L nasal cannula, schizoaffective disorder, conversion disorder, and generalized anxiety disorder who presents to the ED complaining of numbness and weakness.  Patient reports that sometime this morning he began to notice numbness and weakness in his left arm and left leg, but he is unable to specify a certain time.  He states there is tingling and pain in the left arm and that he is able to walk, but does so with a limp.  He complains of "pain all over" along with a tingling feeling through much of his body.  He denies any fevers, cough, chest pain, abdominal pain, nausea, vomiting, or shortness of breath beyond his chronic breathing difficulties.  Patient has previously been treated for elevated Depakote level as well as elevated ammonia, is concerned the same thing is happening today.  He reports continuing to take 1500 mg of Depakote twice daily. ? ? ?Physical Exam  ? ?Triage Vital Signs: ?ED Triage Vitals  ?Enc Vitals Group  ?   BP 10/03/21 1503 95/65  ?   Pulse Rate 10/03/21 1503 86  ?   Resp 10/03/21 1503 20  ?   Temp --   ?   Temp src --   ?   SpO2 10/03/21 1503 98 %  ?   Weight --   ?   Height 10/03/21 1452 6\' 5"  (1.956 m)  ?   Head Circumference --   ?   Peak Flow --   ?   Pain Score 10/03/21 1452 0  ?   Pain Loc --   ?   Pain Edu? --   ?   Excl. in [place]? --   ? ? ?Most recent vital signs: ?Vitals:  ? 10/03/21 1503 10/03/21 2012  ?BP: 95/65 115/74  ?Pulse: 86 (!) 121  ?Resp: 20 17  ?SpO2: 98% 97%  ? ? ?Constitutional: Alert and oriented. ?Eyes: Conjunctivae are normal. ?Head: Atraumatic. ?Nose: No congestion/rhinnorhea. ?Mouth/Throat: Mucous membranes are  moist.  ?Cardiovascular: Normal rate, regular rhythm. Grossly normal heart sounds.  2+ radial pulses bilaterally. ?Respiratory: Normal respiratory effort.  No retractions. Lungs CTAB. ?Gastrointestinal: Soft and nontender. No distention. ?Musculoskeletal: No lower extremity tenderness nor edema.  ?Neurologic:  Normal speech and language.  Variable weakness noted in left upper and lower extremities with strength appearing intact at times and then weak at others.  No facial droop noted. ? ? ? ?ED Results / Procedures / Treatments  ? ?Labs ?(all labs ordered are listed, but only abnormal results are displayed) ?Labs Reviewed  ?CBC WITH DIFFERENTIAL/PLATELET - Abnormal; Notable for the following components:  ?    Result Value  ? nRBC 0.4 (*)   ? All other components within normal limits  ?BASIC METABOLIC PANEL - Abnormal; Notable for the following components:  ? Chloride 96 (*)   ? Glucose, Bld 109 (*)   ? All other components within normal limits  ?AMMONIA - Abnormal; Notable for the following components:  ? Ammonia 61 (*)   ? All other components within normal limits  ?VALPROIC ACID LEVEL - Abnormal; Notable for the following components:  ? Valproic Acid Lvl 125 (*)   ?  All other components within normal limits  ?VALPROIC ACID LEVEL - Abnormal; Notable for the following components:  ? Valproic Acid Lvl 104 (*)   ? All other components within normal limits  ?CBG MONITORING, ED - Abnormal; Notable for the following components:  ? Glucose-Capillary 133 (*)   ? All other components within normal limits  ?HEPATIC FUNCTION PANEL  ? ? ? ?EKG ? ?ED ECG REPORT ?[name], the attending physician, personally viewed and interpreted this ECG. ? ? Date: 10/03/2021 ? EKG Time: 14:36 ? Rate: 77 ? Rhythm: atrial fibrillation ? Axis: Normal ? Intervals:none ? ST&T Change: Nonspecific ST/T wave changes, similar to previous ? ?RADIOLOGY ?Left shoulder x-ray reviewed by me with no fracture or  dislocation. ? ?PROCEDURES: ? ?Critical Care performed: No ? ?Procedures ? ? ?MEDICATIONS ORDERED IN ED: ?Medications  ?lactulose (CHRONULAC) 10 GM/15ML solution 20 g (20 g Oral Given 10/03/21 1606)  ? ? ? ?IMPRESSION / MDM / ASSESSMENT AND PLAN / ED COURSE  ?I reviewed the triage vital signs and the nursing notes. ?             ?               ? ?31 y.o. male with past medical history of diabetes, chronic atrial fibrillation on Eliquis, OSA, chronic respiratory failure on 4 L nasal cannula, schizoaffective disorder, conversion disorder, and generalized anxiety disorder who presents to the ED complaining of acute onset left-sided numbness and weakness starting this morning with whole body tingling and pain in his left shoulder. ? ?Differential diagnosis includes, but is not limited to, stroke, TIA, intracranial hemorrhage, conversion disorder, electrolyte abnormality, Depakote toxicity, hepatic encephalopathy, malingering. ? ?Patient well-appearing and in no acute distress, there was initial concern for stroke with patient's described symptoms and he was evaluated by Dr. Malen Gauze of neurology in triage.  His sensory complaints appear to have a sharp cut off in the midline that seems more consistent with functional etiology per neurology.  His neurologic exam on my evaluation also seems quite variable with patient displaying different levels of strength at different times, again consistent with a functional etiology.  We will screen CT head to ensure no hemorrhage given he is anticoagulated, pain component also argues against stroke.  BMP is unremarkable with no electrolyte abnormality or AKI, CBC without anemia or leukocytosis, no findings to suggest infectious process.  He is not in any respiratory distress and maintaining O2 sats on his usual 4 L nasal cannula.  Patient does have mildly elevated Depakote and ammonia levels, although symptoms do not appear concerning for significant Depakote toxicity or hepatic  encephalopathy.  He has no CNS depression, respiratory depression, disorientation, or asterixis.  We will give dose of lactulose but ammonia levels seem chronically elevated, which is likely related to his Depakote.  Plan to trend Depakote levels following dose of lactulose and per neurology, Depakote should eventually be weaned off due to interactions with Eliquis.  ? ?CT head is negative for acute process and no further stroke evaluation recommended per neurology.  Repeat Depakote level is downtrending and patient is appropriate for discharge home with outpatient neurology follow-up.  Dr. Rory Percy recommends gradual weaning of his Depakote dose given elevated levels and potential interaction with Eliquis.  Patient instructed to take 1000 mg of Depakote during the day and 1500 mg at night.  He was counseled to follow-up with neurology and return to the ED for new or worsening symptoms. ? ?  ? ? ?  FINAL CLINICAL IMPRESSION(S) / ED DIAGNOSES  ? ?Final diagnoses:  ?Left sided numbness  ?On valproic acid therapy  ?Hyperammonemia ([place])  ? ? ? ?Rx / DC Orders  ? ?ED Discharge Orders   ? ? None  ? ?  ? ? ? ?Note:  This document was prepared using Dragon voice recognition software and may include unintentional dictation errors. ?  ?Blake Divine, MD ?10/03/21 2057 ? ?

## 2021-10-03 NOTE — ED Notes (Signed)
Floor cleaned of emesis from patient, repeat valproic acid level collected at this time, EDP aware of patient's episode of emesis at this time, no new orders, repeat VS obtained.  ?

## 2021-10-03 NOTE — Discharge Instructions (Addendum)
You should decrease your Depakote dose to 1000 mg during the day and 1500 mg at night.  This medication can interact with your blood thinner and you will need to follow-up with a neurologist to be weaned off of it and start on a new medication.  Please also schedule follow-up with your primary care doctor and return to the ER for new or worsening symptoms. ?

## 2021-10-03 NOTE — ED Notes (Signed)
Pt endorsing BM at this time. Asking for food and drink ?

## 2021-10-03 NOTE — ED Notes (Signed)
This RN to bedside due to patient calling out, pt states he did not his call bell, requesting to be covered up with a blanket, this RN assisted patient to be covered with a blanket. Pt denies further needs. Resting in bed with lights dimmed for comfort. Remains in A-fib on the monitor.  ?

## 2021-10-03 NOTE — ED Triage Notes (Addendum)
Pt via POV from [facility], initially when patient was triaged pt c/o L side pain. Once pt came into the triage room, pt states that he is having L sided numbness, L sided facial droop, and weakness. States symptoms started 10 mins ago. This RN knows patient well. Pt upset because he didn't go straight back to a room.  ? ?Dr. Michiel Sites, EDP in triage room to evaluation. Pt is A&OX4 and NAD. Per Dr. Katrinka Blazing no Code Stroke at this time. Pt wear 4L [place] chronically.  ? ?Dr Jerrell Belfast, MD with Neurology in triage also.  ?

## 2021-10-03 NOTE — ED Provider Notes (Signed)
Emergency Medicine Provider Triage Evaluation Note ? ?Daniel [name] , a 31 y.o. male  was evaluated in triage.  Pt complains of L arm weakness and numbness as well as pain in L shoulder.  Similar to multiple prior episodes of weakness and numbness in the left upper extremity while patient states his current episode started ? ?20 minutes prior to arrival.  No other acute sick symptoms aside from feeling little fatigued and "out of it".. ? ?Review of Systems  ?Positive: Left arm weakness and numbness.  Left shoulder pain. ?Negative: No other clear focal deficits. ? ?Physical Exam  ?There were no vitals taken for this visit. ?Gen:   Awake, no distress   ?Resp:  Normal effort  ?MSK:   Moves extremities without difficulty but is weaker in the left arm compared to the right.  Decree sensation to light touch in left upper extremity. ?Other:   ? ?Medical Decision Making  ?Medically screening exam initiated at 2:50 PM.  Appropriate orders placed.  Daniel [name] was informed that the remainder of the evaluation will be completed by another provider, this initial triage assessment does not replace that evaluation, and the importance of remaining in the ED until their evaluation is complete. ? ?I did discuss patient with on-call neurologist Dr. Jerrell Belfast as patient has had multiple ED visits this year including multiple neurologic work-ups with 3 MRIs in the last 2 months for very similar symptoms.  It seems that there was reportedly thought to be a functional component of patient's symptoms versus other none stroke related etiology.  After discussion with neurology I think it is appropriate to defer code stroke at this time given multiple prior episodes almost identical to today with reassuring MRI but Dr. Jerrell Belfast will come and see patient in triage unable consult note with any additional recommendations. ?  ?Gilles Chiquito, MD ?10/03/21 1452 ? ?

## 2021-10-03 NOTE — Plan of Care (Signed)
Received a call from Dr. Tamala Julian, ED provider regarding this patient with left-sided tingling and numbness.  Has been seen multiple times with similar symptoms and negative imaging.  He is on Eliquis for A-fib.  I briefly saw him in the waiting area at the request of Dr. Tamala Julian.  He has no LVO signs.  He does complain of sensory deficits on the left with sharp cut off in the midline raising suspicion for a functional component to his complaints.  That said, he is on Eliquis and a bleed should be ruled out if there is a sudden onset of a focal neurological deficit however subjective it might be. ? ?Recommendations ?CT head without contrast-do not think needs further evaluation if it is negative. ?He is on Depakote and Eliquis at home-says it is for seizures, recommended by psychiatry.  I would recommend choosing an alternative to Depakote as Eliquis and Depakote can have interactions.  I do not want to take him off of Depakote cold turkey-will need weaning down.  Lamotrigine should be a good alternative for both mood and seizures/PNES in a patient who is on DOACs. ?I discussed my plan with Dr. Tamala Julian. ?Please call with questions as needed. ? ? ?-- ?Amie Portland, MD ?Neurologist ?[facility] ?Pager: [phone] ?No charge note ?

## 2021-10-03 NOTE — ED Notes (Signed)
Pt endorsing they can taste ammonia and smell ammonia, pt stating they can tell that their ammonia is high.  ?

## 2021-10-05 ENCOUNTER — Other Ambulatory Visit: Payer: Self-pay

## 2021-10-05 ENCOUNTER — Emergency Department: Payer: Medicaid Other

## 2021-10-05 ENCOUNTER — Emergency Department
Admission: EM | Admit: 2021-10-05 | Discharge: 2021-10-05 | Disposition: A | Discharge status: Home / Self Care | Payer: Medicaid Other | Attending: Emergency Medicine | Admitting: Emergency Medicine

## 2021-10-05 ENCOUNTER — Encounter: Payer: Self-pay | Admitting: Emergency Medicine

## 2021-10-05 DIAGNOSIS — I482 Chronic atrial fibrillation, unspecified: Secondary | ICD-10-CM | POA: Diagnosis not present

## 2021-10-05 DIAGNOSIS — Y900 Blood alcohol level of less than 20 mg/100 ml: Secondary | ICD-10-CM | POA: Insufficient documentation

## 2021-10-05 DIAGNOSIS — Z7901 Long term (current) use of anticoagulants: Secondary | ICD-10-CM | POA: Diagnosis not present

## 2021-10-05 DIAGNOSIS — Z20822 Contact with and (suspected) exposure to covid-19: Secondary | ICD-10-CM | POA: Diagnosis not present

## 2021-10-05 DIAGNOSIS — R29810 Facial weakness: Secondary | ICD-10-CM | POA: Diagnosis not present

## 2021-10-05 DIAGNOSIS — R531 Weakness: Secondary | ICD-10-CM | POA: Diagnosis present

## 2021-10-05 DIAGNOSIS — F1910 Other psychoactive substance abuse, uncomplicated: Secondary | ICD-10-CM | POA: Insufficient documentation

## 2021-10-05 DIAGNOSIS — F444 Conversion disorder with motor symptom or deficit: Secondary | ICD-10-CM

## 2021-10-05 DIAGNOSIS — R2990 Unspecified symptoms and signs involving the nervous system: Secondary | ICD-10-CM

## 2021-10-05 DIAGNOSIS — E119 Type 2 diabetes mellitus without complications: Secondary | ICD-10-CM | POA: Diagnosis not present

## 2021-10-05 LAB — CBC
HCT: 43.9 % (ref 39.0–52.0)
Hemoglobin: 14 g/dL (ref 13.0–17.0)
MCH: 28.2 pg (ref 26.0–34.0)
MCHC: 31.9 g/dL (ref 30.0–36.0)
MCV: 88.5 fL (ref 80.0–100.0)
Platelets: 209 10*3/uL (ref 150–400)
RBC: 4.96 MIL/uL (ref 4.22–5.81)
RDW: 13.8 % (ref 11.5–15.5)
WBC: 4.9 10*3/uL (ref 4.0–10.5)
nRBC: 0 % (ref 0.0–0.2)

## 2021-10-05 LAB — URINALYSIS, ROUTINE W REFLEX MICROSCOPIC
Bilirubin Urine: NEGATIVE
Glucose, UA: NEGATIVE mg/dL
Hgb urine dipstick: NEGATIVE
Ketones, ur: 5 mg/dL — AB
Nitrite: NEGATIVE
Protein, ur: 100 mg/dL — AB
Specific Gravity, Urine: 1.026 (ref 1.005–1.030)
pH: 8 (ref 5.0–8.0)

## 2021-10-05 LAB — RESP PANEL BY RT-PCR (FLU A&B, COVID) ARPGX2
Influenza A by PCR: NEGATIVE
Influenza B by PCR: NEGATIVE
SARS Coronavirus 2 by RT PCR: NEGATIVE

## 2021-10-05 LAB — COMPREHENSIVE METABOLIC PANEL
ALT: 14 U/L (ref 0–44)
AST: 17 U/L (ref 15–41)
Albumin: 3.8 g/dL (ref 3.5–5.0)
Alkaline Phosphatase: 52 U/L (ref 38–126)
Anion gap: 8 (ref 5–15)
BUN: 18 mg/dL (ref 6–20)
CO2: 33 mmol/L — ABNORMAL HIGH (ref 22–32)
Calcium: 9.6 mg/dL (ref 8.9–10.3)
Chloride: 96 mmol/L — ABNORMAL LOW (ref 98–111)
Creatinine, Ser: 1.02 mg/dL (ref 0.61–1.24)
GFR, Estimated: >60 mL/min (ref >60)
Glucose, Bld: 97 mg/dL (ref 70–99)
Potassium: 4.5 mmol/L (ref 3.5–5.1)
Sodium: 137 mmol/L (ref 135–145)
Total Bilirubin: 0.8 mg/dL (ref 0.3–1.2)
Total Protein: 7.3 g/dL (ref 6.5–8.1)

## 2021-10-05 LAB — URINE DRUG SCREEN, QUALITATIVE (ARMC ONLY)
Amphetamines, Ur Screen: NOT DETECTED
Barbiturates, Ur Screen: NOT DETECTED
Benzodiazepine, Ur Scrn: NOT DETECTED
Cannabinoid 50 Ng, Ur ~~LOC~~: NOT DETECTED
Cocaine Metabolite,Ur ~~LOC~~: NOT DETECTED
MDMA (Ecstasy)Ur Screen: NOT DETECTED
Methadone Scn, Ur: NOT DETECTED
Opiate, Ur Screen: NOT DETECTED
Phencyclidine (PCP) Ur S: NOT DETECTED
Tricyclic, Ur Screen: POSITIVE — AB

## 2021-10-05 LAB — VALPROIC ACID LEVEL: Valproic Acid Lvl: 100 ug/mL (ref 50.0–100.0)

## 2021-10-05 LAB — DIFFERENTIAL
Abs Immature Granulocytes: 0.05 10*3/uL (ref 0.00–0.07)
Basophils Absolute: 0 10*3/uL (ref 0.0–0.1)
Basophils Relative: 1 %
Eosinophils Absolute: 0.1 10*3/uL (ref 0.0–0.5)
Eosinophils Relative: 2 %
Immature Granulocytes: 1 %
Lymphocytes Relative: 26 %
Lymphs Abs: 1.3 10*3/uL (ref 0.7–4.0)
Monocytes Absolute: 0.7 10*3/uL (ref 0.1–1.0)
Monocytes Relative: 14 %
Neutro Abs: 2.8 10*3/uL (ref 1.7–7.7)
Neutrophils Relative %: 56 %

## 2021-10-05 LAB — PROTIME-INR
INR: 1 (ref 0.8–1.2)
Prothrombin Time: 13 seconds (ref 11.4–15.2)

## 2021-10-05 LAB — ETHANOL: Alcohol, Ethyl (B): <10 mg/dL (ref <10)

## 2021-10-05 LAB — AMMONIA: Ammonia: 51 umol/L — ABNORMAL HIGH (ref 9–35)

## 2021-10-05 LAB — APTT: aPTT: 30 seconds (ref 24–36)

## 2021-10-05 NOTE — ED Notes (Signed)
Group home ([facility]) contacted at this time and informed of discharge instructions and need for ride.  ?

## 2021-10-05 NOTE — ED Triage Notes (Signed)
Pt walked into [employer] to have police call EMS for possible stroke. Pt has hx of afib, TIA, and seizures on depakote. Pt LNW at 0745 this AM and is currently having L sided weakness in arm and leg. Per EMS pt had dried blood on mouth, cleaned up prior to hospital arrival. Pt states he thinks he may be having seizures at night.  ? ?136/110 ?115 CBG ?HR 75 ?93% room air ?

## 2021-10-05 NOTE — ED Provider Notes (Signed)
? ?[facility] ?Provider Note ? ? ? Event Date/Time  ? First MD Initiated Contact with Patient 10/05/21 0830   ?  (approximate) ? ? ?History  ? ?Stroke Symptoms and Weakness ? ? ?HPI ? ?Daniel [name] is a 31 y.o. male with a history of diabetes, schizoaffective disorder, conversion disorder, atrial fibrillation on Eliquis who presents with left-sided weakness.  EMS reports they were called for possible CVA.  Patient does have a history of seizure disorder as well for which he takes Depakote ?  ? ? ?Physical Exam  ? ?Triage Vital Signs: ?ED Triage Vitals  ?Enc Vitals Group  ?   BP 10/05/21 0840 112/75  ?   Pulse Rate 10/05/21 0840 97  ?   Resp 10/05/21 0840 12  ?   Temp 10/05/21 0843 98.7 ?F (37.1 ?C)  ?   Temp Source 10/05/21 0843 Oral  ?   SpO2 10/05/21 0840 94 %  ?   Weight 10/05/21 0842 (!) 176 kg (388 lb 0.2 oz)  ?   Height 10/05/21 0842 1.956 m (6\' 5" )  ?   Head Circumference --   ?   Peak Flow --   ?   Pain Score 10/05/21 0841 9  ?   Pain Loc --   ?   Pain Edu? --   ?   Excl. in GC? --   ? ? ?Most recent vital signs: ?Vitals:  ? 10/05/21 0935 10/05/21 1015  ?BP:    ?Pulse:  91  ?Resp: 12 17  ?Temp:    ?SpO2:  93%  ? ? ? ?General: Awake, no distress.  ?CV:  Good peripheral perfusion.  ?Resp:  Normal effort.  ?Abd:  No distention.  ?Other:  Left arm and left leg weakness, left facial droop ? ? ?ED Results / Procedures / Treatments  ? ?Labs ?(all labs ordered are listed, but only abnormal results are displayed) ?Labs Reviewed  ?COMPREHENSIVE METABOLIC PANEL - Abnormal; Notable for the following components:  ?    Result Value  ? Chloride 96 (*)   ? CO2 33 (*)   ? All other components within normal limits  ?URINE DRUG SCREEN, QUALITATIVE ([facility] ONLY) - Abnormal; Notable for the following components:  ? Tricyclic, Ur Screen POSITIVE (*)   ? All other components within normal limits  ?AMMONIA - Abnormal; Notable for the following components:  ? Ammonia 51 (*)   ? All other components within  normal limits  ?RESP PANEL BY RT-PCR (FLU A&B, COVID) ARPGX2  ?ETHANOL  ?PROTIME-INR  ?APTT  ?CBC  ?DIFFERENTIAL  ?VALPROIC ACID LEVEL  ?URINALYSIS, ROUTINE W REFLEX MICROSCOPIC  ? ? ? ?EKG ? ?ED ECG REPORT ?I, Jene Every, the attending physician, personally viewed and interpreted this ECG. ? ?Date: 10/05/2021 ? ?Rhythm: Atrial fibrillation ?QRS Axis: normal ?Intervals: Abnormal ?ST/T Wave abnormalities: normal ?Narrative Interpretation: no evidence of acute ischemia ? ? ? ?RADIOLOGY ?CT head interpreted by me, no acute abnormality ? ? ? ?PROCEDURES: ? ?Critical Care performed: yes ? ?CRITICAL CARE ?Performed by: Jene Every ? ? ?Total critical care time: 30 minutes ? ?Critical care time was exclusive of separately billable procedures and treating other patients. ? ?Critical care was necessary to treat or prevent imminent or life-threatening deterioration. ? ?Critical care was time spent personally by me on the following activities: development of treatment plan with patient and/or surrogate as well as nursing, discussions with consultants, evaluation of patient's response to treatment, examination of patient, obtaining history from patient  or surrogate, ordering and performing treatments and interventions, ordering and review of laboratory studies, ordering and review of radiographic studies, pulse oximetry and re-evaluation of patient's condition. ? ? ?Procedures ? ? ?MEDICATIONS ORDERED IN ED: ?Medications - No data to display ? ? ?IMPRESSION / MDM / ASSESSMENT AND PLAN / ED COURSE  ?I reviewed the triage vital signs and the nursing notes. ? ?Patient presents with left-sided weakness, left facial droop as detailed above.  Differential includes stroke, seizure, conversion . this is in the setting of history of reported conversion disorder, 3 MRIs in the last 70-month which have been reassuring, recently seen by neurology 2 days ago in the emergency department.  Regardless given his symptoms and concerning  neurological findings have consulted neurology to determine if the patient should be a code stroke given he is within the window. ? ?I have consulted Dr. Curly Shores of neurology, she will evaluate the patient in the emergency department ? ?Dr. Curly Shores has recommended CT of the head, we are attempting to draw labs, patient is a difficult access ? ?Neurology feels that patient is appropriate for outpatient follow-up if reassuring labs.  Patient seems to be moving his arms and legs normally again ? ?Lab work reviewed and is reassuring, CMP normal, CBC unremarkable, valproic acid within normal range, alcohol level normal ? ?Patient appears to be at his baseline, appropriate discharge at this time ? ?  ? ? ?FINAL CLINICAL IMPRESSION(S) / ED DIAGNOSES  ? ?Final diagnoses:  ?Weakness  ? ? ? ?Rx / DC Orders  ? ?ED Discharge Orders   ? ? None  ? ?  ? ? ? ?Note:  This document was prepared using Dragon voice recognition software and may include unintentional dictation errors. ?  ?Lavonia Drafts, MD ?10/05/21 1058 ? ?

## 2021-10-05 NOTE — Consult Note (Signed)
Neurology Consultation ?Reason for Consult: Left-sided weakness ?Requesting Physician: Lavonia Drafts ? ?CC: Left-sided weakness ? ?History is obtained from: Patient, chart review and caregiver at bedside ? ?HPI: Daniel [name] is a 31 y.o. 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 ?History the patient provides a somewhat inconsistent.  He reports that he awoke with blood in his mouth, having bitten his right cheek.  He was walking normally and had shaking that started in his left leg and proceeded to involve his left arm, leaving him weak on the left side.  He denies having fallen.  He was able to walk to a fire station at which time EMS was activated and he was brought to the ED for further evaluation. ? ?He has a myriad of ED visits with multiple complaints including suicidal ideation, left-sided weakness, shortness of breath, cough, etc. ? ?When his caregiver was asked if he is ever seen the patient have a seizure he reports "he fakes."  Caregiver reports he will sit down on the floor and shake and then act weak on the left side ? ?Patient reports he last took his Depakote and Eliquis last evening and has not taken his morning medications.  He has myriad complaints including blurry vision when looking to the left, left-sided weakness numbness and tingling, shortness of breath, cough, chills, sweats, jerking all over his body, nausea, vomiting, but denies blood in his urine or stool ? ?He reports his Depakote level was elevated on last admission and that his dose has been reduced to 1000 in the morning and 1500 at night, with his  last dose last night ? ?LKW: 7:15 AM ?tPA given?: No, on Eliquis ?IA performed?: No, exam not consistent with LVO ?Premorbid modified rankin scale:  ?    2 - Slight disability. Able to look after own affairs without assistance, but unable to carry out all previous activities. ? ?ROS: All other review of systems was negative except as noted in the HPI.  ? ?Past Medical History:  ?Diagnosis Date  ? Atrial fibrillation ([place])   ? Cardiomyopathy ([place])   ? a. 07/2021 Echo: EF 50%, mild LVH, nl RV size/function. No significant valvular disease.  ? Chest pain   ? Conversion disorder   ? Current use of long term anticoagulation   ? Diabetes mellitus without complication ([place])   ? Hyperlipidemia   ? Hypertension   ? OSA (obstructive sleep apnea)   ? Persistent atrial fibrillation and flutter ([place])   ? a. CHA2DS2VASc = 3-4  ? Schizoaffective disorder ([place])   ? Seizure disorder ([place])   ? TIA (transient ischemic attack)   ? Urethral stricture   ? a. 08/2021 s/p cystoscopy and urethral dilation.  ? ?Past Surgical History:  ?Procedure Laterality Date  ? CYSTOSCOPY WITH URETHRAL DILATATION N/A 09/07/2021  ? Procedure: CYSTOSCOPY WITH URETHRAL DILATATION CATHETER PLACEMENT;  Surgeon: Abbie Sons, MD;  Location: [facility] ORS;  Service: Urology;  Laterality: N/A;  ? ?No current facility-administered medications for this encounter. ? ?Current Outpatient Medications:  ?  albuterol (VENTOLIN HFA) 108 (90 Base) MCG/ACT inhaler, Inhale 2 puffs into the lungs every 6 (six) hours as needed for wheezing., Disp: , Rfl:  ?  allopurinol (ZYLOPRIM)  300 MG tablet, Take 1 tablet (300 mg total) by mouth daily., Disp: 30 tablet, Rfl: 0 ?  aspirin 325 MG tablet, Take 325 mg by mouth daily., Disp: , Rfl:  ?  atorvastatin (LIPITOR) 80 MG tablet, Take 80 mg by mouth daily., Disp: , Rfl:  ?  baclofen (LIORESAL) 10 MG tablet, Take 1 tablet (10 mg total) by mouth 2 (two) times daily as needed for muscle spasms. Home med., Disp: 30 each, Rfl: 0 ?   cyclobenzaprine (FLEXERIL) 5 MG tablet, Take 5 mg by mouth 3 (three) times daily as needed for muscle spasms., Disp: , Rfl:  ?  diltiazem (CARDIZEM CD) 360 MG 24 hr capsule, Take 1 capsule (360 mg total) by mouth daily., Disp: 30 capsule, Rfl: 1 ?  divalproex (DEPAKOTE) 500 MG DR tablet, Take 2 tablets (1,000 mg total) by mouth 2 (two) times daily. (Patient taking differently: Take 1,500 mg by mouth 2 (two) times daily.), Disp: , Rfl:  ?  ELIQUIS 5 MG TABS tablet, Take 5 mg by mouth 2 (two) times daily., Disp: , Rfl:  ?  escitalopram (LEXAPRO) 10 MG tablet, Take 20 mg by mouth daily., Disp: , Rfl:  ?  fluticasone (FLONASE) 50 MCG/ACT nasal spray, Place 2 sprays into both nostrils 2 (two) times daily., Disp: , Rfl:  ?  furosemide (LASIX) 20 MG tablet, Take 1 tablet (20 mg total) by mouth daily., Disp: 30 tablet, Rfl: 0 ?  hydrochlorothiazide (HYDRODIURIL) 25 MG tablet, Take 25 mg by mouth daily., Disp: , Rfl:  ?  HYDROcodone-acetaminophen (NORCO/VICODIN) 5-325 MG tablet, Take 1 tablet by mouth every 4 (four) hours as needed., Disp: 15 tablet, Rfl: 0 ?  hydrOXYzine (ATARAX) 25 MG tablet, Take 25 mg by mouth daily. At noon, Disp: , Rfl:  ?  hydrOXYzine (ATARAX) 50 MG tablet, Take 50 mg by mouth in the morning and at bedtime., Disp: , Rfl:  ?  ibuprofen (ADVIL) 800 MG tablet, Take 800 mg by mouth every 8 (eight) hours as needed., Disp: , Rfl:  ?  INVEGA 9 MG 24 hr tablet, Take 9 mg by mouth every morning., Disp: , Rfl:  ?  loratadine (CLARITIN) 10 MG tablet, Take 10 mg by mouth daily., Disp: , Rfl:  ?  melatonin 3 MG TABS tablet, Take 3 mg by mouth at bedtime., Disp: , Rfl:  ?  metoprolol tartrate (LOPRESSOR) 100 MG tablet, Take 1 tablet (100 mg total) by mouth 2 (two) times daily. (Patient taking differently: Take 50 mg by mouth 2 (two) times daily.), Disp: 60 tablet, Rfl: 1 ?  omeprazole (PRILOSEC) 20 MG capsule, Take 20 mg by mouth daily., Disp: , Rfl:  ?  ondansetron (ZOFRAN-ODT) 4 MG disintegrating tablet, Take 1  tablet (4 mg total) by mouth every 6 (six) hours as needed for nausea or vomiting., Disp: 20 tablet, Rfl: 0 ?  oxybutynin (DITROPAN) 5 MG tablet, Take 1 tablet (5 mg total) by mouth 2 (two) times daily. (Patient not taking: Reported on 09/30/2021), Disp: 14 tablet, Rfl: 0 ?  phenazopyridine (PYRIDIUM) 100 MG tablet, Take 1 tablet (100 mg total) by mouth 3 (three) times daily with meals., Disp: 10 tablet, Rfl: 0 ?  promethazine (PHENERGAN) 25 MG tablet, Take 25 mg by mouth every 6 (six) hours as needed for nausea or vomiting., Disp: , Rfl:  ?  tamsulosin (FLOMAX) 0.4 MG CAPS capsule, Take 1 capsule (0.4 mg total) by mouth daily., Disp: 30 capsule, Rfl: 2 ?  traMADol (ULTRAM) 50 MG  tablet, Take 50 mg by mouth every 6 (six) hours as needed., Disp: , Rfl:  ? ? ?No family history on file. ? ? ?Social History:  reports that he has quit smoking. His smoking use included cigarettes. He has never used smokeless tobacco. He reports that he does not currently use alcohol. He reports that he does not use drugs. ? ? ?Exam: ?Current vital signs: ?BP 112/75   Pulse 97   Temp 98.7 ?F (37.1 ?C) (Oral)   Resp 12   Ht 6' 5"  (1.956 m)   Wt (!) 176 kg   SpO2 94%   BMI 46.01 kg/m?  ?Vital signs in last 24 hours: ?Temp:  [98.7 ?F (37.1 ?C)] 98.7 ?F (37.1 ?C) (05/09 3794) ?Pulse Rate:  [97] 97 (05/09 0840) ?Resp:  [12] 12 (05/09 0840) ?BP: (112)/(75) 112/75 (05/09 0840) ?SpO2:  [94 %] 94 % (05/09 0840) ?Weight:  [176 kg] 176 kg (05/09 0842) ? ? ?Physical Exam  ?Constitutional: Appears well-developed and well-nourished.  ?Psych: Affect calm and cooperative, flat ?Eyes: No scleral injection ?HENT: No oropharyngeal obstruction.  Cheek bite on the right inner cheek with some dried blood ?MSK: no joint deformities.  ?Cardiovascular: Atrial fibrillation occasionally with RVR.  Perfusing extremities well ?Respiratory: Effort normal, non-labored breathing, intermittently desats to 85%, reports he uses 3 L of oxygen at home ?GI: Soft.  No  distension. There is no tenderness.  ?Skin: Warm dry and intact visible skin ? ?Neuro: ?Mental Status: ?Patient is awake, alert, oriented to person, place, month, year, and situation. ?Patient is able to give a clear and coher

## 2021-10-07 ENCOUNTER — Encounter: Payer: Self-pay | Admitting: Intensive Care

## 2021-10-07 ENCOUNTER — Ambulatory Visit (INDEPENDENT_AMBULATORY_CARE_PROVIDER_SITE_OTHER): Payer: Medicaid Other | Admitting: Nurse Practitioner

## 2021-10-07 ENCOUNTER — Other Ambulatory Visit: Payer: Self-pay

## 2021-10-07 ENCOUNTER — Emergency Department
Admission: EM | Admit: 2021-10-07 | Discharge: 2021-10-07 | Disposition: A | Discharge status: Home / Self Care | Payer: Medicaid Other | Attending: Emergency Medicine | Admitting: Emergency Medicine

## 2021-10-07 ENCOUNTER — Encounter: Payer: Self-pay | Admitting: Nurse Practitioner

## 2021-10-07 VITALS — BP 100/50 | HR 80 | Ht 77.0 in | Wt 390.0 lb

## 2021-10-07 DIAGNOSIS — E119 Type 2 diabetes mellitus without complications: Secondary | ICD-10-CM | POA: Diagnosis not present

## 2021-10-07 DIAGNOSIS — I4891 Unspecified atrial fibrillation: Secondary | ICD-10-CM | POA: Diagnosis not present

## 2021-10-07 DIAGNOSIS — I4811 Longstanding persistent atrial fibrillation: Secondary | ICD-10-CM | POA: Diagnosis not present

## 2021-10-07 DIAGNOSIS — Z7901 Long term (current) use of anticoagulants: Secondary | ICD-10-CM | POA: Insufficient documentation

## 2021-10-07 DIAGNOSIS — R300 Dysuria: Secondary | ICD-10-CM

## 2021-10-07 DIAGNOSIS — G4730 Sleep apnea, unspecified: Secondary | ICD-10-CM

## 2021-10-07 DIAGNOSIS — G40909 Epilepsy, unspecified, not intractable, without status epilepticus: Secondary | ICD-10-CM

## 2021-10-07 DIAGNOSIS — I10 Essential (primary) hypertension: Secondary | ICD-10-CM | POA: Diagnosis not present

## 2021-10-07 MED ORDER — CEPHALEXIN 500 MG PO CAPS: 500.0000 mg | ORAL_CAPSULE | Freq: Two times a day (BID) | ORAL | 0 refills | Status: DC

## 2021-10-07 MED ORDER — NAPROXEN 500 MG PO TABS: 500.0000 mg | ORAL_TABLET | Freq: Two times a day (BID) | ORAL | 2 refills | Status: DC

## 2021-10-07 NOTE — ED Provider Notes (Signed)
MSE was initiated and I personally evaluated the patient and placed orders (if any) at  12:13 PM on Oct 07, 2021. ? ?The patient appears stable so that the remainder of the MSE may be completed by another provider. ?  ?Jene Every, MD ?10/07/21 1213 ? ?

## 2021-10-07 NOTE — Progress Notes (Signed)
? ? ?Office Visit  ?  ?Patient Name: Daniel [name] ?Date of Encounter: 10/07/2021 ? ?Primary Care Provider:  Anselm Jungling, NP ?Primary Cardiologist:  Yvonne Kendall, MD ? ?Chief Complaint  ?  ?31 year old male with a history of hypertension, hyperlipidemia, sleep disordered breathing, obesity, possible TIA, low normal LV function, schizoaffective disorder, seizure disorder, conversion disorder, and persistent atrial fibrillation/flutter, who presents for follow-up of atrial fibrillation. ? ?Past Medical History  ?  ?Past Medical History:  ?Diagnosis Date  ? Atrial fibrillation (HCC)   ? Cardiomyopathy (HCC)   ? a. 07/2021 Echo: EF 50%, mild LVH, nl RV size/function. No significant valvular disease.  ? Chest pain   ? Conversion disorder   ? Current use of long term anticoagulation   ? Diabetes mellitus without complication (HCC)   ? Hyperlipidemia   ? Hypertension   ? OSA (obstructive sleep apnea)   ? Persistent atrial fibrillation and flutter (HCC)   ? a. CHA2DS2VASc = 3-4  ? Schizoaffective disorder (HCC)   ? Seizure disorder (HCC)   ? TIA (transient ischemic attack)   ? Urethral stricture   ? a. 08/2021 s/p cystoscopy and urethral dilation.  ? ?Past Surgical History:  ?Procedure Laterality Date  ? CYSTOSCOPY WITH URETHRAL DILATATION N/A 09/07/2021  ? Procedure: CYSTOSCOPY WITH URETHRAL DILATATION CATHETER PLACEMENT;  Surgeon: Riki Altes, MD;  Location: [facility] ORS;  Service: Urology;  Laterality: N/A;  ? ? ?Allergies ? ?Allergies  ?Allergen Reactions  ? Haldol [Haloperidol] Other (See Comments)  ?  SI  ? Abilify [Aripiprazole] Palpitations  ? Demerol [Meperidine Hcl] Hives  ? ? ?History of Present Illness  ?  ?31 year old male with above complex past medical history including hypertension, hyperlipidemia, sleep disordered breathing, obesity, possible TIA, low normal LV function, schizoaffective disorder, seizure disorder, conversion disorder, and persistent atrial fibrillation and flutter.  He reports a  history of atrial fibrillation dating back to the age of 65 and has been managed with diltiazem and Eliquis in the outpatient setting by his previous cardiology team in [place].  He moved to the [place] area earlier this year to live in a group home and was admitted March 6 with chest pain and palpitations.  He was noted to be in atrial fibrillation.  There was a question of TIA symptoms and neuro work-up was unremarkable.  He was seen by EP and was felt to be a poor candidate for catheter ablation secondary to obesity, nonadherence, and untreated sleep apnea.  Echo at that time showed an EF of 50% with mild LVH and normal RV size and function.  He was discharged home on metoprolol, diltiazem, and Xarelto.  Metoprolol dose was reduced to 50 mg twice daily in the setting of soft blood pressures and weakness and fall in late March.  Since early April, he has been dealing with urinary frequency and recurrent UTIs.  He was previously noted to have postvoid residual of 600 mL requiring Foley catheter placement.  He was seen by urology and underwent cystoscopy and urethral dilation on April 11.  Postprocedure, he complained of pelvic discomfort and was noted to have rapid atrial flutter with rates into the 140s.  With pain management and resumption of his beta-blocker and diltiazem, rates improved.  He was subsequently discharged with plan for outpatient follow-up.  Unfortunately, he has had 13 emergency department visits since April 16 for a multitude of complaints including ongoing urologic issues on several visits, fall, headache, dyspnea/respiratory failure, altered mental status, suicidal ideation, weakness, and  left-sided numbness.  On May 9, he was evaluated due to left-sided weakness and neurology recommended psychiatric evaluation as history, signs, and symptoms seem to be inconsistent and fluctuating.  There was some concern about use of Depakote and potential interaction for Eliquis, which could reduce  efficacy of Eliquis.  He was seen earlier today due to urinary tract infection and is now on cephalexin. ? ?From a cardiac standpoint, patient notes occasional tachypalpitations, typically occurring after awakening suddenly in the middle of the night, feeling dyspneic.  He believes he has sleep apnea but has never been tested.  He notes that he has been referred for sleep study but this is not scheduled until June.  In the setting of tachycardia he may or may not experience mild discomfort.  He has some degree of chronic dyspnea and is on oxygen at home, and says that he has been on oxygen intermittently ever since having COVID a year ago.  He does not have a pulmonologist currently.  He denies orthopnea, dizziness, syncope, edema, or early satiety. ? ?Home Medications  ?  ?Current Outpatient Medications  ?Medication Sig Dispense Refill  ? albuterol (VENTOLIN HFA) 108 (90 Base) MCG/ACT inhaler Inhale 2 puffs into the lungs every 6 (six) hours as needed for wheezing.    ? allopurinol (ZYLOPRIM) 300 MG tablet Take 1 tablet (300 mg total) by mouth daily. 30 tablet 0  ? atorvastatin (LIPITOR) 80 MG tablet Take 80 mg by mouth daily.    ? baclofen (LIORESAL) 10 MG tablet Take 1 tablet (10 mg total) by mouth 2 (two) times daily as needed for muscle spasms. Home med. 30 each 0  ? cephALEXin (KEFLEX) 500 MG capsule Take 1 capsule (500 mg total) by mouth 2 (two) times daily. 14 capsule 0  ? cyclobenzaprine (FLEXERIL) 5 MG tablet Take 5 mg by mouth 3 (three) times daily as needed for muscle spasms.    ? diltiazem (CARDIZEM CD) 360 MG 24 hr capsule Take 1 capsule (360 mg total) by mouth daily. 30 capsule 1  ? divalproex (DEPAKOTE) 500 MG DR tablet Take 2 tablets (1,000 mg total) by mouth 2 (two) times daily.    ? ELIQUIS 5 MG TABS tablet Take 5 mg by mouth 2 (two) times daily.    ? escitalopram (LEXAPRO) 10 MG tablet Take 20 mg by mouth daily.    ? fluticasone (FLONASE) 50 MCG/ACT nasal spray Place 2 sprays into both nostrils 2  (two) times daily.    ? furosemide (LASIX) 20 MG tablet Take 1 tablet (20 mg total) by mouth daily. 30 tablet 0  ? HYDROcodone-acetaminophen (NORCO/VICODIN) 5-325 MG tablet Take 1 tablet by mouth every 4 (four) hours as needed. 15 tablet 0  ? hydrOXYzine (ATARAX) 25 MG tablet Take 25 mg by mouth daily. At noon    ? hydrOXYzine (ATARAX) 50 MG tablet Take 50 mg by mouth in the morning and at bedtime.    ? ibuprofen (ADVIL) 800 MG tablet Take 800 mg by mouth every 8 (eight) hours as needed.    ? INVEGA 9 MG 24 hr tablet Take 9 mg by mouth every morning.    ? loratadine (CLARITIN) 10 MG tablet Take 10 mg by mouth daily.    ? melatonin 3 MG TABS tablet Take 3 mg by mouth at bedtime.    ? metoprolol tartrate (LOPRESSOR) 50 MG tablet Take 50 mg by mouth 2 (two) times daily.    ? naproxen (NAPROSYN) 500 MG tablet Take 1 tablet (  500 mg total) by mouth 2 (two) times daily with a meal. 20 tablet 2  ? omeprazole (PRILOSEC) 20 MG capsule Take 20 mg by mouth daily.    ? ondansetron (ZOFRAN-ODT) 4 MG disintegrating tablet Take 1 tablet (4 mg total) by mouth every 6 (six) hours as needed for nausea or vomiting. 20 tablet 0  ? oxybutynin (DITROPAN) 5 MG tablet Take 1 tablet (5 mg total) by mouth 2 (two) times daily. 14 tablet 0  ? phenazopyridine (PYRIDIUM) 100 MG tablet Take 1 tablet (100 mg total) by mouth 3 (three) times daily with meals. 10 tablet 0  ? promethazine (PHENERGAN) 25 MG tablet Take 25 mg by mouth every 6 (six) hours as needed for nausea or vomiting.    ? tamsulosin (FLOMAX) 0.4 MG CAPS capsule Take 1 capsule (0.4 mg total) by mouth daily. 30 capsule 2  ? traMADol (ULTRAM) 50 MG tablet Take 50 mg by mouth every 6 (six) hours as needed.    ? ?No current facility-administered medications for this visit.  ?  ? ?Review of Systems  ?  ?Occasional palps in setting of ? Apneic episodes as night described as awakening suddenly and gasping for air.  Occasionally has mild chest discomfort in the setting of palpitations, which  is typical for him and dates back many years.  He has chronic dyspnea on exertion and wears oxygen.  He denies PND, orthopnea, dizziness, syncope, edema, or early satiety.  All other systems reviewed and ar

## 2021-10-07 NOTE — Patient Instructions (Addendum)
Medication Instructions:  ?- Your physician has recommended you make the following change in your medication:  ? ?1) STOP hydrochlorothiazide (HCTZ)  ? ?2) STOP aspirin ? ?Please make sure to bring an updated medication list with you to every appointment ? ?*If you need a refill on your cardiac medications before your next appointment, please call your pharmacy* ? ? ?Lab Work: ?- none ordered ? ?If you have labs (blood work) drawn today and your tests are completely normal, you will receive your results only by: ?MyChart Message (if you have MyChart) OR ?A paper copy in the mail ?If you have any lab test that is abnormal or we need to change your treatment, we will call you to review the results. ? ? ?Testing/Procedures: ?- none ordered ? ? ?Follow-Up: ?At [facility], you and your health needs are our priority.  As part of our continuing mission to provide you with exceptional heart care, we have created designated Provider Care Teams.  These Care Teams include your primary Cardiologist (physician) and Advanced Practice Providers (APPs -  Physician Assistants and Nurse Practitioners) who all work together to provide you with the care you need, when you need it. ? ?We recommend signing up for the patient portal called "MyChart".  Sign up information is provided on this After Visit Summary.  MyChart is used to connect with patients for Virtual Visits (Telemedicine).  Patients are able to view lab/test results, encounter notes, upcoming appointments, etc.  Non-urgent messages can be sent to your provider as well.   ?To learn more about what you can do with MyChart, go to [email or web address].   ? ?Your next appointment:   ?2 month(s) ? ?The format for your next appointment:   ?In Person ? ?Provider:   ?You may see Nelva Bush, MD or one of the following Advanced Practice Providers on your designated Care Team:   ?Murray Hodgkins, NP ? ? ? ?Other Instructions ?N/a ? ?Important Information About  [facility] ? ? ? ? ? ? ? ? ? ?  ?  ? ?  ?

## 2021-10-07 NOTE — ED Triage Notes (Signed)
Patient c/o burning during urination and lower back pain ?

## 2021-10-10 ENCOUNTER — Encounter: Payer: Self-pay | Admitting: Emergency Medicine

## 2021-10-10 ENCOUNTER — Emergency Department: Payer: Medicaid Other

## 2021-10-10 DIAGNOSIS — Z7901 Long term (current) use of anticoagulants: Secondary | ICD-10-CM | POA: Insufficient documentation

## 2021-10-10 DIAGNOSIS — M25562 Pain in left knee: Secondary | ICD-10-CM | POA: Insufficient documentation

## 2021-10-10 DIAGNOSIS — R600 Localized edema: Secondary | ICD-10-CM | POA: Insufficient documentation

## 2021-10-10 DIAGNOSIS — I110 Hypertensive heart disease with heart failure: Secondary | ICD-10-CM | POA: Diagnosis not present

## 2021-10-10 DIAGNOSIS — G8929 Other chronic pain: Secondary | ICD-10-CM | POA: Diagnosis not present

## 2021-10-10 DIAGNOSIS — I509 Heart failure, unspecified: Secondary | ICD-10-CM | POA: Diagnosis not present

## 2021-10-10 DIAGNOSIS — R079 Chest pain, unspecified: Secondary | ICD-10-CM | POA: Diagnosis present

## 2021-10-10 DIAGNOSIS — R002 Palpitations: Secondary | ICD-10-CM | POA: Diagnosis not present

## 2021-10-10 LAB — BASIC METABOLIC PANEL
Anion gap: 8 (ref 5–15)
BUN: 16 mg/dL (ref 6–20)
CO2: 32 mmol/L (ref 22–32)
Calcium: 9.1 mg/dL (ref 8.9–10.3)
Chloride: 100 mmol/L (ref 98–111)
Creatinine, Ser: 1.05 mg/dL (ref 0.61–1.24)
GFR, Estimated: >60 mL/min (ref >60)
Glucose, Bld: 124 mg/dL — ABNORMAL HIGH (ref 70–99)
Potassium: 4.5 mmol/L (ref 3.5–5.1)
Sodium: 140 mmol/L (ref 135–145)

## 2021-10-10 LAB — TROPONIN I (HIGH SENSITIVITY): Troponin I (High Sensitivity): 4 ng/L (ref <18)

## 2021-10-10 LAB — CBC
HCT: 41.4 % (ref 39.0–52.0)
Hemoglobin: 13.3 g/dL (ref 13.0–17.0)
MCH: 28.9 pg (ref 26.0–34.0)
MCHC: 32.1 g/dL (ref 30.0–36.0)
MCV: 90 fL (ref 80.0–100.0)
Platelets: 149 10*3/uL — ABNORMAL LOW (ref 150–400)
RBC: 4.6 MIL/uL (ref 4.22–5.81)
RDW: 13.9 % (ref 11.5–15.5)
WBC: 6.6 10*3/uL (ref 4.0–10.5)
nRBC: 0 % (ref 0.0–0.2)

## 2021-10-10 LAB — BRAIN NATRIURETIC PEPTIDE: B Natriuretic Peptide: 103.9 pg/mL — ABNORMAL HIGH (ref 0.0–100.0)

## 2021-10-10 NOTE — ED Triage Notes (Signed)
Pt here from home with c/o chest pain and sob , pt states tha he ran out of O2 today  ?

## 2021-10-11 ENCOUNTER — Emergency Department
Admission: EM | Admit: 2021-10-11 | Discharge: 2021-10-11 | Disposition: A | Discharge status: Home / Self Care | Payer: Medicaid Other | Attending: Emergency Medicine | Admitting: Emergency Medicine

## 2021-10-11 DIAGNOSIS — R002 Palpitations: Secondary | ICD-10-CM

## 2021-10-11 DIAGNOSIS — R079 Chest pain, unspecified: Secondary | ICD-10-CM

## 2021-10-11 DIAGNOSIS — G8929 Other chronic pain: Secondary | ICD-10-CM

## 2021-10-11 DIAGNOSIS — R609 Edema, unspecified: Secondary | ICD-10-CM

## 2021-10-11 LAB — TROPONIN I (HIGH SENSITIVITY): Troponin I (High Sensitivity): 4 ng/L (ref <18)

## 2021-10-11 NOTE — ED Provider Notes (Signed)
? ?[facility] ?Provider Note ? ? ? Event Date/Time  ? First MD Initiated Contact with Patient 10/11/21 [phone]   ?  (approximate) ? ? ?History  ? ?No chief complaint on file. ? ? ?HPI ? ?Daniel [name] is a 31 y.o. male with extensive chronic medical history that includes schizoaffective disorder, anxiety disorder, hypertension, morbid obesity, chronic atrial fibrillation on anticoagulation, severe congestive heart failure with an EF of 20 to 25%, and seizure disorder. ? ?He has a legal guardian but lives in a private home.  He arrives for evaluation of chest pain.  He said it is similar to episodes he has had in the past.  He is worried because of the length and swelling recently.  He says he has been taking his medications.  He has not felt sick like with a fever.  He denies shortness of breath except with exertion. ?  ? ? ?Physical Exam  ? ?Triage Vital Signs: ?ED Triage Vitals  ?Enc Vitals Group  ?   BP 10/10/21 2123 104/81  ?   Pulse Rate 10/10/21 2123 70  ?   Resp 10/10/21 2123 18  ?   Temp 10/10/21 2123 98.5 ?F (36.9 ?C)  ?   Temp Source 10/10/21 2123 Oral  ?   SpO2 10/10/21 2123 94 %  ?   Weight 10/10/21 2121 (!) 176.9 kg (390 lb)  ?   Height 10/10/21 2121 1.956 m (6\' 5" )  ?   Head Circumference --   ?   Peak Flow --   ?   Pain Score 10/10/21 2121 10  ?   Pain Loc --   ?   Pain Edu? --   ?   Excl. in GC? --   ? ? ?Most recent vital signs: ?Vitals:  ? 10/11/21 0500 10/11/21 0600  ?BP: (!) 112/54 114/78  ?Pulse: 100 77  ?Resp: (!) 21 18  ?Temp:    ?SpO2: 94% 94%  ? ? ? ?General: Awake, no distress.  Sleeping comfortably but I checked on him. ?CV:  Good peripheral perfusion.  ?Resp:  Normal effort.  Lungs are clear to auscultation bilaterally. ?Abd:  Morbid obesity.  No tenderness to palpation of the abdomen. ?Other:  1+ pitting edema in bilateral lower extremities.  No left knee effusion.  No pain or tenderness with flexion extension of the left knee. ? ? ?ED Results / Procedures /  Treatments  ? ?Labs ?(all labs ordered are listed, but only abnormal results are displayed) ?Labs Reviewed  ?BASIC METABOLIC PANEL - Abnormal; Notable for the following components:  ?    Result Value  ? Glucose, Bld 124 (*)   ? All other components within normal limits  ?CBC - Abnormal; Notable for the following components:  ? Platelets 149 (*)   ? All other components within normal limits  ?BRAIN NATRIURETIC PEPTIDE - Abnormal; Notable for the following components:  ? B Natriuretic Peptide 103.9 (*)   ? All other components within normal limits  ?TROPONIN I (HIGH SENSITIVITY)  ?TROPONIN I (HIGH SENSITIVITY)  ? ? ? ?EKG ? ?ED ECG REPORT ?ILoleta Rose, Yamin Swingler, the attending physician, personally viewed and interpreted this ECG. ? ?Date: 10/10/2021 ?EKG Time: 21:30 ?Rate: 122 ?Rhythm: A-fib with RVR ?QRS Axis: normal ?Intervals: Abnormal due to A-fib, otherwise unremarkable ?ST/T Wave abnormalities: Non-specific ST segment / T-wave changes, but no clear evidence of acute ischemia. ?Narrative Interpretation: no definitive evidence of acute ischemia; does not meet STEMI criteria. ? ? ? ?RADIOLOGY ?  I personally viewed and interpreted the patient's chest x-ray and there is no evidence of pulmonary edema nor pneumonia. ? ? ? ?PROCEDURES: ? ?Critical Care performed: No ? ?.1-3 Lead EKG Interpretation ?Performed by: Loleta Rose, MD ?Authorized by: Loleta Rose, MD  ? ?  Interpretation: abnormal   ?  ECG rate:  78 ?  ECG rate assessment: normal   ?  Rhythm: atrial fibrillation   ?  Ectopy: none   ?  Conduction: normal   ? ? ?MEDICATIONS ORDERED IN ED: ?Medications - No data to display ? ? ?IMPRESSION / MDM / ASSESSMENT AND PLAN / ED COURSE  ?I reviewed the triage vital signs and the nursing notes. ?             ?               ? ?Differential diagnosis includes, but is not limited to, acute CHF exacerbation, ACS, PE, pneumonia, DVT, musculoskeletal pain, angina. ? ?The patient was on the cardiac monitor to evaluate for evidence  of arrhythmia and/or significant heart rate changes. ? ?Vital signs initially were notable for tachycardia but that resolved after the patient was triaged and he was stable throughout more than 9 hours in the emergency department.  By the time I saw him and checked on him twice, his heart rate was down in the 70s without intervention. ? ?EKG initially notable for A-fib with RVR but he has chronic atrial fibrillation and the rapid rate resolved. ? ?Labs ordered include CBC, basic metabolic panel, BNP, and high-sensitivity troponin x2. ? ?2 view chest x-ray ordered and interpreted by me as documented above with no evidence of pulmonary edema nor pneumonia. ? ?I reviewed his labs and they are generally quite reassuring with the past that only 103.9. ? ?The patient is numerous ED visits with this being his 21st visit in 6 months with 4 admissions.  He has been to [facility] as well as to the [facility] emergency department multiple times including for chest pain.  I reviewed the notes and see that his peripheral edema seems to be roughly at baseline. ? ?Although I considered admission given his severe chronic issues which could represent life-threatening conditions, his evaluation tonight was reassuring and he seems to be at his baseline.  No evidence of ischemia, no evidence of CHF exacerbation. ? ?He certainly has severe chronic disease and he seems to have trouble understanding the nature of his symptoms.  He is also nervous about them.  When I told him the good news that he would be able to go home, he told me about multiple other issues, such as sometimes feeling palpitations, and chronic pain in his left knee.  I provided reassurance about these issues but explained he did not stay need to stay in the hospital and he said "good, I am ready to go home!" ? ?I provided reassurance and his nurse spoke with his legal guardian who will come pick him up.  I recommended close outpatient follow-up and I gave my usual and customary  return precautions. ? ?  ? ? ?FINAL CLINICAL IMPRESSION(S) / ED DIAGNOSES  ? ?Final diagnoses:  ?Chest pain, unspecified type  ?Palpitations  ?Chronic pain of left knee  ?Peripheral edema  ? ? ? ?Rx / DC Orders  ? ?ED Discharge Orders   ? ? None  ? ?  ? ? ? ?Note:  This document was prepared using Dragon voice recognition software and may include unintentional dictation errors. ?  ?  Loleta Rose, MD ?10/11/21 1007 ? ?

## 2021-10-11 NOTE — ED Notes (Signed)
Called and spoke with group home (caregiver) Sela Hilding, advised pt is ready for d/c, reviewed pt's dx and labs. Caregiver verbalized understanding, reports will come around 8 am to take pt back to group home.  ?

## 2021-10-11 NOTE — ED Notes (Signed)
Pt wheeled to lobby to meet OJ Manson Passey by Conservation officer, nature. ?

## 2021-10-11 NOTE — Discharge Instructions (Addendum)
Your workup in the Emergency Department today was reassuring.  We did not find any specific abnormalities.  We recommend you drink plenty of fluids, take your regular medications and/or any new ones prescribed today, and follow up with the doctor(s) listed in these documents as recommended. ? ?We also recommend you follow-up with [facility] to discuss your ongoing issues with your left knee. ? ?Return to the Emergency Department if you develop new or worsening symptoms that concern you. ?

## 2021-10-11 NOTE — ED Notes (Signed)
Pt up to toilet. Pt provided with crackers and milk. Group home staff (Clayburn Pert) contacted, will wheel pt to lobby when staff arrives. ?

## 2021-10-11 NOTE — ED Notes (Signed)
Pt given a cup of diet sprite, chocolate milk, and two packs of graham crackers. ?

## 2021-10-11 NOTE — ED Notes (Signed)
Pt appears to be sleeping, NAD noted, even and unlabored RR, snoring heard, pt noted to have OSA, skin warm and dry, lights off to room for comfort per pt request. VSS,  safety in place, and call bell in reach for assistance.  ?

## 2021-10-14 NOTE — ED Provider Notes (Signed)
   [facility] Provider Note    Event Date/Time   First MD Initiated Contact with Patient 10/07/21 1230     (approximate)   History   Dysuria   HPI  Daniel [name] is a 31 y.o. male with a history of atrial fibrillation on Eliquis, diabetes, urethral stricture who presents with complaints of dysuria.  Patient reports he is having more frequent urination than typical.  Occasionally has some dysuria.  He denies penile discharge.  No recent sexual activity     Physical Exam   Triage Vital Signs: ED Triage Vitals  Enc Vitals Group     BP 10/07/21 1227 110/88     Pulse Rate 10/07/21 1227 80     Resp 10/07/21 1227 18     Temp 10/07/21 1227 99 F (37.2 C)     Temp Source 10/07/21 1227 Oral     SpO2 10/07/21 1227 94 %     Weight 10/07/21 1229 (!) 175 kg (385 lb 12.9 oz)     Height 10/07/21 1229 1.956 m (6\' 5" )     Head Circumference --      Peak Flow --      Pain Score 10/07/21 1227 10     Pain Loc --      Pain Edu? --      Excl. in GC? --     Most recent vital signs: Vitals:   10/07/21 1227  BP: 110/88  Pulse: 80  Resp: 18  Temp: 99 F (37.2 C)  SpO2: 94%     General: Awake, no distress.  CV:  Good peripheral perfusion.  Resp:  Normal effort.  Abd:  No distention.  Soft, nontender, no CVA tenderness Other:     ED Results / Procedures / Treatments   Labs (all labs ordered are listed, but only abnormal results are displayed) Labs Reviewed - No data to display   EKG     RADIOLOGY     PROCEDURES:  Critical Care performed:   Procedures   MEDICATIONS ORDERED IN ED: Medications - No data to display   IMPRESSION / MDM / ASSESSMENT AND PLAN / ED COURSE  I reviewed the triage vital signs and the nursing notes.  Patient with a history of diabetes, urethral stricture now with urinary frequency  Differential includes UTI, STI, ureterolithiasis  No flank pain no nausea no vomiting.  No penile discharge, reports more  urinary frequency than discomfort.    Will treat with Keflex, outpatient follow-up recommended          FINAL CLINICAL IMPRESSION(S) / ED DIAGNOSES   Final diagnoses:  Dysuria     Rx / DC Orders   ED Discharge Orders          Ordered    cephALEXin (KEFLEX) 500 MG capsule  2 times daily        10/07/21 1238    naproxen (NAPROSYN) 500 MG tablet  2 times daily with meals        10/07/21 1238             Note:  This document was prepared using Dragon voice recognition software and may include unintentional dictation errors.   12/07/21, MD 10/14/21 [phone]

## 2021-10-15 ENCOUNTER — Emergency Department
Admission: EM | Admit: 2021-10-15 | Discharge: 2021-10-15 | Disposition: A | Discharge status: Home / Self Care | Payer: Medicaid Other | Attending: Emergency Medicine | Admitting: Emergency Medicine

## 2021-10-15 ENCOUNTER — Other Ambulatory Visit: Payer: Self-pay

## 2021-10-15 ENCOUNTER — Emergency Department: Payer: Medicaid Other

## 2021-10-15 DIAGNOSIS — G8929 Other chronic pain: Secondary | ICD-10-CM | POA: Diagnosis not present

## 2021-10-15 DIAGNOSIS — I4891 Unspecified atrial fibrillation: Secondary | ICD-10-CM | POA: Insufficient documentation

## 2021-10-15 DIAGNOSIS — Z79899 Other long term (current) drug therapy: Secondary | ICD-10-CM | POA: Diagnosis not present

## 2021-10-15 DIAGNOSIS — I509 Heart failure, unspecified: Secondary | ICD-10-CM | POA: Diagnosis not present

## 2021-10-15 DIAGNOSIS — M25562 Pain in left knee: Secondary | ICD-10-CM | POA: Diagnosis not present

## 2021-10-15 DIAGNOSIS — I2510 Atherosclerotic heart disease of native coronary artery without angina pectoris: Secondary | ICD-10-CM | POA: Insufficient documentation

## 2021-10-15 DIAGNOSIS — R079 Chest pain, unspecified: Secondary | ICD-10-CM

## 2021-10-15 DIAGNOSIS — Z7901 Long term (current) use of anticoagulants: Secondary | ICD-10-CM | POA: Diagnosis not present

## 2021-10-15 LAB — BASIC METABOLIC PANEL
Anion gap: 7 (ref 5–15)
BUN: 13 mg/dL (ref 6–20)
CO2: 30 mmol/L (ref 22–32)
Calcium: 8.5 mg/dL — ABNORMAL LOW (ref 8.9–10.3)
Chloride: 101 mmol/L (ref 98–111)
Creatinine, Ser: 0.8 mg/dL (ref 0.61–1.24)
GFR, Estimated: >60 mL/min (ref >60)
Glucose, Bld: 104 mg/dL — ABNORMAL HIGH (ref 70–99)
Potassium: 4.3 mmol/L (ref 3.5–5.1)
Sodium: 138 mmol/L (ref 135–145)

## 2021-10-15 LAB — CBC
HCT: 40.3 % (ref 39.0–52.0)
Hemoglobin: 12.8 g/dL — ABNORMAL LOW (ref 13.0–17.0)
MCH: 28.3 pg (ref 26.0–34.0)
MCHC: 31.8 g/dL (ref 30.0–36.0)
MCV: 89 fL (ref 80.0–100.0)
Platelets: 162 10*3/uL (ref 150–400)
RBC: 4.53 MIL/uL (ref 4.22–5.81)
RDW: 14.3 % (ref 11.5–15.5)
WBC: 5.2 10*3/uL (ref 4.0–10.5)
nRBC: 0.4 % — ABNORMAL HIGH (ref 0.0–0.2)

## 2021-10-15 LAB — TROPONIN I (HIGH SENSITIVITY): Troponin I (High Sensitivity): 3 ng/L (ref <18)

## 2021-10-15 LAB — BRAIN NATRIURETIC PEPTIDE: B Natriuretic Peptide: 290.7 pg/mL — ABNORMAL HIGH (ref 0.0–100.0)

## 2021-10-15 MED ORDER — FUROSEMIDE 10 MG/ML IJ SOLN
40.0000 mg | Freq: Once | INTRAMUSCULAR | Status: AC
  Administered 2021-10-15: 40 mg via INTRAVENOUS
  Filled 2021-10-15: qty 4

## 2021-10-15 MED ORDER — FUROSEMIDE 20 MG PO TABS: 20.0000 mg | ORAL_TABLET | Freq: Every day | ORAL | 0 refills | Status: DC

## 2021-10-15 MED ORDER — OXYCODONE HCL 5 MG PO TABS
5.0000 mg | ORAL_TABLET | Freq: Once | ORAL | Status: AC
  Administered 2021-10-15: 5 mg via ORAL
  Filled 2021-10-15: qty 1

## 2021-10-15 NOTE — ED Notes (Signed)
Pt given meal tray, blanket, pillows.

## 2021-10-15 NOTE — ED Notes (Signed)
Gave pt juice and soda.

## 2021-10-15 NOTE — Discharge Instructions (Addendum)
Keep all of your scheduled appointments.  Return to the ER for symptoms that change, worsen, or for new concerns.

## 2021-10-15 NOTE — ED Notes (Signed)
Provider CT notified via secure chat of pt's request for pain med for chronic knee and ankle pain as well as sharp left and right CP.

## 2021-10-15 NOTE — ED Notes (Signed)
Called ortho unit to request ted hose for pt

## 2021-10-15 NOTE — ED Notes (Signed)
OJ Manson Passey (one of pt's caregivers) contacted at [phone] and notified pt being d/c. Manson Passey stated he will be here in about an hour to pick pt up.

## 2021-10-15 NOTE — ED Notes (Signed)
Repeat trop lg grn tube, red, and lav tubes sent to lab.

## 2021-10-15 NOTE — ED Notes (Signed)
Urine sample sent to lab

## 2021-10-15 NOTE — ED Notes (Signed)
Pt states his caregiver is on the way to pick him up. Will remain in room until caregiver arrives.

## 2021-10-15 NOTE — ED Notes (Signed)
Attempted to contact [facility] at [phone] to update and notify of pt's d/c; no answer; left a HIPAA compliant voicemail with this RN's name and ascom number to call back.

## 2021-10-15 NOTE — ED Triage Notes (Signed)
Pt comes with c/o CP and SOB. Pt wears O2 at home. Pt increased to 4L due to increased WOB. Pt states mid sternal chest wall pain. Pt states 10/10 pain at this time.  Legal guardian [name] called and informed of pt being seen here in ER.

## 2021-10-15 NOTE — ED Provider Notes (Signed)
[facility] Provider Note    Event Date/Time   First MD Initiated Contact with Patient 10/15/21 1125     (approximate)   History   Chest Pain and Shortness of Breath   HPI  Daniel [name] is a 31 y.o. male with history of diabetes, a-fib on eliquis, chronic respiratory failure on home oxygen, CHF and as listed in EMR presents to the emergency department for evaluation of chest pain and shortness of breath for the past 2 days. He is also experiencing increase in swelling of both legs.   Additionally, patient and caregiver are concerned about reading a diagnosis of CHF that was never discussed with them. They have discharge paperwork from last visits during the month of May that mentions EF of 20-25% and one that reads as 50%. They feel that this diagnosis has not been discussed with them.      Physical Exam   Triage Vital Signs: ED Triage Vitals  Enc Vitals Group     BP 10/15/21 0951 112/83     Pulse Rate 10/15/21 0945 78     Resp 10/15/21 0951 (!) 22     Temp 10/15/21 0952 98 F (36.7 C)     Temp src --      SpO2 10/15/21 0945 100 %     Weight --      Height --      Head Circumference --      Peak Flow --      Pain Score 10/15/21 0949 10     Pain Loc --      Pain Edu? --      Excl. in GC? --     Most recent vital signs: Vitals:   10/15/21 1515 10/15/21 1539  BP:  118/72  Pulse: 95 63  Resp:  20  Temp:  98.2 F (36.8 C)  SpO2: 98% 97%    General: Awake, no distress.  CV:  Good peripheral perfusion. 2+ pitting edema lower extremities bilaterally. Irregularly irregular.  Resp:  Normal effort. On 4 liters [place]. Breath sounds are clear Abd:  No distention. No ascites. Other:     ED Results / Procedures / Treatments   Labs (all labs ordered are listed, but only abnormal results are displayed) Labs Reviewed  BASIC METABOLIC PANEL - Abnormal; Notable for the following components:      Result Value   Glucose, Bld 104 (*)     Calcium 8.5 (*)    All other components within normal limits  CBC - Abnormal; Notable for the following components:   Hemoglobin 12.8 (*)    nRBC 0.4 (*)    All other components within normal limits  BRAIN NATRIURETIC PEPTIDE - Abnormal; Notable for the following components:   B Natriuretic Peptide 290.7 (*)    All other components within normal limits  TROPONIN I (HIGH SENSITIVITY)  TROPONIN I (HIGH SENSITIVITY)     EKG  ED ECG REPORT I, Jermisha Hoffart, FNP-BC personally viewed and interpreted this ECG.   Date: 10/15/2021  EKG Time: 0947  Rate: 107  Rhythm: atrial fibrillation, rate uncontrolled  Axis: normal  Intervals:none  ST&T Change: no ST elevation    RADIOLOGY  Image and radiology report reviewed by me.   Chest x-ray without acute cardiopulmonary abnormality on my interpretation.  PROCEDURES:  Critical Care performed: No  Procedures   MEDICATIONS ORDERED IN ED: Medications  oxyCODONE (Oxy IR/ROXICODONE) immediate release tablet 5 mg (5 mg Oral Given 10/15/21 1235)  furosemide (LASIX) injection 40 mg (40 mg Intravenous Given 10/15/21 1411)     IMPRESSION / MDM / ASSESSMENT AND PLAN / ED COURSE   I have reviewed the triage note.  Differential diagnosis includes, but is not limited to, CHF exacerbation, CAD, DVT, A-fib with RVR  31 year old male presenting to the emergency department for treatment and evaluation of chest pain and lower extremity swelling.  See HPI for further details.  Patient states that in general, he has not felt well for a week or more.  Chest began to hurt yesterday, but he also states that he "hurts all over." Chest pain is diffuse over the chest wall.  Patient has 2+ pitting edema of the lower extremities, however he states that he has been out of his Lasix for approximately 1 week and this was verified by the caregiver who is at bedside. He denies shortness of breath unless he is active.  Note from [name], NP in cardiology  dated 10/07/2021 reviewed.  No medication changes were made at that visit and he is to follow-up with cardiology in 6 to 8 weeks or sooner if needed.  He was also encouraged to see pulmonology for sleep study and evaluate for sleep apnea, which is scheduled for June.  Troponin is normal at 3, CBC is unremarkable, BMP also unremarkable, BNP is mildly elevated to 290.7 which has improved over the past 3 weeks where it was elevated to over 600.  He is complaining of left knee pain and is requesting a lockable hinged knee brace. He was advised to discuss this with his orthopedist or primary care provider. I do not have that type of brace here in the ER.  Patient is able to ambulate with assistance of his cane. Oxygen saturation remains around 93-95% on 2 liters via [place].   Plan will be to discharge him home with Rx for lasix to be taken daily. We also discussed follow up with the CHF clinic and information will be provided in the discharge paperwork. He is agreeable to the plan. Nursing staff to notify guardian that he is ready for discharge.       FINAL CLINICAL IMPRESSION(S) / ED DIAGNOSES   Final diagnoses:  Nonspecific chest pain  Chronic congestive heart failure, unspecified heart failure type (HCC)  Chronic pain of left knee     Rx / DC Orders   ED Discharge Orders          Ordered    furosemide (LASIX) 20 MG tablet  Daily        10/15/21 1452             Note:  This document was prepared using Dragon voice recognition software and may include unintentional dictation errors.   Chinita Pester, FNP 10/15/21 1707    Concha Se, MD 10/15/21 [phone]

## 2021-10-15 NOTE — ED Notes (Signed)
See triage note. Pt has recently been seen for same complaints. Currently on 4L; pt reports sometimes uses 3 or 4L at home; pt seen about 1-2 weeks ago and is at his baseline for SOB currently; visitor with pt. Pt sitting calmly on stretcher, skin dry and pt making lots of requests for pillows, food, and drinks.

## 2021-10-16 ENCOUNTER — Other Ambulatory Visit: Payer: Self-pay

## 2021-10-16 ENCOUNTER — Emergency Department: Payer: Medicaid Other

## 2021-10-16 ENCOUNTER — Emergency Department
Admission: EM | Admit: 2021-10-16 | Discharge: 2021-10-16 | Disposition: A | Discharge status: Other Acute Inpatient Hospital | Payer: Medicaid Other | Attending: Emergency Medicine | Admitting: Emergency Medicine

## 2021-10-16 ENCOUNTER — Emergency Department
Admission: EM | Admit: 2021-10-16 | Discharge: 2021-10-17 | Disposition: A | Discharge status: Home / Self Care | Payer: Medicaid Other | Source: Home / Self Care | Attending: Emergency Medicine | Admitting: Emergency Medicine

## 2021-10-16 DIAGNOSIS — I10 Essential (primary) hypertension: Secondary | ICD-10-CM | POA: Insufficient documentation

## 2021-10-16 DIAGNOSIS — I4891 Unspecified atrial fibrillation: Secondary | ICD-10-CM | POA: Insufficient documentation

## 2021-10-16 DIAGNOSIS — I509 Heart failure, unspecified: Secondary | ICD-10-CM | POA: Insufficient documentation

## 2021-10-16 DIAGNOSIS — R0602 Shortness of breath: Secondary | ICD-10-CM | POA: Diagnosis present

## 2021-10-16 DIAGNOSIS — Z79899 Other long term (current) drug therapy: Secondary | ICD-10-CM | POA: Diagnosis not present

## 2021-10-16 DIAGNOSIS — I110 Hypertensive heart disease with heart failure: Secondary | ICD-10-CM | POA: Diagnosis not present

## 2021-10-16 LAB — CBC
HCT: 39.6 % (ref 39.0–52.0)
HCT: 40.9 % (ref 39.0–52.0)
Hemoglobin: 12.3 g/dL — ABNORMAL LOW (ref 13.0–17.0)
Hemoglobin: 13.1 g/dL (ref 13.0–17.0)
MCH: 28.3 pg (ref 26.0–34.0)
MCH: 28.7 pg (ref 26.0–34.0)
MCHC: 31.1 g/dL (ref 30.0–36.0)
MCHC: 32 g/dL (ref 30.0–36.0)
MCV: 91 fL (ref 80.0–100.0)
MCV: 89.5 fL (ref 80.0–100.0)
Platelets: 162 10*3/uL (ref 150–400)
Platelets: 173 10*3/uL (ref 150–400)
RBC: 4.35 MIL/uL (ref 4.22–5.81)
RBC: 4.57 MIL/uL (ref 4.22–5.81)
RDW: 14.2 % (ref 11.5–15.5)
RDW: 14 % (ref 11.5–15.5)
WBC: 5.8 10*3/uL (ref 4.0–10.5)
WBC: 4.2 10*3/uL (ref 4.0–10.5)
nRBC: 0 % (ref 0.0–0.2)
nRBC: 0 % (ref 0.0–0.2)

## 2021-10-16 LAB — URINE DRUG SCREEN, QUALITATIVE (ARMC ONLY)
Amphetamines, Ur Screen: NOT DETECTED
Barbiturates, Ur Screen: NOT DETECTED
Benzodiazepine, Ur Scrn: NOT DETECTED
Cannabinoid 50 Ng, Ur ~~LOC~~: NOT DETECTED
Cocaine Metabolite,Ur ~~LOC~~: NOT DETECTED
MDMA (Ecstasy)Ur Screen: NOT DETECTED
Methadone Scn, Ur: NOT DETECTED
Opiate, Ur Screen: POSITIVE — AB
Phencyclidine (PCP) Ur S: NOT DETECTED
Tricyclic, Ur Screen: POSITIVE — AB

## 2021-10-16 LAB — BASIC METABOLIC PANEL
Anion gap: 8 (ref 5–15)
Anion gap: 9 (ref 5–15)
BUN: 14 mg/dL (ref 6–20)
BUN: 13 mg/dL (ref 6–20)
CO2: 34 mmol/L — ABNORMAL HIGH (ref 22–32)
CO2: 35 mmol/L — ABNORMAL HIGH (ref 22–32)
Calcium: 9 mg/dL (ref 8.9–10.3)
Calcium: 9 mg/dL (ref 8.9–10.3)
Chloride: 96 mmol/L — ABNORMAL LOW (ref 98–111)
Chloride: 94 mmol/L — ABNORMAL LOW (ref 98–111)
Creatinine, Ser: 0.91 mg/dL (ref 0.61–1.24)
Creatinine, Ser: 1.05 mg/dL (ref 0.61–1.24)
GFR, Estimated: >60 mL/min (ref >60)
GFR, Estimated: >60 mL/min (ref >60)
Glucose, Bld: 99 mg/dL (ref 70–99)
Glucose, Bld: 117 mg/dL — ABNORMAL HIGH (ref 70–99)
Potassium: 4.5 mmol/L (ref 3.5–5.1)
Potassium: 4.1 mmol/L (ref 3.5–5.1)
Sodium: 138 mmol/L (ref 135–145)
Sodium: 138 mmol/L (ref 135–145)

## 2021-10-16 LAB — TROPONIN I (HIGH SENSITIVITY)
Troponin I (High Sensitivity): 4 ng/L (ref <18)
Troponin I (High Sensitivity): 4 ng/L (ref <18)

## 2021-10-16 LAB — BRAIN NATRIURETIC PEPTIDE: B Natriuretic Peptide: 348.3 pg/mL — ABNORMAL HIGH (ref 0.0–100.0)

## 2021-10-16 MED ORDER — METOPROLOL TARTRATE 50 MG PO TABS
50.0000 mg | ORAL_TABLET | Freq: Two times a day (BID) | ORAL | Status: DC
  Administered 2021-10-16: 50 mg via ORAL
  Filled 2021-10-16: qty 1

## 2021-10-16 MED ORDER — DIVALPROEX SODIUM 500 MG PO DR TAB
1000.0000 mg | DELAYED_RELEASE_TABLET | Freq: Two times a day (BID) | ORAL | Status: DC
  Administered 2021-10-16: 1000 mg via ORAL
  Filled 2021-10-16: qty 2

## 2021-10-16 MED ORDER — FUROSEMIDE 10 MG/ML IJ SOLN
60.0000 mg | Freq: Once | INTRAMUSCULAR | Status: AC
  Administered 2021-10-16: 60 mg via INTRAVENOUS
  Filled 2021-10-16: qty 8

## 2021-10-16 MED ORDER — HYDROCODONE-ACETAMINOPHEN 5-325 MG PO TABS
1.0000 | ORAL_TABLET | Freq: Once | ORAL | Status: AC
  Administered 2021-10-16: 1 via ORAL
  Filled 2021-10-16: qty 1

## 2021-10-16 MED ORDER — PROMETHAZINE HCL 25 MG PO TABS: 25.0000 mg | ORAL_TABLET | Freq: Four times a day (QID) | ORAL | Status: DC | PRN

## 2021-10-16 MED ORDER — FUROSEMIDE 20 MG PO TABS: 40.0000 mg | ORAL_TABLET | Freq: Every day | ORAL | 0 refills | Status: DC

## 2021-10-16 MED ORDER — APIXABAN 5 MG PO TABS
5.0000 mg | ORAL_TABLET | Freq: Two times a day (BID) | ORAL | Status: DC
  Administered 2021-10-16: 5 mg via ORAL
  Filled 2021-10-16: qty 1

## 2021-10-16 MED ORDER — DILTIAZEM HCL ER COATED BEADS 180 MG PO CP24
360.0000 mg | ORAL_CAPSULE | Freq: Every day | ORAL | Status: DC
Start: 2021-10-16 — End: 2021-10-16
  Filled 2021-10-16: qty 2

## 2021-10-16 MED ORDER — METOPROLOL TARTRATE 50 MG PO TABS
50.0000 mg | ORAL_TABLET | Freq: Once | ORAL | Status: AC
  Administered 2021-10-16: 50 mg via ORAL
  Filled 2021-10-16: qty 1
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
## 2021-10-16 NOTE — ED Notes (Signed)
[employer] in to see patient

## 2021-10-16 NOTE — ED Notes (Signed)
Pharmacy contacted to verify meds

## 2021-10-16 NOTE — ED Notes (Signed)
Pt discharged to group home. Paper copy d/c paper signed . Denies questions or concerns. NAD noted. Pt ambulatory with cane.

## 2021-10-16 NOTE — Discharge Instructions (Addendum)
Please fill your prescription today 10/16/2021 and begin taking tomorrow 10/17/2021.

## 2021-10-16 NOTE — ED Provider Notes (Signed)
Vitals:   10/16/21 0712 10/16/21 0730  BP:  105/63  Pulse:  97  Resp:  11  Temp:    SpO2: 100% 97%     Patient resting comfortably at this time without distress.  He is understanding of plan for discharge, discussed that prescription for Lasix sent to [facility] by Dr. Elenora Gamma.  He does not appear to be in any distress.  He is alert, pleasant, and shows no respiratory distress at this time.  He is on home oxygen I also have sent message to [facility] of the CHF clinic requesting they call and schedule close follow-up for him as well  Group home staff picking him up   Sharyn Creamer, MD 10/16/21 [phone]

## 2021-10-16 NOTE — ED Notes (Signed)
[employer] called to come for pt to file a report.

## 2021-10-16 NOTE — ED Triage Notes (Signed)
Pt states he was diagnosed with CHF and is on 4L. Pt states he was discharged yesterday but unable to get his lasix prescription filled. Pt reports feeling short of breath and hurting all over.

## 2021-10-16 NOTE — ED Provider Notes (Signed)
[facility] Provider Note    Event Date/Time   First MD Initiated Contact with Patient 10/16/21 [phone]     (approximate)  History   Chief Complaint: Shortness of Breath  HPI  Daniel [name] is a 31 y.o. male with a past medical history of CHF, hypertension, hyperlipidemia, and schizophrenia, presents to the emergency department for shortness of breath.  According to the patient he has been feeling more short of breath.  Patient was seen in the emergency department here yesterday for the same ultimately was discharged home patient was to restart Lasix however he states the pharmacy that the group home uses does not open until Monday patient did not have any Lasix over the weekend so he came to the emergency department for shortness of breath.  Patient states generalized body aches as well.  Physical Exam   Triage Vital Signs: ED Triage Vitals  Enc Vitals Group     BP 10/16/21 0211 105/73     Pulse Rate 10/16/21 0211 (!) 104     Resp 10/16/21 0211 19     Temp 10/16/21 0211 98.5 F (36.9 C)     Temp Source 10/16/21 0211 Oral     SpO2 10/16/21 0211 98 %     Weight --      Height --      Head Circumference --      Peak Flow --      Pain Score 10/16/21 0212 10     Pain Loc --      Pain Edu? --      Excl. in GC? --     Most recent vital signs: Vitals:   10/16/21 0211  BP: 105/73  Pulse: (!) 104  Resp: 19  Temp: 98.5 F (36.9 C)  SpO2: 98%    General: Awake, no distress.  CV:  Good peripheral perfusion.  Regular rate and rhythm  Resp:  Normal effort.  Equal breath sounds bilaterally.  No wheeze rales or rhonchi Abd:  No distention.  Soft, nontender.  No rebound or guarding.    ED Results / Procedures / Treatments   EKG  ED EG viewed and interpreted by myself shows atrial fibrillation at 97 bpm with a narrow QRS, normal axis, normal intervals, nonspecific ST changes.  RADIOLOGY  I have interpreted the chest x-ray images patient appears  to have mild amount of haziness bilaterally consistent with possible pulmonary edema.   MEDICATIONS ORDERED IN ED: Medications  furosemide (LASIX) injection 60 mg (has no administration in time range)  HYDROcodone-acetaminophen (NORCO/VICODIN) 5-325 MG per tablet 1 tablet (has no administration in time range)     IMPRESSION / MDM / ASSESSMENT AND PLAN / ED COURSE  I reviewed the triage vital signs and the nursing notes.  Patient presents emergency department for shortness of breath.  Patient states he was supposed to restart Lasix but his prescription cannot be picked up until Monday.  Here the patient appears well, clear lung sounds on my examination.  Patient wears 4 L nasal cannula oxygen at baseline currently satting 98% on 4 L.  Chest x-ray does show interstitial edema.  We will check labs including cardiac enzymes and a BNP.  We will dose 60 mg of IV Lasix while awaiting further results.  Patient agreeable to plan of care.  Patient's lab work shows slightly elevated BNP compared to yesterday.  Troponin is negative.  CBC and BMP are normal.  Patient has received 60 mg of IV Lasix.  We will discharge with a short prescription for oral Lasix to cover him through the weekend and have the patient fill his prescription on Monday for ongoing Lasix.  Patient agreeable to plan of care.   FINAL CLINICAL IMPRESSION(S) / ED DIAGNOSES   Dyspnea CHF exacerbation   Note:  This document was prepared using Dragon voice recognition software and may include unintentional dictation errors.   Minna Antis, MD 10/16/21 0630

## 2021-10-16 NOTE — ED Notes (Signed)
Spoke with Greer Pickerel, pt's assigned caregiver who states "ain't nobody there need to hold his hand or anything", "you can call me when he's ready to go".

## 2021-10-16 NOTE — ED Notes (Signed)
Pt requesting to speak to MD. Dr Fanny Bien aware.
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
## 2021-10-16 NOTE — ED Notes (Signed)
Spoke with pt's gaurdian Daniel [name] to notify pt has arrived to ed. [name] states to call Daniel, pt's caregiver at phone number listed in chart. Spoke with Daniel [name] who states he is off this weekend and Tresa Endo is pt's caregiver this weekend and to notify her.

## 2021-10-17 LAB — TROPONIN I (HIGH SENSITIVITY): Troponin I (High Sensitivity): 4 ng/L (ref <18)

## 2021-10-17 LAB — VALPROIC ACID LEVEL: Valproic Acid Lvl: 59 ug/mL (ref 50.0–100.0)

## 2021-10-17 LAB — AMMONIA: Ammonia: 33 umol/L (ref 9–35)

## 2021-10-17 MED ORDER — HYDROCODONE-ACETAMINOPHEN 5-325 MG PO TABS
1.0000 | ORAL_TABLET | Freq: Once | ORAL | Status: AC
  Administered 2021-10-17: 1 via ORAL
  Filled 2021-10-17: qty 1

## 2021-10-17 NOTE — ED Provider Notes (Signed)
-----------------------------------------   2:33 AM on 10/17/2021 -----------------------------------------   Repeat troponin remains negative.  Patient is here with staff member from his group home.  Does not wish to leave.  We discussed that he does not meet admission criteria for hospitalization.  I did offer behavioral medicine evaluation but he declines.  He is concerned that his ammonia and Depakote levels are off.  We will check these levels.  ----------------------------------------- 4:25 AM on 10/17/2021 -----------------------------------------   Updated patient on unremarkable Ammonia and Depakote levels.  He is now satisfied and ready to be discharged.  Strict return precautions given.  Patient verbalizes understanding and agrees with plan of care.     Irean Hong, MD 10/17/21 [phone]

## 2021-10-17 NOTE — Discharge Instructions (Signed)
Continue to take your medicines as directed by your doctor.  Return to the ER for worsening symptoms, persistent vomiting, difficulty breathing or other concerns. 

## 2021-10-18 ENCOUNTER — Ambulatory Visit: Payer: Medicaid Other | Admitting: Physician Assistant

## 2021-10-18 NOTE — Progress Notes (Unsigned)
   Patient ID: Daniel [name], male    DOB: June 05, 1990, 31 y.o.   MRN: [id number]  HPI  Daniel [name] is a 31 y/o male with a history of  Echo report from 08/04/21 reviewed and showed an EF of 50% along with mild LVH  Was in the ED 10/16/21 due to chest pain and c/o current caregiver. Found to be in AF RVR. Given IV metoprolol along with IV lasix. Transitioned to oral medications. Head/cervical CT done because he says that he had a previous fall. Evaluated and subsequently released. Has had 7 other ED visits the month of May. In April had 9 ED visits along with 1 admission. Had 4 ED visits and 3 admissions in March.   He presents today for his initial visit with a chief complaint of   Review of Systems    Physical Exam     Assessment & Plan:  1: Chronic heart failure with preserved ejection fraction with structural changes (LVH)- - NYHA class - BNP 10/16/21 was 348.3  2: HTN- - BP - saw PCP - BMP 10/16/21 reviewed and showed sodium 138, potassium 4.1, creatinine 1.05 and GFR >60  3: Persistent atrial fibrillation- - saw cardiology Daniel [name]) 10/07/21  4: Schizophrenia-

## 2021-10-19 ENCOUNTER — Ambulatory Visit: Payer: Medicaid Other | Attending: Family | Admitting: Family

## 2021-10-19 ENCOUNTER — Encounter: Payer: Self-pay | Admitting: Family

## 2021-10-19 VITALS — BP 118/82 | HR 86 | Resp 16 | Ht 77.0 in | Wt 393.1 lb

## 2021-10-19 DIAGNOSIS — Z8673 Personal history of transient ischemic attack (TIA), and cerebral infarction without residual deficits: Secondary | ICD-10-CM | POA: Diagnosis not present

## 2021-10-19 DIAGNOSIS — I5032 Chronic diastolic (congestive) heart failure: Secondary | ICD-10-CM | POA: Diagnosis present

## 2021-10-19 DIAGNOSIS — G40909 Epilepsy, unspecified, not intractable, without status epilepticus: Secondary | ICD-10-CM | POA: Insufficient documentation

## 2021-10-19 DIAGNOSIS — Z7901 Long term (current) use of anticoagulants: Secondary | ICD-10-CM | POA: Diagnosis not present

## 2021-10-19 DIAGNOSIS — I4819 Other persistent atrial fibrillation: Secondary | ICD-10-CM | POA: Diagnosis not present

## 2021-10-19 DIAGNOSIS — E119 Type 2 diabetes mellitus without complications: Secondary | ICD-10-CM | POA: Diagnosis not present

## 2021-10-19 DIAGNOSIS — F209 Schizophrenia, unspecified: Secondary | ICD-10-CM | POA: Diagnosis not present

## 2021-10-19 DIAGNOSIS — I4811 Longstanding persistent atrial fibrillation: Secondary | ICD-10-CM | POA: Diagnosis not present

## 2021-10-19 DIAGNOSIS — G4730 Sleep apnea, unspecified: Secondary | ICD-10-CM

## 2021-10-19 DIAGNOSIS — G4733 Obstructive sleep apnea (adult) (pediatric): Secondary | ICD-10-CM | POA: Insufficient documentation

## 2021-10-19 DIAGNOSIS — E785 Hyperlipidemia, unspecified: Secondary | ICD-10-CM | POA: Diagnosis not present

## 2021-10-19 DIAGNOSIS — I110 Hypertensive heart disease with heart failure: Secondary | ICD-10-CM | POA: Diagnosis not present

## 2021-10-19 DIAGNOSIS — I10 Essential (primary) hypertension: Secondary | ICD-10-CM | POA: Diagnosis not present

## 2021-10-19 DIAGNOSIS — F250 Schizoaffective disorder, bipolar type: Secondary | ICD-10-CM

## 2021-10-19 DIAGNOSIS — Z79899 Other long term (current) drug therapy: Secondary | ICD-10-CM | POA: Diagnosis not present

## 2021-10-19 DIAGNOSIS — Z9981 Dependence on supplemental oxygen: Secondary | ICD-10-CM | POA: Diagnosis not present

## 2021-10-19 DIAGNOSIS — Z87891 Personal history of nicotine dependence: Secondary | ICD-10-CM | POA: Diagnosis not present

## 2021-10-19 NOTE — Patient Instructions (Signed)
Continue weighing daily and call for an overnight weight gain of 3 pounds or more or a weekly weight gain of more than 5 pounds.   If you have voicemail, please make sure your mailbox is cleaned out so that we may leave a message and please make sure to listen to any voicemails.     

## 2021-10-21 ENCOUNTER — Emergency Department: Payer: Medicaid Other

## 2021-10-21 ENCOUNTER — Emergency Department
Admission: EM | Admit: 2021-10-21 | Discharge: 2021-10-21 | Disposition: A | Discharge status: Home / Self Care | Payer: Medicaid Other | Attending: Emergency Medicine | Admitting: Emergency Medicine

## 2021-10-21 ENCOUNTER — Other Ambulatory Visit: Payer: Self-pay

## 2021-10-21 DIAGNOSIS — R609 Edema, unspecified: Secondary | ICD-10-CM | POA: Insufficient documentation

## 2021-10-21 DIAGNOSIS — I509 Heart failure, unspecified: Secondary | ICD-10-CM | POA: Diagnosis not present

## 2021-10-21 DIAGNOSIS — R0602 Shortness of breath: Secondary | ICD-10-CM | POA: Diagnosis present

## 2021-10-21 LAB — CBC
HCT: 41 % (ref 39.0–52.0)
Hemoglobin: 13.3 g/dL (ref 13.0–17.0)
MCH: 28.7 pg (ref 26.0–34.0)
MCHC: 32.4 g/dL (ref 30.0–36.0)
MCV: 88.6 fL (ref 80.0–100.0)
Platelets: 183 10*3/uL (ref 150–400)
RBC: 4.63 MIL/uL (ref 4.22–5.81)
RDW: 14 % (ref 11.5–15.5)
WBC: 5.7 10*3/uL (ref 4.0–10.5)
nRBC: 0 % (ref 0.0–0.2)

## 2021-10-21 LAB — BRAIN NATRIURETIC PEPTIDE: B Natriuretic Peptide: 153.9 pg/mL — ABNORMAL HIGH (ref 0.0–100.0)

## 2021-10-21 LAB — BASIC METABOLIC PANEL
Anion gap: 10 (ref 5–15)
BUN: 20 mg/dL (ref 6–20)
CO2: 32 mmol/L (ref 22–32)
Calcium: 9.2 mg/dL (ref 8.9–10.3)
Chloride: 95 mmol/L — ABNORMAL LOW (ref 98–111)
Creatinine, Ser: 0.91 mg/dL (ref 0.61–1.24)
GFR, Estimated: >60 mL/min (ref >60)
Glucose, Bld: 122 mg/dL — ABNORMAL HIGH (ref 70–99)
Potassium: 4.1 mmol/L (ref 3.5–5.1)
Sodium: 137 mmol/L (ref 135–145)

## 2021-10-21 NOTE — ED Triage Notes (Signed)
Pt arrives to ED via EMS from the group home with C/O SOB and leg swelling. PD stated pt CO chest pain, but when pt arrived at ED he denied chest pain.

## 2021-10-21 NOTE — ED Provider Notes (Signed)
[facility] Provider Note    Event Date/Time   First MD Initiated Contact with Patient 10/21/21 1043     (approximate)   History   Shortness of Breath   HPI  Daniel [name] is a 31 y.o. male history of atrial fibrillation congestive heart failure schizoaffective disorder conversion disorder  Patient presents today reports shortness of breath.  He was out walking when he started feeling short of breath.  He called EMS.  This is happened to him previously has been told due to congestive heart failure  He reports he is taking all his medications as prescribed he saw the heart failure clinic yesterday, he continues on Lasix.  He continues to have swelling in his legs.  Currently does not feel short of breath at rest but is most noticeable when he walks  Reports his legs are consistently swollen, did not appear to be necessarily better or worse.  No chest pain no headache.     Physical Exam   Triage Vital Signs: ED Triage Vitals  Enc Vitals Group     BP 10/21/21 1048 (!) 122/104     Pulse Rate 10/21/21 1048 93     Resp 10/21/21 1048 16     Temp 10/21/21 1048 97.9 F (36.6 C)     Temp Source 10/21/21 1048 Oral     SpO2 10/21/21 1048 95 %     Weight 10/21/21 1047 (!) 393 lb (178.3 kg)     Height 10/21/21 1047 6\' 5"  (1.956 m)     Head Circumference --      Peak Flow --      Pain Score 10/21/21 1045 6     Pain Loc --      Pain Edu? --      Excl. in [place]? --     Most recent vital signs: Vitals:   10/21/21 1048  BP: (!) 122/104  Pulse: 93  Resp: 16  Temp: 97.9 F (36.6 C)  SpO2: 95%     General: Awake, no distress.  CV:  Good peripheral perfusion.  Normal heart tones. Resp:  Normal effort.  Clear bilaterally.  Slightly diminished in the bases bilaterally suspect secondary to girth of chest.  He shows no evidence of respiratory distress.  Work of breathing is normal oxygen saturation 95% on room air Abd:  No distention.  Other:  He has  mild 1+ pitting edema to the level of the shins bilaterally.  No noted JVD, neck folds are however somewhat thick consistent with body habitus.   ED Results / Procedures / Treatments   Labs (all labs ordered are listed, but only abnormal results are displayed) Labs Reviewed  BASIC METABOLIC PANEL - Abnormal; Notable for the following components:      Result Value   Chloride 95 (*)    Glucose, Bld 122 (*)    All other components within normal limits  BRAIN NATRIURETIC PEPTIDE - Abnormal; Notable for the following components:   B Natriuretic Peptide 153.9 (*)    All other components within normal limits  CBC     EKG Is interpreted by me at 1047 Heart rate 100 QRS 99 QTc 450 Atrial fibrillation, no evidence of acute ischemia.  Mild nonspecific T wave abnormality noted in inferior (similar to 10-16-21)    RADIOLOGY  Chest x-ray reviewed by me negative for acute finding  DG Chest 2 View  Result Date: 10/21/2021 CLINICAL DATA:  Dyspnea. EXAM: CHEST - 2 VIEW COMPARISON:  Chest  radiographs 10/16/2021 FINDINGS: The cardiac silhouette remains borderline enlarged. The lungs are mildly hypoinflated. No airspace consolidation, edema, pleural effusion, or pneumothorax is identified. No acute osseous abnormality is seen. IMPRESSION: No active cardiopulmonary disease. Electronically Signed   By: Sebastian Ache M.D.   On: 10/21/2021 11:26       PROCEDURES:  Critical Care performed: No  Procedures   MEDICATIONS ORDERED IN ED: Medications - No data to display   IMPRESSION / MDM / ASSESSMENT AND PLAN / ED COURSE  I reviewed the triage vital signs and the nursing notes.                              Differential diagnosis includes, but is not limited to, CHF, chronic, possible CHF exacerbation, no signs or symptoms of infection, is a longstanding history of A-fib is anticoagulated, and no clinical signs that would suggest ACS pulmonary embolism, asthma or reactive airway disease type  presentation.  Suspect patient likely with chronic CHF, and this is likely exacerbated by his walking.  At rest he appears quite comfortable with normal and reassuring vitals and clear lungs on exam fully alert today.  Patient's presentation is most consistent with exacerbation of chronic illness.  The patient is on the cardiac monitor to evaluate for evidence of arrhythmia and/or significant heart rate changes.    Discussed with Ms. Baxley (guardian) at 1130AM and she reports frequent ED use due to conversion disorder as well, but has underlying medical issues. Agreeable with clinical eval and discharge if no indication for admission. Advises they have partners in community and considering an RN to work with him too as he is frequently at ERs.   Labs reviewed, BNP downtrending.  Metabolic panel normal.  CBC normal.    Discussed with his guardian, patient will be discharged back to his group home.  He will continue follow-up which includes CHF clinic, he has an echocardiogram scheduled for tomorrow, and I have notified [name] also a visit and placed follow-up referral  FINAL CLINICAL IMPRESSION(S) / ED DIAGNOSES   Final diagnoses:  Chronic congestive heart failure, unspecified heart failure type [facility])     Rx / DC Orders   ED Discharge Orders          Ordered    AMB referral to CHF clinic       Comments: CHF ED follow-up   10/21/21 1236             Note:  This document was prepared using Dragon voice recognition software and may include unintentional dictation errors.   Sharyn Creamer, MD 10/21/21 [id number]

## 2021-10-22 ENCOUNTER — Ambulatory Visit
Admission: RE | Admit: 2021-10-22 | Discharge: 2021-10-22 | Disposition: A | Discharge status: Home / Self Care | Payer: Medicaid Other | Source: Ambulatory Visit | Attending: Family | Admitting: Family

## 2021-10-22 DIAGNOSIS — I4891 Unspecified atrial fibrillation: Secondary | ICD-10-CM | POA: Diagnosis not present

## 2021-10-22 DIAGNOSIS — I5032 Chronic diastolic (congestive) heart failure: Secondary | ICD-10-CM | POA: Insufficient documentation

## 2021-10-22 DIAGNOSIS — I110 Hypertensive heart disease with heart failure: Secondary | ICD-10-CM | POA: Diagnosis present

## 2021-10-22 DIAGNOSIS — G4730 Sleep apnea, unspecified: Secondary | ICD-10-CM | POA: Diagnosis not present

## 2021-10-22 DIAGNOSIS — E119 Type 2 diabetes mellitus without complications: Secondary | ICD-10-CM | POA: Diagnosis not present

## 2021-10-22 LAB — ECHOCARDIOGRAM COMPLETE: S' Lateral: 3.4 cm

## 2021-10-22 NOTE — Progress Notes (Signed)
*  PRELIMINARY RESULTS* Echocardiogram 2D Echocardiogram has been performed.  Daniel [name] 10/22/2021, 11:36 AM

## 2021-10-24 ENCOUNTER — Emergency Department
Admission: EM | Admit: 2021-10-24 | Discharge: 2021-10-24 | Disposition: A | Discharge status: Home / Self Care | Payer: Medicaid Other | Attending: Emergency Medicine | Admitting: Emergency Medicine

## 2021-10-24 ENCOUNTER — Encounter: Payer: Self-pay | Admitting: Intensive Care

## 2021-10-24 ENCOUNTER — Other Ambulatory Visit: Payer: Self-pay

## 2021-10-24 DIAGNOSIS — E119 Type 2 diabetes mellitus without complications: Secondary | ICD-10-CM | POA: Diagnosis not present

## 2021-10-24 DIAGNOSIS — I10 Essential (primary) hypertension: Secondary | ICD-10-CM | POA: Diagnosis not present

## 2021-10-24 DIAGNOSIS — M545 Low back pain, unspecified: Secondary | ICD-10-CM | POA: Diagnosis present

## 2021-10-24 NOTE — ED Provider Notes (Signed)
Patient is repeatedly trying to exit the facility prior to his legal guardian/group home picking him up.  Given chronic suicidality, inability to arrange for safe transport to his group home will need to IVC the patient   Jene Every, MD 10/24/21 1154

## 2021-10-24 NOTE — ED Triage Notes (Signed)
Spoke with Daniel [name] at [facility], advised he was ready for discharge and transport back home. MD aware

## 2021-10-24 NOTE — ED Triage Notes (Signed)
Multiple attempts to contact group home and legal guardian not successful

## 2021-10-24 NOTE — ED Notes (Signed)
Patient found to be standing in triage with blanket wrapped around his neck pulling tightly. Face red in color. Patient asked to pull his hands down and let go of blanket. Patient continued to pull blanket tightly around neck. Security called. MD [name] made aware to come place eyes on patient.

## 2021-10-24 NOTE — ED Provider Notes (Addendum)
[facility] Provider Note    Event Date/Time   First MD Initiated Contact with Patient 10/24/21 1057     (approximate)   History   Back Pain   HPI  Daniel [name] is a 31 y.o. male with a history of cardiomyopathy, conversion disorder, diabetes, hypertension, back spasms on baclofen and muscle relaxers who presents with complaints of a right lower back spasm.  He reports that started last night.  Does feel improved today.  He denies any hematuria.  No difficulty urinating.  No dysuria.  No fevers or chills.     Physical Exam   Triage Vital Signs: ED Triage Vitals  Enc Vitals Group     BP 10/24/21 1032 111/69     Pulse Rate 10/24/21 1032 90     Resp 10/24/21 1032 20     Temp 10/24/21 1032 98 F (36.7 C)     Temp Source 10/24/21 1032 Oral     SpO2 10/24/21 1030 97 %     Weight 10/24/21 1031 (!) 179 kg (394 lb 9.6 oz)     Height 10/24/21 1031 1.956 m (6\' 5" )     Head Circumference --      Peak Flow --      Pain Score 10/24/21 1031 10     Pain Loc --      Pain Edu? --      Excl. in [place]? --     Most recent vital signs: Vitals:   10/24/21 1030 10/24/21 1032  BP:  111/69  Pulse:  90  Resp:  20  Temp:  98 F (36.7 C)  SpO2: 97% 96%     General: Awake, no distress.  Comfortable, relaxed CV:  Good peripheral perfusion.  Resp:  Normal effort.  Abd:  No distention.  Other:  No CVA tenderness, mild paraspinal right lumbar tenderness to palpation which mimics his pain,   ED Results / Procedures / Treatments   Labs (all labs ordered are listed, but only abnormal results are displayed) Labs Reviewed - No data to display   EKG     RADIOLOGY     PROCEDURES:  Critical Care performed:   Procedures   MEDICATIONS ORDERED IN ED: Medications - No data to display   IMPRESSION / MDM / [place] / ED COURSE  I reviewed the triage vital signs and the nursing notes. Patient's presentation is most consistent with  exacerbation of chronic illness.  Patient presents with back pain as detailed above, he has point tenderness in the lumbar paraspinal region and an area of spasm as well.  He has a history of this and this feels similar to prior episodes.  He does take baclofen and cyclobenzaprine for muscle spasms.  Area of pain is not consistent with CVA tenderness, not consistent with kidney stone, no dysuria.  No fever, normal vitals.  Quite comfortable in appearance  Notably the patient has a tendency to present to the emergency department frequently, has been here 25 times in the last 6 months.  Do not feel any lab work is necessary as he had reassuring lab work done 3 days ago.  Exam is reassuring, recommend continued outpatient treatment, follow-up with PCP as needed.  Informed patient that he was going to be discharged, he then started to bring up other complaints including depression.  Does not appear suicidal to me, no indication for IVC or psychiatric consultation at this time, appropriate for discharge to group home  ----------------------------------------- 11:44  AM on 10/24/2021 ----------------------------------------- Patient reportedly placed a blanket around his neck and was pretending to tighten it.  I reevaluated the patient.  He has a history of chronic suicidality, reviewed previous psychiatric consultation notes all of which have developed similar demonstrations.  Do not believe that the patient requires psychiatric consultation at this time and feel that he is malingering.      FINAL CLINICAL IMPRESSION(S) / ED DIAGNOSES   Final diagnoses:  Acute bilateral low back pain without sciatica     Rx / DC Orders   ED Discharge Orders     None        Note:  This document was prepared using Dragon voice recognition software and may include unintentional dictation errors.   Lavonia Drafts, MD 10/24/21 1114    Lavonia Drafts, MD 10/24/21 1122    Lavonia Drafts,  MD 10/24/21 1146

## 2021-10-24 NOTE — ED Triage Notes (Signed)
Pt caregiver here to pick him up and transport back to group home

## 2021-10-24 NOTE — ED Triage Notes (Signed)
Patient c/o lower back pain and shaky legs when trying to ambulate. Reports he has not slept in two nights. Wears 4L continuous O2 baseline. Patient states "I feel dehydrated"

## 2021-10-26 NOTE — Progress Notes (Unsigned)
Patient ID: Daniel [name], male    DOB: 1990-08-05, 31 y.o.   MRN: [id number]  HPI  Daniel [name] is a 31 y/o male with a history of DM, hyperlipidemia, HTN, atrial fibrillation, conversion disorder, sleep apnea, schizoaffective disorder, seizure, TIA, tobacco use and chronic heart failure.   Echo report from 10/22/21 reviewed and showed an EF of 45-50%. Echo report from 08/04/21 reviewed and showed an EF of 50% along with mild LVH  Was in the ED 10/24/21 and also 10/21/21. Was in the ED 10/16/21 due to chest pain and c/o current caregiver. Found to be in AF RVR. Given IV metoprolol along with IV lasix. Transitioned to oral medications. Head/cervical CT done because he says that he had a previous fall. Evaluated and subsequently released. Has had 7 other ED visits the month of May. In April had 9 ED visits along with 1 admission. Had 4 ED visits and 3 admissions in March.   He presents today for a follow-up visit with a chief complaint of moderate fatigue upon minimal exertion. Describes as chronic in nature. He has associated cough, shortness of breath, wheezing, intermittent chest pain, pedal edema, palpitations, vomiting, light-headedness, tremors (when urinating), anxiety and chronic difficulty sleeping. Denies any abdominal distention.   Unable to weigh because he says the scales keep saying "error". Requests a prescription for a rolling walker because of his SOB, knee pain and fatigue. Also asks for a prescription for Inogen portable oxygen concentrator. Currently only wears oxygen when he's at the group home.   Past Medical History:  Diagnosis Date   Atrial fibrillation ([place])    Cardiomyopathy ([place])    a. 07/2021 Echo: EF 50%, mild LVH, nl RV size/function. No significant valvular disease.   Chest pain    CHF (congestive heart failure) (HCC)    Conversion disorder    Current use of long term anticoagulation    Diabetes mellitus without complication (HCC)    Hyperlipidemia    Hypertension     OSA (obstructive sleep apnea)    Persistent atrial fibrillation and flutter ([place])    a. CHA2DS2VASc = 3-4   Schizoaffective disorder (HCC)    Seizure disorder (HCC)    TIA (transient ischemic attack)    Urethral stricture    a. 08/2021 s/p cystoscopy and urethral dilation.   Past Surgical History:  Procedure Laterality Date   CYSTOSCOPY WITH URETHRAL DILATATION N/A 09/07/2021   Procedure: CYSTOSCOPY WITH URETHRAL DILATATION CATHETER PLACEMENT;  Surgeon: Abbie Sons, MD;  Location: [facility] ORS;  Service: Urology;  Laterality: N/A;   No family history on file. Social History   Tobacco Use   Smoking status: Former    Types: Cigarettes   Smokeless tobacco: Never  Substance Use Topics   Alcohol use: Not Currently   Allergies  Allergen Reactions   Haldol [Haloperidol] Other (See Comments)    SI   Abilify [Aripiprazole] Palpitations   Demerol [Meperidine Hcl] Hives   Prior to Admission medications   Medication Sig Start Date End Date Taking? Authorizing Provider  albuterol (VENTOLIN HFA) 108 (90 Base) MCG/ACT inhaler Inhale 2 puffs into the lungs every 6 (six) hours as needed for wheezing. 07/12/21   [provider]  allopurinol (ZYLOPRIM) 300 MG tablet Take 1 tablet (300 mg total) by mouth daily. 08/29/21   Loletha Grayer, MD  atorvastatin (LIPITOR) 80 MG tablet Take 80 mg by mouth daily. 05/11/21   [provider]  baclofen (LIORESAL) 10 MG tablet Take 1 tablet (10 mg  total) by mouth 2 (two) times daily as needed for muscle spasms. Home med. 08/16/21   Darlin Priestly, MD  cyclobenzaprine (FLEXERIL) 5 MG tablet Take 5 mg by mouth 3 (three) times daily as needed for muscle spasms.    [provider]  diltiazem (CARDIZEM CD) 360 MG 24 hr capsule Take 1 capsule (360 mg total) by mouth daily. 09/10/21   Arnetha Courser, MD  divalproex (DEPAKOTE) 500 MG DR tablet Take 2 tablets (1,000 mg total) by mouth 2 (two) times daily. 09/25/21   Wouk, Wilfred Curtis, MD  ELIQUIS 5  MG TABS tablet Take 5 mg by mouth 2 (two) times daily. 06/16/21   [provider]  escitalopram (LEXAPRO) 10 MG tablet Take 20 mg by mouth daily. 06/16/21   [provider]  fluticasone (FLONASE) 50 MCG/ACT nasal spray Place 2 sprays into both nostrils 2 (two) times daily. 06/16/21   [provider]  HYDROcodone-acetaminophen (NORCO/VICODIN) 5-325 MG tablet Take 1 tablet by mouth every 4 (four) hours as needed. 09/16/21   Ward, Layla Maw, DO  hydrOXYzine (ATARAX) 25 MG tablet Take 25 mg by mouth daily. At noon    [provider]  hydrOXYzine (ATARAX) 50 MG tablet Take 50 mg by mouth in the morning and at bedtime.    [provider]  ibuprofen (ADVIL) 800 MG tablet Take 800 mg by mouth every 8 (eight) hours as needed.    [provider]  INVEGA 9 MG 24 hr tablet Take 9 mg by mouth every morning. 07/21/21   [provider]  loratadine (CLARITIN) 10 MG tablet Take 10 mg by mouth daily. 06/16/21   [provider]  melatonin 3 MG TABS tablet Take 3 mg by mouth at bedtime. 06/08/21   [provider]  metoprolol tartrate (LOPRESSOR) 50 MG tablet Take 50 mg by mouth 2 (two) times daily.    [provider]  naproxen (NAPROSYN) 500 MG tablet Take 1 tablet (500 mg total) by mouth 2 (two) times daily with a meal. 10/07/21   Jene Every, MD  omeprazole (PRILOSEC) 20 MG capsule Take 20 mg by mouth daily.    [provider]  ondansetron (ZOFRAN-ODT) 4 MG disintegrating tablet Take 1 tablet (4 mg total) by mouth every 6 (six) hours as needed for nausea or vomiting. 09/16/21   Ward, Layla Maw, DO  oxybutynin (DITROPAN) 5 MG tablet Take 1 tablet (5 mg total) by mouth 2 (two) times daily. 09/12/21   Sharyn Creamer, MD  phenazopyridine (PYRIDIUM) 100 MG tablet Take 1 tablet (100 mg total) by mouth 3 (three) times daily with meals. 09/09/21   Arnetha Courser, MD  promethazine (PHENERGAN) 25 MG tablet Take 25 mg by mouth every 6 (six)  hours as needed for nausea or vomiting.    [provider]  tamsulosin (FLOMAX) 0.4 MG CAPS capsule Take 1 capsule (0.4 mg total) by mouth daily. 09/10/21   Arnetha Courser, MD  traMADol (ULTRAM) 50 MG tablet Take 50 mg by mouth every 6 (six) hours as needed.    [provider]   Review of Systems  Constitutional:  Positive for fatigue (easily). Negative for appetite change.  HENT:  Positive for congestion and rhinorrhea. Negative for sore throat.   Eyes: Negative.   Respiratory:  Positive for apnea, cough, shortness of breath and wheezing.        + snoring  Cardiovascular:  Positive for chest pain (at times), palpitations (at times) and leg swelling.  Gastrointestinal:  Positive for vomiting. Negative for abdominal distention and abdominal pain.  Endocrine: Negative.   Genitourinary:  Negative for dysuria.       Has shaking in his left arm when urinating  Musculoskeletal:  Positive for arthralgias (left knee). Negative for back pain and neck pain.  Skin: Negative.   Allergic/Immunologic: Negative.   Neurological:  Positive for tremors (when urinating) and light-headedness. Negative for dizziness.  Hematological:  Negative for adenopathy. Does not bruise/bleed easily.  Psychiatric/Behavioral:  Positive for sleep disturbance (sleeping on 3 pillows). Negative for dysphoric mood. The patient is nervous/anxious.    Vitals:   10/27/21 0913  BP: 131/89  Pulse: 96  Resp: 18  SpO2: 95%  Weight: (!) 399 lb 8 oz (181.2 kg)  Height: 6\' 5"  (1.956 m)   Wt Readings from Last 3 Encounters:  10/27/21 (!) 399 lb 8 oz (181.2 kg)  10/24/21 (!) 394 lb 9.6 oz (179 kg)  10/21/21 (!) 393 lb (178.3 kg)   Lab Results  Component Value Date   CREATININE 0.91 10/21/2021   CREATININE 1.05 10/16/2021   CREATININE 0.91 10/16/2021    Physical Exam Vitals and nursing note reviewed.  Constitutional:      Appearance: Normal appearance.  HENT:     Head: Normocephalic and atraumatic.   Cardiovascular:     Rate and Rhythm: Normal rate. Rhythm irregular.  Pulmonary:     Effort: Pulmonary effort is normal. No respiratory distress.     Breath sounds: Rhonchi (RLL) present. No wheezing or rales.  Abdominal:     General: There is no distension.     Palpations: Abdomen is soft.  Musculoskeletal:        General: No tenderness.     Cervical back: Normal range of motion.     Right lower leg: Edema (trace pitting) present.     Left lower leg: Edema (trace pitting) present.  Skin:    General: Skin is warm and dry.  Neurological:     Mental Status: He is alert and oriented to person, place, and time. Mental status is at baseline.  Psychiatric:        Mood and Affect: Mood is anxious.        Behavior: Behavior normal.        Thought Content: Thought content normal.   Assessment & Plan:  1: Chronic heart failure with mildly reduced ejection fraction- - NYHA class III - euvolemic today - not weighing as he says the scales that were provided "aren't working"; advised caregiver to get him a scale so that he can weigh daily and call for an overnight weight gain of > 2 pounds or a weekly weight gain of > 5 pounds - weight up 6 pounds from last visit here 8 days ago - difficult for his sodium intake to be managed because he eats conveniently prepared foods to eat; encouraged to at least not add any salt to his food - reviewed recent echo results - will start entresto 24/26mg  BID; 1 sample bottle provided - consider adding SGLT2 but he is seeing urology later today so will defer - prescription written for rolling walker due to his left knee pain, SOB and fatigue - BNP 10/21/21 was 153.9  2: HTN- - BP looks good (131/89) - sees PCP at the group home but mentions wanting to get a different PCP; explained that he would need to discuss this with the group home as the PCP may have to be the provider that oversees the home -  BMP 10/21/21 reviewed and showed sodium 137, potassium 4.1,  creatinine 0.91 and GFR >60  3: Persistent atrial fibrillation- - saw cardiology Sharolyn Douglas) 10/07/21 - HR irregular today  4: Schizophrenia- - on invega daily  5: Sleep apnea- - caregiver says that he has a sleep evaluation in June - wearing oxygen at 4L around the clock when at the group home - prescription provided for Inogen portable oxygen to see if he can obtain that so he can wear it when out to appointments   Facility medication list reviewed. Caregiver stayed in the waiting room during office visit  Return in 1 week, sooner if needed.

## 2021-10-27 ENCOUNTER — Encounter: Payer: Self-pay | Admitting: Family

## 2021-10-27 ENCOUNTER — Encounter: Payer: Self-pay | Admitting: Emergency Medicine

## 2021-10-27 ENCOUNTER — Emergency Department
Admission: EM | Admit: 2021-10-27 | Discharge: 2021-10-28 | Disposition: A | Discharge status: Home / Self Care | Payer: Medicaid Other | Attending: Student in an Organized Health Care Education/Training Program | Admitting: Student in an Organized Health Care Education/Training Program

## 2021-10-27 ENCOUNTER — Encounter: Payer: Self-pay | Admitting: Urology

## 2021-10-27 ENCOUNTER — Emergency Department: Payer: Medicaid Other

## 2021-10-27 ENCOUNTER — Encounter: Payer: Self-pay | Admitting: Pharmacist

## 2021-10-27 ENCOUNTER — Ambulatory Visit (INDEPENDENT_AMBULATORY_CARE_PROVIDER_SITE_OTHER): Payer: Medicaid Other | Admitting: Urology

## 2021-10-27 ENCOUNTER — Ambulatory Visit: Payer: Medicaid Other | Attending: Family | Admitting: Family

## 2021-10-27 VITALS — BP 131/89 | HR 96 | Resp 18 | Ht 77.0 in | Wt 399.5 lb

## 2021-10-27 VITALS — BP 108/78 | HR 160 | Ht 77.0 in | Wt 399.0 lb

## 2021-10-27 DIAGNOSIS — I110 Hypertensive heart disease with heart failure: Secondary | ICD-10-CM | POA: Diagnosis not present

## 2021-10-27 DIAGNOSIS — I4891 Unspecified atrial fibrillation: Secondary | ICD-10-CM | POA: Diagnosis not present

## 2021-10-27 DIAGNOSIS — Z8673 Personal history of transient ischemic attack (TIA), and cerebral infarction without residual deficits: Secondary | ICD-10-CM | POA: Diagnosis not present

## 2021-10-27 DIAGNOSIS — R079 Chest pain, unspecified: Secondary | ICD-10-CM

## 2021-10-27 DIAGNOSIS — I10 Essential (primary) hypertension: Secondary | ICD-10-CM

## 2021-10-27 DIAGNOSIS — I5022 Chronic systolic (congestive) heart failure: Secondary | ICD-10-CM | POA: Diagnosis present

## 2021-10-27 DIAGNOSIS — F259 Schizoaffective disorder, unspecified: Secondary | ICD-10-CM | POA: Diagnosis not present

## 2021-10-27 DIAGNOSIS — G4730 Sleep apnea, unspecified: Secondary | ICD-10-CM | POA: Diagnosis not present

## 2021-10-27 DIAGNOSIS — Z87891 Personal history of nicotine dependence: Secondary | ICD-10-CM | POA: Insufficient documentation

## 2021-10-27 DIAGNOSIS — I4811 Longstanding persistent atrial fibrillation: Secondary | ICD-10-CM | POA: Diagnosis not present

## 2021-10-27 DIAGNOSIS — I509 Heart failure, unspecified: Secondary | ICD-10-CM | POA: Diagnosis not present

## 2021-10-27 DIAGNOSIS — I4819 Other persistent atrial fibrillation: Secondary | ICD-10-CM | POA: Diagnosis not present

## 2021-10-27 DIAGNOSIS — Z9981 Dependence on supplemental oxygen: Secondary | ICD-10-CM | POA: Insufficient documentation

## 2021-10-27 DIAGNOSIS — M25562 Pain in left knee: Secondary | ICD-10-CM | POA: Diagnosis not present

## 2021-10-27 DIAGNOSIS — Z7901 Long term (current) use of anticoagulants: Secondary | ICD-10-CM | POA: Insufficient documentation

## 2021-10-27 DIAGNOSIS — E119 Type 2 diabetes mellitus without complications: Secondary | ICD-10-CM | POA: Insufficient documentation

## 2021-10-27 DIAGNOSIS — R0789 Other chest pain: Secondary | ICD-10-CM | POA: Diagnosis present

## 2021-10-27 DIAGNOSIS — E785 Hyperlipidemia, unspecified: Secondary | ICD-10-CM | POA: Diagnosis not present

## 2021-10-27 DIAGNOSIS — Z79899 Other long term (current) drug therapy: Secondary | ICD-10-CM | POA: Diagnosis not present

## 2021-10-27 DIAGNOSIS — N99111 Postprocedural bulbous urethral stricture: Secondary | ICD-10-CM

## 2021-10-27 DIAGNOSIS — F250 Schizoaffective disorder, bipolar type: Secondary | ICD-10-CM | POA: Diagnosis not present

## 2021-10-27 DIAGNOSIS — N35812 Other urethral bulbous stricture, male: Secondary | ICD-10-CM

## 2021-10-27 LAB — CBC
HCT: 38.1 % — ABNORMAL LOW (ref 39.0–52.0)
Hemoglobin: 12.4 g/dL — ABNORMAL LOW (ref 13.0–17.0)
MCH: 28.9 pg (ref 26.0–34.0)
MCHC: 32.5 g/dL (ref 30.0–36.0)
MCV: 88.8 fL (ref 80.0–100.0)
Platelets: 151 10*3/uL (ref 150–400)
RBC: 4.29 MIL/uL (ref 4.22–5.81)
RDW: 14.6 % (ref 11.5–15.5)
WBC: 5.9 10*3/uL (ref 4.0–10.5)
nRBC: 0 % (ref 0.0–0.2)

## 2021-10-27 LAB — BASIC METABOLIC PANEL
Anion gap: 7 (ref 5–15)
BUN: 20 mg/dL (ref 6–20)
CO2: 30 mmol/L (ref 22–32)
Calcium: 8.9 mg/dL (ref 8.9–10.3)
Chloride: 102 mmol/L (ref 98–111)
Creatinine, Ser: 1.03 mg/dL (ref 0.61–1.24)
GFR, Estimated: >60 mL/min (ref >60)
Glucose, Bld: 133 mg/dL — ABNORMAL HIGH (ref 70–99)
Potassium: 3.9 mmol/L (ref 3.5–5.1)
Sodium: 139 mmol/L (ref 135–145)

## 2021-10-27 LAB — TROPONIN I (HIGH SENSITIVITY): Troponin I (High Sensitivity): 4 ng/L (ref <18)

## 2021-10-27 LAB — BLADDER SCAN AMB NON-IMAGING: Scan Result: 0

## 2021-10-27 MED ORDER — ACETAMINOPHEN 325 MG PO TABS
650.0000 mg | ORAL_TABLET | Freq: Once | ORAL | Status: AC
  Administered 2021-10-27: 650 mg via ORAL
  Filled 2021-10-27: qty 2

## 2021-10-27 MED ORDER — DILTIAZEM HCL 60 MG PO TABS
60.0000 mg | ORAL_TABLET | Freq: Four times a day (QID) | ORAL | Status: DC
  Administered 2021-10-27: 60 mg via ORAL
  Filled 2021-10-27: qty 1

## 2021-10-27 MED ORDER — SACUBITRIL-VALSARTAN 24-26 MG PO TABS: 1.0000 | ORAL_TABLET | Freq: Two times a day (BID) | ORAL | 3 refills | Status: DC

## 2021-10-27 MED ORDER — METOPROLOL TARTRATE 50 MG PO TABS
50.0000 mg | ORAL_TABLET | Freq: Two times a day (BID) | ORAL | Status: DC
  Administered 2021-10-27: 50 mg via ORAL
  Filled 2021-10-27: qty 1

## 2021-10-27 MED ORDER — SODIUM CHLORIDE 0.9 % IV BOLUS
500.0000 mL | Freq: Once | INTRAVENOUS | Status: AC
  Administered 2021-10-27: 500 mL via INTRAVENOUS

## 2021-10-27 MED ORDER — EMPAGLIFLOZIN 10 MG PO TABS: 10.0000 mg | ORAL_TABLET | Freq: Every day | ORAL | 0 refills | Status: DC

## 2021-10-27 MED ORDER — DILTIAZEM HCL 25 MG/5ML IV SOLN
10.0000 mg | Freq: Once | INTRAVENOUS | Status: AC
  Administered 2021-10-27: 10 mg via INTRAVENOUS
  Filled 2021-10-27: qty 5

## 2021-10-27 MED ORDER — DILTIAZEM HCL 25 MG/5ML IV SOLN
15.0000 mg | Freq: Once | INTRAVENOUS | Status: AC
  Administered 2021-10-27: 15 mg via INTRAVENOUS

## 2021-10-27 MED ORDER — EMPAGLIFLOZIN 10 MG PO TABS: 10.0000 mg | ORAL_TABLET | Freq: Every day | ORAL | 5 refills | Status: DC

## 2021-10-27 MED ORDER — DIVALPROEX SODIUM 500 MG PO DR TAB
1500.0000 mg | DELAYED_RELEASE_TABLET | Freq: Two times a day (BID) | ORAL | Status: DC
  Administered 2021-10-27: 1500 mg via ORAL
  Filled 2021-10-27: qty 3

## 2021-10-27 NOTE — Progress Notes (Signed)
[facility] - PHARMACIST COUNSELING NOTE  *HFpEF 45-50%  Guideline-Directed Medical Therapy/Evidence Based Medicine  ACE/ARB/ARNI: N/A Beta Blocker: Metoprolol tartrate 100 mg twice daily Aldosterone Antagonist:  none Diuretic: Furosemide 20 mg daily SGLT2i:  none  Adherence Assessment  Do you ever forget to take your medication? [] Yes [x] No  Do you ever skip doses due to side effects? [] Yes [x] No  Do you have trouble affording your medicines? [] Yes [x] No  Are you ever unable to pick up your medication due to transportation difficulties? [] Yes [x] No  Do you ever stop taking your medications because you don't believe they are helping? [] Yes [x] No  Do you check your weight daily? [] Yes [x] No   Adherence strategy: lives in group home  Barriers to obtaining medications: none  Vital signs: HR 96, BP 131/89, weight (pounds) 399 lb ECHO: Date 10/22/2021, EF 45-50%, notes LV hypokinesis     Latest Ref Rng & Units 10/21/2021   11:30 AM 10/16/2021    9:23 PM 10/16/2021    5:49 AM  BMP  Glucose 70 - 99 mg/dL 161122   096117   99    BUN 6 - 20 mg/dL 20   13   14     Creatinine 0.61 - 1.24 mg/dL 0.450.91   4.091.05   8.110.91    Sodium 135 - 145 mmol/L 137   138   138    Potassium 3.5 - 5.1 mmol/L 4.1   4.1   4.5    Chloride 98 - 111 mmol/L 95   94   96    CO2 22 - 32 mmol/L 32   35   34    Calcium 8.9 - 10.3 mg/dL 9.2   9.0   9.0      Past Medical History:  Diagnosis Date   Atrial fibrillation (HCC)    Cardiomyopathy (HCC)    a. 07/2021 Echo: EF 50%, mild LVH, nl RV size/function. No significant valvular disease.   Chest pain    CHF (congestive heart failure) (HCC)    Conversion disorder    Current use of long term anticoagulation    Diabetes mellitus without complication (HCC)    Hyperlipidemia    Hypertension    OSA (obstructive sleep apnea)    Persistent atrial fibrillation and flutter (HCC)    a. CHA2DS2VASc = 3-4   Schizoaffective disorder  (HCC)    Seizure disorder (HCC)    TIA (transient ischemic attack)    Urethral stricture    a. 08/2021 s/p cystoscopy and urethral dilation.    ASSESSMENT 31 year old male who presents to the HF clinic for follow up. PMH includes DM, hyperlipidemia, HTN, atrial fibrillation, conversion disorder, sleep apnea, schizoaffective disorder, seizure, TIA, tobacco use and chronic heart failure. Noted multiple ED visits. Last visit was for low back pain.   Appropriate to consider SGLT2i for this patient with A1C 6.1 and HF, but noted issues with recurrent UTI and urinary retention. Patient still complaining of urinary symptoms. Will recommend against initiating SGLT2i today. Patient also in multiple NSAIDs and more than one diuretic.   PLAN  Start Entresto for HF and mild LVH Defer SGLT2i at this time due to recurrent UTI, urinary retention, and recent urinary symptoms. Will reassess after follow up with urology. Consider dc HCTZ if able to tolerate Entresto and furosemide. DC ibuprofen and change Naproxen to Prn use to improveBP control and decrease fluid retention.  May increase diclofenac gel dose to 4gm QID if  using in lower extremities.   Time spent: 20 minutes  Ciaira Natividad Rodriguez-Guzman PharmD, BCPS 10/27/2021 10:50 AM   Current Outpatient Medications:    albuterol (VENTOLIN HFA) 108 (90 Base) MCG/ACT inhaler, Inhale 2 puffs into the lungs every 6 (six) hours as needed for wheezing., Disp: , Rfl:    allopurinol (ZYLOPRIM) 300 MG tablet, Take 1 tablet (300 mg total) by mouth daily., Disp: 30 tablet, Rfl: 0   aspirin 325 MG tablet, Take 325 mg by mouth daily., Disp: , Rfl:    atorvastatin (LIPITOR) 80 MG tablet, Take 80 mg by mouth daily., Disp: , Rfl:    baclofen (LIORESAL) 10 MG tablet, Take 1 tablet (10 mg total) by mouth 2 (two) times daily as needed for muscle spasms. Home med., Disp: 30 each, Rfl: 0   Cranberry-Vitamin C (AZO CRANBERRY URINARY TRACT PO), Take 1 tablet by mouth 3 (three)  times daily as needed., Disp: , Rfl:    cyclobenzaprine (FLEXERIL) 5 MG tablet, Take 5 mg by mouth 3 (three) times daily as needed for muscle spasms., Disp: , Rfl:    diclofenac Sodium (VOLTAREN) 1 % GEL, Apply 2 g topically 4 (four) times daily., Disp: , Rfl:    diltiazem (CARDIZEM CD) 360 MG 24 hr capsule, Take 1 capsule (360 mg total) by mouth daily., Disp: 30 capsule, Rfl: 1   divalproex (DEPAKOTE) 500 MG DR tablet, Take 2 tablets (1,000 mg total) by mouth 2 (two) times daily. (Patient taking differently: Take 1,500 mg by mouth 2 (two) times daily.), Disp: , Rfl:    ELIQUIS 5 MG TABS tablet, Take 5 mg by mouth 2 (two) times daily., Disp: , Rfl:    escitalopram (LEXAPRO) 10 MG tablet, Take 20 mg by mouth daily., Disp: , Rfl:    fluticasone (FLONASE) 50 MCG/ACT nasal spray, Place 2 sprays into both nostrils 2 (two) times daily., Disp: , Rfl:    furosemide (LASIX) 20 MG tablet, Take 20 mg by mouth daily., Disp: , Rfl:    hydrochlorothiazide (HYDRODIURIL) 25 MG tablet, Take 25 mg by mouth daily., Disp: , Rfl:    HYDROcodone-acetaminophen (NORCO/VICODIN) 5-325 MG tablet, Take 1 tablet by mouth every 4 (four) hours as needed., Disp: 15 tablet, Rfl: 0   hydrOXYzine (ATARAX) 25 MG tablet, Take 25 mg by mouth daily. At noon, Disp: , Rfl:    hydrOXYzine (ATARAX) 50 MG tablet, Take 50 mg by mouth in the morning and at bedtime., Disp: , Rfl:    ibuprofen (ADVIL) 800 MG tablet, Take 800 mg by mouth every 8 (eight) hours as needed., Disp: , Rfl:    INVEGA 9 MG 24 hr tablet, Take 9 mg by mouth every morning., Disp: , Rfl:    loratadine (CLARITIN) 10 MG tablet, Take 10 mg by mouth daily., Disp: , Rfl:    melatonin 3 MG TABS tablet, Take 3 mg by mouth at bedtime., Disp: , Rfl:    metoprolol tartrate (LOPRESSOR) 50 MG tablet, Take 100 mg by mouth 2 (two) times daily., Disp: , Rfl:    naproxen (NAPROSYN) 500 MG tablet, Take 1 tablet (500 mg total) by mouth 2 (two) times daily with a meal., Disp: 20 tablet, Rfl:  2   omeprazole (PRILOSEC) 20 MG capsule, Take 20 mg by mouth daily., Disp: , Rfl:    ondansetron (ZOFRAN-ODT) 4 MG disintegrating tablet, Take 1 tablet (4 mg total) by mouth every 6 (six) hours as needed for nausea or vomiting., Disp: 20 tablet, Rfl: 0   promethazine (  PHENERGAN) 25 MG tablet, Take 25 mg by mouth every 6 (six) hours as needed for nausea or vomiting., Disp: , Rfl:    sacubitril-valsartan (ENTRESTO) 24-26 MG, Take 1 tablet by mouth 2 (two) times daily., Disp: 60 tablet, Rfl: 3   tamsulosin (FLOMAX) 0.4 MG CAPS capsule, Take 1 capsule (0.4 mg total) by mouth daily., Disp: 30 capsule, Rfl: 2   traMADol (ULTRAM) 50 MG tablet, Take 50 mg by mouth every 6 (six) hours as needed., Disp: , Rfl:    MEDICATION ADHERENCES TIPS AND STRATEGIES Taking medication as prescribed improves patient outcomes in heart failure (reduces hospitalizations, improves symptoms, increases survival) Side effects of medications can be managed by decreasing doses, switching agents, stopping drugs, or adding additional therapy. Please let someone in the Heart Failure Clinic know if you have having bothersome side effects so we can modify your regimen. Do not alter your medication regimen without talking to [place].  Medication reminders can help patients remember to take drugs on time. If you are missing or forgetting doses you can try linking behaviors, using pill boxes, or an electronic reminder like an alarm on your phone or an app. Some people can also get automated phone calls as medication reminders.

## 2021-10-27 NOTE — ED Notes (Signed)
ED Provider at bedside. 

## 2021-10-27 NOTE — Progress Notes (Signed)
10/27/2021 10:23 AM   [name] 1990/08/31 [id number]  Referring provider: Anselm Jungling, NP [address] [address] [place],  [place] [address]  Chief Complaint  Patient presents with   Follow-up    HPI: 31 y.o. Daniel [name] presents for follow-up.  Refer to Dr. Keane Scrape recent inpatient progress notes Underwent cystoscopy under anesthesia 09/07/2021 with findings of a bulbar urethral stricture.  He underwent dilation with catheter placement He states he is voiding well and has no bothersome LUTS He does feel he voids better on tamsulosin and would like to continue   PMH: Past Medical History:  Diagnosis Date   Atrial fibrillation (HCC)    Cardiomyopathy (HCC)    a. 07/2021 Echo: EF 50%, mild LVH, nl RV size/function. No significant valvular disease.   Chest pain    CHF (congestive heart failure) (HCC)    Conversion disorder    Current use of long term anticoagulation    Diabetes mellitus without complication (HCC)    Hyperlipidemia    Hypertension    OSA (obstructive sleep apnea)    Persistent atrial fibrillation and flutter (HCC)    a. CHA2DS2VASc = 3-4   Schizoaffective disorder (HCC)    Seizure disorder (HCC)    TIA (transient ischemic attack)    Urethral stricture    a. 08/2021 s/p cystoscopy and urethral dilation.    Surgical History: Past Surgical History:  Procedure Laterality Date   CYSTOSCOPY WITH URETHRAL DILATATION N/A 09/07/2021   Procedure: CYSTOSCOPY WITH URETHRAL DILATATION CATHETER PLACEMENT;  Surgeon: Riki Altes, MD;  Location: [facility] ORS;  Service: Urology;  Laterality: N/A;    Home Medications:  Allergies as of 10/27/2021       Reactions   Haldol [haloperidol] Other (See Comments)   SI   Abilify [aripiprazole] Palpitations   Demerol [meperidine Hcl] Hives        Medication List        Accurate as of Oct 27, 2021 10:23 AM. If you have any questions, ask your nurse or doctor.          STOP taking these medications    cephALEXin 500 MG  capsule Commonly known as: KEFLEX Stopped by: Delma Freeze, FNP   oxybutynin 5 MG tablet Commonly known as: DITROPAN Stopped by: Delma Freeze, FNP   phenazopyridine 100 MG tablet Commonly known as: PYRIDIUM Stopped by: Delma Freeze, FNP       TAKE these medications    albuterol 108 (90 Base) MCG/ACT inhaler Commonly known as: VENTOLIN HFA Inhale 2 puffs into the lungs every 6 (six) hours as needed for wheezing.   allopurinol 300 MG tablet Commonly known as: ZYLOPRIM Take 1 tablet (300 mg total) by mouth daily.   aspirin 325 MG tablet Take 325 mg by mouth daily.   atorvastatin 80 MG tablet Commonly known as: LIPITOR Take 80 mg by mouth daily.   AZO CRANBERRY URINARY TRACT PO Take 1 tablet by mouth 3 (three) times daily as needed.   baclofen 10 MG tablet Commonly known as: LIORESAL Take 1 tablet (10 mg total) by mouth 2 (two) times daily as needed for muscle spasms. Home med.   cyclobenzaprine 5 MG tablet Commonly known as: FLEXERIL Take 5 mg by mouth 3 (three) times daily as needed for muscle spasms.   diclofenac Sodium 1 % Gel Commonly known as: VOLTAREN Apply 2 g topically 4 (four) times daily.   diltiazem 360 MG 24 hr capsule Commonly known as: CARDIZEM CD Take 1 capsule (360  mg total) by mouth daily.   divalproex 500 MG DR tablet Commonly known as: DEPAKOTE Take 2 tablets (1,000 mg total) by mouth 2 (two) times daily. What changed: how much to take   Eliquis 5 MG Tabs tablet Generic drug: apixaban Take 5 mg by mouth 2 (two) times daily.   escitalopram 10 MG tablet Commonly known as: LEXAPRO Take 20 mg by mouth daily.   fluticasone 50 MCG/ACT nasal spray Commonly known as: FLONASE Place 2 sprays into both nostrils 2 (two) times daily.   furosemide 20 MG tablet Commonly known as: LASIX Take 20 mg by mouth daily. What changed: Another medication with the same name was removed. Continue taking this medication, and follow the directions you  see here. Changed by: Delma Freezeina A Hackney, FNP   hydrochlorothiazide 25 MG tablet Commonly known as: HYDRODIURIL Take 25 mg by mouth daily.   HYDROcodone-acetaminophen 5-325 MG tablet Commonly known as: NORCO/VICODIN Take 1 tablet by mouth every 4 (four) hours as needed.   hydrOXYzine 50 MG tablet Commonly known as: ATARAX Take 50 mg by mouth in the morning and at bedtime.   hydrOXYzine 25 MG tablet Commonly known as: ATARAX Take 25 mg by mouth daily. At noon   ibuprofen 800 MG tablet Commonly known as: ADVIL Take 800 mg by mouth every 8 (eight) hours as needed.   Invega 9 MG 24 hr tablet Generic drug: paliperidone Take 9 mg by mouth every morning.   loratadine 10 MG tablet Commonly known as: CLARITIN Take 10 mg by mouth daily.   melatonin 3 MG Tabs tablet Take 3 mg by mouth at bedtime.   metoprolol tartrate 50 MG tablet Commonly known as: LOPRESSOR Take 100 mg by mouth 2 (two) times daily.   naproxen 500 MG tablet Commonly known as: Naprosyn Take 1 tablet (500 mg total) by mouth 2 (two) times daily with a meal.   omeprazole 20 MG capsule Commonly known as: PRILOSEC Take 20 mg by mouth daily.   ondansetron 4 MG disintegrating tablet Commonly known as: ZOFRAN-ODT Take 1 tablet (4 mg total) by mouth every 6 (six) hours as needed for nausea or vomiting.   promethazine 25 MG tablet Commonly known as: PHENERGAN Take 25 mg by mouth every 6 (six) hours as needed for nausea or vomiting.   sacubitril-valsartan 24-26 MG Commonly known as: ENTRESTO Take 1 tablet by mouth 2 (two) times daily. Started by: Delma Freezeina A Hackney, FNP   tamsulosin 0.4 MG Caps capsule Commonly known as: FLOMAX Take 1 capsule (0.4 mg total) by mouth daily.   traMADol 50 MG tablet Commonly known as: ULTRAM Take 50 mg by mouth every 6 (six) hours as needed.        Allergies:  Allergies  Allergen Reactions   Haldol [Haloperidol] Other (See Comments)    SI   Abilify [Aripiprazole]  Palpitations   Demerol [Meperidine Hcl] Hives    Family History: History reviewed. No pertinent family history.  Social History:  reports that he has quit smoking. His smoking use included cigarettes. He has never used smokeless tobacco. He reports that he does not currently use alcohol. He reports that he does not use drugs.   Physical Exam: BP 108/78   Pulse (!) 160   Ht 6\' 5"  (1.956 m)   Wt (!) 399 lb (181 kg)   BMI 47.31 kg/m   Constitutional:  Alert,, No acute distress. GU: Foley catheter with clear urine    Assessment & Plan:    1.  Bulbar urethral stricture status post dilation No bothersome LUTS PVR 0 mL PA follow-up 2 months for repeat PVR.  Instructed to call earlier for obstructive voiding symptoms   Riki Altes, MD  [facility] [address] [place], [place] [address] [phone]

## 2021-10-27 NOTE — Patient Instructions (Addendum)
Begin weighing daily and call for an overnight weight gain of 3 pounds or more or a weekly weight gain of more than 5 pounds.   If you have voicemail, please make sure your mailbox is cleaned out so that we may leave a message and please make sure to listen to any voicemails.    Start taking entresto as 1 tablet in the morning and 1 tablet in the evening

## 2021-10-27 NOTE — ED Provider Notes (Signed)
[facility] Provider Note    Event Date/Time   First MD Initiated Contact with Patient 10/27/21 2127     (approximate)   History   Chest Pain   HPI  Daniel [name] is a 31 y.o. male with a history of CHF as well as A-fib on anticoagulation and Cardizem presents to the ER for palpitations tachycardia some chest discomfort.  No hypoxia.  States has been compliant with his medications.  States symptoms feel consistent with previous episodes of A-fib.     Physical Exam   Triage Vital Signs: ED Triage Vitals  Enc Vitals Group     BP 10/27/21 2108 (!) 153/99     Pulse Rate 10/27/21 2108 (!) 132     Resp 10/27/21 2108 20     Temp 10/27/21 2108 98.4 F (36.9 C)     Temp Source 10/27/21 2108 Oral     SpO2 10/27/21 2108 99 %     Weight 10/27/21 2104 (!) 399 lb 0.5 oz (181 kg)     Height 10/27/21 2104 6\' 5"  (1.956 m)     Head Circumference --      Peak Flow --      Pain Score 10/27/21 2104 10     Pain Loc --      Pain Edu? --      Excl. in GC? --     Most recent vital signs: Vitals:   10/27/21 2130 10/27/21 2200  BP: (!) 111/47 121/74  Pulse:  (!) 109  Resp: 17 18  Temp:    SpO2:  93%     Constitutional: Alert  Eyes: Conjunctivae are normal.  Head: Atraumatic. Nose: No congestion/rhinnorhea. Mouth/Throat: Mucous membranes are moist.   Neck: Painless ROM.  Cardiovascular:   Good peripheral circulation. Irregularly irregular Respiratory: Normal respiratory effort.  No retractions. No wheeze or crackles Gastrointestinal: Soft and nontender.  Musculoskeletal:  no deformity Neurologic:  MAE spontaneously. No gross focal neurologic deficits are appreciated.  Skin:  Skin is warm, dry and intact. No rash noted. Psychiatric: Mood and affect are normal. Speech and behavior are normal.    ED Results / Procedures / Treatments   Labs (all labs ordered are listed, but only abnormal results are displayed) Labs Reviewed  BASIC METABOLIC PANEL  - Abnormal; Notable for the following components:      Result Value   Glucose, Bld 133 (*)    All other components within normal limits  CBC - Abnormal; Notable for the following components:   Hemoglobin 12.4 (*)    HCT 38.1 (*)    All other components within normal limits  TROPONIN I (HIGH SENSITIVITY)  TROPONIN I (HIGH SENSITIVITY)     EKG  ED ECG REPORT I, 10/29/21, the attending physician, personally viewed and interpreted this ECG.   Date: 10/27/2021  EKG Time: 21:06  Rate: 145  Rhythm: afib with rvr  Axis: normal  Intervals: normal qt  ST&T Change: no stemi, nonspecific st abn    RADIOLOGY Please see ED Course for my review and interpretation.  I personally reviewed all radiographic images ordered to evaluate for the above acute complaints and reviewed radiology reports and findings.  These findings were personally discussed with the patient.  Please see medical record for radiology report.    PROCEDURES:  Critical Care performed: Yes, see critical care procedure note(s)  .Critical Care Performed by: 10/29/2021, MD Authorized by: Willy Eddy, MD   Critical care provider statement:  Critical care time (minutes):  35   Critical care was necessary to treat or prevent imminent or life-threatening deterioration of the following conditions:  Circulatory failure   Critical care was time spent personally by me on the following activities:  Ordering and performing treatments and interventions, ordering and review of laboratory studies, ordering and review of radiographic studies, pulse oximetry, re-evaluation of patient's condition, review of old charts, obtaining history from patient or surrogate, examination of patient, evaluation of patient's response to treatment, discussions with primary provider, discussions with consultants and development of treatment plan with patient or surrogate   MEDICATIONS ORDERED IN ED: Medications  diltiazem  (CARDIZEM) tablet 60 mg (60 mg Oral Given 10/27/21 2139)  divalproex (DEPAKOTE) DR tablet 1,500 mg (1,500 mg Oral Given 10/27/21 2203)  acetaminophen (TYLENOL) tablet 650 mg (has no administration in time range)  metoprolol tartrate (LOPRESSOR) tablet 50 mg (has no administration in time range)  diltiazem (CARDIZEM) injection 10 mg (10 mg Intravenous Given 10/27/21 2139)  sodium chloride 0.9 % bolus 500 mL (500 mLs Intravenous New Bag/Given 10/27/21 2203)  diltiazem (CARDIZEM) injection 15 mg (15 mg Intravenous Given 10/27/21 2208)     IMPRESSION / MDM / ASSESSMENT AND PLAN / ED COURSE  I reviewed the triage vital signs and the nursing notes.                              Differential diagnosis includes, but is not limited to, A-fib with RVR, CHF, electrolyte abnormality, dehydration,  Patient presented to the ER for evaluation of symptoms as described above.  Patient is in A-fib with RVR.  This presenting complaint could reflect a potentially life-threatening illness therefore the patient will be placed on continuous pulse oximetry and telemetry for monitoring.  Laboratory evaluation will be sent to evaluate for the above complaints.  He is nontoxic-appearing roughly at his baseline but normotensive.  We will redose IV Cardizem as well as p.o.  Will observe for response will check blood work for the above differential have a lower suspicion for ACS.  Does not appear to be in volume overload.   Clinical Course as of 10/27/21 2314  Wed Oct 27, 2021  2217 Chest x-ray on my interpretation shows no evidence of edema.  Initial troponin negative.  Blood work reassuring.  Heart rate now rate controlled after Cardizem we will continue to observe. [PR]  2259 Rate is controlled still in the 90s. [PR]  2307 Patient reassessed.  In no acute distress.  No hypoxia.  Remains rate controlled.  Will be observed for repeat troponin to make sure he remains rate controlled patient will be signed out to oncoming  physician.  Anticipate discharge home if repeat troponin negative and remains rate controlled. [PR]    Clinical Course User Index [PR] Willy Eddy, MD      FINAL CLINICAL IMPRESSION(S) / ED DIAGNOSES   Final diagnoses:  Atrial fibrillation with RVR (HCC)     Rx / DC Orders   ED Discharge Orders     None        Note:  This document was prepared using Dragon voice recognition software and may include unintentional dictation errors.    Willy Eddy, MD 10/27/21 [phone]

## 2021-10-27 NOTE — ED Provider Notes (Signed)
-----------------------------------------   11:32 PM on 10/27/2021 -----------------------------------------  Blood pressure 121/74, pulse (!) 109, temperature 98.4 F (36.9 C), temperature source Oral, resp. rate 18, height 6\' 5"  (1.956 m), weight (!) 181 kg, SpO2 93 %.  Assuming care from Dr. .  In short, Daniel [name] is a 31 y.o. male with a chief complaint of Chest Pain .  Refer to the original H&P for additional details.  The current plan of care is to follow-up repeat troponin and monitor for rate control, if reassuring, patient appropriate for discharge home.  ----------------------------------------- 1:42 AM on 10/28/2021 ----------------------------------------- Repeat troponin within normal limits and heart rate remains well controlled on reassessment.  Patient is appropriate for discharge home with outpatient cardiology follow-up, was counseled to return to the ED for new worsening symptoms.  Patient agrees with plan.    12/28/2021, MD 10/28/21 [phone]

## 2021-10-27 NOTE — ED Triage Notes (Signed)
Pt presents via POV with caretaker with complaints of CP - Hx of afib. EKG obtained and showed afib RVR with rate of 143. Pt states the pain started this AM and got worse.

## 2021-10-27 NOTE — Progress Notes (Deleted)
Lab Results  Component Value Date   NA 137 10/21/2021   K 4.1 10/21/2021   CREATININE 0.91 10/21/2021   GFRNONAA >60 10/21/2021   GLUCOSE 122 (H) 10/21/2021   TSH 3.237 09/24/2021  Daniel [name] is a 31 y.o. male referred by Dr. [name] to Heart Failure clinic.  Heart Failure GDMT medications: Evidence-Based Beta blocker: ACEi/ARB:  ARNI:  MRA: SGLT2i: Hydralazine/Nitrates:   Intolerance:   Social History:   Diet:   Exercise:   Home BP readings:     Wt Readings from Last 3 Encounters:  10/27/21 (!) 399 lb (181 kg)  10/27/21 (!) 399 lb 8 oz (181.2 kg)  10/24/21 (!) 394 lb 9.6 oz (179 kg)   BP Readings from Last 3 Encounters:  10/27/21 108/78  10/27/21 131/89  10/24/21 111/69   Pulse Readings from Last 3 Encounters:  10/27/21 (!) 160  10/27/21 96  10/24/21 90    Estimated Creatinine Clearance: 209.4 mL/min (by C-G formula based on SCr of 0.91 mg/dL).  Lab Results  Component Value Date   NA 137 10/21/2021   K 4.1 10/21/2021   CREATININE 0.91 10/21/2021     Past Medical History:  Diagnosis Date   Atrial fibrillation ([place])    Cardiomyopathy ([place])    a. 07/2021 Echo: EF 50%, mild LVH, nl RV size/function. No significant valvular disease.   Chest pain    CHF (congestive heart failure) (HCC)    Conversion disorder    Current use of long term anticoagulation    Diabetes mellitus without complication (HCC)    Hyperlipidemia    Hypertension    OSA (obstructive sleep apnea)    Persistent atrial fibrillation and flutter ([place])    a. CHA2DS2VASc = 3-4   Schizoaffective disorder (HCC)    Seizure disorder (HCC)    TIA (transient ischemic attack)    Urethral stricture    a. 08/2021 s/p cystoscopy and urethral dilation.    Current Outpatient Medications on File Prior to Visit  Medication Sig Dispense Refill   albuterol (VENTOLIN HFA) 108 (90 Base) MCG/ACT inhaler Inhale 2 puffs into the lungs every 6 (six) hours as needed for wheezing.     allopurinol  (ZYLOPRIM) 300 MG tablet Take 1 tablet (300 mg total) by mouth daily. 30 tablet 0   aspirin 325 MG tablet Take 325 mg by mouth daily.     atorvastatin (LIPITOR) 80 MG tablet Take 80 mg by mouth daily.     baclofen (LIORESAL) 10 MG tablet Take 1 tablet (10 mg total) by mouth 2 (two) times daily as needed for muscle spasms. Home med. 30 each 0   Cranberry-Vitamin C (AZO CRANBERRY URINARY TRACT PO) Take 1 tablet by mouth 3 (three) times daily as needed.     cyclobenzaprine (FLEXERIL) 5 MG tablet Take 5 mg by mouth 3 (three) times daily as needed for muscle spasms.     diclofenac Sodium (VOLTAREN) 1 % GEL Apply 2 g topically 4 (four) times daily.     diltiazem (CARDIZEM CD) 360 MG 24 hr capsule Take 1 capsule (360 mg total) by mouth daily. 30 capsule 1   divalproex (DEPAKOTE) 500 MG DR tablet Take 2 tablets (1,000 mg total) by mouth 2 (two) times daily. (Patient taking differently: Take 1,500 mg by mouth 2 (two) times daily.)     ELIQUIS 5 MG TABS tablet Take 5 mg by mouth 2 (two) times daily.     escitalopram (LEXAPRO) 10 MG tablet Take 20 mg by mouth daily.  fluticasone (FLONASE) 50 MCG/ACT nasal spray Place 2 sprays into both nostrils 2 (two) times daily.     furosemide (LASIX) 20 MG tablet Take 20 mg by mouth daily.     hydrochlorothiazide (HYDRODIURIL) 25 MG tablet Take 25 mg by mouth daily.     HYDROcodone-acetaminophen (NORCO/VICODIN) 5-325 MG tablet Take 1 tablet by mouth every 4 (four) hours as needed. 15 tablet 0   hydrOXYzine (ATARAX) 25 MG tablet Take 25 mg by mouth daily. At noon     hydrOXYzine (ATARAX) 50 MG tablet Take 50 mg by mouth in the morning and at bedtime.     ibuprofen (ADVIL) 800 MG tablet Take 800 mg by mouth every 8 (eight) hours as needed.     INVEGA 9 MG 24 hr tablet Take 9 mg by mouth every morning.     loratadine (CLARITIN) 10 MG tablet Take 10 mg by mouth daily.     melatonin 3 MG TABS tablet Take 3 mg by mouth at bedtime.     metoprolol tartrate (LOPRESSOR) 50 MG  tablet Take 100 mg by mouth 2 (two) times daily.     naproxen (NAPROSYN) 500 MG tablet Take 1 tablet (500 mg total) by mouth 2 (two) times daily with a meal. 20 tablet 2   omeprazole (PRILOSEC) 20 MG capsule Take 20 mg by mouth daily.     ondansetron (ZOFRAN-ODT) 4 MG disintegrating tablet Take 1 tablet (4 mg total) by mouth every 6 (six) hours as needed for nausea or vomiting. 20 tablet 0   promethazine (PHENERGAN) 25 MG tablet Take 25 mg by mouth every 6 (six) hours as needed for nausea or vomiting.     sacubitril-valsartan (ENTRESTO) 24-26 MG Take 1 tablet by mouth 2 (two) times daily. 60 tablet 3   tamsulosin (FLOMAX) 0.4 MG CAPS capsule Take 1 capsule (0.4 mg total) by mouth daily. 30 capsule 2   traMADol (ULTRAM) 50 MG tablet Take 50 mg by mouth every 6 (six) hours as needed.     No current facility-administered medications on file prior to visit.    Allergies  Allergen Reactions   Haldol [Haloperidol] Other (See Comments)    SI   Abilify [Aripiprazole] Palpitations   Demerol [Meperidine Hcl] Hives    There were no vitals taken for this visit.  Date of Prior Authorization  No problem-specific Assessment & Plan notes found for this encounter.

## 2021-10-28 LAB — TROPONIN I (HIGH SENSITIVITY): Troponin I (High Sensitivity): 4 ng/L (ref <18)

## 2021-10-28 NOTE — ED Notes (Signed)
Called Mr Clayburn Pert at [phone], will come to pick up this patient

## 2021-10-29 ENCOUNTER — Emergency Department: Payer: Medicaid Other

## 2021-10-29 ENCOUNTER — Other Ambulatory Visit: Payer: Self-pay

## 2021-10-29 ENCOUNTER — Encounter: Payer: Self-pay | Admitting: Emergency Medicine

## 2021-10-29 ENCOUNTER — Emergency Department
Admission: EM | Admit: 2021-10-29 | Discharge: 2021-10-29 | Disposition: A | Discharge status: Home / Self Care | Payer: Medicaid Other | Attending: Emergency Medicine | Admitting: Emergency Medicine

## 2021-10-29 DIAGNOSIS — R635 Abnormal weight gain: Secondary | ICD-10-CM | POA: Insufficient documentation

## 2021-10-29 DIAGNOSIS — M7989 Other specified soft tissue disorders: Secondary | ICD-10-CM | POA: Insufficient documentation

## 2021-10-29 DIAGNOSIS — I509 Heart failure, unspecified: Secondary | ICD-10-CM | POA: Insufficient documentation

## 2021-10-29 DIAGNOSIS — I482 Chronic atrial fibrillation, unspecified: Secondary | ICD-10-CM | POA: Insufficient documentation

## 2021-10-29 DIAGNOSIS — F449 Dissociative and conversion disorder, unspecified: Secondary | ICD-10-CM | POA: Insufficient documentation

## 2021-10-29 DIAGNOSIS — E119 Type 2 diabetes mellitus without complications: Secondary | ICD-10-CM | POA: Insufficient documentation

## 2021-10-29 DIAGNOSIS — R0602 Shortness of breath: Secondary | ICD-10-CM | POA: Diagnosis present

## 2021-10-29 LAB — CBC
HCT: 39.1 % (ref 39.0–52.0)
Hemoglobin: 12.8 g/dL — ABNORMAL LOW (ref 13.0–17.0)
MCH: 28.8 pg (ref 26.0–34.0)
MCHC: 32.7 g/dL (ref 30.0–36.0)
MCV: 87.9 fL (ref 80.0–100.0)
Platelets: 158 10*3/uL (ref 150–400)
RBC: 4.45 MIL/uL (ref 4.22–5.81)
RDW: 14.5 % (ref 11.5–15.5)
WBC: 5.9 10*3/uL (ref 4.0–10.5)
nRBC: 0.3 % — ABNORMAL HIGH (ref 0.0–0.2)

## 2021-10-29 LAB — BRAIN NATRIURETIC PEPTIDE: B Natriuretic Peptide: 243.6 pg/mL — ABNORMAL HIGH (ref 0.0–100.0)

## 2021-10-29 LAB — BASIC METABOLIC PANEL
Anion gap: 5 (ref 5–15)
BUN: 14 mg/dL (ref 6–20)
CO2: 32 mmol/L (ref 22–32)
Calcium: 9 mg/dL (ref 8.9–10.3)
Chloride: 101 mmol/L (ref 98–111)
Creatinine, Ser: 0.9 mg/dL (ref 0.61–1.24)
GFR, Estimated: >60 mL/min (ref >60)
Glucose, Bld: 102 mg/dL — ABNORMAL HIGH (ref 70–99)
Potassium: 4.4 mmol/L (ref 3.5–5.1)
Sodium: 138 mmol/L (ref 135–145)

## 2021-10-29 LAB — TROPONIN I (HIGH SENSITIVITY): Troponin I (High Sensitivity): 3 ng/L (ref <18)

## 2021-10-29 MED ORDER — FUROSEMIDE 10 MG/ML IJ SOLN: 40.0000 mg | Freq: Once | INTRAMUSCULAR | Status: DC

## 2021-10-29 MED ORDER — FUROSEMIDE 40 MG PO TABS
40.0000 mg | ORAL_TABLET | Freq: Once | ORAL | Status: AC
  Administered 2021-10-29: 40 mg via ORAL
  Filled 2021-10-29: qty 1

## 2021-10-29 MED ORDER — POTASSIUM CHLORIDE CRYS ER 20 MEQ PO TBCR: 20.0000 meq | EXTENDED_RELEASE_TABLET | Freq: Every day | ORAL | 0 refills | Status: DC

## 2021-10-29 MED ORDER — NAPROXEN 500 MG PO TABS
500.0000 mg | ORAL_TABLET | Freq: Once | ORAL | Status: AC
  Administered 2021-10-29: 500 mg via ORAL
  Filled 2021-10-29: qty 1

## 2021-10-29 NOTE — ED Notes (Addendum)
This RN spoke to [employer], pts legal guardian. Updated on plan of care and notified that pt is going to be discharged. Plans have been arranged for caregiver to pt up pt and bring back to facility. Legal guardian aware of all information.

## 2021-10-29 NOTE — ED Notes (Signed)
Careigver at bedside to bring pt back to facility

## 2021-10-29 NOTE — ED Triage Notes (Signed)
C?O fluid retention this morning.  STates weighed self this morning, and weight was 408 today.

## 2021-10-29 NOTE — ED Provider Notes (Signed)
[facility] Provider Note    Event Date/Time   First MD Initiated Contact with Patient 10/29/21 1707     (approximate)   History   Chief Complaint: Shortness of breath  HPI  Daniel [name] is a 31 y.o. male with a history of atrial fibrillation, CHF, schizoaffective disorder, diabetes, morbid obesity, conversion disorder who comes ED complaining of shortness of breath, increased leg swelling, increased weight.  Group home representative at bedside reports that patient is at his baseline weight and his symptoms are all chronic.  Patient's been compliant with medications.  No significant chest pain or cough or fever.     Physical Exam   Triage Vital Signs: ED Triage Vitals  Enc Vitals Group     BP 10/29/21 1558 132/82     Pulse Rate 10/29/21 1558 (!) 110     Resp 10/29/21 1558 16     Temp 10/29/21 1558 99.1 F (37.3 C)     Temp Source 10/29/21 1558 Oral     SpO2 10/29/21 1558 94 %     Weight --      Height --      Head Circumference --      Peak Flow --      Pain Score 10/29/21 1556 0     Pain Loc --      Pain Edu? --      Excl. in [place]? --     Most recent vital signs: Vitals:   10/29/21 1830 10/29/21 1900  BP: 123/63 126/79  Pulse: (!) 102 (!) 107  Resp: 14 15  Temp:    SpO2: 100% 100%    General: Awake, no distress.  CV:  Good peripheral perfusion.  Irregularly irregular rhythm, heart rate 100-1 10. Resp:  Normal effort.  Clear to auscultation bilateral Abd:  No distention.  Soft nontender Other:  Trace pitting edema bilateral lower extremities   ED Results / Procedures / Treatments   Labs (all labs ordered are listed, but only abnormal results are displayed) Labs Reviewed  BASIC METABOLIC PANEL - Abnormal; Notable for the following components:      Result Value   Glucose, Bld 102 (*)    All other components within normal limits  CBC - Abnormal; Notable for the following components:   Hemoglobin 12.8 (*)    nRBC 0.3 (*)     All other components within normal limits  BRAIN NATRIURETIC PEPTIDE - Abnormal; Notable for the following components:   B Natriuretic Peptide 243.6 (*)    All other components within normal limits  TROPONIN I (HIGH SENSITIVITY)     EKG Interpreted by me Atrial fibrillation, rate of 118.  Normal axis, normal intervals.  Normal QRS ST segments and T waves.  No ischemic changes.   RADIOLOGY Chest x-ray viewed and interpreted by me, appears normal.  No pulmonary edema.  Radiology report reviewed.   PROCEDURES:  Procedures   MEDICATIONS ORDERED IN ED: Medications  furosemide (LASIX) tablet 40 mg (40 mg Oral Given 10/29/21 1948)  naproxen (NAPROSYN) tablet 500 mg (500 mg Oral Given 10/29/21 1948)     IMPRESSION / MDM / ASSESSMENT AND PLAN / ED COURSE  I reviewed the triage vital signs and the nursing notes.                              Differential diagnosis includes, but is not limited to, CHF exacerbation, pneumonia, pleural effusion, pulmonary  edema, non-STEMI, conversion disorder,  Patient's presentation is most consistent with acute presentation with potential threat to life or bodily function.  ***       FINAL CLINICAL IMPRESSION(S) / ED DIAGNOSES   Final diagnoses:  Chronic congestive heart failure, unspecified heart failure type (HCC)  Chronic atrial fibrillation (HCC)  Conversion disorder     Rx / DC Orders   ED Discharge Orders          Ordered    potassium chloride SA (KLOR-CON M) 20 MEQ tablet  Daily        10/29/21 1947             Note:  This document was prepared using Dragon voice recognition software and may include unintentional dictation errors.

## 2021-10-29 NOTE — Discharge Instructions (Addendum)
Continue taking all of your medications as prescribed.  Increase your Lasix to 40 mg once a day for the next 3 days, and take a potassium supplement daily as well.  Follow-up with heart failure clinic next week for further evaluation of your symptoms.

## 2021-10-29 NOTE — ED Notes (Signed)
Attempt to draw labs by EDT x 2 without success, lab called

## 2021-10-29 NOTE — ED Notes (Signed)
Pt discharge information reviewed. Pt understands need for follow up care and when to return if symptoms worsen. All questions answered. Pt is alert and oriented with even and regular respirations. Pt is seen ambulating out of department with string steady gait with caregiver.

## 2021-10-29 NOTE — ED Provider Triage Note (Signed)
Emergency Medicine Provider Triage Evaluation Note  Daniel [name], a 31 y.o. male  was evaluated in triage.  Pt complains of weight gain, peripheral edema and SOB.  Review of Systems  Positive: SOB, edema Negative: CP  Physical Exam  There were no vitals taken for this visit. Gen:   Awake, no distress   Resp:  Normal effort CTA. Normal 4L per [place]  MSK:   Moves extremities without difficulty Pitting edema noted bilaterally.  Other:    Medical Decision Making  Medically screening exam initiated at 3:57 PM.  Appropriate orders placed.  Daniel [name] was informed that the remainder of the evaluation will be completed by another provider, this initial triage assessment does not replace that evaluation, and the importance of remaining in the ED until their evaluation is complete.  Patient with a history of CHF, a fib, and DM presents with complaints of increased weight and SOB.    Daniel Hoard, PA-C 10/29/21 1600

## 2021-10-30 NOTE — Progress Notes (Deleted)
Patient ID: Daniel [name], male    DOB: 1990-11-27, 31 y.o.   MRN: [id number]  HPI  Daniel [name] is a 31 y/o male with a history of DM, hyperlipidemia, HTN, atrial fibrillation, conversion disorder, sleep apnea, schizoaffective disorder, seizure, TIA, tobacco use and chronic heart failure.   Echo report from 10/22/21 reviewed and showed an EF of 45-50%. Echo report from 08/04/21 reviewed and showed an EF of 50% along with mild LVH  Was in the ED 10/29/21, 10/27/21, 10/24/21 and also 10/21/21. Was in the ED 10/16/21 due to chest pain and c/o current caregiver. Found to be in AF RVR. Given IV metoprolol along with IV lasix. Transitioned to oral medications. Head/cervical CT done because he says that he had a previous fall. Evaluated and subsequently released. Has had 7 other ED visits the month of May. In April had 9 ED visits along with 1 admission. Had 4 ED visits and 3 admissions in March.   He presents today for a follow-up visit with a chief complaint of    Past Medical History:  Diagnosis Date   Atrial fibrillation (HCC)    Cardiomyopathy (HCC)    a. 07/2021 Echo: EF 50%, mild LVH, nl RV size/function. No significant valvular disease.   Chest pain    CHF (congestive heart failure) (HCC)    Conversion disorder    Current use of long term anticoagulation    Diabetes mellitus without complication (HCC)    Hyperlipidemia    Hypertension    OSA (obstructive sleep apnea)    Persistent atrial fibrillation and flutter (HCC)    a. CHA2DS2VASc = 3-4   Schizoaffective disorder (HCC)    Seizure disorder (HCC)    TIA (transient ischemic attack)    Urethral stricture    a. 08/2021 s/p cystoscopy and urethral dilation.   Past Surgical History:  Procedure Laterality Date   CYSTOSCOPY WITH URETHRAL DILATATION N/A 09/07/2021   Procedure: CYSTOSCOPY WITH URETHRAL DILATATION CATHETER PLACEMENT;  Surgeon: Riki Altes, MD;  Location: [facility] ORS;  Service: Urology;  Laterality: N/A;   No family history  on file. Social History   Tobacco Use   Smoking status: Former    Types: Cigarettes   Smokeless tobacco: Never  Substance Use Topics   Alcohol use: Not Currently   Allergies  Allergen Reactions   Haldol [Haloperidol] Other (See Comments)    SI   Abilify [Aripiprazole] Palpitations   Demerol [Meperidine Hcl] Hives    Review of Systems  Constitutional:  Positive for fatigue (easily). Negative for appetite change.  HENT:  Positive for congestion and rhinorrhea. Negative for sore throat.   Eyes: Negative.   Respiratory:  Positive for apnea, cough, shortness of breath and wheezing.        + snoring  Cardiovascular:  Positive for chest pain (at times), palpitations (at times) and leg swelling.  Gastrointestinal:  Positive for vomiting. Negative for abdominal distention and abdominal pain.  Endocrine: Negative.   Genitourinary:  Negative for dysuria.       Has shaking in his left arm when urinating  Musculoskeletal:  Positive for arthralgias (left knee). Negative for back pain and neck pain.  Skin: Negative.   Allergic/Immunologic: Negative.   Neurological:  Positive for tremors (when urinating) and light-headedness. Negative for dizziness.  Hematological:  Negative for adenopathy. Does not bruise/bleed easily.  Psychiatric/Behavioral:  Positive for sleep disturbance (sleeping on 3 pillows). Negative for dysphoric mood. The patient is nervous/anxious.       Physical  Exam Vitals and nursing note reviewed.  Constitutional:      Appearance: Normal appearance.  HENT:     Head: Normocephalic and atraumatic.  Cardiovascular:     Rate and Rhythm: Normal rate. Rhythm irregular.  Pulmonary:     Effort: Pulmonary effort is normal. No respiratory distress.     Breath sounds: Rhonchi (RLL) present. No wheezing or rales.  Abdominal:     General: There is no distension.     Palpations: Abdomen is soft.  Musculoskeletal:        General: No tenderness.     Cervical back: Normal range  of motion.     Right lower leg: Edema (trace pitting) present.     Left lower leg: Edema (trace pitting) present.  Skin:    General: Skin is warm and dry.  Neurological:     Mental Status: He is alert and oriented to person, place, and time. Mental status is at baseline.  Psychiatric:        Mood and Affect: Mood is anxious.        Behavior: Behavior normal.        Thought Content: Thought content normal.   Assessment & Plan:  1: Chronic heart failure with mildly reduced ejection fraction- - NYHA class III - euvolemic today - not weighing as he says the scales that were provided "aren't working"; advised caregiver to get him a scale so that he can weigh daily and call for an overnight weight gain of > 2 pounds or a weekly weight gain of > 5 pounds - weight 399.8 pounds from last visit here 5 days ago - difficult for his sodium intake to be managed because he eats conveniently prepared foods to eat; encouraged to at least not add any salt to his food  - started entresto 24/26mg  BID at last visit - consider adding SGLT2 but he is seeing urology later today so will defer - prescription written for rolling walker due to his left knee pain, SOB and fatigue - BNP 10/29/21 was 243.6  2: HTN- - BP - sees PCP at the group home but mentions wanting to get a different PCP; explained that he would need to discuss this with the group home as the PCP may have to be the provider that oversees the home - BMP 10/29/21 reviewed and showed sodium 138, potassium 4.4, creatinine 0.9 and GFR >60  3: Persistent atrial fibrillation- - saw cardiology Brion Aliment) 10/07/21 - HR irregular today  4: Schizophrenia- - on invega daily  5: Sleep apnea- - caregiver says that he has a sleep evaluation in June - wearing oxygen at 4L around the clock when at the group home - prescription provided for [employer] portable oxygen to see if he can obtain that so he can wear it when out to appointments   Facility medication  list reviewed.

## 2021-11-01 ENCOUNTER — Emergency Department
Admission: EM | Admit: 2021-11-01 | Discharge: 2021-11-01 | Disposition: A | Discharge status: Home / Self Care | Payer: Medicaid Other | Attending: Emergency Medicine | Admitting: Emergency Medicine

## 2021-11-01 ENCOUNTER — Telehealth: Payer: Self-pay | Admitting: Internal Medicine

## 2021-11-01 ENCOUNTER — Ambulatory Visit: Payer: Medicaid Other | Attending: Family | Admitting: Family

## 2021-11-01 ENCOUNTER — Other Ambulatory Visit: Payer: Self-pay

## 2021-11-01 ENCOUNTER — Ambulatory Visit: Payer: Medicaid Other | Admitting: Family

## 2021-11-01 ENCOUNTER — Encounter: Payer: Self-pay | Admitting: Family

## 2021-11-01 ENCOUNTER — Encounter: Payer: Self-pay | Admitting: Emergency Medicine

## 2021-11-01 VITALS — BP 132/51 | HR 96 | Resp 20 | Ht 77.0 in | Wt 389.0 lb

## 2021-11-01 DIAGNOSIS — F250 Schizoaffective disorder, bipolar type: Secondary | ICD-10-CM | POA: Diagnosis not present

## 2021-11-01 DIAGNOSIS — E119 Type 2 diabetes mellitus without complications: Secondary | ICD-10-CM | POA: Insufficient documentation

## 2021-11-01 DIAGNOSIS — I509 Heart failure, unspecified: Secondary | ICD-10-CM | POA: Diagnosis present

## 2021-11-01 DIAGNOSIS — I4811 Longstanding persistent atrial fibrillation: Secondary | ICD-10-CM | POA: Diagnosis not present

## 2021-11-01 DIAGNOSIS — I110 Hypertensive heart disease with heart failure: Secondary | ICD-10-CM | POA: Diagnosis not present

## 2021-11-01 DIAGNOSIS — I5022 Chronic systolic (congestive) heart failure: Secondary | ICD-10-CM | POA: Diagnosis not present

## 2021-11-01 DIAGNOSIS — I10 Essential (primary) hypertension: Secondary | ICD-10-CM

## 2021-11-01 DIAGNOSIS — R569 Unspecified convulsions: Secondary | ICD-10-CM | POA: Diagnosis not present

## 2021-11-01 DIAGNOSIS — E785 Hyperlipidemia, unspecified: Secondary | ICD-10-CM | POA: Diagnosis not present

## 2021-11-01 DIAGNOSIS — F259 Schizoaffective disorder, unspecified: Secondary | ICD-10-CM | POA: Insufficient documentation

## 2021-11-01 DIAGNOSIS — R300 Dysuria: Secondary | ICD-10-CM | POA: Insufficient documentation

## 2021-11-01 DIAGNOSIS — F17200 Nicotine dependence, unspecified, uncomplicated: Secondary | ICD-10-CM | POA: Insufficient documentation

## 2021-11-01 DIAGNOSIS — I4819 Other persistent atrial fibrillation: Secondary | ICD-10-CM | POA: Insufficient documentation

## 2021-11-01 DIAGNOSIS — R309 Painful micturition, unspecified: Secondary | ICD-10-CM | POA: Diagnosis not present

## 2021-11-01 DIAGNOSIS — Z8673 Personal history of transient ischemic attack (TIA), and cerebral infarction without residual deficits: Secondary | ICD-10-CM | POA: Insufficient documentation

## 2021-11-01 DIAGNOSIS — G4730 Sleep apnea, unspecified: Secondary | ICD-10-CM | POA: Diagnosis not present

## 2021-11-01 MED ORDER — CEPHALEXIN 500 MG PO CAPS: 500.0000 mg | ORAL_CAPSULE | Freq: Two times a day (BID) | ORAL | 0 refills | Status: DC

## 2021-11-01 NOTE — Telephone Encounter (Signed)
Patient came by office   Patient c/o Palpitations:  High priority if patient c/o lightheadedness, shortness of breath, or chest pain  How long have you had palpitations/irregular HR/ Afib? Are you having the symptoms now? 2 or 3 weeks  Are you currently experiencing lightheadedness, SOB or CP? Chest pain, lightheadedness, SOB   Do you have a history of afib (atrial fibrillation) or irregular heart rhythm? yes  Have you checked your BP or HR? (document readings if available): most recent 132/51  Are you experiencing any other symptoms? Dizziness   Has been to the ER today

## 2021-11-01 NOTE — ED Provider Notes (Signed)
   [facility] Provider Note    Event Date/Time   First MD Initiated Contact with Patient 11/01/21 1145     (approximate)   History   Back Pain and Dysuria   HPI  Kenniel Ostwald is a 31 y.o. male well-known to our department with history of atrial fibrillation, cardiomyopathy, conversion disorder, diabetes, hypertension, schizoaffective disorder who presents with complaints of dysuria.  Patient notes burning with urination over the last 2 days.  He reports he has had urinary tract infections in the past.  No fevers reported.     Physical Exam   Triage Vital Signs: ED Triage Vitals  Enc Vitals Group     BP 11/01/21 1143 (!) 142/102     Pulse Rate 11/01/21 1143 87     Resp 11/01/21 1143 20     Temp 11/01/21 1143 97.9 F (36.6 C)     Temp Source 11/01/21 1143 Oral     SpO2 11/01/21 1143 96 %     Weight 11/01/21 1140 (!) 181 kg (399 lb 0.5 oz)     Height 11/01/21 1140 1.956 m (6\' 5" )     Head Circumference --      Peak Flow --      Pain Score 11/01/21 1140 10     Pain Loc --      Pain Edu? --      Excl. in [place]? --     Most recent vital signs: Vitals:   11/01/21 1143  BP: (!) 142/102  Pulse: 87  Resp: 20  Temp: 97.9 F (36.6 C)  SpO2: 96%     General: Awake, no distress.  CV:  Good peripheral perfusion.  Irregular rhythm, normal rate Resp:  Normal effort.  Abd:  No distention.  No CVA tenderness Other:     ED Results / Procedures / Treatments   Labs (all labs ordered are listed, but only abnormal results are displayed) Labs Reviewed - No data to display   EKG     RADIOLOGY     PROCEDURES:  Critical Care performed:   Procedures   MEDICATIONS ORDERED IN ED: Medications - No data to display   IMPRESSION / MDM / [place] / ED COURSE  I reviewed the triage vital signs and the nursing notes. Patient's presentation is most consistent with acute, uncomplicated illness.  Patient with significant past  medical history as detailed above presents with complaints of dysuria.  Patient is well-known to our emergency department.  He does have a history of diabetes  I was able to review lab work done 3 days ago which demonstrated normal BMP, normal CBC, glucose has been well controlled  Given his symptoms consistent with UTI, will start the patient on Keflex, outpatient follow-up recommended.      FINAL CLINICAL IMPRESSION(S) / ED DIAGNOSES   Final diagnoses:  Dysuria     Rx / DC Orders   ED Discharge Orders          Ordered    cephALEXin (KEFLEX) 500 MG capsule  2 times daily        11/01/21 1146             Note:  This document was prepared using Dragon voice recognition software and may include unintentional dictation errors.   Lavonia Drafts, MD 11/01/21 1208

## 2021-11-01 NOTE — Progress Notes (Signed)
Patient ID: Daniel [name], male    DOB: 06-Sep-1990, 31 y.o.   MRN: [id number]  HPI  Mr Orvis is a 31 y/o male with a history of DM, hyperlipidemia, HTN, atrial fibrillation, conversion disorder, sleep apnea, schizoaffective disorder, seizure, TIA, tobacco use and chronic heart failure.   Echo report from 10/22/21 reviewed and showed an EF of 45-50%. Echo report from 08/04/21 reviewed and showed an EF of 50% along with mild LVH  Was in the ED earlier today, 10/29/21, 10/27/21, 10/24/21 and also 10/21/21. Was in the ED 10/16/21 due to chest pain and c/o current caregiver. Found to be in AF RVR. Given IV metoprolol along with IV lasix. Transitioned to oral medications. Head/cervical CT done because he says that he had a previous fall. Evaluated and subsequently released. Has had 7 other ED visits the month of May. In April had 9 ED visits along with 1 admission. Had 4 ED visits and 3 admissions in March.   He presents today for a follow-up visit with a chief complaint of moderate shortness of breath with minimal exertion. Describes this as chronic. He has associated fatigue, cough, wheezing, pedal edema, palpitations, light-headedness, tremors, chronic pain, anxiety, difficulty sleeping and intermittent blood int he stool. Denies any abdominal distention or weight gain.   Was in the ED earlier today but then left when he was told he had an appointment in the [facility]. Had presented to the ED due to dysuria. Caregiver that is present with him says that the caregiver that was with patient at last visit "took the medication samples" and he also hasn't gotten the RX from his pharmacy and caregiver present says that he has to look into this.   Patient says that we gave him jardiance and not entresto last week. Verified in sample log book that entresto 24/26 was provided at last visit.   Caregiver present says that patient recently admitted to [facility] at the facility that he makes up symptoms so that he can  go the ED. Caregiver goes out to the waiting room. Patient becomes slightly tearful saying that "something is wrong with my body and nobody will listen to me".   Past Medical History:  Diagnosis Date   Atrial fibrillation (HCC)    Cardiomyopathy (HCC)    a. 07/2021 Echo: EF 50%, mild LVH, nl RV size/function. No significant valvular disease.   Chest pain    CHF (congestive heart failure) (HCC)    Conversion disorder    Current use of long term anticoagulation    Diabetes mellitus without complication (HCC)    Hyperlipidemia    Hypertension    OSA (obstructive sleep apnea)    Persistent atrial fibrillation and flutter (HCC)    a. CHA2DS2VASc = 3-4   Schizoaffective disorder (HCC)    Seizure disorder (HCC)    TIA (transient ischemic attack)    Urethral stricture    a. 08/2021 s/p cystoscopy and urethral dilation.   Past Surgical History:  Procedure Laterality Date   CYSTOSCOPY WITH URETHRAL DILATATION N/A 09/07/2021   Procedure: CYSTOSCOPY WITH URETHRAL DILATATION CATHETER PLACEMENT;  Surgeon: Riki Altes, MD;  Location: [facility] ORS;  Service: Urology;  Laterality: N/A;   No family history on file. Social History   Tobacco Use   Smoking status: Former    Types: Cigarettes   Smokeless tobacco: Never  Substance Use Topics   Alcohol use: Not Currently   Allergies  Allergen Reactions   Haldol [Haloperidol] Other (See Comments)  SI   Abilify [Aripiprazole] Palpitations   Demerol [Meperidine Hcl] Hives   Prior to Admission medications   Medication Sig Start Date End Date Taking? Authorizing Provider  albuterol (VENTOLIN HFA) 108 (90 Base) MCG/ACT inhaler Inhale 2 puffs into the lungs every 6 (six) hours as needed for wheezing. 07/12/21  Yes [provider]  allopurinol (ZYLOPRIM) 300 MG tablet Take 1 tablet (300 mg total) by mouth daily. 08/29/21  Yes Wieting, Richard, MD  aspirin 325 MG tablet Take 325 mg by mouth daily.   Yes [provider]  atorvastatin  (LIPITOR) 80 MG tablet Take 80 mg by mouth daily. 05/11/21  Yes [provider]  baclofen (LIORESAL) 10 MG tablet Take 1 tablet (10 mg total) by mouth 2 (two) times daily as needed for muscle spasms. Home med. 08/16/21  Yes Darlin PriestlyLai, Britteney Ayotte, MD  cephALEXin (KEFLEX) 500 MG capsule Take 1 capsule (500 mg total) by mouth 2 (two) times daily. 11/01/21  Yes Jene EveryKinner, Robert, MD  Cranberry-Vitamin C (AZO CRANBERRY URINARY TRACT PO) Take 1 tablet by mouth 3 (three) times daily as needed.   Yes [provider]  cyclobenzaprine (FLEXERIL) 5 MG tablet Take 5 mg by mouth 3 (three) times daily as needed for muscle spasms.   Yes [provider]  diclofenac Sodium (VOLTAREN) 1 % GEL Apply 2 g topically 4 (four) times daily.   Yes [provider]  diltiazem (CARDIZEM CD) 360 MG 24 hr capsule Take 1 capsule (360 mg total) by mouth daily. 09/10/21  Yes Arnetha CourserAmin, Sumayya, MD  divalproex (DEPAKOTE) 500 MG DR tablet Take 2 tablets (1,000 mg total) by mouth 2 (two) times daily. Patient taking differently: Take 1,500 mg by mouth 2 (two) times daily. 09/25/21  Yes Wouk, Wilfred CurtisNoah Bedford, MD  ELIQUIS 5 MG TABS tablet Take 5 mg by mouth 2 (two) times daily. 06/16/21  Yes [provider]  escitalopram (LEXAPRO) 10 MG tablet Take 20 mg by mouth daily. 06/16/21  Yes [provider]  fluticasone (FLONASE) 50 MCG/ACT nasal spray Place 2 sprays into both nostrils 2 (two) times daily. 06/16/21  Yes [provider]  furosemide (LASIX) 20 MG tablet Take 20 mg by mouth daily.   Yes [provider]  hydrochlorothiazide (HYDRODIURIL) 25 MG tablet Take 25 mg by mouth daily.   Yes [provider]  HYDROcodone-acetaminophen (NORCO/VICODIN) 5-325 MG tablet Take 1 tablet by mouth every 4 (four) hours as needed. 09/16/21  Yes Ward, Layla MawKristen N, DO  hydrOXYzine (ATARAX) 25 MG tablet Take 25 mg by mouth daily. At noon   Yes [provider]  hydrOXYzine (ATARAX) 50 MG tablet Take 50  mg by mouth in the morning and at bedtime.   Yes [provider]  ibuprofen (ADVIL) 800 MG tablet Take 800 mg by mouth every 8 (eight) hours as needed.   Yes [provider]  INVEGA 9 MG 24 hr tablet Take 9 mg by mouth every morning. 07/21/21  Yes [provider]  loratadine (CLARITIN) 10 MG tablet Take 10 mg by mouth daily. 06/16/21  Yes [provider]  melatonin 3 MG TABS tablet Take 3 mg by mouth at bedtime. 06/08/21  Yes [provider]  metoprolol tartrate (LOPRESSOR) 50 MG tablet Take 100 mg by mouth 2 (two) times daily.   Yes [provider]  naproxen (NAPROSYN) 500 MG tablet Take 1 tablet (500 mg total) by mouth 2 (two) times daily with a meal. 10/07/21  Yes Jene EveryKinner, Robert, MD  omeprazole (PRILOSEC) 20 MG capsule Take 20 mg by mouth daily.   Yes [provider]  ondansetron (ZOFRAN-ODT) 4 MG disintegrating tablet Take 1 tablet (4 mg total) by mouth every 6 (six) hours as needed for nausea or vomiting. 09/16/21  Yes Ward, Baxter Hire N, DO  potassium chloride SA (KLOR-CON M) 20 MEQ tablet Take 1 tablet (20 mEq total) by mouth daily for 7 days. 10/29/21 11/05/21 Yes Sharman Cheek, MD  promethazine (PHENERGAN) 25 MG tablet Take 25 mg by mouth every 6 (six) hours as needed for nausea or vomiting.   Yes [provider]  tamsulosin (FLOMAX) 0.4 MG CAPS capsule Take 1 capsule (0.4 mg total) by mouth daily. 09/10/21  Yes Arnetha Courser, MD  traMADol (ULTRAM) 50 MG tablet Take 50 mg by mouth every 6 (six) hours as needed.   Yes [provider]    Review of Systems  Constitutional:  Positive for fatigue. Negative for appetite change.  HENT:  Positive for congestion and rhinorrhea. Negative for sore throat.   Eyes: Negative.   Respiratory:  Positive for apnea, cough, shortness of breath and wheezing.        + snoring  Cardiovascular:  Positive for chest pain, palpitations and leg swelling.  Gastrointestinal:  Positive for blood  in stool. Negative for abdominal distention and abdominal pain.  Endocrine: Negative.   Genitourinary:  Negative for dysuria.       Has shaking in his left arm when urinating  Musculoskeletal:  Positive for arthralgias (left knee) and back pain. Negative for neck pain.  Skin: Negative.   Allergic/Immunologic: Negative.   Neurological:  Positive for tremors (when urinating) and light-headedness. Negative for dizziness.  Hematological:  Negative for adenopathy. Bruises/bleeds easily.  Psychiatric/Behavioral:  Positive for dysphoric mood and sleep disturbance (sleeping on 3 pillows). The patient is nervous/anxious.    Vitals:   11/01/21 1212  BP: (!) 132/51  Pulse: 96  Resp: 20  SpO2: 95%  Weight: (!) 389 lb (176.4 kg)  Height: 6\' 5"  (1.956 m)   Wt Readings from Last 3 Encounters:  11/01/21 (!) 389 lb (176.4 kg)  11/01/21 (!) 399 lb 0.5 oz (181 kg)  10/27/21 (!) 399 lb 0.5 oz (181 kg)   Lab Results  Component Value Date   CREATININE 0.90 10/29/2021   CREATININE 1.03 10/27/2021   CREATININE 0.91 10/21/2021   Physical Exam Vitals and nursing note reviewed. Exam conducted with a chaperone present (caregiver (for most of the visit)).  Constitutional:      Appearance: Normal appearance.  HENT:     Head: Normocephalic and atraumatic.  Cardiovascular:     Rate and Rhythm: Normal rate. Rhythm irregular.  Pulmonary:     Effort: Pulmonary effort is normal. No respiratory distress.     Breath sounds: No wheezing, rhonchi or rales.  Abdominal:     General: There is no distension.     Palpations: Abdomen is soft.  Musculoskeletal:        General: No tenderness.     Cervical back: Normal range of motion.     Right lower leg: Edema (trace pitting) present.     Left lower leg: Edema (trace pitting) present.  Skin:    General: Skin is warm and dry.  Neurological:     Mental Status: He is alert and oriented to person, place, and time. Mental status is at baseline.  Psychiatric:         Attention and Perception: Attention normal.  Mood and Affect: Mood is anxious and depressed. Affect is flat.        Behavior: Behavior is agitated.   Assessment & Plan:  1: Chronic heart failure with mildly reduced ejection fraction- - NYHA class III - euvolemic today - not weighing as he says the scales that were provided "aren't working"; advised caregiver to get him a scale so that he can weigh daily and call for an overnight weight gain of > 2 pounds or a weekly weight gain of > 5 pounds - weight down 10 pounds from last visit here 5 days ago - not adding salt and says that he doesn't add salt to his food or eat salty things like chips etc - caregiver says if he doesn't like what they have to eat that he will just "walk out" - started entresto 24/26mg  BID at last visit but caregiver today says that previous caregiver did "something with the samples" so he hasn't started it yet - again, 1 bottle of entresto 24/26mg  provided today to start 1 tablet BID; current caregiver has to f/u with pharmacy as to why they haven't delivered it yet - will not give anymore entresto samples  - consider adding SGLT2 but will see how it does with entresto - check BMP next visit - BNP 10/29/21 was 243.6  2: HTN- - BP mildly elevated here but says it was "sky high" when triaged in the ED earlier today - to start entresto per above - sees PCP at the group home and explained that he really needed to contact PCP with his numerous health concerns - BMP 10/29/21 reviewed and showed sodium 138, potassium 4.4, creatinine 0.9 and GFR >60  3: Persistent atrial fibrillation- - saw cardiology Brion Aliment) 10/07/21 - HR irregular today  4: Schizophrenia- - on invega daily - patient is slightly tearful in the office once the caregiver walks out because he says that "something is wrong and no one with listen to me" - emotional support offered; again, encouraged f/u with PCP  5: Sleep apnea- - caregiver says that  he has a sleep evaluation in June - wearing oxygen at 4L around the clock when at the group home   Facility medication list reviewed.   Return in 1 week, sooner if needed

## 2021-11-01 NOTE — Telephone Encounter (Signed)
Notified by front desk staff that the patient was at our front desk and not leaving. He was requesting to be seen and tearful.   Reviewed provider schedules prior to going to see the patient. No DOD/ APP availability today.   I spoke with the patient from the front desk. He inquired if he could come back to the clinic area. I advised that I could not bring him back as no provider is available to see him at this time, and if having urgent symptoms he will need to go to the ER. The patient then advised he has been having chest pain and has CHF.  Prior to coming to our clinic today, he was already seen by Daniel Kindred, NP and Dr. Cyril [name] in the [facility] ED.  Brief review of Daniel [name]'s note from today: - patient seen by her on 10/27/21>> started on Entresto - not weighing daily>> lives in a Group home - will hold on adding SGLT2 as the patient is to follow up with urology today - HR irregular in clinic today - Sleep evaluation in June>> currently on 4 Liters O2 [place].    Brief review of Dr. Cyril [name] (ED) notes from today: HPI  Daniel [name] is a 31 y.o. male well-known to our department with history of atrial fibrillation, cardiomyopathy, conversion disorder, diabetes, hypertension, schizoaffective disorder who presents with complaints of dysuria.  Patient notes burning with urination over the last 2 days.  He reports he has had urinary tract infections in the past.  No fevers reported.  Given his symptoms consistent with UTI, will start the patient on Keflex, outpatient follow-up recommended   I again advised the patient that if he is having active symptoms of chest pain, he will need to go back to the ER. Per the patient "Dr. Cyril [name] won't treat me." I inquired what that means, per the patient "if I go back he will black-list me." The patient's caregiver from the group home walked up at this point and I inquired if what the patient said is true. Per the caregiver, if he can't go to [facility], he may have  to go to "lockup."  I advised the patient that we are an outpatient clinic, but are not equipped to treat urgent or emergent sitiuations, so the ER is advised if he feels he is having urgent symptoms.  He appeared to be in no distress.   I inquired what symptoms he is having that are new or different from him. Per the patient, his hands shaking are different.  He inquired if I could call 911. I advised, if I call 911, they will take him to the nearest ER, which is [facility]. I offered him an evaluation in our clinic tomorrow morning with Dr. Mariah [name] (DOD) at 11:00 am.  The patient started to stand without symptoms of distress and advised "you can" schedule him for the office tomorrow, but just as quickly stated "just forget it, I will go to [facility], " and proceeded to leave the office.   The patient is currently on Eliquis for his atrial fibrillation.

## 2021-11-01 NOTE — ED Notes (Signed)
Spoke with pt's primary caregiver at this time, OJ and gave discharge paperwork. Pt noted to be exiting out of the building with OJ, primary caregiver.

## 2021-11-01 NOTE — ED Triage Notes (Signed)
Pt via POV from [facility]. Pt c/o back pain, dysuria, wheezing, and lightheadedness for the past couple of days. Pt is on 3L [place]. Pt is A&Ox4 and NAD  Dr. [name], MD in triage at this time.

## 2021-11-01 NOTE — Patient Instructions (Addendum)
Continue weighing daily and call for an overnight weight gain of 3 pounds or more or a weekly weight gain of more than 5 pounds.   Start taking entresto as 1 tablet in the morning and 1 tablet in the evening

## 2021-11-03 ENCOUNTER — Ambulatory Visit: Payer: Medicaid Other | Admitting: Family

## 2021-11-04 ENCOUNTER — Emergency Department: Payer: Medicaid Other

## 2021-11-04 ENCOUNTER — Other Ambulatory Visit: Payer: Self-pay

## 2021-11-04 ENCOUNTER — Emergency Department
Admission: EM | Admit: 2021-11-04 | Discharge: 2021-11-04 | Disposition: A | Discharge status: Home / Self Care | Payer: Medicaid Other | Attending: Emergency Medicine | Admitting: Emergency Medicine

## 2021-11-04 DIAGNOSIS — R0602 Shortness of breath: Secondary | ICD-10-CM | POA: Diagnosis not present

## 2021-11-04 DIAGNOSIS — M791 Myalgia, unspecified site: Secondary | ICD-10-CM | POA: Diagnosis not present

## 2021-11-04 DIAGNOSIS — R531 Weakness: Secondary | ICD-10-CM | POA: Diagnosis not present

## 2021-11-04 DIAGNOSIS — R42 Dizziness and giddiness: Secondary | ICD-10-CM | POA: Diagnosis not present

## 2021-11-04 DIAGNOSIS — F449 Dissociative and conversion disorder, unspecified: Secondary | ICD-10-CM | POA: Diagnosis not present

## 2021-11-04 DIAGNOSIS — E119 Type 2 diabetes mellitus without complications: Secondary | ICD-10-CM | POA: Insufficient documentation

## 2021-11-04 DIAGNOSIS — I4891 Unspecified atrial fibrillation: Secondary | ICD-10-CM | POA: Diagnosis not present

## 2021-11-04 DIAGNOSIS — I10 Essential (primary) hypertension: Secondary | ICD-10-CM | POA: Insufficient documentation

## 2021-11-04 DIAGNOSIS — F259 Schizoaffective disorder, unspecified: Secondary | ICD-10-CM

## 2021-11-04 DIAGNOSIS — R002 Palpitations: Secondary | ICD-10-CM | POA: Diagnosis present

## 2021-11-04 LAB — URINALYSIS, ROUTINE W REFLEX MICROSCOPIC
Bilirubin Urine: NEGATIVE
Glucose, UA: NEGATIVE mg/dL
Hgb urine dipstick: NEGATIVE
Ketones, ur: NEGATIVE mg/dL
Leukocytes,Ua: NEGATIVE
Nitrite: NEGATIVE
Protein, ur: 30 mg/dL — AB
Specific Gravity, Urine: 1.013 (ref 1.005–1.030)
pH: 6 (ref 5.0–8.0)

## 2021-11-04 LAB — CBC WITH DIFFERENTIAL/PLATELET
Abs Immature Granulocytes: 0.07 10*3/uL (ref 0.00–0.07)
Basophils Absolute: 0 10*3/uL (ref 0.0–0.1)
Basophils Relative: 1 %
Eosinophils Absolute: 0.2 10*3/uL (ref 0.0–0.5)
Eosinophils Relative: 3 %
HCT: 41.8 % (ref 39.0–52.0)
Hemoglobin: 13.4 g/dL (ref 13.0–17.0)
Immature Granulocytes: 1 %
Lymphocytes Relative: 27 %
Lymphs Abs: 1.6 10*3/uL (ref 0.7–4.0)
MCH: 29 pg (ref 26.0–34.0)
MCHC: 32.1 g/dL (ref 30.0–36.0)
MCV: 90.5 fL (ref 80.0–100.0)
Monocytes Absolute: 0.6 10*3/uL (ref 0.1–1.0)
Monocytes Relative: 11 %
Neutro Abs: 3.4 10*3/uL (ref 1.7–7.7)
Neutrophils Relative %: 57 %
Platelets: 178 10*3/uL (ref 150–400)
RBC: 4.62 MIL/uL (ref 4.22–5.81)
RDW: 14.9 % (ref 11.5–15.5)
WBC: 5.9 10*3/uL (ref 4.0–10.5)
nRBC: 0 % (ref 0.0–0.2)

## 2021-11-04 LAB — CBC
HCT: 49 % (ref 39.0–52.0)
Hemoglobin: 15.8 g/dL (ref 13.0–17.0)
MCH: 28.7 pg (ref 26.0–34.0)
MCHC: 32.2 g/dL (ref 30.0–36.0)
MCV: 89.1 fL (ref 80.0–100.0)
Platelets: 223 10*3/uL (ref 150–400)
RBC: 5.5 MIL/uL (ref 4.22–5.81)
RDW: 14.8 % (ref 11.5–15.5)
WBC: 5.2 10*3/uL (ref 4.0–10.5)
nRBC: 0 % (ref 0.0–0.2)

## 2021-11-04 LAB — DIFFERENTIAL
Abs Immature Granulocytes: 0.07 10*3/uL (ref 0.00–0.07)
Basophils Absolute: 0 10*3/uL (ref 0.0–0.1)
Basophils Relative: 1 %
Eosinophils Absolute: 0.1 10*3/uL (ref 0.0–0.5)
Eosinophils Relative: 2 %
Immature Granulocytes: 1 %
Lymphocytes Relative: 26 %
Lymphs Abs: 1.5 10*3/uL (ref 0.7–4.0)
Monocytes Absolute: 0.6 10*3/uL (ref 0.1–1.0)
Monocytes Relative: 11 %
Neutro Abs: 3.4 10*3/uL (ref 1.7–7.7)
Neutrophils Relative %: 59 %

## 2021-11-04 LAB — BLOOD GAS, VENOUS
Acid-Base Excess: 10.2 mmol/L — ABNORMAL HIGH (ref 0.0–2.0)
Bicarbonate: 38.2 mmol/L — ABNORMAL HIGH (ref 20.0–28.0)
O2 Saturation: 54.7 %
Patient temperature: 37
pCO2, Ven: 66 mmHg — ABNORMAL HIGH (ref 44–60)
pH, Ven: 7.37 (ref 7.25–7.43)
pO2, Ven: 37 mmHg (ref 32–45)

## 2021-11-04 LAB — ETHANOL: Alcohol, Ethyl (B): <10 mg/dL (ref <10)

## 2021-11-04 LAB — BASIC METABOLIC PANEL
Anion gap: 9 (ref 5–15)
BUN: 15 mg/dL (ref 6–20)
CO2: 30 mmol/L (ref 22–32)
Calcium: 10.4 mg/dL — ABNORMAL HIGH (ref 8.9–10.3)
Chloride: 99 mmol/L (ref 98–111)
Creatinine, Ser: 1.16 mg/dL (ref 0.61–1.24)
GFR, Estimated: >60 mL/min (ref >60)
Glucose, Bld: 170 mg/dL — ABNORMAL HIGH (ref 70–99)
Potassium: 4.4 mmol/L (ref 3.5–5.1)
Sodium: 138 mmol/L (ref 135–145)

## 2021-11-04 LAB — URINE DRUG SCREEN, QUALITATIVE (ARMC ONLY)
Amphetamines, Ur Screen: NOT DETECTED
Barbiturates, Ur Screen: NOT DETECTED
Benzodiazepine, Ur Scrn: NOT DETECTED
Cannabinoid 50 Ng, Ur ~~LOC~~: NOT DETECTED
Cocaine Metabolite,Ur ~~LOC~~: NOT DETECTED
MDMA (Ecstasy)Ur Screen: NOT DETECTED
Methadone Scn, Ur: NOT DETECTED
Opiate, Ur Screen: POSITIVE — AB
Phencyclidine (PCP) Ur S: NOT DETECTED
Tricyclic, Ur Screen: POSITIVE — AB

## 2021-11-04 LAB — HEPATIC FUNCTION PANEL
ALT: 16 U/L (ref 0–44)
AST: 19 U/L (ref 15–41)
Albumin: 3.5 g/dL (ref 3.5–5.0)
Alkaline Phosphatase: 55 U/L (ref 38–126)
Bilirubin, Direct: <0.1 mg/dL (ref 0.0–0.2)
Total Bilirubin: 0.9 mg/dL (ref 0.3–1.2)
Total Protein: 6.5 g/dL (ref 6.5–8.1)

## 2021-11-04 LAB — AMMONIA: Ammonia: 34 umol/L (ref 9–35)

## 2021-11-04 LAB — PROTIME-INR
INR: 1.1 (ref 0.8–1.2)
Prothrombin Time: 14.1 seconds (ref 11.4–15.2)

## 2021-11-04 LAB — TROPONIN I (HIGH SENSITIVITY)
Troponin I (High Sensitivity): 4 ng/L (ref <18)
Troponin I (High Sensitivity): 4 ng/L (ref <18)

## 2021-11-04 LAB — CBG MONITORING, ED: Glucose-Capillary: 75 mg/dL (ref 70–99)

## 2021-11-04 LAB — APTT: aPTT: 33 seconds (ref 24–36)

## 2021-11-04 LAB — BRAIN NATRIURETIC PEPTIDE: B Natriuretic Peptide: 68.3 pg/mL (ref 0.0–100.0)

## 2021-11-04 MED ORDER — DILTIAZEM HCL 25 MG/5ML IV SOLN
10.0000 mg | Freq: Once | INTRAVENOUS | Status: AC
  Administered 2021-11-04: 10 mg via INTRAVENOUS
  Filled 2021-11-04: qty 5

## 2021-11-04 MED ORDER — SODIUM CHLORIDE 0.9 % IV BOLUS
500.0000 mL | Freq: Once | INTRAVENOUS | Status: AC
  Administered 2021-11-04: 500 mL via INTRAVENOUS

## 2021-11-04 MED ORDER — MORPHINE SULFATE (PF) 4 MG/ML IV SOLN
4.0000 mg | Freq: Once | INTRAVENOUS | Status: AC
  Administered 2021-11-04: 4 mg via INTRAVENOUS
  Filled 2021-11-04: qty 1

## 2021-11-04 MED ORDER — DEXTROSE 50 % IV SOLN
25.0000 mL | Freq: Once | INTRAVENOUS | Status: AC
  Administered 2021-11-04: 25 mL via INTRAVENOUS
  Filled 2021-11-04: qty 50

## 2021-11-04 MED ORDER — DILTIAZEM HCL 60 MG PO TABS
60.0000 mg | ORAL_TABLET | Freq: Once | ORAL | Status: AC
  Administered 2021-11-04: 60 mg via ORAL
  Filled 2021-11-04: qty 1

## 2021-11-04 NOTE — ED Notes (Signed)
Code stroke orders placed and called by MD robinson. CBG 75.

## 2021-11-04 NOTE — ED Triage Notes (Signed)
Patient to ER via [facility]. Patient called EMS after his group home, [facility], he states refused. Patient reports feeling dizzy and like his ammonia is high. Reports feeling sweaty and having centralized, non-radiating chest pain. Reports being compliant with his medications.   Ems EKG. Afb w/rvr rate 80--180, O2 sats 98% RA, BP 119/78.

## 2021-11-04 NOTE — ED Notes (Signed)
Pt states that he wants to stay in the hospital because he is stressed at his group home and that is why he keeps running away, states that the wife has cancer and this also bothers him.  Pt states that he has a conversion disorder and that is why he can't move his L side. Pt also doesn't like some of the rules at the group home.

## 2021-11-04 NOTE — ED Notes (Signed)
Called caregiver OJ at [phone] to come get pt for dc

## 2021-11-04 NOTE — ED Notes (Signed)
Pt in bed, pt c/o pt talking with pharm tech, pt states that he can't see out of his L eye and has some L arm weakness, pt able to move all extremities, equal strength in lower extremities, reports decreased sensation in arms and legs.  Pt appears sleepy. MD notified, md to bedside to eval pt.

## 2021-11-04 NOTE — ED Notes (Signed)
Pt placed on 4L O2 via [place].  Caregiver at bedside.

## 2021-11-04 NOTE — ED Provider Notes (Signed)
-----------------------------------------   5:19 PM on 11/04/2021 -----------------------------------------  Notified by nursing staff the patient had a change in altered mental status while I was tending another patient.  I went to assess the patient he does have left-sided weakness slurred speech seems drowsy has history of A-fib reported history of TIAs on Eliquis here for A-fib with RVR.  We will call code stroke.  Last seen normal roughly 15 minutes ago.  Clinical Course as of 11/04/21 1809  Thu Nov 04, 2021  1721 Repeat sugar is 75 was 170.  Will give half amp D50 in case it is rapidly dropping as he has been n.p.o. since arrival may explain his neuro change. [PR]  1752 CT head reassuring.  Patient evaluated by neurology not felt to be CVA and on further review patient has had multiple presentations similar to this in the past found to be psych driven vs supsected malingering.   Earlier in visit patient was put on O2 after receiving IV morphine.  He is now off O2.  VBG with mild elevation of his PCO2 which I think is secondary to respiratory suppression in the setting of narcotic medication.  He is protecting his airway at this time.  Do not appreciate any wheezing on exam.  He is at his baseline. At this point I do believe he is stable and appropriate for outpatient follow-up. [PR]    Clinical Course User Index [PR] Willy Eddy, MD      Willy Eddy, MD 11/04/21 [id number]

## 2021-11-04 NOTE — Consult Note (Signed)
NEUROLOGY TELECONSULTATION NOTE   Date of service: November 04, 2021 Patient Name: Daniel [name] MRN:  [id number] DOB:  1991-02-12 Reason for consult: telestroke  Requesting Provider: Dr. Merlyn Lot Consult Participants: myself, patient, bedside RN, telestroke RN Location of the provider: [facility] Location of the patient: [facility]  This consult was provided via telemedicine with 2-way video and audio communication. The patient/family was informed that care would be provided in this way and agreed to receive care in this manner.   _ _ _   _ __   _ __ _ _  __ __   _ __   __ _  History of Present Illness   31 yo man with pmhx significant for a fib on eliquis, schizoaffective disorder, psychogenic nonepileptic seizures, extensive documented history of conversion disorder presenting as L sided weakness with negative workup, seen by neurology in this system 4x for same in past 2 months who presented to ED with palpitations. At 1700 while in the ED he developed dysarthria and L sided weakness and stroke code was activated. Head CT NAICP (personal review). Patient was not a candidate for TNK 2/2 therapeutic anticoagulation with eliquis and low suspicion for ischemic stroke. Exam was highly functional and not c/w LVO therefore patient did not undergo vascular imaging as part of the stroke code. After the stroke code was completed patient stated that his sx were due to conversion disorder. Shortly after he decided he wanted to go home and his sx resolved.   ROS   Per HPI; all other systems reviewed and are negative  Past History   The following was personally reviewed:  Past Medical History:  Diagnosis Date   Atrial fibrillation ([place])    Cardiomyopathy ([place])    a. 07/2021 Echo: EF 50%, mild LVH, nl RV size/function. No significant valvular disease.   Chest pain    CHF (congestive heart failure) (HCC)    Conversion disorder    Current use of long term anticoagulation    Diabetes mellitus without  complication (HCC)    Hyperlipidemia    Hypertension    OSA (obstructive sleep apnea)    Persistent atrial fibrillation and flutter ([place])    a. CHA2DS2VASc = 3-4   Schizoaffective disorder (HCC)    Seizure disorder (HCC)    TIA (transient ischemic attack)    Urethral stricture    a. 08/2021 s/p cystoscopy and urethral dilation.   Past Surgical History:  Procedure Laterality Date   CYSTOSCOPY WITH URETHRAL DILATATION N/A 09/07/2021   Procedure: CYSTOSCOPY WITH URETHRAL DILATATION CATHETER PLACEMENT;  Surgeon: Abbie Sons, MD;  Location: [facility] ORS;  Service: Urology;  Laterality: N/A;   No family history on file. Social History   Socioeconomic History   Marital status: Single    Spouse name: Not on file   Number of children: Not on file   Years of education: Not on file   Highest education level: Not on file  Occupational History   Not on file  Tobacco Use   Smoking status: Former    Types: Cigarettes   Smokeless tobacco: Never  Vaping Use   Vaping Use: Never used  Substance and Sexual Activity   Alcohol use: Not Currently   Drug use: Never   Sexual activity: Not on file  Other Topics Concern   Not on file  Social History Narrative   Now living in a group home in [place].   Social Determinants of Health   Financial Resource Strain: Not on file  Food Insecurity: Not on file  Transportation Needs: Not on file  Physical Activity: Not on file  Stress: Not on file  Social Connections: Not on file   Allergies  Allergen Reactions   Haldol [Haloperidol] Other (See Comments)    SI   Abilify [Aripiprazole] Palpitations   Demerol [Meperidine Hcl] Hives    Medications   (Not in a hospital admission)     Current Facility-Administered Medications:    dextrose 50 % solution 25 mL, 25 mL, Intravenous, Once, Merlyn Lot, MD  Current Outpatient Medications:    allopurinol (ZYLOPRIM) 300 MG tablet, Take 1 tablet (300 mg total) by mouth daily., Disp: 30 tablet,  Rfl: 0   diltiazem (CARDIZEM CD) 360 MG 24 hr capsule, Take 1 capsule (360 mg total) by mouth daily., Disp: 30 capsule, Rfl: 1   divalproex (DEPAKOTE) 500 MG DR tablet, Take 2 tablets (1,000 mg total) by mouth 2 (two) times daily., Disp: , Rfl:    ELIQUIS 5 MG TABS tablet, Take 5 mg by mouth 2 (two) times daily., Disp: , Rfl:    escitalopram (LEXAPRO) 10 MG tablet, Take 20 mg by mouth daily., Disp: , Rfl:    furosemide (LASIX) 20 MG tablet, Take 20 mg by mouth daily., Disp: , Rfl:    hydrOXYzine (ATARAX) 25 MG tablet, Take 25 mg by mouth daily. At noon, Disp: , Rfl:    hydrOXYzine (ATARAX) 50 MG tablet, Take 50 mg by mouth in the morning and at bedtime., Disp: , Rfl:    INVEGA 9 MG 24 hr tablet, Take 9 mg by mouth every morning., Disp: , Rfl:    loratadine (CLARITIN) 10 MG tablet, Take 10 mg by mouth daily., Disp: , Rfl:    melatonin 3 MG TABS tablet, Take 3 mg by mouth at bedtime., Disp: , Rfl:    metoprolol tartrate (LOPRESSOR) 50 MG tablet, Take 100 mg by mouth 2 (two) times daily., Disp: , Rfl:    omeprazole (PRILOSEC) 20 MG capsule, Take 20 mg by mouth daily., Disp: , Rfl:    potassium chloride SA (KLOR-CON M) 20 MEQ tablet, Take 1 tablet (20 mEq total) by mouth daily for 7 days., Disp: 7 tablet, Rfl: 0   sacubitril-valsartan (ENTRESTO) 24-26 MG, Take 1 tablet by mouth 2 (two) times daily., Disp: 60 tablet, Rfl: 3   albuterol (VENTOLIN HFA) 108 (90 Base) MCG/ACT inhaler, Inhale 2 puffs into the lungs every 6 (six) hours as needed for wheezing., Disp: , Rfl:    aspirin 325 MG tablet, Take 325 mg by mouth daily. (Patient not taking: Reported on 11/04/2021), Disp: , Rfl:    atorvastatin (LIPITOR) 80 MG tablet, Take 80 mg by mouth daily. (Patient not taking: Reported on 11/04/2021), Disp: , Rfl:    baclofen (LIORESAL) 10 MG tablet, Take 1 tablet (10 mg total) by mouth 2 (two) times daily as needed for muscle spasms. Home med., Disp: 30 each, Rfl: 0   cephALEXin (KEFLEX) 500 MG capsule, Take 1 capsule  (500 mg total) by mouth 2 (two) times daily., Disp: 14 capsule, Rfl: 0   Cranberry-Vitamin C (AZO CRANBERRY URINARY TRACT PO), Take 1 tablet by mouth 3 (three) times daily as needed., Disp: , Rfl:    cyclobenzaprine (FLEXERIL) 5 MG tablet, Take 5 mg by mouth 3 (three) times daily as needed for muscle spasms. (Patient not taking: Reported on 11/04/2021), Disp: , Rfl:    diclofenac Sodium (VOLTAREN) 1 % GEL, Apply 2 g topically 4 (four) times daily., Disp: ,  Rfl:    fluticasone (FLONASE) 50 MCG/ACT nasal spray, Place 2 sprays into both nostrils 2 (two) times daily., Disp: , Rfl:    hydrochlorothiazide (HYDRODIURIL) 25 MG tablet, Take 25 mg by mouth daily. (Patient not taking: Reported on 11/04/2021), Disp: , Rfl:    HYDROcodone-acetaminophen (NORCO/VICODIN) 5-325 MG tablet, Take 1 tablet by mouth every 4 (four) hours as needed. (Patient not taking: Reported on 11/04/2021), Disp: 15 tablet, Rfl: 0   ibuprofen (ADVIL) 800 MG tablet, Take 800 mg by mouth every 8 (eight) hours as needed. (Patient not taking: Reported on 11/04/2021), Disp: , Rfl:    naproxen (NAPROSYN) 500 MG tablet, Take 1 tablet (500 mg total) by mouth 2 (two) times daily with a meal. (Patient not taking: Reported on 11/04/2021), Disp: 20 tablet, Rfl: 2   ondansetron (ZOFRAN-ODT) 4 MG disintegrating tablet, Take 1 tablet (4 mg total) by mouth every 6 (six) hours as needed for nausea or vomiting. (Patient not taking: Reported on 11/04/2021), Disp: 20 tablet, Rfl: 0   promethazine (PHENERGAN) 25 MG tablet, Take 25 mg by mouth every 6 (six) hours as needed for nausea or vomiting. (Patient not taking: Reported on 11/04/2021), Disp: , Rfl:    tamsulosin (FLOMAX) 0.4 MG CAPS capsule, Take 1 capsule (0.4 mg total) by mouth daily. (Patient not taking: Reported on 11/04/2021), Disp: 30 capsule, Rfl: 2   traMADol (ULTRAM) 50 MG tablet, Take 50 mg by mouth every 6 (six) hours as needed. (Patient not taking: Reported on 11/04/2021), Disp: , Rfl:   Vitals   Vitals:    11/04/21 1600 11/04/21 1630 11/04/21 1700 11/04/21 1715  BP: 104/66 102/86 109/73   Pulse: 86 92 90   Resp:    19  Temp:      TempSrc:      SpO2: 97% (!) 80% 96%   Weight:      Height:         Body mass index is 46.13 kg/m.  Physical Exam   Exam performed over telemedicine with 2-way video and audio communication and with assistance of bedside RN  Physical Exam Gen: A&O x4, NAD Resp: normal WOB CV: extremities appear well-perfused  Neuro: *MS: A&O x4. Follows multi-step commands.  *Speech: mild dysarthria, no aphasia, able to name and repeat *CN: PERRL 50mm, EOMI, VFF by confrontation, sensation intact, asymmetric smile with decreased movement on L, hearing intact to voice *Motor:   Normal bulk.  No tremor, rigidity or bradykinesia. Initially patient would not lift LUE or LLE at all but with significant coaching exam showed no drift in RUE, drift in LUE and BLE but not to bed.  *Sensory: Reports he is insensate on LUE and LLE. Symmetric. No double-simultaneous extinction.  *Coordination:  FNF intact bilat *Reflexes:  UTA 2/2 tele-exam *Gait: deferred  NIHSS  1a Level of Conscious.: 0 1b LOC Questions: 0 1c LOC Commands: 0 2 Best Gaze: 0 3 Visual: 0 4 Facial Palsy: 1 5a Motor Arm - left: 1 5b Motor Arm - Right: 0 6a Motor Leg - Left: 1 6b Motor Leg - Right: 1 7 Limb Ataxia: 0 8 Sensory: 1 9 Best Language: 0 10 Dysarthria: 1 11 Extinct. and Inatten.: 0  TOTAL: 6   Premorbid mRS = 2   Labs   CBC:  Recent Labs  Lab 10/29/21 1633 11/04/21 1300  WBC 5.9 5.2  HGB 12.8* 15.8  HCT 39.1 49.0  MCV 87.9 89.1  PLT 158 [id number]    Basic Metabolic Panel:  Lab  Results  Component Value Date   NA 138 11/04/2021   K 4.4 11/04/2021   CO2 30 11/04/2021   GLUCOSE 170 (H) 11/04/2021   BUN 15 11/04/2021   CREATININE 1.16 11/04/2021   CALCIUM 10.4 (H) 11/04/2021   GFRNONAA >60 11/04/2021   GFRAA >60 04/29/2013   Lipid Panel:  Lab Results  Component Value Date    LDLCALC 50 08/29/2021   HgbA1c:  Lab Results  Component Value Date   HGBA1C 6.2 (H) 09/04/2021   Urine Drug Screen:     Component Value Date/Time   LABOPIA POSITIVE (A) 10/16/2021 2258   COCAINSCRNUR NONE DETECTED 10/16/2021 2258   LABBENZ NONE DETECTED 10/16/2021 2258   AMPHETMU NONE DETECTED 10/16/2021 2258   THCU NONE DETECTED 10/16/2021 2258   LABBARB NONE DETECTED 10/16/2021 2258    Alcohol Level     Component Value Date/Time   ETH <10 10/05/2021 0944     Impression   31 yo man with pmhx significant for a fib on eliquis, schizoaffective disorder, psychogenic nonepileptic seizures, extensive documented history of conversion disorder presenting as L sided weakness with negative workup, seen by neurology in this system 4x for same in past 2 months who presented to ED with palpitations. At 1700 while in the ED he developed dysarthria and L sided weakness and stroke code was activated. Head CT NAICP (personal review). Patient was not a candidate for TNK 2/2 therapeutic anticoagulation with eliquis and low suspicion for ischemic stroke. Exam was highly functional and sx resolved when patient decided he wanted to go home. As recommended in previous neurology notes, when patient presents with same sx in the future it is reasonable to do a noncon head CT since he is on eliquis, but would consider STAT MRI vs careful monitoring instead of CTA/CTP given the amount of radiation patient is regularly receiving 2/2 repeated presentations with conversion disorder will increase his risk of malignancy.  Recommendations   - No further workup, patient to be discharged from ED ______________________________________________________________________   Thank you for the opportunity to take part in the care of this patient. If you have any further questions, please contact the neurology consultation attending.  Signed,  Su Monks, MD [facility] [phone]  If 7pm- 7am, please  page neurology on call as listed in [place].

## 2021-11-04 NOTE — ED Provider Notes (Signed)
[facility] Provider Note    Event Date/Time   First MD Initiated Contact with Patient 11/04/21 1313     (approximate)   History   Dizziness   HPI  Colbey Wirtanen is a 31 y.o. male with history of atrial fibrillation on diltiazem, cardiomyopathy, schizoaffective disorder, diabetes, hypertension, and conversion disorder who presents with palpitations that started acutely around midnight.  They have been persistent since then.  He feels like he is in rapid A-fib.  He reports associated generalized body aches and joint pains which are typical for him as well as some chest pain.  He reports shortness of breath and lightheadedness.  The patient states that he has taken all of his normal doses of medication.    Physical Exam   Triage Vital Signs: ED Triage Vitals [11/04/21 1258]  Enc Vitals Group     BP 115/86     Pulse Rate (!) 152     Resp 20     Temp 98 F (36.7 C)     Temp Source Oral     SpO2 99 %     Weight (!) 389 lb (176.4 kg)     Height 6\' 5"  (1.956 m)     Head Circumference      Peak Flow      Pain Score 10     Pain Loc      Pain Edu?      Excl. in GC?     Most recent vital signs: Vitals:   11/04/21 1545 11/04/21 1600  BP:  104/66  Pulse:  86  Resp: 19   Temp:    SpO2:  97%     General: And oriented, no distress. CV:  Good peripheral perfusion.  Tachycardic, irregular rhythm.  Normal heart sounds Resp:  Normal effort.  Lungs CTA B. Abd:  No distention.  Other:  No peripheral edema.   ED Results / Procedures / Treatments   Labs (all labs ordered are listed, but only abnormal results are displayed) Labs Reviewed  BASIC METABOLIC PANEL - Abnormal; Notable for the following components:      Result Value   Glucose, Bld 170 (*)    Calcium 10.4 (*)    All other components within normal limits  CBC  BRAIN NATRIURETIC PEPTIDE  AMMONIA  HEPATIC FUNCTION PANEL  TROPONIN I (HIGH SENSITIVITY)  TROPONIN I (HIGH SENSITIVITY)      EKG  ED ECG REPORT I, 01/04/22, the attending physician, personally viewed and interpreted this ECG.  Date: 11/04/2021 EKG Time: 1248 Rate: 149 Rhythm: Atrial fibrillation with RVR QRS Axis: normal Intervals: normal ST/T Wave abnormalities: Nonspecific ST abnormality Narrative Interpretation: no evidence of acute ischemia    RADIOLOGY  Chest x-ray: I independently viewed and interpreted the images; there is no focal consolidation or edema  PROCEDURES:  Critical Care performed: Yes, see critical care procedure note(s)  .Critical Care  Performed by: 01/04/2022, MD Authorized by: Dionne Bucy, MD   Critical care provider statement:    Critical care time (minutes):  20   Critical care was necessary to treat or prevent imminent or life-threatening deterioration of the following conditions:  Cardiac failure and circulatory failure   Critical care was time spent personally by me on the following activities:  Development of treatment plan with patient or surrogate, discussions with consultants, evaluation of patient's response to treatment, examination of patient, ordering and review of laboratory studies, ordering and review of radiographic studies, ordering and performing  treatments and interventions, pulse oximetry, re-evaluation of patient's condition and review of old charts    MEDICATIONS ORDERED IN ED: Medications  diltiazem (CARDIZEM) injection 10 mg (10 mg Intravenous Given 11/04/21 1341)  sodium chloride 0.9 % bolus 500 mL (0 mLs Intravenous Stopped 11/04/21 1537)  morphine (PF) 4 MG/ML injection 4 mg (4 mg Intravenous Given 11/04/21 1338)  diltiazem (CARDIZEM) tablet 60 mg (60 mg Oral Given 11/04/21 1411)     IMPRESSION / MDM / ASSESSMENT AND PLAN / ED COURSE  I reviewed the triage vital signs and the nursing notes.  31 year old male with PMH as noted above presents with palpitations, chest pain, and is in rapid atrial fibrillation.  On  exam, the patient is alert and oriented, his heart rate is in the 140s, blood pressure is borderline low, and there is no peripheral edema.  I reviewed the past medical records.  The patient has had several prior ED visits for atrial fibrillation with RVR and other acute presentations.  Per the hospitalist discharge summary on 4/29, he was admitted at that time due to rapid atrial fibrillation and metabolic encephalopathy related to medications.  Differential diagnosis includes, but is not limited to, atrial fibrillation with RVR, atrial flutter, other tachydysrhythmia, medication noncompliance, new onset CHF, ACS.  We will give IV Cardizem, obtain chest x-ray, lab work-up, and reassess.  Patient's presentation is most consistent with acute presentation with potential threat to life or bodily function.  The patient is on the cardiac monitor to evaluate for evidence of arrhythmia and/or significant heart rate changes.  ----------------------------------------- 4:05 PM on 11/04/2021 -----------------------------------------  The patient's heart rate started to improve with a dose of IV Cardizem so I ordered a p.o. dose and his heart rate is now in the 90s.  The patient does appear somewhat more somnolent.  Initial troponin is negative.  BNP is normal.  There is no evidence of acute CHF.  There was some concern that he might have hepatic encephalopathy so I ordered an ammonia level.  Given that he is now rate controlled I anticipate that the patient will be able to be discharged home.  We will obtain a repeat troponin, the ammonia level, and reassess.  I signed the patient out to the oncoming ED physician Dr. Roxan Hockey.   FINAL CLINICAL IMPRESSION(S) / ED DIAGNOSES   Final diagnoses:  Atrial fibrillation with RVR (HCC)     Rx / DC Orders   ED Discharge Orders     None        Note:  This document was prepared using Dragon voice recognition software and may include unintentional  dictation errors.    Dionne Bucy, MD 11/04/21 1655

## 2021-11-04 NOTE — ED Notes (Signed)
Pt in bed, pt awake and answering questions appropriately, pt states that he has 6/10 general body pain, pt states that he just had a TIA, and knows that it was a [place] because he has had one before.

## 2021-11-04 NOTE — ED Notes (Signed)
Pt in bed, caregiver at bedside, pt states that he is ready to go home, caregiver verbalized understanding d/c instructions and follow up, pt requests a sprite, pt drinking sprite using his L arm and ambulatory from dpt with steady gait.

## 2021-11-05 NOTE — Progress Notes (Addendum)
Code stroke activated 1725 stack arrival

## 2021-11-06 NOTE — Progress Notes (Deleted)
Patient ID: Daniel [name], male    DOB: 03-02-1991, 31 y.o.   MRN: [id number]  HPI  Daniel [name] is a 31 y/o male with a history of DM, hyperlipidemia, HTN, atrial fibrillation, conversion disorder, sleep apnea, schizoaffective disorder, seizure, TIA, tobacco use and chronic heart failure.   Echo report from 10/22/21 reviewed and showed an EF of 45-50%. Echo report from 08/04/21 reviewed and showed an EF of 50% along with mild LVH  Was in the ED 11/04/21 due to palpitations thought to be due to a fib along with shortness of breath and palpitations. IV cardizem given. Developed AMS with code stroke called. Head CT negative. Glucose dropping so amp of D50 provided. Neurology evaluation done. He was released. Was in the ED 11/01/21, 10/29/21, 10/27/21, 10/24/21 and also 10/21/21. Was in the ED 10/16/21 due to chest pain and c/o current caregiver. Found to be in AF RVR. Given IV metoprolol along with IV lasix. Transitioned to oral medications. Head/cervical CT done because he says that he had a previous fall. Evaluated and subsequently released. Has had 7 other ED visits the month of May. In April had 9 ED visits along with 1 admission. Had 4 ED visits and 3 admissions in March.   He presents today for a follow-up visit with a chief complaint of     Past Medical History:  Diagnosis Date   Atrial fibrillation (HCC)    Cardiomyopathy (HCC)    a. 07/2021 Echo: EF 50%, mild LVH, nl RV size/function. No significant valvular disease.   Chest pain    CHF (congestive heart failure) (HCC)    Conversion disorder    Current use of long term anticoagulation    Diabetes mellitus without complication (HCC)    Hyperlipidemia    Hypertension    OSA (obstructive sleep apnea)    Persistent atrial fibrillation and flutter (HCC)    a. CHA2DS2VASc = 3-4   Schizoaffective disorder (HCC)    Seizure disorder (HCC)    TIA (transient ischemic attack)    Urethral stricture    a. 08/2021 s/p cystoscopy and urethral dilation.    Past Surgical History:  Procedure Laterality Date   CYSTOSCOPY WITH URETHRAL DILATATION N/A 09/07/2021   Procedure: CYSTOSCOPY WITH URETHRAL DILATATION CATHETER PLACEMENT;  Surgeon: Riki Altes, MD;  Location: [facility];  Service: Urology;  Laterality: N/A;   No family history on file. Social History   Tobacco Use   Smoking status: Former    Types: Cigarettes   Smokeless tobacco: Never  Substance Use Topics   Alcohol use: Not Currently   Allergies  Allergen Reactions   Haldol [Haloperidol] Other (See Comments)    SI   Abilify [Aripiprazole] Palpitations   Demerol [Meperidine Hcl] Hives     Review of Systems  Constitutional:  Positive for fatigue. Negative for appetite change.  HENT:  Positive for congestion and rhinorrhea. Negative for sore throat.   Eyes: Negative.   Respiratory:  Positive for apnea, cough, shortness of breath and wheezing.        + snoring  Cardiovascular:  Positive for chest pain, palpitations and leg swelling.  Gastrointestinal:  Positive for blood in stool. Negative for abdominal distention and abdominal pain.  Endocrine: Negative.   Genitourinary:  Negative for dysuria.       Has shaking in his left arm when urinating  Musculoskeletal:  Positive for arthralgias (left knee) and back pain. Negative for neck pain.  Skin: Negative.   Allergic/Immunologic: Negative.   Neurological:  Positive for tremors (when urinating) and light-headedness. Negative for dizziness.  Hematological:  Negative for adenopathy. Bruises/bleeds easily.  Psychiatric/Behavioral:  Positive for dysphoric mood and sleep disturbance (sleeping on 3 pillows). The patient is nervous/anxious.       Physical Exam Vitals and nursing note reviewed. Exam conducted with a chaperone present (caregiver (for most of the visit)).  Constitutional:      Appearance: Normal appearance.  HENT:     Head: Normocephalic and atraumatic.  Cardiovascular:     Rate and Rhythm: Normal rate.  Rhythm irregular.  Pulmonary:     Effort: Pulmonary effort is normal. No respiratory distress.     Breath sounds: No wheezing, rhonchi or rales.  Abdominal:     General: There is no distension.     Palpations: Abdomen is soft.  Musculoskeletal:        General: No tenderness.     Cervical back: Normal range of motion.     Right lower leg: Edema (trace pitting) present.     Left lower leg: Edema (trace pitting) present.  Skin:    General: Skin is warm and dry.  Neurological:     Mental Status: He is alert and oriented to person, place, and time. Mental status is at baseline.  Psychiatric:        Attention and Perception: Attention normal.        Mood and Affect: Mood is anxious and depressed. Affect is flat.        Behavior: Behavior is agitated.    Assessment & Plan:  1: Chronic heart failure with mildly reduced ejection fraction- - NYHA class III - euvolemic today - not weighing as he says the scales that were provided "aren't working"; advised caregiver to get him a scale so that he can weigh daily and call for an overnight weight gain of > 2 pounds or a weekly weight gain of > 5 pounds - weight 389 pounds from last visit here 1 week ago - not adding salt and says that he doesn't add salt to his food or eat salty things like chips etc - caregiver says if he doesn't like what they have to eat that he will just "walk out" - will not give anymore entresto samples  - consider adding SGLT2 but will see how it does with entresto - BNP 11/04/21 was 68.3  2: HTN- - BP  - sees PCP at the group home and explained that he really needed to contact PCP with his numerous health concerns - BMP 11/04/21 reviewed and showed sodium 138, potassium 4.4, creatinine 1.16 and GFR >60  3: Persistent atrial fibrillation- - saw cardiology Brion Aliment) 10/07/21 - HR irregular today  4: Schizophrenia- - on invega daily - patient is slightly tearful in the office once the caregiver walks out because he says  that "something is wrong and no one with listen to me" - emotional support offered; again, encouraged f/u with PCP  5: Sleep apnea- - caregiver says that he has a sleep evaluation in June - wearing oxygen at 4L around the clock when at the group home   Facility medication list reviewed.

## 2021-11-07 ENCOUNTER — Other Ambulatory Visit: Payer: Self-pay

## 2021-11-07 ENCOUNTER — Emergency Department
Admission: EM | Admit: 2021-11-07 | Discharge: 2021-11-07 | Disposition: A | Discharge status: Home / Self Care | Payer: Medicaid Other | Attending: Emergency Medicine | Admitting: Emergency Medicine

## 2021-11-07 DIAGNOSIS — Z043 Encounter for examination and observation following other accident: Secondary | ICD-10-CM | POA: Insufficient documentation

## 2021-11-07 DIAGNOSIS — E119 Type 2 diabetes mellitus without complications: Secondary | ICD-10-CM | POA: Insufficient documentation
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
## 2021-11-08 ENCOUNTER — Ambulatory Visit: Payer: Medicaid Other | Admitting: Family

## 2021-11-08 ENCOUNTER — Ambulatory Visit (INDEPENDENT_AMBULATORY_CARE_PROVIDER_SITE_OTHER): Payer: Medicaid Other | Admitting: Internal Medicine

## 2021-11-08 ENCOUNTER — Telehealth: Payer: Self-pay | Admitting: Family

## 2021-11-08 VITALS — BP 95/69 | HR 97 | Resp 16 | Ht 77.0 in | Wt >= 6400 oz

## 2021-11-08 DIAGNOSIS — G4733 Obstructive sleep apnea (adult) (pediatric): Secondary | ICD-10-CM

## 2021-11-08 DIAGNOSIS — G4710 Hypersomnia, unspecified: Secondary | ICD-10-CM | POA: Diagnosis not present

## 2021-11-08 NOTE — Progress Notes (Signed)
[facility] [address] [place], [place] [address]  Pulmonary Sleep Medicine   Office Visit Note  Patient Name: Daniel [name] DOB: [birth year] MRN [id number]    Chief Complaint: sleep evaluation  Brief History: Daniel [name] is seen today for evaluation for possible sleep apnea. Pt is constantly tired. He snores very loudly, has morning headaches, mood changes, and gasping and choking at night. Sleep quality is terrible. He is always tired and does take naps.  Reported sleepiness is  severe  and the Epworth Sleepiness Score is 20 out of 24. The patient does  take naps.  The patient does complain of limb movements disrupting sleep.  ROS  General: (-) fever, (-) chills, (-) night sweat Nose and Sinuses: (-) nasal stuffiness or itchiness, (-) postnasal drip, (-) nosebleeds, (-) sinus trouble. Mouth and Throat: (-) sore throat, (-) hoarseness. Neck: (-) swollen glands, (-) enlarged thyroid, (-) neck pain. Respiratory: - cough, + shortness of breath, - wheezing. Neurologic: - numbness, - tingling. Psychiatric: + anxiety, + depression   Current Medication: Outpatient Encounter Medications as of 11/08/2021  Medication Sig Note   albuterol (VENTOLIN HFA) 108 (90 Base) MCG/ACT inhaler Inhale 2 puffs into the lungs every 6 (six) hours as needed for wheezing.    allopurinol (ZYLOPRIM) 300 MG tablet Take 1 tablet (300 mg total) by mouth daily.    aspirin 325 MG tablet Take 325 mg by mouth daily. (Patient not taking: Reported on 11/04/2021)    atorvastatin (LIPITOR) 80 MG tablet Take 80 mg by mouth daily. (Patient not taking: Reported on 11/04/2021)    baclofen (LIORESAL) 10 MG tablet Take 1 tablet (10 mg total) by mouth 2 (two) times daily as needed for muscle spasms. Home med.    cephALEXin (KEFLEX) 500 MG capsule Take 1 capsule (500 mg total) by mouth 2 (two) times daily. 11/04/2021: New medication, not started yet   Cranberry-Vitamin C (AZO CRANBERRY URINARY TRACT PO) Take 1 tablet  by mouth 3 (three) times daily as needed.    cyclobenzaprine (FLEXERIL) 5 MG tablet Take 5 mg by mouth 3 (three) times daily as needed for muscle spasms. (Patient not taking: Reported on 11/04/2021)    diclofenac Sodium (VOLTAREN) 1 % GEL Apply 2 g topically 4 (four) times daily.    diltiazem (CARDIZEM CD) 360 MG 24 hr capsule Take 1 capsule (360 mg total) by mouth daily.    divalproex (DEPAKOTE) 500 MG DR tablet Take 2 tablets (1,000 mg total) by mouth 2 (two) times daily.    ELIQUIS 5 MG TABS tablet Take 5 mg by mouth 2 (two) times daily.    escitalopram (LEXAPRO) 10 MG tablet Take 20 mg by mouth daily.    fluticasone (FLONASE) 50 MCG/ACT nasal spray Place 2 sprays into both nostrils 2 (two) times daily.    furosemide (LASIX) 20 MG tablet Take 20 mg by mouth daily.    hydrochlorothiazide (HYDRODIURIL) 25 MG tablet Take 25 mg by mouth daily. (Patient not taking: Reported on 11/04/2021)    HYDROcodone-acetaminophen (NORCO/VICODIN) 5-325 MG tablet Take 1 tablet by mouth every 4 (four) hours as needed. (Patient not taking: Reported on 11/04/2021)    hydrOXYzine (ATARAX) 25 MG tablet Take 25 mg by mouth daily. At noon    hydrOXYzine (ATARAX) 50 MG tablet Take 50 mg by mouth in the morning and at bedtime.    ibuprofen (ADVIL) 800 MG tablet Take 800 mg by mouth every 8 (eight) hours as needed. (Patient not taking: Reported on 11/04/2021)  INVEGA 9 MG 24 hr tablet Take 9 mg by mouth every morning.    loratadine (CLARITIN) 10 MG tablet Take 10 mg by mouth daily.    melatonin 3 MG TABS tablet Take 3 mg by mouth at bedtime.    metoprolol tartrate (LOPRESSOR) 50 MG tablet Take 100 mg by mouth 2 (two) times daily.    naproxen (NAPROSYN) 500 MG tablet Take 1 tablet (500 mg total) by mouth 2 (two) times daily with a meal. (Patient not taking: Reported on 11/04/2021)    omeprazole (PRILOSEC) 20 MG capsule Take 20 mg by mouth daily.    ondansetron (ZOFRAN-ODT) 4 MG disintegrating tablet Take 1 tablet (4 mg total) by  mouth every 6 (six) hours as needed for nausea or vomiting. (Patient not taking: Reported on 11/04/2021)    potassium chloride SA (KLOR-CON M) 20 MEQ tablet Take 1 tablet (20 mEq total) by mouth daily for 7 days.    promethazine (PHENERGAN) 25 MG tablet Take 25 mg by mouth every 6 (six) hours as needed for nausea or vomiting. (Patient not taking: Reported on 11/04/2021)    sacubitril-valsartan (ENTRESTO) 24-26 MG Take 1 tablet by mouth 2 (two) times daily.    tamsulosin (FLOMAX) 0.4 MG CAPS capsule Take 1 capsule (0.4 mg total) by mouth daily. (Patient not taking: Reported on 11/04/2021)    traMADol (ULTRAM) 50 MG tablet Take 50 mg by mouth every 6 (six) hours as needed. (Patient not taking: Reported on 11/04/2021)    No facility-administered encounter medications on file as of 11/08/2021.    Surgical History: Past Surgical History:  Procedure Laterality Date   CYSTOSCOPY WITH URETHRAL DILATATION N/A 09/07/2021   Procedure: CYSTOSCOPY WITH URETHRAL DILATATION CATHETER PLACEMENT;  Surgeon: Riki Altes, MD;  Location: [facility] ORS;  Service: Urology;  Laterality: N/A;    Medical History: Past Medical History:  Diagnosis Date   Atrial fibrillation (HCC)    Cardiomyopathy (HCC)    a. 07/2021 Echo: EF 50%, mild LVH, nl RV size/function. No significant valvular disease.   Chest pain    CHF (congestive heart failure) (HCC)    Conversion disorder    Current use of long term anticoagulation    Diabetes mellitus without complication (HCC)    Hyperlipidemia    Hypertension    OSA (obstructive sleep apnea)    Persistent atrial fibrillation and flutter (HCC)    a. CHA2DS2VASc = 3-4   Schizoaffective disorder (HCC)    Seizure disorder (HCC)    TIA (transient ischemic attack)    Urethral stricture    a. 08/2021 s/p cystoscopy and urethral dilation.    Family History: Non contributory to the present illness  Social History: Social History   Socioeconomic History   Marital status: Single     Spouse name: Not on file   Number of children: Not on file   Years of education: Not on file   Highest education level: Not on file  Occupational History   Not on file  Tobacco Use   Smoking status: Former    Types: Cigarettes   Smokeless tobacco: Never  Vaping Use   Vaping Use: Never used  Substance and Sexual Activity   Alcohol use: Not Currently   Drug use: Never   Sexual activity: Not on file  Other Topics Concern   Not on file  Social History Narrative   Now living in a group home in [place].   Social Determinants of Health   Financial Resource Strain: Not on file  Food Insecurity: Not on file  Transportation Needs: Not on file  Physical Activity: Not on file  Stress: Not on file  Social Connections: Not on file  Intimate Partner Violence: Not on file    Vital Signs: There were no vitals taken for this visit. There is no height or weight on file to calculate BMI.    Examination: General Appearance: The patient is well-developed, well-nourished, and in no distress. Neck Circumference: 48cm Skin: Gross inspection of skin unremarkable. Head: normocephalic, no gross deformities. Eyes: no gross deformities noted. ENT: ears appear grossly normal Neurologic: Alert and oriented. No involuntary movements.    EPWORTH SLEEPINESS SCALE:  Scale:  (0)= no chance of dozing; (1)= slight chance of dozing; (2)= moderate chance of dozing; (3)= high chance of dozing  Chance  Situtation    Sitting and reading: 3    Watching TV: 3    Sitting Inactive in public: 3    As a passenger in car: 3      Lying down to rest: 3    Sitting and talking: 2    Sitting quielty after lunch: 3    In a car, stopped in traffic: n/a   TOTAL SCORE:   20 out of 24    SLEEP STUDIES:       LABS: Recent Results (from the past 2160 hour(s))  Basic metabolic panel     Status: Abnormal   Collection Time: 08/12/21  1:16 PM  Result Value Ref Range   Sodium 137 135 - 145 mmol/L    Potassium 3.7 3.5 - 5.1 mmol/L   Chloride 95 (L) 98 - 111 mmol/L   CO2 32 22 - 32 mmol/L   Glucose, Bld 110 (H) 70 - 99 mg/dL    Comment: Glucose reference range applies only to samples taken after fasting for at least 8 hours.   BUN 15 6 - 20 mg/dL   Creatinine, Ser 1.610.94 0.61 - 1.24 mg/dL   Calcium 8.9 8.9 - 09.610.3 mg/dL   GFR, Estimated >04>60 >54>60 mL/min    Comment: (NOTE) Calculated using the CKD-EPI Creatinine Equation (2021)    Anion gap 10 5 - 15    Comment: Performed at [facility], [address] [place], [place] [address]  CBC     Status: None   Collection Time: 08/12/21  1:16 PM  Result Value Ref Range   WBC 9.2 4.0 - 10.5 K/uL   RBC 4.92 4.22 - 5.81 MIL/uL   Hemoglobin 13.6 13.0 - 17.0 g/dL   HCT 91.442.7 78.239.0 - 95.652.0 %   MCV 86.8 80.0 - 100.0 fL   MCH 27.6 26.0 - 34.0 pg   MCHC 31.9 30.0 - 36.0 g/dL   RDW 21.313.8 08.611.5 - 57.815.5 %   Platelets 233 150 - 400 K/uL   nRBC 0.0 0.0 - 0.2 %    Comment: Performed at [facility], [address] [place], [place] [address]  Troponin I (High Sensitivity)     Status: None   Collection Time: 08/12/21  1:16 PM  Result Value Ref Range   Troponin I (High Sensitivity) 3 <18 ng/L    Comment: (NOTE) Elevated high sensitivity troponin I (hsTnI) values and significant  changes across serial measurements may suggest ACS but many other  chronic and acute conditions are known to elevate hsTnI results.  Refer to the "Links" section for chest pain algorithms and additional  guidance. Performed at [facility], [address] [place], [place] [address]  Uric acid     Status: Abnormal   Collection Time: 08/12/21  2:34 PM  Result Value Ref Range   Uric Acid, Serum 10.0 (H) 3.7 - 8.6 mg/dL    Comment: Performed at [facility], [address] [place], [place] [address]  Troponin I (High Sensitivity)     Status: None   Collection Time: 08/12/21  5:43 PM  Result Value Ref Range   Troponin I (High  Sensitivity) 3 <18 ng/L    Comment: (NOTE) Elevated high sensitivity troponin I (hsTnI) values and significant  changes across serial measurements may suggest ACS but many other  chronic and acute conditions are known to elevate hsTnI results.  Refer to the "Links" section for chest pain algorithms and additional  guidance. Performed at [facility], [address] [place], [place] [address]   Magnesium     Status: None   Collection Time: 08/13/21 11:11 PM  Result Value Ref Range   Magnesium 2.0 1.7 - 2.4 mg/dL    Comment: Performed at [facility], [address] [place], [place] [address]  CBC with Differential/Platelet     Status: Abnormal   Collection Time: 08/13/21 11:11 PM  Result Value Ref Range   WBC 6.9 4.0 - 10.5 K/uL   RBC 5.05 4.22 - 5.81 MIL/uL   Hemoglobin 14.3 13.0 - 17.0 g/dL   HCT 21.3 08.6 - 57.8 %   MCV 86.5 80.0 - 100.0 fL   MCH 28.3 26.0 - 34.0 pg   MCHC 32.7 30.0 - 36.0 g/dL   RDW 46.9 62.9 - 52.8 %   Platelets 259 150 - 400 K/uL   nRBC 0.0 0.0 - 0.2 %   Neutrophils Relative % 59 %   Neutro Abs 4.1 1.7 - 7.7 K/uL   Lymphocytes Relative 22 %   Lymphs Abs 1.5 0.7 - 4.0 K/uL   Monocytes Relative 15 %   Monocytes Absolute 1.1 (H) 0.1 - 1.0 K/uL   Eosinophils Relative 1 %   Eosinophils Absolute 0.1 0.0 - 0.5 K/uL   Basophils Relative 1 %   Basophils Absolute 0.0 0.0 - 0.1 K/uL   Immature Granulocytes 2 %   Abs Immature Granulocytes 0.13 (H) 0.00 - 0.07 K/uL    Comment: Performed at [facility], [address] [place], [place] [address]  Comprehensive metabolic panel     Status: Abnormal   Collection Time: 08/13/21 11:11 PM  Result Value Ref Range   Sodium 138 135 - 145 mmol/L   Potassium 3.6 3.5 - 5.1 mmol/L   Chloride 93 (L) 98 - 111 mmol/L   CO2 31 22 - 32 mmol/L   Glucose, Bld 128 (H) 70 - 99 mg/dL    Comment: Glucose reference range applies only to samples taken after fasting for at least 8 hours.   BUN 13 6 - 20  mg/dL   Creatinine, Ser 4.01 0.61 - 1.24 mg/dL   Calcium 9.2 8.9 - 02.7 mg/dL   Total Protein 8.1 6.5 - 8.1 g/dL   Albumin 3.6 3.5 - 5.0 g/dL   AST 19 15 - 41 U/L   ALT 13 0 - 44 U/L   Alkaline Phosphatase 63 38 - 126 U/L   Total Bilirubin 0.6 0.3 - 1.2 mg/dL   GFR, Estimated >25 >36 mL/min    Comment: (NOTE) Calculated using the CKD-EPI Creatinine Equation (2021)    Anion gap 14 5 - 15    Comment: Performed at [facility], [address] [place], [place] [address]  Troponin I (High Sensitivity)     Status: None   Collection Time: 08/13/21 11:11 PM  Result Value Ref Range   Troponin I (High Sensitivity) 5 <18 ng/L    Comment: (NOTE) Elevated high sensitivity troponin I (hsTnI) values and significant  changes across serial measurements may suggest ACS but many other  chronic and acute conditions are known to elevate hsTnI results.  Refer to the "Links" section for chest pain algorithms and additional  guidance. Performed at [facility], [address] [place], [place] [address]   Brain natriuretic peptide     Status: Abnormal   Collection Time: 08/13/21 11:25 PM  Result Value Ref Range   B Natriuretic Peptide 235.4 (H) 0.0 - 100.0 pg/mL    Comment: Performed at [facility], [address] [place], [place] [address]  Resp Panel by RT-PCR (Flu A&B, Covid) Nasopharyngeal Swab     Status: None   Collection Time: 08/13/21 11:25 PM   Specimen: Nasopharyngeal Swab; Nasopharyngeal(NP) swabs in vial transport medium  Result Value Ref Range   SARS Coronavirus 2 by RT PCR NEGATIVE NEGATIVE    Comment: (NOTE) SARS-CoV-2 target nucleic acids are NOT DETECTED.  The SARS-CoV-2 RNA is generally detectable in upper respiratory specimens during the acute phase of infection. The lowest concentration of SARS-CoV-2 viral copies this assay can detect is 138 copies/mL. A negative result does not preclude SARS-Cov-2 infection and should not be used as the sole  basis for treatment or other patient management decisions. A negative result may occur with  improper specimen collection/handling, submission of specimen other than nasopharyngeal swab, presence of viral mutation(s) within the areas targeted by this assay, and inadequate number of viral copies(<138 copies/mL). A negative result must be combined with clinical observations, patient history, and epidemiological information. The expected result is Negative.  Fact Sheet for Patients:  BloggerCourse.com  Fact Sheet for Healthcare Providers:  SeriousBroker.it  This test is no t yet approved or cleared by the [place] FDA and  has been authorized for detection and/or diagnosis of SARS-CoV-2 by FDA under an Emergency Use Authorization (EUA). This EUA will remain  in effect (meaning this test can be used) for the duration of the COVID-19 declaration under Section 564(b)(1) of the Act, 21 U.S.C.section 360bbb-3(b)(1), unless the authorization is terminated  or revoked sooner.       Influenza A by PCR NEGATIVE NEGATIVE   Influenza B by PCR NEGATIVE NEGATIVE    Comment: (NOTE) The Xpert Xpress SARS-CoV-2/FLU/RSV plus assay is intended as an aid in the diagnosis of influenza from Nasopharyngeal swab specimens and should not be used as a sole basis for treatment. Nasal washings and aspirates are unacceptable for Xpert Xpress SARS-CoV-2/FLU/RSV testing.  Fact Sheet for Patients: BloggerCourse.com  Fact Sheet for Healthcare Providers: SeriousBroker.it  This test is not yet approved or cleared by the [place] FDA and has been authorized for detection and/or diagnosis of SARS-CoV-2 by FDA under an Emergency Use Authorization (EUA). This EUA will remain in effect (meaning this test can be used) for the duration of the COVID-19 declaration under Section 564(b)(1) of the Act, 21  U.S.C. section 360bbb-3(b)(1), unless the authorization is terminated or revoked.  Performed at [facility], [address] [place], [place] [address]   Troponin I (High Sensitivity)     Status: None   Collection Time: 08/14/21  1:25 AM  Result Value Ref Range   Troponin I (High  Sensitivity) 7 <18 ng/L    Comment: (NOTE) Elevated high sensitivity troponin I (hsTnI) values and significant  changes across serial measurements may suggest ACS but many other  chronic and acute conditions are known to elevate hsTnI results.  Refer to the "Links" section for chest pain algorithms and additional  guidance. Performed at [facility], [address] [place], [place] [address]   Valproic acid level     Status: None   Collection Time: 08/14/21 12:25 PM  Result Value Ref Range   Valproic Acid Lvl 73 50.0 - 100.0 ug/mL    Comment: Performed at [facility], [address] [place], [place] [address]  Glucose, capillary     Status: Abnormal   Collection Time: 08/14/21  8:30 PM  Result Value Ref Range   Glucose-Capillary 196 (H) 70 - 99 mg/dL    Comment: Glucose reference range applies only to samples taken after fasting for at least 8 hours.  Basic metabolic panel     Status: Abnormal   Collection Time: 08/15/21  4:52 AM  Result Value Ref Range   Sodium 138 135 - 145 mmol/L   Potassium 4.3 3.5 - 5.1 mmol/L   Chloride 97 (L) 98 - 111 mmol/L   CO2 34 (H) 22 - 32 mmol/L   Glucose, Bld 157 (H) 70 - 99 mg/dL    Comment: Glucose reference range applies only to samples taken after fasting for at least 8 hours.   BUN 16 6 - 20 mg/dL   Creatinine, Ser 0.98 0.61 - 1.24 mg/dL   Calcium 9.0 8.9 - 11.9 mg/dL   GFR, Estimated >14 >78 mL/min    Comment: (NOTE) Calculated using the CKD-EPI Creatinine Equation (2021)    Anion gap 7 5 - 15    Comment: Performed at [facility], [address] [place], [place] [address]  CBC     Status: Abnormal   Collection  Time: 08/15/21  4:52 AM  Result Value Ref Range   WBC 7.6 4.0 - 10.5 K/uL   RBC 4.59 4.22 - 5.81 MIL/uL   Hemoglobin 12.7 (L) 13.0 - 17.0 g/dL   HCT 13.0 86.5 - 78.4 %   MCV 86.9 80.0 - 100.0 fL   MCH 27.7 26.0 - 34.0 pg   MCHC 31.8 30.0 - 36.0 g/dL   RDW 69.6 29.5 - 28.4 %   Platelets 266 150 - 400 K/uL   nRBC 0.0 0.0 - 0.2 %    Comment: Performed at [facility], [address], [place], [place] [address]  Magnesium     Status: None   Collection Time: 08/15/21  4:52 AM  Result Value Ref Range   Magnesium 2.0 1.7 - 2.4 mg/dL    Comment: Performed at [facility], [address] [place], [place] [address]  Basic metabolic panel     Status: Abnormal   Collection Time: 08/16/21  6:28 AM  Result Value Ref Range   Sodium 138 135 - 145 mmol/L   Potassium 4.2 3.5 - 5.1 mmol/L   Chloride 96 (L) 98 - 111 mmol/L   CO2 34 (H) 22 - 32 mmol/L   Glucose, Bld 144 (H) 70 - 99 mg/dL    Comment: Glucose reference range applies only to samples taken after fasting for at least 8 hours.   BUN 20 6 - 20 mg/dL   Creatinine, Ser 2.53 0.61 - 1.24 mg/dL   Calcium 8.9 8.9 - 66.4 mg/dL   GFR, Estimated >40 >  60 mL/min    Comment: (NOTE) Calculated using the CKD-EPI Creatinine Equation (2021)    Anion gap 8 5 - 15    Comment: Performed at [facility], [address] [place], [place] [address]  CBC     Status: None   Collection Time: 08/16/21  6:28 AM  Result Value Ref Range   WBC 10.5 4.0 - 10.5 K/uL   RBC 4.92 4.22 - 5.81 MIL/uL   Hemoglobin 13.8 13.0 - 17.0 g/dL   HCT 60.4 54.0 - 98.1 %   MCV 86.4 80.0 - 100.0 fL   MCH 28.0 26.0 - 34.0 pg   MCHC 32.5 30.0 - 36.0 g/dL   RDW 19.1 47.8 - 29.5 %   Platelets 284 150 - 400 K/uL   nRBC 0.0 0.0 - 0.2 %    Comment: Performed at [facility], [address], [place], [place] [address]  Magnesium     Status: None   Collection Time: 08/16/21  6:28 AM  Result Value Ref Range   Magnesium 2.0 1.7 - 2.4 mg/dL     Comment: Performed at [facility], [address] [place], [place] [address]  Comprehensive metabolic panel     Status: Abnormal   Collection Time: 08/20/21  4:35 PM  Result Value Ref Range   Sodium 140 135 - 145 mmol/L   Potassium 4.9 3.5 - 5.1 mmol/L   Chloride 98 98 - 111 mmol/L   CO2 30 22 - 32 mmol/L   Glucose, Bld 115 (H) 70 - 99 mg/dL    Comment: Glucose reference range applies only to samples taken after fasting for at least 8 hours.   BUN 26 (H) 6 - 20 mg/dL   Creatinine, Ser 4.69 0.61 - 1.24 mg/dL   Calcium 9.6 8.9 - 62.9 mg/dL   Total Protein 8.1 6.5 - 8.1 g/dL   Albumin 4.1 3.5 - 5.0 g/dL   AST 29 15 - 41 U/L   ALT 23 0 - 44 U/L   Alkaline Phosphatase 58 38 - 126 U/L   Total Bilirubin 0.8 0.3 - 1.2 mg/dL   GFR, Estimated >52 >84 mL/min    Comment: (NOTE) Calculated using the CKD-EPI Creatinine Equation (2021)    Anion gap 12 5 - 15    Comment: Performed at [facility], [address] [place], [place] [address]  Ethanol     Status: None   Collection Time: 08/20/21  4:35 PM  Result Value Ref Range   Alcohol, Ethyl (B) <10 <10 mg/dL    Comment: (NOTE) Lowest detectable limit for serum alcohol is 10 mg/dL.  For medical purposes only. Performed at [facility], [address] [place], [place] [address]   Salicylate level     Status: Abnormal   Collection Time: 08/20/21  4:35 PM  Result Value Ref Range   Salicylate Lvl <7.0 (L) 7.0 - 30.0 mg/dL    Comment: Performed at [facility], [address] [place], [place] [address]  Acetaminophen level     Status: Abnormal   Collection Time: 08/20/21  4:35 PM  Result Value Ref Range   Acetaminophen (Tylenol), Serum <10 (L) 10 - 30 ug/mL    Comment: (NOTE) Therapeutic concentrations vary significantly. A range of 10-30 ug/mL  may be an effective concentration for many patients. However, some  are best treated at concentrations outside of this range. Acetaminophen  concentrations >150 ug/mL at 4 hours after ingestion  and >50 ug/mL at 12  hours after ingestion are often associated with  toxic reactions.  Performed at [facility], [address] [place], [place] [address]   cbc     Status: Abnormal   Collection Time: 08/20/21  4:35 PM  Result Value Ref Range   WBC 11.6 (H) 4.0 - 10.5 K/uL   RBC 5.24 4.22 - 5.81 MIL/uL   Hemoglobin 14.9 13.0 - 17.0 g/dL   HCT 60.4 54.0 - 98.1 %   MCV 86.8 80.0 - 100.0 fL   MCH 28.4 26.0 - 34.0 pg   MCHC 32.7 30.0 - 36.0 g/dL   RDW 19.1 47.8 - 29.5 %   Platelets 284 150 - 400 K/uL   nRBC 0.0 0.0 - 0.2 %    Comment: Performed at [facility], [address], [place], [place] [address]  Urine Drug Screen, Qualitative     Status: Abnormal   Collection Time: 08/20/21  4:35 PM  Result Value Ref Range   Tricyclic, Ur Screen POSITIVE (A) NONE DETECTED   Amphetamines, Ur Screen NONE DETECTED NONE DETECTED   MDMA (Ecstasy)Ur Screen NONE DETECTED NONE DETECTED   Cocaine Metabolite,Ur [place] NONE DETECTED NONE DETECTED   Opiate, Ur Screen NONE DETECTED NONE DETECTED   Phencyclidine (PCP) Ur S NONE DETECTED NONE DETECTED   Cannabinoid 50 Ng, Ur [place] NONE DETECTED NONE DETECTED   Barbiturates, Ur Screen NONE DETECTED NONE DETECTED   Benzodiazepine, Ur Scrn NONE DETECTED NONE DETECTED   Methadone Scn, Ur NONE DETECTED NONE DETECTED    Comment: (NOTE) Tricyclics + metabolites, urine    Cutoff 1000 ng/mL Amphetamines + metabolites, urine  Cutoff 1000 ng/mL MDMA (Ecstasy), urine              Cutoff 500 ng/mL Cocaine Metabolite, urine          Cutoff 300 ng/mL Opiate + metabolites, urine        Cutoff 300 ng/mL Phencyclidine (PCP), urine         Cutoff 25 ng/mL Cannabinoid, urine                 Cutoff 50 ng/mL Barbiturates + metabolites, urine  Cutoff 200 ng/mL Benzodiazepine, urine              Cutoff 200 ng/mL Methadone, urine                   Cutoff 300 ng/mL  The urine drug screen provides only a  preliminary, unconfirmed analytical test result and should not be used for non-medical purposes. Clinical consideration and professional judgment should be applied to any positive drug screen result due to possible interfering substances. A more specific alternate chemical method must be used in order to obtain a confirmed analytical result. Gas chromatography / mass spectrometry (GC/MS) is the preferred confirm atory method. Performed at [facility], [address] [place], [place] [address]   Resp Panel by RT-PCR (Flu A&B, Covid) Nasopharyngeal Swab     Status: None   Collection Time: 08/20/21  5:15 PM   Specimen: Nasopharyngeal Swab; Nasopharyngeal(NP) swabs in vial transport medium  Result Value Ref Range   SARS Coronavirus 2 by RT PCR NEGATIVE NEGATIVE    Comment: (NOTE) SARS-CoV-2 target nucleic acids are NOT DETECTED.  The SARS-CoV-2 RNA is generally detectable in upper respiratory specimens during the acute phase of infection. The lowest concentration of SARS-CoV-2 viral copies this assay can detect is 138 copies/mL. A negative result does not preclude SARS-Cov-2 infection and should  not be used as the sole basis for treatment or other patient management decisions. A negative result may occur with  improper specimen collection/handling, submission of specimen other than nasopharyngeal swab, presence of viral mutation(s) within the areas targeted by this assay, and inadequate number of viral copies(<138 copies/mL). A negative result must be combined with clinical observations, patient history, and epidemiological information. The expected result is Negative.  Fact Sheet for Patients:  BloggerCourse.com  Fact Sheet for Healthcare Providers:  SeriousBroker.it  This test is no t yet approved or cleared by the [place] FDA and  has been authorized for detection and/or diagnosis of SARS-CoV-2 by FDA under an  Emergency Use Authorization (EUA). This EUA will remain  in effect (meaning this test can be used) for the duration of the COVID-19 declaration under Section 564(b)(1) of the Act, 21 U.S.C.section 360bbb-3(b)(1), unless the authorization is terminated  or revoked sooner.       Influenza A by PCR NEGATIVE NEGATIVE   Influenza B by PCR NEGATIVE NEGATIVE    Comment: (NOTE) The Xpert Xpress SARS-CoV-2/FLU/RSV plus assay is intended as an aid in the diagnosis of influenza from Nasopharyngeal swab specimens and should not be used as a sole basis for treatment. Nasal washings and aspirates are unacceptable for Xpert Xpress SARS-CoV-2/FLU/RSV testing.  Fact Sheet for Patients: BloggerCourse.com  Fact Sheet for Healthcare Providers: SeriousBroker.it  This test is not yet approved or cleared by the [place] FDA and has been authorized for detection and/or diagnosis of SARS-CoV-2 by FDA under an Emergency Use Authorization (EUA). This EUA will remain in effect (meaning this test can be used) for the duration of the COVID-19 declaration under Section 564(b)(1) of the Act, 21 U.S.C. section 360bbb-3(b)(1), unless the authorization is terminated or revoked.  Performed at [facility], [address] [place], [place] [address]   Valproic acid level     Status: Abnormal   Collection Time: 08/21/21  2:43 PM  Result Value Ref Range   Valproic Acid Lvl 20 (L) 50.0 - 100.0 ug/mL    Comment: Performed at [facility], [address] [place], [place] [address]  Comprehensive metabolic panel     Status: Abnormal   Collection Time: 08/27/21 11:17 PM  Result Value Ref Range   Sodium 137 135 - 145 mmol/L   Potassium 3.9 3.5 - 5.1 mmol/L   Chloride 99 98 - 111 mmol/L   CO2 29 22 - 32 mmol/L   Glucose, Bld 119 (H) 70 - 99 mg/dL    Comment: Glucose reference range applies only to samples taken after fasting for at least 8  hours.   BUN 13 6 - 20 mg/dL   Creatinine, Ser 0.98 0.61 - 1.24 mg/dL   Calcium 8.9 8.9 - 11.9 mg/dL   Total Protein 6.6 6.5 - 8.1 g/dL   Albumin 3.6 3.5 - 5.0 g/dL   AST 20 15 - 41 U/L   ALT 16 0 - 44 U/L   Alkaline Phosphatase 55 38 - 126 U/L   Total Bilirubin 0.7 0.3 - 1.2 mg/dL   GFR, Estimated >14 >78 mL/min    Comment: (NOTE) Calculated using the CKD-EPI Creatinine Equation (2021)    Anion gap 9 5 - 15    Comment: Performed at [facility], [address] [place], [place] [address]  CBC with Differential     Status: Abnormal   Collection Time: 08/27/21 11:17 PM  Result Value Ref Range   WBC 7.6 4.0 -  10.5 K/uL   RBC 4.59 4.22 - 5.81 MIL/uL   Hemoglobin 12.9 (L) 13.0 - 17.0 g/dL   HCT 16.1 09.6 - 04.5 %   MCV 86.9 80.0 - 100.0 fL   MCH 28.1 26.0 - 34.0 pg   MCHC 32.3 30.0 - 36.0 g/dL   RDW 40.9 81.1 - 91.4 %   Platelets 204 150 - 400 K/uL   nRBC 0.0 0.0 - 0.2 %   Neutrophils Relative % 56 %   Neutro Abs 4.3 1.7 - 7.7 K/uL   Lymphocytes Relative 29 %   Lymphs Abs 2.2 0.7 - 4.0 K/uL   Monocytes Relative 11 %   Monocytes Absolute 0.8 0.1 - 1.0 K/uL   Eosinophils Relative 2 %   Eosinophils Absolute 0.2 0.0 - 0.5 K/uL   Basophils Relative 1 %   Basophils Absolute 0.1 0.0 - 0.1 K/uL   Immature Granulocytes 1 %   Abs Immature Granulocytes 0.10 (H) 0.00 - 0.07 K/uL    Comment: Performed at [facility], [address], [place], [place] [address]  Troponin I (High Sensitivity)     Status: None   Collection Time: 08/27/21 11:17 PM  Result Value Ref Range   Troponin I (High Sensitivity) 4 <18 ng/L    Comment: (NOTE) Elevated high sensitivity troponin I (hsTnI) values and significant  changes across serial measurements may suggest ACS but many other  chronic and acute conditions are known to elevate hsTnI results.  Refer to the "Links" section for chest pain algorithms and additional  guidance. Performed at [facility], [address] [place], [place] [address]   Ethanol     Status: None   Collection Time: 08/27/21 11:17 PM  Result Value Ref Range   Alcohol, Ethyl (B) <10 <10 mg/dL    Comment: (NOTE) Lowest detectable limit for serum alcohol is 10 mg/dL.  For medical purposes only. Performed at [facility], [address] [place], [place] [address]   Resp Panel by RT-PCR (Flu A&B, Covid) Nasopharyngeal Swab     Status: None   Collection Time: 08/28/21  1:04 AM   Specimen: Nasopharyngeal Swab; Nasopharyngeal(NP) swabs in vial transport medium  Result Value Ref Range   SARS Coronavirus 2 by RT PCR NEGATIVE NEGATIVE    Comment: (NOTE) SARS-CoV-2 target nucleic acids are NOT DETECTED.  The SARS-CoV-2 RNA is generally detectable in upper respiratory specimens during the acute phase of infection. The lowest concentration of SARS-CoV-2 viral copies this assay can detect is 138 copies/mL. A negative result does not preclude SARS-Cov-2 infection and should not be used as the sole basis for treatment or other patient management decisions. A negative result may occur with  improper specimen collection/handling, submission of specimen other than nasopharyngeal swab, presence of viral mutation(s) within the areas targeted by this assay, and inadequate number of viral copies(<138 copies/mL). A negative result must be combined with clinical observations, patient history, and epidemiological information. The expected result is Negative.  Fact Sheet for Patients:  BloggerCourse.com  Fact Sheet for Healthcare Providers:  SeriousBroker.it  This test is no t yet approved or cleared by the [place] FDA and  has been authorized for detection and/or diagnosis of SARS-CoV-2 by FDA under an Emergency Use Authorization (EUA). This EUA will remain  in effect (meaning this test can be used) for the duration of the COVID-19 declaration under Section 564(b)(1) of the  Act, 21 U.S.C.section 360bbb-3(b)(1), unless the authorization is terminated  or revoked sooner.  Influenza A by PCR NEGATIVE NEGATIVE   Influenza B by PCR NEGATIVE NEGATIVE    Comment: (NOTE) The Xpert Xpress SARS-CoV-2/FLU/RSV plus assay is intended as an aid in the diagnosis of influenza from Nasopharyngeal swab specimens and should not be used as a sole basis for treatment. Nasal washings and aspirates are unacceptable for Xpert Xpress SARS-CoV-2/FLU/RSV testing.  Fact Sheet for Patients: BloggerCourse.com  Fact Sheet for Healthcare Providers: SeriousBroker.it  This test is not yet approved or cleared by the [place] FDA and has been authorized for detection and/or diagnosis of SARS-CoV-2 by FDA under an Emergency Use Authorization (EUA). This EUA will remain in effect (meaning this test can be used) for the duration of the COVID-19 declaration under Section 564(b)(1) of the Act, 21 U.S.C. section 360bbb-3(b)(1), unless the authorization is terminated or revoked.  Performed at [facility], [address] [place], [place] [address]   Type and screen [facility]     Status: None   Collection Time: 08/28/21  1:04 AM  Result Value Ref Range   ABO/RH(D) O POS    Antibody Screen NEG    Sample Expiration      08/31/2021,2359 Performed at [facility], [address] [place], [place] [address]   Urine Drug Screen, Qualitative     Status: Abnormal   Collection Time: 08/28/21  3:20 AM  Result Value Ref Range   Tricyclic, Ur Screen POSITIVE (A) NONE DETECTED   Amphetamines, Ur Screen NONE DETECTED NONE DETECTED   MDMA (Ecstasy)Ur Screen NONE DETECTED NONE DETECTED   Cocaine Metabolite,Ur [place] NONE DETECTED NONE DETECTED   Opiate, Ur Screen NONE DETECTED NONE DETECTED   Phencyclidine (PCP) Ur S NONE DETECTED NONE DETECTED   Cannabinoid 50 Ng, Ur [place] NONE DETECTED NONE DETECTED    Barbiturates, Ur Screen NONE DETECTED NONE DETECTED   Benzodiazepine, Ur Scrn NONE DETECTED NONE DETECTED   Methadone Scn, Ur NONE DETECTED NONE DETECTED    Comment: (NOTE) Tricyclics + metabolites, urine    Cutoff 1000 ng/mL Amphetamines + metabolites, urine  Cutoff 1000 ng/mL MDMA (Ecstasy), urine              Cutoff 500 ng/mL Cocaine Metabolite, urine          Cutoff 300 ng/mL Opiate + metabolites, urine        Cutoff 300 ng/mL Phencyclidine (PCP), urine         Cutoff 25 ng/mL Cannabinoid, urine                 Cutoff 50 ng/mL Barbiturates + metabolites, urine  Cutoff 200 ng/mL Benzodiazepine, urine              Cutoff 200 ng/mL Methadone, urine                   Cutoff 300 ng/mL  The urine drug screen provides only a preliminary, unconfirmed analytical test result and should not be used for non-medical purposes. Clinical consideration and professional judgment should be applied to any positive drug screen result due to possible interfering substances. A more specific alternate chemical method must be used in order to obtain a confirmed analytical result. Gas chromatography / mass spectrometry (GC/MS) is the preferred confirm atory method. Performed at [facility], [address] [place], [place] [address]   Urinalysis, Routine w reflex microscopic     Status: Abnormal   Collection Time: 08/28/21  3:20 AM  Result Value Ref Range  Color, Urine STRAW (A) YELLOW   APPearance CLEAR (A) CLEAR   Specific Gravity, Urine 1.043 (H) 1.005 - 1.030   pH 5.0 5.0 - 8.0   Glucose, UA NEGATIVE NEGATIVE mg/dL   Hgb urine dipstick NEGATIVE NEGATIVE   Bilirubin Urine NEGATIVE NEGATIVE   Ketones, ur NEGATIVE NEGATIVE mg/dL   Protein, ur NEGATIVE NEGATIVE mg/dL   Nitrite NEGATIVE NEGATIVE   Leukocytes,Ua MODERATE (A) NEGATIVE   RBC / HPF 0-5 0 - 5 RBC/hpf   WBC, UA 21-50 0 - 5 WBC/hpf   Bacteria, UA NONE SEEN NONE SEEN   Squamous Epithelial / LPF 0-5 0 - 5    Comment:  Performed at [facility], [address] [place], [place] [address]  Glucose, capillary     Status: Abnormal   Collection Time: 08/28/21  5:59 PM  Result Value Ref Range   Glucose-Capillary 107 (H) 70 - 99 mg/dL    Comment: Glucose reference range applies only to samples taken after fasting for at least 8 hours.  Valproic acid level     Status: None   Collection Time: 08/28/21  8:27 PM  Result Value Ref Range   Valproic Acid Lvl 88 50.0 - 100.0 ug/mL    Comment: Performed at [facility], [address] [place], [place] [address]  Hemoglobin A1c     Status: Abnormal   Collection Time: 08/29/21  5:02 AM  Result Value Ref Range   Hgb A1c MFr Bld 6.1 (H) 4.8 - 5.6 %    Comment: (NOTE) Pre diabetes:          5.7%-6.4%  Diabetes:              >6.4%  Glycemic control for   <7.0% adults with diabetes    Mean Plasma Glucose 128.37 mg/dL    Comment: Performed at [facility], [address], [place], [place] [address]  Lipid panel     Status: Abnormal   Collection Time: 08/29/21  5:02 AM  Result Value Ref Range   Cholesterol 133 0 - 200 mg/dL   Triglycerides 347 (H) <150 mg/dL   HDL 32 (L) >42 mg/dL   Total CHOL/HDL Ratio 4.2 RATIO   VLDL 51 (H) 0 - 40 mg/dL   LDL Cholesterol 50 0 - 99 mg/dL    Comment:        Total Cholesterol/HDL:CHD Risk Coronary Heart Disease Risk Table                     Men   Women  1/2 Average Risk   3.4   3.3  Average Risk       5.0   4.4  2 X Average Risk   9.6   7.1  3 X Average Risk  23.4   11.0        Use the calculated Patient Ratio above and the CHD Risk Table to determine the patient's CHD Risk.        ATP III CLASSIFICATION (LDL):  <100     mg/dL   Optimal  595-638  mg/dL   Near or Above                    Optimal  130-159  mg/dL   Borderline  756-433  mg/dL   High  >295     mg/dL   Very High Performed at [facility], [address] [place], [place] [address]   Urinalysis, Routine w  reflex  microscopic     Status: Abnormal   Collection Time: 08/31/21  2:13 PM  Result Value Ref Range   Color, Urine RED (A) YELLOW   APPearance CLOUDY (A) CLEAR   Specific Gravity, Urine 1.024 1.005 - 1.030   pH  5.0 - 8.0    TEST NOT REPORTED DUE TO COLOR INTERFERENCE OF URINE PIGMENT   Glucose, UA (A) NEGATIVE mg/dL    TEST NOT REPORTED DUE TO COLOR INTERFERENCE OF URINE PIGMENT   Hgb urine dipstick (A) NEGATIVE    TEST NOT REPORTED DUE TO COLOR INTERFERENCE OF URINE PIGMENT   Bilirubin Urine (A) NEGATIVE    TEST NOT REPORTED DUE TO COLOR INTERFERENCE OF URINE PIGMENT   Ketones, ur (A) NEGATIVE mg/dL    TEST NOT REPORTED DUE TO COLOR INTERFERENCE OF URINE PIGMENT   Protein, ur (A) NEGATIVE mg/dL    TEST NOT REPORTED DUE TO COLOR INTERFERENCE OF URINE PIGMENT   Nitrite (A) NEGATIVE    TEST NOT REPORTED DUE TO COLOR INTERFERENCE OF URINE PIGMENT   Leukocytes,Ua (A) NEGATIVE    TEST NOT REPORTED DUE TO COLOR INTERFERENCE OF URINE PIGMENT   RBC / HPF 11-20 0 - 5 RBC/hpf   WBC, UA >50 (H) 0 - 5 WBC/hpf   Bacteria, UA MANY (A) NONE SEEN   Squamous Epithelial / LPF 0-5 0 - 5   Mucus PRESENT    Hyaline Casts, UA PRESENT     Comment: Performed at [facility], [address] [place], [place] [address]  CBC with Differential     Status: Abnormal   Collection Time: 08/31/21  3:38 PM  Result Value Ref Range   WBC 9.3 4.0 - 10.5 K/uL   RBC 4.73 4.22 - 5.81 MIL/uL   Hemoglobin 13.2 13.0 - 17.0 g/dL   HCT 60.4 54.0 - 98.1 %   MCV 87.3 80.0 - 100.0 fL   MCH 27.9 26.0 - 34.0 pg   MCHC 32.0 30.0 - 36.0 g/dL   RDW 19.1 47.8 - 29.5 %   Platelets 192 150 - 400 K/uL   nRBC 0.0 0.0 - 0.2 %   Neutrophils Relative % 68 %   Neutro Abs 6.3 1.7 - 7.7 K/uL   Lymphocytes Relative 19 %   Lymphs Abs 1.7 0.7 - 4.0 K/uL   Monocytes Relative 10 %   Monocytes Absolute 0.9 0.1 - 1.0 K/uL   Eosinophils Relative 2 %   Eosinophils Absolute 0.2 0.0 - 0.5 K/uL   Basophils Relative 0 %   Basophils  Absolute 0.0 0.0 - 0.1 K/uL   Immature Granulocytes 1 %   Abs Immature Granulocytes 0.09 (H) 0.00 - 0.07 K/uL    Comment: Performed at [facility], [address] [place], [place] [address]  Comprehensive metabolic panel     Status: Abnormal   Collection Time: 08/31/21  3:38 PM  Result Value Ref Range   Sodium 137 135 - 145 mmol/L   Potassium 4.0 3.5 - 5.1 mmol/L   Chloride 96 (L) 98 - 111 mmol/L   CO2 31 22 - 32 mmol/L   Glucose, Bld 92 70 - 99 mg/dL    Comment: Glucose reference range applies only to samples taken after fasting for at least 8 hours.   BUN 14 6 - 20 mg/dL   Creatinine, Ser 8.65 0.61 - 1.24 mg/dL   Calcium 9.2 8.9 - 78.4 mg/dL   Total Protein 7.2 6.5 - 8.1 g/dL   Albumin 3.7  3.5 - 5.0 g/dL   AST 22 15 - 41 U/L   ALT 18 0 - 44 U/L   Alkaline Phosphatase 56 38 - 126 U/L   Total Bilirubin 0.8 0.3 - 1.2 mg/dL   GFR, Estimated >16 >10 mL/min    Comment: (NOTE) Calculated using the CKD-EPI Creatinine Equation (2021)    Anion gap 10 5 - 15    Comment: Performed at [facility], [address] [place], [place] [address]  Urine Culture     Status: Abnormal   Collection Time: 08/31/21  5:28 PM   Specimen: Urine, Random  Result Value Ref Range   Specimen Description      URINE, RANDOM Performed at [facility], [address] [place], [place] [address]    Special Requests      NONE Performed at [facility], [address] [place], [place] [address]    Culture (A)     40,000 COLONIES/mL DIPHTHEROIDS(CORYNEBACTERIUM SPECIES) Standardized susceptibility testing for this organism is not available. 20,000 COLONIES/mL STAPHYLOCOCCUS SIMULANS    Report Status 09/03/2021 FINAL    Organism ID, Bacteria STAPHYLOCOCCUS SIMULANS (A)       Susceptibility   Staphylococcus simulans - MIC*    CIPROFLOXACIN <=0.5 SENSITIVE Sensitive     GENTAMICIN <=0.5 SENSITIVE Sensitive     NITROFURANTOIN <=16 SENSITIVE Sensitive     OXACILLIN  <=0.25 SENSITIVE Sensitive     TETRACYCLINE <=1 SENSITIVE Sensitive     VANCOMYCIN <=0.5 SENSITIVE Sensitive     TRIMETH/SULFA <=10 SENSITIVE Sensitive     CLINDAMYCIN >=8 RESISTANT Resistant     RIFAMPIN <=0.5 SENSITIVE Sensitive     Inducible Clindamycin NEGATIVE Sensitive     * 20,000 COLONIES/mL STAPHYLOCOCCUS SIMULANS  Basic metabolic panel     Status: Abnormal   Collection Time: 09/04/21 11:51 AM  Result Value Ref Range   Sodium 137 135 - 145 mmol/L   Potassium 4.2 3.5 - 5.1 mmol/L   Chloride 97 (L) 98 - 111 mmol/L   CO2 31 22 - 32 mmol/L   Glucose, Bld 182 (H) 70 - 99 mg/dL    Comment: Glucose reference range applies only to samples taken after fasting for at least 8 hours.   BUN 14 6 - 20 mg/dL   Creatinine, Ser 8.29 0.61 - 1.24 mg/dL   Calcium 9.1 8.9 - 56.2 mg/dL   GFR, Estimated >13 >08 mL/min    Comment: (NOTE) Calculated using the CKD-EPI Creatinine Equation (2021)    Anion gap 9 5 - 15    Comment: Performed at [facility], [address] [place], [place] [address]  CBC     Status: None   Collection Time: 09/04/21 11:51 AM  Result Value Ref Range   WBC 6.6 4.0 - 10.5 K/uL   RBC 4.94 4.22 - 5.81 MIL/uL   Hemoglobin 13.9 13.0 - 17.0 g/dL   HCT 69.6 29.5 - 28.4 %   MCV 86.6 80.0 - 100.0 fL   MCH 28.1 26.0 - 34.0 pg   MCHC 32.5 30.0 - 36.0 g/dL   RDW 13.2 44.0 - 10.2 %   Platelets 161 150 - 400 K/uL   nRBC 0.0 0.0 - 0.2 %    Comment: Performed at [facility], [address] [place], [place] [address]  Troponin I (High Sensitivity)     Status: None   Collection Time: 09/04/21 11:51 AM  Result Value Ref Range   Troponin I (High Sensitivity) 4 <18 ng/L  Comment: (NOTE) Elevated high sensitivity troponin I (hsTnI) values and significant  changes across serial measurements may suggest ACS but many other  chronic and acute conditions are known to elevate hsTnI results.  Refer to the "Links" section for chest pain algorithms and additional   guidance. Performed at [facility], [address] [place], [place] [address]   Troponin I (High Sensitivity)     Status: None   Collection Time: 09/04/21  2:22 PM  Result Value Ref Range   Troponin I (High Sensitivity) 4 <18 ng/L    Comment: (NOTE) Elevated high sensitivity troponin I (hsTnI) values and significant  changes across serial measurements may suggest ACS but many other  chronic and acute conditions are known to elevate hsTnI results.  Refer to the "Links" section for chest pain algorithms and additional  guidance. Performed at [facility], [address] [place], [place] [address]   Urinalysis, Routine w reflex microscopic Urine, Clean Catch     Status: Abnormal   Collection Time: 09/04/21  5:51 PM  Result Value Ref Range   Color, Urine YELLOW (A) YELLOW   APPearance CLEAR (A) CLEAR   Specific Gravity, Urine 1.020 1.005 - 1.030   pH 7.0 5.0 - 8.0   Glucose, UA NEGATIVE NEGATIVE mg/dL   Hgb urine dipstick NEGATIVE NEGATIVE   Bilirubin Urine NEGATIVE NEGATIVE   Ketones, ur NEGATIVE NEGATIVE mg/dL   Protein, ur NEGATIVE NEGATIVE mg/dL   Nitrite NEGATIVE NEGATIVE   Leukocytes,Ua NEGATIVE NEGATIVE    Comment: Performed at [facility], [address] [place], [place] [address]  Glucose, capillary     Status: Abnormal   Collection Time: 09/04/21  8:15 PM  Result Value Ref Range   Glucose-Capillary 101 (H) 70 - 99 mg/dL    Comment: Glucose reference range applies only to samples taken after fasting for at least 8 hours.  Hemoglobin A1c     Status: Abnormal   Collection Time: 09/04/21  8:17 PM  Result Value Ref Range   Hgb A1c MFr Bld 6.2 (H) 4.8 - 5.6 %    Comment: (NOTE) Pre diabetes:          5.7%-6.4%  Diabetes:              >6.4%  Glycemic control for   <7.0% adults with diabetes    Mean Plasma Glucose 131.24 mg/dL    Comment: Performed at [facility], [address], [place], [place] [address]  Glucose,  capillary     Status: Abnormal   Collection Time: 09/05/21  1:13 AM  Result Value Ref Range   Glucose-Capillary 140 (H) 70 - 99 mg/dL    Comment: Glucose reference range applies only to samples taken after fasting for at least 8 hours.  Basic metabolic panel     Status: Abnormal   Collection Time: 09/05/21  2:16 AM  Result Value Ref Range   Sodium 135 135 - 145 mmol/L   Potassium 3.9 3.5 - 5.1 mmol/L   Chloride 96 (L) 98 - 111 mmol/L   CO2 30 22 - 32 mmol/L   Glucose, Bld 126 (H) 70 - 99 mg/dL    Comment: Glucose reference range applies only to samples taken after fasting for at least 8 hours.   BUN 15 6 - 20 mg/dL   Creatinine, Ser 2.95 0.61 - 1.24 mg/dL   Calcium 8.9 8.9 - 62.1 mg/dL   GFR, Estimated >30 >86 mL/min    Comment: (NOTE) Calculated  using the CKD-EPI Creatinine Equation (2021)    Anion gap 9 5 - 15    Comment: Performed at [facility], [address] [place], [place] [address]  CBC     Status: Abnormal   Collection Time: 09/05/21  2:16 AM  Result Value Ref Range   WBC 7.1 4.0 - 10.5 K/uL   RBC 4.70 4.22 - 5.81 MIL/uL   Hemoglobin 13.4 13.0 - 17.0 g/dL   HCT 47.8 29.5 - 62.1 %   MCV 87.0 80.0 - 100.0 fL   MCH 28.5 26.0 - 34.0 pg   MCHC 32.8 30.0 - 36.0 g/dL   RDW 30.8 65.7 - 84.6 %   Platelets 145 (L) 150 - 400 K/uL   nRBC 0.0 0.0 - 0.2 %    Comment: Performed at [facility], [address] [place], [place] [address]  Protime-INR     Status: None   Collection Time: 09/05/21  2:16 AM  Result Value Ref Range   Prothrombin Time 14.0 11.4 - 15.2 seconds   INR 1.1 0.8 - 1.2    Comment: (NOTE) INR goal varies based on device and disease states. Performed at [facility], [address] [place], [place] [address]   Valproic acid level     Status: None   Collection Time: 09/05/21  4:28 AM  Result Value Ref Range   Valproic Acid Lvl 100 50.0 - 100.0 ug/mL    Comment: Performed at [facility], [address]  [place], [place] [address]  Comprehensive metabolic panel     Status: Abnormal   Collection Time: 09/05/21  6:30 AM  Result Value Ref Range   Sodium 133 (L) 135 - 145 mmol/L   Potassium 4.7 3.5 - 5.1 mmol/L   Chloride 95 (L) 98 - 111 mmol/L   CO2 28 22 - 32 mmol/L   Glucose, Bld 136 (H) 70 - 99 mg/dL    Comment: Glucose reference range applies only to samples taken after fasting for at least 8 hours.   BUN 17 6 - 20 mg/dL   Creatinine, Ser 0.27 0.61 - 1.24 mg/dL   Calcium 9.3 8.9 - 25.3 mg/dL   Total Protein 7.1 6.5 - 8.1 g/dL   Albumin 3.5 3.5 - 5.0 g/dL   AST 32 15 - 41 U/L   ALT 18 0 - 44 U/L   Alkaline Phosphatase 53 38 - 126 U/L   Total Bilirubin 0.7 0.3 - 1.2 mg/dL   GFR, Estimated >66 >44 mL/min    Comment: (NOTE) Calculated using the CKD-EPI Creatinine Equation (2021)    Anion gap 10 5 - 15    Comment: Performed at [facility], [address] [place], [place] [address]  Glucose, capillary     Status: Abnormal   Collection Time: 09/05/21  7:46 AM  Result Value Ref Range   Glucose-Capillary 113 (H) 70 - 99 mg/dL    Comment: Glucose reference range applies only to samples taken after fasting for at least 8 hours.  Glucose, capillary     Status: Abnormal   Collection Time: 09/05/21 12:32 PM  Result Value Ref Range   Glucose-Capillary 161 (H) 70 - 99 mg/dL    Comment: Glucose reference range applies only to samples taken after fasting for at least 8 hours.  Glucose, capillary     Status: Abnormal   Collection Time: 09/05/21  3:27 PM  Result Value Ref Range   Glucose-Capillary 148 (H) 70 - 99 mg/dL  Comment: Glucose reference range applies only to samples taken after fasting for at least 8 hours.  Glucose, capillary     Status: Abnormal   Collection Time: 09/05/21  8:48 PM  Result Value Ref Range   Glucose-Capillary 129 (H) 70 - 99 mg/dL    Comment: Glucose reference range applies only to samples taken after fasting for at least 8 hours.  Basic metabolic panel      Status: Abnormal   Collection Time: 09/06/21  4:38 AM  Result Value Ref Range   Sodium 138 135 - 145 mmol/L   Potassium 4.1 3.5 - 5.1 mmol/L   Chloride 96 (L) 98 - 111 mmol/L   CO2 32 22 - 32 mmol/L   Glucose, Bld 110 (H) 70 - 99 mg/dL    Comment: Glucose reference range applies only to samples taken after fasting for at least 8 hours.   BUN 18 6 - 20 mg/dL   Creatinine, Ser 1.61 0.61 - 1.24 mg/dL   Calcium 8.9 8.9 - 09.6 mg/dL   GFR, Estimated >04 >54 mL/min    Comment: (NOTE) Calculated using the CKD-EPI Creatinine Equation (2021)    Anion gap 10 5 - 15    Comment: Performed at [facility], [address] [place], [place] [address]  Glucose, capillary     Status: Abnormal   Collection Time: 09/06/21  7:48 AM  Result Value Ref Range   Glucose-Capillary 111 (H) 70 - 99 mg/dL    Comment: Glucose reference range applies only to samples taken after fasting for at least 8 hours.  Glucose, capillary     Status: Abnormal   Collection Time: 09/06/21 12:29 PM  Result Value Ref Range   Glucose-Capillary 204 (H) 70 - 99 mg/dL    Comment: Glucose reference range applies only to samples taken after fasting for at least 8 hours.  Glucose, capillary     Status: Abnormal   Collection Time: 09/06/21  4:46 PM  Result Value Ref Range   Glucose-Capillary 102 (H) 70 - 99 mg/dL    Comment: Glucose reference range applies only to samples taken after fasting for at least 8 hours.  Glucose, capillary     Status: Abnormal   Collection Time: 09/06/21  8:39 PM  Result Value Ref Range   Glucose-Capillary 143 (H) 70 - 99 mg/dL    Comment: Glucose reference range applies only to samples taken after fasting for at least 8 hours.  Urine Culture     Status: None   Collection Time: 09/07/21  6:50 AM   Specimen: Urine, Clean Catch  Result Value Ref Range   Specimen Description      URINE, CLEAN CATCH Performed at [facility], [address] [place], [place] [address]    Special  Requests      NONE Performed at [facility], [address] [place], [place] [address]    Culture      NO GROWTH Performed at [facility], [address], [place], [place] [address]    Report Status 09/08/2021 FINAL   Glucose, capillary     Status: Abnormal   Collection Time: 09/07/21  7:46 AM  Result Value Ref Range   Glucose-Capillary 102 (H) 70 - 99 mg/dL    Comment: Glucose reference range applies only to samples taken after fasting for at least 8 hours.  Glucose, capillary     Status: None   Collection Time: 09/07/21 12:50 PM  Result Value Ref Range  Glucose-Capillary 74 70 - 99 mg/dL    Comment: Glucose reference range applies only to samples taken after fasting for at least 8 hours.  Glucose, capillary     Status: None   Collection Time: 09/07/21  2:53 PM  Result Value Ref Range   Glucose-Capillary 83 70 - 99 mg/dL    Comment: Glucose reference range applies only to samples taken after fasting for at least 8 hours.  Urine Culture     Status: None   Collection Time: 09/07/21  4:26 PM   Specimen: Urine, Cystoscope  Result Value Ref Range   Specimen Description      URINE, CATHETERIZED Performed at [facility], [address], [place], [place] [address]    Special Requests      NONE Performed at [facility], [address] [place], [place] [address]    Culture      NO GROWTH Performed at [facility], [address], [place], [place] [address]    Report Status 09/09/2021 FINAL   Glucose, capillary     Status: Abnormal   Collection Time: 09/07/21  4:42 PM  Result Value Ref Range   Glucose-Capillary 182 (H) 70 - 99 mg/dL    Comment: Glucose reference range applies only to samples taken after fasting for at least 8 hours.  Glucose, capillary     Status: Abnormal   Collection Time: 09/07/21  9:37 PM  Result Value Ref Range   Glucose-Capillary 258 (H) 70 - 99 mg/dL    Comment: Glucose reference range applies only to  samples taken after fasting for at least 8 hours.  CBC     Status: None   Collection Time: 09/08/21  4:35 AM  Result Value Ref Range   WBC 7.2 4.0 - 10.5 K/uL   RBC 4.66 4.22 - 5.81 MIL/uL   Hemoglobin 13.3 13.0 - 17.0 g/dL   HCT 91.4 78.2 - 95.6 %   MCV 87.6 80.0 - 100.0 fL   MCH 28.5 26.0 - 34.0 pg   MCHC 32.6 30.0 - 36.0 g/dL   RDW 21.3 08.6 - 57.8 %   Platelets 157 150 - 400 K/uL   nRBC 0.0 0.0 - 0.2 %    Comment: Performed at [facility], [address] [place], [place] [address]  Basic metabolic panel     Status: Abnormal   Collection Time: 09/08/21  4:35 AM  Result Value Ref Range   Sodium 134 (L) 135 - 145 mmol/L   Potassium 6.6 (HH) 3.5 - 5.1 mmol/L    Comment: CRITICAL RESULT CALLED TO, READ BACK BY AND VERIFIED WITH MARTHA HARRIS@[email or web address]  09/08/21 RH    Chloride 95 (L) 98 - 111 mmol/L   CO2 29 22 - 32 mmol/L   Glucose, Bld 180 (H) 70 - 99 mg/dL    Comment: Glucose reference range applies only to samples taken after fasting for at least 8 hours.   BUN 19 6 - 20 mg/dL   Creatinine, Ser 9.52 0.61 - 1.24 mg/dL   Calcium 9.4 8.9 - 84.1 mg/dL   GFR, Estimated >32 >44 mL/min    Comment: (NOTE) Calculated using the CKD-EPI Creatinine Equation (2021)    Anion gap 10 5 - 15    Comment: Performed at [facility], [address] [place], [place] [address]  Glucose, capillary     Status: Abnormal   Collection Time: 09/08/21  8:36 AM  Result Value Ref Range   Glucose-Capillary 178 (H) 70 -  99 mg/dL    Comment: Glucose reference range applies only to samples taken after fasting for at least 8 hours.  Potassium     Status: Abnormal   Collection Time: 09/08/21  8:51 AM  Result Value Ref Range   Potassium 5.3 (H) 3.5 - 5.1 mmol/L    Comment: Performed at [facility], [address] [place], [place] [address]  Magnesium     Status: None   Collection Time: 09/08/21  8:51 AM  Result Value Ref Range   Magnesium 1.8 1.7 - 2.4 mg/dL    Comment:  Performed at [facility], [address] [place], [place] [address]  Glucose, capillary     Status: Abnormal   Collection Time: 09/08/21  9:55 AM  Result Value Ref Range   Glucose-Capillary 204 (H) 70 - 99 mg/dL    Comment: Glucose reference range applies only to samples taken after fasting for at least 8 hours.  MRSA Next Gen by PCR, Nasal     Status: None   Collection Time: 09/08/21  9:58 AM   Specimen: Nasal Mucosa; Nasal Swab  Result Value Ref Range   MRSA by PCR Next Gen NOT DETECTED NOT DETECTED    Comment: (NOTE) The GeneXpert MRSA Assay (FDA approved for NASAL specimens only), is one component of a comprehensive MRSA colonization surveillance program. It is not intended to diagnose MRSA infection nor to guide or monitor treatment for MRSA infections. Test performance is not FDA approved in patients less than 42 years old. Performed at [facility], [address] [place], [place] [address]   Glucose, capillary     Status: Abnormal   Collection Time: 09/08/21 11:41 AM  Result Value Ref Range   Glucose-Capillary 192 (H) 70 - 99 mg/dL    Comment: Glucose reference range applies only to samples taken after fasting for at least 8 hours.  Glucose, capillary     Status: Abnormal   Collection Time: 09/08/21  5:24 PM  Result Value Ref Range   Glucose-Capillary 189 (H) 70 - 99 mg/dL    Comment: Glucose reference range applies only to samples taken after fasting for at least 8 hours.  Glucose, capillary     Status: Abnormal   Collection Time: 09/08/21  9:38 PM  Result Value Ref Range   Glucose-Capillary 159 (H) 70 - 99 mg/dL    Comment: Glucose reference range applies only to samples taken after fasting for at least 8 hours.  Basic metabolic panel     Status: Abnormal   Collection Time: 09/09/21  5:54 AM  Result Value Ref Range   Sodium 136 135 - 145 mmol/L   Potassium 5.2 (H) 3.5 - 5.1 mmol/L   Chloride 98 98 - 111 mmol/L   CO2 33 (H) 22 - 32 mmol/L    Glucose, Bld 158 (H) 70 - 99 mg/dL    Comment: Glucose reference range applies only to samples taken after fasting for at least 8 hours.   BUN 31 (H) 6 - 20 mg/dL   Creatinine, Ser 9.14 0.61 - 1.24 mg/dL   Calcium 8.9 8.9 - 78.2 mg/dL   GFR, Estimated >95 >62 mL/min    Comment: (NOTE) Calculated using the CKD-EPI Creatinine Equation (2021)    Anion gap 5 5 - 15    Comment: Performed at [facility], [address] [place], [place] [address]  Glucose, capillary     Status: Abnormal   Collection Time: 09/09/21  7:52 AM  Result  Value Ref Range   Glucose-Capillary 146 (H) 70 - 99 mg/dL    Comment: Glucose reference range applies only to samples taken after fasting for at least 8 hours.  Glucose, capillary     Status: Abnormal   Collection Time: 09/09/21 11:29 AM  Result Value Ref Range   Glucose-Capillary 178 (H) 70 - 99 mg/dL    Comment: Glucose reference range applies only to samples taken after fasting for at least 8 hours.  CBC     Status: Abnormal   Collection Time: 09/12/21  1:33 PM  Result Value Ref Range   WBC 8.9 4.0 - 10.5 K/uL   RBC 4.53 4.22 - 5.81 MIL/uL   Hemoglobin 12.7 (L) 13.0 - 17.0 g/dL   HCT 40.9 81.1 - 91.4 %   MCV 87.4 80.0 - 100.0 fL   MCH 28.0 26.0 - 34.0 pg   MCHC 32.1 30.0 - 36.0 g/dL   RDW 78.2 95.6 - 21.3 %   Platelets 205 150 - 400 K/uL   nRBC 0.2 0.0 - 0.2 %    Comment: Performed at [facility], [address] [place], [place] [address]  Comprehensive metabolic panel     Status: Abnormal   Collection Time: 09/12/21  1:33 PM  Result Value Ref Range   Sodium 138 135 - 145 mmol/L   Potassium 4.6 3.5 - 5.1 mmol/L   Chloride 91 (L) 98 - 111 mmol/L   CO2 35 (H) 22 - 32 mmol/L   Glucose, Bld 87 70 - 99 mg/dL    Comment: Glucose reference range applies only to samples taken after fasting for at least 8 hours.   BUN 19 6 - 20 mg/dL   Creatinine, Ser 8.46 0.61 - 1.24 mg/dL   Calcium 9.0 8.9 - 96.2 mg/dL   Total Protein 6.2 (L) 6.5  - 8.1 g/dL   Albumin 3.4 (L) 3.5 - 5.0 g/dL   AST 15 15 - 41 U/L   ALT 12 0 - 44 U/L   Alkaline Phosphatase 44 38 - 126 U/L   Total Bilirubin 0.6 0.3 - 1.2 mg/dL   GFR, Estimated >95 >28 mL/min    Comment: (NOTE) Calculated using the CKD-EPI Creatinine Equation (2021)    Anion gap 12 5 - 15    Comment: Performed at [facility], [address] [place], [place] [address]  Lipase, blood     Status: None   Collection Time: 09/12/21  1:33 PM  Result Value Ref Range   Lipase 34 11 - 51 U/L    Comment: Performed at [facility], [address], [place], [place] [address]  Troponin I (High Sensitivity)     Status: None   Collection Time: 09/12/21  1:33 PM  Result Value Ref Range   Troponin I (High Sensitivity) 4 <18 ng/L    Comment: (NOTE) Elevated high sensitivity troponin I (hsTnI) values and significant  changes across serial measurements may suggest ACS but many other  chronic and acute conditions are known to elevate hsTnI results.  Refer to the "Links" section for chest pain algorithms and additional  guidance. Performed at [facility], [address] [place], [place] [address]   Digoxin level     Status: Abnormal   Collection Time: 09/12/21  1:33 PM  Result Value Ref Range   Digoxin Level 0.3 (L) 0.8 - 2.0 ng/mL    Comment: Performed at [facility], [address] [place], [place] [address]  Urinalysis, Routine w  reflex microscopic     Status: Abnormal   Collection Time: 09/12/21  2:48 PM  Result Value Ref Range   Color, Urine YELLOW (A) YELLOW   APPearance CLEAR (A) CLEAR   Specific Gravity, Urine 1.010 1.005 - 1.030   pH 7.0 5.0 - 8.0   Glucose, UA NEGATIVE NEGATIVE mg/dL   Hgb urine dipstick LARGE (A) NEGATIVE   Bilirubin Urine NEGATIVE NEGATIVE   Ketones, ur NEGATIVE NEGATIVE mg/dL   Protein, ur NEGATIVE NEGATIVE mg/dL   Nitrite NEGATIVE NEGATIVE   Leukocytes,Ua NEGATIVE NEGATIVE   RBC / HPF >50 (H) 0 - 5 RBC/hpf    WBC, UA 6-10 0 - 5 WBC/hpf   Bacteria, UA RARE (A) NONE SEEN   Squamous Epithelial / LPF 0-5 0 - 5   Hyaline Casts, UA PRESENT     Comment: Performed at [facility], [address] [place], [place] [address]  Urine Culture     Status: None   Collection Time: 09/12/21  2:48 PM   Specimen: Urine, Catheterized  Result Value Ref Range   Specimen Description      URINE, CATHETERIZED Performed at [facility], [address] [place], [place] [address]    Special Requests      NONE Performed at [facility], [address] [place], [place] [address]    Culture      NO GROWTH Performed at [facility], [address], [place], [place] [address]    Report Status 09/13/2021 FINAL   Troponin I (High Sensitivity)     Status: None   Collection Time: 09/12/21  4:40 PM  Result Value Ref Range   Troponin I (High Sensitivity) 4 <18 ng/L    Comment: (NOTE) Elevated high sensitivity troponin I (hsTnI) values and significant  changes across serial measurements may suggest ACS but many other  chronic and acute conditions are known to elevate hsTnI results.  Refer to the "Links" section for chest pain algorithms and additional  guidance. Performed at [facility], [address] [place], [place] [address]   Basic metabolic panel     Status: Abnormal   Collection Time: 09/13/21 12:19 PM  Result Value Ref Range   Sodium 135 135 - 145 mmol/L   Potassium 4.7 3.5 - 5.1 mmol/L   Chloride 97 (L) 98 - 111 mmol/L   CO2 29 22 - 32 mmol/L   Glucose, Bld 113 (H) 70 - 99 mg/dL    Comment: Glucose reference range applies only to samples taken after fasting for at least 8 hours.   BUN 20 6 - 20 mg/dL   Creatinine, Ser 1.30 0.61 - 1.24 mg/dL   Calcium 9.2 8.9 - 86.5 mg/dL   GFR, Estimated >78 >46 mL/min    Comment: (NOTE) Calculated using the CKD-EPI Creatinine Equation (2021)    Anion gap 9 5 - 15    Comment: Performed at [facility], [address] [place], [place] [address]  CBC     Status: None   Collection Time: 09/13/21 12:19 PM  Result Value Ref Range   WBC 8.2 4.0 - 10.5 K/uL   RBC 5.20 4.22 - 5.81 MIL/uL   Hemoglobin 14.8 13.0 - 17.0 g/dL   HCT 28.4 13.2 - 44.0 %   MCV 88.1 80.0 - 100.0 fL   MCH 28.5 26.0 - 34.0 pg   MCHC 32.3 30.0 - 36.0 g/dL   RDW 10.2 72.5 - 36.6 %   Platelets 237  150 - 400 K/uL   nRBC 0.0 0.0 - 0.2 %    Comment: Performed at [facility], [address] [place], [place] [address]  Troponin I (High Sensitivity)     Status: None   Collection Time: 09/13/21 12:19 PM  Result Value Ref Range   Troponin I (High Sensitivity) 3 <18 ng/L    Comment: (NOTE) Elevated high sensitivity troponin I (hsTnI) values and significant  changes across serial measurements may suggest ACS but many other  chronic and acute conditions are known to elevate hsTnI results.  Refer to the "Links" section for chest pain algorithms and additional  guidance. Performed at [facility], [address] [place], [place] [address]   Urinalysis, Routine w reflex microscopic Urine, Clean Catch     Status: Abnormal   Collection Time: 09/15/21  8:02 PM  Result Value Ref Range   Color, Urine AMBER (A) YELLOW    Comment: BIOCHEMICALS MAY BE AFFECTED BY COLOR   APPearance HAZY (A) CLEAR   Specific Gravity, Urine 1.014 1.005 - 1.030   pH 6.0 5.0 - 8.0   Glucose, UA NEGATIVE NEGATIVE mg/dL   Hgb urine dipstick LARGE (A) NEGATIVE   Bilirubin Urine NEGATIVE NEGATIVE   Ketones, ur NEGATIVE NEGATIVE mg/dL   Protein, ur 30 (A) NEGATIVE mg/dL   Nitrite POSITIVE (A) NEGATIVE   Leukocytes,Ua SMALL (A) NEGATIVE   RBC / HPF >50 (H) 0 - 5 RBC/hpf   WBC, UA 21-50 0 - 5 WBC/hpf   Bacteria, UA NONE SEEN NONE SEEN   Squamous Epithelial / LPF 0-5 0 - 5   Mucus PRESENT     Comment: Performed at [facility], [address] [place], [place] [address]  Basic metabolic panel     Status: Abnormal    Collection Time: 09/15/21  8:02 PM  Result Value Ref Range   Sodium 138 135 - 145 mmol/L   Potassium 4.1 3.5 - 5.1 mmol/L   Chloride 97 (L) 98 - 111 mmol/L   CO2 30 22 - 32 mmol/L   Glucose, Bld 144 (H) 70 - 99 mg/dL    Comment: Glucose reference range applies only to samples taken after fasting for at least 8 hours.   BUN 19 6 - 20 mg/dL   Creatinine, Ser 9.14 0.61 - 1.24 mg/dL   Calcium 8.9 8.9 - 78.2 mg/dL   GFR, Estimated >95 >62 mL/min    Comment: (NOTE) Calculated using the CKD-EPI Creatinine Equation (2021)    Anion gap 11 5 - 15    Comment: Performed at [facility], [address] [place], [place] [address]  CBC     Status: None   Collection Time: 09/15/21  8:02 PM  Result Value Ref Range   WBC 7.4 4.0 - 10.5 K/uL   RBC 4.81 4.22 - 5.81 MIL/uL   Hemoglobin 13.8 13.0 - 17.0 g/dL   HCT 57.8 46.9 - 62.9 %   MCV 88.1 80.0 - 100.0 fL   MCH 28.7 26.0 - 34.0 pg   MCHC 32.5 30.0 - 36.0 g/dL   RDW 52.8 41.3 - 24.4 %   Platelets 246 150 - 400 K/uL   nRBC 0.0 0.0 - 0.2 %    Comment: Performed at [facility], [address], [place], [place] [address]  Hepatic function panel     Status: None   Collection Time: 09/15/21  8:02 PM  Result Value Ref Range   Total Protein 7.1 6.5 - 8.1  g/dL   Albumin 3.6 3.5 - 5.0 g/dL   AST 18 15 - 41 U/L   ALT 15 0 - 44 U/L   Alkaline Phosphatase 66 38 - 126 U/L   Total Bilirubin 0.6 0.3 - 1.2 mg/dL   Bilirubin, Direct <1.9 0.0 - 0.2 mg/dL   Indirect Bilirubin NOT CALCULATED 0.3 - 0.9 mg/dL    Comment: Performed at [facility], [address] [place], [place] [address]  Lipase, blood     Status: None   Collection Time: 09/15/21  8:02 PM  Result Value Ref Range   Lipase 35 11 - 51 U/L    Comment: Performed at [facility], [address] [place], [place] [address]  Urine Culture     Status: None   Collection Time: 09/15/21  8:03 PM   Specimen: Urine, Random  Result Value Ref Range   Specimen  Description      URINE, RANDOM Performed at [facility], [address], [place], [place] [address]    Special Requests      NONE Performed at [facility], [address], [place], [place] [address]    Culture      NO GROWTH Performed at [facility], [address], [place], [place] [address]    Report Status 09/17/2021 FINAL   CBG monitoring, ED     Status: Abnormal   Collection Time: 09/16/21  2:11 PM  Result Value Ref Range   Glucose-Capillary 124 (H) 70 - 99 mg/dL    Comment: Glucose reference range applies only to samples taken after fasting for at least 8 hours.  CBC with Differential     Status: Abnormal   Collection Time: 09/16/21  2:13 PM  Result Value Ref Range   WBC 7.2 4.0 - 10.5 K/uL   RBC 4.37 4.22 - 5.81 MIL/uL   Hemoglobin 12.5 (L) 13.0 - 17.0 g/dL   HCT 84.1 32.4 - 40.1 %   MCV 90.8 80.0 - 100.0 fL   MCH 28.6 26.0 - 34.0 pg   MCHC 31.5 30.0 - 36.0 g/dL   RDW 02.7 25.3 - 66.4 %   Platelets 226 150 - 400 K/uL   nRBC 0.3 (H) 0.0 - 0.2 %   Neutrophils Relative % 48 %   Neutro Abs 3.5 1.7 - 7.7 K/uL   Lymphocytes Relative 28 %   Lymphs Abs 2.0 0.7 - 4.0 K/uL   Monocytes Relative 14 %   Monocytes Absolute 1.0 0.1 - 1.0 K/uL   Eosinophils Relative 5 %   Eosinophils Absolute 0.3 0.0 - 0.5 K/uL   Basophils Relative 1 %   Basophils Absolute 0.1 0.0 - 0.1 K/uL   Immature Granulocytes 4 %   Abs Immature Granulocytes 0.32 (H) 0.00 - 0.07 K/uL    Comment: Performed at [facility], [address] [place], [place] [address]  Comprehensive metabolic panel     Status: Abnormal   Collection Time: 09/16/21  2:13 PM  Result Value Ref Range   Sodium 136 135 - 145 mmol/L   Potassium 5.4 (H) 3.5 - 5.1 mmol/L    Comment: HEMOLYSIS AT THIS LEVEL MAY AFFECT RESULT   Chloride 98 98 - 111 mmol/L   CO2 30 22 - 32 mmol/L   Glucose, Bld 120 (H) 70 - 99 mg/dL    Comment: Glucose reference range applies only to samples taken after fasting  for at least 8 hours.   BUN 20 6 - 20 mg/dL  Creatinine, Ser 1.04 0.61 - 1.24 mg/dL   Calcium 8.6 (L) 8.9 - 10.3 mg/dL   Total Protein 6.4 (L) 6.5 - 8.1 g/dL   Albumin 3.3 (L) 3.5 - 5.0 g/dL   AST 22 15 - 41 U/L   ALT 12 0 - 44 U/L   Alkaline Phosphatase 55 38 - 126 U/L   Total Bilirubin 0.7 0.3 - 1.2 mg/dL   GFR, Estimated >16 >10 mL/min    Comment: (NOTE) Calculated using the CKD-EPI Creatinine Equation (2021)    Anion gap 8 5 - 15    Comment: Performed at [facility], [address] [place], [place] [address]  Protime-INR     Status: None   Collection Time: 09/16/21  2:13 PM  Result Value Ref Range   Prothrombin Time 13.1 11.4 - 15.2 seconds   INR 1.0 0.8 - 1.2    Comment: (NOTE) INR goal varies based on device and disease states. Performed at [facility], [address] [place], [place] [address]   Troponin I (High Sensitivity)     Status: None   Collection Time: 09/16/21  2:13 PM  Result Value Ref Range   Troponin I (High Sensitivity) 5 <18 ng/L    Comment: (NOTE) Elevated high sensitivity troponin I (hsTnI) values and significant  changes across serial measurements may suggest ACS but many other  chronic and acute conditions are known to elevate hsTnI results.  Refer to the "Links" section for chest pain algorithms and additional  guidance. Performed at [facility], [address] [place], [place] [address]   Valproic acid level     Status: None   Collection Time: 09/16/21  2:13 PM  Result Value Ref Range   Valproic Acid Lvl 72 50.0 - 100.0 ug/mL    Comment: Performed at [facility], [address] [place], [place] [address]  Type and screen [facility]     Status: None   Collection Time: 09/16/21  2:46 PM  Result Value Ref Range   ABO/RH(D) O POS    Antibody Screen NEG    Sample Expiration      09/19/2021,2359 Performed at [facility], [address] [place], [place] [address]    Lactic acid, plasma     Status: None   Collection Time: 09/16/21  2:46 PM  Result Value Ref Range   Lactic Acid, Venous 1.2 0.5 - 1.9 mmol/L    Comment: Performed at [facility], [address] [place], [place] [address]  Urinalysis, Routine w reflex microscopic Urine, Random     Status: Abnormal   Collection Time: 09/17/21  2:26 PM  Result Value Ref Range   Color, Urine AMBER (A) YELLOW    Comment: BIOCHEMICALS MAY BE AFFECTED BY COLOR   APPearance CLEAR (A) CLEAR   Specific Gravity, Urine 1.026 1.005 - 1.030   pH 5.0 5.0 - 8.0   Glucose, UA NEGATIVE NEGATIVE mg/dL   Hgb urine dipstick NEGATIVE NEGATIVE   Bilirubin Urine NEGATIVE NEGATIVE   Ketones, ur NEGATIVE NEGATIVE mg/dL   Protein, ur NEGATIVE NEGATIVE mg/dL   Nitrite POSITIVE (A) NEGATIVE   Leukocytes,Ua NEGATIVE NEGATIVE   RBC / HPF 11-20 0 - 5 RBC/hpf   WBC, UA 0-5 0 - 5 WBC/hpf   Bacteria, UA NONE SEEN NONE SEEN   Squamous Epithelial / LPF 0-5 0 - 5   Mucus PRESENT     Comment: Performed at [facility], [address]  [place], [place] [address]  CBC with Differential     Status: Abnormal   Collection Time: 09/17/21  2:27 PM  Result Value Ref Range   WBC 8.2 4.0 - 10.5 K/uL   RBC 4.16 (L) 4.22 - 5.81 MIL/uL   Hemoglobin 12.0 (L) 13.0 - 17.0 g/dL   HCT 88.5 (L) 02.7 - 74.1 %   MCV 91.3 80.0 - 100.0 fL   MCH 28.8 26.0 - 34.0 pg   MCHC 31.6 30.0 - 36.0 g/dL   RDW 28.7 86.7 - 67.2 %   Platelets 179 150 - 400 K/uL   nRBC 0.0 0.0 - 0.2 %   Neutrophils Relative % 61 %   Neutro Abs 5.1 1.7 - 7.7 K/uL   Lymphocytes Relative 21 %   Lymphs Abs 1.7 0.7 - 4.0 K/uL   Monocytes Relative 10 %   Monocytes Absolute 0.8 0.1 - 1.0 K/uL   Eosinophils Relative 1 %   Eosinophils Absolute 0.1 0.0 - 0.5 K/uL   Basophils Relative 1 %   Basophils Absolute 0.1 0.0 - 0.1 K/uL   RBC Morphology MORPHOLOGY UNREMARKABLE    Smear Review MORPHOLOGY UNREMARKABLE    Immature Granulocytes 6 %   Abs Immature  Granulocytes 0.46 (H) 0.00 - 0.07 K/uL    Comment: Performed at [facility], [address] [place], [place] [address]  Valproic acid level     Status: None   Collection Time: 09/17/21  2:27 PM  Result Value Ref Range   Valproic Acid Lvl 94 50.0 - 100.0 ug/mL    Comment: Performed at [facility], [address] [place], [place] [address]  Comprehensive metabolic panel     Status: Abnormal   Collection Time: 09/17/21  2:27 PM  Result Value Ref Range   Sodium 138 135 - 145 mmol/L   Potassium 5.0 3.5 - 5.1 mmol/L   Chloride 96 (L) 98 - 111 mmol/L   CO2 33 (H) 22 - 32 mmol/L   Glucose, Bld 119 (H) 70 - 99 mg/dL    Comment: Glucose reference range applies only to samples taken after fasting for at least 8 hours.   BUN 18 6 - 20 mg/dL   Creatinine, Ser 6.62 0.61 - 1.24 mg/dL   Calcium 8.5 (L) 8.9 - 10.3 mg/dL   Total Protein 6.5 6.5 - 8.1 g/dL   Albumin 3.3 (L) 3.5 - 5.0 g/dL   AST 17 15 - 41 U/L   ALT 14 0 - 44 U/L   Alkaline Phosphatase 51 38 - 126 U/L   Total Bilirubin 0.7 0.3 - 1.2 mg/dL   GFR, Estimated >94 >76 mL/min    Comment: (NOTE) Calculated using the CKD-EPI Creatinine Equation (2021)    Anion gap 9 5 - 15    Comment: Performed at [facility], [address] [place], [place] [address]  CK     Status: None   Collection Time: 09/17/21  2:27 PM  Result Value Ref Range   Total CK 88 49 - 397 U/L    Comment: Performed at [facility], [address] [place], [place] [address]  Brain natriuretic peptide     Status: Abnormal   Collection Time: 09/17/21  2:27 PM  Result Value Ref Range   B Natriuretic Peptide 778.8 (H) 0.0 - 100.0 pg/mL    Comment: Performed at [facility], [address], [place], [place] [address]  Procalcitonin - Baseline     Status: None   Collection Time:  09/17/21  2:43 PM  Result Value Ref Range   Procalcitonin <0.10 ng/mL    Comment:        Interpretation: PCT (Procalcitonin) <= 0.5  ng/mL: Systemic infection (sepsis) is not likely. Local bacterial infection is possible. (NOTE)       Sepsis PCT Algorithm           Lower Respiratory Tract                                      Infection PCT Algorithm    ----------------------------     ----------------------------         PCT < 0.25 ng/mL                PCT < 0.10 ng/mL          Strongly encourage             Strongly discourage   discontinuation of antibiotics    initiation of antibiotics    ----------------------------     -----------------------------       PCT 0.25 - 0.50 ng/mL            PCT 0.10 - 0.25 ng/mL               OR       >80% decrease in PCT            Discourage initiation of                                            antibiotics      Encourage discontinuation           of antibiotics    ----------------------------     -----------------------------         PCT >= 0.50 ng/mL              PCT 0.26 - 0.50 ng/mL               AND        <80% decrease in PCT             Encourage initiation of                                             antibiotics       Encourage continuation           of antibiotics    ----------------------------     -----------------------------        PCT >= 0.50 ng/mL                  PCT > 0.50 ng/mL               AND         increase in PCT                  Strongly encourage                                      initiation of antibiotics    Strongly encourage escalation  of antibiotics                                     -----------------------------                                           PCT <= 0.25 ng/mL                                                 OR                                        > 80% decrease in PCT                                      Discontinue / Do not initiate                                             antibiotics  Performed at [facility], [address], [place], [place] [address]   Urine Culture     Status: None    Collection Time: 09/17/21  3:08 PM   Specimen: Urine, Random  Result Value Ref Range   Specimen Description      URINE, RANDOM Performed at [facility], [address] [place], [place] [address]    Special Requests      NONE Performed at [facility], [address], [place], [place] [address]    Culture      NO GROWTH Performed at [facility], [address] [place], [place] [address]    Report Status 09/19/2021 FINAL   Troponin I (High Sensitivity)     Status: None   Collection Time: 09/17/21  3:30 PM  Result Value Ref Range   Troponin I (High Sensitivity) 6 <18 ng/L    Comment: (NOTE) Elevated high sensitivity troponin I (hsTnI) values and significant  changes across serial measurements may suggest ACS but many other  chronic and acute conditions are known to elevate hsTnI results.  Refer to the "Links" section for chest pain algorithms and additional  guidance. Performed at [facility], [address] [place], [place] [address]   Troponin I (High Sensitivity)     Status: None   Collection Time: 09/17/21  7:33 PM  Result Value Ref Range   Troponin I (High Sensitivity) 5 <18 ng/L    Comment: (NOTE) Elevated high sensitivity troponin I (hsTnI) values and significant  changes across serial measurements may suggest ACS but many other  chronic and acute conditions are known to elevate hsTnI results.  Refer to the "Links" section for chest pain algorithms and additional  guidance. Performed at [facility], [address] [place], [place] [address]   Comprehensive metabolic panel     Status: Abnormal   Collection Time: 09/19/21  9:43 AM  Result Value Ref Range  Sodium 135 135 - 145 mmol/L   Potassium 4.5 3.5 - 5.1 mmol/L   Chloride 86 (L) 98 - 111 mmol/L   CO2 41 (H) 22 - 32 mmol/L   Glucose, Bld 121 (H) 70 - 99 mg/dL    Comment: Glucose reference range applies only to samples taken after fasting for at least 8  hours.   BUN 24 (H) 6 - 20 mg/dL   Creatinine, Ser 7.82 0.61 - 1.24 mg/dL   Calcium 8.6 (L) 8.9 - 10.3 mg/dL   Total Protein 7.2 6.5 - 8.1 g/dL   Albumin 3.5 3.5 - 5.0 g/dL   AST 14 (L) 15 - 41 U/L   ALT 13 0 - 44 U/L   Alkaline Phosphatase 52 38 - 126 U/L   Total Bilirubin 0.7 0.3 - 1.2 mg/dL   GFR, Estimated >95 >62 mL/min    Comment: (NOTE) Calculated using the CKD-EPI Creatinine Equation (2021)    Anion gap 8 5 - 15    Comment: Performed at [facility], [address] [place], [place] [address]  CBC with Differential     Status: Abnormal   Collection Time: 09/19/21  9:43 AM  Result Value Ref Range   WBC 7.5 4.0 - 10.5 K/uL   RBC 4.52 4.22 - 5.81 MIL/uL   Hemoglobin 12.8 (L) 13.0 - 17.0 g/dL   HCT 57.8 46.9 - 62.9 %   MCV 89.6 80.0 - 100.0 fL   MCH 28.3 26.0 - 34.0 pg   MCHC 31.6 30.0 - 36.0 g/dL   RDW 52.8 41.3 - 24.4 %   Platelets 204 150 - 400 K/uL   nRBC 2.1 (H) 0.0 - 0.2 %   Neutrophils Relative % 72 %   Neutro Abs 5.4 1.7 - 7.7 K/uL   Lymphocytes Relative 14 %   Lymphs Abs 1.1 0.7 - 4.0 K/uL   Monocytes Relative 9 %   Monocytes Absolute 0.7 0.1 - 1.0 K/uL   Eosinophils Relative 0 %   Eosinophils Absolute 0.0 0.0 - 0.5 K/uL   Basophils Relative 1 %   Basophils Absolute 0.1 0.0 - 0.1 K/uL   Immature Granulocytes 4 %   Abs Immature Granulocytes 0.33 (H) 0.00 - 0.07 K/uL    Comment: Performed at [facility], [address] [place], [place] [address]  Troponin I (High Sensitivity)     Status: None   Collection Time: 09/19/21  9:43 AM  Result Value Ref Range   Troponin I (High Sensitivity) 4 <18 ng/L    Comment: (NOTE) Elevated high sensitivity troponin I (hsTnI) values and significant  changes across serial measurements may suggest ACS but many other  chronic and acute conditions are known to elevate hsTnI results.  Refer to the "Links" section for chest pain algorithms and additional  guidance. Performed at [facility], [address] [place], [place] [address]   Brain natriuretic peptide     Status: Abnormal   Collection Time: 09/19/21  9:43 AM  Result Value Ref Range   B Natriuretic Peptide 629.9 (H) 0.0 - 100.0 pg/mL    Comment: Performed at [facility], [address] [place], [place] [address]  Resp Panel by RT-PCR (Flu A&B, Covid) Nasopharyngeal Swab     Status: None   Collection Time: 09/20/21  7:39 PM   Specimen: Nasopharyngeal Swab; Nasopharyngeal(NP) swabs in vial transport medium  Result Value Ref Range   SARS Coronavirus 2 by RT PCR NEGATIVE NEGATIVE    Comment: (  NOTE) SARS-CoV-2 target nucleic acids are NOT DETECTED.  The SARS-CoV-2 RNA is generally detectable in upper respiratory specimens during the acute phase of infection. The lowest concentration of SARS-CoV-2 viral copies this assay can detect is 138 copies/mL. A negative result does not preclude SARS-Cov-2 infection and should not be used as the sole basis for treatment or other patient management decisions. A negative result may occur with  improper specimen collection/handling, submission of specimen other than nasopharyngeal swab, presence of viral mutation(s) within the areas targeted by this assay, and inadequate number of viral copies(<138 copies/mL). A negative result must be combined with clinical observations, patient history, and epidemiological information. The expected result is Negative.  Fact Sheet for Patients:  BloggerCourse.com  Fact Sheet for Healthcare Providers:  SeriousBroker.it  This test is no t yet approved or cleared by the [place] FDA and  has been authorized for detection and/or diagnosis of SARS-CoV-2 by FDA under an Emergency Use Authorization (EUA). This EUA will remain  in effect (meaning this test can be used) for the duration of the COVID-19 declaration under Section 564(b)(1) of the Act, 21 U.S.C.section 360bbb-3(b)(1), unless  the authorization is terminated  or revoked sooner.       Influenza A by PCR NEGATIVE NEGATIVE   Influenza B by PCR NEGATIVE NEGATIVE    Comment: (NOTE) The Xpert Xpress SARS-CoV-2/FLU/RSV plus assay is intended as an aid in the diagnosis of influenza from Nasopharyngeal swab specimens and should not be used as a sole basis for treatment. Nasal washings and aspirates are unacceptable for Xpert Xpress SARS-CoV-2/FLU/RSV testing.  Fact Sheet for Patients: BloggerCourse.com  Fact Sheet for Healthcare Providers: SeriousBroker.it  This test is not yet approved or cleared by the [place] FDA and has been authorized for detection and/or diagnosis of SARS-CoV-2 by FDA under an Emergency Use Authorization (EUA). This EUA will remain in effect (meaning this test can be used) for the duration of the COVID-19 declaration under Section 564(b)(1) of the Act, 21 U.S.C. section 360bbb-3(b)(1), unless the authorization is terminated or revoked.  Performed at [facility], [address] [place], [place] [address]   CBC     Status: Abnormal   Collection Time: 09/20/21  8:11 PM  Result Value Ref Range   WBC 6.3 4.0 - 10.5 K/uL   RBC 4.46 4.22 - 5.81 MIL/uL   Hemoglobin 12.7 (L) 13.0 - 17.0 g/dL   HCT 29.5 62.1 - 30.8 %   MCV 91.3 80.0 - 100.0 fL   MCH 28.5 26.0 - 34.0 pg   MCHC 31.2 30.0 - 36.0 g/dL   RDW 65.7 84.6 - 96.2 %   Platelets 194 150 - 400 K/uL   nRBC 2.6 (H) 0.0 - 0.2 %    Comment: Performed at [facility], [address], [place], [place] [address]  Comprehensive metabolic panel     Status: Abnormal   Collection Time: 09/20/21  8:11 PM  Result Value Ref Range   Sodium 139 135 - 145 mmol/L   Potassium 3.9 3.5 - 5.1 mmol/L   Chloride 89 (L) 98 - 111 mmol/L   CO2 42 (H) 22 - 32 mmol/L   Glucose, Bld 98 70 - 99 mg/dL    Comment: Glucose reference range applies only to samples taken after fasting for  at least 8 hours.   BUN 22 (H) 6 - 20 mg/dL   Creatinine, Ser 1.32 0.61 - 1.24 mg/dL   Calcium 8.8 (L) 8.9 - 10.3 mg/dL  Total Protein 6.7 6.5 - 8.1 g/dL   Albumin 3.5 3.5 - 5.0 g/dL   AST 14 (L) 15 - 41 U/L   ALT 12 0 - 44 U/L   Alkaline Phosphatase 43 38 - 126 U/L   Total Bilirubin 0.9 0.3 - 1.2 mg/dL   GFR, Estimated >06 >23 mL/min    Comment: (NOTE) Calculated using the CKD-EPI Creatinine Equation (2021)    Anion gap 8 5 - 15    Comment: Performed at [facility], [address] [place], [place] [address]  Brain natriuretic peptide     Status: Abnormal   Collection Time: 09/20/21  8:11 PM  Result Value Ref Range   B Natriuretic Peptide 636.1 (H) 0.0 - 100.0 pg/mL    Comment: Performed at [facility], [address] [place], [place] [address]  Troponin I (High Sensitivity)     Status: None   Collection Time: 09/20/21  8:11 PM  Result Value Ref Range   Troponin I (High Sensitivity) 12 <18 ng/L    Comment: (NOTE) Elevated high sensitivity troponin I (hsTnI) values and significant  changes across serial measurements may suggest ACS but many other  chronic and acute conditions are known to elevate hsTnI results.  Refer to the "Links" section for chest pain algorithms and additional  guidance. Performed at [facility], [address] [place], [place] [address]   Troponin I (High Sensitivity)     Status: None   Collection Time: 09/24/21 11:37 AM  Result Value Ref Range   Troponin I (High Sensitivity) 4 <18 ng/L    Comment: (NOTE) Elevated high sensitivity troponin I (hsTnI) values and significant  changes across serial measurements may suggest ACS but many other  chronic and acute conditions are known to elevate hsTnI results.  Refer to the "Links" section for chest pain algorithms and additional  guidance. Performed at [facility], [address] [place], [place] [address]   CBC     Status: None   Collection Time: 09/24/21  12:15 PM  Result Value Ref Range   WBC 6.3 4.0 - 10.5 K/uL   RBC 4.71 4.22 - 5.81 MIL/uL   Hemoglobin 13.5 13.0 - 17.0 g/dL   HCT 69.4 85.4 - 62.7 %   MCV 89.8 80.0 - 100.0 fL   MCH 28.7 26.0 - 34.0 pg   MCHC 31.9 30.0 - 36.0 g/dL   RDW 03.5 00.9 - 38.1 %   Platelets 181 150 - 400 K/uL   nRBC 0.0 0.0 - 0.2 %    Comment: Performed at [facility], [address], [place], [place] [address]  Magnesium     Status: None   Collection Time: 09/24/21 12:44 PM  Result Value Ref Range   Magnesium 2.0 1.7 - 2.4 mg/dL    Comment: Performed at [facility], [address] [place], [place] [address]  TSH     Status: None   Collection Time: 09/24/21 12:44 PM  Result Value Ref Range   TSH 3.237 0.350 - 4.500 uIU/mL    Comment: Performed by a 3rd Generation assay with a functional sensitivity of <=0.01 uIU/mL. Performed at [facility], [address] [place], [place] [address]   Valproic acid level     Status: Abnormal   Collection Time: 09/24/21 12:44 PM  Result Value Ref Range   Valproic Acid Lvl 119 (H) 50.0 - 100.0 ug/mL    Comment: Performed at [facility], [address]  [place], [place] [address]  Basic metabolic panel     Status: Abnormal   Collection Time: 09/24/21 12:44 PM  Result Value Ref Range   Sodium 139 135 - 145 mmol/L   Potassium 4.1 3.5 - 5.1 mmol/L   Chloride 95 (L) 98 - 111 mmol/L   CO2 39 (H) 22 - 32 mmol/L   Glucose, Bld 108 (H) 70 - 99 mg/dL    Comment: Glucose reference range applies only to samples taken after fasting for at least 8 hours.   BUN 14 6 - 20 mg/dL   Creatinine, Ser 1.91 0.61 - 1.24 mg/dL   Calcium 8.8 (L) 8.9 - 10.3 mg/dL   GFR, Estimated >47 >82 mL/min    Comment: (NOTE) Calculated using the CKD-EPI Creatinine Equation (2021)    Anion gap 5 5 - 15    Comment: Performed at [facility], [address] [place], [place] [address]  Troponin I (High Sensitivity)     Status: None   Collection  Time: 09/24/21  2:12 PM  Result Value Ref Range   Troponin I (High Sensitivity) 3 <18 ng/L    Comment: (NOTE) Elevated high sensitivity troponin I (hsTnI) values and significant  changes across serial measurements may suggest ACS but many other  chronic and acute conditions are known to elevate hsTnI results.  Refer to the Links section for chest pain algorithms and additional  guidance. Performed at [facility], [address] [place], [place] [address]   Blood gas, venous     Status: Abnormal   Collection Time: 09/24/21  2:12 PM  Result Value Ref Range   pH, Ven 7.43 7.25 - 7.43   pCO2, Ven 74 (HH) 44 - 60 mmHg    Comment: RBV JOHN VANDRUFF RN AT [address] BY [address] ELINSKI RRT ON [id number]    pO2, Ven 33 32 - 45 mmHg   Bicarbonate 49.1 (H) 20.0 - 28.0 mmol/L   Acid-Base Excess 20.3 (H) 0.0 - 2.0 mmol/L   O2 Saturation 59.3 %   Patient temperature 37.0    Collection site VENOUS     Comment: Performed at [facility], [address] [place], [place] [address]  Hepatic function panel     Status: Abnormal   Collection Time: 09/24/21  2:12 PM  Result Value Ref Range   Total Protein 6.5 6.5 - 8.1 g/dL   Albumin 3.5 3.5 - 5.0 g/dL   AST 13 (L) 15 - 41 U/L   ALT 12 0 - 44 U/L   Alkaline Phosphatase 45 38 - 126 U/L   Total Bilirubin 0.6 0.3 - 1.2 mg/dL   Bilirubin, Direct <3.2 0.0 - 0.2 mg/dL   Indirect Bilirubin NOT CALCULATED 0.3 - 0.9 mg/dL    Comment: Performed at [facility], [address] [place], [place] [address]  Ammonia     Status: Abnormal   Collection Time: 09/24/21  2:12 PM  Result Value Ref Range   Ammonia 94 (H) 9 - 35 umol/L    Comment: Performed at [facility], [address] [place], [place] [address]  CBG monitoring, ED     Status: None   Collection Time: 09/24/21  8:52 PM  Result Value Ref Range   Glucose-Capillary 94 70 - 99 mg/dL    Comment: Glucose reference range applies only to samples taken after fasting for at  least 8 hours.  Valproic acid level     Status: None   Collection Time: 09/24/21 11:15 PM  Result Value Ref Range   Valproic Acid Lvl 99 50.0 - 100.0 ug/mL    Comment: Performed at [facility], [address] [place], [place] [address]  Ammonia     Status: Abnormal   Collection Time: 09/24/21 11:15 PM  Result Value Ref Range   Ammonia 61 (H) 9 - 35 umol/L    Comment: Performed at [facility], [address] [place], [place] [address]  Basic metabolic panel     Status: Abnormal   Collection Time: 09/25/21  5:00 AM  Result Value Ref Range   Sodium 137 135 - 145 mmol/L   Potassium 3.9 3.5 - 5.1 mmol/L   Chloride 89 (L) 98 - 111 mmol/L   CO2 41 (H) 22 - 32 mmol/L   Glucose, Bld 127 (H) 70 - 99 mg/dL    Comment: Glucose reference range applies only to samples taken after fasting for at least 8 hours.   BUN 18 6 - 20 mg/dL   Creatinine, Ser 0.98 0.61 - 1.24 mg/dL   Calcium 8.9 8.9 - 11.9 mg/dL   GFR, Estimated >14 >78 mL/min    Comment: (NOTE) Calculated using the CKD-EPI Creatinine Equation (2021)    Anion gap 7 5 - 15    Comment: Performed at [facility], [address] [place], [place] [address]  CBC     Status: None   Collection Time: 09/25/21  5:00 AM  Result Value Ref Range   WBC 5.3 4.0 - 10.5 K/uL   RBC 4.53 4.22 - 5.81 MIL/uL   Hemoglobin 13.1 13.0 - 17.0 g/dL   HCT 13.0 86.5 - 78.4 %   MCV 91.2 80.0 - 100.0 fL   MCH 28.9 26.0 - 34.0 pg   MCHC 31.7 30.0 - 36.0 g/dL   RDW 69.6 29.5 - 28.4 %   Platelets 154 150 - 400 K/uL   nRBC 0.0 0.0 - 0.2 %    Comment: Performed at [facility], [address] [place], [place] [address]  Valproic acid level     Status: None   Collection Time: 09/25/21  5:00 AM  Result Value Ref Range   Valproic Acid Lvl 73 50.0 - 100.0 ug/mL    Comment: Performed at [facility], [address] [place], [place] [address]  CBG monitoring, ED     Status: Abnormal   Collection Time: 09/25/21   7:38 AM  Result Value Ref Range   Glucose-Capillary 130 (H) 70 - 99 mg/dL    Comment: Glucose reference range applies only to samples taken after fasting for at least 8 hours.  Ammonia     Status: Abnormal   Collection Time: 09/25/21  8:20 AM  Result Value Ref Range   Ammonia 47 (H) 9 - 35 umol/L    Comment: Performed at [facility], [address] [place], [place] [address]  CBG monitoring, ED     Status: Abnormal   Collection Time: 09/25/21 11:57 AM  Result Value Ref Range   Glucose-Capillary 126 (H) 70 - 99 mg/dL    Comment: Glucose reference range applies only to samples taken after fasting for at least 8 hours.  Valproic acid level     Status: None   Collection Time: 09/25/21  1:01 PM  Result Value Ref Range   Valproic Acid Lvl 66 50.0 - 100.0 ug/mL    Comment: Performed at [facility], [address] [place], [place] [address]  Ammonia     Status:  Abnormal   Collection Time: 09/25/21  1:01 PM  Result Value Ref Range   Ammonia 42 (H) 9 - 35 umol/L    Comment: Performed at [facility], [address] [place], [place] [address]  Comprehensive metabolic panel     Status: Abnormal   Collection Time: 09/29/21  6:24 PM  Result Value Ref Range   Sodium 138 135 - 145 mmol/L   Potassium 3.9 3.5 - 5.1 mmol/L   Chloride 96 (L) 98 - 111 mmol/L   CO2 32 22 - 32 mmol/L   Glucose, Bld 104 (H) 70 - 99 mg/dL    Comment: Glucose reference range applies only to samples taken after fasting for at least 8 hours.   BUN 18 6 - 20 mg/dL   Creatinine, Ser 6.57 0.61 - 1.24 mg/dL   Calcium 9.2 8.9 - 84.6 mg/dL   Total Protein 7.4 6.5 - 8.1 g/dL   Albumin 3.9 3.5 - 5.0 g/dL   AST 18 15 - 41 U/L   ALT 18 0 - 44 U/L   Alkaline Phosphatase 58 38 - 126 U/L   Total Bilirubin 0.7 0.3 - 1.2 mg/dL   GFR, Estimated >96 >29 mL/min    Comment: (NOTE) Calculated using the CKD-EPI Creatinine Equation (2021)    Anion gap 10 5 - 15    Comment: Performed at [facility], [address] [place], [place] [address]  CBC with Differential     Status: None   Collection Time: 09/29/21  6:24 PM  Result Value Ref Range   WBC 6.4 4.0 - 10.5 K/uL   RBC 4.69 4.22 - 5.81 MIL/uL   Hemoglobin 13.4 13.0 - 17.0 g/dL   HCT 32.4 40.1 - 02.7 %   MCV 88.9 80.0 - 100.0 fL   MCH 28.6 26.0 - 34.0 pg   MCHC 32.1 30.0 - 36.0 g/dL   RDW 25.3 66.4 - 40.3 %   Platelets 150 150 - 400 K/uL   nRBC 0.0 0.0 - 0.2 %   Neutrophils Relative % 59 %   Neutro Abs 3.8 1.7 - 7.7 K/uL   Lymphocytes Relative 23 %   Lymphs Abs 1.5 0.7 - 4.0 K/uL   Monocytes Relative 14 %   Monocytes Absolute 0.9 0.1 - 1.0 K/uL   Eosinophils Relative 2 %   Eosinophils Absolute 0.1 0.0 - 0.5 K/uL   Basophils Relative 1 %   Basophils Absolute 0.0 0.0 - 0.1 K/uL   Immature Granulocytes 1 %   Abs Immature Granulocytes 0.05 0.00 - 0.07 K/uL    Comment: Performed at [facility], [address] [place], [place] [address]  Ammonia     Status: Abnormal   Collection Time: 09/29/21  6:24 PM  Result Value Ref Range   Ammonia 42 (H) 9 - 35 umol/L    Comment: Performed at [facility], [address] [place], [place] [address]  Troponin I (High Sensitivity)     Status: None   Collection Time: 09/29/21  6:24 PM  Result Value Ref Range   Troponin I (High Sensitivity) 3 <18 ng/L    Comment: (NOTE) Elevated high sensitivity troponin I (hsTnI) values and significant  changes across serial measurements may suggest ACS but many other  chronic and acute conditions are known to elevate hsTnI results.  Refer to the "Links" section for chest pain algorithms and additional  guidance. Performed at [facility], [address], [place], [place] [address]   Troponin  I (High Sensitivity)     Status: None   Collection Time: 09/29/21  9:08 PM  Result Value Ref Range   Troponin I (High Sensitivity) 3 <18 ng/L    Comment: (NOTE) Elevated high sensitivity troponin I (hsTnI) values  and significant  changes across serial measurements may suggest ACS but many other  chronic and acute conditions are known to elevate hsTnI results.  Refer to the "Links" section for chest pain algorithms and additional  guidance. Performed at [facility], [address] [place], [place] [address]   CBG monitoring, ED     Status: Abnormal   Collection Time: 10/03/21  2:33 PM  Result Value Ref Range   Glucose-Capillary 133 (H) 70 - 99 mg/dL    Comment: Glucose reference range applies only to samples taken after fasting for at least 8 hours.  CBC with Differential     Status: Abnormal   Collection Time: 10/03/21  2:56 PM  Result Value Ref Range   WBC 5.3 4.0 - 10.5 K/uL   RBC 4.79 4.22 - 5.81 MIL/uL   Hemoglobin 13.7 13.0 - 17.0 g/dL   HCT 60.4 54.0 - 98.1 %   MCV 88.7 80.0 - 100.0 fL   MCH 28.6 26.0 - 34.0 pg   MCHC 32.2 30.0 - 36.0 g/dL   RDW 19.1 47.8 - 29.5 %   Platelets 204 150 - 400 K/uL   nRBC 0.4 (H) 0.0 - 0.2 %   Neutrophils Relative % 50 %   Neutro Abs 2.7 1.7 - 7.7 K/uL   Lymphocytes Relative 32 %   Lymphs Abs 1.7 0.7 - 4.0 K/uL   Monocytes Relative 14 %   Monocytes Absolute 0.7 0.1 - 1.0 K/uL   Eosinophils Relative 2 %   Eosinophils Absolute 0.1 0.0 - 0.5 K/uL   Basophils Relative 1 %   Basophils Absolute 0.0 0.0 - 0.1 K/uL   Immature Granulocytes 1 %   Abs Immature Granulocytes 0.06 0.00 - 0.07 K/uL    Comment: Performed at [facility], [address] [place], [place] [address]  Basic metabolic panel     Status: Abnormal   Collection Time: 10/03/21  2:56 PM  Result Value Ref Range   Sodium 136 135 - 145 mmol/L   Potassium 4.7 3.5 - 5.1 mmol/L   Chloride 96 (L) 98 - 111 mmol/L   CO2 31 22 - 32 mmol/L   Glucose, Bld 109 (H) 70 - 99 mg/dL    Comment: Glucose reference range applies only to samples taken after fasting for at least 8 hours.   BUN 17 6 - 20 mg/dL   Creatinine, Ser 8.65 0.61 - 1.24 mg/dL   Calcium 9.0 8.9 - 78.4 mg/dL    GFR, Estimated >69 >62 mL/min    Comment: (NOTE) Calculated using the CKD-EPI Creatinine Equation (2021)    Anion gap 9 5 - 15    Comment: Performed at [facility], [address] [place], [place] [address]  Ammonia     Status: Abnormal   Collection Time: 10/03/21  2:56 PM  Result Value Ref Range   Ammonia 61 (H) 9 - 35 umol/L    Comment: Performed at [facility], [address] [place], [place] [address]  Valproic acid level     Status: Abnormal   Collection Time: 10/03/21  2:56 PM  Result Value Ref Range   Valproic Acid Lvl 125 (H) 50.0 - 100.0 ug/mL    Comment: Performed at [facility], [address], [place], [place] [address]  Hepatic function panel     Status: None   Collection Time: 10/03/21  2:56 PM  Result Value Ref Range   Total Protein 6.9 6.5 - 8.1 g/dL   Albumin 3.6 3.5 - 5.0 g/dL   AST 16 15 - 41 U/L   ALT 15 0 - 44 U/L   Alkaline Phosphatase 52 38 - 126 U/L   Total Bilirubin 0.6 0.3 - 1.2 mg/dL   Bilirubin, Direct <6.0 0.0 - 0.2 mg/dL   Indirect Bilirubin NOT CALCULATED 0.3 - 0.9 mg/dL    Comment: Performed at [facility], [address] [place], [place] [address]  Valproic acid level     Status: Abnormal   Collection Time: 10/03/21  8:09 PM  Result Value Ref Range   Valproic Acid Lvl 104 (H) 50.0 - 100.0 ug/mL    Comment: Performed at [facility], [address] [place], [place] [address]  Resp Panel by RT-PCR (Flu A&B, Covid) Nasopharyngeal Swab     Status: None   Collection Time: 10/05/21  9:44 AM   Specimen: Nasopharyngeal Swab; Nasopharyngeal(NP) swabs in vial transport medium  Result Value Ref Range   SARS Coronavirus 2 by RT PCR NEGATIVE NEGATIVE    Comment: (NOTE) SARS-CoV-2 target nucleic acids are NOT DETECTED.  The SARS-CoV-2 RNA is generally detectable in upper respiratory specimens during the acute phase of infection. The lowest concentration of SARS-CoV-2 viral copies this assay can  detect is 138 copies/mL. A negative result does not preclude SARS-Cov-2 infection and should not be used as the sole basis for treatment or other patient management decisions. A negative result may occur with  improper specimen collection/handling, submission of specimen other than nasopharyngeal swab, presence of viral mutation(s) within the areas targeted by this assay, and inadequate number of viral copies(<138 copies/mL). A negative result must be combined with clinical observations, patient history, and epidemiological information. The expected result is Negative.  Fact Sheet for Patients:  BloggerCourse.com  Fact Sheet for Healthcare Providers:  SeriousBroker.it  This test is no t yet approved or cleared by the [place] FDA and  has been authorized for detection and/or diagnosis of SARS-CoV-2 by FDA under an Emergency Use Authorization (EUA). This EUA will remain  in effect (meaning this test can be used) for the duration of the COVID-19 declaration under Section 564(b)(1) of the Act, 21 U.S.C.section 360bbb-3(b)(1), unless the authorization is terminated  or revoked sooner.       Influenza A by PCR NEGATIVE NEGATIVE   Influenza B by PCR NEGATIVE NEGATIVE    Comment: (NOTE) The Xpert Xpress SARS-CoV-2/FLU/RSV plus assay is intended as an aid in the diagnosis of influenza from Nasopharyngeal swab specimens and should not be used as a sole basis for treatment. Nasal washings and aspirates are unacceptable for Xpert Xpress SARS-CoV-2/FLU/RSV testing.  Fact Sheet for Patients: BloggerCourse.com  Fact Sheet for Healthcare Providers: SeriousBroker.it  This test is not yet approved or cleared by the [place] FDA and has been authorized for detection and/or diagnosis of SARS-CoV-2 by FDA under an Emergency Use Authorization (EUA). This EUA will remain in effect  (meaning this test can be used) for the duration of the COVID-19 declaration under Section 564(b)(1) of the Act, 21 U.S.C. section 360bbb-3(b)(1), unless the authorization is terminated or revoked.  Performed at [facility], [address] [place], [place] [address]   Ethanol     Status: None  Collection Time: 10/05/21  9:44 AM  Result Value Ref Range   Alcohol, Ethyl (B) <10 <10 mg/dL    Comment: (NOTE) Lowest detectable limit for serum alcohol is 10 mg/dL.  For medical purposes only. Performed at [facility], [address] [place], [place] [address]   Protime-INR     Status: None   Collection Time: 10/05/21  9:44 AM  Result Value Ref Range   Prothrombin Time 13.0 11.4 - 15.2 seconds   INR 1.0 0.8 - 1.2    Comment: (NOTE) INR goal varies based on device and disease states. Performed at [facility], [address] [place], [place] [address]   APTT     Status: None   Collection Time: 10/05/21  9:44 AM  Result Value Ref Range   aPTT 30 24 - 36 seconds    Comment: Performed at [facility], [address] [place], [place] [address]  CBC     Status: None   Collection Time: 10/05/21  9:44 AM  Result Value Ref Range   WBC 4.9 4.0 - 10.5 K/uL   RBC 4.96 4.22 - 5.81 MIL/uL   Hemoglobin 14.0 13.0 - 17.0 g/dL   HCT 13.2 44.0 - 10.2 %   MCV 88.5 80.0 - 100.0 fL   MCH 28.2 26.0 - 34.0 pg   MCHC 31.9 30.0 - 36.0 g/dL   RDW 72.5 36.6 - 44.0 %   Platelets 209 150 - 400 K/uL   nRBC 0.0 0.0 - 0.2 %    Comment: Performed at [facility], [address] [place], [place] [address]  Differential     Status: None   Collection Time: 10/05/21  9:44 AM  Result Value Ref Range   Neutrophils Relative % 56 %   Neutro Abs 2.8 1.7 - 7.7 K/uL   Lymphocytes Relative 26 %   Lymphs Abs 1.3 0.7 - 4.0 K/uL   Monocytes Relative 14 %   Monocytes Absolute 0.7 0.1 - 1.0 K/uL   Eosinophils Relative 2 %   Eosinophils Absolute 0.1 0.0 - 0.5  K/uL   Basophils Relative 1 %   Basophils Absolute 0.0 0.0 - 0.1 K/uL   Immature Granulocytes 1 %   Abs Immature Granulocytes 0.05 0.00 - 0.07 K/uL    Comment: Performed at [facility], [address] [place], [place] [address]  Comprehensive metabolic panel     Status: Abnormal   Collection Time: 10/05/21  9:44 AM  Result Value Ref Range   Sodium 137 135 - 145 mmol/L   Potassium 4.5 3.5 - 5.1 mmol/L   Chloride 96 (L) 98 - 111 mmol/L   CO2 33 (H) 22 - 32 mmol/L   Glucose, Bld 97 70 - 99 mg/dL    Comment: Glucose reference range applies only to samples taken after fasting for at least 8 hours.   BUN 18 6 - 20 mg/dL   Creatinine, Ser 8.75 0.61 - 1.24 mg/dL   Calcium 9.6 8.9 - 64.3 mg/dL   Total Protein 7.3 6.5 - 8.1 g/dL   Albumin 3.8 3.5 - 5.0 g/dL   AST 17 15 - 41 U/L   ALT 14 0 - 44 U/L   Alkaline Phosphatase 52 38 - 126 U/L   Total Bilirubin 0.8 0.3 - 1.2 mg/dL   GFR, Estimated >32 >95 mL/min    Comment: (NOTE) Calculated using the CKD-EPI Creatinine Equation (2021)    Anion gap 8 5 - 15    Comment:  Performed at [facility], [address] [place], [place] [address]  Urine Drug Screen, Qualitative     Status: Abnormal   Collection Time: 10/05/21  9:44 AM  Result Value Ref Range   Tricyclic, Ur Screen POSITIVE (A) NONE DETECTED   Amphetamines, Ur Screen NONE DETECTED NONE DETECTED   MDMA (Ecstasy)Ur Screen NONE DETECTED NONE DETECTED   Cocaine Metabolite,Ur Ball NONE DETECTED NONE DETECTED   Opiate, Ur Screen NONE DETECTED NONE DETECTED   Phencyclidine (PCP) Ur S NONE DETECTED NONE DETECTED   Cannabinoid 50 Ng, Ur [place]aks NONE DETECTED NONE DETECTED   Barbiturates, Ur Screen NONE DETECTED NONE DETECTED   Benzodiazepine, Ur Scrn NONE DETECTED NONE DETECTED   Methadone Scn, Ur NONE DETECTED NONE DETECTED    Comment: (NOTE) Tricyclics + metabolites, urine    Cutoff 1000 ng/mL Amphetamines + metabolites, urine  Cutoff 1000 ng/mL MDMA (Ecstasy), urine               Cutoff 500 ng/mL Cocaine Metabolite, urine          Cutoff 300 ng/mL Opiate + metabolites, urine        Cutoff 300 ng/mL Phencyclidine (PCP), urine         Cutoff 25 ng/mL Cannabinoid, urine                 Cutoff 50 ng/mL Barbiturates + metabolites, urine  Cutoff 200 ng/mL Benzodiazepine, urine              Cutoff 200 ng/mL Methadone, urine                   Cutoff 300 ng/mL  The urine drug screen provides only a preliminary, unconfirmed analytical test result and should not be used for non-medical purposes. Clinical consideration and professional judgment should be applied to any positive drug screen result due to possible interfering substances. A more specific alternate chemical method must be used in order to obtain a confirmed analytical result. Gas chromatography / mass spectrometry (GC/MS) is the preferred confirm atory method. Performed at [facility], [address] [place], [place] [address]   Urinalysis, Routine w reflex microscopic     Status: Abnormal   Collection Time: 10/05/21  9:44 AM  Result Value Ref Range   Color, Urine YELLOW (A) YELLOW   APPearance CLEAR (A) CLEAR   Specific Gravity, Urine 1.026 1.005 - 1.030   pH 8.0 5.0 - 8.0   Glucose, UA NEGATIVE NEGATIVE mg/dL   Hgb urine dipstick NEGATIVE NEGATIVE   Bilirubin Urine NEGATIVE NEGATIVE   Ketones, ur 5 (A) NEGATIVE mg/dL   Protein, ur 098 (A) NEGATIVE mg/dL   Nitrite NEGATIVE NEGATIVE   Leukocytes,Ua TRACE (A) NEGATIVE   RBC / HPF 6-10 0 - 5 RBC/hpf   WBC, UA 0-5 0 - 5 WBC/hpf   Bacteria, UA RARE (A) NONE SEEN   Squamous Epithelial / LPF 0-5 0 - 5   Mucus PRESENT     Comment: Performed at [facility], [address] [place], [place] [address]  Valproic acid level     Status: None   Collection Time: 10/05/21  9:44 AM  Result Value Ref Range   Valproic Acid Lvl 100 50.0 - 100.0 ug/mL    Comment: Performed at [facility], [address] [place], [place]  [address]  Ammonia     Status: Abnormal   Collection Time: 10/05/21  9:44 AM  Result Value Ref Range   Ammonia  51 (H) 9 - 35 umol/L    Comment: Performed at [facility], [address] [place], [place] [address]  Basic metabolic panel     Status: Abnormal   Collection Time: 10/10/21  9:25 PM  Result Value Ref Range   Sodium 140 135 - 145 mmol/L   Potassium 4.5 3.5 - 5.1 mmol/L   Chloride 100 98 - 111 mmol/L   CO2 32 22 - 32 mmol/L   Glucose, Bld 124 (H) 70 - 99 mg/dL    Comment: Glucose reference range applies only to samples taken after fasting for at least 8 hours.   BUN 16 6 - 20 mg/dL   Creatinine, Ser 6.04 0.61 - 1.24 mg/dL   Calcium 9.1 8.9 - 54.0 mg/dL   GFR, Estimated >98 >11 mL/min    Comment: (NOTE) Calculated using the CKD-EPI Creatinine Equation (2021)    Anion gap 8 5 - 15    Comment: Performed at [facility], [address] [place], [place] [address]  CBC     Status: Abnormal   Collection Time: 10/10/21  9:25 PM  Result Value Ref Range   WBC 6.6 4.0 - 10.5 K/uL   RBC 4.60 4.22 - 5.81 MIL/uL   Hemoglobin 13.3 13.0 - 17.0 g/dL   HCT 29.5 62.1 - 30.8 %   MCV 90.0 80.0 - 100.0 fL   MCH 28.9 26.0 - 34.0 pg   MCHC 32.1 30.0 - 36.0 g/dL   RDW 65.7 84.6 - 96.2 %   Platelets 149 (L) 150 - 400 K/uL   nRBC 0.0 0.0 - 0.2 %    Comment: Performed at [facility], [address], [place], [place] [address]  Troponin I (High Sensitivity)     Status: None   Collection Time: 10/10/21  9:25 PM  Result Value Ref Range   Troponin I (High Sensitivity) 4 <18 ng/L    Comment: (NOTE) Elevated high sensitivity troponin I (hsTnI) values and significant  changes across serial measurements may suggest ACS but many other  chronic and acute conditions are known to elevate hsTnI results.  Refer to the "Links" section for chest pain algorithms and additional  guidance. Performed at [facility], [address] [place], [place] [address]   Brain  natriuretic peptide     Status: Abnormal   Collection Time: 10/10/21  9:25 PM  Result Value Ref Range   B Natriuretic Peptide 103.9 (H) 0.0 - 100.0 pg/mL    Comment: Performed at [facility], [address] [place], [place] [address]  Troponin I (High Sensitivity)     Status: None   Collection Time: 10/11/21 12:25 AM  Result Value Ref Range   Troponin I (High Sensitivity) 4 <18 ng/L    Comment: (NOTE) Elevated high sensitivity troponin I (hsTnI) values and significant  changes across serial measurements may suggest ACS but many other  chronic and acute conditions are known to elevate hsTnI results.  Refer to the "Links" section for chest pain algorithms and additional  guidance. Performed at [facility], [address] [place], [place] [address]   Basic metabolic panel     Status: Abnormal   Collection Time: 10/15/21 10:11 AM  Result Value Ref Range   Sodium 138 135 - 145 mmol/L   Potassium 4.3 3.5 - 5.1 mmol/L   Chloride 101 98 - 111 mmol/L   CO2 30 22 - 32 mmol/L   Glucose, Bld 104 (H) 70 - 99 mg/dL  Comment: Glucose reference range applies only to samples taken after fasting for at least 8 hours.   BUN 13 6 - 20 mg/dL   Creatinine, Ser 1.61 0.61 - 1.24 mg/dL   Calcium 8.5 (L) 8.9 - 10.3 mg/dL   GFR, Estimated >09 >60 mL/min    Comment: (NOTE) Calculated using the CKD-EPI Creatinine Equation (2021)    Anion gap 7 5 - 15    Comment: Performed at [facility], [address], [place], [place] [address]   *Note: Due to a large number of results and/or encounters for the requested time period, some results have not been displayed. A complete set of results can be found in Results Review.    Radiology: No results found.  CT HEAD CODE STROKE WO CONTRAST  Result Date: 11/04/2021 CLINICAL DATA:  Code stroke. Atrial fibrillation, shortness of breath and lightheadedness EXAM: CT HEAD WITHOUT CONTRAST TECHNIQUE: Contiguous axial images were  obtained from the base of the skull through the vertex without intravenous contrast. RADIATION DOSE REDUCTION: This exam was performed according to the departmental dose-optimization program which includes automated exposure control, adjustment of the mA and/or kV according to patient size and/or use of iterative reconstruction technique. COMPARISON:  10/16/2021 FINDINGS: Brain: No evidence of acute infarction, hemorrhage, cerebral edema, mass, mass effect, or midline shift. Ventricles and sulci are normal for age. No extra-axial fluid collection. Vascular: No hyperdense vessel. Skull: Negative for fracture or focal lesion. Sinuses/Orbits: No acute finding. Other: The mastoid air cells are well aerated. ASPECTS [facility] Stroke Program Early CT Score) - Ganglionic level infarction (caudate, lentiform nuclei, internal capsule, insula, M1-M3 cortex): 7 - Supraganglionic infarction (M4-M6 cortex): 3 Total score (0-10 with 10 being normal): 10 IMPRESSION: 1. No acute intracranial process. 2. ASPECTS is 10 Code stroke imaging results were communicated on 11/04/2021 at 5:45 pm to provider ROBINSON via telephone, who verbally acknowledged these results. Electronically Signed   By: Wiliam Ke M.D.   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   Hyperammonemia (HCC) 09/24/2021   Generalized anxiety disorder 09/24/2021   Hyperkalemia 09/08/2021   Chronic atrial fibrillation with RVR (HCC)    Urinary obstruction 09/05/2021   Inability to urinate    Conversion disorder    Syncope and collapse 08/28/2021   History of gout 08/28/2021   Schizoaffective disorder, bipolar type (HCC) 08/14/2021   Chronic hypoxemic respiratory failure (HCC) 08/14/2021   OSA (obstructive sleep apnea)    Diabetes mellitus type 2, uncomplicated (HCC)    Left-sided  weakness 08/02/2021   Obesity, Class III, BMI 40-49.9 (morbid obesity) (HCC) 08/02/2021   Chronic atrial fibrillation (HCC) 08/02/2021   Primary hypertension 08/04/2020   1. OSA (obstructive sleep apnea) The patient has morbid obesity, loud snoring, nighttime gasping and choking, and excessive daytime sleepiness. He has morning headaches. He has comorbid atrial fibrillation, hypertension that could be exacerbated by untreated sleep apnea. Recommend PSG to further evaluate.   2. Hypersomnia Likely due to untreated sleep apnea. Await PSG  3. Obesity, Class III, BMI 40-49.9 (morbid obesity) (HCC) Obesity Counseling: Had a lengthy discussion regarding patients BMI and weight issues. Patient was instructed on portion control as well as increased activity. Also discussed caloric restrictions with trying to maintain intake less than 2000 Kcal. Discussions were made in accordance with the 5As of  weight management. Simple actions such as not eating late and if able to, taking a walk is suggested.      General Counseling: I have discussed the findings of the evaluation and examination with Roanna Epley.  I have also discussed any further diagnostic evaluation thatmay be needed or ordered today. Weylin verbalizes understanding of the findings of todays visit. We also reviewed his medications today and discussed drug interactions and side effects including but not limited excessive drowsiness and altered mental states. We also discussed that there is always a risk not just to him but also people around him. he has been encouraged to call the office with any questions or concerns that should arise related to todays visit.  No orders of the defined types were placed in this encounter.       I have personally obtained a history, examined the patient, evaluated laboratory and imaging results, formulated the assessment and plan and placed orders. This patient was seen today by Emmaline Kluver, PA-C in  collaboration with Dr. Freda Munro.   Yevonne Pax, MD [facility] Diplomate ABMS Pulmonary Critical Care Medicine and Sleep Medicine

## 2021-11-08 NOTE — ED Notes (Signed)
Per dawn tullock caregiver from group home took the patient home and dr Corky Downs had spoken to caregiver.

## 2021-11-08 NOTE — Telephone Encounter (Signed)
Patient did not show for his Heart Failure Clinic appointment on 11/08/21. Will attempt to reschedule.   

## 2021-11-09 ENCOUNTER — Emergency Department
Admission: EM | Admit: 2021-11-09 | Discharge: 2021-11-09 | Disposition: A | Discharge status: Home / Self Care | Payer: Medicaid Other | Attending: Emergency Medicine | Admitting: Emergency Medicine

## 2021-11-09 ENCOUNTER — Other Ambulatory Visit: Payer: Self-pay | Admitting: Family

## 2021-11-09 ENCOUNTER — Emergency Department: Payer: Medicaid Other

## 2021-11-09 ENCOUNTER — Ambulatory Visit (HOSPITAL_BASED_OUTPATIENT_CLINIC_OR_DEPARTMENT_OTHER): Payer: Medicaid Other | Admitting: Family

## 2021-11-09 ENCOUNTER — Encounter: Payer: Self-pay | Admitting: Family

## 2021-11-09 ENCOUNTER — Encounter: Payer: Self-pay | Admitting: Intensive Care

## 2021-11-09 ENCOUNTER — Other Ambulatory Visit: Payer: Self-pay

## 2021-11-09 ENCOUNTER — Ambulatory Visit
Admission: RE | Admit: 2021-11-09 | Discharge: 2021-11-09 | Disposition: A | Discharge status: Home / Self Care | Payer: Medicaid Other | Source: Ambulatory Visit | Attending: Family | Admitting: Family

## 2021-11-09 VITALS — BP 108/38 | HR 100 | Resp 16 | Ht 77.0 in | Wt >= 6400 oz

## 2021-11-09 DIAGNOSIS — Z8673 Personal history of transient ischemic attack (TIA), and cerebral infarction without residual deficits: Secondary | ICD-10-CM | POA: Insufficient documentation

## 2021-11-09 DIAGNOSIS — G4730 Sleep apnea, unspecified: Secondary | ICD-10-CM

## 2021-11-09 DIAGNOSIS — Z79899 Other long term (current) drug therapy: Secondary | ICD-10-CM | POA: Insufficient documentation

## 2021-11-09 DIAGNOSIS — I4819 Other persistent atrial fibrillation: Secondary | ICD-10-CM | POA: Insufficient documentation

## 2021-11-09 DIAGNOSIS — R790 Abnormal level of blood mineral: Secondary | ICD-10-CM | POA: Diagnosis not present

## 2021-11-09 DIAGNOSIS — R0602 Shortness of breath: Secondary | ICD-10-CM | POA: Insufficient documentation

## 2021-11-09 DIAGNOSIS — R21 Rash and other nonspecific skin eruption: Secondary | ICD-10-CM | POA: Insufficient documentation

## 2021-11-09 DIAGNOSIS — R55 Syncope and collapse: Secondary | ICD-10-CM | POA: Diagnosis not present

## 2021-11-09 DIAGNOSIS — R059 Cough, unspecified: Secondary | ICD-10-CM | POA: Insufficient documentation

## 2021-11-09 DIAGNOSIS — I4811 Longstanding persistent atrial fibrillation: Secondary | ICD-10-CM

## 2021-11-09 DIAGNOSIS — E785 Hyperlipidemia, unspecified: Secondary | ICD-10-CM | POA: Insufficient documentation

## 2021-11-09 DIAGNOSIS — F250 Schizoaffective disorder, bipolar type: Secondary | ICD-10-CM | POA: Diagnosis not present

## 2021-11-09 DIAGNOSIS — I110 Hypertensive heart disease with heart failure: Secondary | ICD-10-CM | POA: Insufficient documentation

## 2021-11-09 DIAGNOSIS — I4891 Unspecified atrial fibrillation: Secondary | ICD-10-CM | POA: Insufficient documentation

## 2021-11-09 DIAGNOSIS — R251 Tremor, unspecified: Secondary | ICD-10-CM | POA: Insufficient documentation

## 2021-11-09 DIAGNOSIS — I5023 Acute on chronic systolic (congestive) heart failure: Secondary | ICD-10-CM

## 2021-11-09 DIAGNOSIS — R002 Palpitations: Secondary | ICD-10-CM | POA: Insufficient documentation

## 2021-11-09 DIAGNOSIS — R062 Wheezing: Secondary | ICD-10-CM | POA: Insufficient documentation

## 2021-11-09 DIAGNOSIS — I10 Essential (primary) hypertension: Secondary | ICD-10-CM | POA: Diagnosis not present

## 2021-11-09 DIAGNOSIS — R0789 Other chest pain: Secondary | ICD-10-CM

## 2021-11-09 DIAGNOSIS — E119 Type 2 diabetes mellitus without complications: Secondary | ICD-10-CM | POA: Insufficient documentation

## 2021-11-09 DIAGNOSIS — R079 Chest pain, unspecified: Secondary | ICD-10-CM | POA: Insufficient documentation

## 2021-11-09 DIAGNOSIS — F32A Depression, unspecified: Secondary | ICD-10-CM | POA: Insufficient documentation

## 2021-11-09 DIAGNOSIS — I509 Heart failure, unspecified: Secondary | ICD-10-CM | POA: Insufficient documentation

## 2021-11-09 DIAGNOSIS — F259 Schizoaffective disorder, unspecified: Secondary | ICD-10-CM | POA: Insufficient documentation

## 2021-11-09 DIAGNOSIS — F419 Anxiety disorder, unspecified: Secondary | ICD-10-CM | POA: Diagnosis not present

## 2021-11-09 DIAGNOSIS — G9341 Metabolic encephalopathy: Secondary | ICD-10-CM | POA: Diagnosis present

## 2021-11-09 DIAGNOSIS — R42 Dizziness and giddiness: Secondary | ICD-10-CM | POA: Insufficient documentation

## 2021-11-09 DIAGNOSIS — F411 Generalized anxiety disorder: Secondary | ICD-10-CM | POA: Diagnosis present

## 2021-11-09 DIAGNOSIS — I482 Chronic atrial fibrillation, unspecified: Secondary | ICD-10-CM | POA: Diagnosis present

## 2021-11-09 LAB — CBC
HCT: 40.9 % (ref 39.0–52.0)
Hemoglobin: 13.5 g/dL (ref 13.0–17.0)
MCH: 29.3 pg (ref 26.0–34.0)
MCHC: 33 g/dL (ref 30.0–36.0)
MCV: 88.7 fL (ref 80.0–100.0)
Platelets: 205 10*3/uL (ref 150–400)
RBC: 4.61 MIL/uL (ref 4.22–5.81)
RDW: 14.7 % (ref 11.5–15.5)
WBC: 5.1 10*3/uL (ref 4.0–10.5)
nRBC: 0 % (ref 0.0–0.2)

## 2021-11-09 LAB — URINALYSIS, ROUTINE W REFLEX MICROSCOPIC
Bilirubin Urine: NEGATIVE
Glucose, UA: NEGATIVE mg/dL
Hgb urine dipstick: NEGATIVE
Ketones, ur: NEGATIVE mg/dL
Leukocytes,Ua: NEGATIVE
Nitrite: NEGATIVE
Protein, ur: NEGATIVE mg/dL
Specific Gravity, Urine: 1.008 (ref 1.005–1.030)
pH: 5 (ref 5.0–8.0)

## 2021-11-09 LAB — BASIC METABOLIC PANEL
Anion gap: 10 (ref 5–15)
BUN: 18 mg/dL (ref 6–20)
CO2: 29 mmol/L (ref 22–32)
Calcium: 9.1 mg/dL (ref 8.9–10.3)
Chloride: 100 mmol/L (ref 98–111)
Creatinine, Ser: 0.9 mg/dL (ref 0.61–1.24)
GFR, Estimated: >60 mL/min (ref >60)
Glucose, Bld: 138 mg/dL — ABNORMAL HIGH (ref 70–99)
Potassium: 4.7 mmol/L (ref 3.5–5.1)
Sodium: 139 mmol/L (ref 135–145)

## 2021-11-09 LAB — TROPONIN I (HIGH SENSITIVITY)
Troponin I (High Sensitivity): 3 ng/L (ref <18)
Troponin I (High Sensitivity): 3 ng/L (ref <18)

## 2021-11-09 LAB — BRAIN NATRIURETIC PEPTIDE: B Natriuretic Peptide: 261.3 pg/mL — ABNORMAL HIGH (ref 0.0–100.0)

## 2021-11-09 MED ORDER — POTASSIUM CHLORIDE CRYS ER 20 MEQ PO TBCR
EXTENDED_RELEASE_TABLET | ORAL | Status: AC
  Administered 2021-11-09: 40 meq via ORAL
  Filled 2021-11-09: qty 2

## 2021-11-09 MED ORDER — POTASSIUM CHLORIDE CRYS ER 20 MEQ PO TBCR: 40.0000 meq | EXTENDED_RELEASE_TABLET | Freq: Once | ORAL | Status: AC

## 2021-11-09 MED ORDER — METOPROLOL TARTRATE 50 MG PO TABS
50.0000 mg | ORAL_TABLET | Freq: Once | ORAL | Status: AC
  Administered 2021-11-09: 50 mg via ORAL
  Filled 2021-11-09: qty 1

## 2021-11-09 MED ORDER — FUROSEMIDE 10 MG/ML IJ SOLN
INTRAMUSCULAR | Status: AC
  Filled 2021-11-09: qty 8

## 2021-11-09 MED ORDER — FUROSEMIDE 10 MG/ML IJ SOLN
80.0000 mg | Freq: Once | INTRAMUSCULAR | Status: AC
  Administered 2021-11-09: 80 mg via INTRAVENOUS

## 2021-11-09 NOTE — Discharge Instructions (Signed)
Your work-up was reassuring we have given you a dose of metoprolol 50.  Return to the ER if you develop any worsening symptoms or any other concern

## 2021-11-09 NOTE — ED Notes (Signed)
Pt made concerning remarks about having a history of suicidal ideations. MD notified and pt referred to in house pysch team for a consult.

## 2021-11-09 NOTE — ED Provider Triage Note (Signed)
Emergency Medicine Provider Triage Evaluation Note  Daniel [name], a 31 y.o. male  was evaluated in triage.  Pt complains of chest pain and near syncope.  He presents from heart failure clinic where he was present to get IV Lasix and potassium infusions.  Review of Systems  Positive: Chest pain, near syncope Negative: FCS  Physical Exam  BP 112/86 (BP Location: Right Arm)   Pulse 78   Temp 98.8 F (37.1 C) (Oral)   Resp 20   Ht 6\' 5"  (1.956 m)   Wt (!) 183.3 kg   SpO2 95%   BMI 47.91 kg/m  Gen:   Awake, no distress  NAD Resp:  Normal effort CTA MSK:   Moves extremities without difficulty  CVS:  RRR  Medical Decision Making  Medically screening exam initiated at 6:54 PM.  Appropriate orders placed.  Beulah Matusek was informed that the remainder of the evaluation will be completed by another provider, this initial triage assessment does not replace that evaluation, and the importance of remaining in the ED until their evaluation is complete.  Patient to the ED for evaluation of chest tightness and near syncope.  He presents from heart failure clinic where he was present for treatment.   [name], PA-C 11/09/21 1855
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
## 2021-11-09 NOTE — Progress Notes (Signed)
Arrived via ambulation with walker from heart failure clinic upon arrival pt c/o dizziness and chest pain,SOB.O2 sat on room air 91% saline lock started and IV lasix given and po potassium given. Pt requesting EKG transferred to ED via stretcher, accompanied by RN.
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
## 2021-11-09 NOTE — ED Triage Notes (Signed)
Patient c/o chest tightness and near syncope. Sent from heart failure clinic to same day to get IV lasix and potassium and reports couldn't catch his breath while over there.

## 2021-11-09 NOTE — Patient Instructions (Addendum)
Continue weighing daily and call for an overnight weight gain of 3 pounds or more or a weekly weight gain of more than 5 pounds.   Drink around 60 ounces of fluid daily (4 regular size water bottles worth of fluids)

## 2021-11-09 NOTE — Consult Note (Signed)
[facility] Face-to-Face Psychiatry Consult   Reason for Consult: Near Syncope and Chest Pain Referring Physician: Dr.Funke Patient Identification: Daniel [name] MRN:  [id number] Principal Diagnosis: <principal problem not specified> Diagnosis:  Active Problems:   Obesity, Class III, BMI 40-49.9 (morbid obesity) (HCC)   Chronic atrial fibrillation (HCC)   Diabetes mellitus type 2, uncomplicated (HCC)   Schizoaffective disorder, bipolar type (HCC)   Acute metabolic encephalopathy   Generalized anxiety disorder   Total Time spent with patient: 45 minutes  Subjective: "I am trying to get admitted to [facility]). Daniel [name] is a 31 y.o. male patient presented to [facility] ED via [facility] from  [facility] group home. The patient is here voluntarily and is requesting to be admitted to a long-term hospitalization because he does not like his group home. The patient was educated on long-term inpatient admission, and he does not meet the criteria for inpatient admission. This provider saw the patient face-to-face; the chart was reviewed and consulted with Dr. Fuller Plan on 11/09/2021 due to the patient's care. It was discussed with the EDP that the patient does not meet the criteria to be admitted to the psychiatric inpatient unit.   On evaluation, the patient is alert and oriented x3, calm, cooperative, and mood-congruent with affect. The patient does not appear to be responding to internal or external stimuli. Neither is the patient presenting with any delusional thinking. The patient denies auditory or visual hallucinations. The patient admits to suicidal ideation, which is passive, but denies homicidal or self-harm ideations. The patient is not presenting with any psychotic or paranoid behaviors. During an encounter with the patient, he could answer questions appropriately. This Clinical research associate tried to obtain collateral from the patient group home staff. No one answered the call for all of the linked  numbers. I was left in a position where I could not leave a HIPPA-appropriate message.  HPI: Per Dr. Fuller Plan, Daniel [name] is a 31 y.o. male with frequent presentations to the ER who comes in from heart failure clinic for chest pain and near syncope.  Patient was getting IV Lasix and IV potassium infusion.  He reports that while being there he could not catch his breath.  He reports having some weight gain.  He did get the dose of IV Lasix but due to the chest pain patient sent over to the ER for evaluation.  Patient on 4 L at baseline.  He denies any missed doses of his Eliquis.  He denies any other concerns  Past Psychiatric History:  Schizoaffective disorder (HCC)    Seizure disorder (HCC)   TIA (transient ischemic attack)   Risk to Self:   Risk to Others:   Prior Inpatient Therapy:   Prior Outpatient Therapy:    Past Medical History:  Past Medical History:  Diagnosis Date   Atrial fibrillation (HCC)    Cardiomyopathy (HCC)    a. 07/2021 Echo: EF 50%, mild LVH, nl RV size/function. No significant valvular disease.   Chest pain    CHF (congestive heart failure) (HCC)    Conversion disorder    Current use of long term anticoagulation    Diabetes mellitus without complication (HCC)    Hyperlipidemia    Hypertension    OSA (obstructive sleep apnea)    Persistent atrial fibrillation and flutter (HCC)    a. CHA2DS2VASc = 3-4   Schizoaffective disorder (HCC)    Seizure disorder (HCC)    TIA (transient ischemic attack)    Urethral stricture    a.  08/2021 s/p cystoscopy and urethral dilation.    Past Surgical History:  Procedure Laterality Date   CYSTOSCOPY WITH URETHRAL DILATATION N/A 09/07/2021   Procedure: CYSTOSCOPY WITH URETHRAL DILATATION CATHETER PLACEMENT;  Surgeon: Riki AltesStoioff, Scott C, MD;  Location: [facility] ORS;  Service: Urology;  Laterality: N/A;   Family History: History reviewed. No pertinent family history. Family Psychiatric  History:  Social History:  Social History    Substance and Sexual Activity  Alcohol Use Not Currently     Social History   Substance and Sexual Activity  Drug Use Never    Social History   Socioeconomic History   Marital status: Single    Spouse name: Not on file   Number of children: Not on file   Years of education: Not on file   Highest education level: Not on file  Occupational History   Not on file  Tobacco Use   Smoking status: Former    Types: Cigarettes   Smokeless tobacco: Never  Vaping Use   Vaping Use: Never used  Substance and Sexual Activity   Alcohol use: Not Currently   Drug use: Never   Sexual activity: Not on file  Other Topics Concern   Not on file  Social History Narrative   Now living in a group home in [place].   Social Determinants of Health   Financial Resource Strain: Not on file  Food Insecurity: Not on file  Transportation Needs: Not on file  Physical Activity: Not on file  Stress: Not on file  Social Connections: Not on file   Additional Social History:    Allergies:   Allergies  Allergen Reactions   Haldol [Haloperidol] Other (See Comments)    SI   Abilify [Aripiprazole] Palpitations   Demerol [Meperidine Hcl] Hives    Labs:  Results for orders placed or performed during the hospital encounter of 11/09/21 (from the past 48 hour(s))  Basic metabolic panel     Status: Abnormal   Collection Time: 11/09/21  4:09 PM  Result Value Ref Range   Sodium 139 135 - 145 mmol/L   Potassium 4.7 3.5 - 5.1 mmol/L   Chloride 100 98 - 111 mmol/L   CO2 29 22 - 32 mmol/L   Glucose, Bld 138 (H) 70 - 99 mg/dL    Comment: Glucose reference range applies only to samples taken after fasting for at least 8 hours.   BUN 18 6 - 20 mg/dL   Creatinine, Ser 1.610.90 0.61 - 1.24 mg/dL   Calcium 9.1 8.9 - 09.610.3 mg/dL   GFR, Estimated >04>60 >54>60 mL/min    Comment: (NOTE) Calculated using the CKD-EPI Creatinine Equation (2021)    Anion gap 10 5 - 15    Comment: Performed at [facility], [address] [place], [place] [address]  CBC     Status: None   Collection Time: 11/09/21  4:09 PM  Result Value Ref Range   WBC 5.1 4.0 - 10.5 K/uL   RBC 4.61 4.22 - 5.81 MIL/uL   Hemoglobin 13.5 13.0 - 17.0 g/dL   HCT 91.440.9 78.239.0 - 95.652.0 %   MCV 88.7 80.0 - 100.0 fL   MCH 29.3 26.0 - 34.0 pg   MCHC 33.0 30.0 - 36.0 g/dL   RDW 21.314.7 08.611.5 - 57.815.5 %   Platelets 205 150 - 400 K/uL   nRBC 0.0 0.0 - 0.2 %    Comment: Performed at [facility], [address] [place], [place] [address]  Troponin I (  High Sensitivity)     Status: None   Collection Time: 11/09/21  4:09 PM  Result Value Ref Range   Troponin I (High Sensitivity) 3 <18 ng/L    Comment: (NOTE) Elevated high sensitivity troponin I (hsTnI) values and significant  changes across serial measurements may suggest ACS but many other  chronic and acute conditions are known to elevate hsTnI results.  Refer to the "Links" section for chest pain algorithms and additional  guidance. Performed at [facility], [address] [place], [place] [address]   Urinalysis, Routine w reflex microscopic     Status: Abnormal   Collection Time: 11/09/21  4:09 PM  Result Value Ref Range   Color, Urine STRAW (A) YELLOW   APPearance CLEAR (A) CLEAR   Specific Gravity, Urine 1.008 1.005 - 1.030   pH 5.0 5.0 - 8.0   Glucose, UA NEGATIVE NEGATIVE mg/dL   Hgb urine dipstick NEGATIVE NEGATIVE   Bilirubin Urine NEGATIVE NEGATIVE   Ketones, ur NEGATIVE NEGATIVE mg/dL   Protein, ur NEGATIVE NEGATIVE mg/dL   Nitrite NEGATIVE NEGATIVE   Leukocytes,Ua NEGATIVE NEGATIVE    Comment: Performed at [facility], [address], [place], [place] [address]  Troponin I (High Sensitivity)     Status: None   Collection Time: 11/09/21  6:49 PM  Result Value Ref Range   Troponin I (High Sensitivity) 3 <18 ng/L    Comment: (NOTE) Elevated high sensitivity troponin I (hsTnI) values and significant  changes across serial measurements may  suggest ACS but many other  chronic and acute conditions are known to elevate hsTnI results.  Refer to the "Links" section for chest pain algorithms and additional  guidance. Performed at [facility], [address] [place], [place] [address]     No current facility-administered medications for this encounter.   Current Outpatient Medications  Medication Sig Dispense Refill   metoprolol tartrate (LOPRESSOR) 50 MG tablet Take 50 mg by mouth 2 (two) times daily.     albuterol (VENTOLIN HFA) 108 (90 Base) MCG/ACT inhaler Inhale 2 puffs into the lungs every 6 (six) hours as needed for wheezing. (Patient not taking: Reported on 11/09/2021)     allopurinol (ZYLOPRIM) 300 MG tablet Take 1 tablet (300 mg total) by mouth daily. (Patient not taking: Reported on 11/09/2021) 30 tablet 0   aspirin 325 MG tablet Take 325 mg by mouth daily. (Patient not taking: Reported on 11/04/2021)     atorvastatin (LIPITOR) 80 MG tablet Take 80 mg by mouth daily. (Patient not taking: Reported on 11/04/2021)     baclofen (LIORESAL) 10 MG tablet Take 1 tablet (10 mg total) by mouth 2 (two) times daily as needed for muscle spasms. Home med. (Patient not taking: Reported on 11/09/2021) 30 each 0   cephALEXin (KEFLEX) 500 MG capsule Take 1 capsule (500 mg total) by mouth 2 (two) times daily. (Patient not taking: Reported on 11/09/2021) 14 capsule 0   Cranberry-Vitamin C (AZO CRANBERRY URINARY TRACT PO) Take 1 tablet by mouth 3 (three) times daily as needed. (Patient not taking: Reported on 11/09/2021)     cyclobenzaprine (FLEXERIL) 5 MG tablet Take 5 mg by mouth 3 (three) times daily as needed for muscle spasms.     diclofenac Sodium (VOLTAREN) 1 % GEL Apply 2 g topically 4 (four) times daily. (Patient not taking: Reported on 11/09/2021)     diltiazem (CARDIZEM CD) 360 MG 24 hr capsule Take 1 capsule (360 mg total) by mouth daily. 30 capsule  1   divalproex (DEPAKOTE) 500 MG DR tablet Take 2 tablets (1,000 mg total) by  mouth 2 (two) times daily.     ELIQUIS 5 MG TABS tablet Take 5 mg by mouth 2 (two) times daily.     escitalopram (LEXAPRO) 10 MG tablet Take 20 mg by mouth daily.     fluticasone (FLONASE) 50 MCG/ACT nasal spray Place 2 sprays into both nostrils 2 (two) times daily.     furosemide (LASIX) 20 MG tablet Take 20 mg by mouth daily.     hydrochlorothiazide (HYDRODIURIL) 25 MG tablet Take 25 mg by mouth daily. (Patient not taking: Reported on 11/04/2021)     HYDROcodone-acetaminophen (NORCO/VICODIN) 5-325 MG tablet Take 1 tablet by mouth every 4 (four) hours as needed. (Patient not taking: Reported on 11/04/2021) 15 tablet 0   hydrOXYzine (ATARAX) 25 MG tablet Take 25 mg by mouth daily. At noon     hydrOXYzine (ATARAX) 50 MG tablet Take 50 mg by mouth in the morning and at bedtime.     ibuprofen (ADVIL) 800 MG tablet Take 800 mg by mouth every 8 (eight) hours as needed. (Patient not taking: Reported on 11/04/2021)     INVEGA 9 MG 24 hr tablet Take 9 mg by mouth every morning.     loratadine (CLARITIN) 10 MG tablet Take 10 mg by mouth daily.     melatonin 3 MG TABS tablet Take 3 mg by mouth at bedtime.     naproxen (NAPROSYN) 500 MG tablet Take 1 tablet (500 mg total) by mouth 2 (two) times daily with a meal. 20 tablet 2   omeprazole (PRILOSEC) 20 MG capsule Take 20 mg by mouth daily.     ondansetron (ZOFRAN-ODT) 4 MG disintegrating tablet Take 1 tablet (4 mg total) by mouth every 6 (six) hours as needed for nausea or vomiting. (Patient not taking: Reported on 11/09/2021) 20 tablet 0   potassium chloride SA (KLOR-CON M) 20 MEQ tablet Take 1 tablet (20 mEq total) by mouth daily for 7 days. 7 tablet 0   promethazine (PHENERGAN) 25 MG tablet Take 25 mg by mouth every 6 (six) hours as needed for nausea or vomiting.     sacubitril-valsartan (ENTRESTO) 24-26 MG Take 1 tablet by mouth 2 (two) times daily. 60 tablet 3   tamsulosin (FLOMAX) 0.4 MG CAPS capsule Take 1 capsule (0.4 mg total) by mouth daily. 30 capsule 2    traMADol (ULTRAM) 50 MG tablet Take 50 mg by mouth every 6 (six) hours as needed. (Patient not taking: Reported on 11/04/2021)      Musculoskeletal: Strength & Muscle Tone: within normal limits Gait & Station: normal Patient leans: N/A  Psychiatric Specialty Exam:  Presentation  General Appearance: Appropriate for Environment  Eye Contact:Good  Speech:Clear and Coherent  Speech Volume:Normal  Handedness:Right   Mood and Affect  Mood:Depressed  Affect:Depressed   Thought Process  Thought Processes:Coherent  Descriptions of Associations:Intact  Orientation:Full (Time, Place and Person)  Thought Content:Logical  History of Schizophrenia/Schizoaffective disorder:No  Duration of Psychotic Symptoms:Less than six months  Hallucinations:Hallucinations: None  Ideas of Reference:None  Suicidal Thoughts:Suicidal Thoughts: Yes, Passive SI Passive Intent and/or Plan: Without Intent; Without Plan; Without Means to Carry Out; Without Access to Means  Homicidal Thoughts:Homicidal Thoughts: No   Sensorium  Memory:Immediate Fair; Recent Fair; Remote Fair  Judgment:Poor  Insight:Poor   Executive Functions  Concentration:Poor  Attention Span:Poor  Recall:Poor  Fund of Knowledge:Poor  Language:Poor   Psychomotor Activity  Psychomotor Activity:Psychomotor Activity: Normal  Assets  Assets:Communication Skills; Desire for Improvement; Leisure Time; Physical Health; Resilience; Social Support   Sleep  Sleep:Sleep: Good Number of Hours of Sleep: 8   Physical Exam: Physical Exam Constitutional:      Appearance: He is obese.  HENT:     Head: Normocephalic and atraumatic.     Right Ear: External ear normal.     Left Ear: External ear normal.     Nose: Nose normal.     Mouth/Throat:     Mouth: Mucous membranes are moist.  Cardiovascular:     Rate and Rhythm: Normal rate.     Pulses: Normal pulses.  Pulmonary:     Effort: Pulmonary effort is  normal.  Musculoskeletal:        General: Normal range of motion.     Cervical back: Neck supple.  Neurological:     General: No focal deficit present.     Mental Status: He is alert and oriented to person, place, and time.  Psychiatric:        Attention and Perception: Attention and perception normal.        Mood and Affect: Affect is blunt and flat.        Speech: Speech normal.        Behavior: Behavior is agitated and withdrawn.        Thought Content: Thought content normal.        Cognition and Memory: Memory normal.        Judgment: Judgment is impulsive and inappropriate.    Review of Systems  Psychiatric/Behavioral:  Positive for depression. The patient is nervous/anxious.   All other systems reviewed and are negative.  Blood pressure 106/75, pulse (!) 55, temperature 98.8 F (37.1 C), temperature source Oral, resp. rate 15, height 6\' 5"  (1.956 m), weight (!) 183.3 kg, SpO2 95 %. Body mass index is 47.91 kg/m.  Treatment Plan Summary: Plan Patient does not meet criteria for psychiatric inpatient admission  Disposition: No evidence of imminent risk to self or others at present.   Patient does not meet criteria for psychiatric inpatient admission. Supportive therapy provided about ongoing stressors. Discussed crisis plan, support from social network, calling 911, coming to the Emergency Department, and calling Suicide Hotline.  , NP 11/09/2021 11:25 PM

## 2021-11-09 NOTE — ED Provider Notes (Addendum)
[facility] Provider Note    Event Date/Time   First MD Initiated Contact with Patient 11/09/21 2000     (approximate)   History   Near Syncope and Chest Pain   HPI  Daniel [name] is a 31 y.o. male with frequent presentations to the ER who comes in from heart failure clinic for chest pain and near syncope.  Patient was getting IV Lasix and IV potassium infusion.  He reports that while being there he could not catch his breath.  He reports having some weight gain.  He did get the dose of IV Lasix but due to the chest pain patient sent over to the ER for evaluation.  Patient on 4 L at baseline.  He denies any missed doses of his Eliquis.  He denies any other concerns   Physical Exam   Triage Vital Signs: ED Triage Vitals  Enc Vitals Group     BP 11/09/21 1553 117/71     Pulse Rate 11/09/21 1553 98     Resp 11/09/21 1553 (!) 22     Temp 11/09/21 1553 98.8 F (37.1 C)     Temp Source 11/09/21 1553 Oral     SpO2 11/09/21 1553 93 %     Weight 11/09/21 1607 (!) 404 lb (183.3 kg)     Height 11/09/21 1607 6\' 5"  (1.956 m)     Head Circumference --      Peak Flow --      Pain Score 11/09/21 1606 7     Pain Loc --      Pain Edu? --      Excl. in GC? --     Most recent vital signs: Vitals:   11/09/21 1553 11/09/21 1849  BP: 117/71 112/86  Pulse: 98 78  Resp: (!) 22 20  Temp: 98.8 F (37.1 C)   SpO2: 93% 95%     General: Awake, no distress.  CV:  Good peripheral perfusion.  Resp:  Normal effort.  Patient on his baseline 4 L Abd:  No distention.  Other:  No swelling the legs.  No work of breathing   ED Results / Procedures / Treatments   Labs (all labs ordered are listed, but only abnormal results are displayed) Labs Reviewed  BASIC METABOLIC PANEL - Abnormal; Notable for the following components:      Result Value   Glucose, Bld 138 (*)    All other components within normal limits  URINALYSIS, ROUTINE W REFLEX MICROSCOPIC - Abnormal;  Notable for the following components:   Color, Urine STRAW (*)    APPearance CLEAR (*)    All other components within normal limits  CBC  TROPONIN I (HIGH SENSITIVITY)  TROPONIN I (HIGH SENSITIVITY)     EKG  My interpretation of EKG:  Atrial fibrillation rate of 98 without any ST elevation but does have T wave inversions in the inferior lateral leads, normal intervals, I reviewed prior EKG on 6/9 he had similar T wave inversions  RADIOLOGY I have reviewed the xray personally and interpreted and no evidence of pneumonia or pulmonary edema  PROCEDURES:  Critical Care performed: No  .1-3 Lead EKG Interpretation  Performed by: 8/9, MD Authorized by: Concha Se, MD     Interpretation: normal     ECG rate:  70   ECG rate assessment: normal     Rhythm: sinus rhythm     Ectopy: none     Conduction: normal  MEDICATIONS ORDERED IN ED: Medications  metoprolol tartrate (LOPRESSOR) tablet 50 mg (has no administration in time range)     IMPRESSION / MDM / ASSESSMENT AND PLAN / ED COURSE  I reviewed the triage vital signs and the nursing notes.   Patient's presentation is most consistent with acute presentation with potential threat to life or bodily function.   Patient comes in with some chest tightness and near syncopal while at heart failure clinic.  Differential includes ACS, CHF, Electra abnormalities.    Patient well-known to the emergency room here frequently.  His BNP is slightly elevated.  But no evidence of pulmonary edema on chest x-ray.  BMP shows normal potassium of 4.7.  Cardiac markers are negative x2.  CBC reassuring.  UA without evidence of UTI  Patient requesting admission but explained to patient that he does not meet any admission criteria given he is stable on his baseline oxygen and cardiac markers are negative.  Explained to patient that at this time we will be sending him back to his group home.  Patient was given his home dose of  metoprolol due to his heart rates that were slightly elevated given he was due for his dose.  Patient expressed understanding   Upon trying to discharge patient patient is requesting to talk to psychiatry with some vague reports of SI.  Suspect patient is malingering given he is asked multiple times to be admitted.  Psychiatry came and evaluated patient and cleared patient  The patient is on the cardiac monitor to evaluate for evidence of arrhythmia and/or significant heart rate changes.      FINAL CLINICAL IMPRESSION(S) / ED DIAGNOSES   Final diagnoses:  Atypical chest pain  Atrial fibrillation, unspecified type (HCC)     Rx / DC Orders   ED Discharge Orders     None        Note:  This document was prepared using Dragon voice recognition software and may include unintentional dictation errors.   Concha Se, MD 11/09/21 2029    Concha Se, MD 11/09/21 2206

## 2021-11-10 ENCOUNTER — Other Ambulatory Visit: Payer: Self-pay | Admitting: Family

## 2021-11-10 ENCOUNTER — Other Ambulatory Visit: Payer: Self-pay | Admitting: Licensed Clinical Social Worker

## 2021-11-10 ENCOUNTER — Encounter: Payer: Self-pay | Admitting: Family

## 2021-11-10 ENCOUNTER — Ambulatory Visit: Payer: Medicaid Other | Admitting: Family

## 2021-11-10 ENCOUNTER — Ambulatory Visit: Payer: Medicaid Other | Attending: Family | Admitting: Family

## 2021-11-10 VITALS — BP 115/81 | HR 79 | Resp 16 | Ht 77.0 in | Wt >= 6400 oz

## 2021-11-10 DIAGNOSIS — I4819 Other persistent atrial fibrillation: Secondary | ICD-10-CM | POA: Diagnosis not present

## 2021-11-10 DIAGNOSIS — Z79899 Other long term (current) drug therapy: Secondary | ICD-10-CM | POA: Insufficient documentation

## 2021-11-10 DIAGNOSIS — Z720 Tobacco use: Secondary | ICD-10-CM | POA: Insufficient documentation

## 2021-11-10 DIAGNOSIS — Z7902 Long term (current) use of antithrombotics/antiplatelets: Secondary | ICD-10-CM | POA: Diagnosis not present

## 2021-11-10 DIAGNOSIS — Z7984 Long term (current) use of oral hypoglycemic drugs: Secondary | ICD-10-CM | POA: Diagnosis not present

## 2021-11-10 DIAGNOSIS — I5022 Chronic systolic (congestive) heart failure: Secondary | ICD-10-CM | POA: Diagnosis not present

## 2021-11-10 DIAGNOSIS — E785 Hyperlipidemia, unspecified: Secondary | ICD-10-CM | POA: Diagnosis not present

## 2021-11-10 DIAGNOSIS — Z7901 Long term (current) use of anticoagulants: Secondary | ICD-10-CM | POA: Insufficient documentation

## 2021-11-10 DIAGNOSIS — F250 Schizoaffective disorder, bipolar type: Secondary | ICD-10-CM | POA: Diagnosis not present

## 2021-11-10 DIAGNOSIS — Z8673 Personal history of transient ischemic attack (TIA), and cerebral infarction without residual deficits: Secondary | ICD-10-CM | POA: Insufficient documentation

## 2021-11-10 DIAGNOSIS — G4730 Sleep apnea, unspecified: Secondary | ICD-10-CM | POA: Diagnosis not present

## 2021-11-10 DIAGNOSIS — R45851 Suicidal ideations: Secondary | ICD-10-CM | POA: Diagnosis not present

## 2021-11-10 DIAGNOSIS — I10 Essential (primary) hypertension: Secondary | ICD-10-CM | POA: Diagnosis not present

## 2021-11-10 DIAGNOSIS — R600 Localized edema: Secondary | ICD-10-CM | POA: Insufficient documentation

## 2021-11-10 DIAGNOSIS — Z8669 Personal history of other diseases of the nervous system and sense organs: Secondary | ICD-10-CM | POA: Diagnosis not present

## 2021-11-10 DIAGNOSIS — F209 Schizophrenia, unspecified: Secondary | ICD-10-CM | POA: Insufficient documentation

## 2021-11-10 DIAGNOSIS — G8929 Other chronic pain: Secondary | ICD-10-CM | POA: Insufficient documentation

## 2021-11-10 DIAGNOSIS — I4811 Longstanding persistent atrial fibrillation: Secondary | ICD-10-CM | POA: Diagnosis not present

## 2021-11-10 DIAGNOSIS — E119 Type 2 diabetes mellitus without complications: Secondary | ICD-10-CM | POA: Diagnosis not present

## 2021-11-10 DIAGNOSIS — I110 Hypertensive heart disease with heart failure: Secondary | ICD-10-CM | POA: Insufficient documentation

## 2021-11-10 MED ORDER — DAPAGLIFLOZIN PROPANEDIOL 10 MG PO TABS: 10.0000 mg | ORAL_TABLET | Freq: Every day | ORAL | 3 refills | Status: DC

## 2021-11-10 MED ORDER — DAPAGLIFLOZIN PROPANEDIOL 10 MG PO TABS: 10.0000 mg | ORAL_TABLET | Freq: Every day | ORAL | 5 refills | Status: DC

## 2021-11-10 MED ORDER — DAPAGLIFLOZIN PROPANEDIOL 10 MG PO TABS
10.0000 mg | ORAL_TABLET | Freq: Every day | ORAL | 3 refills | Status: DC
Start: 2021-11-10 — End: 2022-05-21
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
## 2021-11-10 NOTE — Progress Notes (Signed)
Printed farxiga RX for patient assistance application 

## 2021-11-10 NOTE — Patient Instructions (Addendum)
Continue weighing daily and call for an overnight weight gain of 3 pounds or more or a weekly weight gain of more than 5 pounds.   Begin farxiga 10mg  as 1 tablet every morning   Please make appointment for mental health

## 2021-11-11 ENCOUNTER — Other Ambulatory Visit: Payer: Self-pay | Admitting: Obstetrics and Gynecology

## 2021-11-15 ENCOUNTER — Telehealth: Payer: Self-pay | Admitting: Family

## 2021-11-15 NOTE — Telephone Encounter (Signed)
Patient was denied patient assistance for Daniel [name] as his insurance should cover perscription.   Daniel [name], NT

## 2021-11-19 ENCOUNTER — Emergency Department: Payer: Medicaid Other

## 2021-11-19 ENCOUNTER — Emergency Department
Admission: EM | Admit: 2021-11-19 | Discharge: 2021-11-20 | Disposition: A | Discharge status: Home / Self Care | Payer: Medicaid Other | Attending: Emergency Medicine | Admitting: Emergency Medicine

## 2021-11-19 DIAGNOSIS — R45851 Suicidal ideations: Secondary | ICD-10-CM | POA: Insufficient documentation

## 2021-11-19 DIAGNOSIS — Z1339 Encounter for screening examination for other mental health and behavioral disorders: Secondary | ICD-10-CM | POA: Insufficient documentation

## 2021-11-19 DIAGNOSIS — R002 Palpitations: Secondary | ICD-10-CM | POA: Diagnosis not present

## 2021-11-19 DIAGNOSIS — F411 Generalized anxiety disorder: Secondary | ICD-10-CM | POA: Diagnosis not present

## 2021-11-19 DIAGNOSIS — R0602 Shortness of breath: Secondary | ICD-10-CM | POA: Insufficient documentation

## 2021-11-19 DIAGNOSIS — I4891 Unspecified atrial fibrillation: Secondary | ICD-10-CM | POA: Diagnosis not present

## 2021-11-19 DIAGNOSIS — R300 Dysuria: Secondary | ICD-10-CM | POA: Diagnosis not present

## 2021-11-19 DIAGNOSIS — R0789 Other chest pain: Secondary | ICD-10-CM | POA: Diagnosis not present

## 2021-11-19 DIAGNOSIS — I480 Paroxysmal atrial fibrillation: Secondary | ICD-10-CM

## 2021-11-19 DIAGNOSIS — N3001 Acute cystitis with hematuria: Secondary | ICD-10-CM | POA: Insufficient documentation

## 2021-11-19 DIAGNOSIS — M545 Low back pain, unspecified: Secondary | ICD-10-CM | POA: Insufficient documentation

## 2021-11-19 DIAGNOSIS — I509 Heart failure, unspecified: Secondary | ICD-10-CM | POA: Diagnosis not present

## 2021-11-19 DIAGNOSIS — F250 Schizoaffective disorder, bipolar type: Secondary | ICD-10-CM | POA: Diagnosis present

## 2021-11-19 LAB — URINALYSIS, ROUTINE W REFLEX MICROSCOPIC
Bilirubin Urine: NEGATIVE
Glucose, UA: >=500 mg/dL — AB
Hgb urine dipstick: NEGATIVE
Ketones, ur: 5 mg/dL — AB
Nitrite: NEGATIVE
Protein, ur: 30 mg/dL — AB
Specific Gravity, Urine: 1.032 — ABNORMAL HIGH (ref 1.005–1.030)
pH: 5 (ref 5.0–8.0)

## 2021-11-19 LAB — CBC
HCT: 38.5 % — ABNORMAL LOW (ref 39.0–52.0)
Hemoglobin: 12.5 g/dL — ABNORMAL LOW (ref 13.0–17.0)
MCH: 29.1 pg (ref 26.0–34.0)
MCHC: 32.5 g/dL (ref 30.0–36.0)
MCV: 89.5 fL (ref 80.0–100.0)
Platelets: 184 10*3/uL (ref 150–400)
RBC: 4.3 MIL/uL (ref 4.22–5.81)
RDW: 14.6 % (ref 11.5–15.5)
WBC: 7.2 10*3/uL (ref 4.0–10.5)
nRBC: 0 % (ref 0.0–0.2)

## 2021-11-19 LAB — COMPREHENSIVE METABOLIC PANEL
ALT: 19 U/L (ref 0–44)
AST: 26 U/L (ref 15–41)
Albumin: 3.7 g/dL (ref 3.5–5.0)
Alkaline Phosphatase: 63 U/L (ref 38–126)
Anion gap: 10 (ref 5–15)
BUN: 26 mg/dL — ABNORMAL HIGH (ref 6–20)
CO2: 28 mmol/L (ref 22–32)
Calcium: 9.2 mg/dL (ref 8.9–10.3)
Chloride: 99 mmol/L (ref 98–111)
Creatinine, Ser: 1.13 mg/dL (ref 0.61–1.24)
GFR, Estimated: >60 mL/min (ref >60)
Glucose, Bld: 122 mg/dL — ABNORMAL HIGH (ref 70–99)
Potassium: 3.9 mmol/L (ref 3.5–5.1)
Sodium: 137 mmol/L (ref 135–145)
Total Bilirubin: 0.4 mg/dL (ref 0.3–1.2)
Total Protein: 6.8 g/dL (ref 6.5–8.1)

## 2021-11-19 LAB — URINE DRUG SCREEN, QUALITATIVE (ARMC ONLY)
Amphetamines, Ur Screen: NOT DETECTED
Barbiturates, Ur Screen: NOT DETECTED
Benzodiazepine, Ur Scrn: NOT DETECTED
Cannabinoid 50 Ng, Ur ~~LOC~~: NOT DETECTED
Cocaine Metabolite,Ur ~~LOC~~: NOT DETECTED
MDMA (Ecstasy)Ur Screen: NOT DETECTED
Methadone Scn, Ur: NOT DETECTED
Opiate, Ur Screen: NOT DETECTED
Phencyclidine (PCP) Ur S: NOT DETECTED
Tricyclic, Ur Screen: POSITIVE — AB

## 2021-11-19 LAB — ETHANOL: Alcohol, Ethyl (B): <10 mg/dL (ref <10)

## 2021-11-19 LAB — BRAIN NATRIURETIC PEPTIDE: B Natriuretic Peptide: 108.7 pg/mL — ABNORMAL HIGH (ref 0.0–100.0)

## 2021-11-19 LAB — ACETAMINOPHEN LEVEL: Acetaminophen (Tylenol), Serum: <10 ug/mL — ABNORMAL LOW (ref 10–30)

## 2021-11-19 LAB — SALICYLATE LEVEL: Salicylate Lvl: <7 mg/dL — ABNORMAL LOW (ref 7.0–30.0)

## 2021-11-19 LAB — TROPONIN I (HIGH SENSITIVITY)
Troponin I (High Sensitivity): 3 ng/L (ref <18)
Troponin I (High Sensitivity): 4 ng/L (ref <18)

## 2021-11-19 MED ORDER — OXYCODONE-ACETAMINOPHEN 5-325 MG PO TABS
1.0000 | ORAL_TABLET | Freq: Once | ORAL | Status: AC
  Administered 2021-11-19: 1 via ORAL
  Filled 2021-11-19: qty 1

## 2021-11-19 MED ORDER — DILTIAZEM HCL 25 MG/5ML IV SOLN
10.0000 mg | Freq: Once | INTRAVENOUS | Status: AC
  Administered 2021-11-19: 10 mg via INTRAVENOUS
  Filled 2021-11-19: qty 5

## 2021-11-19 MED ORDER — SODIUM CHLORIDE 0.9 % IV SOLN
2.0000 g | Freq: Once | INTRAVENOUS | Status: AC
  Administered 2021-11-19: 2 g via INTRAVENOUS
  Filled 2021-11-19: qty 20

## 2021-11-19 NOTE — BH Assessment (Signed)
Comprehensive Clinical Assessment (CCA) Note  11/19/2021 Daniel [name] [id number]  Chief Complaint: Patient is a 31 year old male presenting to [facility] ED voluntarily. Per triage note Pt comes to ED via [facility] after he reportedly threatened to jump from the vehicle he was in because the care giver would not bring him to the ED immediately. Reported to EMS that he was suicidal due to his caregivers. On arrival pt is ambulatory in no distress. States that he has had back pain and burning with urination that he has had for "some time." Pt states that he also is SI with no plan due to caregivers restraining him when he tries to leave ALF home. Pt is asked by this nurse if he is informing them of where he is going, states no and that he is old enough to not tell anyone, seems to imply that he threatens to run away from home when this occurs. States he has back pain and burning with urination. Pt is taking part in manipulative behaviors to get things he wants and is having attention seeking behaviors on arrival. During assessment patient appears alert and oriented x4, calm and cooperative. Patient reports "I tried to jump out of a moving vehicle because my caregivers are not being good hostesses, I'm cooped up in that house and I need a higher level of care but my legal guardian wants me there, I belong in a nursing facility." "Today was just a bad day." Patient reports "my caregiver wasn't going to bring me to the hospital so I told him that I was going to jump out the vehicle." Patient reports continued SI "because the stress is like a tidal wave it's going to consume me."  Chief Complaint  Patient presents with   Suicidal   Back Pain   Visit Diagnosis: Schizoaffective disorder, bipolar type    CCA Screening, Triage and Referral (STR)  Patient Reported Information How did you hear about [place]? Self  Referral name: No data recorded Referral phone number: No data recorded  Whom do you see for routine  medical problems? No data recorded Practice/Facility Name: No data recorded Practice/Facility Phone Number: No data recorded Name of Contact: No data recorded Contact Number: No data recorded Contact Fax Number: No data recorded Prescriber Name: No data recorded Prescriber Address (if known): No data recorded  What Is the Reason for Your Visit/Call Today? Patient arrives from group home voluntarily due to patient believing that he needs a higher level of care "I need to be in a nursing home."  How Long Has This Been Causing You Problems? > than 6 months  What Do You Feel Would Help You the Most Today? Housing Assistance   Have You Recently Been in Any Inpatient Treatment (Hospital/Detox/Crisis Center/28-Day Program)? No data recorded Name/Location of Program/Hospital:No data recorded How Long Were You There? No data recorded When Were You Discharged? No data recorded  Have You Ever Received Services From [facility] Before? No data recorded Who Do You See at [facility]? No data recorded  Have You Recently Had Any Thoughts About Hurting Yourself? Yes  Are You Planning to Commit Suicide/Harm Yourself At This time? No   Have you Recently Had Thoughts About Hurting Someone Daniel [name]? No  Explanation: No data recorded  Have You Used Any Alcohol or Drugs in the Past 24 Hours? No  How Long Ago Did You Use Drugs or Alcohol? No data recorded What Did You Use and How Much? No data recorded  Do You  Currently Have a Therapist/Psychiatrist? -- (Unknown)  Name of Therapist/Psychiatrist: Unknown   Have You Been Recently Discharged From Any Office Practice or Programs? No  Explanation of Discharge From Practice/Program: No data recorded    CCA Screening Triage Referral Assessment Type of Contact: Face-to-Face  Is this Initial or Reassessment? No data recorded Date Telepsych consult ordered in [facility]:  No data recorded Time Telepsych consult ordered in [facility]:  No data recorded  Patient  Reported Information Reviewed? No data recorded Patient Left Without Being Seen? No data recorded Reason for Not Completing Assessment: No data recorded  Collateral Involvement: Florencia Reasons, Legal Guardian   Does Patient Have a Court Appointed Legal Guardian? No data recorded Name and Contact of Legal Guardian: No data recorded If Minor and Not Living with Parent(s), Who has Custody? n/a  Is CPS involved or ever been involved? Never  Is APS involved or ever been involved? Never   Patient Determined To Be At Risk for Harm To Self or Others Based on Review of Patient Reported Information or Presenting Complaint? No  Method: No data recorded Availability of Means: No data recorded Intent: No data recorded Notification Required: No data recorded Additional Information for Danger to Others Potential: No data recorded Additional Comments for Danger to Others Potential: No data recorded Are There Guns or Other Weapons in Your Home? No data recorded Types of Guns/Weapons: No data recorded Are These Weapons Safely Secured?                            No data recorded Who Could Verify You Are Able To Have These Secured: No data recorded Do You Have any Outstanding Charges, Pending Court Dates, Parole/Probation? No data recorded Contacted To Inform of Risk of Harm To Self or Others: Unable to Contact:   Location of Assessment: [facility] ED   Does Patient Present under Involuntary Commitment? No  IVC Papers Initial File Date: 08/20/21   [place] of Residence: [place]   Patient Currently Receiving the Following Services: Group Home; Medication Management   Determination of Need: Emergent (2 hours)   Options For Referral: Therapeutic Triage Services; ED Referral     CCA Biopsychosocial Intake/Chief Complaint:  No data recorded Current Symptoms/Problems: No data recorded  Patient Reported Schizophrenia/Schizoaffective Diagnosis in Past: No   Strengths: Pt is able to ask  for help; pt has stable housing  Preferences: No data recorded Abilities: No data recorded  Type of Services Patient Feels are Needed: No data recorded  Initial Clinical Notes/Concerns: No data recorded  Mental Health Symptoms Depression:   Hopelessness; Change in energy/activity; Sleep (too much or little); Increase/decrease in appetite   Duration of Depressive symptoms:  Greater than two weeks   Mania:   Racing thoughts   Anxiety:    Worrying; Tension   Psychosis:   None   Duration of Psychotic symptoms:  Less than six months   Trauma:   Guilt/shame; Irritability/anger; Re-experience of traumatic event   Obsessions:   Recurrent & persistent thoughts/impulses/images; Cause anxiety; Disrupts routine/functioning; Intrusive/time consuming; Poor insight   Compulsions:   N/A   Inattention:   N/A   Hyperactivity/Impulsivity:   N/A   Oppositional/Defiant Behaviors:   Angry; Easily annoyed; Resentful; Spiteful; Temper   Emotional Irregularity:   Intense/inappropriate anger; Recurrent suicidal behaviors/gestures/threats; Unstable self-image   Other Mood/Personality Symptoms:  No data recorded   Mental Status Exam Appearance and self-care  Stature:   Average   Weight:  Obese   Clothing:   Casual (In hospital gown)   Grooming:   Normal   Cosmetic use:   None   Posture/gait:   Normal   Motor activity:   Not Remarkable   Sensorium  Attention:   Normal   Concentration:   Normal   Orientation:   Object; Person; Situation; Place   Recall/memory:   Normal   Affect and Mood  Affect:   Anxious   Mood:   Depressed   Relating  Eye contact:   Normal   Facial expression:   Responsive   Attitude toward examiner:   Cooperative; Manipulative   Thought and Language  Speech flow:  Clear and Coherent   Thought content:   Appropriate to Mood and Circumstances   Preoccupation:   Guilt   Hallucinations:   None   Organization:  No  data recorded  [employer] of Knowledge:   Fair   Intelligence:   Average   Abstraction:   Normal   Judgement:   Fair   Dance movement psychotherapist:   Adequate   Insight:   Lacking; Poor   Decision Making:   Vacilates   Social Functioning  Social Maturity:   Isolates   Social Judgement:   Impropriety   Stress  Stressors:   Grief/losses; Transitions; Housing   Coping Ability:   Overwhelmed   Skill Deficits:   Activities of daily living; Decision making   Supports:   Family; Friends/Service system     Religion: Religion/Spirituality Are You A Religious Person?:  (Not assessed) How Might This Affect Treatment?: n/a  Leisure/Recreation: Leisure / Recreation Do You Have Hobbies?: No  Exercise/Diet: Exercise/Diet Do You Exercise?: No Have You Gained or Lost A Significant Amount of Weight in the Past Six Months?: Yes-Gained Do You Follow a Special Diet?: No Do You Have Any Trouble Sleeping?: No   CCA Employment/Education Employment/Work Situation: Employment / Work Systems developer: On disability Why is Patient on Disability: Mental Health/ Physical Health How Long has Patient Been on Disability: Unknown Patient's Job has Been Impacted by Current Illness: No Has Patient ever Been in the [employer]?: No  Education: Education Last Grade Completed:  (N/a) Did You Attend College?: No Did You Have An Individualized Education Program (IIEP):  (not assessed) Did You Have Any Difficulty At School?:  (not assessed)   CCA Family/Childhood History Family and Relationship History: Family history Marital status: Single Does patient have children?: No  Childhood History:  Childhood History By whom was/is the patient raised?: Father Did patient suffer any verbal/emotional/physical/sexual abuse as a child?: No Did patient suffer from severe childhood neglect?: No Has patient ever been sexually abused/assaulted/raped as an adolescent or  adult?: No Was the patient ever a victim of a crime or a disaster?: No Witnessed domestic violence?: No Has patient been affected by domestic violence as an adult?: No  Child/Adolescent Assessment:     CCA Substance Use Alcohol/Drug Use: Alcohol / Drug Use Pain Medications: See MAR Prescriptions: See MAR Over the Counter: See MAR History of alcohol / drug use?: Yes Longest period of sobriety (when/how long): Unknown Negative Consequences of Use: Personal relationships Withdrawal Symptoms: None                         ASAM's:  Six Dimensions of Multidimensional Assessment  Dimension 1:  Acute Intoxication and/or Withdrawal Potential:   Dimension 1:  Description of individual's past and current experiences of substance use and  withdrawal: Pt reported that he consumes alcohol when stressed  Dimension 2:  Biomedical Conditions and Complications:   Dimension 2:  Description of patient's biomedical conditions and  complications: Pt has a heart condition  Dimension 3:  Emotional, Behavioral, or Cognitive Conditions and Complications:  Dimension 3:  Description of emotional, behavioral, or cognitive conditions and complications: Pt has an extensive mental health hx  Dimension 4:  Readiness to Change:     Dimension 5:  Relapse, Continued use, or Continued Problem Potential:     Dimension 6:  Recovery/Living Environment:     ASAM Severity Score: ASAM's Severity Rating Score: 12  ASAM Recommended Level of Treatment: ASAM Recommended Level of Treatment: Level I Outpatient Treatment   Substance use Disorder (SUD) Substance Use Disorder (SUD)  Checklist Symptoms of Substance Use: Continued use despite having a persistent/recurrent physical/psychological problem caused/exacerbated by use  Recommendations for Services/Supports/Treatments: Recommendations for Services/Supports/Treatments Recommendations For Services/Supports/Treatments: Individual Therapy  DSM5 Diagnoses: Patient  Active Problem List   Diagnosis Date Noted   Hypersomnia 11/08/2021   Acute metabolic encephalopathy 09/24/2021   Valproic acid toxicity 09/24/2021   Hyperammonemia (HCC) 09/24/2021   Generalized anxiety disorder 09/24/2021   Hyperkalemia 09/08/2021   Chronic atrial fibrillation with RVR (HCC)    Urinary obstruction 09/05/2021   Inability to urinate    Conversion disorder    Syncope and collapse 08/28/2021   History of gout 08/28/2021   Schizoaffective disorder, bipolar type (HCC) 08/14/2021   Chronic hypoxemic respiratory failure (HCC) 08/14/2021   OSA (obstructive sleep apnea)    Diabetes mellitus type 2, uncomplicated (HCC)    Left-sided weakness 08/02/2021   Obesity, Class III, BMI 40-49.9 (morbid obesity) (HCC) 08/02/2021   Chronic atrial fibrillation (HCC) 08/02/2021   Primary hypertension 08/04/2020    Patient Centered Plan: Patient is on the following Treatment Plan(s):  Depression and Impulse Control   Referrals to Alternative Service(s): Referred to Alternative Service(s):   Place:   Date:   Time:    Referred to Alternative Service(s):   Place:   Date:   Time:    Referred to Alternative Service(s):   Place:   Date:   Time:    Referred to Alternative Service(s):   Place:   Date:   Time:      @[facility]COLLABOFCARE @  [employer], LCAS-A

## 2021-11-20 ENCOUNTER — Emergency Department: Payer: Medicaid Other

## 2021-11-20 DIAGNOSIS — F411 Generalized anxiety disorder: Secondary | ICD-10-CM | POA: Diagnosis not present

## 2021-11-20 DIAGNOSIS — F250 Schizoaffective disorder, bipolar type: Secondary | ICD-10-CM

## 2021-11-20 DIAGNOSIS — R45851 Suicidal ideations: Secondary | ICD-10-CM | POA: Diagnosis not present

## 2021-11-20 MED ORDER — CEFADROXIL 500 MG PO CAPS: 1000.0000 mg | ORAL_CAPSULE | Freq: Two times a day (BID) | ORAL | 0 refills | Status: AC

## 2021-11-20 NOTE — Consult Note (Addendum)
[facility] Face-to-Face Psychiatry Consult   Reason for Consult:  Psychiatric Evaluation Referring Physician:  Dr. Erma Heritage Patient Identification: Daniel [name] MRN:  [id number] Principal Diagnosis: Generalized anxiety disorder Diagnosis:  Principal Problem:   Generalized anxiety disorder Active Problems:   Schizoaffective disorder, bipolar type (HCC)   Total Time spent with patient: 45 minutes  Subjective:   " Cant you see I dont feel good"  HPI:  Daniel [name], 31 y.o., male patient seen by this provider; chart reviewed and consulted with Dr. Erma Heritage on 11/20/21.  Per triage note Pt comes to ED via [facility] after he reportedly threatened to jump from the vehicle he was in because the care giver would not bring him to the ED immediately. Reported to EMS that he was suicidal due to his caregivers. On arrival pt is ambulatory in no distress. States that he has had back pain and burning with urination that he has had for "some time." Pt states that he also is SI with no plan due to caregivers restraining him when he tries to leave ALF home. Pt is asked by this nurse if he is informing them of where he is going, states no and that he is old enough to not tell anyone, seems to imply that he threatens to run away from home when this occurs. States he has back pain and burning with urination. Pt is taking part in manipulative behaviors to get things he wants and is having attention seeking behaviors on arrival.   Per TTS, during assessment patient appears alert and oriented x4, calm and cooperative. Patient reports "I tried to jump out of a moving vehicle because my caregivers are not being good hostesses, I'm cooped up in that house and I need a higher level of care but my legal guardian wants me there, I belong in a nursing facility." "Today was just a bad day." Patient reports "my caregiver wasn't going to bring me to the hospital so I told him that I was going to jump out the vehicle." Patient reports  continued SI "because the stress is like a tidal wave it's going to consume me."   Patient is psychiatrically cleared. He frequents the er in effort to gain admission because he doesn't like his current living situation. At this time, patient presents no foreseeable risk of suicide. Not to mention, patient resides in a group home and is consistently monitored.   Recommendation:  Discharge in the AM or when feasibly appropriate if patient remains psychiatrically stable.  Past Psychiatric History: Schizoaffective disorder  Risk to Self:   Risk to Others:   Prior Inpatient Therapy:   Prior Outpatient Therapy:    Past Medical History:  Past Medical History:  Diagnosis Date   Atrial fibrillation (HCC)    Cardiomyopathy (HCC)    a. 07/2021 Echo: EF 50%, mild LVH, nl RV size/function. No significant valvular disease.   Chest pain    CHF (congestive heart failure) (HCC)    Conversion disorder    Current use of long term anticoagulation    Diabetes mellitus without complication (HCC)    Hyperlipidemia    Hypertension    OSA (obstructive sleep apnea)    Persistent atrial fibrillation and flutter (HCC)    a. CHA2DS2VASc = 3-4   Schizoaffective disorder (HCC)    Seizure disorder (HCC)    TIA (transient ischemic attack)    Urethral stricture    a. 08/2021 s/p cystoscopy and urethral dilation.    Past Surgical History:  Procedure Laterality Date  CYSTOSCOPY WITH URETHRAL DILATATION N/A 09/07/2021   Procedure: CYSTOSCOPY WITH URETHRAL DILATATION CATHETER PLACEMENT;  Surgeon: Riki Altes, MD;  Location: [facility] ORS;  Service: Urology;  Laterality: N/A;   Family History: No family history on file. Family Psychiatric  History: unknown Social History:  Social History   Substance and Sexual Activity  Alcohol Use Not Currently     Social History   Substance and Sexual Activity  Drug Use Never    Social History   Socioeconomic History   Marital status: Single    Spouse name: Not on  file   Number of children: Not on file   Years of education: Not on file   Highest education level: Not on file  Occupational History   Not on file  Tobacco Use   Smoking status: Former    Types: Cigarettes   Smokeless tobacco: Never  Vaping Use   Vaping Use: Never used  Substance and Sexual Activity   Alcohol use: Not Currently   Drug use: Never   Sexual activity: Not on file  Other Topics Concern   Not on file  Social History Narrative   Now living in a group home in [place].   Social Determinants of Health   Financial Resource Strain: Not on file  Food Insecurity: Not on file  Transportation Needs: Not on file  Physical Activity: Not on file  Stress: Not on file  Social Connections: Not on file   Additional Social History:    Allergies:   Allergies  Allergen Reactions   Haldol [Haloperidol] Other (See Comments)    SI   Abilify [Aripiprazole] Palpitations   Demerol [Meperidine Hcl] Hives    Labs:  Results for orders placed or performed during the hospital encounter of 11/19/21 (from the past 48 hour(s))  Urine Drug Screen, Qualitative     Status: Abnormal   Collection Time: 11/19/21  7:51 PM  Result Value Ref Range   Tricyclic, Ur Screen POSITIVE (A) NONE DETECTED   Amphetamines, Ur Screen NONE DETECTED NONE DETECTED   MDMA (Ecstasy)Ur Screen NONE DETECTED NONE DETECTED   Cocaine Metabolite,Ur [place] NONE DETECTED NONE DETECTED   Opiate, Ur Screen NONE DETECTED NONE DETECTED   Phencyclidine (PCP) Ur S NONE DETECTED NONE DETECTED   Cannabinoid 50 Ng, Ur [place] NONE DETECTED NONE DETECTED   Barbiturates, Ur Screen NONE DETECTED NONE DETECTED   Benzodiazepine, Ur Scrn NONE DETECTED NONE DETECTED   Methadone Scn, Ur NONE DETECTED NONE DETECTED    Comment: (NOTE) Tricyclics + metabolites, urine    Cutoff 1000 ng/mL Amphetamines + metabolites, urine  Cutoff 1000 ng/mL MDMA (Ecstasy), urine              Cutoff 500 ng/mL Cocaine Metabolite, urine          Cutoff 300  ng/mL Opiate + metabolites, urine        Cutoff 300 ng/mL Phencyclidine (PCP), urine         Cutoff 25 ng/mL Cannabinoid, urine                 Cutoff 50 ng/mL Barbiturates + metabolites, urine  Cutoff 200 ng/mL Benzodiazepine, urine              Cutoff 200 ng/mL Methadone, urine                   Cutoff 300 ng/mL  The urine drug screen provides only a preliminary, unconfirmed analytical test result and should not be used for non-medical  purposes. Clinical consideration and professional judgment should be applied to any positive drug screen result due to possible interfering substances. A more specific alternate chemical method must be used in order to obtain a confirmed analytical result. Gas chromatography / mass spectrometry (GC/MS) is the preferred confirm atory method. Performed at [facility], [address] [place], [place] [address]   Urinalysis, Routine w reflex microscopic     Status: Abnormal   Collection Time: 11/19/21  7:51 PM  Result Value Ref Range   Color, Urine YELLOW (A) YELLOW   APPearance HAZY (A) CLEAR   Specific Gravity, Urine 1.032 (H) 1.005 - 1.030   pH 5.0 5.0 - 8.0   Glucose, UA >=500 (A) NEGATIVE mg/dL   Hgb urine dipstick NEGATIVE NEGATIVE   Bilirubin Urine NEGATIVE NEGATIVE   Ketones, ur 5 (A) NEGATIVE mg/dL   Protein, ur 30 (A) NEGATIVE mg/dL   Nitrite NEGATIVE NEGATIVE   Leukocytes,Ua TRACE (A) NEGATIVE   RBC / HPF 6-10 0 - 5 RBC/hpf   WBC, UA 21-50 0 - 5 WBC/hpf   Bacteria, UA RARE (A) NONE SEEN   Squamous Epithelial / LPF 6-10 0 - 5   Mucus PRESENT    Hyaline Casts, UA PRESENT     Comment: Performed at [facility], [address] [place], [place] [address]  Comprehensive metabolic panel     Status: Abnormal   Collection Time: 11/19/21  8:06 PM  Result Value Ref Range   Sodium 137 135 - 145 mmol/L   Potassium 3.9 3.5 - 5.1 mmol/L   Chloride 99 98 - 111 mmol/L   CO2 28 22 - 32 mmol/L   Glucose, Bld 122 (H) 70 - 99  mg/dL    Comment: Glucose reference range applies only to samples taken after fasting for at least 8 hours.   BUN 26 (H) 6 - 20 mg/dL   Creatinine, Ser 0.10 0.61 - 1.24 mg/dL   Calcium 9.2 8.9 - 27.2 mg/dL   Total Protein 6.8 6.5 - 8.1 g/dL   Albumin 3.7 3.5 - 5.0 g/dL   AST 26 15 - 41 U/L   ALT 19 0 - 44 U/L   Alkaline Phosphatase 63 38 - 126 U/L   Total Bilirubin 0.4 0.3 - 1.2 mg/dL   GFR, Estimated >53 >66 mL/min    Comment: (NOTE) Calculated using the CKD-EPI Creatinine Equation (2021)    Anion gap 10 5 - 15    Comment: Performed at [facility], [address], [place], [place] [address]  Ethanol     Status: None   Collection Time: 11/19/21  8:06 PM  Result Value Ref Range   Alcohol, Ethyl (B) <10 <10 mg/dL    Comment: (NOTE) Lowest detectable limit for serum alcohol is 10 mg/dL.  For medical purposes only. Performed at [facility], [address] [place], [place] [address]   Salicylate level     Status: Abnormal   Collection Time: 11/19/21  8:06 PM  Result Value Ref Range   Salicylate Lvl <7.0 (L) 7.0 - 30.0 mg/dL    Comment: Performed at [facility], [address] [place], [place] [address]  Acetaminophen level     Status: Abnormal   Collection Time: 11/19/21  8:06 PM  Result Value Ref Range   Acetaminophen (Tylenol), Serum <10 (L) 10 - 30 ug/mL    Comment: (NOTE) Therapeutic concentrations vary significantly. A range of 10-30 ug/mL  may be an effective concentration for many  patients. However, some  are best treated at concentrations outside of this range. Acetaminophen concentrations >150 ug/mL at 4 hours after ingestion  and >50 ug/mL at 12 hours after ingestion are often associated with  toxic reactions.  Performed at [facility], [address] [place], [place] [address]   cbc     Status: Abnormal   Collection Time: 11/19/21  8:06 PM  Result Value Ref Range   WBC 7.2 4.0 - 10.5 K/uL   RBC 4.30 4.22 - 5.81  MIL/uL   Hemoglobin 12.5 (L) 13.0 - 17.0 g/dL   HCT 60.4 (L) 54.0 - 98.1 %   MCV 89.5 80.0 - 100.0 fL   MCH 29.1 26.0 - 34.0 pg   MCHC 32.5 30.0 - 36.0 g/dL   RDW 19.1 47.8 - 29.5 %   Platelets 184 150 - 400 K/uL   nRBC 0.0 0.0 - 0.2 %    Comment: Performed at [facility], [address] [place], [place] [address]  Brain natriuretic peptide     Status: Abnormal   Collection Time: 11/19/21  8:06 PM  Result Value Ref Range   B Natriuretic Peptide 108.7 (H) 0.0 - 100.0 pg/mL    Comment: Performed at [facility], [address] [place], [place] [address]  Troponin I (High Sensitivity)     Status: None   Collection Time: 11/19/21  8:06 PM  Result Value Ref Range   Troponin I (High Sensitivity) 3 <18 ng/L    Comment: (NOTE) Elevated high sensitivity troponin I (hsTnI) values and significant  changes across serial measurements may suggest ACS but many other  chronic and acute conditions are known to elevate hsTnI results.  Refer to the "Links" section for chest pain algorithms and additional  guidance. Performed at [facility], [address] [place], [place] [address]   Troponin I (High Sensitivity)     Status: None   Collection Time: 11/19/21 10:35 PM  Result Value Ref Range   Troponin I (High Sensitivity) 4 <18 ng/L    Comment: (NOTE) Elevated high sensitivity troponin I (hsTnI) values and significant  changes across serial measurements may suggest ACS but many other  chronic and acute conditions are known to elevate hsTnI results.  Refer to the "Links" section for chest pain algorithms and additional  guidance. Performed at [facility], [address] [place], [place] [address]     No current facility-administered medications for this encounter.   Current Outpatient Medications  Medication Sig Dispense Refill   albuterol (VENTOLIN HFA) 108 (90 Base) MCG/ACT inhaler Inhale 2 puffs into the lungs every 6 (six) hours as needed  for wheezing. (Patient not taking: Reported on 11/09/2021)     allopurinol (ZYLOPRIM) 300 MG tablet Take 1 tablet (300 mg total) by mouth daily. (Patient not taking: Reported on 11/09/2021) 30 tablet 0   aspirin 325 MG tablet Take 325 mg by mouth daily. (Patient not taking: Reported on 11/04/2021)     atorvastatin (LIPITOR) 80 MG tablet Take 80 mg by mouth daily. (Patient not taking: Reported on 11/04/2021)     baclofen (LIORESAL) 10 MG tablet Take 1 tablet (10 mg total) by mouth 2 (two) times daily as needed for muscle spasms. Home med. 30 each 0   cyclobenzaprine (FLEXERIL) 5 MG tablet Take 5 mg by mouth 3 (three) times daily as needed for muscle spasms.     dapagliflozin propanediol (FARXIGA) 10 MG TABS tablet Take 1 tablet (10 mg total) by  mouth daily before breakfast. 90 tablet 3   diltiazem (CARDIZEM CD) 360 MG 24 hr capsule Take 1 capsule (360 mg total) by mouth daily. 30 capsule 1   divalproex (DEPAKOTE) 500 MG DR tablet Take 2 tablets (1,000 mg total) by mouth 2 (two) times daily.     ELIQUIS 5 MG TABS tablet Take 5 mg by mouth 2 (two) times daily.     escitalopram (LEXAPRO) 10 MG tablet Take 20 mg by mouth daily.     fluticasone (FLONASE) 50 MCG/ACT nasal spray Place 2 sprays into both nostrils 2 (two) times daily.     furosemide (LASIX) 20 MG tablet Take 20 mg by mouth daily.     HYDROcodone-acetaminophen (NORCO/VICODIN) 5-325 MG tablet Take 1 tablet by mouth every 4 (four) hours as needed. (Patient not taking: Reported on 11/04/2021) 15 tablet 0   hydrOXYzine (ATARAX) 25 MG tablet Take 25 mg by mouth daily. At noon     hydrOXYzine (ATARAX) 50 MG tablet Take 50 mg by mouth in the morning and at bedtime.     ibuprofen (ADVIL) 800 MG tablet Take 800 mg by mouth every 8 (eight) hours as needed. (Patient not taking: Reported on 11/04/2021)     INVEGA 9 MG 24 hr tablet Take 9 mg by mouth every morning.     loratadine (CLARITIN) 10 MG tablet Take 10 mg by mouth daily.     melatonin 3 MG TABS tablet  Take 3 mg by mouth at bedtime.     metoprolol tartrate (LOPRESSOR) 50 MG tablet Take 50 mg by mouth 2 (two) times daily.     naproxen (NAPROSYN) 500 MG tablet Take 1 tablet (500 mg total) by mouth 2 (two) times daily with a meal. 20 tablet 2   omeprazole (PRILOSEC) 20 MG capsule Take 20 mg by mouth daily.     potassium chloride SA (KLOR-CON M) 20 MEQ tablet Take 1 tablet (20 mEq total) by mouth daily for 7 days. 7 tablet 0   promethazine (PHENERGAN) 25 MG tablet Take 25 mg by mouth every 6 (six) hours as needed for nausea or vomiting.     sacubitril-valsartan (ENTRESTO) 24-26 MG Take 1 tablet by mouth 2 (two) times daily. 60 tablet 3   tamsulosin (FLOMAX) 0.4 MG CAPS capsule Take 1 capsule (0.4 mg total) by mouth daily. 30 capsule 2   traMADol (ULTRAM) 50 MG tablet Take 50 mg by mouth every 6 (six) hours as needed. (Patient not taking: Reported on 11/04/2021)      Musculoskeletal: Strength & Muscle Tone: within normal limits Gait & Station: unable to stand Patient leans: N/A            Psychiatric Specialty Exam:  Presentation  General Appearance: Appropriate for Environment  Eye Contact:Fair  Speech:Clear and Coherent  Speech Volume:Normal  Handedness:Right   Mood and Affect  Mood:Anxious; Dysphoric  Affect:Appropriate   Thought Process  Thought Processes:Coherent  Descriptions of Associations:Intact  Orientation:Full (Time, Place and Person)  Thought Content:Logical; WDL  History of Schizophrenia/Schizoaffective disorder:No  Duration of Psychotic Symptoms:Less than six months  Hallucinations:No data recorded Ideas of Reference:None  Suicidal Thoughts:Suicidal Thoughts: Yes, Passive SI Passive Intent and/or Plan: Without Intent  Homicidal Thoughts:Homicidal Thoughts: No   Sensorium  Memory:Immediate Fair  Judgment:Fair  Insight:Poor   Executive Functions  Concentration:Fair  Attention Span:Fair  Recall:Fair  Fund of  Knowledge:Fair  Language:Fair   Psychomotor Activity  Psychomotor Activity:Psychomotor Activity: Normal   Assets  Assets:Communication Skills; Desire for Improvement; Financial  Resources/Insurance; Housing; Social Support   Sleep  Sleep:Sleep: Fair   Physical Exam: Physical Exam ROS Blood pressure 130/79, pulse 97, temperature 98.5 F (36.9 C), temperature source Oral, resp. rate 15, height 6\' 5"  (1.956 m), weight (!) 170.1 kg, SpO2 96 %. Body mass index is 44.47 kg/m.  Disposition: No evidence of imminent risk to self or others at present.   Patient does not meet criteria for psychiatric inpatient admission. Supportive therapy provided about ongoing stressors. Discussed crisis plan, support from social network, calling 911, coming to the Emergency Department, and calling Suicide Hotline.  , NP 11/20/2021 3:45 AM

## 2021-11-20 NOTE — ED Notes (Signed)
Pt's caregiver OJ Manson Passey here to pick up this pt , ERMD at bedside with this Pt . Denies SI/HI this time with MD

## 2021-11-22 ENCOUNTER — Emergency Department
Admission: EM | Admit: 2021-11-22 | Discharge: 2021-11-22 | Disposition: A | Discharge status: Home / Self Care | Payer: Medicaid Other | Attending: Emergency Medicine | Admitting: Emergency Medicine

## 2021-11-22 ENCOUNTER — Other Ambulatory Visit: Payer: Self-pay

## 2021-11-22 ENCOUNTER — Encounter: Payer: Self-pay | Admitting: Medical Oncology

## 2021-11-22 ENCOUNTER — Emergency Department: Payer: Medicaid Other

## 2021-11-22 ENCOUNTER — Emergency Department
Admission: EM | Admit: 2021-11-22 | Discharge: 2021-11-22 | Disposition: A | Discharge status: Home / Self Care | Payer: Medicaid Other | Source: Home / Self Care | Attending: Emergency Medicine | Admitting: Emergency Medicine

## 2021-11-22 DIAGNOSIS — M545 Low back pain, unspecified: Secondary | ICD-10-CM | POA: Diagnosis present

## 2021-11-22 DIAGNOSIS — I509 Heart failure, unspecified: Secondary | ICD-10-CM | POA: Insufficient documentation

## 2021-11-22 DIAGNOSIS — Z7901 Long term (current) use of anticoagulants: Secondary | ICD-10-CM | POA: Insufficient documentation

## 2021-11-22 DIAGNOSIS — I4891 Unspecified atrial fibrillation: Secondary | ICD-10-CM | POA: Insufficient documentation

## 2021-11-22 DIAGNOSIS — I110 Hypertensive heart disease with heart failure: Secondary | ICD-10-CM | POA: Insufficient documentation

## 2021-11-22 DIAGNOSIS — E119 Type 2 diabetes mellitus without complications: Secondary | ICD-10-CM | POA: Insufficient documentation

## 2021-11-22 DIAGNOSIS — Y9339 Activity, other involving climbing, rappelling and jumping off: Secondary | ICD-10-CM | POA: Insufficient documentation

## 2021-11-22 DIAGNOSIS — R300 Dysuria: Secondary | ICD-10-CM | POA: Insufficient documentation

## 2021-11-22 DIAGNOSIS — W1839XA Other fall on same level, initial encounter: Secondary | ICD-10-CM | POA: Diagnosis not present

## 2021-11-22 DIAGNOSIS — R079 Chest pain, unspecified: Secondary | ICD-10-CM

## 2021-11-22 LAB — URINALYSIS, COMPLETE (UACMP) WITH MICROSCOPIC
Glucose, UA: >=500 mg/dL — AB
Hgb urine dipstick: NEGATIVE
Ketones, ur: 5 mg/dL — AB
Nitrite: NEGATIVE
Protein, ur: 100 mg/dL — AB
Specific Gravity, Urine: 1.038 — ABNORMAL HIGH (ref 1.005–1.030)
pH: 5 (ref 5.0–8.0)

## 2021-11-22 LAB — CBC
HCT: 42.9 % (ref 39.0–52.0)
Hemoglobin: 14 g/dL (ref 13.0–17.0)
MCH: 29.1 pg (ref 26.0–34.0)
MCHC: 32.6 g/dL (ref 30.0–36.0)
MCV: 89.2 fL (ref 80.0–100.0)
Platelets: 189 10*3/uL (ref 150–400)
RBC: 4.81 MIL/uL (ref 4.22–5.81)
RDW: 14.6 % (ref 11.5–15.5)
WBC: 7.8 10*3/uL (ref 4.0–10.5)
nRBC: 0.3 % — ABNORMAL HIGH (ref 0.0–0.2)

## 2021-11-22 LAB — CHLAMYDIA/NGC RT PCR (ARMC ONLY)
Chlamydia Tr: NOT DETECTED
N gonorrhoeae: NOT DETECTED

## 2021-11-22 LAB — BASIC METABOLIC PANEL WITH GFR
Anion gap: 10 (ref 5–15)
BUN: 24 mg/dL — ABNORMAL HIGH (ref 6–20)
CO2: 27 mmol/L (ref 22–32)
Calcium: 9.3 mg/dL (ref 8.9–10.3)
Chloride: 96 mmol/L — ABNORMAL LOW (ref 98–111)
Creatinine, Ser: 1.3 mg/dL — ABNORMAL HIGH (ref 0.61–1.24)
GFR, Estimated: >60 mL/min
Glucose, Bld: 125 mg/dL — ABNORMAL HIGH (ref 70–99)
Potassium: 3.8 mmol/L (ref 3.5–5.1)
Sodium: 133 mmol/L — ABNORMAL LOW (ref 135–145)

## 2021-11-22 LAB — URINE CULTURE

## 2021-11-22 LAB — TSH: TSH: 4.355 u[IU]/mL (ref 0.350–4.500)

## 2021-11-22 LAB — TROPONIN I (HIGH SENSITIVITY)
Troponin I (High Sensitivity): 3 ng/L (ref <18)
Troponin I (High Sensitivity): 3 ng/L (ref <18)

## 2021-11-22 LAB — MAGNESIUM: Magnesium: 2.1 mg/dL (ref 1.7–2.4)

## 2021-11-22 MED ORDER — METOPROLOL TARTRATE 25 MG PO TABS
50.0000 mg | ORAL_TABLET | ORAL | Status: AC
  Administered 2021-11-22: 50 mg via ORAL
  Filled 2021-11-22: qty 2

## 2021-11-22 MED ORDER — MORPHINE SULFATE (PF) 4 MG/ML IV SOLN
4.0000 mg | Freq: Once | INTRAVENOUS | Status: AC
  Administered 2021-11-22: 4 mg via INTRAMUSCULAR
  Filled 2021-11-22: qty 1

## 2021-11-22 MED ORDER — KETOROLAC TROMETHAMINE 60 MG/2ML IM SOLN
60.0000 mg | Freq: Once | INTRAMUSCULAR | Status: AC
  Administered 2021-11-22: 60 mg via INTRAMUSCULAR
  Filled 2021-11-22: qty 2

## 2021-11-22 MED ORDER — LACTATED RINGERS IV BOLUS
500.0000 mL | Freq: Once | INTRAVENOUS | Status: AC
  Administered 2021-11-22: 500 mL via INTRAVENOUS

## 2021-11-22 NOTE — ED Provider Notes (Signed)
[facility] Provider Note   Event Date/Time   First MD Initiated Contact with Patient 11/22/21 1353     (approximate) History  Back Pain  HPI Daniel [name] is a 31 y.o. male  Location: Low back Duration: Just prior to arrival Timing: Stable since onset Severity: 10/10 Quality: Pain Context: Patient states that he was trying to escape from his facility by jumping out of a 3 foot high window and fell down onto his buttocks and since that time has had significant bilateral lower lumbar back pain Modifying factors: Any movement worsens this pain and is partially relieved at rest Associated Symptoms: Denies ROS: Patient currently denies any vision changes, tinnitus, difficulty speaking, facial droop, sore throat, chest pain, shortness of breath, abdominal pain, nausea/vomiting/diarrhea, dysuria, or weakness/numbness/paresthesias in any extremity   Physical Exam  Triage Vital Signs: ED Triage Vitals  Enc Vitals Group     BP 11/22/21 1245 103/89     Pulse Rate 11/22/21 1243 62     Resp 11/22/21 1243 18     Temp 11/22/21 1243 98.7 F (37.1 C)     Temp Source 11/22/21 1243 Oral     SpO2 11/22/21 1243 94 %     Weight 11/22/21 1244 (!) 374 lb 12.5 oz (170 kg)     Height 11/22/21 1244 6\' 5"  (1.956 m)     Head Circumference --      Peak Flow --      Pain Score 11/22/21 1244 10     Pain Loc --      Pain Edu? --      Excl. in GC? --    Most recent vital signs: Vitals:   11/22/21 1243 11/22/21 1245  BP:  103/89  Pulse: 62   Resp: 18   Temp: 98.7 F (37.1 C)   SpO2: 94%    General: Awake, oriented x4. CV:  Good peripheral perfusion.  Resp:  Normal effort.  Abd:  No distention.  Other:  Middle-aged morbidly obese Caucasian male laying in bed in no acute distress ED Results / Procedures / Treatments  RADIOLOGY ED MD interpretation: X-ray of the lumbar spine interpreted by me and shows no acute abnormalities.  Incidentally found asymmetric left-sided  degenerative changes at L5/S1 as well as straightening of the normal lumbar lordosis -Agree with radiology assessment Official radiology report(s): DG Lumbar Spine Complete  Result Date: 11/22/2021 CLINICAL DATA:  Fall jumping out of window.  Low back pain. EXAM: LUMBAR SPINE - COMPLETE 4+ VIEW COMPARISON:  CT of the lumbar spine 09/16/2021 FINDINGS: Five non rib-bearing lumbar type vertebral bodies are present. Vertebral body heights and alignment are maintained. Straightening of the normal lumbar lordosis is present. Asymmetric left-sided degenerative changes are present at L5-S1. No acute abnormality is present. IMPRESSION: 1. No acute abnormality. 2. Asymmetric left-sided degenerative changes at L5-S1. 3. Straightening of the normal lumbar lordosis. Electronically Signed   By: Marin Roberts M.D.   On: 11/22/2021 14:08   PROCEDURES: Critical Care performed: No Procedures MEDICATIONS ORDERED IN ED: Medications  morphine (PF) 4 MG/ML injection 4 mg (has no administration in time range)  ketorolac (TORADOL) injection 60 mg (has no administration in time range)   IMPRESSION / MDM / ASSESSMENT AND PLAN / ED COURSE  I reviewed the triage vital signs and the nursing notes.  The patient is on the cardiac monitor to evaluate for evidence of arrhythmia and/or significant heart rate changes. Patient's presentation is most consistent with acute presentation with potential threat to life or bodily function. Patient presents for low back pain. Given History and Exam the patient appears to be at low risk for Spinal Cord Compression Syndrome, Vertebral Malignancy/Mets, acute Spinal Fracture, Vertebral Osteomyelitis, Epidural Abscess, Infected or Obstructing Kidney Stone.  Their presentation appears most likely to be secondary to non-emergent musculoskeletal etiology vs non-emergent disc herniation.  ED Workup: X-ray of the lumbar spine did not show any evidence of acute  abnormalities  Disposition: Discharge. Strict return precautions discussed with patient with full understanding. Advised patient to follow up promptly with primary care provider   FINAL CLINICAL IMPRESSION(S) / ED DIAGNOSES   Final diagnoses:  Acute bilateral low back pain without sciatica   Rx / DC Orders   ED Discharge Orders          Ordered    Ambulatory referral to Social Work        11/22/21 1539           Note:  This document was prepared using Dragon voice recognition software and may include unintentional dictation errors.   Merwyn Katos, MD 11/22/21 [phone]

## 2021-11-22 NOTE — ED Notes (Signed)
Pt provided with sandwich, soda, and chocolate milk.

## 2021-11-22 NOTE — ED Triage Notes (Signed)
Pt to ED via [facility] with reports that he was trying to run away from his facility by jumping out of 3 ft tall window, pt now complains of lower back pain.

## 2021-11-24 ENCOUNTER — Ambulatory Visit: Payer: Medicaid Other | Admitting: Family

## 2021-11-24 LAB — URINE CULTURE

## 2021-11-28 ENCOUNTER — Encounter: Payer: Self-pay | Admitting: Emergency Medicine

## 2021-11-28 ENCOUNTER — Emergency Department: Payer: Medicaid Other

## 2021-11-28 ENCOUNTER — Other Ambulatory Visit: Payer: Self-pay

## 2021-11-28 ENCOUNTER — Emergency Department
Admission: EM | Admit: 2021-11-28 | Discharge: 2021-11-28 | Disposition: A | Discharge status: Nursing Home (Includes ICF) | Payer: Medicaid Other | Attending: Emergency Medicine | Admitting: Emergency Medicine

## 2021-11-28 DIAGNOSIS — R300 Dysuria: Secondary | ICD-10-CM | POA: Diagnosis not present

## 2021-11-28 DIAGNOSIS — E119 Type 2 diabetes mellitus without complications: Secondary | ICD-10-CM | POA: Diagnosis not present

## 2021-11-28 DIAGNOSIS — I509 Heart failure, unspecified: Secondary | ICD-10-CM | POA: Insufficient documentation

## 2021-11-28 DIAGNOSIS — R7989 Other specified abnormal findings of blood chemistry: Secondary | ICD-10-CM | POA: Insufficient documentation

## 2021-11-28 DIAGNOSIS — M7989 Other specified soft tissue disorders: Secondary | ICD-10-CM | POA: Diagnosis present

## 2021-11-28 DIAGNOSIS — R609 Edema, unspecified: Secondary | ICD-10-CM

## 2021-11-28 DIAGNOSIS — I110 Hypertensive heart disease with heart failure: Secondary | ICD-10-CM | POA: Insufficient documentation

## 2021-11-28 LAB — CBC
HCT: 40.3 % (ref 39.0–52.0)
Hemoglobin: 12.8 g/dL — ABNORMAL LOW (ref 13.0–17.0)
MCH: 29.6 pg (ref 26.0–34.0)
MCHC: 31.8 g/dL (ref 30.0–36.0)
MCV: 93.3 fL (ref 80.0–100.0)
Platelets: 222 10*3/uL (ref 150–400)
RBC: 4.32 MIL/uL (ref 4.22–5.81)
RDW: 15 % (ref 11.5–15.5)
WBC: 6.2 10*3/uL (ref 4.0–10.5)
nRBC: 0.3 % — ABNORMAL HIGH (ref 0.0–0.2)

## 2021-11-28 LAB — BRAIN NATRIURETIC PEPTIDE: B Natriuretic Peptide: 261.4 pg/mL — ABNORMAL HIGH (ref 0.0–100.0)

## 2021-11-28 LAB — URINALYSIS, COMPLETE (UACMP) WITH MICROSCOPIC
Bacteria, UA: NONE SEEN
Bilirubin Urine: NEGATIVE
Glucose, UA: NEGATIVE mg/dL
Hgb urine dipstick: NEGATIVE
Ketones, ur: NEGATIVE mg/dL
Leukocytes,Ua: NEGATIVE
Nitrite: NEGATIVE
Protein, ur: NEGATIVE mg/dL
Specific Gravity, Urine: 1.018 (ref 1.005–1.030)
pH: 6 (ref 5.0–8.0)

## 2021-11-28 LAB — BASIC METABOLIC PANEL
Anion gap: 9 (ref 5–15)
BUN: 18 mg/dL (ref 6–20)
CO2: 31 mmol/L (ref 22–32)
Calcium: 9.2 mg/dL (ref 8.9–10.3)
Chloride: 99 mmol/L (ref 98–111)
Creatinine, Ser: 0.97 mg/dL (ref 0.61–1.24)
GFR, Estimated: >60 mL/min (ref >60)
Glucose, Bld: 137 mg/dL — ABNORMAL HIGH (ref 70–99)
Potassium: 4.5 mmol/L (ref 3.5–5.1)
Sodium: 139 mmol/L (ref 135–145)

## 2021-11-28 LAB — TROPONIN I (HIGH SENSITIVITY): Troponin I (High Sensitivity): 2 ng/L (ref <18)

## 2021-11-28 MED ORDER — ACETAMINOPHEN 500 MG PO TABS
ORAL_TABLET | ORAL | Status: AC
  Administered 2021-11-28: 1000 mg via ORAL
  Filled 2021-11-28: qty 2

## 2021-11-28 MED ORDER — FUROSEMIDE 10 MG/ML IJ SOLN
60.0000 mg | Freq: Once | INTRAMUSCULAR | Status: AC
  Administered 2021-11-28: 60 mg via INTRAVENOUS
  Filled 2021-11-28: qty 8

## 2021-11-28 MED ORDER — ACETAMINOPHEN 500 MG PO TABS
1000.0000 mg | ORAL_TABLET | Freq: Once | ORAL | Status: AC
  Filled 2021-11-28: qty 2

## 2021-11-28 NOTE — ED Notes (Signed)
Caregiver OJ notified of pending DC. Pt LG called and Voicemail left. Pt dc ppw provided.

## 2021-11-28 NOTE — ED Provider Notes (Signed)
[facility] Provider Note    Event Date/Time   First MD Initiated Contact with Patient 11/28/21 1659     (approximate)  History   Chief Complaint: Leg Swelling  HPI  Daniel [name] is a 31 y.o. male with a past medical history of CHF, diabetes, hypertension, hyperlipidemia, schizoaffective disorder who presents to the emergency department complaining of swelling in his legs and cough with shortness of breath.  According to the patient over the past few days he has been feeling like he is retaining more fluid and has noted some increased swelling and has been coughing at times.  Patient states when this happens he usually needs IV Lasix.  Patient has been seen in the emergency department many times for the same, 36 visits over the past 6 months in total.  Patient denies any chest pain.  As a secondary complaint he states he has been experiencing some painful urination at times and was told that he had a urinary tract infection but he finished his course of antibiotics and still has symptoms.  Does not believe he has had a fever.  Physical Exam   Triage Vital Signs: ED Triage Vitals  Enc Vitals Group     BP 11/28/21 1527 125/87     Pulse Rate 11/28/21 1527 97     Resp 11/28/21 1527 20     Temp 11/28/21 1527 98.1 F (36.7 C)     Temp Source 11/28/21 1527 Oral     SpO2 11/28/21 1527 94 %     Weight 11/28/21 1458 (!) 374 lb 12.5 oz (170 kg)     Height 11/28/21 1458 6\' 5"  (1.956 m)     Head Circumference --      Peak Flow --      Pain Score 11/28/21 1458 0     Pain Loc --      Pain Edu? --      Excl. in GC? --     Most recent vital signs: Vitals:   11/28/21 1527  BP: 125/87  Pulse: 97  Resp: 20  Temp: 98.1 F (36.7 C)  SpO2: 94%    General: Awake, no distress.  CV:  Good peripheral perfusion.  Regular rate and rhythm  Resp:  Normal effort.  Equal breath sounds bilaterally.  Abd:  No distention.  Soft, nontender.  No rebound or  guarding. Other:  1+ lower extremity IMA equal bilaterally.   ED Results / Procedures / Treatments   EKG  EKG viewed and interpreted by myself shows atrial fibrillation at 97 bpm with a narrow QRS, normal axis, normal intervals, nonspecific but no concerning ST changes.  History of atrial fibrillation previously.  Unchanged from prior EKG 11/04/2021.  RADIOLOGY  I have viewed and interpreted the patient's chest x-ray images.  I do not see any obvious pulmonary edema on my evaluation. Radiology is read the chest x-ray is negative.   MEDICATIONS ORDERED IN ED: Medications  furosemide (LASIX) injection 60 mg (has no administration in time range)     IMPRESSION / MDM / ASSESSMENT AND PLAN / ED COURSE  I reviewed the triage vital signs and the nursing notes.  Patient's presentation is most consistent with acute presentation with potential threat to life or bodily function.  Patient presents to the emergency department for swelling of his lower extremities as well as shortness of breath.  Patient is concerned he could be accumulating/retaining fluid.  Patient's labs have resulted showing a normal troponin, reassuring CBC,  reassuring chemistry.  Patient's BNP is slightly elevated at 261.  Reassuringly patient's chest x-ray is clear with no pulmonary edema.  However given the patient's elevated BNP believe it would be reasonable to dose 60 mg of IV Lasix in the emergency department.  Given the patient's urinary complaints we will also check a urinalysis and send a urine culture as a precaution.  Patient agreeable to plan of care.  Urinalysis is normal.  Patient has put out a significant mount of urine in the emergency department.  We will discharge with PCP follow-up.  Patient agreeable to plan.  FINAL CLINICAL IMPRESSION(S) / ED DIAGNOSES   Peripheral edema CHF Dysuria   Note:  This document was prepared using Dragon voice recognition software and may include unintentional dictation  errors.   Minna Antis, MD 11/28/21 1911

## 2021-11-28 NOTE — ED Notes (Signed)
Pt stating he feels like he may pass out, this RN instructed patient to stay in bed.

## 2021-11-28 NOTE — ED Triage Notes (Signed)
Pt reports some swelling in his leg and cough, congestion and SOB. Pt states that he has CHF and when this happens he needs a EKG and some lasix.

## 2021-11-30 LAB — URINE CULTURE: Culture: NO GROWTH

## 2021-12-03 ENCOUNTER — Ambulatory Visit: Payer: Medicaid Other | Admitting: Family

## 2021-12-04 ENCOUNTER — Emergency Department: Payer: Medicaid Other

## 2021-12-04 ENCOUNTER — Other Ambulatory Visit: Payer: Self-pay

## 2021-12-04 ENCOUNTER — Emergency Department
Admission: EM | Admit: 2021-12-04 | Discharge: 2021-12-05 | Disposition: A | Discharge status: Home / Self Care | Payer: Medicaid Other | Attending: Emergency Medicine | Admitting: Emergency Medicine

## 2021-12-04 DIAGNOSIS — R531 Weakness: Secondary | ICD-10-CM | POA: Diagnosis not present

## 2021-12-04 DIAGNOSIS — R0602 Shortness of breath: Secondary | ICD-10-CM | POA: Insufficient documentation

## 2021-12-04 DIAGNOSIS — I509 Heart failure, unspecified: Secondary | ICD-10-CM | POA: Diagnosis not present

## 2021-12-04 DIAGNOSIS — I4819 Other persistent atrial fibrillation: Secondary | ICD-10-CM | POA: Insufficient documentation

## 2021-12-04 DIAGNOSIS — Z79899 Other long term (current) drug therapy: Secondary | ICD-10-CM | POA: Insufficient documentation

## 2021-12-04 DIAGNOSIS — I110 Hypertensive heart disease with heart failure: Secondary | ICD-10-CM | POA: Diagnosis not present

## 2021-12-04 DIAGNOSIS — R059 Cough, unspecified: Secondary | ICD-10-CM | POA: Insufficient documentation

## 2021-12-04 DIAGNOSIS — Z7901 Long term (current) use of anticoagulants: Secondary | ICD-10-CM | POA: Insufficient documentation

## 2021-12-04 DIAGNOSIS — E119 Type 2 diabetes mellitus without complications: Secondary | ICD-10-CM | POA: Diagnosis not present

## 2021-12-04 MED ORDER — ACETAMINOPHEN 500 MG PO TABS
1000.0000 mg | ORAL_TABLET | Freq: Once | ORAL | Status: AC
  Administered 2021-12-04: 1000 mg via ORAL
  Filled 2021-12-04: qty 2

## 2021-12-04 MED ORDER — METOPROLOL TARTRATE 50 MG PO TABS
50.0000 mg | ORAL_TABLET | Freq: Once | ORAL | Status: AC
  Administered 2021-12-04: 50 mg via ORAL
  Filled 2021-12-04: qty 1

## 2021-12-04 NOTE — ED Provider Notes (Incomplete)
[facility] Provider Note    Event Date/Time   First MD Initiated Contact with Patient 12/04/21 2301     (approximate)   History   Tremors   HPI  Daniel [name] is a 31 y.o. male with history of atrial fibrillation on Eliquis, CHF, hypertension, diabetes, hyperlipidemia, schizoaffective disorder, conversion disorder who presents to the emergency department with multiple complaints.  Patient states that he feels very weak and states his legs have been giving out on him.  No focal numbness or weakness.  Also reports feeling short of breath.  No chest pain.  States he has had a cough.  Also complains that he feels like his ammonia level is elevated.  No vomiting or diarrhea.  Also states that he feels like his chronic tremors are worse.  Lives in a group home.   History provided by patient.    Past Medical History:  Diagnosis Date   Atrial fibrillation (HCC)    Cardiomyopathy (HCC)    a. 07/2021 Echo: EF 50%, mild LVH, nl RV size/function. No significant valvular disease.   Chest pain    CHF (congestive heart failure) (HCC)    Conversion disorder    Current use of long term anticoagulation    Diabetes mellitus without complication (HCC)    Hyperlipidemia    Hypertension    OSA (obstructive sleep apnea)    Persistent atrial fibrillation and flutter (HCC)    a. CHA2DS2VASc = 3-4   Schizoaffective disorder (HCC)    Seizure disorder (HCC)    TIA (transient ischemic attack)    Urethral stricture    a. 08/2021 s/p cystoscopy and urethral dilation.    Past Surgical History:  Procedure Laterality Date   CYSTOSCOPY WITH URETHRAL DILATATION N/A 09/07/2021   Procedure: CYSTOSCOPY WITH URETHRAL DILATATION CATHETER PLACEMENT;  Surgeon: Riki Altes, MD;  Location: [facility] ORS;  Service: Urology;  Laterality: N/A;    MEDICATIONS:  Prior to Admission medications   Medication Sig Start Date End Date Taking? Authorizing Provider  albuterol (VENTOLIN HFA) 108  (90 Base) MCG/ACT inhaler Inhale 2 puffs into the lungs every 6 (six) hours as needed for wheezing. Patient not taking: Reported on 11/09/2021 07/12/21   [provider]  allopurinol (ZYLOPRIM) 300 MG tablet Take 1 tablet (300 mg total) by mouth daily. Patient not taking: Reported on 11/09/2021 08/29/21   Alford Highland, MD  aspirin 325 MG tablet Take 325 mg by mouth daily. Patient not taking: Reported on 11/04/2021    [provider]  atorvastatin (LIPITOR) 80 MG tablet Take 80 mg by mouth daily. Patient not taking: Reported on 11/04/2021 05/11/21   [provider]  baclofen (LIORESAL) 10 MG tablet Take 1 tablet (10 mg total) by mouth 2 (two) times daily as needed for muscle spasms. Home med. 08/16/21   Darlin Priestly, MD  cyclobenzaprine (FLEXERIL) 5 MG tablet Take 5 mg by mouth 3 (three) times daily as needed for muscle spasms.    [provider]  dapagliflozin propanediol (FARXIGA) 10 MG TABS tablet Take 1 tablet (10 mg total) by mouth daily before breakfast. 11/10/21   Delma Freeze, FNP  diltiazem (CARDIZEM CD) 360 MG 24 hr capsule Take 1 capsule (360 mg total) by mouth daily. 09/10/21   Arnetha Courser, MD  divalproex (DEPAKOTE) 500 MG DR tablet Take 2 tablets (1,000 mg total) by mouth 2 (two) times daily. 09/25/21   Wouk, Wilfred Curtis, MD  ELIQUIS 5 MG TABS tablet Take 5 mg  by mouth 2 (two) times daily. 06/16/21   [provider]  escitalopram (LEXAPRO) 10 MG tablet Take 20 mg by mouth daily. 06/16/21   [provider]  fluticasone (FLONASE) 50 MCG/ACT nasal spray Place 2 sprays into both nostrils 2 (two) times daily. 06/16/21   [provider]  furosemide (LASIX) 20 MG tablet Take 20 mg by mouth daily.    [provider]  HYDROcodone-acetaminophen (NORCO/VICODIN) 5-325 MG tablet Take 1 tablet by mouth every 4 (four) hours as needed. Patient not taking: Reported on 11/04/2021 09/16/21   Etherine Mackowiak, Layla MawKristen N, DO  hydrOXYzine (ATARAX) 25 MG tablet  Take 25 mg by mouth daily. At noon    [provider]  hydrOXYzine (ATARAX) 50 MG tablet Take 50 mg by mouth in the morning and at bedtime.    [provider]  ibuprofen (ADVIL) 800 MG tablet Take 800 mg by mouth every 8 (eight) hours as needed. Patient not taking: Reported on 11/04/2021    [provider]  INVEGA 9 MG 24 hr tablet Take 9 mg by mouth every morning. 07/21/21   [provider]  loratadine (CLARITIN) 10 MG tablet Take 10 mg by mouth daily. 06/16/21   [provider]  melatonin 3 MG TABS tablet Take 3 mg by mouth at bedtime. 06/08/21   [provider]  metoprolol tartrate (LOPRESSOR) 50 MG tablet Take 50 mg by mouth 2 (two) times daily.    [provider]  naproxen (NAPROSYN) 500 MG tablet Take 1 tablet (500 mg total) by mouth 2 (two) times daily with a meal. 10/07/21   Jene EveryKinner, Robert, MD  omeprazole (PRILOSEC) 20 MG capsule Take 20 mg by mouth daily.    [provider]  potassium chloride SA (KLOR-CON M) 20 MEQ tablet Take 1 tablet (20 mEq total) by mouth daily for 7 days. 10/29/21   Sharman CheekStafford, Phillip, MD  promethazine (PHENERGAN) 25 MG tablet Take 25 mg by mouth every 6 (six) hours as needed for nausea or vomiting.    [provider]  sacubitril-valsartan (ENTRESTO) 24-26 MG Take 1 tablet by mouth 2 (two) times daily. 10/27/21   Delma FreezeHackney, Tina A, FNP  tamsulosin (FLOMAX) 0.4 MG CAPS capsule Take 1 capsule (0.4 mg total) by mouth daily. 09/10/21   Arnetha CourserAmin, Sumayya, MD  traMADol (ULTRAM) 50 MG tablet Take 50 mg by mouth every 6 (six) hours as needed. Patient not taking: Reported on 11/04/2021    [provider]    Physical Exam   Triage Vital Signs: ED Triage Vitals  Enc Vitals Group     BP 12/04/21 2236 119/76     Pulse Rate 12/04/21 2236 (!) 105     Resp 12/04/21 2236 18     Temp 12/04/21 2236 98.6 F (37 C)     Temp src --      SpO2 12/04/21 2236 97 %     Weight --      Height --      Head  Circumference --      Peak Flow --      Pain Score 12/04/21 2237 10     Pain Loc --      Pain Edu? --      Excl. in GC? --     Most recent vital signs: Vitals:   12/04/21 2236 12/04/21 2300  BP: 119/76 (!) 101/51  Pulse: (!) 105 80  Resp: 18 18  Temp: 98.6 F (37 C)   SpO2: 97% 96%  CONSTITUTIONAL: Alert and oriented x 3 and responds appropriately to questions.  Obese, chronically ill-appearing HEAD: Normocephalic, atraumatic EYES: Conjunctivae clear, pupils appear equal, sclera nonicteric ENT: normal nose; moist mucous membranes NECK: Supple, normal ROM CARD: Irregularly irregular and tachycardic; S1 and S2 appreciated; no murmurs, no clicks, no rubs, no gallops RESP: Normal chest excursion without splinting or tachypnea; breath sounds clear and equal bilaterally; no wheezes, no rhonchi, no rales, no hypoxia or respiratory distress, speaking full sentences ABD/GI: Normal bowel sounds; non-distended; soft, non-tender, no rebound, no guarding, no peritoneal signs BACK: The back appears normal EXT: Normal ROM in all joints; no deformity noted, no edema; no cyanosis, no calf tenderness or calf swelling SKIN: Normal color for age and race; warm; no rash on exposed skin NEURO: Moves all extremities equally, normal speech, no asterixis, no tremors, no facial asymmetry PSYCH: The patient's mood and manner are appropriate.   ED Results / Procedures / Treatments   LABS: (all labs ordered are listed, but only abnormal results are displayed) Labs Reviewed  CBC WITH DIFFERENTIAL/PLATELET - Abnormal; Notable for the following components:      Result Value   Hemoglobin 12.9 (*)    Abs Immature Granulocytes 0.11 (*)    All other components within normal limits  BASIC METABOLIC PANEL - Abnormal; Notable for the following components:   Glucose, Bld 138 (*)    All other components within normal limits  URINALYSIS, ROUTINE W REFLEX MICROSCOPIC - Abnormal; Notable for the following  components:   Color, Urine YELLOW (*)    APPearance CLEAR (*)    Ketones, ur 5 (*)    All other components within normal limits  MAGNESIUM  AMMONIA  BRAIN NATRIURETIC PEPTIDE  VALPROIC ACID LEVEL  TSH  TROPONIN I (HIGH SENSITIVITY)  TROPONIN I (HIGH SENSITIVITY)     EKG:  EKG Interpretation  Date/Time:  Saturday December 04 2021 22:40:31 EDT Ventricular Rate:  125 PR Interval:    QRS Duration: 102 QT Interval:  366 QTC Calculation: 528 R Axis:   80 Text Interpretation: Atrial fibrillation with rapid ventricular response with premature ventricular or aberrantly conducted complexes T wave abnormality, consider inferolateral ischemia Abnormal ECG When compared with ECG of 28-Nov-2021 15:40, No significant change was found Confirmed by Rochele Raring [phone]) on 12/04/2021 11:30:51 PM         RADIOLOGY: My personal review and interpretation of imaging: Chest x-ray clear  I have personally reviewed all radiology reports.   DG Chest Portable 1 View  Result Date: 12/05/2021 CLINICAL DATA:  Shortness of breath EXAM: PORTABLE CHEST 1 VIEW COMPARISON:  11/28/2021 FINDINGS: The heart size and mediastinal contours are within normal limits. Both lungs are clear. The visualized skeletal structures are unremarkable. IMPRESSION: No active disease. Electronically Signed   By: Alcide Clever M.D.   On: 12/05/2021 01:33     PROCEDURES:  Critical Care performed: No     .1-3 Lead EKG Interpretation  Performed by: Tanea Moga, Layla Maw, DO Authorized by: Akaila Rambo, Layla Maw, DO     Interpretation: abnormal     ECG rate:  105   ECG rate assessment: tachycardic     Rhythm: atrial fibrillation     Ectopy: PVCs     Conduction: normal       IMPRESSION / MDM / ASSESSMENT AND PLAN / ED COURSE  I reviewed the triage vital signs and the nursing notes.    Patient here with multiple complaints.  Complaining of shortness of breath, increasing tremors, generalized  weakness.  The patient is on the  cardiac monitor to evaluate for evidence of arrhythmia and/or significant heart rate changes.   DIFFERENTIAL DIAGNOSIS (includes but not limited to):   A-fib with RVR, electrolyte derangement, anemia, dehydration, UTI, ACS, CHF exacerbation, pneumonia, pneumothorax, PE, hepatic encephalopathy, Depakote toxicity   Patient's presentation is most consistent with acute presentation with potential threat to life or bodily function.   PLAN: We will obtain CBC, BMP, magnesium level, TSH, troponin, BNP, chest x-ray, urinalysis, Depakote level.  He is requesting something for pain.  Will give Tylenol.  We will give him his home dose of metoprolol for his mild tachycardia.  He is chronically in A-fib and is anticoagulated.  Patient placed on oxygen in triage for symptom relief.  Taken off of oxygen while I was in the room and sats are 98% on room air.   MEDICATIONS GIVEN IN ED: Medications  acetaminophen (TYLENOL) tablet 1,000 mg (1,000 mg Oral Given 12/04/21 2337)  metoprolol tartrate (LOPRESSOR) tablet 50 mg (50 mg Oral Given 12/04/21 2337)     ED COURSE: Patient's labs show no leukocytosis, stable hemoglobin.  Normal electrolytes.  Troponin negative.  Ammonia level normal.  BNP normal.  Depakote level normal.  TSH normal.  EKG nonischemic.  Urine shows no sign of infection or dehydration.  Chest x-ray reviewed/interpreted by myself radiologist and shows no infiltrate, edema or pneumothorax.  He remains hemodynamically stable without hypoxia.  Heart rate has improved into the 80s after metoprolol.  Will discharge patient home to his group home.  Attempted to call Kevin Fenton [phone] - [facility]  with no answer at 2:13 AM.  At this time, I do not feel there is any life-threatening condition present. I reviewed all nursing notes, vitals, pertinent previous records.  All lab and urine results, EKGs, imaging ordered have been independently reviewed and interpreted by myself.  I reviewed all available  radiology reports from any imaging ordered this visit.  Based on my assessment, I feel the patient is safe to be discharged home without further emergent workup and can continue workup as an outpatient as needed. Discussed all findings, treatment plan as well as usual and customary return precautions.  They verbalize understanding and are comfortable with this plan.  Outpatient follow-up has been provided as needed.  All questions have been answered.    CONSULTS: No admission needed at this time.  Work-up reassuring.  No A-fib with RVR.  No hypoxia.  No signs of volume overload.   OUTSIDE RECORDS REVIEWED: Reviewed patient's last echocardiogram Oct 22, 2021.  IMPRESSIONS     1. Left ventricular ejection fraction, by estimation, is 45 to 50%  (difficult to estimate in the setting of atrial fibrillation with rate  >100). The left ventricle has mildly decreased function. The left  ventricle demonstrates global hypokinesis. Left  ventricular diastolic parameters are indeterminate.   2. Right ventricular systolic function is normal. The right ventricular  size is normal. Tricuspid regurgitation signal is inadequate for assessing  PA pressure.   3. The mitral valve is normal in structure. No evidence of mitral valve  regurgitation. No evidence of mitral stenosis.   4. The aortic valve is normal in structure. Aortic valve regurgitation is  not visualized. No aortic stenosis is present.   5. The inferior vena cava is normal in size with greater than 50%  respiratory variability, suggesting right atrial pressure of 3 mmHg.        FINAL CLINICAL IMPRESSION(S) / ED DIAGNOSES  Final diagnoses:  Generalized weakness  Shortness of breath     Rx / DC Orders   ED Discharge Orders     None        Note:  This document was prepared using Dragon voice recognition software and may include unintentional dictation errors.   Dewanna Hurston, Layla Maw, DO 12/05/21 0210    Aldean Suddeth, Layla Maw,  DO 12/05/21 [phone]

## 2021-12-04 NOTE — ED Triage Notes (Addendum)
First Nurse Note: this RN notified by bystander an individual had fallen in the parking lot by the emergency room entrance. This RN, Consulting civil engineer, Research officer, trade [employer], EDT to parking lot with security. Pt noted to be laying on ground next to his rollator, pt stating he can't breathe. Pt placed on 4L via [place]. Pt's caregiver OJ with patient at this time, states patient laid himself down on the ground, did not fall. Pt brought back to ER for further evaluation. No obvious injury noted at this time, pt alert and answering questions.    Per caregiver pt was at the sleep study center for a sleep study and patient walked out prior to completing sleep study.

## 2021-12-05 ENCOUNTER — Emergency Department: Payer: Medicaid Other

## 2021-12-05 LAB — BASIC METABOLIC PANEL
Anion gap: 8 (ref 5–15)
BUN: 20 mg/dL (ref 6–20)
CO2: 30 mmol/L (ref 22–32)
Calcium: 8.9 mg/dL (ref 8.9–10.3)
Chloride: 99 mmol/L (ref 98–111)
Creatinine, Ser: 1.13 mg/dL (ref 0.61–1.24)
GFR, Estimated: >60 mL/min (ref >60)
Glucose, Bld: 138 mg/dL — ABNORMAL HIGH (ref 70–99)
Potassium: 4 mmol/L (ref 3.5–5.1)
Sodium: 137 mmol/L (ref 135–145)

## 2021-12-05 LAB — CBC WITH DIFFERENTIAL/PLATELET
Abs Immature Granulocytes: 0.11 10*3/uL — ABNORMAL HIGH (ref 0.00–0.07)
Basophils Absolute: 0 10*3/uL (ref 0.0–0.1)
Basophils Relative: 1 %
Eosinophils Absolute: 0.2 10*3/uL (ref 0.0–0.5)
Eosinophils Relative: 3 %
HCT: 40.7 % (ref 39.0–52.0)
Hemoglobin: 12.9 g/dL — ABNORMAL LOW (ref 13.0–17.0)
Immature Granulocytes: 2 %
Lymphocytes Relative: 23 %
Lymphs Abs: 1.7 10*3/uL (ref 0.7–4.0)
MCH: 29.3 pg (ref 26.0–34.0)
MCHC: 31.7 g/dL (ref 30.0–36.0)
MCV: 92.3 fL (ref 80.0–100.0)
Monocytes Absolute: 0.9 10*3/uL (ref 0.1–1.0)
Monocytes Relative: 12 %
Neutro Abs: 4.3 10*3/uL (ref 1.7–7.7)
Neutrophils Relative %: 59 %
Platelets: 196 10*3/uL (ref 150–400)
RBC: 4.41 MIL/uL (ref 4.22–5.81)
RDW: 14.6 % (ref 11.5–15.5)
WBC: 7.2 10*3/uL (ref 4.0–10.5)
nRBC: 0 % (ref 0.0–0.2)

## 2021-12-05 LAB — VALPROIC ACID LEVEL: Valproic Acid Lvl: 91 ug/mL (ref 50.0–100.0)

## 2021-12-05 LAB — TSH: TSH: 3.947 u[IU]/mL (ref 0.350–4.500)

## 2021-12-05 LAB — URINALYSIS, ROUTINE W REFLEX MICROSCOPIC
Bilirubin Urine: NEGATIVE
Glucose, UA: NEGATIVE mg/dL
Hgb urine dipstick: NEGATIVE
Ketones, ur: 5 mg/dL — AB
Leukocytes,Ua: NEGATIVE
Nitrite: NEGATIVE
Protein, ur: NEGATIVE mg/dL
Specific Gravity, Urine: 1.017 (ref 1.005–1.030)
pH: 6 (ref 5.0–8.0)

## 2021-12-05 LAB — MAGNESIUM: Magnesium: 2.3 mg/dL (ref 1.7–2.4)

## 2021-12-05 LAB — AMMONIA: Ammonia: 23 umol/L (ref 9–35)

## 2021-12-05 LAB — TROPONIN I (HIGH SENSITIVITY)
Troponin I (High Sensitivity): 2 ng/L (ref <18)
Troponin I (High Sensitivity): 3 ng/L (ref <18)

## 2021-12-05 LAB — BRAIN NATRIURETIC PEPTIDE: B Natriuretic Peptide: 83 pg/mL (ref 0.0–100.0)

## 2021-12-05 NOTE — ED Notes (Signed)
Pt's group home attendant called for pickup.

## 2021-12-05 NOTE — Discharge Instructions (Signed)
Your labs, urine, chest x-ray were normal today.  Your chest x-ray did not show any pneumonia or fluid on your lungs.  Your cardiac tests were normal.  Your heart rate has improved with your home metoprolol.  Your ammonia level was normal.  You do not require admission to the hospital.

## 2021-12-05 NOTE — ED Notes (Signed)
Pt claims he can't breathe, but satting at 95%, and states he uses oxygen at home and he's on 4L [place]. Pt appears to hold his breath at times and desat, but when breathing normally he sat at 95-100%.

## 2021-12-07 ENCOUNTER — Ambulatory Visit: Payer: Medicaid Other | Admitting: Nurse Practitioner

## 2021-12-07 NOTE — Progress Notes (Deleted)
Patient ID: Daniel [name], male    DOB: Jun 25, 1990, 31 y.o.   MRN: [id number]  HPI  Daniel [name] is a 31 y/o male with a history of DM, hyperlipidemia, HTN, atrial fibrillation, conversion disorder, sleep apnea, schizoaffective disorder, seizure, TIA, tobacco use and chronic heart failure.   Echo report from 10/22/21 reviewed and showed an EF of 45-50%. Echo report from 08/04/21 reviewed and showed an EF of 50% along with mild LVH  Has had 2 ED visits the month of July. Has been in the ED 8 times the month of June for various complaints. Was in the ED 11/04/21 due to palpitations thought to be due to a fib along with shortness of breath and palpitations. IV cardizem given. Developed AMS with code stroke called. Head CT negative. Glucose dropping so amp of D50 provided. Neurology evaluation done. He was released. Was in the ED 11/01/21, 10/29/21, 10/27/21, 10/24/21 and also 10/21/21. Was in the ED 10/16/21 due to chest pain and c/o current caregiver. Found to be in AF RVR. Given IV metoprolol along with IV lasix. Transitioned to oral medications. Head/cervical CT done because he says that he had a previous fall. Evaluated and subsequently released. Has had 7 other ED visits the month of May. In April had 9 ED visits along with 1 admission. Had 4 ED visits and 3 admissions in March.   He presents today for a follow-up visit with a chief complaint of   Patient and caregiver both confirm that he's currently not getting any mental health care with counseling or therapy.   Past Medical History:  Diagnosis Date   Atrial fibrillation (HCC)    Cardiomyopathy (HCC)    a. 07/2021 Echo: EF 50%, mild LVH, nl RV size/function. No significant valvular disease.   Chest pain    CHF (congestive heart failure) (HCC)    Conversion disorder    Current use of long term anticoagulation    Diabetes mellitus without complication (HCC)    Hyperlipidemia    Hypertension    OSA (obstructive sleep apnea)    Persistent atrial  fibrillation and flutter (HCC)    a. CHA2DS2VASc = 3-4   Schizoaffective disorder (HCC)    Seizure disorder (HCC)    TIA (transient ischemic attack)    Urethral stricture    a. 08/2021 s/p cystoscopy and urethral dilation.   Past Surgical History:  Procedure Laterality Date   CYSTOSCOPY WITH URETHRAL DILATATION N/A 09/07/2021   Procedure: CYSTOSCOPY WITH URETHRAL DILATATION CATHETER PLACEMENT;  Surgeon: Riki Altes, MD;  Location: [facility] ORS;  Service: Urology;  Laterality: N/A;   No family history on file. Social History   Tobacco Use   Smoking status: Former    Types: Cigarettes   Smokeless tobacco: Never  Substance Use Topics   Alcohol use: Not Currently   Allergies  Allergen Reactions   Haldol [Haloperidol] Other (See Comments)    SI   Abilify [Aripiprazole] Palpitations   Demerol [Meperidine Hcl] Hives    Review of Systems  Constitutional:  Positive for fatigue. Negative for appetite change.  HENT:  Positive for congestion and rhinorrhea. Negative for sore throat.   Eyes: Negative.   Respiratory:  Positive for apnea. Negative for cough, shortness of breath and wheezing.        + snoring  Cardiovascular:  Positive for leg swelling (improving). Negative for chest pain and palpitations.  Gastrointestinal:  Negative for abdominal distention and abdominal pain.  Endocrine: Negative.   Genitourinary:  Negative for  dysuria.  Musculoskeletal:  Positive for arthralgias (left knee) and back pain. Negative for neck pain.  Skin: Negative.   Allergic/Immunologic: Negative.   Neurological:  Positive for light-headedness. Negative for dizziness and tremors.  Hematological:  Negative for adenopathy. Bruises/bleeds easily.  Psychiatric/Behavioral:  Positive for dysphoric mood and sleep disturbance (sleeping on 3 pillows).      Physical Exam Vitals and nursing note reviewed. Exam conducted with a chaperone present (caregiver).  Constitutional:      Appearance: Normal  appearance.  HENT:     Head: Normocephalic and atraumatic.  Cardiovascular:     Rate and Rhythm: Normal rate. Rhythm irregular.  Pulmonary:     Effort: Pulmonary effort is normal. No respiratory distress.     Breath sounds: No wheezing, rhonchi or rales.  Abdominal:     General: There is no distension.     Palpations: Abdomen is soft.  Musculoskeletal:        General: No tenderness.     Cervical back: Normal range of motion.     Right lower leg: Edema (1+ pitting) present.     Left lower leg: Edema (1+ pitting) present.  Skin:    General: Skin is warm and dry.  Neurological:     Mental Status: He is alert and oriented to person, place, and time. Mental status is at baseline.  Psychiatric:        Attention and Perception: Attention normal.        Mood and Affect: Mood is depressed. Mood is not anxious. Affect is flat.        Behavior: Behavior is not agitated.     Comments: Has a smile on his face today   Assessment & Plan:  1: Chronic heart failure with mildly reduced ejection fraction- - NYHA class II - minimally fluid overloaded today with continued pedal edema - not weighing as he says the scales that were provided "aren't working"; advised caregiver to get him a scale so that he can weigh daily and call for an overnight weight gain of > 2 pounds or a weekly weight gain of > 5 pounds - weight 400 pounds from last visit here 1 month ago  - not adding salt and says that he doesn't add salt to his food but does eat saltier foods on occasion - emphasized keeping daily fluid intake to 60 ounces daily & rationale for this explained - on GDMT of entresto  - BNP 12/04/21 was 83.0  2: HTN- - BP  - sees PCP at the group home  - BMP 12/04/21 reviewed and showed sodium 137, potassium 4.0, creatinine 1.13 and GFR >60  3: Persistent atrial fibrillation- - saw cardiology Brion Aliment) 10/07/21; returns July - HR irregular today  4: Schizophrenia- - on invega daily - caregiver says that  prior to coming to live with them February of this year, he frequented the ED "all the time" where he lived in [place] - talked with patient about his mental health needs and how this could be driving his physical health complaints; he is open to receiving mental health care and says that he knows he needs to talk to someone - order written for facility to get the PCP to make mental health referral - emphasized that we wanted to help him feel better so that he will not feel like he has to come to the ED; he is much more relaxed today  - he acknowledges that the ED is his "safe spot"  5: Sleep apnea- -  saw pulmonology Landry Mellow) 11/08/21 - awaiting sleep study to rule out sleep apnea - wearing oxygen at 4L around the clock when at the group home   Medication list reviewed.

## 2021-12-07 NOTE — Progress Notes (Deleted)
Office Visit    Patient Name: Daniel [name] Date of Encounter: 12/07/2021  Primary Care Provider:  Anselm Jungling, NP Primary Cardiologist:  Yvonne Kendall, MD  Chief Complaint    31 year old male with a history of hypertension, hyperlipidemia, sleep disordered breathing, obesity, possible TIA, low normal LV function, schizoaffective disorder, seizure disorder, conversion disorder, and persistent atrial fibrillation/flutter, presents for follow-up of atrial fibrillation.  Past Medical History    Past Medical History:  Diagnosis Date   Atrial fibrillation (HCC)    Cardiomyopathy (HCC)    a. 07/2021 Echo: EF 50%, mild LVH, nl RV size/function. No significant valvular disease.   Chest pain    CHF (congestive heart failure) (HCC)    Conversion disorder    Current use of long term anticoagulation    Diabetes mellitus without complication (HCC)    Hyperlipidemia    Hypertension    OSA (obstructive sleep apnea)    Persistent atrial fibrillation and flutter (HCC)    a. CHA2DS2VASc = 3-4   Schizoaffective disorder (HCC)    Seizure disorder (HCC)    TIA (transient ischemic attack)    Urethral stricture    a. 08/2021 s/p cystoscopy and urethral dilation.   Past Surgical History:  Procedure Laterality Date   CYSTOSCOPY WITH URETHRAL DILATATION N/A 09/07/2021   Procedure: CYSTOSCOPY WITH URETHRAL DILATATION CATHETER PLACEMENT;  Surgeon: Riki Altes, MD;  Location: [facility] OR[facility];  Service: Urology;  Laterality: N/A;    Allergies  Allergies  Allergen Reactions   Haldol [Haloperidol] Other (See Comments)    SI   Abilify [Aripiprazole] Palpitations   Demerol [Meperidine Hcl] Hives    History of Present Illness    31 year old male with above complex past medical history including hypertension, hyperlipidemia, sleep disordered breathing, obesity, possible TIA, low normal LV function, schizoaffective disorder, seizure disorder, conversion disorder, and persistent atrial  fibrillation and flutter.  He reports a history of atrial fibrillation dating back to age 55 and has been managed with diltiazem and Eliquis in the outpatient setting by his previous cardiology team in [place].  He moved to the [place] area in early 2023 to live in a group home and was admitted March 6 with chest pain and palpitations.  He was noted to be in atrial fibrillation.  There was question of a TIA and neuro work-up was unremarkable.  He was seen by EP and was felt to be a poor candidate for catheter ablation secondary to obesity, nonadherence, and untreated sleep apnea.  Echo showed an EF of 50% with mild LVH and normal RV function.  He was discharged home on metoprolol, diltiazem, and Xarelto.  Toprol was subsequently reduced to 50 mg twice daily in the setting of soft blood pressures and weakness and fall in late March.  Since early April 2023, has been dealing with urinary frequency and recurrent UTIs.  On April 11, he underwent cystoscopy and urethral dilation and postprocedure complained of pelvic discomfort and was noted to have rapid atrial flutter with rates in the 140s.  With pain management and resumption of home medications, rates improved.  He has had 30 ED visits since mid April.  He has also been seen in heart failure clinic on several occasions, most recently on June 14, at which time he was felt to be mildly volume overloaded with ongoing pedal edema 1 day after receiving 80 mg IV Lasix in the outpatient setting.  Home Medications    Current Outpatient Medications  Medication Sig Dispense Refill  albuterol (VENTOLIN HFA) 108 (90 Base) MCG/ACT inhaler Inhale 2 puffs into the lungs every 6 (six) hours as needed for wheezing. (Patient not taking: Reported on 11/09/2021)     allopurinol (ZYLOPRIM) 300 MG tablet Take 1 tablet (300 mg total) by mouth daily. (Patient not taking: Reported on 11/09/2021) 30 tablet 0   aspirin 325 MG tablet Take 325 mg by mouth daily. (Patient not taking:  Reported on 11/04/2021)     atorvastatin (LIPITOR) 80 MG tablet Take 80 mg by mouth daily. (Patient not taking: Reported on 11/04/2021)     baclofen (LIORESAL) 10 MG tablet Take 1 tablet (10 mg total) by mouth 2 (two) times daily as needed for muscle spasms. Home med. 30 each 0   cyclobenzaprine (FLEXERIL) 5 MG tablet Take 5 mg by mouth 3 (three) times daily as needed for muscle spasms.     dapagliflozin propanediol (FARXIGA) 10 MG TABS tablet Take 1 tablet (10 mg total) by mouth daily before breakfast. 90 tablet 3   diltiazem (CARDIZEM CD) 360 MG 24 hr capsule Take 1 capsule (360 mg total) by mouth daily. 30 capsule 1   divalproex (DEPAKOTE) 500 MG DR tablet Take 2 tablets (1,000 mg total) by mouth 2 (two) times daily.     ELIQUIS 5 MG TABS tablet Take 5 mg by mouth 2 (two) times daily.     escitalopram (LEXAPRO) 10 MG tablet Take 20 mg by mouth daily.     fluticasone (FLONASE) 50 MCG/ACT nasal spray Place 2 sprays into both nostrils 2 (two) times daily.     furosemide (LASIX) 20 MG tablet Take 20 mg by mouth daily.     HYDROcodone-acetaminophen (NORCO/VICODIN) 5-325 MG tablet Take 1 tablet by mouth every 4 (four) hours as needed. (Patient not taking: Reported on 11/04/2021) 15 tablet 0   hydrOXYzine (ATARAX) 25 MG tablet Take 25 mg by mouth daily. At noon     hydrOXYzine (ATARAX) 50 MG tablet Take 50 mg by mouth in the morning and at bedtime.     ibuprofen (ADVIL) 800 MG tablet Take 800 mg by mouth every 8 (eight) hours as needed. (Patient not taking: Reported on 11/04/2021)     INVEGA 9 MG 24 hr tablet Take 9 mg by mouth every morning.     loratadine (CLARITIN) 10 MG tablet Take 10 mg by mouth daily.     melatonin 3 MG TABS tablet Take 3 mg by mouth at bedtime.     metoprolol tartrate (LOPRESSOR) 50 MG tablet Take 50 mg by mouth 2 (two) times daily.     naproxen (NAPROSYN) 500 MG tablet Take 1 tablet (500 mg total) by mouth 2 (two) times daily with a meal. 20 tablet 2   omeprazole (PRILOSEC) 20 MG  capsule Take 20 mg by mouth daily.     potassium chloride SA (KLOR-CON M) 20 MEQ tablet Take 1 tablet (20 mEq total) by mouth daily for 7 days. 7 tablet 0   promethazine (PHENERGAN) 25 MG tablet Take 25 mg by mouth every 6 (six) hours as needed for nausea or vomiting.     sacubitril-valsartan (ENTRESTO) 24-26 MG Take 1 tablet by mouth 2 (two) times daily. 60 tablet 3   tamsulosin (FLOMAX) 0.4 MG CAPS capsule Take 1 capsule (0.4 mg total) by mouth daily. 30 capsule 2   traMADol (ULTRAM) 50 MG tablet Take 50 mg by mouth every 6 (six) hours as needed. (Patient not taking: Reported on 11/04/2021)     No current facility-administered  medications for this visit.     Review of Systems    ***.  All other systems reviewed and are otherwise negative except as noted above.    Physical Exam    VS:  There were no vitals taken for this visit. , BMI There is no height or weight on file to calculate BMI.     GEN: Well nourished, well developed, in no acute distress. HEENT: normal. Neck: Supple, no JVD, carotid bruits, or masses. Cardiac: RRR, no murmurs, rubs, or gallops. No clubbing, cyanosis, edema.  Radials/DP/PT 2+ and equal bilaterally.  Respiratory:  Respirations regular and unlabored, clear to auscultation bilaterally. GI: Soft, nontender, nondistended, BS + x 4. MS: no deformity or atrophy. Skin: warm and dry, no rash. Neuro:  Strength and sensation are intact. Psych: Normal affect.  Accessory Clinical Findings    ECG personally reviewed by me today - *** - no acute changes.  Lab Results  Component Value Date   WBC 7.2 12/04/2021   HGB 12.9 (L) 12/04/2021   HCT 40.7 12/04/2021   MCV 92.3 12/04/2021   PLT 196 12/04/2021   Lab Results  Component Value Date   CREATININE 1.13 12/04/2021   BUN 20 12/04/2021   NA 137 12/04/2021   K 4.0 12/04/2021   CL 99 12/04/2021   CO2 30 12/04/2021   Lab Results  Component Value Date   ALT 19 11/19/2021   AST 26 11/19/2021   ALKPHOS 63  11/19/2021   BILITOT 0.4 11/19/2021   Lab Results  Component Value Date   CHOL 133 08/29/2021   HDL 32 (L) 08/29/2021   LDLCALC 50 08/29/2021   TRIG 255 (H) 08/29/2021   CHOLHDL 4.2 08/29/2021    Lab Results  Component Value Date   HGBA1C 6.2 (H) 09/04/2021    Assessment & Plan    1.  ***   Nicolasa Ducking, NP 12/07/2021, 1:28 PM

## 2021-12-08 ENCOUNTER — Encounter: Payer: Self-pay | Admitting: Nurse Practitioner

## 2021-12-08 ENCOUNTER — Ambulatory Visit: Payer: Medicaid Other | Admitting: Family

## 2021-12-08 ENCOUNTER — Telehealth: Payer: Self-pay | Admitting: Family

## 2021-12-08 NOTE — Telephone Encounter (Addendum)
Called and attempted to reach both caregivers of patient (OJ and Kevin Fenton) on both 7/11 and 7/12 to confirm patients appointment with [place] on 7/12 and was unable to reach either so we cancelled patients appt with [place] today. I called heart care and had his no show appt for an echo yesterday ( 7/11) rescheduled for patient to the end of aug. We will attempt to reschedule patients appt with [facility] after we have spoken to caregivers.   Marielle Mantione, NT

## 2021-12-11 ENCOUNTER — Emergency Department
Admission: EM | Admit: 2021-12-11 | Discharge: 2021-12-11 | Disposition: A | Discharge status: Home / Self Care | Payer: Medicaid Other | Attending: Emergency Medicine | Admitting: Emergency Medicine

## 2021-12-11 ENCOUNTER — Other Ambulatory Visit: Payer: Self-pay

## 2021-12-11 ENCOUNTER — Encounter: Payer: Self-pay | Admitting: Emergency Medicine

## 2021-12-11 ENCOUNTER — Emergency Department: Payer: Medicaid Other

## 2021-12-11 DIAGNOSIS — E119 Type 2 diabetes mellitus without complications: Secondary | ICD-10-CM | POA: Diagnosis not present

## 2021-12-11 DIAGNOSIS — Z7901 Long term (current) use of anticoagulants: Secondary | ICD-10-CM | POA: Insufficient documentation

## 2021-12-11 DIAGNOSIS — I509 Heart failure, unspecified: Secondary | ICD-10-CM

## 2021-12-11 DIAGNOSIS — M79646 Pain in unspecified finger(s): Secondary | ICD-10-CM | POA: Diagnosis not present

## 2021-12-11 DIAGNOSIS — R0789 Other chest pain: Secondary | ICD-10-CM | POA: Diagnosis present

## 2021-12-11 LAB — BASIC METABOLIC PANEL
Anion gap: 8 (ref 5–15)
BUN: 13 mg/dL (ref 6–20)
CO2: 24 mmol/L (ref 22–32)
Calcium: 8.9 mg/dL (ref 8.9–10.3)
Chloride: 105 mmol/L (ref 98–111)
Creatinine, Ser: 1.03 mg/dL (ref 0.61–1.24)
GFR, Estimated: >60 mL/min (ref >60)
Glucose, Bld: 110 mg/dL — ABNORMAL HIGH (ref 70–99)
Potassium: 4.3 mmol/L (ref 3.5–5.1)
Sodium: 137 mmol/L (ref 135–145)

## 2021-12-11 LAB — CBC
HCT: 40.8 % (ref 39.0–52.0)
Hemoglobin: 13.3 g/dL (ref 13.0–17.0)
MCH: 29.7 pg (ref 26.0–34.0)
MCHC: 32.6 g/dL (ref 30.0–36.0)
MCV: 91.1 fL (ref 80.0–100.0)
Platelets: 223 10*3/uL (ref 150–400)
RBC: 4.48 MIL/uL (ref 4.22–5.81)
RDW: 14 % (ref 11.5–15.5)
WBC: 6.1 10*3/uL (ref 4.0–10.5)
nRBC: 0 % (ref 0.0–0.2)

## 2021-12-11 LAB — TROPONIN I (HIGH SENSITIVITY): Troponin I (High Sensitivity): 4 ng/L (ref <18)

## 2021-12-11 MED ORDER — BACITRACIN ZINC 500 UNIT/GM EX OINT
TOPICAL_OINTMENT | Freq: Once | CUTANEOUS | Status: AC
  Filled 2021-12-11: qty 0.9

## 2021-12-11 NOTE — ED Notes (Signed)
Provided with meal tray.
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
## 2021-12-14 NOTE — Progress Notes (Unsigned)
Patient ID: Daniel [name], male    DOB: 1991/05/27, 31 y.o.   MRN: [id number]  HPI  Daniel [name] is a 31 y/o male with a history of DM, hyperlipidemia, HTN, atrial fibrillation, conversion disorder, sleep apnea, schizoaffective disorder, seizure, TIA, tobacco use and chronic heart failure.   Echo report from 10/22/21 reviewed and showed an EF of 45-50%. Echo report from 08/04/21 reviewed and showed an EF of 50% along with mild LVH  Has had 3 ED visits the month of July. Has been in the ED 8 times the month of June for various complaints. Was in the ED 11/04/21 due to palpitations thought to be due to a fib along with shortness of breath and palpitations. IV cardizem given. Developed AMS with code stroke called. Head CT negative. Glucose dropping so amp of D50 provided. Neurology evaluation done. He was released. Was in the ED 11/01/21, 10/29/21, 10/27/21, 10/24/21 and also 10/21/21. Was in the ED 10/16/21 due to chest pain and c/o current caregiver. Found to be in AF RVR. Given IV metoprolol along with IV lasix. Transitioned to oral medications. Head/cervical CT done because he says that he had a previous fall. Evaluated and subsequently released. Has had 7 other ED visits the month of May. In April had 9 ED visits along with 1 admission. Had 4 ED visits and 3 admissions in March.   He presents today for a follow-up visit with a chief complaint of minimal fatigue upon moderate exertion. Describes this as chronic in nature. He has associated head congestion, apnea, pedal edema, rhinorrhea, difficulty sleeping and light-headedness along with this. He denies any abdominal distention, palpitations, chest pain, wheezing, shortness of breath, cough or weight gain.   Says that he's doing things different and is going to focus on staying out of the ER. Says that he needs a support group for people that are addicted to going to the ER.   Did not get his sleep study completed because caregiver says that he was "showing  out" and had to leave. Has started his mental health care and currently participating in group therapy.   Still has not gotten his farxiga or entresto from his pharmacy. Caregiver says that the PA's have been done but that it's still not being approved. Caregiver thinks it's because patient has used so much money going back and forth to the ER that medicaid may be now limiting things with him.   Past Medical History:  Diagnosis Date   Atrial fibrillation (HCC)    Cardiomyopathy (HCC)    a. 07/2021 Echo: EF 50%, mild LVH, nl RV size/function. No significant valvular disease.   Chest pain    CHF (congestive heart failure) (HCC)    Conversion disorder    Current use of long term anticoagulation    Diabetes mellitus without complication (HCC)    Hyperlipidemia    Hypertension    OSA (obstructive sleep apnea)    Persistent atrial fibrillation and flutter (HCC)    a. CHA2DS2VASc = 3-4   Schizoaffective disorder (HCC)    Seizure disorder (HCC)    TIA (transient ischemic attack)    Urethral stricture    a. 08/2021 s/p cystoscopy and urethral dilation.   Past Surgical History:  Procedure Laterality Date   CYSTOSCOPY WITH URETHRAL DILATATION N/A 09/07/2021   Procedure: CYSTOSCOPY WITH URETHRAL DILATATION CATHETER PLACEMENT;  Surgeon: Riki Altes, MD;  Location: [facility];  Service: Urology;  Laterality: N/A;   No family history on file. Social History  Tobacco Use   Smoking status: Former    Types: Cigarettes   Smokeless tobacco: Never  Substance Use Topics   Alcohol use: Not Currently   Allergies  Allergen Reactions   Haldol [Haloperidol] Other (See Comments)    SI   Abilify [Aripiprazole] Palpitations   Demerol [Meperidine Hcl] Hives   Prior to Admission medications   Medication Sig Start Date End Date Taking? Authorizing Provider  albuterol (VENTOLIN HFA) 108 (90 Base) MCG/ACT inhaler Inhale 2 puffs into the lungs every 6 (six) hours as needed for wheezing. 07/12/21  Yes  [provider]  allopurinol (ZYLOPRIM) 300 MG tablet Take 1 tablet (300 mg total) by mouth daily. 08/29/21  Yes Wieting, Richard, MD  aspirin 325 MG tablet Take 325 mg by mouth daily.   Yes [provider]  atorvastatin (LIPITOR) 80 MG tablet Take 80 mg by mouth daily. 05/11/21  Yes [provider]  baclofen (LIORESAL) 10 MG tablet Take 1 tablet (10 mg total) by mouth 2 (two) times daily as needed for muscle spasms. Home med. 08/16/21  Yes Darlin Priestly, MD  cyclobenzaprine (FLEXERIL) 5 MG tablet Take 5 mg by mouth 3 (three) times daily as needed for muscle spasms.   Yes [provider]  diltiazem (CARDIZEM CD) 360 MG 24 hr capsule Take 1 capsule (360 mg total) by mouth daily. 09/10/21  Yes Arnetha Courser, MD  divalproex (DEPAKOTE) 500 MG DR tablet Take 2 tablets (1,000 mg total) by mouth 2 (two) times daily. 09/25/21  Yes Wouk, Wilfred Curtis, MD  ELIQUIS 5 MG TABS tablet Take 5 mg by mouth 2 (two) times daily. 06/16/21  Yes [provider]  escitalopram (LEXAPRO) 10 MG tablet Take 20 mg by mouth daily. 06/16/21  Yes [provider]  fluticasone (FLONASE) 50 MCG/ACT nasal spray Place 2 sprays into both nostrils 2 (two) times daily. 06/16/21  Yes [provider]  furosemide (LASIX) 20 MG tablet Take 20 mg by mouth daily.   Yes [provider]  HYDROcodone-acetaminophen (NORCO/VICODIN) 5-325 MG tablet Take 1 tablet by mouth every 4 (four) hours as needed. 09/16/21  Yes Ward, Layla Maw, DO  hydrOXYzine (ATARAX) 25 MG tablet Take 25 mg by mouth daily. At noon   Yes [provider]  hydrOXYzine (ATARAX) 50 MG tablet Take 50 mg by mouth in the morning and at bedtime.   Yes [provider]  ibuprofen (ADVIL) 800 MG tablet Take 800 mg by mouth every 8 (eight) hours as needed.   Yes [provider]  INVEGA 9 MG 24 hr tablet Take 9 mg by mouth every morning. 07/21/21  Yes [provider]  lithium carbonate (LITHOBID)  300 MG CR tablet Take 300 mg by mouth 2 (two) times daily.   Yes [provider]  loratadine (CLARITIN) 10 MG tablet Take 10 mg by mouth daily. 06/16/21  Yes [provider]  melatonin 3 MG TABS tablet Take 3 mg by mouth at bedtime. 06/08/21  Yes [provider]  metoprolol tartrate (LOPRESSOR) 50 MG tablet Take 50 mg by mouth 2 (two) times daily.   Yes [provider]  naproxen (NAPROSYN) 500 MG tablet Take 1 tablet (500 mg total) by mouth 2 (two) times daily with a meal. 10/07/21  Yes Jene Every, MD  omeprazole (PRILOSEC) 20 MG capsule Take 20 mg by mouth daily.   Yes [provider]  promethazine (PHENERGAN) 25 MG tablet Take 25 mg by mouth every 6 (six) hours as needed  for nausea or vomiting.   Yes [provider]  tamsulosin (FLOMAX) 0.4 MG CAPS capsule Take 1 capsule (0.4 mg total) by mouth daily. 09/10/21  Yes Arnetha Courser, MD  traMADol (ULTRAM) 50 MG tablet Take 50 mg by mouth every 6 (six) hours as needed.   Yes [provider]  dapagliflozin propanediol (FARXIGA) 10 MG TABS tablet Take 1 tablet (10 mg total) by mouth daily before breakfast. Patient not taking: Reported on 12/15/2021 11/10/21   [name] A, FNP  potassium chloride SA (KLOR-CON M) 20 MEQ tablet Take 1 tablet (20 mEq total) by mouth daily for 7 days. Patient not taking: Reported on 12/15/2021 10/29/21   Sharman Cheek, MD  sacubitril-valsartan (ENTRESTO) 24-26 MG Take 1 tablet by mouth 2 (two) times daily. Patient not taking: Reported on 12/15/2021 10/27/21   Delma Freeze, FNP    Review of Systems  Constitutional:  Positive for fatigue. Negative for appetite change.  HENT:  Positive for congestion and rhinorrhea. Negative for sore throat.   Eyes: Negative.   Respiratory:  Positive for apnea. Negative for cough, shortness of breath and wheezing.        + snoring  Cardiovascular:  Positive for leg swelling (improving). Negative for chest pain and  palpitations.  Gastrointestinal:  Negative for abdominal distention and abdominal pain.  Endocrine: Negative.   Musculoskeletal:  Positive for arthralgias (left knee) and back pain. Negative for neck pain.  Skin: Negative.   Allergic/Immunologic: Negative.   Neurological:  Positive for light-headedness. Negative for dizziness and tremors.  Hematological:  Negative for adenopathy. Bruises/bleeds easily.  Psychiatric/Behavioral:  Positive for sleep disturbance (sleeping on 3 pillows). Negative for dysphoric mood. The patient is not nervous/anxious.    Vitals:   12/15/21 1331  BP: 125/78  Pulse: 68  Resp: 16  SpO2: 98%  Weight: (!) 401 lb (181.9 kg)   Wt Readings from Last 3 Encounters:  12/15/21 (!) 401 lb (181.9 kg)  12/11/21 (!) 340 lb (154.2 kg)  11/28/21 (!) 374 lb 12.5 oz (170 kg)   Lab Results  Component Value Date   CREATININE 1.03 12/11/2021   CREATININE 1.13 12/04/2021   CREATININE 0.97 11/28/2021   Physical Exam Vitals and nursing note reviewed. Exam conducted with a chaperone present (caregiver).  Constitutional:      Appearance: Normal appearance.  HENT:     Head: Normocephalic and atraumatic.  Cardiovascular:     Rate and Rhythm: Normal rate. Rhythm irregular.  Pulmonary:     Effort: Pulmonary effort is normal. No respiratory distress.     Breath sounds: No wheezing, rhonchi or rales.  Abdominal:     General: There is no distension.     Palpations: Abdomen is soft.  Musculoskeletal:        General: No tenderness.     Cervical back: Normal range of motion.     Right lower leg: Edema (1+ pitting) present.     Left lower leg: Edema (1+ pitting) present.  Skin:    General: Skin is warm and dry.  Neurological:     Mental Status: He is alert and oriented to person, place, and time. Mental status is at baseline.  Psychiatric:        Attention and Perception: Attention normal.        Mood and Affect: Mood is not anxious or depressed. Affect is not flat.         Behavior: Behavior is not agitated.     Comments: Has a  smile on his face interactive today   Assessment & Plan:  1: Chronic heart failure with mildly reduced ejection fraction- - NYHA class II - euvolemic - weighing daily; reminded to call for an overnight weight gain of > 2 pounds or a weekly weight gain of > 5 pounds - weight up 1 pound from last visit here 1 month ago; previous weights listed are from ED visits and were stated weights - not adding salt and says that he doesn't add salt to his food but does eat saltier foods on occasion - emphasized keeping daily fluid intake to 60 ounces daily & rationale for this explained - has still been unable to obtain farxiga or entresto; RN left message for pharmacy to call back with what the issues are - if he can't get entresto approved, will start valsartan - BNP 12/04/21 was 83.0  2: HTN- - BP looks good (125/78) - sees PCP at the group home  - BMP 12/11/21 reviewed and showed sodium 137, potassium 4.3, creatinine 1.03 and GFR >60  3: Persistent atrial fibrillation- - saw cardiology Brion Aliment) 10/07/21; returns next month - HR irregular   4: Schizophrenia- - on invega daily - caregiver says that prior to coming to live with them February of this year, he frequented the ED "all the time" where he lived in [place] -has been getting mental health care via group therapy since he was last here and says that he wants to improve and stop going to the ER all the time - much more interactive and smiling while in the office - he acknowledges that the ED is his "safe spot"  5: Sleep apnea- - saw pulmonology Landry Mellow) 11/08/21 - was supposed to have gotten his sleep study but wasn't being cooperative so he had to leave - wearing oxygen at 4L around the clock    Medication list reviewed.   Return in 2 months, sooner if needed.

## 2021-12-15 ENCOUNTER — Encounter: Payer: Self-pay | Admitting: Family

## 2021-12-15 ENCOUNTER — Other Ambulatory Visit: Payer: Self-pay

## 2021-12-15 ENCOUNTER — Ambulatory Visit: Payer: Medicaid Other | Attending: Family | Admitting: Family

## 2021-12-15 VITALS — BP 125/78 | HR 68 | Resp 16 | Wt >= 6400 oz

## 2021-12-15 DIAGNOSIS — F250 Schizoaffective disorder, bipolar type: Secondary | ICD-10-CM | POA: Diagnosis not present

## 2021-12-15 DIAGNOSIS — I110 Hypertensive heart disease with heart failure: Secondary | ICD-10-CM | POA: Diagnosis present

## 2021-12-15 DIAGNOSIS — I10 Essential (primary) hypertension: Secondary | ICD-10-CM | POA: Diagnosis not present

## 2021-12-15 DIAGNOSIS — I5022 Chronic systolic (congestive) heart failure: Secondary | ICD-10-CM | POA: Insufficient documentation

## 2021-12-15 DIAGNOSIS — I4811 Longstanding persistent atrial fibrillation: Secondary | ICD-10-CM | POA: Diagnosis not present

## 2021-12-15 DIAGNOSIS — I4819 Other persistent atrial fibrillation: Secondary | ICD-10-CM | POA: Diagnosis not present

## 2021-12-15 DIAGNOSIS — I509 Heart failure, unspecified: Secondary | ICD-10-CM | POA: Diagnosis present

## 2021-12-15 DIAGNOSIS — G4730 Sleep apnea, unspecified: Secondary | ICD-10-CM | POA: Diagnosis not present

## 2021-12-15 NOTE — Patient Instructions (Signed)
Continue weighing daily and call for an overnight weight gain of 3 pounds or more or a weekly weight gain of more than 5 pounds.   If you have voicemail, please make sure your mailbox is cleaned out so that we may leave a message and please make sure to listen to any voicemails.     

## 2021-12-19 ENCOUNTER — Emergency Department
Admission: EM | Admit: 2021-12-19 | Discharge: 2021-12-20 | Disposition: A | Discharge status: Home / Self Care | Payer: Medicaid Other | Attending: Emergency Medicine | Admitting: Emergency Medicine

## 2021-12-19 ENCOUNTER — Emergency Department: Payer: Medicaid Other

## 2021-12-19 ENCOUNTER — Other Ambulatory Visit: Payer: Self-pay

## 2021-12-19 DIAGNOSIS — Z7901 Long term (current) use of anticoagulants: Secondary | ICD-10-CM | POA: Insufficient documentation

## 2021-12-19 DIAGNOSIS — E871 Hypo-osmolality and hyponatremia: Secondary | ICD-10-CM | POA: Insufficient documentation

## 2021-12-19 DIAGNOSIS — I110 Hypertensive heart disease with heart failure: Secondary | ICD-10-CM | POA: Insufficient documentation

## 2021-12-19 DIAGNOSIS — R079 Chest pain, unspecified: Secondary | ICD-10-CM | POA: Insufficient documentation

## 2021-12-19 DIAGNOSIS — E119 Type 2 diabetes mellitus without complications: Secondary | ICD-10-CM | POA: Diagnosis not present

## 2021-12-19 DIAGNOSIS — R778 Other specified abnormalities of plasma proteins: Secondary | ICD-10-CM

## 2021-12-19 DIAGNOSIS — D500 Iron deficiency anemia secondary to blood loss (chronic): Secondary | ICD-10-CM | POA: Diagnosis not present

## 2021-12-19 DIAGNOSIS — I4891 Unspecified atrial fibrillation: Secondary | ICD-10-CM | POA: Diagnosis not present

## 2021-12-19 DIAGNOSIS — I509 Heart failure, unspecified: Secondary | ICD-10-CM | POA: Diagnosis not present

## 2021-12-19 DIAGNOSIS — M545 Low back pain, unspecified: Secondary | ICD-10-CM | POA: Diagnosis not present

## 2021-12-19 DIAGNOSIS — R109 Unspecified abdominal pain: Secondary | ICD-10-CM

## 2021-12-19 DIAGNOSIS — R531 Weakness: Secondary | ICD-10-CM | POA: Diagnosis not present

## 2021-12-19 DIAGNOSIS — R1084 Generalized abdominal pain: Secondary | ICD-10-CM | POA: Diagnosis not present

## 2021-12-19 LAB — URINALYSIS, ROUTINE W REFLEX MICROSCOPIC
Bilirubin Urine: NEGATIVE
Glucose, UA: NEGATIVE mg/dL
Hgb urine dipstick: NEGATIVE
Ketones, ur: NEGATIVE mg/dL
Nitrite: NEGATIVE
Protein, ur: NEGATIVE mg/dL
Specific Gravity, Urine: 1.019 (ref 1.005–1.030)
pH: 5 (ref 5.0–8.0)

## 2021-12-19 LAB — COMPREHENSIVE METABOLIC PANEL
ALT: 17 U/L (ref 0–44)
AST: 24 U/L (ref 15–41)
Albumin: 4.5 g/dL (ref 3.5–5.0)
Alkaline Phosphatase: 60 U/L (ref 38–126)
Anion gap: 7 (ref 5–15)
BUN: 15 mg/dL (ref 6–20)
CO2: 26 mmol/L (ref 22–32)
Calcium: 8.7 mg/dL — ABNORMAL LOW (ref 8.9–10.3)
Chloride: 95 mmol/L — ABNORMAL LOW (ref 98–111)
Creatinine, Ser: 1.23 mg/dL (ref 0.61–1.24)
GFR, Estimated: >60 mL/min (ref >60)
Glucose, Bld: 118 mg/dL — ABNORMAL HIGH (ref 70–99)
Potassium: 3.7 mmol/L (ref 3.5–5.1)
Sodium: 128 mmol/L — ABNORMAL LOW (ref 135–145)
Total Bilirubin: 1.2 mg/dL (ref 0.3–1.2)
Total Protein: 7.5 g/dL (ref 6.5–8.1)

## 2021-12-19 LAB — CBC WITH DIFFERENTIAL/PLATELET
Abs Immature Granulocytes: 0.02 10*3/uL (ref 0.00–0.07)
Basophils Absolute: 0 10*3/uL (ref 0.0–0.1)
Basophils Relative: 0 %
Eosinophils Absolute: 0 10*3/uL (ref 0.0–0.5)
Eosinophils Relative: 1 %
HCT: 36.5 % — ABNORMAL LOW (ref 39.0–52.0)
Hemoglobin: 11.9 g/dL — ABNORMAL LOW (ref 13.0–17.0)
Immature Granulocytes: 0 %
Lymphocytes Relative: 15 %
Lymphs Abs: 0.9 10*3/uL (ref 0.7–4.0)
MCH: 29.2 pg (ref 26.0–34.0)
MCHC: 32.6 g/dL (ref 30.0–36.0)
MCV: 89.5 fL (ref 80.0–100.0)
Monocytes Absolute: 0.4 10*3/uL (ref 0.1–1.0)
Monocytes Relative: 6 %
Neutro Abs: 4.7 10*3/uL (ref 1.7–7.7)
Neutrophils Relative %: 78 %
Platelets: 203 10*3/uL (ref 150–400)
RBC: 4.08 MIL/uL — ABNORMAL LOW (ref 4.22–5.81)
RDW: 12.7 % (ref 11.5–15.5)
WBC: 6 10*3/uL (ref 4.0–10.5)
nRBC: 0 % (ref 0.0–0.2)

## 2021-12-19 LAB — LIPASE, BLOOD: Lipase: 38 U/L (ref 11–51)

## 2021-12-19 LAB — BRAIN NATRIURETIC PEPTIDE: B Natriuretic Peptide: 110.4 pg/mL — ABNORMAL HIGH (ref 0.0–100.0)

## 2021-12-19 LAB — TROPONIN I (HIGH SENSITIVITY)
Troponin I (High Sensitivity): 26 ng/L — ABNORMAL HIGH (ref <18)
Troponin I (High Sensitivity): 3 ng/L (ref <18)

## 2021-12-19 MED ORDER — HYDROCODONE-ACETAMINOPHEN 5-325 MG PO TABS
1.0000 | ORAL_TABLET | ORAL | Status: AC
  Administered 2021-12-19: 1 via ORAL
  Filled 2021-12-19: qty 1

## 2021-12-19 MED ORDER — ONDANSETRON 4 MG PO TBDP
4.0000 mg | ORAL_TABLET | Freq: Once | ORAL | Status: AC
  Administered 2021-12-19: 4 mg via ORAL
  Filled 2021-12-19: qty 1

## 2021-12-19 MED ORDER — IOHEXOL 350 MG/ML SOLN
100.0000 mL | Freq: Once | INTRAVENOUS | Status: AC | PRN
  Administered 2021-12-19: 100 mL via INTRAVENOUS

## 2021-12-19 MED ORDER — LORAZEPAM 2 MG/ML IJ SOLN
1.0000 mg | Freq: Once | INTRAMUSCULAR | Status: AC
  Administered 2021-12-19: 1 mg via INTRAVENOUS
  Filled 2021-12-19: qty 1

## 2021-12-19 NOTE — ED Provider Triage Note (Signed)
Emergency Medicine Provider Triage Evaluation Note  Daniel [name] , a 31 y.o. male  was evaluated in triage.  Pt complains of severe abdominal pain, low back pain, nausea, chest pain and shortness of breath.  Symptoms been present for several days.  States he recently had ureteral stent placed by urology.  Denies any urinary symptoms.  Pain is moderate to severe and diffuse throughout his abdominal area and lower back.  Review of Systems  Positive: Abdominal pain, back pain, nausea, chest pain, shortness of breath Negative: Fevers, chills  Physical Exam  BP (!) 128/110 (BP Location: Left Arm)   Pulse (!) 105   Temp 98.6 F (37 C) (Oral)   Resp 20   Ht 6\' 5"  (1.956 m)   Wt (!) 181.4 kg   SpO2 95%   BMI 47.43 kg/m  Gen:   Awake, no distress   Resp:  Normal effort  MSK:   Moves extremities without difficulty  Other:    Medical Decision Making  Medically screening exam initiated at 8:01 PM.  Appropriate orders placed.  Daniel [name] was informed that the remainder of the evaluation will be completed by another provider, this initial triage assessment does not replace that evaluation, and the importance of remaining in the ED until their evaluation is complete.     [name], PA-C 12/19/21 2002

## 2021-12-19 NOTE — ED Triage Notes (Signed)
Arrived via POV from Group home. Reports chest pain, abdominal pain and lower back pain. AOX4. Resp even, unlabored on RA. Ambulatory to triage with steady gait.
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
## 2021-12-20 DIAGNOSIS — Z7901 Long term (current) use of anticoagulants: Secondary | ICD-10-CM | POA: Insufficient documentation

## 2021-12-20 DIAGNOSIS — I4891 Unspecified atrial fibrillation: Secondary | ICD-10-CM | POA: Insufficient documentation

## 2021-12-20 DIAGNOSIS — R531 Weakness: Secondary | ICD-10-CM | POA: Insufficient documentation

## 2021-12-20 DIAGNOSIS — D500 Iron deficiency anemia secondary to blood loss (chronic): Secondary | ICD-10-CM | POA: Insufficient documentation

## 2021-12-20 DIAGNOSIS — M545 Low back pain, unspecified: Secondary | ICD-10-CM | POA: Insufficient documentation

## 2021-12-20 DIAGNOSIS — I509 Heart failure, unspecified: Secondary | ICD-10-CM | POA: Insufficient documentation

## 2021-12-20 DIAGNOSIS — R778 Other specified abnormalities of plasma proteins: Secondary | ICD-10-CM

## 2021-12-20 LAB — COMPREHENSIVE METABOLIC PANEL
ALT: 18 U/L (ref 0–44)
AST: 21 U/L (ref 15–41)
Albumin: 4.1 g/dL (ref 3.5–5.0)
Alkaline Phosphatase: 62 U/L (ref 38–126)
Anion gap: 9 (ref 5–15)
BUN: 17 mg/dL (ref 6–20)
CO2: 32 mmol/L (ref 22–32)
Calcium: 8.9 mg/dL (ref 8.9–10.3)
Chloride: 95 mmol/L — ABNORMAL LOW (ref 98–111)
Creatinine, Ser: 1.13 mg/dL (ref 0.61–1.24)
GFR, Estimated: >60 mL/min (ref >60)
Glucose, Bld: 90 mg/dL (ref 70–99)
Potassium: 4.4 mmol/L (ref 3.5–5.1)
Sodium: 136 mmol/L (ref 135–145)
Total Bilirubin: 0.5 mg/dL (ref 0.3–1.2)
Total Protein: 7 g/dL (ref 6.5–8.1)

## 2021-12-20 LAB — CBC WITH DIFFERENTIAL/PLATELET
Abs Immature Granulocytes: 0.15 10*3/uL — ABNORMAL HIGH (ref 0.00–0.07)
Basophils Absolute: 0 10*3/uL (ref 0.0–0.1)
Basophils Relative: 0 %
Eosinophils Absolute: 0 10*3/uL (ref 0.0–0.5)
Eosinophils Relative: 1 %
HCT: 37 % — ABNORMAL LOW (ref 39.0–52.0)
Hemoglobin: 11.9 g/dL — ABNORMAL LOW (ref 13.0–17.0)
Immature Granulocytes: 2 %
Lymphocytes Relative: 21 %
Lymphs Abs: 1.6 10*3/uL (ref 0.7–4.0)
MCH: 29.5 pg (ref 26.0–34.0)
MCHC: 32.2 g/dL (ref 30.0–36.0)
MCV: 91.8 fL (ref 80.0–100.0)
Monocytes Absolute: 0.7 10*3/uL (ref 0.1–1.0)
Monocytes Relative: 9 %
Neutro Abs: 4.9 10*3/uL (ref 1.7–7.7)
Neutrophils Relative %: 67 %
Platelets: 201 10*3/uL (ref 150–400)
RBC: 4.03 MIL/uL — ABNORMAL LOW (ref 4.22–5.81)
RDW: 14 % (ref 11.5–15.5)
WBC: 7.4 10*3/uL (ref 4.0–10.5)
nRBC: 0.3 % — ABNORMAL HIGH (ref 0.0–0.2)

## 2021-12-20 LAB — TROPONIN I (HIGH SENSITIVITY): Troponin I (High Sensitivity): 5 ng/L (ref <18)

## 2021-12-20 LAB — LIPASE, BLOOD: Lipase: 24 U/L (ref 11–51)

## 2021-12-20 LAB — VALPROIC ACID LEVEL: Valproic Acid Lvl: 59 ug/mL (ref 50.0–100.0)

## 2021-12-20 LAB — LITHIUM LEVEL: Lithium Lvl: 0.42 mmol/L — ABNORMAL LOW (ref 0.60–1.20)

## 2021-12-20 LAB — TSH: TSH: 1.976 u[IU]/mL (ref 0.350–4.500)

## 2021-12-20 MED ORDER — DILTIAZEM HCL 60 MG PO TABS
30.0000 mg | ORAL_TABLET | Freq: Once | ORAL | Status: AC
  Administered 2021-12-20: 30 mg via ORAL
  Filled 2021-12-20: qty 1

## 2021-12-20 NOTE — ED Provider Notes (Signed)
Procedures     ----------------------------------------- 2:12 AM on 12/20/2021 ----------------------------------------- A-fib is rate controlled with a rate of 90-100.  Work-up is reassuring and patient is suitable for outpatient follow-up with cardiology.     Sharman Cheek, MD 12/20/21 [phone]
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
## 2021-12-20 NOTE — ED Notes (Signed)
Multiple attempts made to collect labs by multiple staff members without success. Lab contacted at this time for assistance.

## 2021-12-20 NOTE — ED Notes (Signed)
Pt given a warm blanket 

## 2021-12-20 NOTE — Progress Notes (Signed)
   Admission initially requested but patient was subsequently discharged from ED with no criteria for admission  Hospitalist review as follows:  Daniel [name] is a 31 y.o. male Class III obesity (BMI 46), OSA with chronic respiratory failure on home O2 at 4 L, diabetes, A-fib flutter on Eliquis, chronic systolic heart failure cardiomyopathy, EF 45-50% on echo 07/2021 and schizoaffective disorder who presents to the ED with chest pain, abdominal pain and generalized malaise.  He denies cough or shortness of breath, lower extremity pain or swelling and denies nausea, vomiting or diarrhea or dysuria.  Patient has had several visits to the ED for several reasons and was last evaluated by his cardiologist on 7/19 and by his pulmonologist on 6/12.  As of the last cardiology note, he is trying to get approved for [facility]. ED course and data review: Afebrile, tachycardic at 105 with BP 128/110 and O2 sats 93% on home flow rate of 4 L Blood work: Troponin 26>3,  BNP 110.  Sodium 128, usually around 137, lipase and LFTs within normal limits and urinalysis negative. EKG, personally viewed and interpreted: CT abdomen and pelvis nonacute Chest x-ray no signs of pulmonary edema or focal pulmonary consolidation  Discussed with ED provider.  ED provider based on normal troponin and recent cardiology visit decided to discharge patient from the ED

## 2021-12-20 NOTE — ED Notes (Signed)
This rn tried to call the legal guardian [name] 3x no answer , voicemail was left

## 2021-12-20 NOTE — ED Notes (Signed)
Group home arrived and patient escorted to vehicle.

## 2021-12-20 NOTE — ED Notes (Signed)
Pt upset regarding wait and sts he is going to fall in the floor so he can be seen faster.  Pt placed in chair and feet elevated in wheelchair.

## 2021-12-20 NOTE — ED Notes (Signed)
Pt given ice chips w/ PA permission.

## 2021-12-20 NOTE — ED Triage Notes (Signed)
Caregiver reports he needs the following labs to send to the [facility] to see if medication adjustments are needed:  Lithium Valproic Acid CBC w/ Diff TSH Hepatic Function panel

## 2021-12-20 NOTE — ED Triage Notes (Signed)
First RN note: Per EMS, Pt, from group home, presents after multiple falls this morning.  Group Home reported to EMS that falls were intentional.  Pt was seen at ED yesterday for chest, low back, and abdominal pain and was upset that he was discharged.  Pt continues to have same complaints.  Pain score 10/10.    Vitals: 109/65, HR 84

## 2021-12-21 ENCOUNTER — Emergency Department: Payer: Medicaid Other

## 2021-12-21 ENCOUNTER — Emergency Department
Admission: EM | Admit: 2021-12-21 | Discharge: 2021-12-21 | Disposition: A | Discharge status: Home / Self Care | Payer: Medicaid Other | Source: Home / Self Care | Attending: Emergency Medicine | Admitting: Emergency Medicine

## 2021-12-21 DIAGNOSIS — W19XXXA Unspecified fall, initial encounter: Secondary | ICD-10-CM

## 2021-12-21 DIAGNOSIS — Z7901 Long term (current) use of anticoagulants: Secondary | ICD-10-CM

## 2021-12-21 LAB — CK: Total CK: 206 U/L (ref 49–397)

## 2021-12-21 MED ORDER — NAPROXEN 500 MG PO TABS
500.0000 mg | ORAL_TABLET | Freq: Once | ORAL | Status: AC
  Administered 2021-12-21: 500 mg via ORAL
  Filled 2021-12-21: qty 1

## 2021-12-21 MED ORDER — DILTIAZEM HCL 60 MG PO TABS
30.0000 mg | ORAL_TABLET | Freq: Once | ORAL | Status: AC
  Administered 2021-12-21: 30 mg via ORAL
  Filled 2021-12-21: qty 1

## 2021-12-21 MED ORDER — LITHIUM CARBONATE ER 450 MG PO TBCR
450.0000 mg | EXTENDED_RELEASE_TABLET | Freq: Once | ORAL | Status: AC
  Administered 2021-12-21: 450 mg via ORAL
  Filled 2021-12-21: qty 1

## 2021-12-21 MED ORDER — OXYCODONE HCL 5 MG PO TABS
5.0000 mg | ORAL_TABLET | Freq: Once | ORAL | Status: AC
  Administered 2021-12-21: 5 mg via ORAL
  Filled 2021-12-21: qty 1

## 2021-12-21 NOTE — ED Notes (Signed)
Pt assisted to and from bathroom by wheelchair and Clinical research associate. Pt back in bed with nasal cannula in place with 4L oxygen being delivered.

## 2021-12-21 NOTE — Discharge Instructions (Signed)
Daniel [name] lithium level was a little bit low at 0.42, so we gave him an extra dose of lithium 450 mg.  Please continue all the other medications as prescribed.  Continue his lithium regimen as well

## 2021-12-21 NOTE — ED Notes (Signed)
Patient provided with crackers, ginger ale, and warm blanket per request. Resting in bed free from sign of distress. Breathing unlabored speaking in full sentences. Bed low and locked. Side rails raised x2.

## 2021-12-21 NOTE — ED Notes (Signed)
Daniel [name], pts legal guardian contacted by Clinical research associate at this time and provided update on pts ED visit, treatment and discharge plan back to group home. Per Daniel [name], pts group home responsible for discharge transportation. Pt to remain in 18 hall until group home staff arrival to ED for safe discharge.   Number called for legal guardian: [phone]

## 2021-12-21 NOTE — ED Provider Notes (Signed)
[facility] Provider Note    Event Date/Time   First MD Initiated Contact with Patient 12/21/21 0217     (approximate)   History   Fall   HPI  Daniel [name] is a 31 y.o. male who presents to the ED for evaluation of Fall   I reviewed recurrent ED visits.  I am familiar with this patient.  Discharged about 24 hours ago from our facility.  He has a history of atrial fibrillation on Eliquis, CHF, schizoaffective disorder and chronic hypoxic respiratory failure on nasal cannula.  Resides at a local group home.  Patient returns to the ED via EMS from a local drugstore because his legs gave out and he fell.  He questions if his sodium is off or if he needs IV fluids.  He requests medical admission to "get a break from all this medical stuff."  He requests a referral to a pain clinic.  Denies any syncopal episodes.  Reports he still hurts to his chest and chronically to his lower back, for which she was evaluated yesterday.  Physical Exam   Triage Vital Signs: ED Triage Vitals  Enc Vitals Group     BP 12/20/21 1647 94/76     Pulse Rate 12/20/21 1647 (!) 103     Resp 12/20/21 1647 16     Temp 12/20/21 1647 (!) 100.7 F (38.2 C)     Temp Source 12/20/21 1647 Oral     SpO2 12/20/21 1647 93 %     Weight 12/20/21 1746 (!) 400 lb (181.4 kg)     Height 12/20/21 1746 6\' 5"  (1.956 m)     Head Circumference --      Peak Flow --      Pain Score 12/20/21 1746 10     Pain Loc --      Pain Edu? --      Excl. in GC? --     Most recent vital signs: Vitals:   12/21/21 0236 12/21/21 0239  BP: 129/83 129/83  Pulse:  84  Resp:  18  Temp:    SpO2:  92%    General: Awake, no distress.  CV:  Good peripheral perfusion.  Resp:  Normal effort.  Abd:  No distention.  MSK:  No deformity noted.  Neuro:  No focal deficits appreciated. Other:     ED Results / Procedures / Treatments   Labs (all labs ordered are listed, but only abnormal results are  displayed) Labs Reviewed  LITHIUM LEVEL - Abnormal; Notable for the following components:      Result Value   Lithium Lvl 0.42 (*)    All other components within normal limits  COMPREHENSIVE METABOLIC PANEL - Abnormal; Notable for the following components:   Chloride 95 (*)    All other components within normal limits  CBC WITH DIFFERENTIAL/PLATELET - Abnormal; Notable for the following components:   RBC 4.03 (*)    Hemoglobin 11.9 (*)    HCT 37.0 (*)    nRBC 0.3 (*)    Abs Immature Granulocytes 0.15 (*)    All other components within normal limits  VALPROIC ACID LEVEL  TSH  LIPASE, BLOOD  URINALYSIS, ROUTINE W REFLEX MICROSCOPIC  CK  TROPONIN I (HIGH SENSITIVITY)  TROPONIN I (HIGH SENSITIVITY)    EKG A-fib with a rate of 95 bpm.  Normal axis and intervals.  Inferior nonspecific ST changes without STEMI.  RADIOLOGY CT head interpreted by me without evidence of acute intracranial pathology  Official radiology report(s): CT HEAD WO CONTRAST ( )  Result Date: 12/21/2021 CLINICAL DATA:  Multiple falls. EXAM: CT HEAD WITHOUT CONTRAST TECHNIQUE: Contiguous axial images were obtained from the base of the skull through the vertex without intravenous contrast. RADIATION DOSE REDUCTION: This exam was performed according to the departmental dose-optimization program which includes automated exposure control, adjustment of the mA and/or kV according to patient size and/or use of iterative reconstruction technique. COMPARISON:  November 04, 2021 FINDINGS: Brain: No evidence of acute infarction, hemorrhage, hydrocephalus, extra-axial collection or mass lesion/mass effect. Vascular: No hyperdense vessel or unexpected calcification. Skull: Normal. Negative for fracture or focal lesion. Sinuses/Orbits: No acute finding. Other: None. IMPRESSION: No acute intracranial pathology. Electronically Signed   By: Aram Candela M.D.   On: 12/21/2021 03:17    PROCEDURES and INTERVENTIONS:  .1-3 Lead EKG  Interpretation  Performed by: Delton Prairie, MD Authorized by: Delton Prairie, MD     Interpretation: normal     ECG rate:  88   ECG rate assessment: normal     Rhythm: atrial fibrillation     Ectopy: none     Conduction: normal     Medications  lithium carbonate (ESKALITH) CR tablet 450 mg (450 mg Oral Given 12/21/21 0314)  diltiazem (CARDIZEM) tablet 30 mg (30 mg Oral Given 12/21/21 0236)  oxyCODONE (Oxy IR/ROXICODONE) immediate release tablet 5 mg (5 mg Oral Given 12/21/21 0236)  naproxen (NAPROSYN) tablet 500 mg (500 mg Oral Given 12/21/21 0236)     IMPRESSION / MDM / ASSESSMENT AND PLAN / ED COURSE  I reviewed the triage vital signs and the nursing notes.  Differential diagnosis includes, but is not limited to, malingering, intracranial hemorrhage, AKI, medication noncompliance,  31 year old male again returns to the ED for evaluation after recurrent falls and bilateral leg weakness.  Looks systemically well without signs of trauma.  No evidence of neurologic or vascular deficits.  Blood work with chronic and minimal normocytic anemia.  Essentially normal metabolic panel.  Negative troponins and lipase.  Lithium level is low for which she received an extra dose of his home lithium.  Other diagnostics at the request of his facility such as valproic acid are normal.  CT head is clear considering his fall with anticoagulation.  No other concerning features on examination to necessitate further diagnostic imaging.  We will return him to his facility.     FINAL CLINICAL IMPRESSION(S) / ED DIAGNOSES   Final diagnoses:  Fall, initial encounter  Anticoagulated     Rx / DC Orders   ED Discharge Orders     None        Note:  This document was prepared using Dragon voice recognition software and may include unintentional dictation errors.   Delton Prairie, MD 12/21/21 [phone]

## 2021-12-21 NOTE — ED Notes (Signed)
Sela Hilding, pts caregiver at group home called and in route to ED for pts safe discharge.

## 2021-12-21 NOTE — ED Notes (Signed)
Pts oxygen tank changed out to full tank. Pt continued on baseline 4L.

## 2021-12-22 ENCOUNTER — Other Ambulatory Visit: Payer: Self-pay

## 2021-12-22 ENCOUNTER — Emergency Department
Admission: EM | Admit: 2021-12-22 | Discharge: 2021-12-22 | Disposition: A | Discharge status: Home / Self Care | Payer: Medicaid Other | Attending: Emergency Medicine | Admitting: Emergency Medicine

## 2021-12-22 DIAGNOSIS — R002 Palpitations: Secondary | ICD-10-CM | POA: Diagnosis not present

## 2021-12-22 DIAGNOSIS — R109 Unspecified abdominal pain: Secondary | ICD-10-CM | POA: Insufficient documentation

## 2021-12-22 DIAGNOSIS — R300 Dysuria: Secondary | ICD-10-CM | POA: Diagnosis not present

## 2021-12-22 LAB — URINALYSIS, COMPLETE (UACMP) WITH MICROSCOPIC
Bacteria, UA: NONE SEEN
Bilirubin Urine: NEGATIVE
Glucose, UA: NEGATIVE mg/dL
Hgb urine dipstick: NEGATIVE
Ketones, ur: NEGATIVE mg/dL
Nitrite: NEGATIVE
Protein, ur: NEGATIVE mg/dL
Specific Gravity, Urine: 1.015 (ref 1.005–1.030)
pH: 5 (ref 5.0–8.0)

## 2021-12-22 LAB — COMPREHENSIVE METABOLIC PANEL
ALT: 16 U/L (ref 0–44)
AST: 18 U/L (ref 15–41)
Albumin: 3.8 g/dL (ref 3.5–5.0)
Alkaline Phosphatase: 60 U/L (ref 38–126)
Anion gap: 9 (ref 5–15)
BUN: 12 mg/dL (ref 6–20)
CO2: 36 mmol/L — ABNORMAL HIGH (ref 22–32)
Calcium: 8.9 mg/dL (ref 8.9–10.3)
Chloride: 93 mmol/L — ABNORMAL LOW (ref 98–111)
Creatinine, Ser: 1.01 mg/dL (ref 0.61–1.24)
GFR, Estimated: >60 mL/min (ref >60)
Glucose, Bld: 100 mg/dL — ABNORMAL HIGH (ref 70–99)
Potassium: 4 mmol/L (ref 3.5–5.1)
Sodium: 138 mmol/L (ref 135–145)
Total Bilirubin: 0.6 mg/dL (ref 0.3–1.2)
Total Protein: 6.8 g/dL (ref 6.5–8.1)

## 2021-12-22 LAB — CBC
HCT: 39.8 % (ref 39.0–52.0)
Hemoglobin: 12.6 g/dL — ABNORMAL LOW (ref 13.0–17.0)
MCH: 29.5 pg (ref 26.0–34.0)
MCHC: 31.7 g/dL (ref 30.0–36.0)
MCV: 93.2 fL (ref 80.0–100.0)
Platelets: 181 10*3/uL (ref 150–400)
RBC: 4.27 MIL/uL (ref 4.22–5.81)
RDW: 14.1 % (ref 11.5–15.5)
WBC: 8.3 10*3/uL (ref 4.0–10.5)
nRBC: 0 % (ref 0.0–0.2)

## 2021-12-22 LAB — TROPONIN I (HIGH SENSITIVITY): Troponin I (High Sensitivity): 5 ng/L (ref <18)

## 2021-12-22 MED ORDER — ACETAMINOPHEN 500 MG PO TABS
1000.0000 mg | ORAL_TABLET | Freq: Once | ORAL | Status: AC
  Administered 2021-12-22: 1000 mg via ORAL
  Filled 2021-12-22: qty 2

## 2021-12-22 NOTE — ED Triage Notes (Signed)
Pt in with co palpitations that started this am, pt has hx of afib.

## 2021-12-22 NOTE — ED Notes (Signed)
Pt states that he is SOB and is suppose to be on O2. Pt placed on 4L [place].

## 2021-12-22 NOTE — ED Notes (Signed)
This RN made several attempts to contact the patients legal guardian without success.   Pts Caregiver contacted to provide a ride.

## 2021-12-22 NOTE — Discharge Instructions (Signed)
Please seek medical attention for any high fevers, chest pain, shortness of breath, change in behavior, persistent vomiting, bloody stool or any other new or concerning symptoms.  

## 2021-12-22 NOTE — ED Notes (Signed)
E-signature pad unavailable - Pts Caregiver verbalized understanding of D/C information - no additional concerns at this time.

## 2021-12-22 NOTE — ED Provider Triage Note (Signed)
Emergency Medicine Provider Triage Evaluation Note  Daniel [name], a 31 y.o. male  was evaluated in triage.  Pt complains of SOB and palpitations since this morning.  Review of Systems  Positive: SOB, Negative: FCS  Physical Exam  BP 120/78 (BP Location: Left Arm)   Pulse (!) 112   Temp 98.8 F (37.1 C) (Oral)   Resp 20   Ht 6\' 5"  (1.956 m)   Wt (!) 181.4 kg   SpO2 99%   BMI 47.43 kg/m  Gen:   Awake, no distress  NAD Resp:  Normal effort  MSK:   Moves extremities without difficulty  Other:    Medical Decision Making  Medically screening exam initiated at 3:54 PM.  Appropriate orders placed.  [name] was informed that the remainder of the evaluation will be completed by another provider, this initial triage assessment does not replace that evaluation, and the importance of remaining in the ED until their evaluation is complete.  Patient to the ED for evaluation of palpitations and shortness of breath.  Patient is currently on 4 L of O2 per nasal cannula.  He presented in no acute distress mildly tachycardic and with no hypoxia.   [name], PA-C 12/22/21 1559

## 2021-12-22 NOTE — ED Provider Notes (Signed)
[facility] Provider Note    Event Date/Time   First MD Initiated Contact with Patient 12/22/21 1703     (approximate)   History   Abdominal pain   HPI  Willie Plain is a 31 y.o. male whose primary complaint to myself is for abdominal pain.  He states this has been going on for the past few days but is been getting worse.  States it is in his pelvic region.  Says he has had pain with urination.  Pain does go to his back.  The patient did not complain of palpations to myself until I asked directly about them.  He then stated he has also been feeling palpitations.     Physical Exam   Triage Vital Signs: ED Triage Vitals [12/22/21 1350]  Enc Vitals Group     BP 120/78     Pulse Rate (!) 112     Resp 20     Temp 98.8 F (37.1 C)     Temp Source Oral     SpO2 99 %     Weight (!) 400 lb (181.4 kg)     Height 6\' 5"  (1.956 m)     Head Circumference      Peak Flow      Pain Score 10     Pain Loc      Pain Edu?      Excl. in GC?     Most recent vital signs: Vitals:   12/22/21 1350  BP: 120/78  Pulse: (!) 112  Resp: 20  Temp: 98.8 F (37.1 C)  SpO2: 99%    General: Awake, alert, oriented. CV:  Good peripheral perfusion. Irregular rate and rhythm. Resp:  Normal effort. Lungs clear. Abd:  No distention.   ED Results / Procedures / Treatments   Labs (all labs ordered are listed, but only abnormal results are displayed) Labs Reviewed  CBC - Abnormal; Notable for the following components:      Result Value   Hemoglobin 12.6 (*)    All other components within normal limits  COMPREHENSIVE METABOLIC PANEL - Abnormal; Notable for the following components:   Chloride 93 (*)    CO2 36 (*)    Glucose, Bld 100 (*)    All other components within normal limits  TROPONIN I (HIGH SENSITIVITY)  TROPONIN I (HIGH SENSITIVITY)     EKG  I, 12/24/21, attending physician, personally viewed and interpreted this EKG  EKG Time:  1357 Rate: 107 Rhythm: atrial fibrillation Axis: normal Intervals: qtc 459 QRS: narrow ST changes: no st elevation Impression: abnormal ekg  RADIOLOGY None  PROCEDURES:  Critical Care performed: No  Procedures   MEDICATIONS ORDERED IN ED: Medications - No data to display   IMPRESSION / MDM / ASSESSMENT AND PLAN / ED COURSE  I reviewed the triage vital signs and the nursing notes.                              Differential diagnosis includes, but is not limited to, UTI, kidney stone, appendicitis, gastroenteritis.  Patient's presentation is most consistent with acute presentation with potential threat to life or bodily function.  Patient presented to the emergency department today with primary complaint to myself of lower abdominal pain.  To triage nurse however he complained of more palpitations.  EKG does show A-fib.  Patient does have history of A-fib and recent EKGs have also been in A-fib.  Terms of the patient's abdominal pain.  There is no tenderness on exam.  Urine without any white blood cells or red blood cells.  No fever or leukocytosis and blood.  This time I have low concern for significant intra-abdominal infection.  At this time do not feel emergent abdominal imaging is necessary. Will plan on discharging back to facility.  FINAL CLINICAL IMPRESSION(S) / ED DIAGNOSES   Final diagnoses:  Abdominal pain, unspecified abdominal location     Note:  This document was prepared using Dragon voice recognition software and may include unintentional dictation errors.    Phineas Semen, MD 12/22/21 2032

## 2021-12-24 ENCOUNTER — Telehealth: Payer: Self-pay | Admitting: Family

## 2021-12-24 NOTE — Telephone Encounter (Signed)
[name] called, again, inquiring about PA that has been done for his farxiga and entresto. PA's for both medications were completed last week as they were previously ~ 1 month ago. Explained that we had not heard anything from [facility] but because this is a recurrent problem and he's unable to obtain the medication that we would take him off entresto/ farxiga. May need ARB to replace entresto.   When told that, the pharmacy said that they would call Medicaid for [place] and find out the status. Will hold off off removing those medications from his medication list until we hear back from [facility].

## 2021-12-27 ENCOUNTER — Ambulatory Visit: Payer: Medicaid Other | Admitting: Physician Assistant

## 2021-12-30 ENCOUNTER — Ambulatory Visit (INDEPENDENT_AMBULATORY_CARE_PROVIDER_SITE_OTHER): Payer: Medicaid Other | Admitting: Physician Assistant

## 2021-12-30 ENCOUNTER — Emergency Department
Admission: EM | Admit: 2021-12-30 | Discharge: 2021-12-30 | Disposition: A | Discharge status: Home / Self Care | Payer: Medicaid Other | Attending: Emergency Medicine | Admitting: Emergency Medicine

## 2021-12-30 ENCOUNTER — Encounter: Payer: Self-pay | Admitting: Intensive Care

## 2021-12-30 ENCOUNTER — Emergency Department: Payer: Medicaid Other

## 2021-12-30 ENCOUNTER — Other Ambulatory Visit: Payer: Self-pay

## 2021-12-30 VITALS — BP 135/96 | HR 123 | Temp 98.4°F | Ht 77.0 in | Wt 395.0 lb

## 2021-12-30 DIAGNOSIS — I509 Heart failure, unspecified: Secondary | ICD-10-CM | POA: Diagnosis not present

## 2021-12-30 DIAGNOSIS — F603 Borderline personality disorder: Secondary | ICD-10-CM | POA: Diagnosis not present

## 2021-12-30 DIAGNOSIS — S3992XA Unspecified injury of lower back, initial encounter: Secondary | ICD-10-CM | POA: Diagnosis present

## 2021-12-30 DIAGNOSIS — M25562 Pain in left knee: Secondary | ICD-10-CM | POA: Insufficient documentation

## 2021-12-30 DIAGNOSIS — E119 Type 2 diabetes mellitus without complications: Secondary | ICD-10-CM | POA: Insufficient documentation

## 2021-12-30 DIAGNOSIS — M25561 Pain in right knee: Secondary | ICD-10-CM | POA: Insufficient documentation

## 2021-12-30 DIAGNOSIS — N35812 Other urethral bulbous stricture, male: Secondary | ICD-10-CM | POA: Diagnosis not present

## 2021-12-30 DIAGNOSIS — S39012A Strain of muscle, fascia and tendon of lower back, initial encounter: Secondary | ICD-10-CM | POA: Insufficient documentation

## 2021-12-30 DIAGNOSIS — B351 Tinea unguium: Secondary | ICD-10-CM | POA: Insufficient documentation

## 2021-12-30 DIAGNOSIS — I482 Chronic atrial fibrillation, unspecified: Secondary | ICD-10-CM

## 2021-12-30 DIAGNOSIS — X58XXXA Exposure to other specified factors, initial encounter: Secondary | ICD-10-CM | POA: Insufficient documentation

## 2021-12-30 LAB — CBC
HCT: 38.1 % — ABNORMAL LOW (ref 39.0–52.0)
Hemoglobin: 12.4 g/dL — ABNORMAL LOW (ref 13.0–17.0)
MCH: 29.6 pg (ref 26.0–34.0)
MCHC: 32.5 g/dL (ref 30.0–36.0)
MCV: 90.9 fL (ref 80.0–100.0)
Platelets: 200 10*3/uL (ref 150–400)
RBC: 4.19 MIL/uL — ABNORMAL LOW (ref 4.22–5.81)
RDW: 13.8 % (ref 11.5–15.5)
WBC: 6.7 10*3/uL (ref 4.0–10.5)
nRBC: 0 % (ref 0.0–0.2)

## 2021-12-30 LAB — BASIC METABOLIC PANEL
Anion gap: 11 (ref 5–15)
BUN: 13 mg/dL (ref 6–20)
CO2: 25 mmol/L (ref 22–32)
Calcium: 8.7 mg/dL — ABNORMAL LOW (ref 8.9–10.3)
Chloride: 101 mmol/L (ref 98–111)
Creatinine, Ser: 1.16 mg/dL (ref 0.61–1.24)
GFR, Estimated: >60 mL/min (ref >60)
Glucose, Bld: 114 mg/dL — ABNORMAL HIGH (ref 70–99)
Potassium: 4.1 mmol/L (ref 3.5–5.1)
Sodium: 137 mmol/L (ref 135–145)

## 2021-12-30 LAB — TROPONIN I (HIGH SENSITIVITY): Troponin I (High Sensitivity): 3 ng/L (ref <18)

## 2021-12-30 LAB — URINALYSIS, ROUTINE W REFLEX MICROSCOPIC
Bilirubin Urine: NEGATIVE
Glucose, UA: NEGATIVE mg/dL
Hgb urine dipstick: NEGATIVE
Ketones, ur: NEGATIVE mg/dL
Nitrite: NEGATIVE
Protein, ur: 30 mg/dL — AB
Specific Gravity, Urine: 1.021 (ref 1.005–1.030)
pH: 5 (ref 5.0–8.0)

## 2021-12-30 LAB — BLADDER SCAN AMB NON-IMAGING: Scan Result: 2

## 2021-12-30 MED ORDER — DICLOFENAC SODIUM 1 % EX GEL: 2.0000 g | Freq: Four times a day (QID) | CUTANEOUS | 0 refills | Status: DC

## 2021-12-30 MED ORDER — FUROSEMIDE 20 MG PO TABS: 20.0000 mg | ORAL_TABLET | Freq: Every day | ORAL | 0 refills | Status: DC

## 2021-12-30 NOTE — Discharge Instructions (Addendum)
Your chest xray, lab tests, and examination were all okay today. Please follow up with your doctor and your therapist for continued management of your health conditions.  We have provided a new prescription for your Lasix.  You should have refills of your Sherryll Burger and Marcelline Deist available at your pharmacy.

## 2021-12-30 NOTE — ED Provider Notes (Signed)
[facility] Provider Note    Event Date/Time   First MD Initiated Contact with Patient 12/30/21 1701     (approximate)   History   Chief Complaint: Back Pain and Weakness   HPI  Daniel [name] is a 31 y.o. male with a history of morbid obesity, atrial fibrillation, diabetes, schizoaffective disorder, borderline personality disorder with frequent ED presentations who comes to the ED complaining of low back pain as well as bilateral knee pain.  This has been going on for several days.  No specific traumatic event.  No chest pain or shortness of breath.  He is worried that his congestive heart failure may be worsening.  He reports being out of [place] Lasix and Entresto.  He also complains of discoloration and brittleness of toenails.  Review of outside records shows that he should have refills available of these medicines.  He was seen in urology clinic earlier today.  He was seen recently at [facility] ED where he was noted to pursue frequent ED transfers for secondary gain.  Patient request IV placement so that he can be given IV morphine.     Physical Exam   Triage Vital Signs: ED Triage Vitals [12/30/21 1628]  Enc Vitals Group     BP      Pulse      Resp      Temp      Temp src      SpO2      Weight (!) 396 lb (179.6 kg)     Height 6\' 5"  (1.956 m)     Head Circumference      Peak Flow      Pain Score 10     Pain Loc      Pain Edu?      Excl. in GC?     Most recent vital signs: There were no vitals filed for this visit.  General: Awake, no distress.  CV:  Good peripheral perfusion.  Irregular rhythm, rate controlled with a heart rate of about 90.  Normal distal pulses. Resp:  Normal effort.  Clear to auscultation bilaterally Abd:  No distention.  Soft nontender Other:  Symmetric calf circumference, calves nontender, negative Homans' sign.  Bilateral onychomycosis in the great toes and in the right second toe.  There is trace pitting edema  bilateral lower extremities.   ED Results / Procedures / Treatments   Labs (all labs ordered are listed, but only abnormal results are displayed) Labs Reviewed  BASIC METABOLIC PANEL - Abnormal; Notable for the following components:      Result Value   Glucose, Bld 114 (*)    Calcium 8.7 (*)    All other components within normal limits  CBC - Abnormal; Notable for the following components:   RBC 4.19 (*)    Hemoglobin 12.4 (*)    HCT 38.1 (*)    All other components within normal limits  URINALYSIS, ROUTINE W REFLEX MICROSCOPIC - Abnormal; Notable for the following components:   Color, Urine YELLOW (*)    APPearance HAZY (*)    Protein, ur 30 (*)    Leukocytes,Ua TRACE (*)    Bacteria, UA RARE (*)    All other components within normal limits  TROPONIN I (HIGH SENSITIVITY)     EKG Interpreted by me Atrial fibrillation, rate of 98.  Normal axis and intervals, normal QRS ST segments and T waves.   RADIOLOGY Chest x-ray interpreted by me, negative for edema effusion or consolidation.  Radiology report reviewed.   PROCEDURES:  Procedures   MEDICATIONS ORDERED IN ED: Medications - No data to display   IMPRESSION / MDM / ASSESSMENT AND PLAN / ED COURSE  I reviewed the triage vital signs and the nursing notes.                              Differential diagnosis includes, but is not limited to, malingering, anxiety, pleural effusion, pulmonary edema, decompensated CHF AKI, electrolyte abnormality  Patient's presentation is most consistent with exacerbation of chronic illness.  Patient complains of low back pain and bilateral knee pain which all appear to be musculoskeletal.  I think he has lumbar strain as well as bilateral knee osteoarthritis, both likely substantially related to his morbid obesity.  Patient appears to be at his chronic baseline.  No sign of ACS PE dissection CHF/volume overload, renal failure.  Labs chest x-ray are all okay.  Reviewing his  prescription history he should have refills for his medications available at the pharmacy.  I will provide a prescription for Lasix just in case as well as for Voltaren gel for his lower back.  Recommend follow-up with podiatry for his onychomycosis.       FINAL CLINICAL IMPRESSION(S) / ED DIAGNOSES   Final diagnoses:  Strain of lumbar region, initial encounter  Borderline personality disorder (HCC)  Onychomycosis     Rx / DC Orders   ED Discharge Orders          Ordered    diclofenac Sodium (VOLTAREN) 1 % GEL  4 times daily        12/30/21 1737             Note:  This document was prepared using Dragon voice recognition software and may include unintentional dictation errors.   Sharman Cheek, MD 12/30/21 [phone]

## 2021-12-30 NOTE — ED Notes (Signed)
Pt. To ED stretcher, given extension tubing for O2 as requested. Pt. Speaking full sentences, NAD, no apparent SOB.

## 2021-12-30 NOTE — Progress Notes (Signed)
12/30/2021 3:05 PM   [name] 06/05/1990 [id number]  CC: Chief Complaint  Patient presents with   Other stricture of bulbous urethra in male   HPI: Daniel [name] is a 31 y.o. male with PMH bulbar urethral stricture s/p dilation by Dr. Lonna Cobb on 09/07/2021, chronic A-fib on Eliquis, CHF, schizoaffective disorder, diabetes, and conversion disorder with frequent ED visits who presents today for follow-up. He is accompanied today by a caregiver from his group home.  Today he reports dysuria and severe pain in his back as well as throughout his body. He is tearful and states he wants to go to the ED so he can see a therapist who will talk to him. He reports depression and ongoing grief following his father's death 5 years ago.  He has been seen in the emergency department twice recently with dysuria on 11/19/2021 and abdominal pain on 12/22/2021.  The first time, UA was notable for pyuria, microscopic hematuria, and squamous epithelial cells and urine culture grew multiple species.  He was treated with Rocephin and cefadroxil.  The second time, UA was bland.  CT AP with contrast dated 12/19/2021 with no significant urinary findings including no nephrolithiasis, hydronephrosis, or urinary retention.  PVR 42mL.  PMH: Past Medical History:  Diagnosis Date   Atrial fibrillation (HCC)    Cardiomyopathy (HCC)    a. 07/2021 Echo: EF 50%, mild LVH, nl RV size/function. No significant valvular disease.   Chest pain    CHF (congestive heart failure) (HCC)    Conversion disorder    Current use of long term anticoagulation    Diabetes mellitus without complication (HCC)    Hyperlipidemia    Hypertension    OSA (obstructive sleep apnea)    Persistent atrial fibrillation and flutter (HCC)    a. CHA2DS2VASc = 3-4   Schizoaffective disorder (HCC)    Seizure disorder (HCC)    TIA (transient ischemic attack)    Urethral stricture    a. 08/2021 s/p cystoscopy and urethral dilation.     Surgical History: Past Surgical History:  Procedure Laterality Date   CYSTOSCOPY WITH URETHRAL DILATATION N/A 09/07/2021   Procedure: CYSTOSCOPY WITH URETHRAL DILATATION CATHETER PLACEMENT;  Surgeon: Riki Altes, MD;  Location: [facility] ORS;  Service: Urology;  Laterality: N/A;    Home Medications:  Allergies as of 12/30/2021       Reactions   Haldol [haloperidol] Other (See Comments)   SI   Dermatitis Antigen Rash   Meperidine    Meperidine   Abilify [aripiprazole] Palpitations   Demerol [meperidine Hcl] Hives   Tape Rash   Other reaction(s): Other (See Comments) Itching, skin tear        Medication List        Accurate as of December 30, 2021  3:05 PM. If you have any questions, ask your nurse or doctor.          albuterol 108 (90 Base) MCG/ACT inhaler Commonly known as: VENTOLIN HFA Inhale 2 puffs into the lungs every 6 (six) hours as needed for wheezing.   allopurinol 300 MG tablet Commonly known as: ZYLOPRIM Take 1 tablet (300 mg total) by mouth daily.   aspirin 325 MG tablet Take 325 mg by mouth daily.   atorvastatin 80 MG tablet Commonly known as: LIPITOR Take 80 mg by mouth daily.   baclofen 10 MG tablet Commonly known as: LIORESAL Take 1 tablet (10 mg total) by mouth 2 (two) times daily as needed for muscle spasms. Home med.  cyclobenzaprine 5 MG tablet Commonly known as: FLEXERIL Take 5 mg by mouth 3 (three) times daily as needed for muscle spasms.   dapagliflozin propanediol 10 MG Tabs tablet Commonly known as: Farxiga Take 1 tablet (10 mg total) by mouth daily before breakfast.   diltiazem 360 MG 24 hr capsule Commonly known as: CARDIZEM CD Take 1 capsule (360 mg total) by mouth daily.   divalproex 500 MG DR tablet Commonly known as: DEPAKOTE Take 2 tablets (1,000 mg total) by mouth 2 (two) times daily.   Eliquis 5 MG Tabs tablet Generic drug: apixaban Take 5 mg by mouth 2 (two) times daily.   escitalopram 10 MG tablet Commonly  known as: LEXAPRO Take 20 mg by mouth daily.   fluticasone 50 MCG/ACT nasal spray Commonly known as: FLONASE Place 2 sprays into both nostrils 2 (two) times daily.   furosemide 20 MG tablet Commonly known as: LASIX Take 20 mg by mouth daily.   HYDROcodone-acetaminophen 5-325 MG tablet Commonly known as: NORCO/VICODIN Take 1 tablet by mouth every 4 (four) hours as needed.   hydrOXYzine 50 MG tablet Commonly known as: ATARAX Take 50 mg by mouth in the morning and at bedtime.   hydrOXYzine 25 MG tablet Commonly known as: ATARAX Take 25 mg by mouth daily. At noon   ibuprofen 800 MG tablet Commonly known as: ADVIL Take 800 mg by mouth every 8 (eight) hours as needed.   Invega 9 MG 24 hr tablet Generic drug: paliperidone Take 9 mg by mouth every morning.   lithium carbonate 300 MG CR tablet Commonly known as: LITHOBID Take 300 mg by mouth 2 (two) times daily.   loratadine 10 MG tablet Commonly known as: CLARITIN Take 10 mg by mouth daily.   melatonin 3 MG Tabs tablet Take 3 mg by mouth at bedtime.   metoprolol tartrate 50 MG tablet Commonly known as: LOPRESSOR Take 50 mg by mouth 2 (two) times daily.   naproxen 500 MG tablet Commonly known as: Naprosyn Take 1 tablet (500 mg total) by mouth 2 (two) times daily with a meal.   omeprazole 20 MG capsule Commonly known as: PRILOSEC Take 20 mg by mouth daily.   potassium chloride SA 20 MEQ tablet Commonly known as: KLOR-CON M Take 1 tablet (20 mEq total) by mouth daily for 7 days.   promethazine 25 MG tablet Commonly known as: PHENERGAN Take 25 mg by mouth every 6 (six) hours as needed for nausea or vomiting.   sacubitril-valsartan 24-26 MG Commonly known as: ENTRESTO Take 1 tablet by mouth 2 (two) times daily.   tamsulosin 0.4 MG Caps capsule Commonly known as: FLOMAX Take 1 capsule (0.4 mg total) by mouth daily.   traMADol 50 MG tablet Commonly known as: ULTRAM Take 50 mg by mouth every 6 (six) hours as  needed.        Allergies:  Allergies  Allergen Reactions   Haldol [Haloperidol] Other (See Comments)    SI   Dermatitis Antigen Rash   Meperidine     Meperidine   Abilify [Aripiprazole] Palpitations   Demerol [Meperidine Hcl] Hives   Tape Rash    Other reaction(s): Other (See Comments) Itching, skin tear    Family History: No family history on file.  Social History:   reports that he has quit smoking. His smoking use included cigarettes. He has never used smokeless tobacco. He reports that he does not currently use alcohol. He reports that he does not use drugs.  Physical Exam: BP [name])  135/96   Pulse (!) 123   Temp 98.4 F (36.9 C)   Ht 6\' 5"  (1.956 m)   Wt (!) 395 lb (179.2 kg)   BMI 46.84 kg/m   Constitutional:  Alert and oriented, no acute distress, nontoxic appearing HEENT: [place], AT Cardiovascular: No clubbing, cyanosis, or edema Respiratory: Normal respiratory effort, no increased work of breathing, on supplemental oxygen Skin: No rashes, bruises or suspicious lesions Neurologic: Grossly intact, no focal deficits, moving all 4 extremities Psychiatric: Depressed mood and tearful affect  Laboratory Data: Results for orders placed or performed in visit on 12/30/21  BLADDER SCAN AMB NON-IMAGING  Result Value Ref Range   Scan Result 2 ml    Assessment & Plan:   1. Other stricture of bulbous urethra in male PVR WNL. Recent ED UA unremarkable. Recent CT with no significant findings. I offered repeat UA today, which he declined. We discussed that he is doing well from the urologic perspective. I have low suspicion for infection at this time.  His other complaints today are outside the scope of urology. Will defer to his caregiver to coordinate possible ED visit. - BLADDER SCAN AMB NON-IMAGING  Return in about 1 year (around 12/31/2022) for Stricture follow-up with UA and PVR.  03/02/2023, PA-C  [facility] [address]  [address] [place], [place] [phone]

## 2021-12-30 NOTE — ED Triage Notes (Signed)
Patient c/o weakness and lower back pain. Wears 4L O2 continuous. Reports he is out of his CHF meds but unsure the names of them. Also c/o cough X1 week

## 2022-01-11 ENCOUNTER — Encounter: Payer: Self-pay | Admitting: Intensive Care

## 2022-01-11 ENCOUNTER — Other Ambulatory Visit: Payer: Self-pay

## 2022-01-11 ENCOUNTER — Emergency Department: Payer: Medicaid Other

## 2022-01-11 ENCOUNTER — Emergency Department
Admission: EM | Admit: 2022-01-11 | Discharge: 2022-01-11 | Disposition: A | Discharge status: Home / Self Care | Payer: Medicaid Other | Attending: Emergency Medicine | Admitting: Emergency Medicine

## 2022-01-11 DIAGNOSIS — I4891 Unspecified atrial fibrillation: Secondary | ICD-10-CM | POA: Insufficient documentation

## 2022-01-11 DIAGNOSIS — R531 Weakness: Secondary | ICD-10-CM | POA: Diagnosis present

## 2022-01-11 LAB — TROPONIN I (HIGH SENSITIVITY)
Troponin I (High Sensitivity): 4 ng/L (ref <18)
Troponin I (High Sensitivity): 3 ng/L (ref <18)

## 2022-01-11 LAB — CBC
HCT: 43.5 % (ref 39.0–52.0)
Hemoglobin: 14.1 g/dL (ref 13.0–17.0)
MCH: 29.5 pg (ref 26.0–34.0)
MCHC: 32.4 g/dL (ref 30.0–36.0)
MCV: 91 fL (ref 80.0–100.0)
Platelets: 198 10*3/uL (ref 150–400)
RBC: 4.78 MIL/uL (ref 4.22–5.81)
RDW: 13.5 % (ref 11.5–15.5)
WBC: 5.5 10*3/uL (ref 4.0–10.5)
nRBC: 0 % (ref 0.0–0.2)

## 2022-01-11 LAB — BASIC METABOLIC PANEL
Anion gap: 8 (ref 5–15)
BUN: 14 mg/dL (ref 6–20)
CO2: 30 mmol/L (ref 22–32)
Calcium: 9 mg/dL (ref 8.9–10.3)
Chloride: 98 mmol/L (ref 98–111)
Creatinine, Ser: 1.05 mg/dL (ref 0.61–1.24)
GFR, Estimated: >60 mL/min (ref >60)
Glucose, Bld: 181 mg/dL — ABNORMAL HIGH (ref 70–99)
Potassium: 4.5 mmol/L (ref 3.5–5.1)
Sodium: 136 mmol/L (ref 135–145)

## 2022-01-11 LAB — BRAIN NATRIURETIC PEPTIDE: B Natriuretic Peptide: 125.3 pg/mL — ABNORMAL HIGH (ref 0.0–100.0)

## 2022-01-11 LAB — PROTIME-INR
INR: 1.1 (ref 0.8–1.2)
Prothrombin Time: 14 seconds (ref 11.4–15.2)

## 2022-01-11 MED ORDER — FUROSEMIDE 10 MG/ML IJ SOLN
60.0000 mg | Freq: Once | INTRAMUSCULAR | Status: AC
  Administered 2022-01-11: 60 mg via INTRAVENOUS
  Filled 2022-01-11: qty 8

## 2022-01-11 MED ORDER — ACETAMINOPHEN 500 MG PO TABS
1000.0000 mg | ORAL_TABLET | Freq: Once | ORAL | Status: AC
  Administered 2022-01-11: 1000 mg via ORAL
  Filled 2022-01-11: qty 2

## 2022-01-11 MED ORDER — DILTIAZEM HCL 25 MG/5ML IV SOLN
15.0000 mg | Freq: Once | INTRAVENOUS | Status: AC
Start: 2022-01-11 — End: 2022-01-11
  Administered 2022-01-11: 15 mg via INTRAVENOUS
  Filled 2022-01-11: qty 5

## 2022-01-11 NOTE — ED Notes (Signed)
This RN attempted IV placement x2. First was able to draw blood but could not advance into place. Cicero Duck RN also attempted. IV team consult to be placed for medication administration

## 2022-01-11 NOTE — ED Provider Notes (Addendum)
[facility] Provider Note    Event Date/Time   First MD Initiated Contact with Patient 01/11/22 1225     (approximate)   History   Rectal Bleeding, Shortness of Breath, and Chest Pain   HPI  Daniel [name] is a 31 y.o. male  who presents to the emergency department today because of concern for feeling bad. The patient states that he is concerned that he is fluid overloaded. Says that he went to his heart failure clinic last week and was told everything was okay. Since then he feels like he has become swollen and has felt weak. Denies any fevers but says he has chills.       Physical Exam   Triage Vital Signs: ED Triage Vitals  Enc Vitals Group     BP 01/11/22 1112 (!) 126/53     Pulse Rate 01/11/22 1112 66     Resp 01/11/22 1112 (!) 24     Temp 01/11/22 1112 98.4 F (36.9 C)     Temp Source 01/11/22 1112 Oral     SpO2 01/11/22 1112 95 %     Weight 01/11/22 1118 (!) 394 lb 10 oz (179 kg)     Height 01/11/22 1118 6\' 5"  (1.956 m)     Head Circumference --      Peak Flow --      Pain Score 01/11/22 1118 10     Pain Loc --      Pain Edu? --      Excl. in GC? --     Most recent vital signs: Vitals:   01/11/22 1130 01/11/22 1200  BP: 119/70 117/74  Pulse: 99 (!) 102  Resp: 17 (!) 25  Temp:    SpO2: 94% 95%    General: Awake, alert, oriented. CV:  Good peripheral perfusion. Tachycardia, irregular irregular rhythm. Resp:  Normal effort. Lungs clear. Abd:  No distention.     ED Results / Procedures / Treatments   Labs (all labs ordered are listed, but only abnormal results are displayed) Labs Reviewed  BASIC METABOLIC PANEL - Abnormal; Notable for the following components:      Result Value   Glucose, Bld 181 (*)    All other components within normal limits  BRAIN NATRIURETIC PEPTIDE - Abnormal; Notable for the following components:   B Natriuretic Peptide 125.3 (*)    All other components within normal limits  CBC  PROTIME-INR   TROPONIN I (HIGH SENSITIVITY)  TROPONIN I (HIGH SENSITIVITY)     EKG  I, 01/13/22, attending physician, personally viewed and interpreted this EKG  EKG Time: 1113 Rate: 148 Rhythm: atrial fibrillation with RVR Axis: normal Intervals: qtc 467 QRS: narrow, q waves III ST changes: no st elevation Impression: abnormal ekg  RADIOLOGY I independently interpreted and visualized the CXR. My interpretation: No pneumonia. No pneumothorax.  Radiology interpretation:  IMPRESSION:  No active cardiopulmonary disease.      PROCEDURES:  Critical Care performed: No  Procedures   MEDICATIONS ORDERED IN ED: Medications - No data to display   IMPRESSION / MDM / ASSESSMENT AND PLAN / ED COURSE  I reviewed the triage vital signs and the nursing notes.                              Differential diagnosis includes, but is not limited to, atrial fibrillation with RVR, gi bleed, ACS, electrolyte abnormality.  Patient's presentation is most consistent  with acute presentation with potential threat to life or bodily function.  Patient presented to the emergency department today because of concerns for feeling bad, chills, GI bleed.  Patient was initially found to be in A-fib with RVR.  This did improve significantly after diltiazem.  Blood work here without concerning troponin elevation or leukocytosis to suggest infection.  Hemoglobin is normal.  Given reassuring blood work and improvement of patient's heart rate I do think is reasonable for patient be discharged home.  Will have patient follow-up with primary care.   FINAL CLINICAL IMPRESSION(S) / ED DIAGNOSES   Final diagnoses:  Atrial fibrillation, unspecified type [facility])      Note:  This document was prepared using Dragon voice recognition software and may include unintentional dictation errors.    Phineas Semen, MD 01/11/22 1520    Phineas Semen, MD 01/11/22 1525

## 2022-01-11 NOTE — ED Provider Triage Note (Signed)
Emergency Medicine Provider Triage Evaluation Note  Daniel [name] , a 31 y.o. male  was evaluated in triage.  Pt complains of not feeling well.  Rectal bleeding.  Elevated heart rate..  Review of Systems  Positive: Rapid heart rate, rate related Negative: Vomiting  Physical Exam  There were no vitals taken for this visit. Gen:   Awake, no distress   Resp:  Normal effort  MSK:   Moves extremities without difficulty  Other:    Medical Decision Making  Medically screening exam initiated at 11:17 AM.  Appropriate orders placed.  Daniel [name] was informed that the remainder of the evaluation will be completed by another provider, this initial triage assessment does not replace that evaluation, and the importance of remaining in the ED until their evaluation is complete.  EKG shows rapid A-fib.   Faythe Ghee, PA-C 01/11/22 1123

## 2022-01-11 NOTE — Discharge Instructions (Signed)
Please seek medical attention for any high fevers, chest pain, shortness of breath, change in behavior, persistent vomiting, bloody stool or any other new or concerning symptoms.  

## 2022-01-11 NOTE — ED Triage Notes (Signed)
Patient c/o sob, rectal bleeding, and chest pain that started last night.   Patient reports his group home keeps his oxygen from him even though he is suppose to wear it all the time. Reports 4L O2 continuous. Patient also states there was no one at the home to call EMS last night so he went back to sleep until waking up this AM and staff showed up to take him to hospital.

## 2022-01-24 ENCOUNTER — Other Ambulatory Visit: Payer: Self-pay

## 2022-01-24 ENCOUNTER — Emergency Department: Payer: Medicaid Other

## 2022-01-24 ENCOUNTER — Encounter: Payer: Self-pay | Admitting: Emergency Medicine

## 2022-01-24 ENCOUNTER — Emergency Department
Admission: EM | Admit: 2022-01-24 | Discharge: 2022-01-24 | Disposition: A | Discharge status: Home / Self Care | Payer: Medicaid Other | Attending: Emergency Medicine | Admitting: Emergency Medicine

## 2022-01-24 DIAGNOSIS — I509 Heart failure, unspecified: Secondary | ICD-10-CM | POA: Diagnosis not present

## 2022-01-24 DIAGNOSIS — R7309 Other abnormal glucose: Secondary | ICD-10-CM | POA: Diagnosis not present

## 2022-01-24 DIAGNOSIS — I4891 Unspecified atrial fibrillation: Secondary | ICD-10-CM | POA: Diagnosis not present

## 2022-01-24 DIAGNOSIS — R002 Palpitations: Secondary | ICD-10-CM | POA: Diagnosis present

## 2022-01-24 LAB — CBC
HCT: 41.3 % (ref 39.0–52.0)
Hemoglobin: 13.3 g/dL (ref 13.0–17.0)
MCH: 29.7 pg (ref 26.0–34.0)
MCHC: 32.2 g/dL (ref 30.0–36.0)
MCV: 92.2 fL (ref 80.0–100.0)
Platelets: 213 10*3/uL (ref 150–400)
RBC: 4.48 MIL/uL (ref 4.22–5.81)
RDW: 13.3 % (ref 11.5–15.5)
WBC: 5.3 10*3/uL (ref 4.0–10.5)
nRBC: 0 % (ref 0.0–0.2)

## 2022-01-24 LAB — COMPREHENSIVE METABOLIC PANEL
ALT: 21 U/L (ref 0–44)
AST: 26 U/L (ref 15–41)
Albumin: 3.9 g/dL (ref 3.5–5.0)
Alkaline Phosphatase: 70 U/L (ref 38–126)
Anion gap: 12 (ref 5–15)
BUN: 11 mg/dL (ref 6–20)
CO2: 27 mmol/L (ref 22–32)
Calcium: 9.1 mg/dL (ref 8.9–10.3)
Chloride: 98 mmol/L (ref 98–111)
Creatinine, Ser: 0.92 mg/dL (ref 0.61–1.24)
GFR, Estimated: >60 mL/min (ref >60)
Glucose, Bld: 169 mg/dL — ABNORMAL HIGH (ref 70–99)
Potassium: 4 mmol/L (ref 3.5–5.1)
Sodium: 137 mmol/L (ref 135–145)
Total Bilirubin: 0.7 mg/dL (ref 0.3–1.2)
Total Protein: 7.2 g/dL (ref 6.5–8.1)

## 2022-01-24 LAB — TROPONIN I (HIGH SENSITIVITY)
Troponin I (High Sensitivity): 3 ng/L (ref <18)
Troponin I (High Sensitivity): 3 ng/L (ref <18)

## 2022-01-24 MED ORDER — DILTIAZEM HCL 25 MG/5ML IV SOLN: 15.0000 mg | Freq: Once | INTRAVENOUS | Status: DC

## 2022-01-24 MED ORDER — ACETAMINOPHEN 500 MG PO TABS
1000.0000 mg | ORAL_TABLET | Freq: Once | ORAL | Status: AC
  Administered 2022-01-24: 1000 mg via ORAL
  Filled 2022-01-24: qty 2

## 2022-01-24 NOTE — ED Provider Triage Note (Signed)
Emergency Medicine Provider Triage Evaluation Note  Daniel [name] , a 31 y.o. male  was evaluated in triage.  Pt complains of back pain and feels like heart is fluttering since last night. Hx of A fib and O2 use.   Review of Systems  Positive: Dizziness, lightheaded Negative: No CP.  Physical Exam  BP 125/85 (BP Location: Left Arm)   Pulse 65   Temp 98.5 F (36.9 C) (Oral)   Resp 18   Ht 6\' 5"  (1.956 m)   Wt (!) 178.7 kg   SpO2 97%   BMI 46.72 kg/m  Gen:   Awake, no distress  Talkative Resp:  Normal effort  Lungs Clear MSK:   Moves extremities without difficulty  Other:  Heart irreg ireg.  Medical Decision Making  Medically screening exam initiated at 10:55 AM.  Appropriate orders placed.  Daniel [name] was informed that the remainder of the evaluation will be completed by another provider, this initial triage assessment does not replace that evaluation, and the importance of remaining in the ED until their evaluation is complete.     [name], PA-C 01/24/22 1057

## 2022-01-24 NOTE — ED Provider Notes (Signed)
[facility] Provider Note    Event Date/Time   First MD Initiated Contact with Patient 01/24/22 1516     (approximate)   History   Palpitations   HPI  Daniel [name] is a 31 y.o. male   Past medical history of fibrillation on Eliquis and diltiazem, OSA, diabetes, CHF, neurolyse anxiety disorder, schizoaffective, TIAs, seizure, elevated BMI, presents with sensation of fluttering in the chest, consistent with his atrial fibrillation with RVR experienced in the past.  He was recently seen in the emergency department 2 weeks ago for the same.  He states that he has been on all of his medications as prescribed.  He is on home O2 4 L at baseline, no increase in O2 requirements and he does not feel short of breath.  He has some chest discomfort along with the fluttering sensation but denies frank chest pain.  Denies cough or fever.  No other acute medical complaints.  History was obtained via patient and review of external medical notes      Physical Exam   Triage Vital Signs: ED Triage Vitals  Enc Vitals Group     BP 01/24/22 1052 125/85     Pulse Rate 01/24/22 1052 65     Resp 01/24/22 1052 18     Temp 01/24/22 1052 98.5 F (36.9 C)     Temp Source 01/24/22 1052 Oral     SpO2 01/24/22 1052 97 %     Weight 01/24/22 1050 (!) 394 lb (178.7 kg)     Height 01/24/22 1050 6\' 5"  (1.956 m)     Head Circumference --      Peak Flow --      Pain Score 01/24/22 1050 8     Pain Loc --      Pain Edu? --      Excl. in GC? --     Most recent vital signs: Vitals:   01/24/22 1550 01/24/22 1605  BP:  131/79  Pulse:  91  Resp: 16 17  Temp:    SpO2:  100%    General: Awake, no distress.  4 L nasal cannula at baseline. CV:  Good peripheral perfusion.  Heart sounds irregular and tachycardic. Resp:  Normal effort.  Lungs without significant rales or rhonchi, distant given body habitus. Abd:  No distention.  Soft and nontender Other:  Alert, oriented,  conversant and pleasant.  Appears comfortable.   ED Results / Procedures / Treatments   Labs (all labs ordered are listed, but only abnormal results are displayed) Labs Reviewed  COMPREHENSIVE METABOLIC PANEL - Abnormal; Notable for the following components:      Result Value   Glucose, Bld 169 (*)    All other components within normal limits  CBC  TROPONIN I (HIGH SENSITIVITY)  TROPONIN I (HIGH SENSITIVITY)     I reviewed labs and they are notable for troponin is 3.  Electrolytes within normal limits, although glucose is slightly elevated 160.  EKG  ED ECG REPORT I, 01/26/22, the attending physician, personally viewed and interpreted this ECG.   Date: 01/24/2022  EKG Time: 311P  Rate: 137  Rhythm: normal EKG, normal sinus rhythm, unchanged from previous tracings, atrial fibrillation, rate 130s  Axis: normal  Intervals:none  ST&T Change: no stemi    RADIOLOGY I dependently reviewed and interpreted CXR and see no obvious focal opacities or pneumothorax.    Please refer to radiology's final report   PROCEDURES:  Critical Care performed: No  Procedures   MEDICATIONS ORDERED IN ED: Medications  acetaminophen (TYLENOL) tablet 1,000 mg (1,000 mg Oral Given 01/24/22 1544)    IMPRESSION / MDM / ASSESSMENT AND PLAN / ED COURSE  I reviewed the triage vital signs and the nursing notes.                              Differential diagnosis includes, but is not limited to, atrial fibrillation with rapid ventricular response, check electrolytes for electrolyte derangement, dehydration, infection, ACS, PE.   The patient is on the cardiac monitor to evaluate for evidence of arrhythmia and/or significant heart rate changes.  MDM: Atrial fibrillation with rapid ventricular response with normal blood pressure.  Screening for embolus including infection, hydration status, ACS, other inciting events largely unremarkable.  Patient with no infectious symptoms and clear sounding  lungs, nontoxic appearance with a benign exam.  Compliant with medications.  Will check troponins, chest x-ray, electrolytes. EKG is nonischemic, patient with chest discomfort more likely due to his RVR rather than ischemia.  Initial troponin is negative.  Considered PE but unlikely given atypical symptoms and patient is compliant with his Eliquis.  Electrolytes within normal limits  His last visit with A-fib RVR he had good rate control with 15 mg of IV diltiazem, we will try the same today.  We can achieve adequate control with IV, will follow with p.o. diltiazem and anticipate discharge.  Where he received any medications from [place], he was found to be in a normal rate 80 to 90s atrial fibrillation with normal blood pressure.  We will continue to watch him for well over 1 hour with rates unchanged, hemodynamics stable, and work-up as above has been normal with troponins 3 and 3.  He has some chronic complaints that have been ongoing for quite some time including numbness and tingling to bilateral hands and feet and back pain that is unchanged from prior.  I advised this patient to follow-up with his primary doctor to continue work-up for the above.  I notified both his legal guardian and his caretaker of this plan, and plan for discharge at this time.   Patient's presentation is most consistent with acute presentation with potential threat to life or bodily function.       FINAL CLINICAL IMPRESSION(S) / ED DIAGNOSES   Final diagnoses:  Atrial fibrillation with rapid ventricular response (HCC)     Rx / DC Orders   ED Discharge Orders     None        Note:  This document was prepared using Dragon voice recognition software and may include unintentional dictation errors.    Pilar Jarvis, MD 01/24/22 [phone]

## 2022-01-24 NOTE — ED Triage Notes (Signed)
Pt here states he has had some back pain and felt like heart was fluttering last night.

## 2022-01-24 NOTE — Discharge Instructions (Signed)
Continue to take all your medications as prescribed by your doctor.  See your doctor this week for a follow-up appointment.  You have any new or worsening symptoms, call your doctor right away or return to the emergency department.

## 2022-01-26 ENCOUNTER — Encounter: Payer: Self-pay | Admitting: *Deleted

## 2022-01-26 ENCOUNTER — Ambulatory Visit: Payer: Medicaid Other | Attending: Nurse Practitioner | Admitting: Internal Medicine

## 2022-01-26 ENCOUNTER — Encounter: Payer: Self-pay | Admitting: Internal Medicine

## 2022-01-26 ENCOUNTER — Emergency Department: Payer: Medicaid Other

## 2022-01-26 ENCOUNTER — Other Ambulatory Visit: Payer: Self-pay

## 2022-01-26 VITALS — BP 122/90 | HR 105 | Ht 77.0 in | Wt 381.0 lb

## 2022-01-26 DIAGNOSIS — W06XXXA Fall from bed, initial encounter: Secondary | ICD-10-CM | POA: Diagnosis not present

## 2022-01-26 DIAGNOSIS — G8929 Other chronic pain: Secondary | ICD-10-CM | POA: Diagnosis not present

## 2022-01-26 DIAGNOSIS — I509 Heart failure, unspecified: Secondary | ICD-10-CM | POA: Diagnosis not present

## 2022-01-26 DIAGNOSIS — R079 Chest pain, unspecified: Secondary | ICD-10-CM | POA: Diagnosis not present

## 2022-01-26 DIAGNOSIS — E119 Type 2 diabetes mellitus without complications: Secondary | ICD-10-CM | POA: Diagnosis not present

## 2022-01-26 DIAGNOSIS — R42 Dizziness and giddiness: Secondary | ICD-10-CM | POA: Insufficient documentation

## 2022-01-26 DIAGNOSIS — S0990XA Unspecified injury of head, initial encounter: Secondary | ICD-10-CM | POA: Insufficient documentation

## 2022-01-26 DIAGNOSIS — I4819 Other persistent atrial fibrillation: Secondary | ICD-10-CM | POA: Diagnosis present

## 2022-01-26 DIAGNOSIS — Z79899 Other long term (current) drug therapy: Secondary | ICD-10-CM | POA: Diagnosis not present

## 2022-01-26 DIAGNOSIS — I429 Cardiomyopathy, unspecified: Secondary | ICD-10-CM | POA: Insufficient documentation

## 2022-01-26 DIAGNOSIS — I110 Hypertensive heart disease with heart failure: Secondary | ICD-10-CM | POA: Insufficient documentation

## 2022-01-26 DIAGNOSIS — Z7901 Long term (current) use of anticoagulants: Secondary | ICD-10-CM | POA: Diagnosis not present

## 2022-01-26 DIAGNOSIS — G4733 Obstructive sleep apnea (adult) (pediatric): Secondary | ICD-10-CM | POA: Diagnosis not present

## 2022-01-26 DIAGNOSIS — I482 Chronic atrial fibrillation, unspecified: Secondary | ICD-10-CM

## 2022-01-26 LAB — CBC WITH DIFFERENTIAL/PLATELET
Abs Immature Granulocytes: 0.06 10*3/uL (ref 0.00–0.07)
Basophils Absolute: 0 10*3/uL (ref 0.0–0.1)
Basophils Relative: 1 %
Eosinophils Absolute: 0.1 10*3/uL (ref 0.0–0.5)
Eosinophils Relative: 2 %
HCT: 45 % (ref 39.0–52.0)
Hemoglobin: 14.6 g/dL (ref 13.0–17.0)
Immature Granulocytes: 1 %
Lymphocytes Relative: 29 %
Lymphs Abs: 2.3 10*3/uL (ref 0.7–4.0)
MCH: 29.4 pg (ref 26.0–34.0)
MCHC: 32.4 g/dL (ref 30.0–36.0)
MCV: 90.5 fL (ref 80.0–100.0)
Monocytes Absolute: 0.7 10*3/uL (ref 0.1–1.0)
Monocytes Relative: 9 %
Neutro Abs: 4.6 10*3/uL (ref 1.7–7.7)
Neutrophils Relative %: 58 %
Platelets: 239 10*3/uL (ref 150–400)
RBC: 4.97 MIL/uL (ref 4.22–5.81)
RDW: 13.4 % (ref 11.5–15.5)
WBC: 7.9 10*3/uL (ref 4.0–10.5)
nRBC: 0 % (ref 0.0–0.2)

## 2022-01-26 LAB — COMPREHENSIVE METABOLIC PANEL
ALT: 21 U/L (ref 0–44)
AST: 21 U/L (ref 15–41)
Albumin: 4.6 g/dL (ref 3.5–5.0)
Alkaline Phosphatase: 77 U/L (ref 38–126)
Anion gap: 13 (ref 5–15)
BUN: 16 mg/dL (ref 6–20)
CO2: 31 mmol/L (ref 22–32)
Calcium: 9.6 mg/dL (ref 8.9–10.3)
Chloride: 94 mmol/L — ABNORMAL LOW (ref 98–111)
Creatinine, Ser: 1.3 mg/dL — ABNORMAL HIGH (ref 0.61–1.24)
GFR, Estimated: >60 mL/min (ref >60)
Glucose, Bld: 103 mg/dL — ABNORMAL HIGH (ref 70–99)
Potassium: 4.1 mmol/L (ref 3.5–5.1)
Sodium: 138 mmol/L (ref 135–145)
Total Bilirubin: 0.9 mg/dL (ref 0.3–1.2)
Total Protein: 8.2 g/dL — ABNORMAL HIGH (ref 6.5–8.1)

## 2022-01-26 LAB — TROPONIN I (HIGH SENSITIVITY): Troponin I (High Sensitivity): 3 ng/L (ref <18)

## 2022-01-26 MED ORDER — METOPROLOL TARTRATE 100 MG PO TABS: 100.0000 mg | ORAL_TABLET | Freq: Two times a day (BID) | ORAL | 1 refills | Status: DC

## 2022-01-26 NOTE — Progress Notes (Unsigned)
Follow-up Outpatient Visit Date: 01/26/2022  Primary Care Provider: Lauretta Grill, NP [facility] [place] [id number]  Chief Complaint: Follow-up atrial fibrillation and cardiomyopathy  HPI:  Daniel [name] is a 31 y.o. male with history of cardiomyopathy with low normal to mildly reduced LVEF, persistent atrial fibrillation, questionable TIA, hypertension, hyperlipidemia, sleep disordered breathing, seizure disorder, conversion disorder, and schizoaffective disorder, who presents for follow-up of atrial fibrillation and heart failure.  I met him once during hospitalization at [facility] earlier this year.  He was last seen in our office in May by Ignacia Bayley, NP.  He has followed since then with Darylene Price in the heart failure clinic.  He presents today with a caregiver from his group home.  Daniel [name] has multiple complaints today including frequent palpitations, chest pain, shortness of breath, lightheadedness, and shortness of breath.  He has been seen in the ED numerous times over the last month, most recently 2 days ago, for the same symptoms.  Work-ups have been unrevealing other than atrial fibrillation with rapid ventricular response.  He request diltiazem infusion at this time, as it seems to help him feel the best.  He reports being compliant with his home medications including metoprolol and diltiazem.  However, on further questioning it turns out that he is not taking Entresto because it is not covered by his insurance.  He also has been unable to procure oxygen tanks to allow him to travel away from his oxygen concentrator.  He remains on apixaban without bleeding.  He was previously referred for sleep evaluation but did not move forward with this because he was "freaked out" about this.  --------------------------------------------------------------------------------------------------  Past Medical History:  Diagnosis Date   Atrial fibrillation ([place])    Cardiomyopathy ([place])    a.  07/2021 Echo: EF 50%, mild LVH, nl RV size/function. No significant valvular disease.   Chest pain    CHF (congestive heart failure) (HCC)    Conversion disorder    Current use of long term anticoagulation    Diabetes mellitus without complication (HCC)    Hyperlipidemia    Hypertension    OSA (obstructive sleep apnea)    Persistent atrial fibrillation and flutter ([place])    a. CHA2DS2VASc = 3-4   Schizoaffective disorder (HCC)    Seizure disorder (HCC)    TIA (transient ischemic attack)    Urethral stricture    a. 08/2021 s/p cystoscopy and urethral dilation.   Past Surgical History:  Procedure Laterality Date   CYSTOSCOPY WITH URETHRAL DILATATION N/A 09/07/2021   Procedure: CYSTOSCOPY WITH URETHRAL DILATATION CATHETER PLACEMENT;  Surgeon: Abbie Sons, MD;  Location: [facility] ORS;  Service: Urology;  Laterality: N/A;    Current Meds  Medication Sig   acetaminophen (TYLENOL) 650 MG suppository Place 1,300 mg rectally every 4 (four) hours as needed.   albuterol (VENTOLIN HFA) 108 (90 Base) MCG/ACT inhaler Inhale 2 puffs into the lungs every 6 (six) hours as needed for wheezing.   allopurinol (ZYLOPRIM) 300 MG tablet Take 1 tablet (300 mg total) by mouth daily.   atorvastatin (LIPITOR) 80 MG tablet Take 80 mg by mouth daily.   baclofen (LIORESAL) 10 MG tablet Take 1 tablet (10 mg total) by mouth 2 (two) times daily as needed for muscle spasms. Home med.   benztropine (COGENTIN) 1 MG tablet Take 1 mg by mouth daily.   cyclobenzaprine (FLEXERIL) 5 MG tablet Take 5 mg by mouth 3 (three) times daily as needed for muscle spasms.  dapagliflozin propanediol (FARXIGA) 10 MG TABS tablet Take 1 tablet (10 mg total) by mouth daily before breakfast.   diclofenac Sodium (VOLTAREN) 1 % GEL Apply 2 g topically 4 (four) times daily.   diltiazem (CARDIZEM CD) 360 MG 24 hr capsule Take 1 capsule (360 mg total) by mouth daily.   divalproex (DEPAKOTE) 500 MG DR tablet Take 2 tablets (1,000 mg total) by  mouth 2 (two) times daily.   ELIQUIS 5 MG TABS tablet Take 5 mg by mouth 2 (two) times daily.   escitalopram (LEXAPRO) 10 MG tablet Take 20 mg by mouth daily.   fluticasone (FLONASE) 50 MCG/ACT nasal spray Place 2 sprays into both nostrils 2 (two) times daily.   furosemide (LASIX) 20 MG tablet Take 1 tablet (20 mg total) by mouth daily.   HYDROcodone-acetaminophen (NORCO/VICODIN) 5-325 MG tablet Take 1 tablet by mouth every 4 (four) hours as needed.   hydrOXYzine (ATARAX) 25 MG tablet Take 25 mg by mouth daily. At noon   hydrOXYzine (ATARAX) 50 MG tablet Take 50 mg by mouth in the morning and at bedtime.   ibuprofen (ADVIL) 800 MG tablet Take 800 mg by mouth every 8 (eight) hours as needed.   INVEGA 9 MG 24 hr tablet Take 9 mg by mouth every morning.   lithium carbonate (LITHOBID) 300 MG CR tablet Take 600 mg by mouth at bedtime as needed.   loratadine (CLARITIN) 10 MG tablet Take 10 mg by mouth daily.   melatonin 3 MG TABS tablet Take 3 mg by mouth at bedtime.   naproxen (NAPROSYN) 500 MG tablet Take 1 tablet (500 mg total) by mouth 2 (two) times daily with a meal.   omeprazole (PRILOSEC) 20 MG capsule Take 20 mg by mouth daily.   potassium chloride SA (KLOR-CON M) 20 MEQ tablet Take 1 tablet (20 mEq total) by mouth daily for 7 days.   promethazine (PHENERGAN) 25 MG tablet Take 25 mg by mouth every 6 (six) hours as needed for nausea or vomiting.   sacubitril-valsartan (ENTRESTO) 24-26 MG Take 1 tablet by mouth 2 (two) times daily.   tamsulosin (FLOMAX) 0.4 MG CAPS capsule Take 1 capsule (0.4 mg total) by mouth daily.   traMADol (ULTRAM) 50 MG tablet Take 50 mg by mouth every 6 (six) hours as needed.   [DISCONTINUED] metoprolol tartrate (LOPRESSOR) 50 MG tablet Take 50 mg by mouth 2 (two) times daily.    Allergies: Haldol [haloperidol], Dermatitis antigen, Meperidine, Abilify [aripiprazole], Demerol [meperidine hcl], and Tape  Social History   Tobacco Use   Smoking status: Former     Types: Cigarettes   Smokeless tobacco: Never  Vaping Use   Vaping Use: Never used  Substance Use Topics   Alcohol use: Not Currently   Drug use: Never    History reviewed. No pertinent family history.  Review of Systems: A 12-system review of systems was performed and was negative except as noted in the HPI.  --------------------------------------------------------------------------------------------------  Physical Exam: BP (!) 122/90 (BP Location: Left Arm, Patient Position: Sitting, Cuff Size: Large)   Pulse (!) 105   Ht 6' 5"  (1.956 m)   Wt (!) 381 lb (172.8 kg)   SpO2 96%   BMI 45.18 kg/m   General: Anxious appearing, accompanied by caregiver from his group home. Neck: Unable to assess JVP due to body habitus. Lungs: Clear to auscultation bilaterally without wheezes or crackles. Heart: Irregularly irregular without murmurs. Abdomen: Soft, nontender, nondistended. Extremities: No lower extremity edema.  EKG: Atrial fibrillation  with rapid ventricular response and PVCs versus aberrancy (ventricular rate 105 bpm) and nonspecific T wave changes.  Compared to prior tracing from 01/24/2022, ventricular rate has improved.  Inferolateral ST/T changes are also less pronounced.  Lab Results  Component Value Date   WBC 7.9 01/26/2022   HGB 14.6 01/26/2022   HCT 45.0 01/26/2022   MCV 90.5 01/26/2022   PLT 239 01/26/2022    Lab Results  Component Value Date   NA 138 01/26/2022   K 4.1 01/26/2022   CL 94 (L) 01/26/2022   CO2 31 01/26/2022   BUN 16 01/26/2022   CREATININE 1.30 (H) 01/26/2022   GLUCOSE 103 (H) 01/26/2022   ALT 21 01/26/2022    Lab Results  Component Value Date   CHOL 133 08/29/2021   HDL 32 (L) 08/29/2021   LDLCALC 50 08/29/2021   TRIG 255 (H) 08/29/2021   CHOLHDL 4.2 08/29/2021    --------------------------------------------------------------------------------------------------  ASSESSMENT AND PLAN: Persistent atrial fibrillation and  obstructive sleep apnea: Daniel [name] continues to be in atrial fibrillation with suboptimal ventricular rate control much of the time.  Unfortunately his treatment options are limited.  He was previously evaluated by Dr. Quentin Ore and felt to be a poor candidate for antiarrhythmic therapy and ablation at this time on account of his morbid obesity, untreated presumed obstructive sleep apnea, and recurrent self titration of medications and medication noncompliance.  We discussed the importance of medication compliance as well as moving forward with sleep evaluation.  We will continue diltiazem 360 mg daily and increase metoprolol to tartrate to 100 mg twice daily.  Apixaban 5 mg twice daily will also be continued.  If he is able to demonstrate medication compliance, lose weight, and undergo treatment of his sleep apnea, repeat EP consultation could be considered in the future.  Cardiomyopathy: Presumed nonischemic and most likely related to tachycardia from persistent atrial fibrillation.  As above, we will increase metoprolol.  My goal would be to wean him off diltiazem in the future, if possible.  I am reluctant to add digoxin for rate control given history of medication noncompliance and self titration of medications as well as potential interactions with other medications.  Intra stones currently cost prohibitive.  I will defer challenging him with an ACE inhibitor or ARB at this time while we attempt to optimize his ventricular rate control.  Morbid obesity: BMI remains greater than 40.  Continued weight loss encouraged.  Follow-up: Return to clinic in 2 weeks.  Nelva Bush, MD 01/27/2022 7:37 AM

## 2022-01-26 NOTE — Patient Instructions (Signed)
Medication Instructions:  Your physician has recommended you make the following change in your medication:   INCREASE Metoprolol to 100 mg twice a day.   *If you need a refill on your cardiac medications before your next appointment, please call your pharmacy*   Lab Work: None ordered If you have labs (blood work) drawn today and your tests are completely normal, you will receive your results only by: MyChart Message (if you have MyChart) OR A paper copy in the mail If you have any lab test that is abnormal or we need to change your treatment, we will call you to review the results.   Testing/Procedures: None ordered   Follow-Up: At [facility], you and your health needs are our priority.  As part of our continuing mission to provide you with exceptional heart care, we have created designated Provider Care Teams.  These Care Teams include your primary Cardiologist (physician) and Advanced Practice Providers (APPs -  Physician Assistants and Nurse Practitioners) who all work together to provide you with the care you need, when you need it.  We recommend signing up for the patient portal called "MyChart".  Sign up information is provided on this After Visit Summary.  MyChart is used to connect with patients for Virtual Visits (Telemedicine).  Patients are able to view lab/test results, encounter notes, upcoming appointments, etc.  Non-urgent messages can be sent to your provider as well.   To learn more about what you can do with MyChart, go to [email or web address].    Your next appointment:   2 week(s)  The format for your next appointment:   In Person  Provider:   You will see one of the following Advanced Practice Providers on your designated Care Team:   Nicolasa Ducking, NP Eula Listen, PA-C Cadence Fransico [name], PA-C Charlsie Quest, NP  Other Instructions You have been referred to [facility] for management of your sleep apnea. Their office will contact you to  schedule an appt.   Important Information About Sugar

## 2022-01-26 NOTE — ED Triage Notes (Signed)
Pt reports he fell out of the bed and hit head last night.  Pt reports weakness and feeling like he is going to pass out.  Pt saw dr end today.  Pt alert   speech clear.

## 2022-01-27 ENCOUNTER — Encounter: Payer: Self-pay | Admitting: Internal Medicine

## 2022-01-27 ENCOUNTER — Emergency Department
Admission: EM | Admit: 2022-01-27 | Discharge: 2022-01-27 | Disposition: A | Discharge status: Home / Self Care | Payer: Medicaid Other | Attending: Emergency Medicine | Admitting: Emergency Medicine

## 2022-01-27 ENCOUNTER — Emergency Department: Payer: Medicaid Other

## 2022-01-27 DIAGNOSIS — I429 Cardiomyopathy, unspecified: Secondary | ICD-10-CM | POA: Insufficient documentation

## 2022-01-27 DIAGNOSIS — R42 Dizziness and giddiness: Secondary | ICD-10-CM

## 2022-01-27 DIAGNOSIS — G8929 Other chronic pain: Secondary | ICD-10-CM

## 2022-01-27 DIAGNOSIS — R4689 Other symptoms and signs involving appearance and behavior: Secondary | ICD-10-CM

## 2022-01-27 DIAGNOSIS — S0990XA Unspecified injury of head, initial encounter: Secondary | ICD-10-CM

## 2022-01-27 LAB — BRAIN NATRIURETIC PEPTIDE: B Natriuretic Peptide: 36 pg/mL (ref 0.0–100.0)

## 2022-01-27 LAB — TROPONIN I (HIGH SENSITIVITY): Troponin I (High Sensitivity): 3 ng/L (ref <18)

## 2022-01-27 MED ORDER — METOPROLOL TARTRATE 50 MG PO TABS
100.0000 mg | ORAL_TABLET | Freq: Once | ORAL | Status: AC
  Administered 2022-01-27: 100 mg via ORAL
  Filled 2022-01-27: qty 2
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
## 2022-01-27 NOTE — ED Notes (Signed)
Pt ambulated around the room x10. SpO2 maintained greater than 96% on chronic 4L [place] while ambulating and conversing with RN. HR remained between 105-115.

## 2022-01-27 NOTE — Progress Notes (Signed)
   01/27/22 0000  Clinical Encounter Type  Visited With Patient  Visit Type Initial  Referral From Nurse  Consult/Referral To Chaplain   Chaplain responded to request for Chaplain in ED waiting. Patient was sad and teary and appeared anxious about living situation in group home. Chaplain provided compassionate presence and reflective listening as patient spoke about health challenges. Chaplain provided hospitality and prayer. Patient appreciated Chaplain visit.

## 2022-01-27 NOTE — ED Notes (Signed)
XR at bedside

## 2022-02-07 ENCOUNTER — Emergency Department: Payer: Medicaid Other

## 2022-02-07 ENCOUNTER — Emergency Department
Admission: EM | Admit: 2022-02-07 | Discharge: 2022-02-07 | Disposition: A | Discharge status: Home / Self Care | Payer: Medicaid Other | Attending: Emergency Medicine | Admitting: Emergency Medicine

## 2022-02-07 ENCOUNTER — Other Ambulatory Visit: Payer: Self-pay

## 2022-02-07 DIAGNOSIS — Z23 Encounter for immunization: Secondary | ICD-10-CM | POA: Insufficient documentation

## 2022-02-07 DIAGNOSIS — E119 Type 2 diabetes mellitus without complications: Secondary | ICD-10-CM | POA: Insufficient documentation

## 2022-02-07 DIAGNOSIS — S0990XA Unspecified injury of head, initial encounter: Secondary | ICD-10-CM | POA: Diagnosis present

## 2022-02-07 DIAGNOSIS — I10 Essential (primary) hypertension: Secondary | ICD-10-CM | POA: Diagnosis not present

## 2022-02-07 DIAGNOSIS — W228XXA Striking against or struck by other objects, initial encounter: Secondary | ICD-10-CM | POA: Diagnosis not present

## 2022-02-07 DIAGNOSIS — Z20822 Contact with and (suspected) exposure to covid-19: Secondary | ICD-10-CM | POA: Diagnosis not present

## 2022-02-07 DIAGNOSIS — Z7901 Long term (current) use of anticoagulants: Secondary | ICD-10-CM | POA: Diagnosis not present

## 2022-02-07 DIAGNOSIS — I482 Chronic atrial fibrillation, unspecified: Secondary | ICD-10-CM | POA: Insufficient documentation

## 2022-02-07 LAB — RESP PANEL BY RT-PCR (FLU A&B, COVID) ARPGX2
Influenza A by PCR: NEGATIVE
Influenza B by PCR: NEGATIVE
SARS Coronavirus 2 by RT PCR: NEGATIVE

## 2022-02-07 LAB — BASIC METABOLIC PANEL
Anion gap: 9 (ref 5–15)
BUN: 13 mg/dL (ref 6–20)
CO2: 29 mmol/L (ref 22–32)
Calcium: 9 mg/dL (ref 8.9–10.3)
Chloride: 101 mmol/L (ref 98–111)
Creatinine, Ser: 0.96 mg/dL (ref 0.61–1.24)
GFR, Estimated: >60 mL/min (ref >60)
Glucose, Bld: 103 mg/dL — ABNORMAL HIGH (ref 70–99)
Potassium: 4.2 mmol/L (ref 3.5–5.1)
Sodium: 139 mmol/L (ref 135–145)

## 2022-02-07 LAB — CBC WITH DIFFERENTIAL/PLATELET
Abs Immature Granulocytes: 0.04 10*3/uL (ref 0.00–0.07)
Basophils Absolute: 0 10*3/uL (ref 0.0–0.1)
Basophils Relative: 1 %
Eosinophils Absolute: 0.2 10*3/uL (ref 0.0–0.5)
Eosinophils Relative: 3 %
HCT: 40.9 % (ref 39.0–52.0)
Hemoglobin: 13 g/dL (ref 13.0–17.0)
Immature Granulocytes: 1 %
Lymphocytes Relative: 30 %
Lymphs Abs: 1.6 10*3/uL (ref 0.7–4.0)
MCH: 29.3 pg (ref 26.0–34.0)
MCHC: 31.8 g/dL (ref 30.0–36.0)
MCV: 92.1 fL (ref 80.0–100.0)
Monocytes Absolute: 0.6 10*3/uL (ref 0.1–1.0)
Monocytes Relative: 12 %
Neutro Abs: 2.9 10*3/uL (ref 1.7–7.7)
Neutrophils Relative %: 53 %
Platelets: 230 10*3/uL (ref 150–400)
RBC: 4.44 MIL/uL (ref 4.22–5.81)
RDW: 13 % (ref 11.5–15.5)
WBC: 5.5 10*3/uL (ref 4.0–10.5)
nRBC: 0 % (ref 0.0–0.2)

## 2022-02-07 LAB — BRAIN NATRIURETIC PEPTIDE: B Natriuretic Peptide: 56.8 pg/mL (ref 0.0–100.0)

## 2022-02-07 LAB — TROPONIN I (HIGH SENSITIVITY): Troponin I (High Sensitivity): 3 ng/L (ref <18)

## 2022-02-07 LAB — AMMONIA: Ammonia: 27 umol/L (ref 9–35)

## 2022-02-07 MED ORDER — TETANUS-DIPHTH-ACELL PERTUSSIS 5-2.5-18.5 LF-MCG/0.5 IM SUSY
0.5000 mL | PREFILLED_SYRINGE | Freq: Once | INTRAMUSCULAR | Status: AC
  Administered 2022-02-07: 0.5 mL via INTRAMUSCULAR
  Filled 2022-02-07: qty 0.5

## 2022-02-07 MED ORDER — ACETAMINOPHEN 325 MG PO TABS
650.0000 mg | ORAL_TABLET | Freq: Once | ORAL | Status: AC
  Administered 2022-02-07: 650 mg via ORAL
  Filled 2022-02-07: qty 2
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
## 2022-02-07 NOTE — Discharge Instructions (Addendum)
Your CT scan, x-ray, and blood tests are reassuring.  Please return for any new, worsening, or change in symptoms or other concerns.  It was a pleasure caring for you today.

## 2022-02-07 NOTE — ED Notes (Signed)
Pt stating he wears 4L [place] at home and didn't bring it with him. Room air saturation 96%. Pt placed on oxygen per request for comfort. WCTM.

## 2022-02-09 ENCOUNTER — Encounter: Payer: Self-pay | Admitting: Internal Medicine

## 2022-02-09 ENCOUNTER — Ambulatory Visit: Payer: Medicaid Other | Attending: Internal Medicine | Admitting: Internal Medicine

## 2022-02-09 VITALS — BP 130/80 | HR 90 | Ht 77.0 in | Wt 383.0 lb

## 2022-02-09 DIAGNOSIS — I4819 Other persistent atrial fibrillation: Secondary | ICD-10-CM | POA: Diagnosis present

## 2022-02-09 DIAGNOSIS — I429 Cardiomyopathy, unspecified: Secondary | ICD-10-CM | POA: Diagnosis not present

## 2022-02-09 NOTE — Patient Instructions (Signed)
Medication Instructions:   Your physician recommends that you continue on your current medications as directed. Please refer to the Current Medication list given to you today.  *If you need a refill on your cardiac medications before your next appointment, please call your pharmacy*    Follow-Up: At [facility], you and your health needs are our priority.  As part of our continuing mission to provide you with exceptional heart care, we have created designated Provider Care Teams.  These Care Teams include your primary Cardiologist (physician) and Advanced Practice Providers (APPs -  Physician Assistants and Nurse Practitioners) who all work together to provide you with the care you need, when you need it.  We recommend signing up for the patient portal called "MyChart".  Sign up information is provided on this After Visit Summary.  MyChart is used to connect with patients for Virtual Visits (Telemedicine).  Patients are able to view lab/test results, encounter notes, upcoming appointments, etc.  Non-urgent messages can be sent to your provider as well.   To learn more about what you can do with MyChart, go to [email or web address].    Your next appointment:   3 month(s)  The format for your next appointment:   In Person  Provider:   You may see Yvonne Kendall, MD or one of the following Advanced Practice Providers on your designated Care Team:   Nicolasa Ducking, NP Eula Listen, PA-C Cadence Fransico [name], PA-C Charlsie Quest, NP     Important Information About Sugar
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
## 2022-02-14 ENCOUNTER — Other Ambulatory Visit: Payer: Self-pay

## 2022-02-14 ENCOUNTER — Encounter: Payer: Self-pay | Admitting: *Deleted

## 2022-02-14 ENCOUNTER — Emergency Department
Admission: EM | Admit: 2022-02-14 | Discharge: 2022-02-14 | Disposition: A | Discharge status: Home / Self Care | Payer: Medicaid Other | Attending: Emergency Medicine | Admitting: Emergency Medicine

## 2022-02-14 DIAGNOSIS — N3090 Cystitis, unspecified without hematuria: Secondary | ICD-10-CM | POA: Insufficient documentation

## 2022-02-14 DIAGNOSIS — E119 Type 2 diabetes mellitus without complications: Secondary | ICD-10-CM | POA: Insufficient documentation

## 2022-02-14 DIAGNOSIS — R609 Edema, unspecified: Secondary | ICD-10-CM

## 2022-02-14 DIAGNOSIS — R600 Localized edema: Secondary | ICD-10-CM | POA: Diagnosis present

## 2022-02-14 DIAGNOSIS — I509 Heart failure, unspecified: Secondary | ICD-10-CM | POA: Diagnosis not present

## 2022-02-14 LAB — URINALYSIS, ROUTINE W REFLEX MICROSCOPIC
Bilirubin Urine: NEGATIVE
Glucose, UA: >=500 mg/dL — AB
Hgb urine dipstick: NEGATIVE
Ketones, ur: NEGATIVE mg/dL
Nitrite: NEGATIVE
Protein, ur: NEGATIVE mg/dL
Specific Gravity, Urine: 1.022 (ref 1.005–1.030)
WBC, UA: >50 WBC/hpf — ABNORMAL HIGH (ref 0–5)
pH: 5 (ref 5.0–8.0)

## 2022-02-14 LAB — BASIC METABOLIC PANEL
Anion gap: 9 (ref 5–15)
BUN: 13 mg/dL (ref 6–20)
CO2: 31 mmol/L (ref 22–32)
Calcium: 9.3 mg/dL (ref 8.9–10.3)
Chloride: 98 mmol/L (ref 98–111)
Creatinine, Ser: 1 mg/dL (ref 0.61–1.24)
GFR, Estimated: >60 mL/min (ref >60)
Glucose, Bld: 128 mg/dL — ABNORMAL HIGH (ref 70–99)
Potassium: 4 mmol/L (ref 3.5–5.1)
Sodium: 138 mmol/L (ref 135–145)

## 2022-02-14 MED ORDER — CEPHALEXIN 500 MG PO CAPS: 500.0000 mg | ORAL_CAPSULE | Freq: Three times a day (TID) | ORAL | 0 refills | Status: AC

## 2022-02-14 MED ORDER — CEPHALEXIN 500 MG PO CAPS
500.0000 mg | ORAL_CAPSULE | Freq: Once | ORAL | Status: AC
  Administered 2022-02-14: 500 mg via ORAL
  Filled 2022-02-14: qty 1

## 2022-02-14 NOTE — ED Notes (Signed)
Bladder scanner = 16ml.

## 2022-02-14 NOTE — ED Provider Notes (Signed)
[facility] Provider Note    Event Date/Time   First MD Initiated Contact with Patient 02/14/22 1710     (approximate)   History   Chief Complaint: Leg Swelling   HPI  Doroteo Nickolson is a 31 y.o. male with a history of morbid obesity, atrial fibrillation, sleep apnea, diabetes, schizoaffective disorder, anxiety who comes the ED complaining of swelling in both legs.  He does have a history of CHF and is on chronic diuretics.  Reports compliance with his medications.  He reports 20 pound weight loss over the last several months and that he checks his weight every day and it has not increased lately.  No specific chest pain or shortness of breath.  No fever, no other acute symptoms.  He also reports some dysuria recently over the past 2 weeks.  Physical Exam   Triage Vital Signs: ED Triage Vitals  Enc Vitals Group     BP 02/14/22 1701 (!) 129/92     Pulse Rate 02/14/22 1701 90     Resp 02/14/22 1701 20     Temp 02/14/22 1701 98.7 F (37.1 C)     Temp Source 02/14/22 1701 Oral     SpO2 02/14/22 1701 95 %     Weight 02/14/22 1659 99 lb 3.3 oz (45 kg)     Height 02/14/22 1659 6\' 5"  (1.956 m)     Head Circumference --      Peak Flow --      Pain Score 02/14/22 1659 0     Pain Loc --      Pain Edu? --      Excl. in [place]? --     Most recent vital signs: Vitals:   02/14/22 1701  BP: (!) 129/92  Pulse: 90  Resp: 20  Temp: 98.7 F (37.1 C)  SpO2: 95%    General: Awake, no distress.  CV:  Good peripheral perfusion.  Regular rate and rhythm. Resp:  Normal effort.  Clear to auscultation bilaterally Abd:  No distention.  Soft nontender Other:  Trace pitting edema bilateral lower extremities.  Symmetric calf circumference, no calf tenderness.   ED Results / Procedures / Treatments   Labs (all labs ordered are listed, but only abnormal results are displayed) Labs Reviewed  BASIC METABOLIC PANEL - Abnormal; Notable for the following components:       Result Value   Glucose, Bld 128 (*)    All other components within normal limits  URINALYSIS, ROUTINE W REFLEX MICROSCOPIC - Abnormal; Notable for the following components:   Color, Urine YELLOW (*)    APPearance HAZY (*)    Glucose, UA >=500 (*)    Leukocytes,Ua LARGE (*)    WBC, UA >50 (*)    Bacteria, UA RARE (*)    All other components within normal limits  URINE CULTURE     EKG    RADIOLOGY    PROCEDURES:  Procedures   MEDICATIONS ORDERED IN ED: Medications  cephALEXin (KEFLEX) capsule 500 mg (has no administration in time range)     IMPRESSION / MDM / ASSESSMENT AND PLAN / ED COURSE  I reviewed the triage vital signs and the nursing notes.                              Differential diagnosis includes, but is not limited to, renal insufficiency, electrolyte abnormality, chronic peripheral edema, UTI, anemia  Patient's presentation is most  consistent with exacerbation of chronic illness.  Patient presents with complaints of peripheral edema.  This is a chronic issue for him.  He is well-known to this department with frequent presentations for minor complaints.  He appears to be in his usual state of health, and I think his symptoms today are mostly related to anxiety.  We will check labs.  Low suspicion for ACS, acute CHF, DVT.   ----------------------------------------- 6:46 PM on 02/14/2022 ----------------------------------------- Patient provided urine sample, which shows some signs of UTI.  Chemistry panel is normal.  Postvoid residual is 6 mL on bladder scan.  Will start Keflex, order urine culture.      FINAL CLINICAL IMPRESSION(S) / ED DIAGNOSES   Final diagnoses:  Peripheral edema  Cystitis     Rx / DC Orders   ED Discharge Orders          Ordered    cephALEXin (KEFLEX) 500 MG capsule  3 times daily        02/14/22 1845             Note:  This document was prepared using Dragon voice recognition software and may include  unintentional dictation errors.   Sharman Cheek, MD 02/14/22 [phone]

## 2022-02-14 NOTE — ED Triage Notes (Signed)
Pt has swelling to both lower legs for a couple of days.  Pt ambulating without diff.  Pt alert speech clear.

## 2022-02-14 NOTE — Discharge Instructions (Addendum)
Your blood tests are normal. Your urine test shows signs of an infection, so you should take Keflex as prescribed for the next 5 days.

## 2022-02-16 LAB — URINE CULTURE: Culture: <10000 — AB

## 2022-02-24 ENCOUNTER — Encounter: Payer: Self-pay | Admitting: Family

## 2022-02-24 ENCOUNTER — Ambulatory Visit: Payer: Medicaid Other | Attending: Family | Admitting: Family

## 2022-02-24 VITALS — BP 139/82 | HR 86 | Resp 20 | Ht 77.0 in | Wt 384.0 lb

## 2022-02-24 DIAGNOSIS — I5022 Chronic systolic (congestive) heart failure: Secondary | ICD-10-CM

## 2022-02-24 DIAGNOSIS — G4730 Sleep apnea, unspecified: Secondary | ICD-10-CM | POA: Insufficient documentation

## 2022-02-24 DIAGNOSIS — Z87891 Personal history of nicotine dependence: Secondary | ICD-10-CM | POA: Diagnosis not present

## 2022-02-24 DIAGNOSIS — Z7984 Long term (current) use of oral hypoglycemic drugs: Secondary | ICD-10-CM | POA: Insufficient documentation

## 2022-02-24 DIAGNOSIS — E119 Type 2 diabetes mellitus without complications: Secondary | ICD-10-CM | POA: Insufficient documentation

## 2022-02-24 DIAGNOSIS — I4819 Other persistent atrial fibrillation: Secondary | ICD-10-CM | POA: Insufficient documentation

## 2022-02-24 DIAGNOSIS — Z79899 Other long term (current) drug therapy: Secondary | ICD-10-CM | POA: Insufficient documentation

## 2022-02-24 DIAGNOSIS — I110 Hypertensive heart disease with heart failure: Secondary | ICD-10-CM | POA: Insufficient documentation

## 2022-02-24 DIAGNOSIS — E785 Hyperlipidemia, unspecified: Secondary | ICD-10-CM | POA: Diagnosis not present

## 2022-02-24 DIAGNOSIS — F259 Schizoaffective disorder, unspecified: Secondary | ICD-10-CM | POA: Insufficient documentation

## 2022-02-24 DIAGNOSIS — Z9981 Dependence on supplemental oxygen: Secondary | ICD-10-CM | POA: Diagnosis not present

## 2022-02-24 DIAGNOSIS — Z8673 Personal history of transient ischemic attack (TIA), and cerebral infarction without residual deficits: Secondary | ICD-10-CM | POA: Insufficient documentation

## 2022-02-24 DIAGNOSIS — I10 Essential (primary) hypertension: Secondary | ICD-10-CM

## 2022-02-24 DIAGNOSIS — F250 Schizoaffective disorder, bipolar type: Secondary | ICD-10-CM

## 2022-02-24 NOTE — Patient Instructions (Signed)
Continue weighing daily and call for an overnight weight gain of 3 pounds or more or a weekly weight gain of more than 5 pounds.  °

## 2022-02-24 NOTE — Progress Notes (Signed)
Patient ID: Haik Mahoney, male    DOB: 12/24/1990, 31 y.o.   MRN: [id number]  HPI  Mr Kuiken is a 31 y/o male with a history of DM, hyperlipidemia, HTN, atrial fibrillation, conversion disorder, sleep apnea, schizoaffective disorder, seizure, TIA, tobacco use and chronic heart failure.   Echo report from 10/22/21 reviewed and showed an EF of 45-50%. Echo report from 08/04/21 reviewed and showed an EF of 50% along with mild LVH  2 ED visits month of September, 4 ED visits in August and 6 ED visits the month of July. Has been in the ED 8 times the month of June for various complaints. Was in the ED 11/04/21 due to palpitations thought to be due to a fib along with shortness of breath and palpitations. IV cardizem given. Developed AMS with code stroke called. Head CT negative. Glucose dropping so amp of D50 provided. Neurology evaluation done. He was released. Was in the ED 11/01/21, 10/29/21, 10/27/21, 10/24/21 and also 10/21/21. Was in the ED 10/16/21 due to chest pain and c/o current caregiver. Found to be in AF RVR. Given IV metoprolol along with IV lasix. Transitioned to oral medications. Head/cervical CT done because he says that he had a previous fall. Evaluated and subsequently released. Has had 7 other ED visits the month of May. In April had 9 ED visits along with 1 admission. Had 4 ED visits and 3 admissions in March.   He presents today for a follow-up visit with a chief complaint of minimal fatigue with moderate exertion. Describes this as chronic in nature having been present for several years. He has associated shortness of breath, pedal edema and chronic pain along with this. He denies any difficulty sleeping, dizziness, cough, wheezing, chest pain, palpitations, abdominal distention or weight gain.   Overall he reports feeling much better. Has been watching his diet and hopes to start at the [facility] next week.   Past Medical History:  Diagnosis Date   Atrial fibrillation (HCC)    Cardiomyopathy  (HCC)    a. 07/2021 Echo: EF 50%, mild LVH, nl RV size/function. No significant valvular disease.   Chest pain    CHF (congestive heart failure) (HCC)    Conversion disorder    Current use of long term anticoagulation    Diabetes mellitus without complication (HCC)    Hyperlipidemia    Hypertension    OSA (obstructive sleep apnea)    Persistent atrial fibrillation and flutter (HCC)    a. CHA2DS2VASc = 3-4   Schizoaffective disorder (HCC)    Seizure disorder (HCC)    TIA (transient ischemic attack)    Urethral stricture    a. 08/2021 s/p cystoscopy and urethral dilation.   Past Surgical History:  Procedure Laterality Date   CYSTOSCOPY WITH URETHRAL DILATATION N/A 09/07/2021   Procedure: CYSTOSCOPY WITH URETHRAL DILATATION CATHETER PLACEMENT;  Surgeon: Riki Altes, MD;  Location: [facility] ORS;  Service: Urology;  Laterality: N/A;   No family history on file. Social History   Tobacco Use   Smoking status: Former    Types: Cigarettes   Smokeless tobacco: Never  Substance Use Topics   Alcohol use: Not Currently   Allergies  Allergen Reactions   Haldol [Haloperidol] Other (See Comments)    SI   Dermatitis Antigen Rash   Meperidine     Meperidine   Abilify [Aripiprazole] Palpitations   Demerol [Meperidine Hcl] Hives   Tape Rash    Other reaction(s): Other (See Comments) Itching, skin tear  Prior to Admission medications   Medication Sig Start Date End Date Taking? Authorizing Provider  acetaminophen (TYLENOL) 650 MG suppository Place 1,300 mg rectally every 4 (four) hours as needed.   Yes [provider]  albuterol (VENTOLIN HFA) 108 (90 Base) MCG/ACT inhaler Inhale 2 puffs into the lungs every 6 (six) hours as needed for wheezing. 07/12/21  Yes [provider]  allopurinol (ZYLOPRIM) 300 MG tablet Take 1 tablet (300 mg total) by mouth daily. 08/29/21  Yes Wieting, Richard, MD  atorvastatin (LIPITOR) 80 MG tablet Take 80 mg by mouth daily. 05/11/21  Yes  [provider]  baclofen (LIORESAL) 10 MG tablet Take 1 tablet (10 mg total) by mouth 2 (two) times daily as needed for muscle spasms. Home med. 08/16/21  Yes Darlin Priestly, MD  benztropine (COGENTIN) 1 MG tablet Take 1 mg by mouth daily.   Yes [provider]  cyclobenzaprine (FLEXERIL) 5 MG tablet Take 5 mg by mouth 3 (three) times daily as needed for muscle spasms.   Yes [provider]  dapagliflozin propanediol (FARXIGA) 10 MG TABS tablet Take 1 tablet (10 mg total) by mouth daily before breakfast. 11/10/21  Yes [name] A, FNP  diclofenac Sodium (VOLTAREN) 1 % GEL Apply 2 g topically 4 (four) times daily. 12/30/21  Yes Sharman Cheek, MD  diltiazem (CARDIZEM CD) 360 MG 24 hr capsule Take 1 capsule (360 mg total) by mouth daily. 09/10/21  Yes Arnetha Courser, MD  divalproex (DEPAKOTE) 500 MG DR tablet Take 2 tablets (1,000 mg total) by mouth 2 (two) times daily. 09/25/21  Yes Wouk, Wilfred Curtis, MD  ELIQUIS 5 MG TABS tablet Take 5 mg by mouth 2 (two) times daily. 06/16/21  Yes [provider]  escitalopram (LEXAPRO) 10 MG tablet Take 20 mg by mouth daily. 06/16/21  Yes [provider]  fluticasone (FLONASE) 50 MCG/ACT nasal spray Place 2 sprays into both nostrils 2 (two) times daily. 06/16/21  Yes [provider]  furosemide (LASIX) 20 MG tablet Take 1 tablet (20 mg total) by mouth daily. 12/30/21  Yes Sharman Cheek, MD  HYDROcodone-acetaminophen (NORCO/VICODIN) 5-325 MG tablet Take 1 tablet by mouth every 4 (four) hours as needed. 09/16/21  Yes Ward, Layla Maw, DO  hydrOXYzine (ATARAX) 25 MG tablet Take 25 mg by mouth daily. At noon   Yes [provider]  hydrOXYzine (ATARAX) 50 MG tablet Take 50 mg by mouth in the morning and at bedtime.   Yes [provider]  ibuprofen (ADVIL) 800 MG tablet Take 800 mg by mouth every 8 (eight) hours as needed.   Yes [provider]  INVEGA 9 MG 24 hr tablet Take 9 mg by mouth every  morning. 07/21/21  Yes [provider]  lithium carbonate (LITHOBID) 300 MG CR tablet Take 600 mg by mouth at bedtime as needed.   Yes [provider]  loratadine (CLARITIN) 10 MG tablet Take 10 mg by mouth daily. 06/16/21  Yes [provider]  melatonin 3 MG TABS tablet Take 3 mg by mouth at bedtime. 06/08/21  Yes [provider]  metoprolol tartrate (LOPRESSOR) 100 MG tablet Take 1 tablet (100 mg total) by mouth 2 (two) times daily. 01/26/22  Yes End, Cristal Deer, MD  naproxen (NAPROSYN) 500 MG tablet Take 1 tablet (500 mg total) by mouth 2 (two) times daily with a meal. 10/07/21  Yes Jene Every, MD  omeprazole (PRILOSEC) 20 MG capsule Take 20 mg by mouth daily.   Yes [provider]  potassium chloride SA (KLOR-CON M) 20 MEQ tablet Take 1 tablet (20 mEq total) by mouth daily for 7 days. 10/29/21  Yes Carrie Mew, MD  promethazine (PHENERGAN) 25 MG tablet Take 25 mg by mouth every 6 (six) hours as needed for nausea or vomiting.   Yes [provider]  sacubitril-valsartan (ENTRESTO) 24-26 MG Take 1 tablet by mouth 2 (two) times daily. 10/27/21  Yes Darylene Price A, FNP  tamsulosin (FLOMAX) 0.4 MG CAPS capsule Take 1 capsule (0.4 mg total) by mouth daily. 09/10/21  Yes Lorella Nimrod, MD  traMADol (ULTRAM) 50 MG tablet Take 50 mg by mouth every 6 (six) hours as needed.   Yes [provider]   Review of Systems  Constitutional:  Positive for fatigue. Negative for appetite change.  HENT:  Positive for congestion and rhinorrhea. Negative for sore throat.   Eyes: Negative.   Respiratory:  Positive for apnea and shortness of breath. Negative for cough, chest tightness and wheezing.        + snoring  Cardiovascular:  Positive for leg swelling (improving). Negative for chest pain and palpitations.  Gastrointestinal:  Negative for abdominal distention and abdominal pain.  Endocrine: Negative.   Musculoskeletal:  Positive for arthralgias  (left knee) and back pain. Negative for neck pain.  Skin: Negative.   Allergic/Immunologic: Negative.   Neurological:  Negative for dizziness, tremors and light-headedness.  Hematological:  Negative for adenopathy. Does not bruise/bleed easily.  Psychiatric/Behavioral:  Negative for dysphoric mood and sleep disturbance (sleeping on 3 pillows). The patient is not nervous/anxious.     Vitals:   02/24/22 1040  BP: 139/82  Pulse: 86  Resp: 20  SpO2: 92%  Weight: (!) 384 lb (174.2 kg)  Height: 6\' 5"  (1.956 m)   Wt Readings from Last 3 Encounters:  02/24/22 (!) 384 lb (174.2 kg)  02/14/22 99 lb 3.3 oz (45 kg)  02/09/22 (!) 383 lb (173.7 kg)    Lab Results  Component Value Date   CREATININE 1.00 02/14/2022   CREATININE 0.96 02/07/2022   CREATININE 1.30 (H) 01/26/2022   Physical Exam Vitals and nursing note reviewed. Exam conducted with a chaperone present (caregiver).  Constitutional:      Appearance: Normal appearance.  HENT:     Head: Normocephalic and atraumatic.  Cardiovascular:     Rate and Rhythm: Normal rate and regular rhythm.  Pulmonary:     Effort: Pulmonary effort is normal. No respiratory distress.     Breath sounds: No wheezing, rhonchi or rales.  Abdominal:     General: There is no distension.     Palpations: Abdomen is soft.  Musculoskeletal:        General: No tenderness.     Cervical back: Normal range of motion.     Right lower leg: Edema (1+ pitting) present.     Left lower leg: Edema (1+ pitting) present.  Skin:    General: Skin is warm and dry.  Neurological:     Mental Status: He is alert and oriented to person, place, and time. Mental status is at baseline.  Psychiatric:        Attention and Perception: Attention normal.        Mood and Affect: Mood is not anxious or depressed. Affect is not flat.        Behavior: Behavior is not agitated.     Comments: Has a smile on his face interactive today   Assessment & Plan:  1: Chronic heart failure  with mildly reduced ejection fraction- - NYHA class II - euvolemic - weighing daily; reminded to call for an overnight weight gain of > 2 pounds or a weekly weight gain of > 5 pounds - weight down 17 pounds from last visit here 2 months ago - he says that he's trying to eat better and rewards himself with ice cream but only on Fridays - hoping to start at the [facility] next week; cautioned to start slowly - not adding salt and says that he doesn't add salt to his food  - emphasized keeping daily fluid intake to 60 ounces daily & rationale for this explained - on GDMT of entresto and farxiga - BNP 02/07/22 was 56.8  2: HTN- - BP looks good (139/82) - sees PCP at the group home  - BMP 02/14/22 reviewed and showed sodium 138, potassium 4.0, creatinine 1.00 and GFR >60  3: Persistent atrial fibrillation- - saw cardiology (End) 02/09/22 - HR regular today  4: Schizophrenia- - on invega & lithium - caregiver reports that patient's mental health is "much better"  5: Sleep apnea- - saw pulmonology Landry Mellow) 11/08/21; returns 03/23/22 - was supposed to have gotten his sleep study but wasn't being cooperative so he had to leave - wearing oxygen at 4L around the clock    Medication list reviewed.   Return in 3 months, sooner if needed.

## 2022-03-17 ENCOUNTER — Emergency Department: Payer: Medicaid Other

## 2022-03-17 ENCOUNTER — Emergency Department
Admission: EM | Admit: 2022-03-17 | Discharge: 2022-03-18 | Disposition: A | Discharge status: Home / Self Care | Payer: Medicaid Other | Attending: Emergency Medicine | Admitting: Emergency Medicine

## 2022-03-17 ENCOUNTER — Other Ambulatory Visit: Payer: Self-pay

## 2022-03-17 ENCOUNTER — Encounter: Payer: Self-pay | Admitting: Emergency Medicine

## 2022-03-17 DIAGNOSIS — I509 Heart failure, unspecified: Secondary | ICD-10-CM | POA: Insufficient documentation

## 2022-03-17 DIAGNOSIS — R079 Chest pain, unspecified: Secondary | ICD-10-CM | POA: Diagnosis not present

## 2022-03-17 DIAGNOSIS — R300 Dysuria: Secondary | ICD-10-CM | POA: Diagnosis present

## 2022-03-17 DIAGNOSIS — R109 Unspecified abdominal pain: Secondary | ICD-10-CM | POA: Diagnosis not present

## 2022-03-17 DIAGNOSIS — Z7901 Long term (current) use of anticoagulants: Secondary | ICD-10-CM | POA: Diagnosis not present

## 2022-03-17 DIAGNOSIS — I110 Hypertensive heart disease with heart failure: Secondary | ICD-10-CM | POA: Insufficient documentation

## 2022-03-17 LAB — BASIC METABOLIC PANEL
Anion gap: 12 (ref 5–15)
BUN: 15 mg/dL (ref 6–20)
CO2: 29 mmol/L (ref 22–32)
Calcium: 9 mg/dL (ref 8.9–10.3)
Chloride: 95 mmol/L — ABNORMAL LOW (ref 98–111)
Creatinine, Ser: 1.14 mg/dL (ref 0.61–1.24)
GFR, Estimated: >60 mL/min (ref >60)
Glucose, Bld: 101 mg/dL — ABNORMAL HIGH (ref 70–99)
Potassium: 3.9 mmol/L (ref 3.5–5.1)
Sodium: 136 mmol/L (ref 135–145)

## 2022-03-17 LAB — URINALYSIS, COMPLETE (UACMP) WITH MICROSCOPIC
Bacteria, UA: NONE SEEN
Bilirubin Urine: NEGATIVE
Glucose, UA: 150 mg/dL — AB
Hgb urine dipstick: NEGATIVE
Ketones, ur: NEGATIVE mg/dL
Nitrite: NEGATIVE
Protein, ur: 30 mg/dL — AB
Specific Gravity, Urine: 1.026 (ref 1.005–1.030)
WBC, UA: >50 WBC/hpf — ABNORMAL HIGH (ref 0–5)
pH: 5 (ref 5.0–8.0)

## 2022-03-17 LAB — CBC
HCT: 42.8 % (ref 39.0–52.0)
Hemoglobin: 13.6 g/dL (ref 13.0–17.0)
MCH: 28.4 pg (ref 26.0–34.0)
MCHC: 31.8 g/dL (ref 30.0–36.0)
MCV: 89.4 fL (ref 80.0–100.0)
Platelets: 227 10*3/uL (ref 150–400)
RBC: 4.79 MIL/uL (ref 4.22–5.81)
RDW: 12.9 % (ref 11.5–15.5)
WBC: 8.6 10*3/uL (ref 4.0–10.5)
nRBC: 0 % (ref 0.0–0.2)

## 2022-03-17 LAB — TROPONIN I (HIGH SENSITIVITY)
Troponin I (High Sensitivity): 3 ng/L (ref <18)
Troponin I (High Sensitivity): 4 ng/L (ref <18)

## 2022-03-17 MED ORDER — CEPHALEXIN 500 MG PO CAPS: 500.0000 mg | ORAL_CAPSULE | Freq: Four times a day (QID) | ORAL | 0 refills | Status: AC

## 2022-03-17 MED ORDER — IOHEXOL 350 MG/ML SOLN
100.0000 mL | Freq: Once | INTRAVENOUS | Status: AC | PRN
  Administered 2022-03-17: 100 mL via INTRAVENOUS

## 2022-03-17 MED ORDER — IOHEXOL 300 MG/ML  SOLN: 125.0000 mL | Freq: Once | INTRAMUSCULAR | Status: DC | PRN

## 2022-03-17 MED ORDER — SODIUM CHLORIDE 0.9 % IV SOLN
1.0000 g | Freq: Once | INTRAVENOUS | Status: AC
  Administered 2022-03-17: 1 g via INTRAVENOUS
  Filled 2022-03-17: qty 10

## 2022-03-17 MED ORDER — ONDANSETRON HCL 4 MG/2ML IJ SOLN
4.0000 mg | Freq: Once | INTRAMUSCULAR | Status: AC
  Administered 2022-03-17: 4 mg via INTRAVENOUS
  Filled 2022-03-17: qty 2

## 2022-03-17 MED ORDER — MORPHINE SULFATE (PF) 4 MG/ML IV SOLN
4.0000 mg | Freq: Once | INTRAVENOUS | Status: AC
  Administered 2022-03-17: 4 mg via INTRAVENOUS
  Filled 2022-03-17: qty 1

## 2022-03-17 NOTE — Discharge Instructions (Signed)
   Thank you for choosing us for your health care today!  Please see your primary doctor this week for a follow up appointment.   If you do not have a primary doctor call the following clinics to establish care:  If you have insurance:  [facility] [phone] [address] [place] [address]   [facility]  [phone] [address] [place] [address]   If you do not have insurance:  [facility]  [phone] [address] [place] [address]  Sometimes, in the early stages of certain disease courses it is difficult to detect in the emergency department evaluation -- so, it is important that you continue to monitor your symptoms and call your doctor right away or return to the emergency department if you develop any new or worsening symptoms.  It was my pleasure to care for you today.   Yvette Roark S. Jamonica Schoff, MD  

## 2022-03-17 NOTE — ED Provider Notes (Signed)
[facility] Provider Note    Event Date/Time   First MD Initiated Contact with Patient 03/17/22 2202     (approximate)   History   Chest Pain   HPI  Daniel [name] is a 31 y.o. male   Past medical history of A-fib on Eliquis, schizoaffective, CHF, conversion disorder, hypertension hyperlipidemia, seizure disorder, urethral stricture with dilation in April 2020 who presents to the emergency department with multiple complaints.  In triage she was noted to complaint [place]  Then he complained of chest pain to the [facility] provider radiating to left arm.    He had initial labs and EKG done with flat troponins and normal EKG.  When I meet him in the emergency department he is complaining of bilateral flank pain and dysuria.  He has leukocytes in his urine.  I ordered CT scan of his abdomen pelvis to assess for his flank/abdominal pain.  History was obtained via patient      Physical Exam   Triage Vital Signs: ED Triage Vitals  Enc Vitals Group     BP 03/17/22 2002 125/81     Pulse Rate 03/17/22 2002 74     Resp 03/17/22 2002 20     Temp 03/17/22 2002 98.7 F (37.1 C)     Temp Source 03/17/22 2002 Oral     SpO2 03/17/22 2002 98 %     Weight --      Height --      Head Circumference --      Peak Flow --      Pain Score 03/17/22 1602 7     Pain Loc --      Pain Edu? --      Excl. in GC? --     Most recent vital signs: Vitals:   03/17/22 2002  BP: 125/81  Pulse: 74  Resp: 20  Temp: 98.7 F (37.1 C)  SpO2: 98%    General: Awake, no distress.  CV:  Good peripheral perfusion.  Resp:  Normal effort. Clear lungs Abd:  No distention. Non tender Other:  Non toxic   ED Results / Procedures / Treatments   Labs (all labs ordered are listed, but only abnormal results are displayed) Labs Reviewed  BASIC METABOLIC PANEL - Abnormal; Notable for the following components:      Result Value    Chloride 95 (*)    Glucose, Bld 101 (*)    All other components within normal limits  URINALYSIS, COMPLETE (UACMP) WITH MICROSCOPIC - Abnormal; Notable for the following components:   Color, Urine YELLOW (*)    APPearance CLOUDY (*)    Glucose, UA 150 (*)    Protein, ur 30 (*)    Leukocytes,Ua LARGE (*)    WBC, UA >50 (*)    All other components within normal limits  URINE CULTURE  CBC  TROPONIN I (HIGH SENSITIVITY)  TROPONIN I (HIGH SENSITIVITY)     I reviewed labs and they are notable for leuks in urine  RADIOLOGY I independently reviewed and interpreted cT of the abdomen pelvis and see no infectious or inflammatory or obstructive patterns   PROCEDURES:  Critical Care performed: No  Procedures   MEDICATIONS ORDERED IN ED: Medications  cefTRIAXone (ROCEPHIN) 1 g in sodium chloride 0.9 % 100 mL IVPB (1 g Intravenous New Bag/Given 03/17/22 2333)  morphine (PF) 4 MG/ML injection 4 mg (4 mg Intravenous Given 03/17/22 2255)  ondansetron (  ZOFRAN) injection 4 mg (4 mg Intravenous Given 03/17/22 2256)  iohexol (OMNIPAQUE) 350 MG/ML injection 100 mL (100 mLs Intravenous Contrast Given 03/17/22 2314)     IMPRESSION / MDM / ASSESSMENT AND PLAN / ED COURSE  I reviewed the triage vital signs and the nursing notes.                              Differential diagnosis includes, but is not limited to, ACS, PE, dissection, respiratory infection, arrhythmia, intra-abdominal infection or renal stone    MDM: Patient with changing complaints since being in the emergency department.  When I interviewed the patient his only complaint is bilateral flank pain and dysuria.  My evaluation for this chief complaint shows leukocytes in urine and in normal CT scan of the abdomen pelvis and this patient appears nontoxic and comfortable.  Plan for antibiotics and PMD follow-up.   Patient's presentation is most consistent with acute presentation with potential threat to life or bodily  function.       FINAL CLINICAL IMPRESSION(S) / ED DIAGNOSES   Final diagnoses:  Dysuria  Chest pain, unspecified type     Rx / DC Orders   ED Discharge Orders          Ordered    cephALEXin (KEFLEX) 500 MG capsule  4 times daily        03/17/22 2352             Note:  This document was prepared using Dragon voice recognition software and may include unintentional dictation errors.    Lucillie Garfinkel, MD 03/18/22 Dyann Kief

## 2022-03-17 NOTE — ED Provider Triage Note (Signed)
Emergency Medicine Provider Triage Evaluation Note  Daniel [name] , a 31 y.o. male  was evaluated in triage.  Pt complains of chest pain that started earlier [place].  Denies having much appetite, does complain of some mild nausea.  Pain radiates down his left arm.  He denies any fevers, chills, cough, shortness of breath..  Review of Systems  Positive: Chest pain, nausea Negative: Short of breath, fevers, abdominal pain, dysuria  Physical Exam  There were no vitals taken for this visit. Gen:   Awake, no distress   Resp:  Normal effort  MSK:   Moves extremities without difficulty  Other:  No abdominal discomfort  Medical Decision Making  Medically screening exam initiated at 4:26 PM.  Appropriate orders placed.  Kael Keetch was informed that the remainder of the evaluation will be completed by another provider, this initial triage assessment does not replace that evaluation, and the importance of remaining in the ED until their evaluation is complete.     Duanne Guess, PA-C 03/17/22 1628

## 2022-03-17 NOTE — ED Triage Notes (Signed)
Pt reports HR of 147 at home.  Caregiver states he [place] from the [place] and his HR was up.

## 2022-03-19 LAB — URINE CULTURE: Culture: NO GROWTH

## 2022-03-22 NOTE — Progress Notes (Unsigned)
@Patient  ID: , male    DOB: 06/09/1990, 31 y.o.   MRN: [id number]  No chief complaint on file.   Referring provider: [id number], MD  HPI: 31 year old male, former smoker.  Past medical history significant for OSA.  03/23/2022 Patient presents today for sleep consult.            Allergies  Allergen Reactions   Haldol [Haloperidol] Other (See Comments)    SI   Dermatitis Antigen Rash   Meperidine     Meperidine   Abilify [Aripiprazole] Palpitations   Demerol [Meperidine Hcl] Hives   Tape Rash    Other reaction(s): Other (See Comments) Itching, skin tear    Immunization History  Administered Date(s) Administered   PFIZER Comirnaty(Gray Top)Covid-19 Tri-Sucrose Vaccine 09/30/2020   PFIZER(Purple Top)SARS-COV-2 Vaccination 04/28/2020   Tdap 02/07/2022    Past Medical History:  Diagnosis Date   Atrial fibrillation (HCC)    Cardiomyopathy (HCC)    a. 07/2021 Echo: EF 50%, mild LVH, nl RV size/function. No significant valvular disease.   Chest pain    CHF (congestive heart failure) (HCC)    Conversion disorder    Current use of long term anticoagulation    Diabetes mellitus without complication (HCC)    Hyperlipidemia    Hypertension    OSA (obstructive sleep apnea)    Persistent atrial fibrillation and flutter (HCC)    a. CHA2DS2VASc = 3-4   Schizoaffective disorder (HCC)    Seizure disorder (HCC)    TIA (transient ischemic attack)    Urethral stricture    a. 08/2021 s/p cystoscopy and urethral dilation.    Tobacco History: Social History   Tobacco Use  Smoking Status Former   Types: Cigarettes  Smokeless Tobacco Never   Counseling given: Not Answered   Outpatient Medications Prior to Visit  Medication Sig Dispense Refill   acetaminophen (TYLENOL) 650 MG suppository Place 1,300 mg rectally every 4 (four) hours as needed.     albuterol (VENTOLIN HFA) 108 (90 Base) MCG/ACT inhaler Inhale 2 puffs into the lungs every 6 (six)  hours as needed for wheezing.     allopurinol (ZYLOPRIM) 300 MG tablet Take 1 tablet (300 mg total) by mouth daily. 30 tablet 0   atorvastatin (LIPITOR) 80 MG tablet Take 80 mg by mouth daily.     baclofen (LIORESAL) 10 MG tablet Take 1 tablet (10 mg total) by mouth 2 (two) times daily as needed for muscle spasms. Home med. 30 each 0   benztropine (COGENTIN) 1 MG tablet Take 1 mg by mouth daily.     cephALEXin (KEFLEX) 500 MG capsule Take 1 capsule (500 mg total) by mouth 4 (four) times daily for 5 days. 20 capsule 0   cyclobenzaprine (FLEXERIL) 5 MG tablet Take 5 mg by mouth 3 (three) times daily as needed for muscle spasms.     dapagliflozin propanediol (FARXIGA) 10 MG TABS tablet Take 1 tablet (10 mg total) by mouth daily before breakfast. 90 tablet 3   diclofenac Sodium (VOLTAREN) 1 % GEL Apply 2 g topically 4 (four) times daily. 100 g 0   diltiazem (CARDIZEM CD) 360 MG 24 hr capsule Take 1 capsule (360 mg total) by mouth daily. 30 capsule 1   divalproex (DEPAKOTE) 500 MG DR tablet Take 2 tablets (1,000 mg total) by mouth 2 (two) times daily.     ELIQUIS 5 MG TABS tablet Take 5 mg by mouth 2 (two) times daily.     escitalopram (LEXAPRO) 10 MG  tablet Take 20 mg by mouth daily.     fluticasone (FLONASE) 50 MCG/ACT nasal spray Place 2 sprays into both nostrils 2 (two) times daily.     furosemide (LASIX) 20 MG tablet Take 1 tablet (20 mg total) by mouth daily. 30 tablet 0   HYDROcodone-acetaminophen (NORCO/VICODIN) 5-325 MG tablet Take 1 tablet by mouth every 4 (four) hours as needed. 15 tablet 0   hydrOXYzine (ATARAX) 25 MG tablet Take 25 mg by mouth daily. At noon     hydrOXYzine (ATARAX) 50 MG tablet Take 50 mg by mouth in the morning and at bedtime.     ibuprofen (ADVIL) 800 MG tablet Take 800 mg by mouth every 8 (eight) hours as needed.     INVEGA 9 MG 24 hr tablet Take 9 mg by mouth every morning.     lithium carbonate (LITHOBID) 300 MG CR tablet Take 600 mg by mouth at bedtime as needed.      loratadine (CLARITIN) 10 MG tablet Take 10 mg by mouth daily.     melatonin 3 MG TABS tablet Take 3 mg by mouth at bedtime.     metoprolol tartrate (LOPRESSOR) 100 MG tablet Take 1 tablet (100 mg total) by mouth 2 (two) times daily. 180 tablet 1   naproxen (NAPROSYN) 500 MG tablet Take 1 tablet (500 mg total) by mouth 2 (two) times daily with a meal. 20 tablet 2   omeprazole (PRILOSEC) 20 MG capsule Take 20 mg by mouth daily.     potassium chloride SA (KLOR-CON M) 20 MEQ tablet Take 1 tablet (20 mEq total) by mouth daily for 7 days. 7 tablet 0   promethazine (PHENERGAN) 25 MG tablet Take 25 mg by mouth every 6 (six) hours as needed for nausea or vomiting.     sacubitril-valsartan (ENTRESTO) 24-26 MG Take 1 tablet by mouth 2 (two) times daily. 60 tablet 3   tamsulosin (FLOMAX) 0.4 MG CAPS capsule Take 1 capsule (0.4 mg total) by mouth daily. 30 capsule 2   traMADol (ULTRAM) 50 MG tablet Take 50 mg by mouth every 6 (six) hours as needed.     No facility-administered medications prior to visit.      Review of Systems  Review of Systems   Physical Exam  There were no vitals taken for this visit. Physical Exam   Lab Results:  CBC    Component Value Date/Time   WBC 8.6 03/17/2022 1733   RBC 4.79 03/17/2022 1733   HGB 13.6 03/17/2022 1733   HGB 11.9 (L) 04/29/2013 0838   HCT 42.8 03/17/2022 1733   HCT 36.0 (L) 04/29/2013 0838   PLT 227 03/17/2022 1733   PLT 257 04/29/2013 0838   MCV 89.4 03/17/2022 1733   MCV 80 04/29/2013 0838   MCH 28.4 03/17/2022 1733   MCHC 31.8 03/17/2022 1733   RDW 12.9 03/17/2022 1733   RDW 14.1 04/29/2013 0838   LYMPHSABS 1.6 02/07/2022 1559   MONOABS 0.6 02/07/2022 1559   EOSABS 0.2 02/07/2022 1559   BASOSABS 0.0 02/07/2022 1559    BMET    Component Value Date/Time   NA 136 03/17/2022 1733   NA 136 04/29/2013 0838   K 3.9 03/17/2022 1733   K 4.0 04/29/2013 0838   CL 95 (L) 03/17/2022 1733   CL 106 04/29/2013 0838   CO2 29 03/17/2022  1733   CO2 25 04/29/2013 0838   GLUCOSE 101 (H) 03/17/2022 1733   GLUCOSE 105 (H) 04/29/2013 0838   BUN 15  03/17/2022 1733   BUN 15 04/29/2013 0838   CREATININE 1.14 03/17/2022 1733   CREATININE 0.99 04/29/2013 0838   CALCIUM 9.0 03/17/2022 1733   CALCIUM 9.2 04/29/2013 0838   GFRNONAA >60 03/17/2022 1733   GFRNONAA >60 04/29/2013 0838   GFRAA >60 04/29/2013 0838    BNP    Component Value Date/Time   BNP 56.8 02/07/2022 1559    ProBNP No results found for: "PROBNP"  Imaging: CT Abdomen Pelvis W Contrast  Result Date: 03/17/2022 CLINICAL DATA:  Flank pain EXAM: CT ABDOMEN AND PELVIS WITH CONTRAST TECHNIQUE: Multidetector CT imaging of the abdomen and pelvis was performed using the standard protocol following bolus administration of intravenous contrast. RADIATION DOSE REDUCTION: This exam was performed according to the departmental dose-optimization program which includes automated exposure control, adjustment of the mA and/or kV according to patient size and/or use of iterative reconstruction technique. CONTRAST:  OMNIPAQUE IOHEXOL 350 MG/ML SOLN COMPARISON:  CT 12/19/2021 FINDINGS: Lower chest: No acute abnormality. Hepatobiliary: No focal liver abnormality is seen. No gallstones, gallbladder wall thickening, or biliary dilatation. Hepatic steatosis. Pancreas: Unremarkable. No pancreatic ductal dilatation or surrounding inflammatory changes. Spleen: Normal in size without focal abnormality. Adrenals/Urinary Tract: Adrenal glands are unremarkable. Kidneys are normal, without renal calculi, focal lesion, or hydronephrosis. Bladder is unremarkable. Stomach/Bowel: Stomach is within normal limits. Postsurgical changes at the cecum suggesting prior appendectomy. No evidence of bowel wall thickening, distention, or inflammatory changes. Vascular/Lymphatic: No significant vascular findings are present. No enlarged abdominal or pelvic lymph nodes. Reproductive: Prostate is unremarkable.  Other: No abdominal wall hernia or abnormality. No abdominopelvic ascites. Musculoskeletal: No acute or significant osseous findings. IMPRESSION: 1. No CT evidence for acute intra-abdominal or pelvic abnormality. 2. Hepatic steatosis Electronically Signed   By: Jasmine Pang M.D.   On: 03/17/2022 23:18   DG Chest 2 View  Result Date: 03/17/2022 CLINICAL DATA:  Chest pain and tachycardia EXAM: CHEST - 2 VIEW COMPARISON:  Chest radiograph dated 02/07/2022 FINDINGS: Normal lung volumes. No focal consolidations. No pleural effusion or pneumothorax. The heart size and mediastinal contours are within normal limits. The visualized skeletal structures are unremarkable. IMPRESSION: No focal consolidations. Electronically Signed   By: Agustin Cree M.D.   On: 03/17/2022 17:21     Assessment & Plan:   No problem-specific Assessment & Plan notes found for this encounter.     Glenford Bayley, NP 03/22/2022

## 2022-03-23 ENCOUNTER — Ambulatory Visit (INDEPENDENT_AMBULATORY_CARE_PROVIDER_SITE_OTHER): Payer: Medicaid Other | Admitting: Primary Care

## 2022-03-23 ENCOUNTER — Encounter: Payer: Self-pay | Admitting: Primary Care

## 2022-03-23 VITALS — BP 136/78 | HR 93 | Temp 98.1°F | Ht 77.0 in | Wt 382.4 lb

## 2022-03-23 DIAGNOSIS — R29818 Other symptoms and signs involving the nervous system: Secondary | ICD-10-CM

## 2022-03-23 DIAGNOSIS — R0683 Snoring: Secondary | ICD-10-CM

## 2022-03-23 DIAGNOSIS — J9611 Chronic respiratory failure with hypoxia: Secondary | ICD-10-CM | POA: Diagnosis not present

## 2022-03-23 DIAGNOSIS — I482 Chronic atrial fibrillation, unspecified: Secondary | ICD-10-CM

## 2022-03-23 MED ORDER — ALBUTEROL SULFATE HFA 108 (90 BASE) MCG/ACT IN AERS: 2.0000 | INHALATION_SPRAY | Freq: Four times a day (QID) | RESPIRATORY_TRACT | 2 refills | Status: DC | PRN

## 2022-03-23 NOTE — Assessment & Plan Note (Signed)
-   Encourage weight loss efforts  ?

## 2022-03-23 NOTE — Patient Instructions (Signed)
  Sleep apnea is defined as period of 10 seconds or longer when you stop breathing at night. This can happen multiple times a night. Dx sleep apnea is when this occurs more than 5 times an hour.    Mild OSA 5-15 apneic events an hour Moderate OSA 15-30 apneic events an hour Severe OSA > 30 apneic events an hour   Untreated sleep apnea puts you at higher risk for cardiac arrhythmias, pulmonary HTN, stroke and diabetes   Treatment options include weight loss, side sleeping position, oral appliance, CPAP therapy or referral to ENT for possible surgical options    Recommendations: Focus on side sleeping position or elevate head with wedge pillow 30 degrees Work on weight loss efforts if able  Do not drive if experiencing excessive daytime sleepiness of fatigue    Orders: In lab sleep study re: loud snoring    Follow-up: Please call to schedule follow-up 1-2 weeks after completing home sleep study to review results and treatment if needed (can be virtual)

## 2022-03-23 NOTE — Assessment & Plan Note (Signed)
-   Patient has symptoms of loud snoring and witnessed apneas along with hx afib. I have a strong suspicion patient has underlying sleep apnea. He has never had a sleep study. He is on oxygen for chronic respiratory failure. He needs in-lab split night sleep study to establish OSA diagnosis and correct Pap pressure settings. Discussed risks of untreated sleep apnea including cardiac arrhythmias, pulm HT, stroke or DM. We reviewed treatment options including weight loss, oral appliance, CPAP or referral to ENT for possible surgical options. Advised patient to focus on side sleeping position or elevated head of bed at night. FU 1-2 weeks after sleep study to review results and treatment options if needed.

## 2022-03-23 NOTE — Assessment & Plan Note (Signed)
-   He has been on oxygen since being hospitalized for covid last year  - Currently being worked up for obstructive sleep apnea/OHS diagnosis  - Continue 4L oxygen continuously to maintain O2 >88-90%

## 2022-03-23 NOTE — Progress Notes (Signed)
Reviewed and agree with assessment/plan.   Chesley Mires, MD [facility] Pulmonary/Critical Care 03/23/2022, 1:17 PM Pager:  [phone]

## 2022-03-23 NOTE — Assessment & Plan Note (Addendum)
-   Regular rate and rhytm today on exam - Continue metoprolol, diltiazem, Eliquis

## 2022-04-25 ENCOUNTER — Emergency Department
Admission: EM | Admit: 2022-04-25 | Discharge: 2022-04-25 | Disposition: A | Discharge status: Home / Self Care | Payer: Medicaid Other | Attending: Emergency Medicine | Admitting: Emergency Medicine

## 2022-04-25 ENCOUNTER — Encounter: Payer: Self-pay | Admitting: Emergency Medicine

## 2022-04-25 ENCOUNTER — Other Ambulatory Visit: Payer: Self-pay

## 2022-04-25 DIAGNOSIS — N3000 Acute cystitis without hematuria: Secondary | ICD-10-CM | POA: Insufficient documentation

## 2022-04-25 DIAGNOSIS — R350 Frequency of micturition: Secondary | ICD-10-CM | POA: Diagnosis present

## 2022-04-25 MED ORDER — CEPHALEXIN 500 MG PO CAPS: 500.0000 mg | ORAL_CAPSULE | Freq: Two times a day (BID) | ORAL | 0 refills | Status: DC

## 2022-04-25 NOTE — ED Provider Notes (Signed)
   [facility] Provider Note    Event Date/Time   First MD Initiated Contact with Patient 04/25/22 1056     (approximate)   History   Urinary Frequency   HPI  Daniel [name] is a 31 y.o. male with extensive past medical history as detailed in the chart who presents with complaints of urinary frequency.  Patient is describes dysuria, some mild back pain as well.     Physical Exam   Triage Vital Signs: ED Triage Vitals [04/25/22 1059]  Enc Vitals Group     BP (!) 135/93     Pulse Rate 80     Resp 18     Temp 98.7 F (37.1 C)     Temp Source Oral     SpO2 94 %     Weight      Height      Head Circumference      Peak Flow      Pain Score 7     Pain Loc      Pain Edu?      Excl. in [place]?     Most recent vital signs: Vitals:   04/25/22 1059  BP: (!) 135/93  Pulse: 80  Resp: 18  Temp: 98.7 F (37.1 C)  SpO2: 94%     General: Awake, no distress.  CV:  Good peripheral perfusion.  Regular rate and rhythm Resp:  Normal effort.  Abd:  No distention.  No CVA tenderness Other:     ED Results / Procedures / Treatments   Labs (all labs ordered are listed, but only abnormal results are displayed) Labs Reviewed - No data to display   EKG  ED ECG REPORT I, Jene Every, the attending physician, personally viewed and interpreted this ECG.  Date: 04/25/2022  Rhythm: normal sinus rhythm QRS Axis: normal Intervals: normal ST/T Wave abnormalities: Nonspecific changes Narrative Interpretation: no evidence of acute ischemia    RADIOLOGY     PROCEDURES:  Critical Care performed:   Procedures   MEDICATIONS ORDERED IN ED: Medications - No data to display   IMPRESSION / MDM / ASSESSMENT AND PLAN / ED COURSE  I reviewed the triage vital signs and the nursing notes. Patient's presentation is most consistent with acute, uncomplicated illness.  Patient is well-known to our emergency department, presents with complaints of  dysuria, he has had urinary tract infections in the past.  He is well-appearing afebrile with a normal heart rate.  No CVA tenderness to suggest pyelonephritis.  He is quite comfortable and has no complaints at this time.  He denies chest pain to me although he had currently noted that to front desk.  EKG was checked not significantly changed from prior and again the patient is denying chest pain.  Will Rx antibiotics, appropriate discharge at this time, outpatient follow-up as needed.        FINAL CLINICAL IMPRESSION(S) / ED DIAGNOSES   Final diagnoses:  Acute cystitis without hematuria     Rx / DC Orders   ED Discharge Orders          Ordered    cephALEXin (KEFLEX) 500 MG capsule  2 times daily        04/25/22 1057             Note:  This document was prepared using Dragon voice recognition software and may include unintentional dictation errors.   Jene Every, MD 04/25/22 1105

## 2022-04-25 NOTE — ED Triage Notes (Signed)
Patient to ED via POV for possible UTI. Patient states it burns when he urinates and back pain.  [name], MD evaluated patient in triage.

## 2022-04-25 NOTE — ED Notes (Signed)
Legal guardian aware of patient at ER and being discharged. Group home called to pick up patient for discharge.

## 2022-04-29 ENCOUNTER — Emergency Department: Payer: Medicaid Other

## 2022-04-29 ENCOUNTER — Emergency Department
Admission: EM | Admit: 2022-04-29 | Discharge: 2022-04-29 | Disposition: A | Discharge status: Home / Self Care | Payer: Medicaid Other | Attending: Emergency Medicine | Admitting: Emergency Medicine

## 2022-04-29 ENCOUNTER — Other Ambulatory Visit: Payer: Self-pay

## 2022-04-29 DIAGNOSIS — R059 Cough, unspecified: Secondary | ICD-10-CM | POA: Diagnosis present

## 2022-04-29 DIAGNOSIS — U071 COVID-19: Secondary | ICD-10-CM | POA: Insufficient documentation

## 2022-04-29 DIAGNOSIS — I4891 Unspecified atrial fibrillation: Secondary | ICD-10-CM | POA: Insufficient documentation

## 2022-04-29 DIAGNOSIS — Z7901 Long term (current) use of anticoagulants: Secondary | ICD-10-CM | POA: Insufficient documentation

## 2022-04-29 DIAGNOSIS — J069 Acute upper respiratory infection, unspecified: Secondary | ICD-10-CM | POA: Insufficient documentation

## 2022-04-29 DIAGNOSIS — M10071 Idiopathic gout, right ankle and foot: Secondary | ICD-10-CM | POA: Diagnosis not present

## 2022-04-29 DIAGNOSIS — M109 Gout, unspecified: Secondary | ICD-10-CM

## 2022-04-29 LAB — COMPREHENSIVE METABOLIC PANEL
ALT: 19 U/L (ref 0–44)
AST: 22 U/L (ref 15–41)
Albumin: 3.6 g/dL (ref 3.5–5.0)
Alkaline Phosphatase: 66 U/L (ref 38–126)
Anion gap: 9 (ref 5–15)
BUN: 12 mg/dL (ref 6–20)
CO2: 28 mmol/L (ref 22–32)
Calcium: 8.8 mg/dL — ABNORMAL LOW (ref 8.9–10.3)
Chloride: 98 mmol/L (ref 98–111)
Creatinine, Ser: 0.84 mg/dL (ref 0.61–1.24)
GFR, Estimated: >60 mL/min (ref >60)
Glucose, Bld: 120 mg/dL — ABNORMAL HIGH (ref 70–99)
Potassium: 3.8 mmol/L (ref 3.5–5.1)
Sodium: 135 mmol/L (ref 135–145)
Total Bilirubin: 0.6 mg/dL (ref 0.3–1.2)
Total Protein: 7 g/dL (ref 6.5–8.1)

## 2022-04-29 LAB — TROPONIN I (HIGH SENSITIVITY): Troponin I (High Sensitivity): 3 ng/L (ref <18)

## 2022-04-29 LAB — CBC WITH DIFFERENTIAL/PLATELET
Abs Immature Granulocytes: 0.06 10*3/uL (ref 0.00–0.07)
Basophils Absolute: 0 10*3/uL (ref 0.0–0.1)
Basophils Relative: 1 %
Eosinophils Absolute: 0 10*3/uL (ref 0.0–0.5)
Eosinophils Relative: 1 %
HCT: 39 % (ref 39.0–52.0)
Hemoglobin: 12.3 g/dL — ABNORMAL LOW (ref 13.0–17.0)
Immature Granulocytes: 1 %
Lymphocytes Relative: 17 %
Lymphs Abs: 1 10*3/uL (ref 0.7–4.0)
MCH: 28 pg (ref 26.0–34.0)
MCHC: 31.5 g/dL (ref 30.0–36.0)
MCV: 88.8 fL (ref 80.0–100.0)
Monocytes Absolute: 0.8 10*3/uL (ref 0.1–1.0)
Monocytes Relative: 14 %
Neutro Abs: 4 10*3/uL (ref 1.7–7.7)
Neutrophils Relative %: 66 %
Platelets: 200 10*3/uL (ref 150–400)
RBC: 4.39 MIL/uL (ref 4.22–5.81)
RDW: 13.2 % (ref 11.5–15.5)
WBC: 6 10*3/uL (ref 4.0–10.5)
nRBC: 0 % (ref 0.0–0.2)

## 2022-04-29 LAB — SARS CORONAVIRUS 2 BY RT PCR: SARS Coronavirus 2 by RT PCR: POSITIVE — AB

## 2022-04-29 MED ORDER — COLCHICINE 0.6 MG PO TABS
1.2000 mg | ORAL_TABLET | Freq: Once | ORAL | Status: AC
  Administered 2022-04-29: 1.2 mg via ORAL
  Filled 2022-04-29: qty 2

## 2022-04-29 MED ORDER — COLCHICINE 0.6 MG PO TABS: 0.6000 mg | ORAL_TABLET | Freq: Every day | ORAL | 0 refills | Status: DC

## 2022-04-29 NOTE — ED Triage Notes (Signed)
Pt now stating he is on keflex for a UTI.

## 2022-04-29 NOTE — ED Provider Notes (Signed)
[facility] Provider Note    Event Date/Time   First MD Initiated Contact with Patient 04/29/22 2245     (approximate)   History   Sore Throat, Chest Pain, and Cough   HPI  Daniel [name] is a 31 y.o. male with past medical history significant for chronic oxygen use on 2 L, atrial fibrillation on Eliquis, who presents to the emergency department with COVID and shortness of breath.  Patient [name] is shortness of breath and cough that started today.  States that he has not been feeling well.  Also complaining of pain to his right great toe and states that it is consistent with gout flare.  Denies any nausea or vomiting.  States that he has been compliant with his Eliquis.  Lives in a home.     Physical Exam   Triage Vital Signs: ED Triage Vitals  Enc Vitals Group     BP 04/29/22 2039 111/85     Pulse Rate 04/29/22 2039 84     Resp 04/29/22 2039 20     Temp 04/29/22 2039 99.4 F (37.4 C)     Temp Source 04/29/22 2039 Oral     SpO2 04/29/22 2039 90 %     Weight 04/29/22 2036 (!) 385 lb 12.9 oz (175 kg)     Height 04/29/22 2036 6\' 5"  (1.956 m)     Head Circumference --      Peak Flow --      Pain Score 04/29/22 2041 5     Pain Loc --      Pain Edu? --      Excl. in [place]? --     Most recent vital signs: Vitals:   04/29/22 2039  BP: 111/85  Pulse: 84  Resp: 20  Temp: 99.4 F (37.4 C)  SpO2: 90%    Physical Exam Constitutional:      Appearance: He is well-developed. He is obese.  HENT:     Head: Atraumatic.  Eyes:     Conjunctiva/sclera: Conjunctivae normal.  Cardiovascular:     Rate and Rhythm: Regular rhythm.  Pulmonary:     Effort: No respiratory distress.  Abdominal:     General: There is no distension.  Musculoskeletal:     Cervical back: Normal range of motion.     Comments: Right great toe with overlying erythema, tenderness to palpation.  No open wounds.  +2 pulses.  Skin:    General: Skin is warm.     Capillary Refill:  Capillary refill takes less than 2 seconds.  Neurological:     Mental Status: He is alert. Mental status is at baseline.          IMPRESSION / MDM / ASSESSMENT AND PLAN / ED COURSE  I reviewed the triage vital signs and the nursing notes.  Differential diagnosis including COVID, pneumonia, ACS, anemia, gout flare, cellulitis    EKG  I, Nathaniel Man, the attending physician, personally viewed and interpreted this ECG.   Rate: Normal  Rhythm: Normal sinus  Axis: Normal  Intervals: Normal  ST&T Change: T wave inverted to the inferior leads.  No significant change when compared to prior.  No tachycardic or bradycardic dysrhythmias while on cardiac telemetry.  RADIOLOGY I independently reviewed imaging, my interpretation of imaging: Chest x-ray with no focal findings consistent with pneumonia.  Read as no acute findings.      ED Results / Procedures / Treatments   Labs (all labs ordered are listed, but only  abnormal results are displayed) Labs interpreted as -  Anemia of 12.3.  Does not meet criteria for blood transfusion.  No significant leukocytosis.  Normal platelets.  Troponin negative.  COVID positive.  Labs Reviewed  SARS CORONAVIRUS 2 BY RT PCR - Abnormal; Notable for the following components:      Result Value   SARS Coronavirus 2 by RT PCR POSITIVE (*)    All other components within normal limits  CBC WITH DIFFERENTIAL/PLATELET - Abnormal; Notable for the following components:   Hemoglobin 12.3 (*)    All other components within normal limits  COMPREHENSIVE METABOLIC PANEL - Abnormal; Notable for the following components:   Glucose, Bld 120 (*)    Calcium 8.8 (*)    All other components within normal limits  TROPONIN I (HIGH SENSITIVITY)  TROPONIN I (HIGH SENSITIVITY)    Clinical picture is not consistent with ACS.  Low suspicion for PE.  Patient maintaining an oxygen saturation of 97% on my evaluation.  No wheezing on my exam.  Patient is not a  candidate for Paxlovid given that he is on Eliquis with A-fib.  Given first dose of colchicine for gout flare.  Will give a short course of colchicine for the next 3 days.  Given return precautions.   PROCEDURES:  Critical Care performed: No  Procedures  Patient's presentation is most consistent with acute presentation with potential threat to life or bodily function.   MEDICATIONS ORDERED IN ED: Medications  colchicine tablet 1.2 mg (1.2 mg Oral Given 04/29/22 2259)    FINAL CLINICAL IMPRESSION(S) / ED DIAGNOSES   Final diagnoses:  Upper respiratory tract infection, unspecified type  COVID  Acute gout involving toe of right foot, unspecified cause     Rx / DC Orders   ED Discharge Orders          Ordered    colchicine 0.6 MG tablet  Daily        04/29/22 2257             Note:  This document was prepared using Dragon voice recognition software and may include unintentional dictation errors.   Corena Herter, MD 04/29/22 [phone]

## 2022-04-29 NOTE — Discharge Instructions (Signed)
You were seen in the emergency department and diagnosed with COVID.  You had a chest x-ray done that did not show any signs of a focal pneumonia.  Since you are on Eliquis you are not a good candidate for Paxlovid which is the outpatient treatment for COVID.  It is important that if you have worsening shortness of breath they return to the emergency department for reevaluation.  You also had complaints of gout flare to your right toe.  You were given your first dose of colchicine for gout flare in the emergency department.  You are given a prescription for colchicine.  If your toe is still hurting tomorrow you can take your next dose it is 1 dose for 3 days.  Follow-up with primary care provider.  Quarantine for 5 days.

## 2022-04-29 NOTE — ED Notes (Signed)
Extra blue and red top sent to lab

## 2022-04-29 NOTE — ED Notes (Signed)
Unable to contact legal guardian after two attempts. Called listed caregiver OJ Manson Passey who stated he will be able to pick up the patient within a half hour.

## 2022-04-29 NOTE — ED Triage Notes (Signed)
Pt sts that he woke up with a sore throat and has been coughing and started having chest pain and sob. Pt tested for covid this afternoon. Pt sts that he is supposed to be on oxygen but he ran out at home and did not bring any here.

## 2022-05-02 ENCOUNTER — Telehealth: Payer: Self-pay | Admitting: Primary Care

## 2022-05-02 NOTE — Telephone Encounter (Signed)
Ok, thank you

## 2022-05-02 NOTE — Telephone Encounter (Signed)
[name] saw Daniel [name] on 03/23/22 and placed order for split night sleep study. We received note from [facility] stating patient has not returned  their calls to schedule sleep study. They will not make any further attempts to schedule this patient

## 2022-05-13 ENCOUNTER — Ambulatory Visit: Payer: Medicaid Other | Admitting: Internal Medicine

## 2022-05-13 NOTE — Progress Notes (Deleted)
Follow-up Outpatient Visit Date: 05/13/2022  Primary Care Provider: Anselm Jungling, NP [address] [address] [facility]  [address]  Chief Complaint: ***  HPI:  [name] is a 31 y.o. male with history of cardiomyopathy with low normal to mildly reduced LVEF, persistent atrial fibrillation, questionable TIA, hypertension, hyperlipidemia, sleep disordered breathing, chronic respiratory failure with hypoxia, seizure disorder, conversion disorder, and schizoaffective disorder, who presents for follow-up of chest pain and atrial fibrillation.  I last saw him in mid September, at which time he was feeling well after having spontaneously converted back to sinus rhythm.  Chronic respiratory failure was stable; he continued to wear 4 L of supplemental oxygen most of the time.  We did not make any medication changes or pursue additional testing.  --------------------------------------------------------------------------------------------------  Past Medical History:  Diagnosis Date   Atrial fibrillation (HCC)    Cardiomyopathy (HCC)    a. 07/2021 Echo: EF 50%, mild LVH, nl RV size/function. No significant valvular disease.   Chest pain    CHF (congestive heart failure) (HCC)    Conversion disorder    Current use of long term anticoagulation    Diabetes mellitus without complication (HCC)    Hyperlipidemia    Hypertension    OSA (obstructive sleep apnea)    Persistent atrial fibrillation and flutter (HCC)    a. CHA2DS2VASc = 3-4   Schizoaffective disorder (HCC)    Seizure disorder (HCC)    TIA (transient ischemic attack)    Urethral stricture    a. 08/2021 s/p cystoscopy and urethral dilation.   Past Surgical History:  Procedure Laterality Date   CYSTOSCOPY WITH URETHRAL DILATATION N/A 09/07/2021   Procedure: CYSTOSCOPY WITH URETHRAL DILATATION CATHETER PLACEMENT;  Surgeon: Riki Altes, MD;  Location: [facility] ORS;  Service: Urology;  Laterality: N/A;    No outpatient medications have been  marked as taking for the 05/13/22 encounter (Appointment) with Allenmichael Mcpartlin, Cristal Deer, MD.    Allergies: Haldol [haloperidol], Dermatitis antigen, Meperidine, Abilify [aripiprazole], Demerol [meperidine hcl], and Tape  Social History   Tobacco Use   Smoking status: Former    Years: 2.00    Types: Cigarettes    Quit date: 08/2021    Years since quitting: 0.7   Smokeless tobacco: Never  Vaping Use   Vaping Use: Never used  Substance Use Topics   Alcohol use: Not Currently   Drug use: Never    No family history on file.  Review of Systems: A 12-system review of systems was performed and was negative except as noted in the HPI.  --------------------------------------------------------------------------------------------------  Physical Exam: There were no vitals taken for this visit.  General:  NAD. Neck: No JVD or HJR. Lungs: Clear to auscultation bilaterally without wheezes or crackles. Heart: Regular rate and rhythm without murmurs, rubs, or gallops. Abdomen: Soft, nontender, nondistended. Extremities: No lower extremity edema.  EKG:  ***  Lab Results  Component Value Date   WBC 6.0 04/29/2022   HGB 12.3 (L) 04/29/2022   HCT 39.0 04/29/2022   MCV 88.8 04/29/2022   PLT 200 04/29/2022    Lab Results  Component Value Date   NA 135 04/29/2022   K 3.8 04/29/2022   CL 98 04/29/2022   CO2 28 04/29/2022   BUN 12 04/29/2022   CREATININE 0.84 04/29/2022   GLUCOSE 120 (H) 04/29/2022   ALT 19 04/29/2022    Lab Results  Component Value Date   CHOL 133 08/29/2021   HDL 32 (L) 08/29/2021   LDLCALC 50 08/29/2021   TRIG  255 (H) 08/29/2021   CHOLHDL 4.2 08/29/2021    --------------------------------------------------------------------------------------------------  ASSESSMENT AND PLAN: Yvonne Kendall, MD 05/13/2022 7:48 AM

## 2022-05-21 ENCOUNTER — Other Ambulatory Visit: Payer: Self-pay

## 2022-05-21 ENCOUNTER — Emergency Department: Payer: Medicaid Other

## 2022-05-21 ENCOUNTER — Other Ambulatory Visit: Payer: Self-pay | Admitting: Family

## 2022-05-21 DIAGNOSIS — R531 Weakness: Secondary | ICD-10-CM | POA: Diagnosis present

## 2022-05-21 DIAGNOSIS — Z1152 Encounter for screening for COVID-19: Secondary | ICD-10-CM | POA: Insufficient documentation

## 2022-05-21 DIAGNOSIS — R202 Paresthesia of skin: Secondary | ICD-10-CM | POA: Insufficient documentation

## 2022-05-21 DIAGNOSIS — I10 Essential (primary) hypertension: Secondary | ICD-10-CM | POA: Diagnosis not present

## 2022-05-21 DIAGNOSIS — E119 Type 2 diabetes mellitus without complications: Secondary | ICD-10-CM | POA: Insufficient documentation

## 2022-05-21 DIAGNOSIS — I509 Heart failure, unspecified: Secondary | ICD-10-CM | POA: Insufficient documentation

## 2022-05-21 DIAGNOSIS — R600 Localized edema: Secondary | ICD-10-CM | POA: Diagnosis not present

## 2022-05-21 DIAGNOSIS — F444 Conversion disorder with motor symptom or deficit: Secondary | ICD-10-CM | POA: Diagnosis not present

## 2022-05-21 LAB — CBC
HCT: 38.4 % — ABNORMAL LOW (ref 39.0–52.0)
Hemoglobin: 12.3 g/dL — ABNORMAL LOW (ref 13.0–17.0)
MCH: 28.3 pg (ref 26.0–34.0)
MCHC: 32 g/dL (ref 30.0–36.0)
MCV: 88.3 fL (ref 80.0–100.0)
Platelets: 249 10*3/uL (ref 150–400)
RBC: 4.35 MIL/uL (ref 4.22–5.81)
RDW: 14 % (ref 11.5–15.5)
WBC: 7.3 10*3/uL (ref 4.0–10.5)
nRBC: 0 % (ref 0.0–0.2)

## 2022-05-21 LAB — BASIC METABOLIC PANEL
Anion gap: 9 (ref 5–15)
BUN: 13 mg/dL (ref 6–20)
CO2: 28 mmol/L (ref 22–32)
Calcium: 9.1 mg/dL (ref 8.9–10.3)
Chloride: 102 mmol/L (ref 98–111)
Creatinine, Ser: 0.84 mg/dL (ref 0.61–1.24)
GFR, Estimated: >60 mL/min (ref >60)
Glucose, Bld: 151 mg/dL — ABNORMAL HIGH (ref 70–99)
Potassium: 3.9 mmol/L (ref 3.5–5.1)
Sodium: 139 mmol/L (ref 135–145)

## 2022-05-21 NOTE — ED Triage Notes (Signed)
Pt brought in by caregiver for multiple chief complaints. Pt reports generalized peripheral extremity edema and vomiting that began this morning.

## 2022-05-22 ENCOUNTER — Emergency Department: Payer: Medicaid Other

## 2022-05-22 ENCOUNTER — Emergency Department
Admission: EM | Admit: 2022-05-22 | Discharge: 2022-05-22 | Disposition: A | Discharge status: Home / Self Care | Payer: Medicaid Other | Attending: Emergency Medicine | Admitting: Emergency Medicine

## 2022-05-22 ENCOUNTER — Other Ambulatory Visit: Payer: Self-pay

## 2022-05-22 DIAGNOSIS — F444 Conversion disorder with motor symptom or deficit: Secondary | ICD-10-CM

## 2022-05-22 DIAGNOSIS — R600 Localized edema: Secondary | ICD-10-CM

## 2022-05-22 DIAGNOSIS — R531 Weakness: Secondary | ICD-10-CM

## 2022-05-22 DIAGNOSIS — R200 Anesthesia of skin: Secondary | ICD-10-CM

## 2022-05-22 LAB — COMPREHENSIVE METABOLIC PANEL
ALT: 18 U/L (ref 0–44)
AST: 22 U/L (ref 15–41)
Albumin: 4 g/dL (ref 3.5–5.0)
Alkaline Phosphatase: 70 U/L (ref 38–126)
Anion gap: 9 (ref 5–15)
BUN: 12 mg/dL (ref 6–20)
CO2: 31 mmol/L (ref 22–32)
Calcium: 9.2 mg/dL (ref 8.9–10.3)
Chloride: 98 mmol/L (ref 98–111)
Creatinine, Ser: 0.78 mg/dL (ref 0.61–1.24)
GFR, Estimated: >60 mL/min (ref >60)
Glucose, Bld: 99 mg/dL (ref 70–99)
Potassium: 4 mmol/L (ref 3.5–5.1)
Sodium: 138 mmol/L (ref 135–145)
Total Bilirubin: 0.9 mg/dL (ref 0.3–1.2)
Total Protein: 7.5 g/dL (ref 6.5–8.1)

## 2022-05-22 LAB — CBC WITH DIFFERENTIAL/PLATELET
Abs Immature Granulocytes: 0.06 10*3/uL (ref 0.00–0.07)
Abs Immature Granulocytes: 0.03 10*3/uL (ref 0.00–0.07)
Basophils Absolute: 0 10*3/uL (ref 0.0–0.1)
Basophils Absolute: 0 10*3/uL (ref 0.0–0.1)
Basophils Relative: 1 %
Basophils Relative: 0 %
Eosinophils Absolute: 0.1 10*3/uL (ref 0.0–0.5)
Eosinophils Absolute: 0.1 10*3/uL (ref 0.0–0.5)
Eosinophils Relative: 2 %
Eosinophils Relative: 2 %
HCT: 38.3 % — ABNORMAL LOW (ref 39.0–52.0)
HCT: 37 % — ABNORMAL LOW (ref 39.0–52.0)
Hemoglobin: 12.1 g/dL — ABNORMAL LOW (ref 13.0–17.0)
Hemoglobin: 11.6 g/dL — ABNORMAL LOW (ref 13.0–17.0)
Immature Granulocytes: 1 %
Immature Granulocytes: 1 %
Lymphocytes Relative: 21 %
Lymphocytes Relative: 23 %
Lymphs Abs: 1.3 10*3/uL (ref 0.7–4.0)
Lymphs Abs: 1.4 10*3/uL (ref 0.7–4.0)
MCH: 27.7 pg (ref 26.0–34.0)
MCH: 27.6 pg (ref 26.0–34.0)
MCHC: 31.6 g/dL (ref 30.0–36.0)
MCHC: 31.4 g/dL (ref 30.0–36.0)
MCV: 87.6 fL (ref 80.0–100.0)
MCV: 87.9 fL (ref 80.0–100.0)
Monocytes Absolute: 0.7 10*3/uL (ref 0.1–1.0)
Monocytes Absolute: 0.7 10*3/uL (ref 0.1–1.0)
Monocytes Relative: 11 %
Monocytes Relative: 12 %
Neutro Abs: 4.1 10*3/uL (ref 1.7–7.7)
Neutro Abs: 3.9 10*3/uL (ref 1.7–7.7)
Neutrophils Relative %: 64 %
Neutrophils Relative %: 62 %
Platelets: 248 10*3/uL (ref 150–400)
Platelets: 223 10*3/uL (ref 150–400)
RBC: 4.37 MIL/uL (ref 4.22–5.81)
RBC: 4.21 MIL/uL — ABNORMAL LOW (ref 4.22–5.81)
RDW: 14.2 % (ref 11.5–15.5)
RDW: 14.2 % (ref 11.5–15.5)
WBC: 6.4 10*3/uL (ref 4.0–10.5)
WBC: 6.3 10*3/uL (ref 4.0–10.5)
nRBC: 0 % (ref 0.0–0.2)
nRBC: 0 % (ref 0.0–0.2)

## 2022-05-22 LAB — URINE DRUG SCREEN, QUALITATIVE (ARMC ONLY)
Amphetamines, Ur Screen: NOT DETECTED
Barbiturates, Ur Screen: NOT DETECTED
Benzodiazepine, Ur Scrn: NOT DETECTED
Cannabinoid 50 Ng, Ur ~~LOC~~: NOT DETECTED
Cocaine Metabolite,Ur ~~LOC~~: NOT DETECTED
MDMA (Ecstasy)Ur Screen: NOT DETECTED
Methadone Scn, Ur: NOT DETECTED
Opiate, Ur Screen: NOT DETECTED
Phencyclidine (PCP) Ur S: NOT DETECTED
Tricyclic, Ur Screen: POSITIVE — AB

## 2022-05-22 LAB — URINALYSIS, ROUTINE W REFLEX MICROSCOPIC
Bilirubin Urine: NEGATIVE
Glucose, UA: 50 mg/dL — AB
Hgb urine dipstick: NEGATIVE
Ketones, ur: NEGATIVE mg/dL
Leukocytes,Ua: NEGATIVE
Nitrite: NEGATIVE
Protein, ur: NEGATIVE mg/dL
Specific Gravity, Urine: 1.008 (ref 1.005–1.030)
pH: 6 (ref 5.0–8.0)

## 2022-05-22 LAB — ETHANOL: Alcohol, Ethyl (B): <10 mg/dL (ref <10)

## 2022-05-22 LAB — RESP PANEL BY RT-PCR (RSV, FLU A&B, COVID)  RVPGX2
Influenza A by PCR: NEGATIVE
Influenza B by PCR: NEGATIVE
Resp Syncytial Virus by PCR: NEGATIVE
SARS Coronavirus 2 by RT PCR: NEGATIVE

## 2022-05-22 LAB — LITHIUM LEVEL: Lithium Lvl: 0.2 mmol/L — ABNORMAL LOW (ref 0.60–1.20)

## 2022-05-22 LAB — D-DIMER, QUANTITATIVE: D-Dimer, Quant: 0.41 ug/mL-FEU (ref 0.00–0.50)

## 2022-05-22 LAB — APTT: aPTT: 33 seconds (ref 24–36)

## 2022-05-22 LAB — PROTIME-INR
INR: 1 (ref 0.8–1.2)
Prothrombin Time: 13.3 seconds (ref 11.4–15.2)

## 2022-05-22 LAB — BRAIN NATRIURETIC PEPTIDE: B Natriuretic Peptide: 11 pg/mL (ref 0.0–100.0)

## 2022-05-22 LAB — VALPROIC ACID LEVEL: Valproic Acid Lvl: 43 ug/mL — ABNORMAL LOW (ref 50.0–100.0)

## 2022-05-22 LAB — TROPONIN I (HIGH SENSITIVITY)
Troponin I (High Sensitivity): 2 ng/L (ref <18)
Troponin I (High Sensitivity): 2 ng/L (ref <18)

## 2022-05-22 MED ORDER — IOHEXOL 350 MG/ML SOLN
75.0000 mL | Freq: Once | INTRAVENOUS | Status: AC | PRN
  Administered 2022-05-22: 75 mL via INTRAVENOUS

## 2022-05-22 MED ORDER — ONDANSETRON HCL 4 MG/2ML IJ SOLN
4.0000 mg | Freq: Once | INTRAMUSCULAR | Status: AC
  Administered 2022-05-22: 4 mg via INTRAVENOUS
  Filled 2022-05-22: qty 2

## 2022-05-22 MED ORDER — MORPHINE SULFATE (PF) 2 MG/ML IV SOLN
2.0000 mg | Freq: Once | INTRAVENOUS | Status: AC
  Administered 2022-05-22: 2 mg via INTRAVENOUS
  Filled 2022-05-22: qty 1

## 2022-05-22 MED ORDER — FUROSEMIDE 10 MG/ML IJ SOLN
20.0000 mg | Freq: Once | INTRAMUSCULAR | Status: AC
  Administered 2022-05-22: 20 mg via INTRAVENOUS
  Filled 2022-05-22: qty 4

## 2022-05-22 NOTE — ED Notes (Signed)
Patient stated they had coughed up some blood. Some tinged sputum was noted in a cup located on the bedside table

## 2022-05-22 NOTE — Progress Notes (Signed)
   05/22/22 1000  Clinical Encounter Type  Visited With Patient not available;Health care provider  Visit Type Initial;Code  Referral From Nurse  Consult/Referral To Chaplain   Chaplain responded to Code Stroke. Patient at CT scan. No family present.

## 2022-05-22 NOTE — Consult Note (Signed)
CODE STROKE- PHARMACY COMMUNICATION   Time CODE STROKE called/page received: 1022  Time response to CODE STROKE was made in person: 1026  Time Stroke Kit retrieved from [place] (only if needed): N/A  Name of Provider contacted: Su Monks, MD  Past Medical History:  Diagnosis Date   Atrial fibrillation ([place])    Cardiomyopathy ([place])    a. 07/2021 Echo: EF 50%, mild LVH, nl RV size/function. No significant valvular disease.   Chest pain    CHF (congestive heart failure) (HCC)    Conversion disorder    Current use of long term anticoagulation    Diabetes mellitus without complication (HCC)    Hyperlipidemia    Hypertension    OSA (obstructive sleep apnea)    Persistent atrial fibrillation and flutter ([place])    a. CHA2DS2VASc = 3-4   Schizoaffective disorder (HCC)    Seizure disorder (HCC)    TIA (transient ischemic attack)    Urethral stricture    a. 08/2021 s/p cystoscopy and urethral dilation.   Prior to Admission medications   Medication Sig Start Date End Date Taking? Authorizing Provider  acetaminophen (TYLENOL) 650 MG suppository Place 1,300 mg rectally every 4 (four) hours as needed.    [provider]  albuterol (VENTOLIN HFA) 108 (90 Base) MCG/ACT inhaler Inhale 2 puffs into the lungs every 6 (six) hours as needed for wheezing. 03/23/22   Martyn Ehrich, NP  allopurinol (ZYLOPRIM) 300 MG tablet Take 1 tablet (300 mg total) by mouth daily. 08/29/21   Loletha Grayer, MD  atorvastatin (LIPITOR) 80 MG tablet Take 80 mg by mouth daily. 05/11/21   [provider]  baclofen (LIORESAL) 10 MG tablet Take 1 tablet (10 mg total) by mouth 2 (two) times daily as needed for muscle spasms. Home med. 08/16/21   Enzo Bi, MD  benztropine (COGENTIN) 1 MG tablet Take 1 mg by mouth daily.    [provider]  cephALEXin (KEFLEX) 500 MG capsule Take 1 capsule (500 mg total) by mouth 2 (two) times daily. 04/25/22   Lavonia Drafts, MD  colchicine 0.6 MG tablet Take  1 tablet (0.6 mg total) by mouth daily for 3 days. 04/29/22 05/02/22  Nathaniel Man, MD  cyclobenzaprine (FLEXERIL) 5 MG tablet Take 5 mg by mouth 3 (three) times daily as needed for muscle spasms.    [provider]  diclofenac Sodium (VOLTAREN) 1 % GEL Apply 2 g topically 4 (four) times daily. 12/30/21   Carrie Mew, MD  diltiazem (CARDIZEM CD) 360 MG 24 hr capsule Take 1 capsule (360 mg total) by mouth daily. 09/10/21   Lorella Nimrod, MD  divalproex (DEPAKOTE) 500 MG DR tablet Take 2 tablets (1,000 mg total) by mouth 2 (two) times daily. 09/25/21   Wouk, Ailene Rud, MD  ELIQUIS 5 MG TABS tablet Take 5 mg by mouth 2 (two) times daily. 06/16/21   [provider]  escitalopram (LEXAPRO) 10 MG tablet Take 20 mg by mouth daily. 06/16/21   [provider]  FARXIGA 10 MG TABS tablet TAKE 1 TABLET BY MOUTH ONCE DAILY BEFORE BREAKFAST 05/21/22   Darylene Price A, FNP  fluticasone (FLONASE) 50 MCG/ACT nasal spray Place 2 sprays into both nostrils 2 (two) times daily. 06/16/21   [provider]  furosemide (LASIX) 20 MG tablet Take 1 tablet (20 mg total) by mouth daily. 12/30/21   Carrie Mew, MD  HYDROcodone-acetaminophen (NORCO/VICODIN) 5-325 MG tablet Take 1 tablet by mouth every 4 (four) hours as needed. 09/16/21  Ward, Delice Bison, DO  hydrOXYzine (ATARAX) 25 MG tablet Take 25 mg by mouth daily. At noon    [provider]  hydrOXYzine (ATARAX) 50 MG tablet Take 50 mg by mouth in the morning and at bedtime.    [provider]  ibuprofen (ADVIL) 800 MG tablet Take 800 mg by mouth every 8 (eight) hours as needed.    [provider]  INVEGA 9 MG 24 hr tablet Take 9 mg by mouth every morning. 07/21/21   [provider]  lithium carbonate (LITHOBID) 300 MG CR tablet Take 600 mg by mouth at bedtime as needed.    [provider]  loratadine (CLARITIN) 10 MG tablet Take 10 mg by mouth daily. 06/16/21   [provider]   melatonin 3 MG TABS tablet Take 3 mg by mouth at bedtime. 06/08/21   [provider]  metoprolol tartrate (LOPRESSOR) 100 MG tablet Take 1 tablet (100 mg total) by mouth 2 (two) times daily. 01/26/22   End, Harrell Gave, MD  naproxen (NAPROSYN) 500 MG tablet Take 1 tablet (500 mg total) by mouth 2 (two) times daily with a meal. 10/07/21   Lavonia Drafts, MD  omeprazole (PRILOSEC) 20 MG capsule Take 20 mg by mouth daily.    [provider]  potassium chloride SA (KLOR-CON M) 20 MEQ tablet Take 1 tablet (20 mEq total) by mouth daily for 7 days. 10/29/21   Carrie Mew, MD  promethazine (PHENERGAN) 25 MG tablet Take 25 mg by mouth every 6 (six) hours as needed for nausea or vomiting.    [provider]  sacubitril-valsartan (ENTRESTO) 24-26 MG Take 1 tablet by mouth 2 (two) times daily. 10/27/21   Alisa Graff, FNP  tamsulosin (FLOMAX) 0.4 MG CAPS capsule Take 1 capsule (0.4 mg total) by mouth daily. 09/10/21   Lorella Nimrod, MD  traMADol (ULTRAM) 50 MG tablet Take 50 mg by mouth every 6 (six) hours as needed.    [provider]    Gretel Acre, PharmD PGY1 Pharmacy Resident 05/22/2022 10:43 AM

## 2022-05-22 NOTE — Discharge Instructions (Addendum)
Your MRI is negative.  There is no sign of a stroke.  Neurology thinks it is okay for you to go home and I agree.  There is no sign of COVID or flu or RSV.  Your blood work is okay.  Go home rest in bed for the rest of today.  Keep your legs elevated in bed.  Take it easy tomorrow.  Keep your legs up.  Use Tylenol if you have aches and pains.  Return if you get worse or if the swelling does not improve after resting in bed tomorrow.  Make sure you are taking all the rest of your medications as well as the fluid pill.  Both your valproic acid level and your lithium level are somewhat low.

## 2022-05-22 NOTE — Progress Notes (Signed)
Code stroke cart activated per elert @ 1022.  Bedside paramedic reports that the pt was LKW at approximately 0945, and developed left side weakness and numbness to the entire left side. ROS reported @1015  when pt called out for nurse. Pt reports that he cannot lift left arm or leg but does not attempt on assessment.  Dr paged @ 1025, pt to CT @ 1025.  Dr Selina Cooley to bedside in CT to assess patient.  No TNK due to eliquis, patient returned from CT @ 1043.  TSRN off camera @ 1052.    Pt has CG and is oxygen dependent with multiple comorbidities per record review, MRS 3

## 2022-05-22 NOTE — ED Notes (Signed)
Pt placed on 4L [place]- Pt states he wears this amount intermittently.

## 2022-05-22 NOTE — ED Provider Notes (Addendum)
[facility] Provider Note    Event Date/Time   First MD Initiated Contact with Patient 05/22/22 [phone]     (approximate)   History   Weakness   HPI  Daniel [name] is a 31 y.o. male with a long medical history recheck but and past medical history.  He complains of weakness and tiredness for the last day or 2.  In triage she said that he had vomiting this morning.  He is not vomiting currently and has not vomited for some time.  He asked for water.  He also complains of swelling in his legs has been going on for several days.  He says this never been quite this bad before.  He says he is coughing a little bit and just does not feel good.   Past medical history: Atrial fibrillation (HCC) Date Unknown Cardiomyopathy [facility])  Date Unknown Chest pain Date Unknown CHF (congestive heart failure) (HCC) Date Unknown Conversion disorder Date Unknown Current use of long term anticoagulation Date Unknown Diabetes mellitus without complication (HCC) Date Unknown Hyperlipidemia Date Unknown Hypertension Date Unknown OSA (obstructive sleep apnea) Date Unknown Persistent atrial fibrillation and flutter (HCC)  Date Unknown Schizoaffective disorder (HCC) Date Unknown Seizure disorder [facility]) Date Unknown TIA (transient ischemic attack) Date Unknown Urethral stricture   Physical Exam   Triage Vital Signs: ED Triage Vitals  Enc Vitals Group     BP 05/21/22 2241 133/77     Pulse Rate 05/21/22 2241 99     Resp 05/21/22 2241 18     Temp 05/21/22 2241 98.5 F (36.9 C)     Temp Source 05/22/22 0424 Oral     SpO2 05/21/22 2241 92 %     Weight 05/21/22 2248 (!) 381 lb (172.8 kg)     Height 05/21/22 2248 6\' 5"  (1.956 m)     Head Circumference --      Peak Flow --      Pain Score 05/21/22 2248 10     Pain Loc --      Pain Edu? --      Excl. in GC? --     Most recent vital signs: Vitals:   05/22/22 1017 05/22/22 1102  BP: (!) 161/104 (!) 142/97   Pulse: (!) 107 94  Resp: 18 16  Temp: 98 F (36.7 C) 98.1 F (36.7 C)  SpO2:  94%    General: Awake, no distress.  CV:  Good peripheral perfusion.  Heart currently regular rate and rhythm by auscultation.  No audible murmurs Resp:  Normal effort.  Lungs sound clear Abd:  No distention.  Soft and nontender Extremities: 2+ edema bilaterally to the knees.  There is a very faint petechial rash present as well.   ED Results / Procedures / Treatments   Labs (all labs ordered are listed, but only abnormal results are displayed) Labs Reviewed  BASIC METABOLIC PANEL - Abnormal; Notable for the following components:      Result Value   Glucose, Bld 151 (*)    All other components within normal limits  CBC - Abnormal; Notable for the following components:   Hemoglobin 12.3 (*)    HCT 38.4 (*)    All other components within normal limits  LITHIUM LEVEL - Abnormal; Notable for the following components:   Lithium Lvl 0.20 (*)    All other components within normal limits  VALPROIC ACID LEVEL - Abnormal; Notable for the following components:   Valproic Acid Lvl 43 (*)  All other components within normal limits  CBC WITH DIFFERENTIAL/PLATELET - Abnormal; Notable for the following components:   Hemoglobin 12.1 (*)    HCT 38.3 (*)    All other components within normal limits  URINE DRUG SCREEN, QUALITATIVE ([facility] ONLY) - Abnormal; Notable for the following components:   Tricyclic, Ur Screen POSITIVE (*)    All other components within normal limits  URINALYSIS, ROUTINE W REFLEX MICROSCOPIC - Abnormal; Notable for the following components:   Color, Urine STRAW (*)    APPearance CLEAR (*)    Glucose, UA 50 (*)    All other components within normal limits  CBC WITH DIFFERENTIAL/PLATELET - Abnormal; Notable for the following components:   RBC 4.21 (*)    Hemoglobin 11.6 (*)    HCT 37.0 (*)    All other components within normal limits  RESP PANEL BY RT-PCR (RSV, FLU A&B, COVID)  RVPGX2   BRAIN NATRIURETIC PEPTIDE  D-DIMER, QUANTITATIVE  COMPREHENSIVE METABOLIC PANEL  PROTIME-INR  APTT  ETHANOL  TROPONIN I (HIGH SENSITIVITY)  TROPONIN I (HIGH SENSITIVITY)     EKG  EKG read interpreted by me shows normal sinus rhythm rate of 99 normal axis looks very similar to EKG from 27 November of this year.  QTc is 490 ms   RADIOLOGY Chest x-ray read by radiology reviewed and interpreted by me as negative for any changes  PROCEDURES:  Critical Care performed:   Procedures   MEDICATIONS ORDERED IN ED: Medications  furosemide (LASIX) injection 20 mg (20 mg Intravenous Given 05/22/22 0902)  morphine (PF) 2 MG/ML injection 2 mg (2 mg Intravenous Given 05/22/22 0905)  ondansetron (ZOFRAN) injection 4 mg (4 mg Intravenous Given 05/22/22 0904)  iohexol (OMNIPAQUE) 350 MG/ML injection 75 mL (75 mLs Intravenous Contrast Given 05/22/22 1036)     IMPRESSION / MDM / ASSESSMENT AND PLAN / ED COURSE  I reviewed the triage vital signs and the nursing notes.  ----------------------------------------- 10:19 AM on 05/22/2022 ----------------------------------------- About 2 minutes ago patient called the nurse and let her know that he was feeling numb.  This just started.  He has numbness on the left side of his body.  He seems to be weak.  Rapid neuro exam shows numbness on the face arm leg and tummy.  There seems to be a little weakness on the left side in the arm and leg as well.  Smile goes up on the right but not the left.  Forehead moves well bilaterally.  I have ordered a head CT and stroke orders and call the stroke.  Patient has a history of TIA in the past.  He also has multiple other risk factors.  He also has a history of conversion disorder. ----------------------------------------- 10:56 AM on 05/22/2022 ----------------------------------------- To spoke with neurology.  They report this is the fourth time they seen him for these type of symptoms.  Will get an MRI if it  is positive we will bring him in the hospital if it is negative we will have him follow-up outpatient for this problem.  We are waiting on some of the rest of the test to come back blood work wise.  ----------------------------------------- 2:08 PM on 05/22/2022 ----------------------------------------- Patient's MRI has come back is negative.  Neurology did not feel he needed an MRA.  Rest of his blood work is either stable or negative with exception of the lithium and valproic acid levels being low.  I have asked him to make sure he is taking his medicines as directed.  I have discharged him. Differential diagnosis includes, but is not limited to, TIA or CVA or as is more likely conversion disorder patient has had this 4 times already this year apparently.  Patient seems to do this when he is not being seen.  I should add that the patient had 1 episode of coughing up a small amount of clearish phlegm with some bloody streaks in it.  This is not worrisome currently.  Patient has not had any chest pain or shortness of breath except for his chronic shortness of breath with his CHF symptoms.  I should add that his BNP and troponin are negative.  His platelet count is normal I did give him some Lasix IV as he requested for his leg swelling.  This seems to have helped at least somewhat.  Patient's presentation is most consistent with acute presentation with potential threat to life or bodily function.  The patient is on the cardiac monitor to evaluate for evidence of arrhythmia and/or significant heart rate changes.  None have been seen  Clinical Course as of 05/22/22 1412  Sun May 22, 2022  1307 MR ANGIO HEAD WO CONTRAST [PM]    Clinical Course User Index [PM] Arnaldo Natal, MD     FINAL CLINICAL IMPRESSION(S) / ED DIAGNOSES   Final diagnoses:  Weakness  Bilateral leg edema  Numbness and tingling of left arm and leg     Rx / DC Orders   ED Discharge Orders          Ordered     Nursing communication       Comments: Patient can have his discharge instructions printed until the legal guardian is marked as being notified.   05/22/22 1412             Note:  This document was prepared using Dragon voice recognition software and may include unintentional dictation errors.   Arnaldo Natal, MD 05/22/22 1413    Arnaldo Natal, MD 05/22/22 1414 ----------------------------------------- 3:05 PM on 05/22/2022 ----------------------------------------- Patient now complains of some pain in his shoulder especially with movement will x-ray back.  Ultrasound of the legs is pending results.  Patient also asked for TED stockings to help with the swelling which I ordered.  Patient asked for a knee brace because he says his right knee is giving him problems.  I have ordered a knee sleeve for him.  When I examined his knee does not have any effusion I cannot find any instability or popping or clicking when I manipulate his knee to check the cartilages and the tendons. ----------------------------------------- 3:27 PM on 05/22/2022 ----------------------------------------- Shoulder x-ray looks normal to me when I review it and interpreted.  We will wait for the radiologist report .  Ultrasound of the bilateral legs does not show any DVTs when I review it.  I am waiting for the radiologist report on this as well.  I will sign the patient out to oncoming provider.   Arnaldo Natal, MD 05/22/22 [phone]

## 2022-05-22 NOTE — ED Notes (Signed)
[facility] caregiver called, will be here to pick up pt in ~15 minutes

## 2022-05-22 NOTE — Consult Note (Signed)
NEUROLOGY CONSULTATION NOTE   Date of service: May 22, 2022 Patient Name: Daniel [name] MRN:  [id number] DOB:  1991-01-23 Reason for consult: stroke code Requesting physician: Dr. Dorothea Glassman _ _ _   _ __   _ __ _ _  __ __   _ __   __ _  History of Present Illness   31 yo man with hx a fib on eliquis last dose yesterday, cardiomyopathy, CHF, DM2, HTN, HL, OSA, seizure disorder on depakote, and schizoaffective disorder. He has a well-documented history of conversion disorder and psychogenic nonepileptic spells. This is the 4th time this year we have been consulted for acute onset L sided weakness, all followed by negative workup.  Patient presented 10 hours ago to ED with generalized weakness and nonfocal exam. At 0945 LKW he had sudden onset of L sided flaccid paralysis. NIHSS = 12. Head CT no acute process. TNK not administered 2/2 contraindication of patient being on eliquis and because sx were favored to be 2/2 conversion disorder and not stroke. CTA showed no LVO. CNS imaging personally reviewed.    ROS   Per HPI: all other systems reviewed and are negative  Past History   I have reviewed the following:  Past Medical History:  Diagnosis Date   Atrial fibrillation (HCC)    Cardiomyopathy (HCC)    a. 07/2021 Echo: EF 50%, mild LVH, nl RV size/function. No significant valvular disease.   Chest pain    CHF (congestive heart failure) (HCC)    Conversion disorder    Current use of long term anticoagulation    Diabetes mellitus without complication (HCC)    Hyperlipidemia    Hypertension    OSA (obstructive sleep apnea)    Persistent atrial fibrillation and flutter (HCC)    a. CHA2DS2VASc = 3-4   Schizoaffective disorder (HCC)    Seizure disorder (HCC)    TIA (transient ischemic attack)    Urethral stricture    a. 08/2021 s/p cystoscopy and urethral dilation.   Past Surgical History:  Procedure Laterality Date   CYSTOSCOPY WITH URETHRAL DILATATION N/A 09/07/2021    Procedure: CYSTOSCOPY WITH URETHRAL DILATATION CATHETER PLACEMENT;  Surgeon: Riki Altes, MD;  Location: [facility] ORS;  Service: Urology;  Laterality: N/A;   History reviewed. No pertinent family history. Social History   Socioeconomic History   Marital status: Single    Spouse name: Not on file   Number of children: Not on file   Years of education: Not on file   Highest education level: Not on file  Occupational History   Not on file  Tobacco Use   Smoking status: Former    Years: 2.00    Types: Cigarettes    Quit date: 08/2021    Years since quitting: 0.7   Smokeless tobacco: Never  Vaping Use   Vaping Use: Never used  Substance and Sexual Activity   Alcohol use: Not Currently   Drug use: Never   Sexual activity: Not on file  Other Topics Concern   Not on file  Social History Narrative   Now living in a group home in [place].   Social Determinants of Health   Financial Resource Strain: Not on file  Food Insecurity: Not on file  Transportation Needs: Not on file  Physical Activity: Not on file  Stress: Not on file  Social Connections: Not on file   Allergies  Allergen Reactions   Haldol [Haloperidol] Other (See Comments)    SI   Dermatitis Antigen  Rash   Meperidine     Meperidine   Abilify [Aripiprazole] Palpitations   Demerol [Meperidine Hcl] Hives   Tape Rash    Other reaction(s): Other (See Comments) Itching, skin tear    Medications   (Not in a hospital admission)    No current facility-administered medications for this encounter.  Current Outpatient Medications:    acetaminophen (TYLENOL) 650 MG suppository, Place 1,300 mg rectally every 4 (four) hours as needed., Disp: , Rfl:    albuterol (VENTOLIN HFA) 108 (90 Base) MCG/ACT inhaler, Inhale 2 puffs into the lungs every 6 (six) hours as needed for wheezing., Disp: 1 each, Rfl: 2   allopurinol (ZYLOPRIM) 300 MG tablet, Take 1 tablet (300 mg total) by mouth daily., Disp: 30 tablet, Rfl: 0    atorvastatin (LIPITOR) 80 MG tablet, Take 80 mg by mouth daily., Disp: , Rfl:    baclofen (LIORESAL) 10 MG tablet, Take 1 tablet (10 mg total) by mouth 2 (two) times daily as needed for muscle spasms. Home med., Disp: 30 each, Rfl: 0   benztropine (COGENTIN) 1 MG tablet, Take 1 mg by mouth daily., Disp: , Rfl:    cephALEXin (KEFLEX) 500 MG capsule, Take 1 capsule (500 mg total) by mouth 2 (two) times daily., Disp: 14 capsule, Rfl: 0   colchicine 0.6 MG tablet, Take 1 tablet (0.6 mg total) by mouth daily for 3 days., Disp: 3 tablet, Rfl: 0   cyclobenzaprine (FLEXERIL) 5 MG tablet, Take 5 mg by mouth 3 (three) times daily as needed for muscle spasms., Disp: , Rfl:    diclofenac Sodium (VOLTAREN) 1 % GEL, Apply 2 g topically 4 (four) times daily., Disp: 100 g, Rfl: 0   diltiazem (CARDIZEM CD) 360 MG 24 hr capsule, Take 1 capsule (360 mg total) by mouth daily., Disp: 30 capsule, Rfl: 1   divalproex (DEPAKOTE) 500 MG DR tablet, Take 2 tablets (1,000 mg total) by mouth 2 (two) times daily., Disp: , Rfl:    ELIQUIS 5 MG TABS tablet, Take 5 mg by mouth 2 (two) times daily., Disp: , Rfl:    escitalopram (LEXAPRO) 10 MG tablet, Take 20 mg by mouth daily., Disp: , Rfl:    FARXIGA 10 MG TABS tablet, TAKE 1 TABLET BY MOUTH ONCE DAILY BEFORE BREAKFAST, Disp: 28 tablet, Rfl: 10   fluticasone (FLONASE) 50 MCG/ACT nasal spray, Place 2 sprays into both nostrils 2 (two) times daily., Disp: , Rfl:    furosemide (LASIX) 20 MG tablet, Take 1 tablet (20 mg total) by mouth daily., Disp: 30 tablet, Rfl: 0   HYDROcodone-acetaminophen (NORCO/VICODIN) 5-325 MG tablet, Take 1 tablet by mouth every 4 (four) hours as needed., Disp: 15 tablet, Rfl: 0   hydrOXYzine (ATARAX) 25 MG tablet, Take 25 mg by mouth daily. At noon, Disp: , Rfl:    hydrOXYzine (ATARAX) 50 MG tablet, Take 50 mg by mouth in the morning and at bedtime., Disp: , Rfl:    ibuprofen (ADVIL) 800 MG tablet, Take 800 mg by mouth every 8 (eight) hours as needed., Disp:  , Rfl:    INVEGA 9 MG 24 hr tablet, Take 9 mg by mouth every morning., Disp: , Rfl:    lithium carbonate (LITHOBID) 300 MG CR tablet, Take 600 mg by mouth at bedtime as needed., Disp: , Rfl:    loratadine (CLARITIN) 10 MG tablet, Take 10 mg by mouth daily., Disp: , Rfl:    melatonin 3 MG TABS tablet, Take 3 mg by mouth at bedtime., Disp: ,  Rfl:    metoprolol tartrate (LOPRESSOR) 100 MG tablet, Take 1 tablet (100 mg total) by mouth 2 (two) times daily., Disp: 180 tablet, Rfl: 1   naproxen (NAPROSYN) 500 MG tablet, Take 1 tablet (500 mg total) by mouth 2 (two) times daily with a meal., Disp: 20 tablet, Rfl: 2   omeprazole (PRILOSEC) 20 MG capsule, Take 20 mg by mouth daily., Disp: , Rfl:    potassium chloride SA (KLOR-CON M) 20 MEQ tablet, Take 1 tablet (20 mEq total) by mouth daily for 7 days., Disp: 7 tablet, Rfl: 0   promethazine (PHENERGAN) 25 MG tablet, Take 25 mg by mouth every 6 (six) hours as needed for nausea or vomiting., Disp: , Rfl:    sacubitril-valsartan (ENTRESTO) 24-26 MG, Take 1 tablet by mouth 2 (two) times daily., Disp: 60 tablet, Rfl: 3   tamsulosin (FLOMAX) 0.4 MG CAPS capsule, Take 1 capsule (0.4 mg total) by mouth daily., Disp: 30 capsule, Rfl: 2   traMADol (ULTRAM) 50 MG tablet, Take 50 mg by mouth every 6 (six) hours as needed., Disp: , Rfl:   Vitals   Vitals:   05/21/22 2252 05/22/22 0424 05/22/22 0913 05/22/22 1017  BP:  (!) 137/92 132/76 (!) 161/104  Pulse:  92 88 (!) 107  Resp:  18 18 18   Temp:  98.2 F (36.8 C) 98.1 F (36.7 C) 98 F (36.7 C)  TempSrc:  Oral Oral Oral  SpO2: 94% 95% 95%   Weight:      Height:         Body mass index is 45.18 kg/m.  Physical Exam   Physical Exam Gen: A&O x4, anxious HEENT: Atraumatic, normocephalic;mucous membranes moist; oropharynx clear, tongue without atrophy or fasciculations. Neck: Supple, trachea midline. Resp: CTAB, no w/r/r CV: RRR, no m/g/r; nml S1 and S2. 2+ symmetric peripheral pulses. Abd: soft/NT/ND;  nabs x 4 quad Extrem: Nml bulk; no cyanosis, clubbing, or edema.  Neuro: *MS: A&O x4, anxious. Able to follow single commands *Speech: moderate dysarthria, able to name and repeat *CN:    I: Deferred   II,III: PERRLA, blinks to threat bilat, optic discs unable to be visualized 2/2 pupillary constriction   III,IV,VI: EOMI w/o nystagmus, no ptosis   V: Sensation intact from V1 to V3 to LT   VII: Eyelid closure was full.  L lower facial droop   VIII: Hearing intact to voice   IX,X: Voice normal, palate elevates symmetrically    XI: SCM/trap 5/5 bilat   XII: Tongue protrudes midline, no atrophy or fasciculations RUE and RLE full strength without drift. LUE and LLE no movement.  *Motor:   Normal bulk.  No tremor, rigidity or bradykinesia. No pronator drift. *Sensory: Impaired to LT L side only. Propioception intact bilat.  No double-simultaneous extinction.  *Coordination:  FNF intact on R *Reflexes:  1+ and symmetric throughout without clonus; toes down-going bilat *Gait: deferred  NIHSS  1a Level of Conscious.: 0 1b LOC Questions: 0 1c LOC Commands: 0 2 Best Gaze: 0 3 Visual: 0 4 Facial Palsy: 2 5a Motor Arm - left: 4 5b Motor Arm - Right: 0 6a Motor Leg - Left: 4 6b Motor Leg - Right: 0 7 Limb Ataxia: 0 8 Sensory: 1 9 Best Language: 0 10 Dysarthria: 1 11 Extinct. and Inatten.: 0  TOTAL: 12  Premorbid mRS = 2   Labs   CBC:  Recent Labs  Lab 05/21/22 2251 05/22/22 0907  WBC 7.3 6.4  NEUTROABS  --  4.1  HGB 12.3* 12.1*  HCT 38.4* 38.3*  MCV 88.3 87.6  PLT 249 248    Basic Metabolic Panel:  Lab Results  Component Value Date   NA 139 05/21/2022   K 3.9 05/21/2022   CO2 28 05/21/2022   GLUCOSE 151 (H) 05/21/2022   BUN 13 05/21/2022   CREATININE 0.84 05/21/2022   CALCIUM 9.1 05/21/2022   GFRNONAA >60 05/21/2022   GFRAA >60 04/29/2013   Lipid Panel:  Lab Results  Component Value Date   LDLCALC 50 08/29/2021   HgbA1c:  Lab Results  Component Value  Date   HGBA1C 6.2 (H) 09/04/2021   Urine Drug Screen:     Component Value Date/Time   LABOPIA NONE DETECTED 11/19/2021 1951   COCAINSCRNUR NONE DETECTED 11/19/2021 1951   LABBENZ NONE DETECTED 11/19/2021 1951   AMPHETMU NONE DETECTED 11/19/2021 1951   THCU NONE DETECTED 11/19/2021 1951   LABBARB NONE DETECTED 11/19/2021 1951    Alcohol Level     Component Value Date/Time   ETH <10 11/19/2021 2006     Impression   31 yo man with hx a fib on eliquis last dose yesterday, cardiomyopathy, CHF, DM2, HTN, HL, OSA, seizure disorder on depakote, and schizoaffective disorder. He has a well-documented history of conversion disorder and psychogenic nonepileptic spells. This is the 4th time this year we have been consulted for acute onset L sided weakness, all followed by negative workup. Stroke code was called for acute onset L sided weakness. CT head and CTA WNL. MRI brain wo contrast showed no acute infarct. This is c/w prior presentations of functional neurologic symptoms disorder and requires no further neurologic workup.   Additionally as noted by Drs. Wilford Corner and [place] on previous evaluation, Depakote can interact with Eliquis and reduce the efficacy of Eliquis.  This does put him at additional risk of stroke, but given his psychiatric issues and frequent presentations for those as well, an alternative anticoagulation such as Lovenox could be considered, at the discretion of his outpatient cardiologist   Recommendations   - No further inpatient neurologic workup indicated - Neurology to sign off, please re-engage if additional neurologic concerns arise ______________________________________________________________________   Thank you for the opportunity to take part in the care of this patient. If you have any further questions, please contact the neurology consultation attending.  Signed,  Bing Neighbors, MD [facility] [phone]  If 7pm- 7am, please page neurology on  call as listed in AMION.

## 2022-05-22 NOTE — ED Notes (Signed)
CODE  STROKE  CALLED  TO  CARELINK  

## 2022-05-24 NOTE — Progress Notes (Unsigned)
Patient ID: Daniel [name], male    DOB: Jun 02, 1990, 31 y.o.   MRN: [id number]  HPI  Daniel [name] is a 31 y/o male with a history of DM, hyperlipidemia, HTN, atrial fibrillation, conversion disorder, sleep apnea, schizoaffective disorder, seizure, TIA, tobacco use and chronic heart failure.   Echo report from 10/22/21 reviewed and showed an EF of 45-50%. Echo report from 08/04/21 reviewed and showed an EF of 50% along with mild LVH  2 ED visits December & 1 ED visit in November/ October. 2 ED visits month of September, 4 ED visits in August and 6 ED visits the month of July. Has been in the ED 8 times the month of June for various complaints.   He presents today for a follow-up visit with a chief complaint of  minimal fatigue upon moderate exertion. Describes this as chronic in nature. He has associated shortness of breath, pedal edema (very little), chest congestion and chronic joint pain along with this. Denies any difficulty sleeping, dizziness, abdominal distention, palpitations, chest pain, wheezing, cough or weight gain. Says that he had covid earlier this morning and still feels some chest congestion from this.   Insurance would not approve entresto even after multiple PA attempts.   Wearing oxygen at 4L at bedtime and occasionally during the day. Understands the importance of getting his sleep study done (last one attempted but patient was uncooperative). Overall he says that he's feeling much better.   Past Medical History:  Diagnosis Date   Atrial fibrillation ([place])    Cardiomyopathy ([place])    a. 07/2021 Echo: EF 50%, mild LVH, nl RV size/function. No significant valvular disease.   Chest pain    CHF (congestive heart failure) (HCC)    Conversion disorder    Current use of long term anticoagulation    Diabetes mellitus without complication (HCC)    Hyperlipidemia    Hypertension    OSA (obstructive sleep apnea)    Persistent atrial fibrillation and flutter ([place])    a. CHA2DS2VASc  = 3-4   Schizoaffective disorder (HCC)    Seizure disorder (HCC)    TIA (transient ischemic attack)    Urethral stricture    a. 08/2021 s/p cystoscopy and urethral dilation.   Past Surgical History:  Procedure Laterality Date   CYSTOSCOPY WITH URETHRAL DILATATION N/A 09/07/2021   Procedure: CYSTOSCOPY WITH URETHRAL DILATATION CATHETER PLACEMENT;  Surgeon: Abbie Sons, MD;  Location: [facility] ORS;  Service: Urology;  Laterality: N/A;   No family history on file. Social History   Tobacco Use   Smoking status: Former    Years: 2.00    Types: Cigarettes    Quit date: 08/2021    Years since quitting: 0.7   Smokeless tobacco: Never  Substance Use Topics   Alcohol use: Not Currently   Allergies  Allergen Reactions   Haldol [Haloperidol] Other (See Comments)    SI   Dermatitis Antigen Rash   Meperidine     Meperidine   Abilify [Aripiprazole] Palpitations   Demerol [Meperidine Hcl] Hives   Tape Rash    Other reaction(s): Other (See Comments) Itching, skin tear   Prior to Admission medications   Medication Sig Start Date End Date Taking? Authorizing Provider  acetaminophen (TYLENOL) 650 MG suppository Place 1,300 mg rectally every 4 (four) hours as needed.   Yes [provider]  albuterol (VENTOLIN HFA) 108 (90 Base) MCG/ACT inhaler Inhale 2 puffs into the lungs every 6 (six) hours as needed for wheezing. 03/23/22  Yes Martyn Ehrich, NP  allopurinol (ZYLOPRIM) 300 MG tablet Take 1 tablet (300 mg total) by mouth daily. 08/29/21  Yes Wieting, Richard, MD  atorvastatin (LIPITOR) 80 MG tablet Take 80 mg by mouth daily. 05/11/21  Yes [provider]  baclofen (LIORESAL) 10 MG tablet Take 1 tablet (10 mg total) by mouth 2 (two) times daily as needed for muscle spasms. Home med. 08/16/21  Yes Enzo Bi, MD  benztropine (COGENTIN) 1 MG tablet Take 1 mg by mouth daily.   Yes [provider]  cyclobenzaprine (FLEXERIL) 5 MG tablet Take 5 mg by mouth 3 (three)  times daily as needed for muscle spasms.   Yes [provider]  diclofenac Sodium (VOLTAREN) 1 % GEL Apply 2 g topically 4 (four) times daily. 12/30/21  Yes Carrie Mew, MD  diltiazem (CARDIZEM CD) 360 MG 24 hr capsule Take 1 capsule (360 mg total) by mouth daily. 09/10/21  Yes Lorella Nimrod, MD  divalproex (DEPAKOTE) 500 MG DR tablet Take 2 tablets (1,000 mg total) by mouth 2 (two) times daily. 09/25/21  Yes Wouk, Ailene Rud, MD  ELIQUIS 5 MG TABS tablet Take 5 mg by mouth 2 (two) times daily. 06/16/21  Yes [provider]  escitalopram (LEXAPRO) 10 MG tablet Take 20 mg by mouth daily. 06/16/21  Yes [provider]  FARXIGA 10 MG TABS tablet TAKE 1 TABLET BY MOUTH ONCE DAILY BEFORE BREAKFAST 05/21/22  Yes Ebonie Westerlund A, FNP  fluticasone (FLONASE) 50 MCG/ACT nasal spray Place 2 sprays into both nostrils 2 (two) times daily. 06/16/21  Yes [provider]  furosemide (LASIX) 20 MG tablet Take 1 tablet (20 mg total) by mouth daily. 12/30/21  Yes Carrie Mew, MD  hydrOXYzine (ATARAX) 25 MG tablet Take 25 mg by mouth daily. At noon   Yes [provider]  hydrOXYzine (ATARAX) 50 MG tablet Take 50 mg by mouth in the morning and at bedtime.   Yes [provider]  ibuprofen (ADVIL) 800 MG tablet Take 800 mg by mouth every 8 (eight) hours as needed.   Yes [provider]  INVEGA 9 MG 24 hr tablet Take 9 mg by mouth every morning. 07/21/21  Yes [provider]  lithium carbonate (LITHOBID) 300 MG CR tablet Take 600 mg by mouth at bedtime as needed.   Yes [provider]  loratadine (CLARITIN) 10 MG tablet Take 10 mg by mouth daily. 06/16/21  Yes [provider]  melatonin 3 MG TABS tablet Take 3 mg by mouth at bedtime. 06/08/21  Yes [provider]  metoprolol tartrate (LOPRESSOR) 100 MG tablet Take 1 tablet (100 mg total) by mouth 2 (two) times daily. 01/26/22  Yes End, Harrell Gave, MD  naproxen (NAPROSYN)  500 MG tablet Take 1 tablet (500 mg total) by mouth 2 (two) times daily with a meal. 10/07/21  Yes Lavonia Drafts, MD  omeprazole (PRILOSEC) 20 MG capsule Take 20 mg by mouth daily.   Yes [provider]  tamsulosin (FLOMAX) 0.4 MG CAPS capsule Take 1 capsule (0.4 mg total) by mouth daily. 09/10/21  Yes Lorella Nimrod, MD  traMADol (ULTRAM) 50 MG tablet Take 50 mg by mouth every 6 (six) hours as needed.   Yes [provider]  colchicine 0.6 MG tablet Take 1 tablet (0.6 mg total) by mouth daily for 3 days. 04/29/22 05/02/22  Nathaniel Man, MD    Review of Systems  Constitutional:  Positive for fatigue. Negative for appetite change.  HENT:  Positive for congestion and rhinorrhea. Negative for sore throat.   Eyes: Negative.   Respiratory:  Positive for apnea and shortness of breath. Negative for cough, chest tightness and wheezing.        + snoring  Cardiovascular:  Positive for leg swelling. Negative for chest pain and palpitations.  Gastrointestinal:  Negative for abdominal distention and abdominal pain.  Endocrine: Negative.   Musculoskeletal:  Positive for arthralgias (left knee) and back pain. Negative for neck pain.  Skin: Negative.   Allergic/Immunologic: Negative.   Neurological:  Negative for dizziness, tremors and light-headedness.  Hematological:  Negative for adenopathy. Does not bruise/bleed easily.  Psychiatric/Behavioral:  Negative for dysphoric mood and sleep disturbance (sleeping on 3 pillows). The patient is not nervous/anxious.    Vitals:   05/25/22 1132  BP: (!) 140/81  Pulse: 78  SpO2: 97%  Weight: (!) 383 lb (173.7 kg)   Wt Readings from Last 3 Encounters:  05/25/22 (!) 383 lb (173.7 kg)  05/21/22 (!) 381 lb (172.8 kg)  04/29/22 (!) 385 lb 12.9 oz (175 kg)   Lab Results  Component Value Date   CREATININE 0.78 05/22/2022   CREATININE 0.84 05/21/2022   CREATININE 0.84 04/29/2022   Physical Exam Vitals and nursing note reviewed. Exam conducted  with a chaperone present (caregiver).  Constitutional:      Appearance: Normal appearance.  HENT:     Head: Normocephalic and atraumatic.  Cardiovascular:     Rate and Rhythm: Normal rate and regular rhythm.  Pulmonary:     Effort: Pulmonary effort is normal. No respiratory distress.     Breath sounds: No wheezing, rhonchi or rales.  Abdominal:     General: There is no distension.     Palpations: Abdomen is soft.  Musculoskeletal:        General: No tenderness.     Cervical back: Normal range of motion.     Right lower leg: Edema (trace pitting) present.     Left lower leg: Edema (trace pitting) present.  Skin:    General: Skin is warm and dry.  Neurological:     Mental Status: He is alert and oriented to person, place, and time. Mental status is at baseline.  Psychiatric:        Attention and Perception: Attention normal.        Mood and Affect: Mood is not anxious or depressed. Affect is not flat.        Behavior: Behavior is not agitated.     Comments: Smiling, holds eye contact   Assessment & Plan:  1: Chronic heart failure with mildly reduced ejection fraction- - NYHA class II - euvolemic - weighing daily; reminded to call for an overnight weight gain of > 2 pounds or a weekly weight gain of > 5 pounds - weight stable from last visit here 3 months ago - he says that he's trying to eat better and rewards himself with ice cream but only on Fridays - not adding salt and says that he doesn't add salt to his food  - emphasized keeping daily fluid intake to 60 ounces daily & rationale for this explained - on GDMT of metoprolol (although tartrate) and farxiga - insurance would not approve entresto even after multiple PA attempts; will start valsartan 80mg  daily today - check BMP next visit - referral placed to [facility] provider - handicap parking application signed today - can take mucinex 600mg  BID PRN - BNP 02/07/22 was 56.8  2: HTN- - BP mildly  elevated (140/81); starting  valsartan per above - sees PCP at the group home  - BMP 05/22/22 reviewed and showed sodium 138, potassium 4.0, creatinine 0.78 and GFR >60  3: Persistent atrial fibrillation- - saw cardiology (End) 02/09/22 - HR regular today  4: Schizophrenia- - on invega & lithium - mental status continues to be under much better control  5: Sleep apnea- - saw pulmonology Volanda Napoleon) 03/23/22 - was supposed to have gotten his sleep study but wasn't being cooperative so he had to leave - wearing oxygen at 4L at bedtime and occasionally during the day    Medication list reviewed.   Return in 3-4 weeks, sooner if needed.

## 2022-05-25 ENCOUNTER — Encounter: Payer: Self-pay | Admitting: Family

## 2022-05-25 ENCOUNTER — Ambulatory Visit: Payer: Medicaid Other | Attending: Family | Admitting: Family

## 2022-05-25 VITALS — BP 140/81 | HR 78 | Wt 383.0 lb

## 2022-05-25 DIAGNOSIS — I509 Heart failure, unspecified: Secondary | ICD-10-CM | POA: Diagnosis not present

## 2022-05-25 DIAGNOSIS — I4819 Other persistent atrial fibrillation: Secondary | ICD-10-CM

## 2022-05-25 DIAGNOSIS — F250 Schizoaffective disorder, bipolar type: Secondary | ICD-10-CM

## 2022-05-25 DIAGNOSIS — I110 Hypertensive heart disease with heart failure: Secondary | ICD-10-CM | POA: Diagnosis not present

## 2022-05-25 DIAGNOSIS — G4730 Sleep apnea, unspecified: Secondary | ICD-10-CM

## 2022-05-25 DIAGNOSIS — E785 Hyperlipidemia, unspecified: Secondary | ICD-10-CM | POA: Diagnosis not present

## 2022-05-25 DIAGNOSIS — I10 Essential (primary) hypertension: Secondary | ICD-10-CM

## 2022-05-25 DIAGNOSIS — I5022 Chronic systolic (congestive) heart failure: Secondary | ICD-10-CM | POA: Diagnosis not present

## 2022-05-25 DIAGNOSIS — E119 Type 2 diabetes mellitus without complications: Secondary | ICD-10-CM | POA: Insufficient documentation

## 2022-05-25 DIAGNOSIS — F259 Schizoaffective disorder, unspecified: Secondary | ICD-10-CM | POA: Diagnosis not present

## 2022-05-25 DIAGNOSIS — Z8673 Personal history of transient ischemic attack (TIA), and cerebral infarction without residual deficits: Secondary | ICD-10-CM | POA: Insufficient documentation

## 2022-05-25 MED ORDER — VALSARTAN 80 MG PO TABS: 80.0000 mg | ORAL_TABLET | Freq: Every day | ORAL | 5 refills | Status: DC

## 2022-05-25 MED ORDER — GUAIFENESIN ER 600 MG PO TB12: 600.0000 mg | ORAL_TABLET | Freq: Two times a day (BID) | ORAL | 1 refills | Status: DC | PRN

## 2022-05-25 NOTE — Patient Instructions (Addendum)
Continue weighing daily and call for an overnight weight gain of 3 pounds or more or a weekly weight gain of more than 5 pounds.   If you have voicemail, please make sure your mailbox is cleaned out so that we may leave a message and please make sure to listen to any voicemails.    Start taking valsartan 80mg  once daily.    You can take mucinex twice a day if you need it.

## 2022-06-16 NOTE — Progress Notes (Signed)
Cardiology Clinic Note   Patient Name: Daniel [name] Date of Encounter: 06/17/2022  Primary Care Provider:  Anselm Jungling, NP Primary Cardiologist:  Yvonne Kendall, MD  Patient Profile    Daniel [name] is a 32 y.o. male with a past medical history of PAF on anticoagulation, HFmrEF, hypertension, hyperlipidemia, sleep disordered breathing, chronic respiratory failure with hypoxia, seizure disorder, schizoaffective disorder who presents to the clinic today for follow-up of chronic cardiac conditions.  Past Medical History    Past Medical History:  Diagnosis Date   Atrial fibrillation (HCC)    Cardiomyopathy (HCC)    a. 07/2021 Echo: EF 50%, mild LVH, nl RV size/function. No significant valvular disease.   Chest pain    CHF (congestive heart failure) (HCC)    Conversion disorder    Current use of long term anticoagulation    Diabetes mellitus without complication (HCC)    Hyperlipidemia    Hypertension    OSA (obstructive sleep apnea)    Persistent atrial fibrillation and flutter (HCC)    a. CHA2DS2VASc = 3-4   Schizoaffective disorder (HCC)    Seizure disorder (HCC)    TIA (transient ischemic attack)    Urethral stricture    a. 08/2021 s/p cystoscopy and urethral dilation.   Past Surgical History:  Procedure Laterality Date   CYSTOSCOPY WITH URETHRAL DILATATION N/A 09/07/2021   Procedure: CYSTOSCOPY WITH URETHRAL DILATATION CATHETER PLACEMENT;  Surgeon: Riki Altes, MD;  Location: [facility] ORS;  Service: Urology;  Laterality: N/A;    Allergies  Allergies  Allergen Reactions   Haldol [Haloperidol] Other (See Comments)    SI   Dermatitis Antigen Rash   Meperidine     Meperidine   Abilify [Aripiprazole] Palpitations   Demerol [Meperidine Hcl] Hives   Tape Rash    Other reaction(s): Other (See Comments) Itching, skin tear    History of Present Illness    Daniel [name] has a past medical history of: PAF. HFmrEF. Echo 10/22/2021: EF 45 to 50%.  Global  hypokinesis.  A-fib with heart rate >100. Hypertension. Hyperlipidemia. Lipid panel 08/29/2021: LDL 50, HDL 32, TG 255, total 133. Sleep disordered breathing. Chronic respiratory failure with hypoxia. Seizure disorder. Schizoaffective disorder. Conversion disorder.  Daniel [name] was first evaluated by cardiology during ED admission 08/03/2021.  Patient presented to the ED with a 2-day history of chest pain palpitations.  He had recently moved to the area from [place], [place] where he was followed by cardiology for PAF, TIA, hypertension, hyperlipidemia and suspected OSA.  Echo during admission showed EF 50%.  Since that time patient has had multiple ED visits for cardiac and noncardiac complaints.  Of note, patient lives in a group home.  He has suspected OSA however has not been able to complete a sleep study.  He wears 4L O2 at night and sometimes during the day.  He was last seen in the office by Dr. Okey Dupre on 02/09/2022.  At that time he was in sinus rhythm.  He had been working on weight loss through diet and exercise.  He continued to wear 4L of supplemental O2 most of the time. No medication changes were made.  Patient last presented to the ED on 05/22/2022 with complaints of weakness and vomiting.  Several hours after arrival bedside paramedic noted sudden onset of left-sided weakness and numbness.  He underwent stroke workup which was negative.  Today, patient is accompanied by his caregiver from the group home. He reports he is doing well. He is volunteering and  continuing to work on diet and exercise. He is down 6 lb from his 05/25/2022. He is on 4L O2 via [place] today. Denies worse shortness of breath or DOE. No chest pain, pressure, or tightness. Denies lower extremity edema, orthopnea, or PND. No palpitations. He does complain of mild low back pain similar to pain he gets with UTI. Otherwise he is pleased with his weight loss progress. He is waiting to have his sleep study scheduled.     Home  Medications    Current Meds  Medication Sig   acetaminophen (TYLENOL) 650 MG suppository Place 1,300 mg rectally every 4 (four) hours as needed.   albuterol (VENTOLIN HFA) 108 (90 Base) MCG/ACT inhaler Inhale 2 puffs into the lungs every 6 (six) hours as needed for wheezing.   allopurinol (ZYLOPRIM) 300 MG tablet Take 1 tablet (300 mg total) by mouth daily.   atorvastatin (LIPITOR) 80 MG tablet Take 80 mg by mouth daily.   baclofen (LIORESAL) 10 MG tablet Take 1 tablet (10 mg total) by mouth 2 (two) times daily as needed for muscle spasms. Home med.   benztropine (COGENTIN) 1 MG tablet Take 1 mg by mouth daily.   colchicine 0.6 MG tablet Take 1 tablet (0.6 mg total) by mouth daily for 3 days.   cyclobenzaprine (FLEXERIL) 5 MG tablet Take 5 mg by mouth 3 (three) times daily as needed for muscle spasms.   diclofenac Sodium (VOLTAREN) 1 % GEL Apply 2 g topically 4 (four) times daily.   diltiazem (CARDIZEM CD) 360 MG 24 hr capsule Take 1 capsule (360 mg total) by mouth daily.   divalproex (DEPAKOTE) 500 MG DR tablet Take 2 tablets (1,000 mg total) by mouth 2 (two) times daily.   ELIQUIS 5 MG TABS tablet Take 5 mg by mouth 2 (two) times daily.   escitalopram (LEXAPRO) 10 MG tablet Take 20 mg by mouth daily.   FARXIGA 10 MG TABS tablet TAKE 1 TABLET BY MOUTH ONCE DAILY BEFORE BREAKFAST   fluticasone (FLONASE) 50 MCG/ACT nasal spray Place 2 sprays into both nostrils 2 (two) times daily.   furosemide (LASIX) 20 MG tablet Take 1 tablet (20 mg total) by mouth daily.   guaiFENesin (MUCINEX) 600 MG 12 hr tablet Take 1 tablet (600 mg total) by mouth 2 (two) times daily as needed.   hydrOXYzine (ATARAX) 25 MG tablet Take 25 mg by mouth daily. At noon   hydrOXYzine (ATARAX) 50 MG tablet Take 50 mg by mouth in the morning and at bedtime.   ibuprofen (ADVIL) 800 MG tablet Take 800 mg by mouth every 8 (eight) hours as needed.   INVEGA 9 MG 24 hr tablet Take 9 mg by mouth every morning.   lithium carbonate  (LITHOBID) 300 MG CR tablet Take 600 mg by mouth at bedtime as needed.   loratadine (CLARITIN) 10 MG tablet Take 10 mg by mouth daily.   melatonin 3 MG TABS tablet Take 3 mg by mouth at bedtime.   metoprolol tartrate (LOPRESSOR) 100 MG tablet Take 1 tablet (100 mg total) by mouth 2 (two) times daily.   naproxen (NAPROSYN) 500 MG tablet Take 1 tablet (500 mg total) by mouth 2 (two) times daily with a meal.   omeprazole (PRILOSEC) 20 MG capsule Take 20 mg by mouth daily.   tamsulosin (FLOMAX) 0.4 MG CAPS capsule Take 1 capsule (0.4 mg total) by mouth daily.   traMADol (ULTRAM) 50 MG tablet Take 50 mg by mouth every 6 (six) hours as needed.  valsartan (DIOVAN) 80 MG tablet Take 1 tablet (80 mg total) by mouth daily.    Family History    History reviewed. No pertinent family history. He indicated that his mother is alive. He indicated that his father is deceased.   Social History    Social History   Socioeconomic History   Marital status: Single    Spouse name: Not on file   Number of children: Not on file   Years of education: Not on file   Highest education level: Not on file  Occupational History   Not on file  Tobacco Use   Smoking status: Former    Years: 2.00    Types: Cigarettes    Quit date: 08/2021    Years since quitting: 0.8   Smokeless tobacco: Never  Vaping Use   Vaping Use: Never used  Substance and Sexual Activity   Alcohol use: Not Currently   Drug use: Never   Sexual activity: Not on file  Other Topics Concern   Not on file  Social History Narrative   Now living in a group home in [place].   Social Determinants of Health   Financial Resource Strain: Not on file  Food Insecurity: Not on file  Transportation Needs: Not on file  Physical Activity: Not on file  Stress: Not on file  Social Connections: Not on file  Intimate Partner Violence: Not on file     Review of Systems    General:  No chills, fever, night sweats or weight changes.   Cardiovascular:  No chest pain, dyspnea on exertion, edema, orthopnea, palpitations, paroxysmal nocturnal dyspnea. Dermatological: No rash, lesions/masses Respiratory: No cough, dyspnea Urologic: No hematuria, dysuria Abdominal:   No nausea, vomiting, diarrhea, bright red blood per rectum, melena, or hematemesis Neurologic:  No visual changes, weakness, changes in mental status. MSK: Low back pain.  All other systems reviewed and are otherwise negative except as noted above.  Physical Exam    VS:  BP 100/62 (BP Location: Left Arm, Patient Position: Sitting, Cuff Size: Large)   Pulse 73   Ht 6\' 5"  (1.956 m)   Wt (!) 377 lb 2 oz (171.1 kg)   SpO2 99% Comment: oxygen @ 4 Liters  BMI 44.72 kg/m  , BMI Body mass index is 44.72 kg/m. GEN:  Well nourished, well developed, in no acute distress. HEENT: Normal. Neck: Supple, no JVD, carotid bruits, or masses. Cardiac: RRR, no murmurs, rubs, or gallops. No clubbing, cyanosis, edema.  Radials/DP/PT 2+ and equal bilaterally.  Respiratory:  Respirations regular and unlabored, clear to auscultation bilaterally. GI: Soft, nontender, nondistended. MS: No deformity or atrophy. Skin: Warm and dry, no rash. Neuro: Strength and sensation are intact. Psych: Normal affect.  Accessory Clinical Findings    Recent Labs: 12/04/2021: Magnesium 2.3 12/20/2021: TSH 1.976 05/22/2022: ALT 18; B Natriuretic Peptide 11.0; BUN 12; Creatinine, Ser 0.78; Hemoglobin 11.6; Platelets 223; Potassium 4.0; Sodium 138   Recent Lipid Panel    Component Value Date/Time   CHOL 133 08/29/2021 0502   TRIG 255 (H) 08/29/2021 0502   HDL 32 (L) 08/29/2021 0502   CHOLHDL 4.2 08/29/2021 0502   VLDL 51 (H) 08/29/2021 0502   LDLCALC 50 08/29/2021 0502    ECG personally reviewed by me today: NSR, HR 73, nonspecific T-wave abnormality. No significant changes from 05/25/2022.  CHA2DS2-VASc Score = 3   This indicates a 3.2% annual risk of stroke. The patient's score is based  upon: CHF History: 0 HTN History: 1 Diabetes  History: 0 Stroke History: 2 Vascular Disease History: 0 Age Score: 0 Gender Score: 0      Assessment & Plan   PAF  EKG NSR, HR 73. He denies recent palpitations. Continue diltiazem, metoprolol, Eliquis. HFmrEF.  Echo May 2023 EF 45 to 50%.  Patient denies increased shortness of breath or DOE from baseline. He is on 4L O2 via [place] today.  Euvolemic and well compensated on exam. Lungs clear to auscultation. Continue valsartan, metoprolol, Lasix, and Farxiga. Hypertension.  BP today 100/62. Patient denies headaches or dizziness. Continue metoprolol and valsartan. Hyperlipidemia.  LDL April 2023 50, at goal.  Continue Lipitor.      Disposition: Return in 6 months or sooner as needed.    Justice Britain. Himani Corona, DNP, NP-C     06/17/2022, 10:54 AM [place] [address] Office [phone] Fax [phone]

## 2022-06-17 ENCOUNTER — Encounter: Payer: Self-pay | Admitting: Nurse Practitioner

## 2022-06-17 ENCOUNTER — Ambulatory Visit: Payer: Medicaid Other | Attending: Internal Medicine | Admitting: Student

## 2022-06-17 VITALS — BP 100/62 | HR 73 | Ht 77.0 in | Wt 377.1 lb

## 2022-06-17 DIAGNOSIS — E785 Hyperlipidemia, unspecified: Secondary | ICD-10-CM | POA: Diagnosis not present

## 2022-06-17 DIAGNOSIS — I480 Paroxysmal atrial fibrillation: Secondary | ICD-10-CM | POA: Diagnosis present

## 2022-06-17 DIAGNOSIS — I10 Essential (primary) hypertension: Secondary | ICD-10-CM

## 2022-06-17 DIAGNOSIS — I5022 Chronic systolic (congestive) heart failure: Secondary | ICD-10-CM

## 2022-06-17 NOTE — Patient Instructions (Signed)
Medication Instructions:  Your physician recommends that you continue on your current medications as directed. Please refer to the Current Medication list given to you today.  *If you need a refill on your cardiac medications before your next appointment, please call your pharmacy*   Lab Work: No labs ordered  If you have labs (blood work) drawn today and your tests are completely normal, you will receive your results only by: MyChart Message (if you have MyChart) OR A paper copy in the mail If you have any lab test that is abnormal or we need to change your treatment, we will call you to review the results.   Testing/Procedures: No testing ordered  Follow-Up: At [facility], you and your health needs are our priority.  As part of our continuing mission to provide you with exceptional heart care, we have created designated Provider Care Teams.  These Care Teams include your primary Cardiologist (physician) and Advanced Practice Providers (APPs -  Physician Assistants and Nurse Practitioners) who all work together to provide you with the care you need, when you need it.  We recommend signing up for the patient portal called "MyChart".  Sign up information is provided on this After Visit Summary.  MyChart is used to connect with patients for Virtual Visits (Telemedicine).  Patients are able to view lab/test results, encounter notes, upcoming appointments, etc.  Non-urgent messages can be sent to your provider as well.   To learn more about what you can do with MyChart, go to [email or web address].    Your next appointment:   6 month(s)  Provider:   You may see Christopher End, MD or one of the following Advanced Practice Providers on your designated Care Team:   Christopher Berge, NP Ryan Dunn, PA-C Cadence Furth, PA-C Sheri Hammock, NP  

## 2022-06-21 ENCOUNTER — Ambulatory Visit (HOSPITAL_BASED_OUTPATIENT_CLINIC_OR_DEPARTMENT_OTHER): Payer: Medicaid Other | Admitting: Cardiology

## 2022-06-21 ENCOUNTER — Encounter: Payer: Self-pay | Admitting: Cardiology

## 2022-06-21 ENCOUNTER — Other Ambulatory Visit
Admission: RE | Admit: 2022-06-21 | Discharge: 2022-06-21 | Disposition: A | Discharge status: Home / Self Care | Payer: Medicaid Other | Source: Ambulatory Visit | Attending: Cardiology | Admitting: Cardiology

## 2022-06-21 ENCOUNTER — Other Ambulatory Visit (HOSPITAL_COMMUNITY): Payer: Self-pay

## 2022-06-21 ENCOUNTER — Telehealth (HOSPITAL_COMMUNITY): Payer: Self-pay

## 2022-06-21 ENCOUNTER — Telehealth: Payer: Self-pay

## 2022-06-21 VITALS — BP 122/82 | HR 86 | Wt 381.0 lb

## 2022-06-21 DIAGNOSIS — J9611 Chronic respiratory failure with hypoxia: Secondary | ICD-10-CM

## 2022-06-21 DIAGNOSIS — I5022 Chronic systolic (congestive) heart failure: Secondary | ICD-10-CM

## 2022-06-21 DIAGNOSIS — E119 Type 2 diabetes mellitus without complications: Secondary | ICD-10-CM

## 2022-06-21 DIAGNOSIS — I480 Paroxysmal atrial fibrillation: Secondary | ICD-10-CM | POA: Diagnosis not present

## 2022-06-21 LAB — BASIC METABOLIC PANEL
Anion gap: 9 (ref 5–15)
BUN: 14 mg/dL (ref 6–20)
CO2: 31 mmol/L (ref 22–32)
Calcium: 9.1 mg/dL (ref 8.9–10.3)
Chloride: 98 mmol/L (ref 98–111)
Creatinine, Ser: 0.81 mg/dL (ref 0.61–1.24)
GFR, Estimated: >60 mL/min (ref >60)
Glucose, Bld: 85 mg/dL (ref 70–99)
Potassium: 3.9 mmol/L (ref 3.5–5.1)
Sodium: 138 mmol/L (ref 135–145)

## 2022-06-21 LAB — HEMOGLOBIN A1C
Hgb A1c MFr Bld: 5.6 % (ref 4.8–5.6)
Mean Plasma Glucose: 114.02 mg/dL

## 2022-06-21 LAB — BRAIN NATRIURETIC PEPTIDE: B Natriuretic Peptide: 85.2 pg/mL (ref 0.0–100.0)

## 2022-06-21 MED ORDER — ENTRESTO 24-26 MG PO TABS: 1.0000 | ORAL_TABLET | Freq: Two times a day (BID) | ORAL | 5 refills | Status: DC

## 2022-06-21 NOTE — Telephone Encounter (Signed)
Left message requesting call back from [facility] to help arrange for patient to be scheduled for previously ordered sleep study.

## 2022-06-21 NOTE — Progress Notes (Signed)
PCP: Lauretta Grill, NP Cardiology: Dr. Saunders Revel HF Cardiology: Dr. Aundra Dubin  32 y.o. with history of paroxysmal atrial fibrillation, HF with mid range EF, schizoaffective disorder, and chronic hypoxemic respiratory failure on home oxygen presents for evaluation of CHF.  Patient has had a long history of paroxysmal AF.  He says this was diagnosed when he was 20 and he was told that it was triggered by excessive use of energy drinks.  He never abused drugs or ETOH per his report though he was a smoker until 5/23.  He was followed in [place] in the past.  There was talk of atrial fibrillation ablation but it was never undertaken.  Subsequently, he moved to a group home in [place].  He has been followed by [facility].  He was hospitalized in early 2023 with atrial fibrillation and CHF.  Echo in 3/23 showed EF 50% and echo in 5/23 showed EF 45-50%, both instances it appeared he was in atrial fibrillation.  Recently, he has been in NSR it appears.    Patient has been on home oxygen x 2 years per his report.  He has not had recent chest imaging.  He probably has sleep apnea (snores, daytime sleepiness) but has not had a sleep study.  I am unsure what his underlying lung pathology is: ?OHS/OSA, ?COPD, ?post-COVID scarring.   Patient has been doing fairly well recently.  He is in NSR today.  He denies dyspnea as long as he wears his oxygen though not very active.  No chest pain.  Occasional palpitations at night.  No lightheadedness, BP controlled.  Weight has been trending down, 2 lbs down from prior appt.   ECG (personally reviewed): NSR, iRBBB, nonspecific ST changes.   Labs (12/23): K 4, creatinine 0.78, BNP 11  PMH: 1. Gout 2. Atrial fibrillation: Paroxysmal.  First noted at age 19 per his report.  Grandmother and grandfather had AF but at advanced age.  3. HTN 4. OSA: Untreated.  5. Chronic hypoxemic respiratory failure: Patient states he has been using oxygen since 2022.  - Prior smoker, quit  4/23.  6. H/o ?TIA 7. H/o urethral stricture 8. Schizoaffective disorder: Lives in group home.  9. H/o seizure disorder.  10. H/o COVID-19 x 3 11. Hyperlipidemia 12. HF with mid range EF: Echo (3/23) with EF 50%, suspect was in AF.   - Echo (5/23): EF 45-50%, RV normal IVC normal.  In setting of AF.  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  Quit smoking 4/23.  No drugs or ETOH.   FH: Grandfather with MIs, grandmother with AF at advanced age, other grandfather with AF at advanced age.   ROS: All systems reviewed and negative except as per HPI.   Current Outpatient Medications  Medication Sig Dispense Refill   sacubitril-valsartan (ENTRESTO) 24-26 MG Take 1 tablet by mouth 2 (two) times daily. 60 tablet 5   acetaminophen (TYLENOL) 650 MG suppository Place 1,300 mg rectally every 4 (four) hours as needed.     albuterol (VENTOLIN HFA) 108 (90 Base) MCG/ACT inhaler Inhale 2 puffs into the lungs every 6 (six) hours as needed for wheezing. 1 each 2   allopurinol (ZYLOPRIM) 300 MG tablet Take 1 tablet (300 mg total) by mouth daily. 30 tablet 0   atorvastatin (LIPITOR) 80 MG tablet Take 80 mg by mouth daily.     baclofen (LIORESAL) 10 MG tablet Take 1 tablet (10 mg total) by mouth 2 (two) times daily as needed for  muscle spasms. Home med. 30 each 0   benztropine (COGENTIN) 1 MG tablet Take 1 mg by mouth daily.     colchicine 0.6 MG tablet Take 1 tablet (0.6 mg total) by mouth daily for 3 days. 3 tablet 0   cyclobenzaprine (FLEXERIL) 5 MG tablet Take 5 mg by mouth 3 (three) times daily as needed for muscle spasms.     diclofenac Sodium (VOLTAREN) 1 % GEL Apply 2 g topically 4 (four) times daily. 100 g 0   diltiazem (CARDIZEM CD) 360 MG 24 hr capsule Take 1 capsule (360 mg total) by mouth daily. 30 capsule 1   divalproex (DEPAKOTE) 500 MG DR tablet Take 2 tablets (1,000 mg total) by mouth 2 (two) times daily.     ELIQUIS 5 MG TABS tablet Take 5 mg by mouth 2 (two) times daily.      escitalopram (LEXAPRO) 10 MG tablet Take 20 mg by mouth daily.     FARXIGA 10 MG TABS tablet TAKE 1 TABLET BY MOUTH ONCE DAILY BEFORE BREAKFAST 28 tablet 10   fluticasone (FLONASE) 50 MCG/ACT nasal spray Place 2 sprays into both nostrils 2 (two) times daily.     furosemide (LASIX) 20 MG tablet Take 1 tablet (20 mg total) by mouth daily. 30 tablet 0   guaiFENesin (MUCINEX) 600 MG 12 hr tablet Take 1 tablet (600 mg total) by mouth 2 (two) times daily as needed. 60 tablet 1   hydrOXYzine (ATARAX) 25 MG tablet Take 25 mg by mouth daily. At noon     hydrOXYzine (ATARAX) 50 MG tablet Take 50 mg by mouth in the morning and at bedtime.     ibuprofen (ADVIL) 800 MG tablet Take 800 mg by mouth every 8 (eight) hours as needed.     INVEGA 9 MG 24 hr tablet Take 9 mg by mouth every morning.     lithium carbonate (LITHOBID) 300 MG CR tablet Take 600 mg by mouth at bedtime as needed.     loratadine (CLARITIN) 10 MG tablet Take 10 mg by mouth daily.     melatonin 3 MG TABS tablet Take 3 mg by mouth at bedtime.     metoprolol tartrate (LOPRESSOR) 100 MG tablet Take 1 tablet (100 mg total) by mouth 2 (two) times daily. 180 tablet 1   naproxen (NAPROSYN) 500 MG tablet Take 1 tablet (500 mg total) by mouth 2 (two) times daily with a meal. 20 tablet 2   omeprazole (PRILOSEC) 20 MG capsule Take 20 mg by mouth daily.     tamsulosin (FLOMAX) 0.4 MG CAPS capsule Take 1 capsule (0.4 mg total) by mouth daily. 30 capsule 2   traMADol (ULTRAM) 50 MG tablet Take 50 mg by mouth every 6 (six) hours as needed.     No current facility-administered medications for this visit.   BP 122/82   Pulse 86   Wt (!) 381 lb (172.8 kg)   SpO2 99% Comment: 4L of O2  BMI 45.18 kg/m  General: NAD Neck: No JVD, no thyromegaly or thyroid nodule.  Lungs: Clear to auscultation bilaterally with normal respiratory effort. CV: Nondisplaced PMI.  Heart regular S1/S2, no S3/S4, no murmur.  1+ ankle edema.  No carotid bruit.  Normal pedal  pulses.  Abdomen: Soft, nontender, no hepatosplenomegaly, no distention.  Skin: Intact without lesions or rashes.  Neurologic: Alert and oriented x 3.  Psych: Normal affect. Extremities: No clubbing or cyanosis.  HEENT: Normal.   Assessment/Plan: 1. Atrial fibrillation: Paroxysmal.  Onset  at very early age (5 per his report).  He has a family history of AF but others were of advanced age when they had AF.  ?Related to OHS/OSA and obesity.  Also used to abuse energy drinks per his report.  Recently, he seems to be staying in NSR.  - Continue Apixaban.  - He is on diltiazem CD and metoprolol.  I will stop diltiazem CD if EF is low on repeat echo.  - Needs weight loss and treatment of sleep apnea, see below.  - Would pursue atrial fibrillation ablation if he has recurrence of atrial fibrillation once he is on CPAP and has lost additional weight.  2. HF with mid range EF: Echo in 3/23 showed EF 50% and echo in 5/23 showed EF 45-50%, both instances it appeared he was in atrial fibrillation.  He is not volume overloaded on exam today and weight is trending down.  - Repeat echo in sinus rhythm.  - Continue Farxiga 10 mg daily.  - Continue Lasix 20 mg daily. BMET/BNP today.  - Stop valsartan, start Entresto 24/26 bid with BMET in 10 days.  - Would consider RHC in the future depending on results of lung workup (see below).  3. OSA: Strongly suspect with snoring and daytime sleepiness.  - Arrange for sleep study, can do in hospital or at home, whatever his insurance will cover.  4. Obesity: Needs weight loss.  Weight has been trending down.  - I will refer him to pharmacy clinic to see if we can get him on semaglutide or tirzepatide.  5. Chronic hypoxemic respiratory failure: He has been on home oxygen for about 2 years per his report.  No recent lung imaging.  Etiology is uncertain.  Possible OHS/OSA, ?COPD, ?post-COVID scarring (reports 3 episodes of COVID-19).   - I will arrange for PFTs.  - I  will arrange for high resolution CT chest to assess degree of lung parenchymal disease.  - Sleep study as above.  - Refer for pulmonary rehab.   Followup with me after the above testing.   Loralie Champagne 06/21/2022

## 2022-06-21 NOTE — Telephone Encounter (Signed)
Advanced Heart Failure Patient Advocate Encounter  Prior authorization is required for Entresto. PA submitted and APPROVED on 06/21/22.  [place] [employer] conf [id number] Effective: 06/21/22 - 06/21/23  Clista Bernhardt, CPhT Rx Patient Advocate Phone: [phone]

## 2022-06-21 NOTE — Patient Instructions (Addendum)
Labs done today. We will contact you only if your labs are abnormal.  STOP taking Valsartan  START Entresto 24-26mg  (1 tablet) by mouth 2 times daily.   No other medication changes were made. Please continue all current medications as prescribed.  You have been referred to Pulmonary Rehab and to the Pharmacy clinic. They will contact you to schedule an appointment.   We will be in contact with you regarding your sleep study.   Non-Cardiac CT scanning, (CAT scanning), is a noninvasive, special x-ray that produces cross-sectional images of the body using x-rays and a computer. CT scans help physicians diagnose and treat medical conditions. For some CT exams, a contrast material is used to enhance visibility in the area of the body being studied. CT scans provide greater clarity and reveal more details than regular x-ray exams.This has to be approved through your insurance company prior to scheduling, once approved we will contact you to schedule an appointment.   Your physician has recommended that you have a pulmonary function test. Pulmonary Function Tests are a group of tests that measure how well air moves in and out of your lungs. This has to be approved through your insurance company prior to scheduling, once approved we will contact you to schedule an appointment.   Your physician recommends that you schedule a follow-up appointment soon for an echo and in 4-6 weeks with Dr. Aundra Dubin. Please contact our office in February to schedule a March appointment.   Your physician has requested that you have an echocardiogram. Echocardiography is a painless test that uses sound waves to create images of your heart. It provides your doctor with information about the size and shape of your heart and how well your heart's chambers and valves are working. This procedure takes approximately one hour. There are no restrictions for this procedure. Please do NOT wear cologne, perfume, aftershave, or lotions  (deodorant is allowed). Please arrive 15 minutes prior to your appointment time. Check in at the medical mall for this procedure.  If you have any questions or concerns before your next appointment please send [place] a message through [place] or call our office at [phone].    TO LEAVE A MESSAGE FOR THE NURSE SELECT OPTION 2, PLEASE LEAVE A MESSAGE INCLUDING: YOUR NAME DATE OF BIRTH CALL BACK NUMBER REASON FOR CALL**this is important as we prioritize the call backs  YOU WILL RECEIVE A CALL BACK THE SAME DAY AS LONG AS YOU CALL BEFORE 4:00 PM   Do the following things EVERYDAY: Weigh yourself in the morning before breakfast. Write it down and keep it in a log. Take your medicines as prescribed Eat low salt foods--Limit salt (sodium) to 2000 mg per day.  Stay as active as you can everyday Limit all fluids for the day to less than 2 liters   At the [facility], you and your health needs are our priority. As part of our continuing mission to provide you with exceptional heart care, we have created designated Provider Care Teams. These Care Teams include your primary Cardiologist (physician) and Advanced Practice Providers (APPs- Physician Assistants and Nurse Practitioners) who all work together to provide you with the care you need, when you need it.   You may see any of the following providers on your designated Care Team at your next follow up: Dr Glori Bickers Dr Haynes Kerns, NP Lyda Jester, [place] Audry Riles, PharmD   Please be sure to bring in all your medications  bottles to every appointment.

## 2022-06-22 ENCOUNTER — Telehealth: Payer: Self-pay | Admitting: Pharmacist

## 2022-06-22 NOTE — Addendum Note (Signed)
Addended by: Michel Santee on: 06/22/2022 11:11 AM   Modules accepted: Orders

## 2022-06-22 NOTE — Telephone Encounter (Signed)
-----  Message from [place], [place] sent at 06/21/2022 12:02 PM EST ----- Hey Dr. Aundra Dubin wants this patient to start semaglutide

## 2022-06-22 NOTE — Telephone Encounter (Signed)
Pt with dx of T2DM according to one of his ER notes from last year. A1cs in epic have been in prediabetic range. Pt on [place] for his CHF.  Pulled up prior authorization form for Medicaid. GLP1RA not covered unless pt has taken metformin first. Do not see record that pt has tried this.  Left message for pt to discuss.

## 2022-06-24 ENCOUNTER — Emergency Department: Payer: Medicaid Other

## 2022-06-24 ENCOUNTER — Other Ambulatory Visit: Payer: Self-pay

## 2022-06-24 ENCOUNTER — Emergency Department
Admission: EM | Admit: 2022-06-24 | Discharge: 2022-06-24 | Disposition: A | Discharge status: Home / Self Care | Payer: Medicaid Other | Attending: Emergency Medicine | Admitting: Emergency Medicine

## 2022-06-24 DIAGNOSIS — R079 Chest pain, unspecified: Secondary | ICD-10-CM | POA: Diagnosis present

## 2022-06-24 DIAGNOSIS — E119 Type 2 diabetes mellitus without complications: Secondary | ICD-10-CM | POA: Insufficient documentation

## 2022-06-24 DIAGNOSIS — R0789 Other chest pain: Secondary | ICD-10-CM

## 2022-06-24 DIAGNOSIS — I10 Essential (primary) hypertension: Secondary | ICD-10-CM | POA: Insufficient documentation

## 2022-06-24 LAB — CBC
HCT: 39.3 % (ref 39.0–52.0)
Hemoglobin: 12.8 g/dL — ABNORMAL LOW (ref 13.0–17.0)
MCH: 28.2 pg (ref 26.0–34.0)
MCHC: 32.6 g/dL (ref 30.0–36.0)
MCV: 86.6 fL (ref 80.0–100.0)
Platelets: 225 10*3/uL (ref 150–400)
RBC: 4.54 MIL/uL (ref 4.22–5.81)
RDW: 14 % (ref 11.5–15.5)
WBC: 7 10*3/uL (ref 4.0–10.5)
nRBC: 0 % (ref 0.0–0.2)

## 2022-06-24 LAB — BASIC METABOLIC PANEL
Anion gap: 11 (ref 5–15)
BUN: 15 mg/dL (ref 6–20)
CO2: 28 mmol/L (ref 22–32)
Calcium: 8.8 mg/dL — ABNORMAL LOW (ref 8.9–10.3)
Chloride: 98 mmol/L (ref 98–111)
Creatinine, Ser: 0.84 mg/dL (ref 0.61–1.24)
GFR, Estimated: >60 mL/min (ref >60)
Glucose, Bld: 159 mg/dL — ABNORMAL HIGH (ref 70–99)
Potassium: 3.6 mmol/L (ref 3.5–5.1)
Sodium: 137 mmol/L (ref 135–145)

## 2022-06-24 LAB — TROPONIN I (HIGH SENSITIVITY): Troponin I (High Sensitivity): 3 ng/L (ref <18)

## 2022-06-24 NOTE — ED Notes (Signed)
Venora Maples, caregiver, will be here to get pt for d/c

## 2022-06-24 NOTE — ED Triage Notes (Addendum)
Pt to ED via [facility] from [facility]. Pt resides at [facility]. Pt reports left sided CP, dizzy and fell. Staff states this happened after pt was denied a milk shake. Pt wears 4L [place] chronically. 324mg  ASA given PTA. 20g LAC   EMS VS: BP 160/128 96% on 4L [place]  HR 89 NSR

## 2022-06-24 NOTE — ED Provider Notes (Signed)
[facility] Provider Note    Event Date/Time   First MD Initiated Contact with Patient 06/24/22 1843     (approximate)   History   Chest Pain   HPI  Daniel [name] is a 32 y.o. male with a history of atrial fibrillation, cardiomyopathy, diabetes, hypertension, schizoaffective disorder who presents with complaints of chest pain.  Patient apparently complained of chest pain after being denied milkshake.  He is known to our emergency department.  Currently reports he feels well     Physical Exam   Triage Vital Signs: ED Triage Vitals  Enc Vitals Group     BP 06/24/22 1804 138/78     Pulse Rate 06/24/22 1804 86     Resp 06/24/22 1804 18     Temp 06/24/22 1804 98.3 F (36.8 C)     Temp Source 06/24/22 1804 Oral     SpO2 06/24/22 1804 100 %     Weight --      Height --      Head Circumference --      Peak Flow --      Pain Score 06/24/22 1806 7     Pain Loc --      Pain Edu? --      Excl. in [place]? --     Most recent vital signs: Vitals:   06/24/22 1804  BP: 138/78  Pulse: 86  Resp: 18  Temp: 98.3 F (36.8 C)  SpO2: 100%     General: Awake, no distress.  CV:  Good peripheral perfusion.  Resp:  Normal effort.  Abd:  No distention.  Other:  On chronic oxygen   ED Results / Procedures / Treatments   Labs (all labs ordered are listed, but only abnormal results are displayed) Labs Reviewed  BASIC METABOLIC PANEL - Abnormal; Notable for the following components:      Result Value   Glucose, Bld 159 (*)    Calcium 8.8 (*)    All other components within normal limits  CBC - Abnormal; Notable for the following components:   Hemoglobin 12.8 (*)    All other components within normal limits  TROPONIN I (HIGH SENSITIVITY)     EKG  ED ECG REPORT I, Lavonia Drafts, the attending physician, personally viewed and interpreted this ECG.  Date: 06/24/2022  Rhythm: normal sinus rhythm QRS Axis: normal Intervals: Nonspecific  changes ST/T Wave abnormalities: normal Narrative Interpretation: Not significantly changed from prior    RADIOLOGY X-ray viewed interpreted by me, no acute abnormality    PROCEDURES:  Critical Care performed:   Procedures   MEDICATIONS ORDERED IN ED: Medications - No data to display   IMPRESSION / MDM / [place] / ED COURSE  I reviewed the triage vital signs and the nursing notes. Patient's presentation is most consistent with acute presentation with potential threat to life or bodily function.   Patient presents with chest discomfort as above, now asymptomatic.  Patient has a history of complaining of things when he does not get his way.  Regardless he does have a significant history of heart disease.  Likely he is well-appearing today and in no acute distress, he is asymptomatic for me.  Chest x-ray is reassuring, EKG is unchanged from prior.  High sensitive troponin is normal.  No indication for further workup or admission at this time, appropriate for discharge with outpatient follow-up       FINAL CLINICAL IMPRESSION(S) / ED DIAGNOSES   Final diagnoses:  Atypical chest pain     Rx / DC Orders   ED Discharge Orders     None        Note:  This document was prepared using Dragon voice recognition software and may include unintentional dictation errors.   Lavonia Drafts, MD 06/24/22 [phone]

## 2022-06-25 NOTE — ED Notes (Signed)
Pt stormed out of the building while he was waiting on his ride. This RN tried to chase him down and told him he could not leave.   Ride showed up later and went to locate him. Charge RN made aware.

## 2022-06-29 NOTE — Telephone Encounter (Signed)
2nd message left for pt.

## 2022-07-06 ENCOUNTER — Ambulatory Visit: Payer: Medicaid Other | Attending: Otolaryngology

## 2022-07-06 DIAGNOSIS — E669 Obesity, unspecified: Secondary | ICD-10-CM | POA: Diagnosis present

## 2022-07-06 DIAGNOSIS — I10 Essential (primary) hypertension: Secondary | ICD-10-CM | POA: Insufficient documentation

## 2022-07-06 DIAGNOSIS — E118 Type 2 diabetes mellitus with unspecified complications: Secondary | ICD-10-CM | POA: Insufficient documentation

## 2022-07-06 DIAGNOSIS — G4733 Obstructive sleep apnea (adult) (pediatric): Secondary | ICD-10-CM | POA: Insufficient documentation

## 2022-07-06 DIAGNOSIS — J9611 Chronic respiratory failure with hypoxia: Secondary | ICD-10-CM | POA: Diagnosis not present

## 2022-07-06 NOTE — Telephone Encounter (Signed)
3rd attempt made to pt, unable to leave VM on cell, home # not in service. Does not have MyChart set up. Will await his return call at this time, have left 2 prior messages. If he has not tried metformin before, insurance will require him to take this first before covering a GLP.

## 2022-07-07 ENCOUNTER — Other Ambulatory Visit: Payer: Self-pay

## 2022-07-07 ENCOUNTER — Emergency Department
Admission: EM | Admit: 2022-07-07 | Discharge: 2022-07-07 | Disposition: A | Discharge status: Home / Self Care | Payer: Medicaid Other | Attending: Emergency Medicine | Admitting: Emergency Medicine

## 2022-07-07 DIAGNOSIS — Z20822 Contact with and (suspected) exposure to covid-19: Secondary | ICD-10-CM | POA: Insufficient documentation

## 2022-07-07 DIAGNOSIS — I110 Hypertensive heart disease with heart failure: Secondary | ICD-10-CM | POA: Diagnosis not present

## 2022-07-07 DIAGNOSIS — F418 Other specified anxiety disorders: Secondary | ICD-10-CM | POA: Diagnosis not present

## 2022-07-07 DIAGNOSIS — F250 Schizoaffective disorder, bipolar type: Secondary | ICD-10-CM | POA: Diagnosis not present

## 2022-07-07 DIAGNOSIS — E119 Type 2 diabetes mellitus without complications: Secondary | ICD-10-CM | POA: Diagnosis not present

## 2022-07-07 DIAGNOSIS — Z87891 Personal history of nicotine dependence: Secondary | ICD-10-CM | POA: Insufficient documentation

## 2022-07-07 DIAGNOSIS — R45851 Suicidal ideations: Secondary | ICD-10-CM | POA: Insufficient documentation

## 2022-07-07 DIAGNOSIS — F419 Anxiety disorder, unspecified: Secondary | ICD-10-CM

## 2022-07-07 DIAGNOSIS — F32A Depression, unspecified: Secondary | ICD-10-CM

## 2022-07-07 DIAGNOSIS — I509 Heart failure, unspecified: Secondary | ICD-10-CM | POA: Diagnosis not present

## 2022-07-07 LAB — CBC
HCT: 39.9 % (ref 39.0–52.0)
Hemoglobin: 13 g/dL (ref 13.0–17.0)
MCH: 28.2 pg (ref 26.0–34.0)
MCHC: 32.6 g/dL (ref 30.0–36.0)
MCV: 86.6 fL (ref 80.0–100.0)
Platelets: 275 10*3/uL (ref 150–400)
RBC: 4.61 MIL/uL (ref 4.22–5.81)
RDW: 14.2 % (ref 11.5–15.5)
WBC: 8.1 10*3/uL (ref 4.0–10.5)
nRBC: 0 % (ref 0.0–0.2)

## 2022-07-07 LAB — URINE DRUG SCREEN, QUALITATIVE (ARMC ONLY)
Amphetamines, Ur Screen: NOT DETECTED
Barbiturates, Ur Screen: NOT DETECTED
Benzodiazepine, Ur Scrn: NOT DETECTED
Cannabinoid 50 Ng, Ur ~~LOC~~: NOT DETECTED
Cocaine Metabolite,Ur ~~LOC~~: NOT DETECTED
MDMA (Ecstasy)Ur Screen: NOT DETECTED
Methadone Scn, Ur: NOT DETECTED
Opiate, Ur Screen: NOT DETECTED
Phencyclidine (PCP) Ur S: NOT DETECTED
Tricyclic, Ur Screen: POSITIVE — AB

## 2022-07-07 LAB — COMPREHENSIVE METABOLIC PANEL
ALT: 21 U/L (ref 0–44)
AST: 24 U/L (ref 15–41)
Albumin: 4 g/dL (ref 3.5–5.0)
Alkaline Phosphatase: 78 U/L (ref 38–126)
Anion gap: 12 (ref 5–15)
BUN: 17 mg/dL (ref 6–20)
CO2: 27 mmol/L (ref 22–32)
Calcium: 9 mg/dL (ref 8.9–10.3)
Chloride: 95 mmol/L — ABNORMAL LOW (ref 98–111)
Creatinine, Ser: 1.07 mg/dL (ref 0.61–1.24)
GFR, Estimated: >60 mL/min (ref >60)
Glucose, Bld: 129 mg/dL — ABNORMAL HIGH (ref 70–99)
Potassium: 3.9 mmol/L (ref 3.5–5.1)
Sodium: 134 mmol/L — ABNORMAL LOW (ref 135–145)
Total Bilirubin: 0.6 mg/dL (ref 0.3–1.2)
Total Protein: 7.4 g/dL (ref 6.5–8.1)

## 2022-07-07 LAB — RESP PANEL BY RT-PCR (RSV, FLU A&B, COVID)  RVPGX2
Influenza A by PCR: NEGATIVE
Influenza B by PCR: NEGATIVE
Resp Syncytial Virus by PCR: NEGATIVE
SARS Coronavirus 2 by RT PCR: NEGATIVE

## 2022-07-07 LAB — SALICYLATE LEVEL: Salicylate Lvl: <7 mg/dL — ABNORMAL LOW (ref 7.0–30.0)

## 2022-07-07 LAB — ACETAMINOPHEN LEVEL: Acetaminophen (Tylenol), Serum: <10 ug/mL — ABNORMAL LOW (ref 10–30)

## 2022-07-07 LAB — ETHANOL: Alcohol, Ethyl (B): <10 mg/dL (ref <10)

## 2022-07-07 MED ORDER — ACETAMINOPHEN 500 MG PO TABS
ORAL_TABLET | ORAL | Status: AC
  Administered 2022-07-07: 1000 mg via ORAL
  Filled 2022-07-07: qty 2

## 2022-07-07 MED ORDER — HYDROXYZINE HCL 25 MG PO TABS
25.0000 mg | ORAL_TABLET | Freq: Once | ORAL | Status: AC
  Administered 2022-07-07: 25 mg via ORAL

## 2022-07-07 MED ORDER — ACETAMINOPHEN 500 MG PO TABS: 1000.0000 mg | ORAL_TABLET | Freq: Once | ORAL | Status: AC

## 2022-07-07 NOTE — ED Provider Notes (Signed)
   [facility] Provider Note    Event Date/Time   First MD Initiated Contact with Patient 07/07/22 1503     (approximate)  History   Chief Complaint: Anxiety  HPI  Quang Thorpe is a 32 y.o. male with a past medical history of atrial fibrillation cardiomyopathy, diabetes, hypertension, hyperlipidemia, schizophrenia, presents to the emergency department for worsening anxiety depression and suicidal thoughts.  Patient is coming from his group facility, patient is here fairly regularly with medical as well as psychiatric complaints.  He states yesterday he held a knife to his neck and he was going to kill himself but he did not.  Patient states he is just tired of his medical problems and feeling anxious all the time.  Patient is willing to stay voluntarily for psychiatric evaluation.  No medical complaints currently.  Physical Exam   Triage Vital Signs: ED Triage Vitals  Enc Vitals Group     BP 07/07/22 1411 (!) 109/57     Pulse Rate 07/07/22 1411 82     Resp 07/07/22 1411 18     Temp 07/07/22 1411 98.4 F (36.9 C)     Temp Source 07/07/22 1411 Oral     SpO2 07/07/22 1411 97 %     Weight 07/07/22 1412 (!) 370 lb (167.8 kg)     Height 07/07/22 1412 6\' 5"  (1.956 m)     Head Circumference --      Peak Flow --      Pain Score 07/07/22 1412 0     Pain Loc --      Pain Edu? --      Excl. in [place]? --     Most recent vital signs: Vitals:   07/07/22 1411  BP: (!) 109/57  Pulse: 82  Resp: 18  Temp: 98.4 F (36.9 C)  SpO2: 97%    General: Awake, no distress.  CV:  Good peripheral perfusion.  Regular rate and rhythm  Resp:  Normal effort.  Equal breath sounds bilaterally.  Abd:  No distention.  Soft, nontender.  No rebound or guarding. Other:  1+ lower extremity edema bilaterally.   ED Results / Procedures / Treatments   MEDICATIONS ORDERED IN ED: Medications - No data to display   IMPRESSION / MDM / [place] / ED COURSE  I reviewed  the triage vital signs and the nursing notes.  Patient's presentation is most consistent with acute presentation with potential threat to life or bodily function.  Patient presents emergency department for anxiety depression and suicidal thoughts.  Currently patient appears well, has no medical complaints, reassuring physical exam, reassuring vital signs.  We will check labs we will have psychiatry TTS evaluate we will continue to closely monitor while awaiting results.  Patient agreeable to evaluation and is asking for something to eat.  His medical workup is reassuring negative COVID/flu/RSV, normal chemistry, negative acetaminophen salicylate and ethanol.  Normal CBC, TCA positive drug screen otherwise negative.  Patient has been seen and evaluated by psychiatry.  They believe the patient is safe for discharge home from a psychiatric standpoint and does not meet IVC criteria.  Given the patient's medical workup is reassuring we will discharge home with outpatient resources.  FINAL CLINICAL IMPRESSION(S) / ED DIAGNOSES   Depression Anxiety   Note:  This document was prepared using Dragon voice recognition software and may include unintentional dictation errors.   Harvest Dark, MD 07/07/22 1659

## 2022-07-07 NOTE — Consult Note (Addendum)
[place] [facility] Consult   Reason for Consult: Expression of SI Referring Physician: Paduchowski Patient Identification: Daniel [name] MRN:  [id number] Principal Diagnosis: Schizoaffective disorder, bipolar type ([place]) Diagnosis:  Principal Problem:   Schizoaffective disorder, bipolar type ([place])   Total Time spent with patient: 45 minutes  Subjective: "I have congestive heart failure." Daniel [name] is a 32 y.o. male patient admitted with increased depression over health problems.[name]  HPI: Patient presented to the ED with his caregiver.  Patient said he wanted to come to the hospital because he had leg swelling and depression.  Patient is under the care of cardiologist and outpatient providers at [facility] in [place] for his mental health.  On evaluation, patient is pleasant and cooperative, laughing with Probation officer.  He is vague about exactly why he is here, saying he is frustrated over his health conditions and "pain issues."  He says that sometimes he "hears things and sees things.  He has no indication of auditory or visual hallucinations at this time.  He is speaking in clear sentences.  Attention is focused on writer and he is able to answer questions appropriately.  He appears to be focused on wanting to be admitted to the hospital, either psychiatrically or medically.  He believes he should be in "a more structured environment." he continues to tell me about his medical problems and that he has to have a CPAP now.  He says he thinks he should be admitted to the medical floor.  Writer assured him that he was being checked out for medical purposes as well.  Patient then says he is depressed.  He does not indicate anything in particular that made him more depressed.  He said he took a knife out about 6 days ago and was talking to his mother on the phone, who "talked him down."  Caregiver states that he took a knife when he was not allowed to call his family at that particular  time.  The knife was not sharp and patient never attempted to harm himself.   Patient reports that he is going to be getting his CPAP.  Discussed with patient that this may help his mood, if he indeed has sleep apnea.  Patient's affect does not appear to be that of someone who is an imminent danger to himself or others.  It is this writer's judgment that he is safe for discharge home.  He has adequate outpatient services for his medical and psychiatric needs.  Patient had presented to the emergency room on 06/24/2022 after complaining of chest pain after he was denied a milkshake.  He was assessed and did not need admission.  Patient brings this up as thinking "they did not look at me, just sent me home."  Patient assured by writer that he was examined.  Collateral from caregiver, Clayburn Pert, [phone]: Mr. Owens Shark states "Ellard Artis is not going to kill himself."  He reports that the incident 6 days ago where he got a knife was because he was not allowed to call his grandmother.  His phone calls to his family must be limited because every time they talk they just talk about all their health problems.  This makes Ellard Artis more depressed and more focused on his own health problems.  Mr. Owens Shark said it was on a sharp knife and it was taken away from him immediately without any incident.  He did not or nor has he ever tried to harm himself.  Mr. Owens Shark states that patient has been  with him for about a year.  When patient first came patient came to them he was at the hospital several times a week with various complaints.  He has been better but since he has gotten more attention for his congestive heart failure and sleep study, he is focused on being at the hospital.  Mr. Owens Shark states that he is able to keep any objects that patient could harm himself with away from him.    He is comfortable for patient to be discharged home.  Past Psychiatric History: Schizoaffective disorder, bipolar type  Risk to Self:   Risk to Others:    Prior Inpatient Therapy:   Prior Outpatient Therapy:    Past Medical History:  Past Medical History:  Diagnosis Date   Atrial fibrillation ([place])    Cardiomyopathy ([place])    a. 07/2021 Echo: EF 50%, mild LVH, nl RV size/function. No significant valvular disease.   Chest pain    CHF (congestive heart failure) (HCC)    Conversion disorder    Current use of long term anticoagulation    Diabetes mellitus without complication (HCC)    Hyperlipidemia    Hypertension    OSA (obstructive sleep apnea)    Persistent atrial fibrillation and flutter ([place])    a. CHA2DS2VASc = 3-4   Schizoaffective disorder (HCC)    Seizure disorder (HCC)    TIA (transient ischemic attack)    Urethral stricture    a. 08/2021 s/p cystoscopy and urethral dilation.    Past Surgical History:  Procedure Laterality Date   CYSTOSCOPY WITH URETHRAL DILATATION N/A 09/07/2021   Procedure: CYSTOSCOPY WITH URETHRAL DILATATION CATHETER PLACEMENT;  Surgeon: Abbie Sons, MD;  Location: [facility] ORS;  Service: Urology;  Laterality: N/A;   Family History: History reviewed. No pertinent family history. Family Psychiatric  History:  Social History:  Social History   Substance and Sexual Activity  Alcohol Use Not Currently     Social History   Substance and Sexual Activity  Drug Use Never    Social History   Socioeconomic History   Marital status: Single    Spouse name: Not on file   Number of children: Not on file   Years of education: Not on file   Highest education level: Not on file  Occupational History   Not on file  Tobacco Use   Smoking status: Former    Years: 2.00    Types: Cigarettes    Quit date: 08/2021    Years since quitting: 0.8   Smokeless tobacco: Never  Vaping Use   Vaping Use: Never used  Substance and Sexual Activity   Alcohol use: Not Currently   Drug use: Never   Sexual activity: Not on file  Other Topics Concern   Not on file  Social History Narrative   Now living in a group  home in [place].   Social Determinants of Health   Financial Resource Strain: Not on file  Food Insecurity: Not on file  Transportation Needs: Not on file  Physical Activity: Not on file  Stress: Not on file  Social Connections: Not on file   Additional Social History:    Allergies:   Allergies  Allergen Reactions   Haldol [Haloperidol] Other (See Comments)    SI   Dermatitis Antigen Rash   Meperidine     Meperidine   Abilify [Aripiprazole] Palpitations   Demerol [Meperidine Hcl] Hives   Tape Rash    Other reaction(s): Other (See Comments) Itching, skin tear  Labs:  Results for orders placed or performed during the hospital encounter of 07/07/22 (from the past 48 hour(s))  Urine Drug Screen, Qualitative     Status: Abnormal   Collection Time: 07/07/22  2:14 PM  Result Value Ref Range   Tricyclic, Ur Screen POSITIVE (A) NONE DETECTED   Amphetamines, Ur Screen NONE DETECTED NONE DETECTED   MDMA (Ecstasy)Ur Screen NONE DETECTED NONE DETECTED   Cocaine Metabolite,Ur [place] NONE DETECTED NONE DETECTED   Opiate, Ur Screen NONE DETECTED NONE DETECTED   Phencyclidine (PCP) Ur S NONE DETECTED NONE DETECTED   Cannabinoid 50 Ng, Ur [place] NONE DETECTED NONE DETECTED   Barbiturates, Ur Screen NONE DETECTED NONE DETECTED   Benzodiazepine, Ur Scrn NONE DETECTED NONE DETECTED   Methadone Scn, Ur NONE DETECTED NONE DETECTED    Comment: (NOTE) Tricyclics + metabolites, urine    Cutoff 1000 ng/mL Amphetamines + metabolites, urine  Cutoff 1000 ng/mL MDMA (Ecstasy), urine              Cutoff 500 ng/mL Cocaine Metabolite, urine          Cutoff 300 ng/mL Opiate + metabolites, urine        Cutoff 300 ng/mL Phencyclidine (PCP), urine         Cutoff 25 ng/mL Cannabinoid, urine                 Cutoff 50 ng/mL Barbiturates + metabolites, urine  Cutoff 200 ng/mL Benzodiazepine, urine              Cutoff 200 ng/mL Methadone, urine                   Cutoff 300 ng/mL  The urine drug screen provides  only a preliminary, unconfirmed analytical test result and should not be used for non-medical purposes. Clinical consideration and professional judgment should be applied to any positive drug screen result due to possible interfering substances. A more specific alternate chemical method must be used in order to obtain a confirmed analytical result. Gas chromatography / mass spectrometry (GC/MS) is the preferred confirm atory method. Performed at [facility], [address] [place], [place] [address]   Comprehensive metabolic panel     Status: Abnormal   Collection Time: 07/07/22  2:26 PM  Result Value Ref Range   Sodium 134 (L) 135 - 145 mmol/L   Potassium 3.9 3.5 - 5.1 mmol/L   Chloride 95 (L) 98 - 111 mmol/L   CO2 27 22 - 32 mmol/L   Glucose, Bld 129 (H) 70 - 99 mg/dL    Comment: Glucose reference range applies only to samples taken after fasting for at least 8 hours.   BUN 17 6 - 20 mg/dL   Creatinine, Ser 1.07 0.61 - 1.24 mg/dL   Calcium 9.0 8.9 - 10.3 mg/dL   Total Protein 7.4 6.5 - 8.1 g/dL   Albumin 4.0 3.5 - 5.0 g/dL   AST 24 15 - 41 U/L   ALT 21 0 - 44 U/L   Alkaline Phosphatase 78 38 - 126 U/L   Total Bilirubin 0.6 0.3 - 1.2 mg/dL   GFR, Estimated >60 >60 mL/min    Comment: (NOTE) Calculated using the CKD-EPI Creatinine Equation (2021)    Anion gap 12 5 - 15    Comment: Performed at [facility], [address], [place], [place] [address]  Ethanol     Status: None   Collection Time: 07/07/22  2:26 PM  Result Value Ref  Range   Alcohol, Ethyl (B) <10 <10 mg/dL    Comment: (NOTE) Lowest detectable limit for serum alcohol is 10 mg/dL.  For medical purposes only. Performed at [facility], [address] [place], [place] [id number]   Salicylate level     Status: Abnormal   Collection Time: 07/07/22  2:26 PM  Result Value Ref Range   Salicylate Lvl Q000111Q (L) 7.0 - 30.0 mg/dL    Comment: Performed at [facility], [address] [place], [place] [address]  Acetaminophen level     Status: Abnormal   Collection Time: 07/07/22  2:26 PM  Result Value Ref Range   Acetaminophen (Tylenol), Serum <10 (L) 10 - 30 ug/mL    Comment: (NOTE) Therapeutic concentrations vary significantly. A range of 10-30 ug/mL  may be an effective concentration for many patients. However, some  are best treated at concentrations outside of this range. Acetaminophen concentrations >150 ug/mL at 4 hours after ingestion  and >50 ug/mL at 12 hours after ingestion are often associated with  toxic reactions.  Performed at [facility], [address] [place], [place] [address]   cbc     Status: None   Collection Time: 07/07/22  2:26 PM  Result Value Ref Range   WBC 8.1 4.0 - 10.5 K/uL   RBC 4.61 4.22 - 5.81 MIL/uL   Hemoglobin 13.0 13.0 - 17.0 g/dL   HCT 39.9 39.0 - 52.0 %   MCV 86.6 80.0 - 100.0 fL   MCH 28.2 26.0 - 34.0 pg   MCHC 32.6 30.0 - 36.0 g/dL   RDW 14.2 11.5 - 15.5 %   Platelets 275 150 - 400 K/uL   nRBC 0.0 0.0 - 0.2 %    Comment: Performed at [facility], [address], [place],  [address]  Resp panel by RT-PCR (RSV, Flu A&B, Covid) Anterior Nasal Swab     Status: None   Collection Time: 07/07/22  3:37 PM   Specimen: Anterior Nasal Swab  Result Value Ref Range   SARS Coronavirus 2 by RT PCR NEGATIVE NEGATIVE    Comment: (NOTE) SARS-CoV-2 target nucleic acids are NOT DETECTED.  The SARS-CoV-2 RNA is generally detectable in upper respiratory specimens during the acute phase of infection. The lowest concentration of SARS-CoV-2 viral copies this assay can detect is 138 copies/mL. A negative result does not preclude SARS-Cov-2 infection and should not be used as the sole basis for treatment or other patient management decisions. A negative result may occur with  improper specimen collection/handling, submission of specimen other than nasopharyngeal swab, presence of viral  mutation(s) within the areas targeted by this assay, and inadequate number of viral copies(<138 copies/mL). A negative result must be combined with clinical observations, patient history, and epidemiological information. The expected result is Negative.  Fact Sheet for Patients:  EntrepreneurPulse.com.au  Fact Sheet for Healthcare Providers:  IncredibleEmployment.be  This test is no t yet approved or cleared by the [place] FDA and  has been authorized for detection and/or diagnosis of SARS-CoV-2 by FDA under an Emergency Use Authorization (EUA). This EUA will remain  in effect (meaning this test can be used) for the duration of the COVID-19 declaration under Section 564(b)(1) of the Act, 21 U.S.C.section 360bbb-3(b)(1), unless the authorization is terminated  or revoked sooner.       Influenza A by PCR NEGATIVE NEGATIVE   Influenza B by PCR NEGATIVE NEGATIVE    Comment: (NOTE) The Xpert Xpress SARS-CoV-2/FLU/RSV plus  assay is intended as an aid in the diagnosis of influenza from Nasopharyngeal swab specimens and should not be used as a sole basis for treatment. Nasal washings and aspirates are unacceptable for Xpert Xpress SARS-CoV-2/FLU/RSV testing.  Fact Sheet for Patients: EntrepreneurPulse.com.au  Fact Sheet for Healthcare Providers: IncredibleEmployment.be  This test is not yet approved or cleared by the [place] FDA and has been authorized for detection and/or diagnosis of SARS-CoV-2 by FDA under an Emergency Use Authorization (EUA). This EUA will remain in effect (meaning this test can be used) for the duration of the COVID-19 declaration under Section 564(b)(1) of the Act, 21 U.S.C. section 360bbb-3(b)(1), unless the authorization is terminated or revoked.     Resp Syncytial Virus by PCR NEGATIVE NEGATIVE    Comment: (NOTE) Fact Sheet for  Patients: EntrepreneurPulse.com.au  Fact Sheet for Healthcare Providers: IncredibleEmployment.be  This test is not yet approved or cleared by the [place] FDA and has been authorized for detection and/or diagnosis of SARS-CoV-2 by FDA under an Emergency Use Authorization (EUA). This EUA will remain in effect (meaning this test can be used) for the duration of the COVID-19 declaration under Section 564(b)(1) of the Act, 21 U.S.C. section 360bbb-3(b)(1), unless the authorization is terminated or revoked.  Performed at [facility], [address] [place], [place] [address]     No current facility-administered medications for this encounter.   Current Outpatient Medications  Medication Sig Dispense Refill   acetaminophen (TYLENOL) 650 MG suppository Place 1,300 mg rectally every 4 (four) hours as needed.     albuterol (VENTOLIN HFA) 108 (90 Base) MCG/ACT inhaler Inhale 2 puffs into the lungs every 6 (six) hours as needed for wheezing. 1 each 2   allopurinol (ZYLOPRIM) 300 MG tablet Take 1 tablet (300 mg total) by mouth daily. 30 tablet 0   atorvastatin (LIPITOR) 80 MG tablet Take 80 mg by mouth daily.     baclofen (LIORESAL) 10 MG tablet Take 1 tablet (10 mg total) by mouth 2 (two) times daily as needed for muscle spasms. Home med. 30 each 0   benztropine (COGENTIN) 1 MG tablet Take 1 mg by mouth daily.     colchicine 0.6 MG tablet Take 1 tablet (0.6 mg total) by mouth daily for 3 days. 3 tablet 0   cyclobenzaprine (FLEXERIL) 5 MG tablet Take 5 mg by mouth 3 (three) times daily as needed for muscle spasms.     diclofenac Sodium (VOLTAREN) 1 % GEL Apply 2 g topically 4 (four) times daily. 100 g 0   diltiazem (CARDIZEM CD) 360 MG 24 hr capsule Take 1 capsule (360 mg total) by mouth daily. 30 capsule 1   divalproex (DEPAKOTE) 500 MG DR tablet Take 2 tablets (1,000 mg total) by mouth 2 (two) times daily.     ELIQUIS 5 MG TABS tablet Take 5  mg by mouth 2 (two) times daily.     escitalopram (LEXAPRO) 10 MG tablet Take 20 mg by mouth daily.     FARXIGA 10 MG TABS tablet TAKE 1 TABLET BY MOUTH ONCE DAILY BEFORE BREAKFAST 28 tablet 10   fluticasone (FLONASE) 50 MCG/ACT nasal spray Place 2 sprays into both nostrils 2 (two) times daily.     furosemide (LASIX) 20 MG tablet Take 1 tablet (20 mg total) by mouth daily. 30 tablet 0   guaiFENesin (MUCINEX) 600 MG 12 hr tablet Take 1 tablet (600 mg total) by mouth 2 (two) times daily as needed. 60 tablet 1  hydrOXYzine (ATARAX) 25 MG tablet Take 25 mg by mouth daily. At noon     hydrOXYzine (ATARAX) 50 MG tablet Take 50 mg by mouth in the morning and at bedtime.     ibuprofen (ADVIL) 800 MG tablet Take 800 mg by mouth every 8 (eight) hours as needed.     INVEGA 9 MG 24 hr tablet Take 9 mg by mouth every morning.     lithium carbonate (LITHOBID) 300 MG CR tablet Take 600 mg by mouth at bedtime as needed.     loratadine (CLARITIN) 10 MG tablet Take 10 mg by mouth daily.     melatonin 3 MG TABS tablet Take 3 mg by mouth at bedtime.     metoprolol tartrate (LOPRESSOR) 100 MG tablet Take 1 tablet (100 mg total) by mouth 2 (two) times daily. 180 tablet 1   naproxen (NAPROSYN) 500 MG tablet Take 1 tablet (500 mg total) by mouth 2 (two) times daily with a meal. 20 tablet 2   omeprazole (PRILOSEC) 20 MG capsule Take 20 mg by mouth daily.     sacubitril-valsartan (ENTRESTO) 24-26 MG Take 1 tablet by mouth 2 (two) times daily. 60 tablet 5   tamsulosin (FLOMAX) 0.4 MG CAPS capsule Take 1 capsule (0.4 mg total) by mouth daily. 30 capsule 2   traMADol (ULTRAM) 50 MG tablet Take 50 mg by mouth every 6 (six) hours as needed.      Musculoskeletal: Strength & Muscle Tone: within normal limits Gait & Station:  Not assessed Patient leans: N/A   Psychiatric Specialty Exam:  Presentation  General Appearance:  Appropriate for Environment  Eye Contact: Good  Speech: Clear and Coherent  Speech  Volume: Normal  Handedness: Right   Mood and Affect  Mood: Euthymic  Affect: Congruent   Thought Process  Thought Processes: Coherent  Descriptions of Associations:Intact  Orientation:Full (Time, Place and Person)  Thought Content:WDL  History of Schizophrenia/Schizoaffective disorder:No  Duration of Psychotic Symptoms:Less than six months  Hallucinations:Hallucinations: None  Ideas of Reference:None  Suicidal Thoughts:Suicidal Thoughts: Yes, Passive SI Passive Intent and/or Plan: Without Intent (Appears to be at baseline)  Homicidal Thoughts:Homicidal Thoughts: No   Sensorium  Memory: Immediate Fair  Judgment: Fair  Insight: Poor   Executive Functions  Concentration: Fair  Attention Span: Fair  Recall: [place] of Knowledge: Fair  Language: Fair   Psychomotor Activity  Psychomotor Activity:Psychomotor Activity: Normal   Assets  Assets: Housing; Catering manager; Desire for Improvement; Resilience; Social Support   Sleep  Sleep:Sleep: Good   Physical Exam: Physical Exam Vitals and nursing note reviewed.  HENT:     Head: Normocephalic.     Nose: No congestion or rhinorrhea.     Mouth/Throat:     Pharynx: No oropharyngeal exudate or posterior oropharyngeal erythema.  Cardiovascular:     Rate and Rhythm: Normal rate.  Pulmonary:     Effort: Pulmonary effort is normal.  Musculoskeletal:        General: Normal range of motion.     Cervical back: Normal range of motion.     Right lower leg: Edema present.     Left lower leg: Edema present.  Skin:    General: Skin is dry.  Neurological:     Mental Status: He is alert and oriented to person, place, and time.  Psychiatric:        Attention and Perception: Attention normal.        Mood and Affect: Mood normal.  Speech: Speech normal.        Behavior: Behavior is cooperative.        Thought Content: Thought content is not paranoid. Thought content  includes suicidal (appears passive at baseline per chart review d/t health issues and being in [facility]) ideation. Thought content does not include homicidal ideation.        Judgment: Judgment normal.    Review of Systems  Respiratory: Negative.    Musculoskeletal:        Complains of leg pain and some back pain  Skin: Negative.   Psychiatric/Behavioral:  Positive for depression (Stable for discharge).        Schizoaffective disorder, bipolar type   Blood pressure (!) 109/57, pulse 82, temperature 98.4 F (36.9 C), temperature source Oral, resp. rate 18, height 6' 5"$  (1.956 m), weight (!) 167.8 kg, SpO2 97 %. Body mass index is 43.88 kg/m.  Treatment Plan Summary: Plan Continue with outpatient  mental health providers, [facility]. Patient had blood drawn elsewhere today, ordered by [facility] for valproic acid and lithium level and caregiver will follow up to see if he needs a med change.     Disposition: No evidence of imminent risk to self or others at present.   Patient does not meet criteria for psychiatric inpatient admission. Supportive therapy provided about ongoing stressors. Discussed crisis plan, support from social network, calling 911, coming to the Emergency Department, and calling Suicide Hotline.  Sherlon Handing, NP 07/07/2022 5:37 PM

## 2022-07-07 NOTE — ED Notes (Addendum)
Pt states he has CHF and wears 4 L O2 every day and CPAP at night, pt 96-97% on room air at this time with no SOB noted. Staff member mentions that pt likes to make up medical issues and say he is SOB when his O2 level is fine.  This RN notes swollen legs bilaterally.     Belongings:  Pair of black shoes,white socks, black suspenders, red shirt, tan pants. Black steelers wallet, black knee brace, plaid boxer shorts,  Belongings placed in bag and labeled with pt's name and sticker.   Pink case with coins, radio given to OJ, staff member.

## 2022-07-07 NOTE — ED Notes (Signed)
VOL  CONSULT  DONE   

## 2022-07-07 NOTE — Discharge Instructions (Signed)
You have been seen in the emergency department for a  psychiatric concern. You have been evaluated both medically as well as psychiatrically. Please follow-up with your outpatient resources provided. Return to the emergency department for any worsening symptoms, or any thoughts of hurting yourself or anyone else so that we may attempt to help you.

## 2022-07-07 NOTE — ED Notes (Signed)
OJ from group home here to take patient home. E-signature not working at this time. Pt caregiver verbalized understanding of D/C instructions, prescriptions and follow up care with no further questions at this time. Pt in NAD and ambulatory at time of D/C.

## 2022-07-07 NOTE — ED Notes (Signed)
TTS at bedside. 

## 2022-07-07 NOTE — ED Notes (Signed)
Pt reports depressed due to all his "health issues". Pt reports does have a positive thing to look forward to in life as he recently had a cousin born. When asked why pt is here, pt brings up leg swelling and depression- but group home worker reports he was just seen at cardiologist last week and is followed regularly. Pt is currently on lasix therapy. Per group home worker report, pt is often seeking medical care and "assuming something is always wrong with him" & is frequently visiting multiple hospitals. Pt respirations even and unlabored at this time. 2+ pitting edema in bilateral legs. Pt able to ambulate independently.
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
## 2022-07-07 NOTE — ED Notes (Signed)
OJ from group home number [phone]. Requests call upon dispo- has to go back to group home as there are other clients in the house.
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
## 2022-07-11 ENCOUNTER — Ambulatory Visit
Admission: RE | Admit: 2022-07-11 | Discharge: 2022-07-11 | Disposition: A | Discharge status: Home / Self Care | Payer: Medicaid Other | Source: Ambulatory Visit | Attending: Cardiology | Admitting: Cardiology

## 2022-07-11 DIAGNOSIS — J9611 Chronic respiratory failure with hypoxia: Secondary | ICD-10-CM | POA: Insufficient documentation

## 2022-07-16 ENCOUNTER — Other Ambulatory Visit: Payer: Self-pay | Admitting: Internal Medicine

## 2022-07-20 ENCOUNTER — Ambulatory Visit (INDEPENDENT_AMBULATORY_CARE_PROVIDER_SITE_OTHER): Payer: Medicaid Other | Admitting: Physician Assistant

## 2022-07-20 ENCOUNTER — Ambulatory Visit: Payer: Medicaid Other

## 2022-07-20 ENCOUNTER — Telehealth (INDEPENDENT_AMBULATORY_CARE_PROVIDER_SITE_OTHER): Payer: Medicaid Other | Admitting: Pulmonary Disease

## 2022-07-20 VITALS — BP 122/79 | HR 82 | Ht 77.0 in | Wt 370.0 lb

## 2022-07-20 DIAGNOSIS — G8929 Other chronic pain: Secondary | ICD-10-CM | POA: Diagnosis not present

## 2022-07-20 DIAGNOSIS — G4733 Obstructive sleep apnea (adult) (pediatric): Secondary | ICD-10-CM | POA: Diagnosis not present

## 2022-07-20 DIAGNOSIS — M545 Low back pain, unspecified: Secondary | ICD-10-CM

## 2022-07-20 DIAGNOSIS — R300 Dysuria: Secondary | ICD-10-CM | POA: Diagnosis not present

## 2022-07-20 LAB — URINALYSIS, COMPLETE
Bilirubin, UA: NEGATIVE
Ketones, UA: NEGATIVE
Leukocytes,UA: NEGATIVE
Nitrite, UA: NEGATIVE
Protein,UA: NEGATIVE
RBC, UA: NEGATIVE
Specific Gravity, UA: 1.015 (ref 1.005–1.030)
Urobilinogen, Ur: 1 mg/dL (ref 0.2–1.0)
pH, UA: 6 (ref 5.0–7.5)

## 2022-07-20 LAB — BLADDER SCAN AMB NON-IMAGING: Scan Result: 1 mL

## 2022-07-20 LAB — MICROSCOPIC EXAMINATION: Epithelial Cells (non renal): >10 /hpf — AB (ref 0–10)

## 2022-07-20 NOTE — Telephone Encounter (Signed)
Split-night study showed severe OSA, AHI 143/hour with severe desaturation 63%.  Corrected by BiPAP 20/20 cm with large fullface mask. Suggest auto bilevel, EPAP minimum 12, pressure support +4, IPAP maximum 24 Obtain download after 30 days of use and tweak settings as needed

## 2022-07-20 NOTE — Progress Notes (Unsigned)
07/20/2022 1:46 PM   Daniel [name] September 01, 1990 [id number]  CC: Chief Complaint  Patient presents with   Follow-up   HPI: Daniel [name] is a 32 y.o. male with PMH bulbar urethral stricture s/p dilation in 2023, chronic A-fib on Eliquis, CHF, schizoaffective disorder, diabetes on Farxiga, and conversion disorder with frequent ED visits who presents today for evaluation of possible UTI.   Today he reports frequent UTIs treated in the emergency department.  He had some dysuria overnight earlier this week and was concerned for another infection.  He describes his UTI symptoms as occasional dysuria and weak stream without split stream.  He also complains of bilateral low back pain that is worse upon awakening and exacerbated with bending, movement, and heavy lifting.  He remains on Flomax once daily.  Urine culture data as follows: 03/17/2022: No growth 02/14/2022: Insignificant growth 11/28/2021: No growth 11/22/2021: Multiple species present 11/19/2021: Multiple species present 09/17/2021: No growth 09/15/2021: No growth 09/12/2021: No growth 09/07/2021: No growth 08/31/2021: Low colony counts Diphtheroids and Staph simulans  STI testing as follows: 11/22/2021: Gonorrhea, chlamydia negative  In-office UA today positive for 2+ glucose; urine microscopy with >10 epithelial cells/hpf.  PVR 1 mL.  PMH: Past Medical History:  Diagnosis Date   Atrial fibrillation ([place])    Cardiomyopathy ([place])    a. 07/2021 Echo: EF 50%, mild LVH, nl RV size/function. No significant valvular disease.   Chest pain    CHF (congestive heart failure) (HCC)    Conversion disorder    Current use of long term anticoagulation    Diabetes mellitus without complication (HCC)    Hyperlipidemia    Hypertension    OSA (obstructive sleep apnea)    Persistent atrial fibrillation and flutter ([place])    a. CHA2DS2VASc = 3-4   Schizoaffective disorder (HCC)    Seizure disorder (HCC)    TIA (transient ischemic attack)     Urethral stricture    a. 08/2021 s/p cystoscopy and urethral dilation.    Surgical History: Past Surgical History:  Procedure Laterality Date   CYSTOSCOPY WITH URETHRAL DILATATION N/A 09/07/2021   Procedure: CYSTOSCOPY WITH URETHRAL DILATATION CATHETER PLACEMENT;  Surgeon: Abbie Sons, MD;  Location: [facility] ORS;  Service: Urology;  Laterality: N/A;    Home Medications:  Allergies as of 07/20/2022       Reactions   Haldol [haloperidol] Other (See Comments)   SI   Dermatitis Antigen Rash   Meperidine    Meperidine   Abilify [aripiprazole] Palpitations   Demerol [meperidine Hcl] Hives   Tape Rash   Other reaction(s): Other (See Comments) Itching, skin tear        Medication List        Accurate as of July 20, 2022  1:46 PM. If you have any questions, ask your nurse or doctor.          acetaminophen 650 MG suppository Commonly known as: TYLENOL Place 1,300 mg rectally every 4 (four) hours as needed.   albuterol 108 (90 Base) MCG/ACT inhaler Commonly known as: VENTOLIN HFA Inhale 2 puffs into the lungs every 6 (six) hours as needed for wheezing.   allopurinol 300 MG tablet Commonly known as: ZYLOPRIM Take 1 tablet (300 mg total) by mouth daily.   atorvastatin 80 MG tablet Commonly known as: LIPITOR Take 80 mg by mouth daily.   baclofen 10 MG tablet Commonly known as: LIORESAL Take 1 tablet (10 mg total) by mouth 2 (two) times daily as needed for muscle spasms.  Home med.   benztropine 1 MG tablet Commonly known as: COGENTIN Take 1 mg by mouth daily.   colchicine 0.6 MG tablet Take 1 tablet (0.6 mg total) by mouth daily for 3 days.   cyclobenzaprine 5 MG tablet Commonly known as: FLEXERIL Take 5 mg by mouth 3 (three) times daily as needed for muscle spasms.   diclofenac Sodium 1 % Gel Commonly known as: VOLTAREN Apply 2 g topically 4 (four) times daily.   diltiazem 360 MG 24 hr capsule Commonly known as: CARDIZEM CD Take 1 capsule (360 mg  total) by mouth daily.   divalproex 500 MG DR tablet Commonly known as: DEPAKOTE Take 2 tablets (1,000 mg total) by mouth 2 (two) times daily.   Eliquis 5 MG Tabs tablet Generic drug: apixaban Take 5 mg by mouth 2 (two) times daily.   Entresto 24-26 MG Generic drug: sacubitril-valsartan Take 1 tablet by mouth 2 (two) times daily.   escitalopram 10 MG tablet Commonly known as: LEXAPRO Take 20 mg by mouth daily.   Farxiga 10 MG Tabs tablet Generic drug: dapagliflozin propanediol TAKE 1 TABLET BY MOUTH ONCE DAILY BEFORE BREAKFAST   fluticasone 50 MCG/ACT nasal spray Commonly known as: FLONASE Place 2 sprays into both nostrils 2 (two) times daily.   furosemide 20 MG tablet Commonly known as: LASIX Take 1 tablet (20 mg total) by mouth daily.   guaiFENesin 600 MG 12 hr tablet Commonly known as: MUCINEX Take 1 tablet (600 mg total) by mouth 2 (two) times daily as needed.   hydrOXYzine 50 MG tablet Commonly known as: ATARAX Take 50 mg by mouth in the morning and at bedtime.   hydrOXYzine 25 MG tablet Commonly known as: ATARAX Take 25 mg by mouth daily. At noon   ibuprofen 800 MG tablet Commonly known as: ADVIL Take 800 mg by mouth every 8 (eight) hours as needed.   Invega 9 MG 24 hr tablet Generic drug: paliperidone Take 9 mg by mouth every morning.   lithium carbonate 300 MG ER tablet Commonly known as: LITHOBID Take 600 mg by mouth at bedtime as needed.   loratadine 10 MG tablet Commonly known as: CLARITIN Take 10 mg by mouth daily.   melatonin 3 MG Tabs tablet Take 3 mg by mouth at bedtime.   metoprolol tartrate 100 MG tablet Commonly known as: LOPRESSOR Take 1 tablet (100 mg total) by mouth 2 (two) times daily.   naproxen 500 MG tablet Commonly known as: Naprosyn Take 1 tablet (500 mg total) by mouth 2 (two) times daily with a meal.   omeprazole 20 MG capsule Commonly known as: PRILOSEC Take 20 mg by mouth daily.   tamsulosin 0.4 MG Caps  capsule Commonly known as: FLOMAX Take 1 capsule (0.4 mg total) by mouth daily.   traMADol 50 MG tablet Commonly known as: ULTRAM Take 50 mg by mouth every 6 (six) hours as needed.        Allergies:  Allergies  Allergen Reactions   Haldol [Haloperidol] Other (See Comments)    SI   Dermatitis Antigen Rash   Meperidine     Meperidine   Abilify [Aripiprazole] Palpitations   Demerol [Meperidine Hcl] Hives   Tape Rash    Other reaction(s): Other (See Comments) Itching, skin tear    Family History: No family history on file.  Social History:   reports that he quit smoking about 10 months ago. His smoking use included cigarettes. He has never used smokeless tobacco. He reports that he  does not currently use alcohol. He reports that he does not use drugs.  Physical Exam: BP 122/79   Pulse 82   Ht 6' 5"$  (1.956 m)   Wt (!) 370 lb (167.8 kg)   BMI 43.88 kg/m   Constitutional:  Alert and oriented, no acute distress, nontoxic appearing HEENT: [place], AT Cardiovascular: No clubbing, cyanosis, or edema Respiratory: Normal respiratory effort, no increased work of breathing Skin: No rashes, bruises or suspicious lesions Neurologic: Grossly intact, no focal deficits, moving all 4 extremities Psychiatric: Normal mood and affect  Laboratory Data: Results for orders placed or performed in visit on 07/20/22  Microscopic Examination   Urine  Result Value Ref Range   WBC, UA 0-5 0 - 5 /hpf   RBC, Urine 0-2 0 - 2 /hpf   Epithelial Cells (non renal) >10 (A) 0 - 10 /hpf   Bacteria, UA Few None seen/Few  Urinalysis, Complete  Result Value Ref Range   Specific Gravity, UA 1.015 1.005 - 1.030   pH, UA 6.0 5.0 - 7.5   Color, UA Yellow Yellow   Appearance Ur Clear Clear   Leukocytes,UA Negative Negative   Protein,UA Negative Negative/Trace   Glucose, UA 2+ (A) Negative   Ketones, UA Negative Negative   RBC, UA Negative Negative   Bilirubin, UA Negative Negative   Urobilinogen, Ur 1.0  0.2 - 1.0 mg/dL   Nitrite, UA Negative Negative   Microscopic Examination See below:   BLADDER SCAN AMB NON-IMAGING  Result Value Ref Range   Scan Result 1 mL   Assessment & Plan:   1. Dysuria UA bland, PVR WNL.  Low concern for infection at this time.  Reports of frequent UTIs managed in the emergency department are not verified per chart review.  Suspect an element of malingering, however with reports of weak urine stream and his history of urethral stricture, we discussed options including increasing Flomax to 0.8 mg daily versus pursuing cystoscopy to rule out recurrence and he elected for the latter. - Urinalysis, Complete - BLADDER SCAN AMB NON-IMAGING  2. Chronic bilateral low back pain, unspecified whether sciatica present We discussed that back pain exacerbated with movement, lifting, and positional changes is more consistent with musculoskeletal etiology.  I do not think this is related to his urinary symptoms.  Return for Cysto with Dr. Bernardo Heater.  Debroah Loop, PA-C  [facility] [address] [place],  [address] [phone]

## 2022-07-25 NOTE — Telephone Encounter (Signed)
Daniel [name]/ or Triage- can you please let patient know split night sleep study showed severe OSA and severe oxygen desaturation. He had average 143 apneas an hour with spo2 low 63%. Corrected by BIPAP 20/20 with size large full face mask  Please place urgent order for auto bilevel (EPAP minimum 12, pressure support +4, IPAP maximum 24)  Advised he wear BIPAP any time he is sleeping, aim to wear 4-6 hours or more at night. Don't drive if tired. Focus on side sleeping position. Do not drink alcohol or take sedating medication prior to bedtime   Needs visit in 6 weeks

## 2022-07-25 NOTE — Telephone Encounter (Signed)
Attempted to call pt but unable to reach. Left message to return call.  

## 2022-07-27 ENCOUNTER — Telehealth: Payer: Self-pay

## 2022-07-27 NOTE — Telephone Encounter (Signed)
Sleep study received from [facility] and faxed to [facility] for review and signature.

## 2022-07-28 ENCOUNTER — Ambulatory Visit: Payer: Medicaid Other | Attending: Cardiology

## 2022-07-28 ENCOUNTER — Encounter: Payer: Self-pay | Admitting: *Deleted

## 2022-07-28 DIAGNOSIS — I5022 Chronic systolic (congestive) heart failure: Secondary | ICD-10-CM | POA: Insufficient documentation

## 2022-07-28 LAB — PULMONARY FUNCTION TEST ARMC ONLY
DL/VA % pred: 122 %
DL/VA: 5.77 ml/min/mmHg/L
DLCO unc % pred: 92 %
DLCO unc: 36.73 ml/min/mmHg
FEF 25-75 Pre: 5.32 L/sec
FEF2575-%Pred-Pre: 105 %
FEV1-%Pred-Pre: 75 %
FEV1-Pre: 4.02 L
FEV1FVC-%Pred-Pre: 105 %
FEV6-%Pred-Pre: 71 %
FEV6-Pre: 4.68 L
FEV6FVC-%Pred-Pre: 101 %
FVC-%Pred-Pre: 70 %
FVC-Pre: 4.68 L
Pre FEV1/FVC ratio: 86 %
Pre FEV6/FVC Ratio: 100 %
RV % pred: 130 %
RV: 2.67 L
TLC % pred: 83 %
TLC: 6.91 L

## 2022-07-28 NOTE — Telephone Encounter (Signed)
Lm for patient.  

## 2022-07-28 NOTE — Telephone Encounter (Signed)
Theres a telephone note in chart/ Please call patient and place order for BIPAP. Thanks  Split night sleep study on 07/06/22 showed severe OSA and severe oxygen desaturation. He had average 143 apneas an hour with spo2 low 63%. Corrected by BIPAP 20/20 with size large full face mask   Please place urgent order for auto bilevel (EPAP minimum 12, pressure support +4, IPAP maximum 24)   Advised he wear BIPAP any time he is sleeping, aim to wear 4-6 hours or more at night. Don't drive if tired. Focus on side sleeping position. Do not drink alcohol or take sedating medication prior to bedtime    Needs visit in 6 weeks

## 2022-07-28 NOTE — Telephone Encounter (Signed)
Attempted to call pt but unable to reach. Left message for him to return call.  Due to multiple attempts trying to reach pt and unable to do so, letter will be mailed to pt to have him call the office to have results discussed.

## 2022-07-29 NOTE — Telephone Encounter (Signed)
Lm x2 for patient.  Letter mailed to address on file.  Will close encounter per office protocol.

## 2022-08-01 NOTE — Telephone Encounter (Signed)
Patient's care giver, OJ called back. OJ is not listed on DPR. He will have patient's legal guardian, [name] call us to obtain results.

## 2022-08-04 ENCOUNTER — Telehealth (HOSPITAL_COMMUNITY): Payer: Self-pay

## 2022-08-04 NOTE — Telephone Encounter (Signed)
Caregiver given results of PFT study

## 2022-08-05 ENCOUNTER — Emergency Department
Admission: EM | Admit: 2022-08-05 | Discharge: 2022-08-05 | Disposition: A | Discharge status: Home / Self Care | Payer: Medicaid Other | Attending: Emergency Medicine | Admitting: Emergency Medicine

## 2022-08-05 ENCOUNTER — Ambulatory Visit: Payer: Medicaid Other | Attending: Cardiology | Admitting: Cardiology

## 2022-08-05 ENCOUNTER — Emergency Department: Payer: Medicaid Other

## 2022-08-05 ENCOUNTER — Other Ambulatory Visit
Admission: RE | Admit: 2022-08-05 | Discharge: 2022-08-05 | Disposition: A | Discharge status: Home / Self Care | Payer: Medicaid Other | Source: Ambulatory Visit | Attending: Cardiology | Admitting: Cardiology

## 2022-08-05 ENCOUNTER — Other Ambulatory Visit: Payer: Self-pay

## 2022-08-05 ENCOUNTER — Encounter: Payer: Self-pay | Admitting: Cardiology

## 2022-08-05 VITALS — BP 126/66 | HR 81 | Wt 381.2 lb

## 2022-08-05 DIAGNOSIS — Z9981 Dependence on supplemental oxygen: Secondary | ICD-10-CM | POA: Diagnosis not present

## 2022-08-05 DIAGNOSIS — I480 Paroxysmal atrial fibrillation: Secondary | ICD-10-CM | POA: Diagnosis not present

## 2022-08-05 DIAGNOSIS — R0789 Other chest pain: Secondary | ICD-10-CM | POA: Insufficient documentation

## 2022-08-05 DIAGNOSIS — Z79899 Other long term (current) drug therapy: Secondary | ICD-10-CM | POA: Insufficient documentation

## 2022-08-05 DIAGNOSIS — Z87891 Personal history of nicotine dependence: Secondary | ICD-10-CM | POA: Insufficient documentation

## 2022-08-05 DIAGNOSIS — Z7984 Long term (current) use of oral hypoglycemic drugs: Secondary | ICD-10-CM | POA: Insufficient documentation

## 2022-08-05 DIAGNOSIS — Z6841 Body Mass Index (BMI) 40.0 and over, adult: Secondary | ICD-10-CM | POA: Diagnosis not present

## 2022-08-05 DIAGNOSIS — Z7901 Long term (current) use of anticoagulants: Secondary | ICD-10-CM | POA: Insufficient documentation

## 2022-08-05 DIAGNOSIS — M545 Low back pain, unspecified: Secondary | ICD-10-CM

## 2022-08-05 DIAGNOSIS — J9611 Chronic respiratory failure with hypoxia: Secondary | ICD-10-CM | POA: Diagnosis not present

## 2022-08-05 DIAGNOSIS — G4733 Obstructive sleep apnea (adult) (pediatric): Secondary | ICD-10-CM | POA: Insufficient documentation

## 2022-08-05 DIAGNOSIS — E669 Obesity, unspecified: Secondary | ICD-10-CM | POA: Diagnosis not present

## 2022-08-05 DIAGNOSIS — R42 Dizziness and giddiness: Secondary | ICD-10-CM | POA: Diagnosis present

## 2022-08-05 DIAGNOSIS — I110 Hypertensive heart disease with heart failure: Secondary | ICD-10-CM | POA: Diagnosis not present

## 2022-08-05 DIAGNOSIS — I5022 Chronic systolic (congestive) heart failure: Secondary | ICD-10-CM | POA: Insufficient documentation

## 2022-08-05 DIAGNOSIS — Z8616 Personal history of COVID-19: Secondary | ICD-10-CM | POA: Insufficient documentation

## 2022-08-05 DIAGNOSIS — I509 Heart failure, unspecified: Secondary | ICD-10-CM | POA: Insufficient documentation

## 2022-08-05 LAB — URINALYSIS, ROUTINE W REFLEX MICROSCOPIC
Bilirubin Urine: NEGATIVE
Glucose, UA: >=500 mg/dL — AB
Hgb urine dipstick: NEGATIVE
Ketones, ur: NEGATIVE mg/dL
Leukocytes,Ua: NEGATIVE
Nitrite: NEGATIVE
Protein, ur: NEGATIVE mg/dL
Specific Gravity, Urine: 1.007 (ref 1.005–1.030)
pH: 7 (ref 5.0–8.0)

## 2022-08-05 LAB — CBC
HCT: 39.1 % (ref 39.0–52.0)
Hemoglobin: 12.4 g/dL — ABNORMAL LOW (ref 13.0–17.0)
MCH: 27.4 pg (ref 26.0–34.0)
MCHC: 31.7 g/dL (ref 30.0–36.0)
MCV: 86.5 fL (ref 80.0–100.0)
Platelets: 252 10*3/uL (ref 150–400)
RBC: 4.52 MIL/uL (ref 4.22–5.81)
RDW: 13.9 % (ref 11.5–15.5)
WBC: 7.3 10*3/uL (ref 4.0–10.5)
nRBC: 0 % (ref 0.0–0.2)

## 2022-08-05 LAB — TROPONIN I (HIGH SENSITIVITY)
Troponin I (High Sensitivity): 2 ng/L (ref <18)
Troponin I (High Sensitivity): <2 ng/L (ref <18)

## 2022-08-05 LAB — BASIC METABOLIC PANEL
Anion gap: 9 (ref 5–15)
BUN: 15 mg/dL (ref 6–20)
CO2: 28 mmol/L (ref 22–32)
Calcium: 9 mg/dL (ref 8.9–10.3)
Chloride: 98 mmol/L (ref 98–111)
Creatinine, Ser: 0.78 mg/dL (ref 0.61–1.24)
GFR, Estimated: >60 mL/min (ref >60)
Glucose, Bld: 120 mg/dL — ABNORMAL HIGH (ref 70–99)
Potassium: 3.6 mmol/L (ref 3.5–5.1)
Sodium: 135 mmol/L (ref 135–145)

## 2022-08-05 LAB — BRAIN NATRIURETIC PEPTIDE: B Natriuretic Peptide: 319 pg/mL — ABNORMAL HIGH (ref 0.0–100.0)

## 2022-08-05 MED ORDER — METOPROLOL SUCCINATE ER 100 MG PO TB24: 150.0000 mg | ORAL_TABLET | Freq: Every day | ORAL | 3 refills | Status: DC

## 2022-08-05 MED ORDER — KETOROLAC TROMETHAMINE 15 MG/ML IJ SOLN
15.0000 mg | Freq: Once | INTRAMUSCULAR | Status: AC
  Administered 2022-08-05: 15 mg via INTRAVENOUS
  Filled 2022-08-05: qty 1

## 2022-08-05 NOTE — ED Triage Notes (Addendum)
Pt to ED via POV from getting blood drawn. Pt brought up after pt started feeling dizzy during blood draw. Pt also reports SOB that started last night. Pt chronically on 4L [place]. Pt with CHF, DM, HTN and seizures. Pt reports has been med compliant.   Pt resides at [place] and group home director at bedside.

## 2022-08-05 NOTE — Patient Instructions (Addendum)
Routine lab work today. Will notify you of abnormal results  STOP Metoprolol tartrate   START Metoprolol succinate '150mg'$  daily  Your provider requests you have a heart cath ( we will call you with appointment date and instructions)  You will need an echocardiogram before your heart cath (we will call you with appointment date  Follow up up in 2-3 weeks after cath  (we will call you to schedule appointment)  Do the following things EVERYDAY: Weigh yourself in the morning before breakfast. Write it down and keep it in a log. Take your medicines as prescribed Eat low salt foods--Limit salt (sodium) to 2000 mg per day.  Stay as active as you can everyday Limit all fluids for the day to less than 2 liters

## 2022-08-05 NOTE — Discharge Instructions (Addendum)
You were seen in the emergency department for dizziness.  You had a heart enzyme that was normal.  You have no findings of heart failure exacerbation on your chest x-ray.  Your lab work was overall normal.  While you are in the emergency department you developed lower back pain.  Your urine did not show any signs of kidney stones or an infection.  Your x-ray did not show any broken bones.  Follow-up with your primary care physician.  Pain control:  Ibuprofen (motrin/aleve/advil) - You can take 3-4 tablets (600-800 mg) every 6 hours as needed for pain/fever.  Acetaminophen (tylenol) - You can take 2 extra strength tablets (1000 mg) every 6 hours as needed for pain/fever.  You can alternate these medications or take them together.  Make sure you eat food/drink water when taking these medications.

## 2022-08-05 NOTE — ED Provider Notes (Signed)
[facility] Provider Note    Event Date/Time   First MD Initiated Contact with Patient 08/05/22 1213     (approximate)   History   Dizziness   HPI  Daniel [name] is a 32 y.o. male past medical history significant for atrial fibrillation, CHF, hypertension, obesity, 4 L of home oxygen at baseline, who presents to the emergency department for dizziness episode.  Patient was in heart failure clinic and getting his blood drawn when he stated that he felt dizzy.  Felt dizzy and would not allow for his blood work to be drawn so they brought him over to the emergency department.  Patient denies any room spinning dizziness.  Complaining of chest tightness and lower back pain.  States that he has been having lower back pain for the past 1 month.  Does endorse a fall over a month ago.  Has been ambulating on his own.  States that he is having pain to his lower legs bilaterally.  No urinary or bowel incontinence.  No history of DVT or PE.  Patient's caregiver, OJ, at bedside states that the patient is at his baseline.  States that he likes to come to the hospital and likes to get workup.  He has not had any recent falls and believes that he is at his baseline normal status.     Physical Exam   Triage Vital Signs: ED Triage Vitals  Enc Vitals Group     BP 08/05/22 1217 (!) 140/91     Pulse Rate 08/05/22 1217 82     Resp 08/05/22 1217 18     Temp 08/05/22 1217 98.1 F (36.7 C)     Temp Source 08/05/22 1217 Oral     SpO2 08/05/22 1217 94 %     Weight --      Height --      Head Circumference --      Peak Flow --      Pain Score 08/05/22 1216 4     Pain Loc --      Pain Edu? --      Excl. in [place]? --     Most recent vital signs: Vitals:   08/05/22 1217  BP: (!) 140/91  Pulse: 82  Resp: 18  Temp: 98.1 F (36.7 C)  SpO2: 94%    Physical Exam Constitutional:      Appearance: He is well-developed. He is obese.  HENT:     Head: Atraumatic.  Eyes:      Conjunctiva/sclera: Conjunctivae normal.  Cardiovascular:     Rate and Rhythm: Regular rhythm.  Pulmonary:     Effort: No respiratory distress.     Comments: 4 L nasal cannula Abdominal:     General: There is no distension.  Musculoskeletal:        General: Normal range of motion.     Cervical back: Normal range of motion.     Right lower leg: No edema.     Left lower leg: No edema.     Comments: Knee brace on the left lower extremity.  Mild midline lumbar tenderness to palpation.  Normal sensation to bilateral lower extremities.  +2 DP pulses that are equal.  5/5 strength bilateral lower extremities.  No saddle anesthesia.  Skin:    General: Skin is warm.  Neurological:     Mental Status: He is alert. Mental status is at baseline.     IMPRESSION / MDM / ASSESSMENT AND PLAN / ED COURSE  I reviewed the triage vital signs and the nursing notes.  Differential diagnosis including heart failure exacerbation, anemia, ACS, sciatica, lumbar fracture  EKG  I, Nathaniel Man, the attending physician, personally viewed and interpreted this ECG.   Rate: Normal  Rhythm: Normal sinus  Axis: Normal  Intervals: Normal  ST&T Change: None  No tachycardic or bradycardic dysrhythmias while on cardiac telemetry.  RADIOLOGY I independently reviewed imaging, my interpretation of imaging: Chest x-ray no focal findings consistent with pulmonary edema.  Read as no acute findings.  LABS (all labs ordered are listed, but only abnormal results are displayed) Labs interpreted as -    Labs Reviewed  BASIC METABOLIC PANEL - Abnormal; Notable for the following components:      Result Value   Glucose, Bld 120 (*)    All other components within normal limits  CBC - Abnormal; Notable for the following components:   Hemoglobin 12.4 (*)    All other components within normal limits  URINALYSIS, ROUTINE W REFLEX MICROSCOPIC - Abnormal; Notable for the following components:   Color, Urine STRAW (*)     APPearance CLEAR (*)    Glucose, UA >=500 (*)    Bacteria, UA RARE (*)    All other components within normal limits  BRAIN NATRIURETIC PEPTIDE - Abnormal; Notable for the following components:   B Natriuretic Peptide 319.0 (*)    All other components within normal limits  CBG MONITORING, ED  TROPONIN I (HIGH SENSITIVITY)  TROPONIN I (HIGH SENSITIVITY)    TREATMENT  Ketorolac  MDM  Troponin negative, clinical picture is not consistent with ACS.  No signs of pulmonary edema.  No significant anemia.  Creatinine at baseline with no significant electrolyte abnormalities.  Patient then complaining of lower back pain.  His group home primary person at bedside states that this is his normal and that he often tries to get secondary gain while being in the emergency department.  Discussed with the patient that we would get an x-ray of his lumbar spine and then he would discharge home with Motrin and Tylenol.  No concern for cauda equina or epidural compression syndrome.  After telling the patient that he would be discharged home stated that he was feeling sad and having thoughts of suicidal ideation.  On chart review patient has done this multiple times in the past when being discharged.  Believe this is for secondary gain.  Patient does not have a plan.  Patient's group home provider, O.J. at bedside states that he felt very safe with him going back to the group home and did not feel that we needed a psychiatric consultation.  Patient be discharged home if his x-rays are negative.     PROCEDURES:  Critical Care performed: No  Procedures  Patient's presentation is most consistent with acute presentation with potential threat to life or bodily function.   MEDICATIONS ORDERED IN ED: Medications  ketorolac (TORADOL) 15 MG/ML injection 15 mg (15 mg Intravenous Given 08/05/22 1359)    FINAL CLINICAL IMPRESSION(S) / ED DIAGNOSES   Final diagnoses:  Dizziness  Acute midline low back pain  without sciatica     Rx / DC Orders   ED Discharge Orders     None        Note:  This document was prepared using Dragon voice recognition software and may include unintentional dictation errors.   Nathaniel Man, MD 08/05/22 1553

## 2022-08-06 LAB — HEMOGLOBIN A1C
Hgb A1c MFr Bld: 5.8 % — ABNORMAL HIGH (ref 4.8–5.6)
Mean Plasma Glucose: 120 mg/dL

## 2022-08-07 NOTE — Progress Notes (Signed)
PCP: Lauretta Grill, NP Cardiology: Dr. Saunders Revel HF Cardiology: Dr. Aundra Dubin  32 y.o. with history of paroxysmal atrial fibrillation, HF with mid range EF, schizoaffective disorder, and chronic hypoxemic respiratory failure on home oxygen presents for evaluation of CHF.  Patient has had a long history of paroxysmal AF.  He says this was diagnosed when he was 20 and he was told that it was triggered by excessive use of energy drinks.  He never abused drugs or ETOH per his report though he was a smoker until 5/23.  He was followed in [place] in the past.  There was talk of atrial fibrillation ablation but it was never undertaken.  Subsequently, he moved to a group home in [place].  He has been followed by [facility].  He was hospitalized in early 2023 with atrial fibrillation and CHF.  Echo in 3/23 showed EF 50% and echo in 5/23 showed EF 45-50%, both instances it appeared he was in atrial fibrillation.  He has been in NSR in this office, however.     Patient has had home oxygen x 2 years per his report.  High resolution CT chest in 2/24 showed no ILD, possible small airways disease.  PFTs in 2/24 showed minimal obstructive airways disease.  Sleep study showed severe OSA.   Patient is in NSR today. He has not been using his home oxygen as he says that his oxygen saturation is in the 90s at rest now.  Weight is stable. He is short of breath when he walks long distances and also has somewhat random episodes of dyspnea.  He has atypical chest pain at times (pleuritic).  No palpitations.    ECG (personally reviewed): NSR, LVH  Labs (12/23): K 4, creatinine 0.78, BNP 11 Labs (1/24): BNP 85 Labs (2/24): K 3.9, creatinine 1.07  PMH: 1. Gout 2. Atrial fibrillation: Paroxysmal.  First noted at age 41 per his report.  Grandmother and grandfather had AF but at advanced age.  3. HTN 4. OSA: severe on sleep study 5. Chronic hypoxemic respiratory failure: Patient states he has been using oxygen since 2022.   - Prior smoker, quit 4/23.  - High resolution CT chest (2/24): no ILD, possible small airways disease - PFTs (2/24): minimal obstructive airways disease.  6. H/o ?TIA 7. H/o urethral stricture 8. Schizoaffective disorder: Lives in group home.  9. H/o seizure disorder.  10. H/o COVID-19 x 3 11. Hyperlipidemia 12. HF with mid range EF: Echo (3/23) with EF 50%, suspect was in AF.   - Echo (5/23): EF 45-50%, RV normal IVC normal.  In setting of AF.  4. Baker's cysts  SH: Lives in group home in [place].  Quit smoking 4/23.  No drugs or ETOH.   FH: Grandfather with MIs, grandmother with AF at advanced age, other grandfather with AF at advanced age.   ROS: All systems reviewed and negative except as per HPI.   Current Outpatient Medications  Medication Sig Dispense Refill   allopurinol (ZYLOPRIM) 300 MG tablet Take 1 tablet (300 mg total) by mouth daily. 30 tablet 0   atorvastatin (LIPITOR) 80 MG tablet Take 80 mg by mouth daily.     baclofen (LIORESAL) 10 MG tablet Take 1 tablet (10 mg total) by mouth 2 (two) times daily as needed for muscle spasms. Home med. 30 each 0   benztropine (COGENTIN) 1 MG tablet Take 1 mg by mouth daily.     cyclobenzaprine (FLEXERIL) 5 MG tablet Take 5 mg by mouth 3 (three)  times daily as needed for muscle spasms.     diclofenac Sodium (VOLTAREN) 1 % GEL Apply 2 g topically 4 (four) times daily. 100 g 0   diltiazem (CARDIZEM CD) 360 MG 24 hr capsule Take 1 capsule (360 mg total) by mouth daily. 30 capsule 1   divalproex (DEPAKOTE) 500 MG DR tablet Take 2 tablets (1,000 mg total) by mouth 2 (two) times daily. (Patient taking differently: Take 1,000 mg by mouth daily at 6 (six) AM.)     ELIQUIS 5 MG TABS tablet Take 5 mg by mouth 2 (two) times daily.     escitalopram (LEXAPRO) 10 MG tablet Take 20 mg by mouth daily.     FARXIGA 10 MG TABS tablet TAKE 1 TABLET BY MOUTH ONCE DAILY BEFORE BREAKFAST 28 tablet 10   furosemide (LASIX) 20 MG tablet Take 1 tablet (20  mg total) by mouth daily. 30 tablet 0   hydrOXYzine (ATARAX) 25 MG tablet Take 25 mg by mouth daily. At noon     hydrOXYzine (ATARAX) 50 MG tablet Take 50 mg by mouth in the morning and at bedtime.     INVEGA 9 MG 24 hr tablet Take 9 mg by mouth every morning.     lithium carbonate (LITHOBID) 300 MG CR tablet Take 600 mg by mouth every evening.     loratadine (CLARITIN) 10 MG tablet Take 10 mg by mouth daily.     melatonin 3 MG TABS tablet Take 3 mg by mouth at bedtime.     metoprolol succinate (TOPROL XL) 100 MG 24 hr tablet Take 1.5 tablets (150 mg total) by mouth daily. Take with or immediately following a meal. 45 tablet 3   omeprazole (PRILOSEC) 20 MG capsule Take 20 mg by mouth daily.     sacubitril-valsartan (ENTRESTO) 24-26 MG Take 1 tablet by mouth 2 (two) times daily. 60 tablet 5   tamsulosin (FLOMAX) 0.4 MG CAPS capsule Take 1 capsule (0.4 mg total) by mouth daily. 30 capsule 2   acetaminophen (TYLENOL) 650 MG suppository Place 1,300 mg rectally every 4 (four) hours as needed.     albuterol (VENTOLIN HFA) 108 (90 Base) MCG/ACT inhaler Inhale 2 puffs into the lungs every 6 (six) hours as needed for wheezing. 1 each 2   colchicine 0.6 MG tablet Take 1 tablet (0.6 mg total) by mouth daily for 3 days. 3 tablet 0   fluticasone (FLONASE) 50 MCG/ACT nasal spray Place 2 sprays into both nostrils 2 (two) times daily.     guaiFENesin (MUCINEX) 600 MG 12 hr tablet Take 1 tablet (600 mg total) by mouth 2 (two) times daily as needed. 60 tablet 1   ibuprofen (ADVIL) 800 MG tablet Take 800 mg by mouth every 8 (eight) hours as needed.     naproxen (NAPROSYN) 500 MG tablet Take 1 tablet (500 mg total) by mouth 2 (two) times daily with a meal. 20 tablet 2   traMADol (ULTRAM) 50 MG tablet Take 50 mg by mouth every 6 (six) hours as needed.     No current facility-administered medications for this visit.   BP 126/66   Pulse 81   Wt (!) 381 lb 3.2 oz (172.9 kg)   SpO2 95%   BMI 45.20 kg/m  General:  NAD, obese.  Neck: No JVD, no thyromegaly or thyroid nodule.  Lungs: Clear to auscultation bilaterally with normal respiratory effort. CV: Nondisplaced PMI.  Heart regular S1/S2, no S3/S4, no murmur.  No peripheral edema.  No carotid bruit.  Normal pedal pulses.  Abdomen: Soft, nontender, no hepatosplenomegaly, no distention.  Skin: Intact without lesions or rashes.  Neurologic: Alert and oriented x 3.  Psych: Normal affect. Extremities: No clubbing or cyanosis.  HEENT: Normal.   Assessment/Plan: 1. Atrial fibrillation: Paroxysmal.  Onset at very early age (24 per his report).  He has a family history of AF but others were of advanced age when they had AF.  ?Related to OHS/OSA and obesity.  Also used to abuse energy drinks per his report.  Recently, he seems to be staying in NSR.  - Continue Apixaban.  - I will stop diltiazem CD if EF is low on repeat echo.  - Stop metoprolol tartrate, start Toprol XL 150 mg daily.  - Needs weight loss and treatment of sleep apnea, see below.  - Would pursue atrial fibrillation ablation if he has recurrence of atrial fibrillation once he is on CPAP and has lost additional weight.  2. HF with mid range EF: Echo in 3/23 showed EF 50% and echo in 5/23 showed EF 45-50%, both instances it appeared he was in atrial fibrillation.  He is not volume overloaded on exam today.  - Repeat echo in sinus rhythm, ordered.  - Continue Farxiga 10 mg daily.  - Continue Lasix 20 mg daily. BMET today.  - Continue Entresto 24/26 bid.  - Transitioning from metoprolol tartrate to Toprol XL as above.  - I will arrange for RHC to assess filling pressures given ongoing dyspneic episodes.  If repeat echo in NSR shows low EF, I will also do coronary angiography.  We discussed risks/benefits of cath and he agrees.   3. OSA: Severe OSA on sleep study.   - Awaiting CPAP.   4. Obesity: He has not had diabetes so has not been able to get GLP-1 agonist.  - Will recheck hgbA1c today.  5.  Chronic hypoxemic respiratory failure: He has been on home oxygen for about 2 years per his report though not using recently as oxygen saturation has been higher.  Etiology is uncertain.  Possible OHS/OSA as HRCT in 2/24 showed no ILD or significant emphysema and PFTs in 2/24 showed only minimal obstructive airways disease. He has severe OSA.   - Referred for pulmonary rehab.   Followup with me after cath   Loralie Champagne 08/07/2022

## 2022-08-09 ENCOUNTER — Telehealth: Payer: Self-pay | Admitting: *Deleted

## 2022-08-09 NOTE — Telephone Encounter (Signed)
Attempted to reach pt multiple times by phone to schedule echo. Pts needs echo before heart cath. Dr.McLean to determine if right or right and left heart cath is needed based off echo.

## 2022-08-12 ENCOUNTER — Telehealth (HOSPITAL_COMMUNITY): Payer: Self-pay

## 2022-08-12 NOTE — Telephone Encounter (Signed)
He returned call to get Echo scheduled.

## 2022-08-15 IMAGING — CT CT HEAD CODE STROKE
4 series · 16 of 47 positions shown, 18 images · non-contrast
Comparison: 10/16/2021

CLINICAL DATA: Code stroke. Atrial fibrillation, shortness of
breath and lightheadedness



[Series 3: head bone · axial · 0.42mm/px · z∈[-148,-120]mm · 3 of 72 slices shown]
[im 8/72  bone]
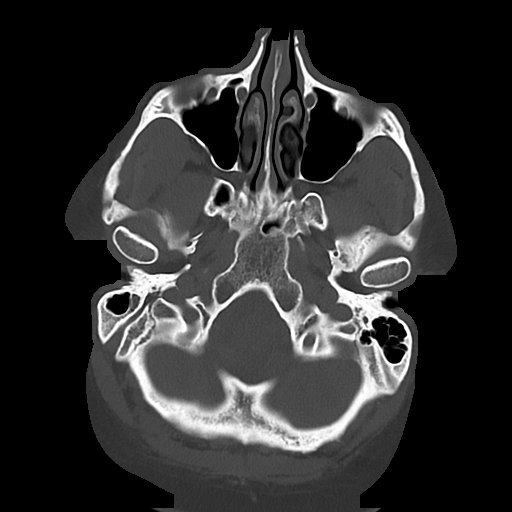
[im 15/72  bone]
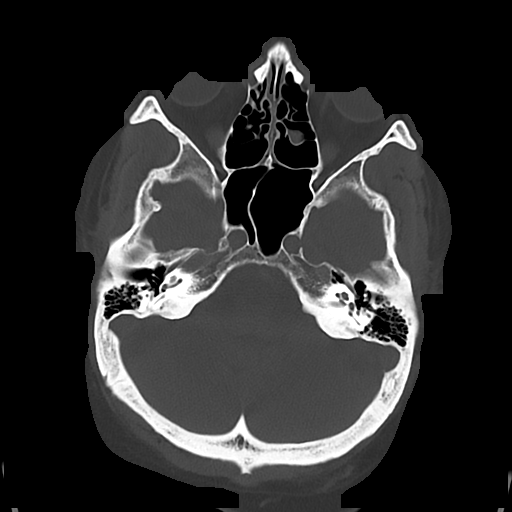
[im 22/72  bone]
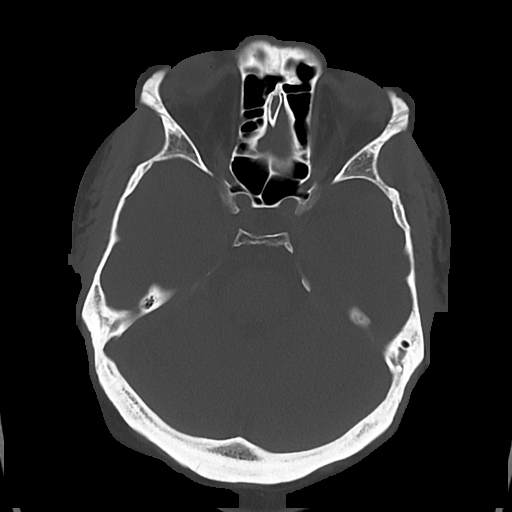

[Series 4: head wo · axial · 0.42mm/px · z∈[-146,-42]mm · 7 of 29 slices shown, 9 images]
[im 4/29  brain]
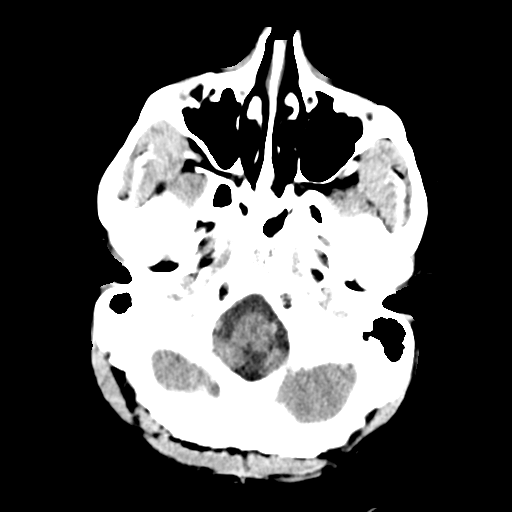
[im 4/29  bone]
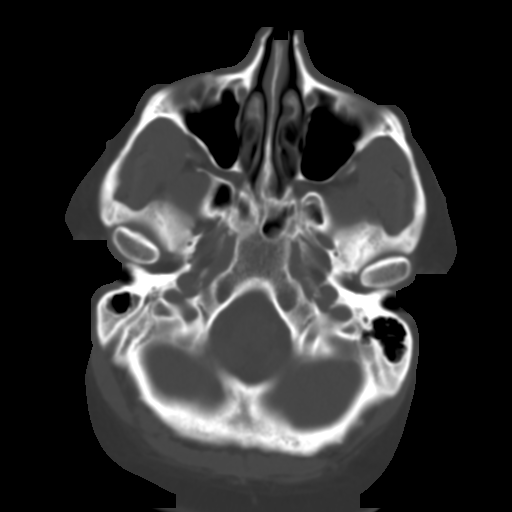
[im 8/29  brain]
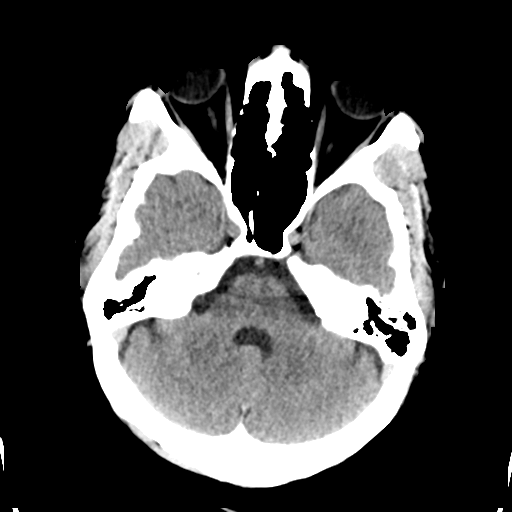
[im 11/29  brain]
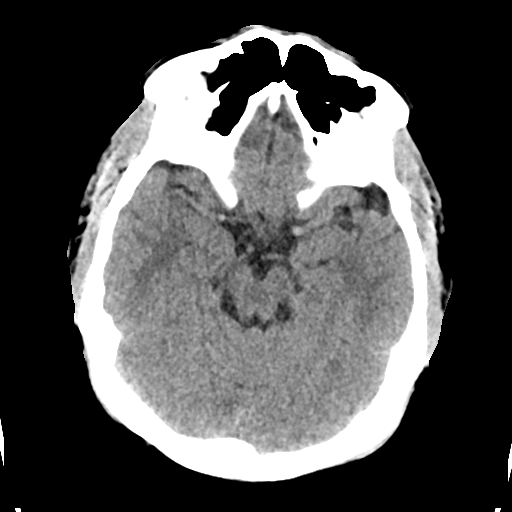
[im 15/29  brain]
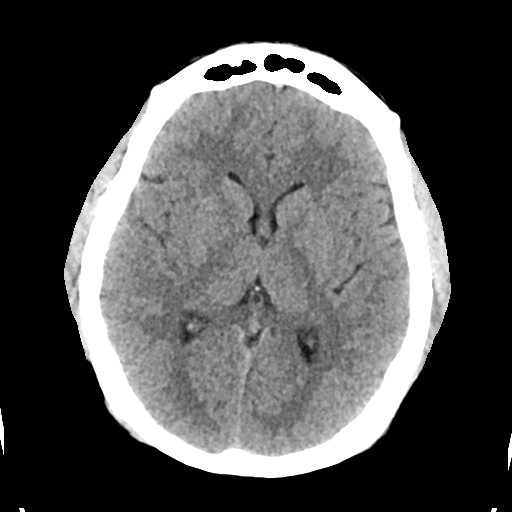
[im 18/29  brain]
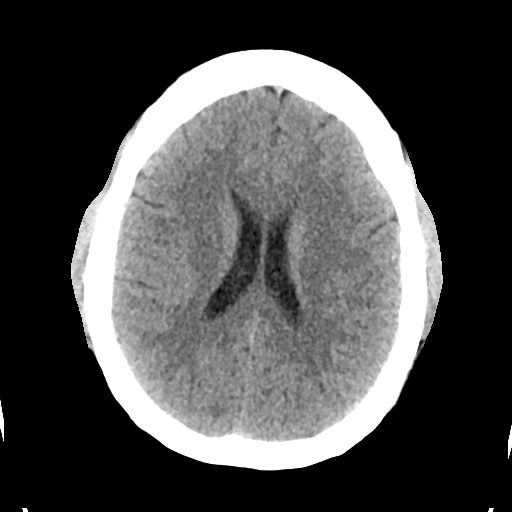
[im 18/29  bone]
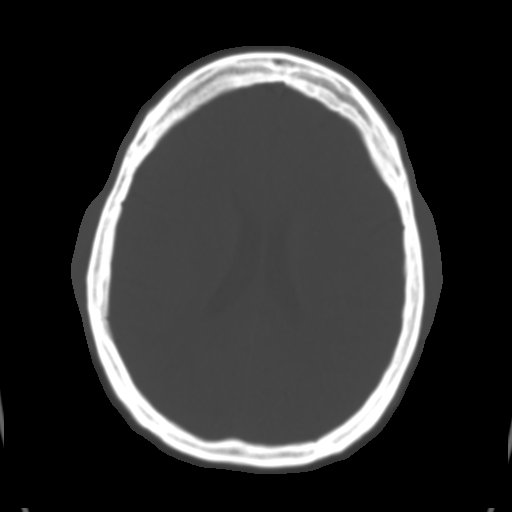
[im 22/29  brain]
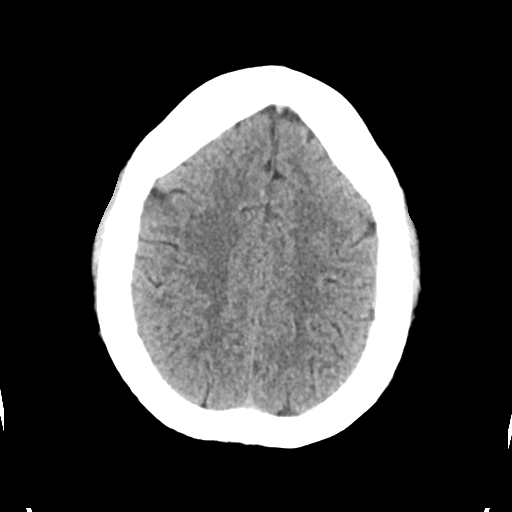
[im 25/29  brain]
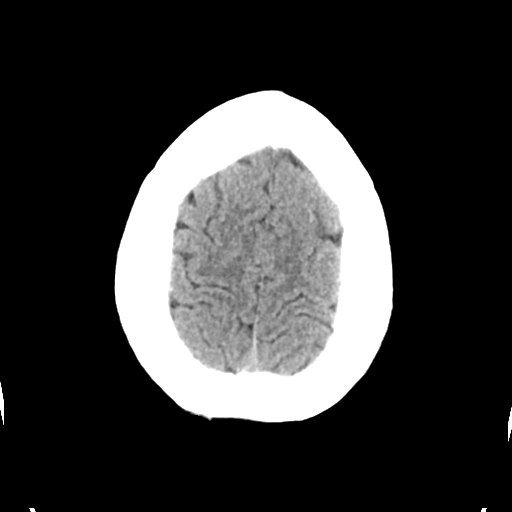

[Series 5: cor soft · coronal · 0.32mm/px · 3 of 75 slices shown]
[im 25/75  brain]
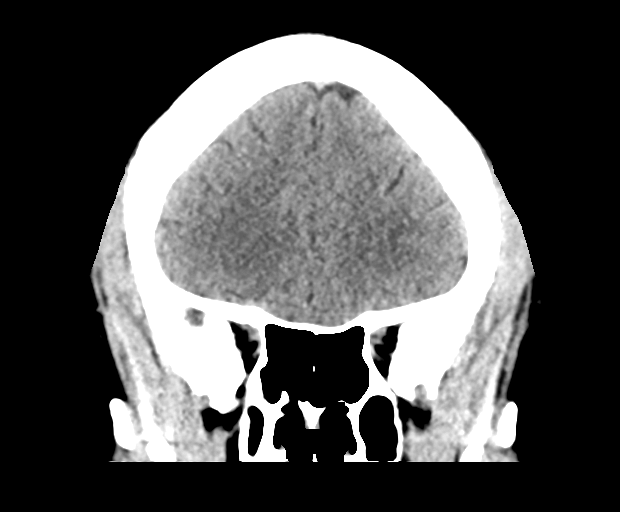
[im 33/75  brain]
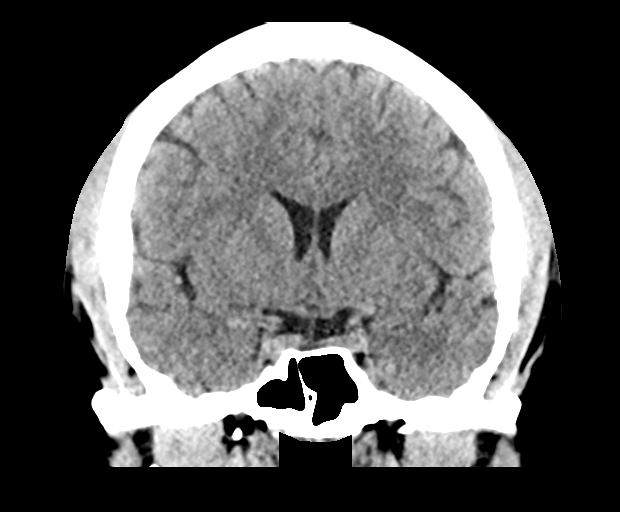
[im 42/75  brain]
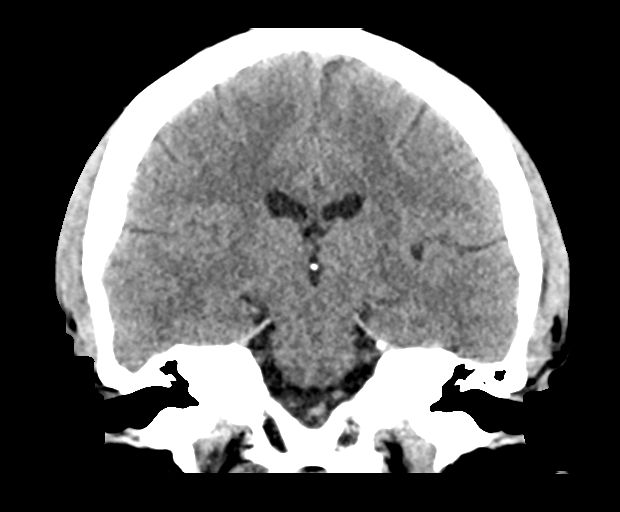

[Series 6: sag soft · sagittal · 0.36mm/px · 3 of 67 slices shown]
[im 23/67  brain]
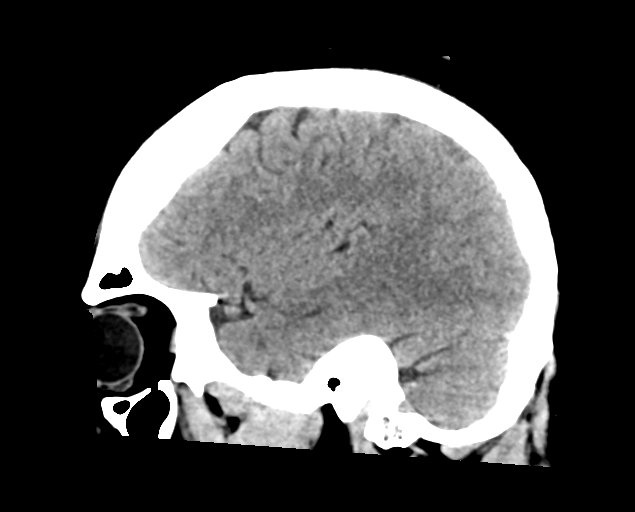
[im 34/67  brain]
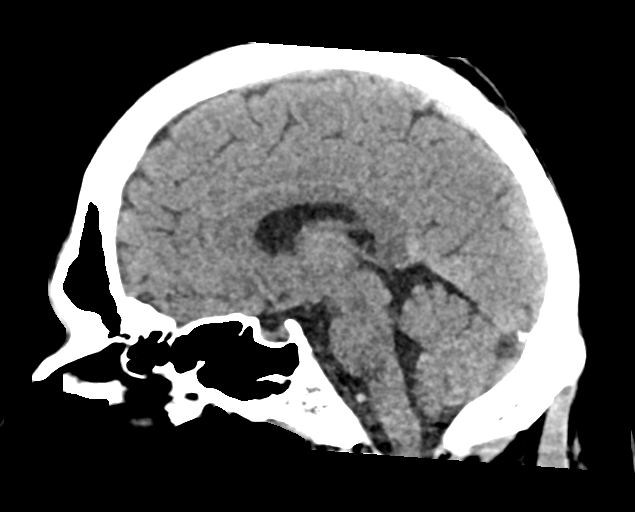
[im 45/67  brain]
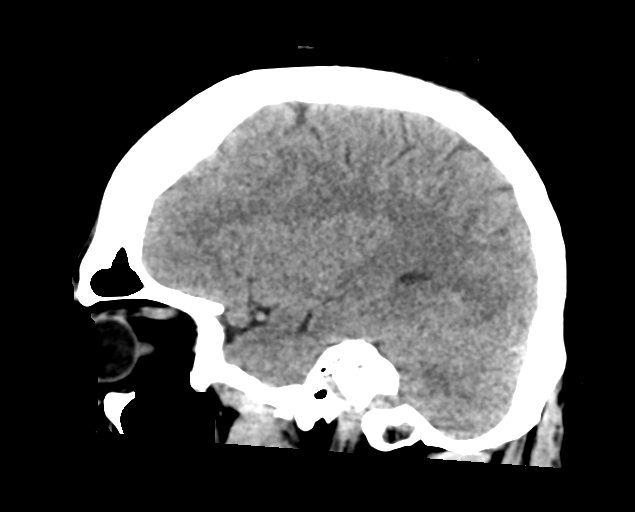

[16 of 47 positions shown; findings below may reference images not displayed]

FINDINGS: Brain: No evidence of acute infarction, hemorrhage, cerebral edema,
mass, mass effect, or midline shift. Ventricles and sulci are normal
for age. No extra-axial fluid collection.

Vascular: No hyperdense vessel.

Skull: Negative for fracture or focal lesion.

Sinuses/Orbits: No acute finding.

Other: The mastoid air cells are well aerated.

ASPECTS (Alberta Stroke Program Early CT Score)

- Ganglionic level infarction (caudate, lentiform nuclei, internal
capsule, insula, M1-M3 cortex): 7

- Supraganglionic infarction (M4-M6 cortex): 3

Total score (0-10 with 10 being normal): 10
IMPRESSION: 1. No acute intracranial process.
2. ASPECTS is 10

Code stroke imaging results were communicated on 11/04/2021 at [DATE]
to provider WILCHEZ via telephone, who verbally acknowledged these
results.

## 2022-08-15 NOTE — Telephone Encounter (Signed)
Called caregiver. No answer/left vm for return call

## 2022-08-18 ENCOUNTER — Ambulatory Visit (INDEPENDENT_AMBULATORY_CARE_PROVIDER_SITE_OTHER): Payer: Medicaid Other | Admitting: Urology

## 2022-08-18 ENCOUNTER — Other Ambulatory Visit: Payer: Self-pay | Admitting: Urology

## 2022-08-18 ENCOUNTER — Encounter: Payer: Self-pay | Admitting: Urology

## 2022-08-18 VITALS — BP 136/74 | HR 68 | Ht 70.0 in | Wt 375.0 lb

## 2022-08-18 DIAGNOSIS — N35812 Other urethral bulbous stricture, male: Secondary | ICD-10-CM

## 2022-08-18 LAB — URINALYSIS, COMPLETE
Bilirubin, UA: NEGATIVE
Ketones, UA: NEGATIVE
Leukocytes,UA: NEGATIVE
Nitrite, UA: NEGATIVE
RBC, UA: NEGATIVE
Specific Gravity, UA: 1.02 (ref 1.005–1.030)
Urobilinogen, Ur: 1 mg/dL (ref 0.2–1.0)
pH, UA: 7 (ref 5.0–7.5)

## 2022-08-18 LAB — MICROSCOPIC EXAMINATION: Epithelial Cells (non renal): >10 /hpf — AB (ref 0–10)

## 2022-08-18 NOTE — H&P (View-Only) (Signed)
 08/18/2022 8:11 PM   Daniel [name] 03/21/1991 [id number]  Referring provider: Hatchett, Mary, NP [address] [address] [place],  [place] [address]  Chief Complaint  Patient presents with   Cysto    HPI: 32 y.o. male with dysuria and obstuctive voiding sxs found to have a recurrent bulbar urethral stricture on urethroscopy. Scheduled for cysto with balloon dilation of urethral stricture.   PMH: Past Medical History:  Diagnosis Date   Atrial fibrillation (HCC)    Cardiomyopathy (HCC)    a. 07/2021 Echo: EF 50%, mild LVH, nl RV size/function. No significant valvular disease.   Chest pain    CHF (congestive heart failure) (HCC)    Conversion disorder    Current use of long term anticoagulation    Diabetes mellitus without complication (HCC)    Hyperlipidemia    Hypertension    OSA (obstructive sleep apnea)    Persistent atrial fibrillation and flutter (HCC)    a. CHA2DS2VASc = 3-4   Schizoaffective disorder (HCC)    Seizure disorder (HCC)    TIA (transient ischemic attack)    Urethral stricture    a. 08/2021 s/p cystoscopy and urethral dilation.    Surgical History: Past Surgical History:  Procedure Laterality Date   CYSTOSCOPY WITH URETHRAL DILATATION N/A 09/07/2021   Procedure: CYSTOSCOPY WITH URETHRAL DILATATION CATHETER PLACEMENT;  Surgeon: Gregoire Bennis C, MD;  Location: [facility] ORS;  Service: Urology;  Laterality: N/A;    Home Medications:  Allergies as of 08/18/2022       Reactions   Haldol [haloperidol] Other (See Comments)   SI   Dermatitis Antigen Rash   Meperidine    Meperidine   Abilify [aripiprazole] Palpitations   Demerol [meperidine Hcl] Hives   Tape Rash   Other reaction(s): Other (See Comments) Itching, skin tear        Medication List        Accurate as of August 18, 2022  8:11 PM. If you have any questions, ask your nurse or doctor.          acetaminophen 650 MG suppository Commonly known as: TYLENOL Place 1,300 mg rectally every 4 (four)  hours as needed.   albuterol 108 (90 Base) MCG/ACT inhaler Commonly known as: VENTOLIN HFA Inhale 2 puffs into the lungs every 6 (six) hours as needed for wheezing.   allopurinol 300 MG tablet Commonly known as: ZYLOPRIM Take 1 tablet (300 mg total) by mouth daily.   atorvastatin 80 MG tablet Commonly known as: LIPITOR Take 80 mg by mouth daily.   baclofen 10 MG tablet Commonly known as: LIORESAL Take 1 tablet (10 mg total) by mouth 2 (two) times daily as needed for muscle spasms. Home med.   benztropine 1 MG tablet Commonly known as: COGENTIN Take 1 mg by mouth daily.   colchicine 0.6 MG tablet Take 1 tablet (0.6 mg total) by mouth daily for 3 days.   cyclobenzaprine 5 MG tablet Commonly known as: FLEXERIL Take 5 mg by mouth 3 (three) times daily as needed for muscle spasms.   diclofenac Sodium 1 % Gel Commonly known as: VOLTAREN Apply 2 g topically 4 (four) times daily.   diltiazem 360 MG 24 hr capsule Commonly known as: CARDIZEM CD Take 1 capsule (360 mg total) by mouth daily.   divalproex 500 MG DR tablet Commonly known as: DEPAKOTE Take 2 tablets (1,000 mg total) by mouth 2 (two) times daily. What changed: when to take this   Eliquis 5 MG Tabs tablet Generic drug: apixaban   Take 5 mg by mouth 2 (two) times daily.   Entresto 24-26 MG Generic drug: sacubitril-valsartan Take 1 tablet by mouth 2 (two) times daily.   escitalopram 10 MG tablet Commonly known as: LEXAPRO Take 20 mg by mouth daily.   Farxiga 10 MG Tabs tablet Generic drug: dapagliflozin propanediol TAKE 1 TABLET BY MOUTH ONCE DAILY BEFORE BREAKFAST   fluticasone 50 MCG/ACT nasal spray Commonly known as: FLONASE Place 2 sprays into both nostrils 2 (two) times daily.   furosemide 20 MG tablet Commonly known as: LASIX Take 1 tablet (20 mg total) by mouth daily.   guaiFENesin 600 MG 12 hr tablet Commonly known as: MUCINEX Take 1 tablet (600 mg total) by mouth 2 (two) times daily as  needed.   hydrOXYzine 50 MG tablet Commonly known as: ATARAX Take 50 mg by mouth in the morning and at bedtime.   hydrOXYzine 25 MG tablet Commonly known as: ATARAX Take 25 mg by mouth daily. At noon   ibuprofen 800 MG tablet Commonly known as: ADVIL Take 800 mg by mouth every 8 (eight) hours as needed.   Invega 9 MG 24 hr tablet Generic drug: paliperidone Take 9 mg by mouth every morning.   lithium carbonate 300 MG ER tablet Commonly known as: LITHOBID Take 600 mg by mouth every evening.   loratadine 10 MG tablet Commonly known as: CLARITIN Take 10 mg by mouth daily.   melatonin 3 MG Tabs tablet Take 3 mg by mouth at bedtime.   metoprolol succinate 100 MG 24 hr tablet Commonly known as: Toprol XL Take 1.5 tablets (150 mg total) by mouth daily. Take with or immediately following a meal.   naproxen 500 MG tablet Commonly known as: Naprosyn Take 1 tablet (500 mg total) by mouth 2 (two) times daily with a meal.   omeprazole 20 MG capsule Commonly known as: PRILOSEC Take 20 mg by mouth daily.   tamsulosin 0.4 MG Caps capsule Commonly known as: FLOMAX Take 1 capsule (0.4 mg total) by mouth daily.   traMADol 50 MG tablet Commonly known as: ULTRAM Take 50 mg by mouth every 6 (six) hours as needed.        Allergies:  Allergies  Allergen Reactions   Haldol [Haloperidol] Other (See Comments)    SI   Dermatitis Antigen Rash   Meperidine     Meperidine   Abilify [Aripiprazole] Palpitations   Demerol [Meperidine Hcl] Hives   Tape Rash    Other reaction(s): Other (See Comments) Itching, skin tear    Family History: No family history on file.  Social History:  reports that he quit smoking about a year ago. His smoking use included cigarettes. He has never used smokeless tobacco. He reports that he does not currently use alcohol. He reports that he does not use drugs.   Physical Exam: BP 136/74   Pulse 68   Ht 5' 10" (1.778 m)   Wt (!) 375 lb (170.1 kg)    BMI 53.81 kg/m   Constitutional:  Alert and oriented, No acute distress. HEENT: [place] AT, moist mucus membranes.  Trachea midline, no masses. Cardiovascular: No clubbing, cyanosis, or edema. Respiratory: Normal respiratory effort, no increased work of breathing. GI: Abdomen is soft, nontender, nondistended, no abdominal masses GU: No CVA tenderness Skin: No rashes, bruises or suspicious lesions. Neurologic: Grossly intact, no focal deficits, moving all 4 extremities. Psychiatric: Normal mood and affect.    Assessment & Plan:    1. Other stricture of bulbous urethra in   male Recurrent bulbar urethral stricture-symptomatic Discussed balloon dilation with Optilume which decreases the chances of stricture recurrence He desires to schedule.  Is on Eliquis and would prefer to hold.  Also on O2 and will need medical clearance The procedure was discussed including potential risks of bleeding, infection/sepsis and recurrent stricture.   Rahiem Schellinger C Radiance Deady, MD  [facility] [address], [address] [place], [place] [address] [phone]  

## 2022-08-18 NOTE — Progress Notes (Signed)
08/18/2022 8:11 PM   Daniel [name] 1990-07-05 [id number]  Referring provider: Lauretta Grill, NP [place],  [place] [address]  Chief Complaint  Patient presents with   Cysto    HPI: 32 y.o. male with dysuria and obstuctive voiding sxs found to have a recurrent bulbar urethral stricture on urethroscopy. Scheduled for cysto with balloon dilation of urethral stricture.   PMH: Past Medical History:  Diagnosis Date   Atrial fibrillation ([place])    Cardiomyopathy ([place])    a. 07/2021 Echo: EF 50%, mild LVH, nl RV size/function. No significant valvular disease.   Chest pain    CHF (congestive heart failure) (HCC)    Conversion disorder    Current use of long term anticoagulation    Diabetes mellitus without complication (HCC)    Hyperlipidemia    Hypertension    OSA (obstructive sleep apnea)    Persistent atrial fibrillation and flutter ([place])    a. CHA2DS2VASc = 3-4   Schizoaffective disorder (HCC)    Seizure disorder (HCC)    TIA (transient ischemic attack)    Urethral stricture    a. 08/2021 s/p cystoscopy and urethral dilation.    Surgical History: Past Surgical History:  Procedure Laterality Date   CYSTOSCOPY WITH URETHRAL DILATATION N/A 09/07/2021   Procedure: CYSTOSCOPY WITH URETHRAL DILATATION CATHETER PLACEMENT;  Surgeon: Abbie Sons, MD;  Location: [facility] ORS;  Service: Urology;  Laterality: N/A;    Home Medications:  Allergies as of 08/18/2022       Reactions   Haldol [haloperidol] Other (See Comments)   SI   Dermatitis Antigen Rash   Meperidine    Meperidine   Abilify [aripiprazole] Palpitations   Demerol [meperidine Hcl] Hives   Tape Rash   Other reaction(s): Other (See Comments) Itching, skin tear        Medication List        Accurate as of August 18, 2022  8:11 PM. If you have any questions, ask your nurse or doctor.          acetaminophen 650 MG suppository Commonly known as: TYLENOL Place 1,300 mg rectally every 4 (four)  hours as needed.   albuterol 108 (90 Base) MCG/ACT inhaler Commonly known as: VENTOLIN HFA Inhale 2 puffs into the lungs every 6 (six) hours as needed for wheezing.   allopurinol 300 MG tablet Commonly known as: ZYLOPRIM Take 1 tablet (300 mg total) by mouth daily.   atorvastatin 80 MG tablet Commonly known as: LIPITOR Take 80 mg by mouth daily.   baclofen 10 MG tablet Commonly known as: LIORESAL Take 1 tablet (10 mg total) by mouth 2 (two) times daily as needed for muscle spasms. Home med.   benztropine 1 MG tablet Commonly known as: COGENTIN Take 1 mg by mouth daily.   colchicine 0.6 MG tablet Take 1 tablet (0.6 mg total) by mouth daily for 3 days.   cyclobenzaprine 5 MG tablet Commonly known as: FLEXERIL Take 5 mg by mouth 3 (three) times daily as needed for muscle spasms.   diclofenac Sodium 1 % Gel Commonly known as: VOLTAREN Apply 2 g topically 4 (four) times daily.   diltiazem 360 MG 24 hr capsule Commonly known as: CARDIZEM CD Take 1 capsule (360 mg total) by mouth daily.   divalproex 500 MG DR tablet Commonly known as: DEPAKOTE Take 2 tablets (1,000 mg total) by mouth 2 (two) times daily. What changed: when to take this   Eliquis 5 MG Tabs tablet Generic drug: apixaban  Take 5 mg by mouth 2 (two) times daily.   Entresto 24-26 MG Generic drug: sacubitril-valsartan Take 1 tablet by mouth 2 (two) times daily.   escitalopram 10 MG tablet Commonly known as: LEXAPRO Take 20 mg by mouth daily.   Farxiga 10 MG Tabs tablet Generic drug: dapagliflozin propanediol TAKE 1 TABLET BY MOUTH ONCE DAILY BEFORE BREAKFAST   fluticasone 50 MCG/ACT nasal spray Commonly known as: FLONASE Place 2 sprays into both nostrils 2 (two) times daily.   furosemide 20 MG tablet Commonly known as: LASIX Take 1 tablet (20 mg total) by mouth daily.   guaiFENesin 600 MG 12 hr tablet Commonly known as: MUCINEX Take 1 tablet (600 mg total) by mouth 2 (two) times daily as  needed.   hydrOXYzine 50 MG tablet Commonly known as: ATARAX Take 50 mg by mouth in the morning and at bedtime.   hydrOXYzine 25 MG tablet Commonly known as: ATARAX Take 25 mg by mouth daily. At noon   ibuprofen 800 MG tablet Commonly known as: ADVIL Take 800 mg by mouth every 8 (eight) hours as needed.   Invega 9 MG 24 hr tablet Generic drug: paliperidone Take 9 mg by mouth every morning.   lithium carbonate 300 MG ER tablet Commonly known as: LITHOBID Take 600 mg by mouth every evening.   loratadine 10 MG tablet Commonly known as: CLARITIN Take 10 mg by mouth daily.   melatonin 3 MG Tabs tablet Take 3 mg by mouth at bedtime.   metoprolol succinate 100 MG 24 hr tablet Commonly known as: Toprol XL Take 1.5 tablets (150 mg total) by mouth daily. Take with or immediately following a meal.   naproxen 500 MG tablet Commonly known as: Naprosyn Take 1 tablet (500 mg total) by mouth 2 (two) times daily with a meal.   omeprazole 20 MG capsule Commonly known as: PRILOSEC Take 20 mg by mouth daily.   tamsulosin 0.4 MG Caps capsule Commonly known as: FLOMAX Take 1 capsule (0.4 mg total) by mouth daily.   traMADol 50 MG tablet Commonly known as: ULTRAM Take 50 mg by mouth every 6 (six) hours as needed.        Allergies:  Allergies  Allergen Reactions   Haldol [Haloperidol] Other (See Comments)    SI   Dermatitis Antigen Rash   Meperidine     Meperidine   Abilify [Aripiprazole] Palpitations   Demerol [Meperidine Hcl] Hives   Tape Rash    Other reaction(s): Other (See Comments) Itching, skin tear    Family History: No family history on file.  Social History:  reports that he quit smoking about a year ago. His smoking use included cigarettes. He has never used smokeless tobacco. He reports that he does not currently use alcohol. He reports that he does not use drugs.   Physical Exam: BP 136/74   Pulse 68   Ht 5\' 10"  (1.778 m)   Wt (!) 375 lb (170.1 kg)    BMI 53.81 kg/m   Constitutional:  Alert and oriented, No acute distress. HEENT: [place] AT, moist mucus membranes.  Trachea midline, no masses. Cardiovascular: No clubbing, cyanosis, or edema. Respiratory: Normal respiratory effort, no increased work of breathing. GI: Abdomen is soft, nontender, nondistended, no abdominal masses GU: No CVA tenderness Skin: No rashes, bruises or suspicious lesions. Neurologic: Grossly intact, no focal deficits, moving all 4 extremities. Psychiatric: Normal mood and affect.    Assessment & Plan:    1. Other stricture of bulbous urethra in  male Recurrent bulbar urethral stricture-symptomatic Discussed balloon dilation with Optilume which decreases the chances of stricture recurrence He desires to schedule.  Is on Eliquis and would prefer to hold.  Also on O2 and will need medical clearance The procedure was discussed including potential risks of bleeding, infection/sepsis and recurrent stricture.   Abbie Sons, [place] [address], [place], [place] [address] [phone]

## 2022-08-18 NOTE — Progress Notes (Signed)
Surgical Physician Order Form [facility]  * Scheduling expectation : Next Available  *Length of Case: 30 min  *Clearance needed: yes  *Anticoagulation Instructions: Hold all anticoagulants  *Aspirin Instructions: N/A  *Post-op visit Date/Instructions:   Cath removal 48 hours  *Diagnosis: Urethral Stricture  *Procedure:  Cysto w/urethral dilation [id number]) Optilume   Additional orders: N/A  -Admit type: OUTpatient  -Anesthesia: MAC  -VTE Prophylaxis Standing Order SCD's       Other:   -Standing Lab Orders Per Anesthesia    Lab other: UA&Urine Culture  -Standing Test orders EKG/Chest x-ray per Anesthesia       Test other:   - Medications: Ancef 3 g  -Other orders:  N/A

## 2022-08-18 NOTE — Progress Notes (Signed)
   08/18/22  CC:  Chief Complaint  Patient presents with   Cysto    HPI: Refer to Sam Vaillancourt's note 07/20/2022  Blood pressure 136/74, pulse 68, height 5\' 10"  (1.778 m), weight (!) 375 lb (170.1 kg). NED. A&Ox3.   No respiratory distress   Abd soft, NT, ND Normal phallus with bilateral descended testicles  Cystoscopy Procedure Note  Patient identification was confirmed, informed consent was obtained, and patient was prepped using Betadine solution.  Lidocaine jelly was administered per urethral meatus.     Pre-Procedure: - Inspection reveals a normal caliber urethral meatus.  Procedure: The flexible cystoscope was introduced without difficulty -Recurrent bulbar stricture ~ 10 French  Post-Procedure: - Patient tolerated the procedure well  Assessment/ Plan: Recurrent bulbar urethral stricture-symptomatic Discussed balloon dilation with Optilume which decreases the chances of stricture recurrence He desires to schedule.  Is on Eliquis and would prefer to hold.  Also on O2 and will need medical clearance The procedure was discussed including potential risks of bleeding, infection/sepsis and recurrent stricture.    Abbie Sons, MD

## 2022-08-19 ENCOUNTER — Telehealth: Payer: Self-pay | Admitting: Urgent Care

## 2022-08-19 ENCOUNTER — Telehealth: Payer: Self-pay | Admitting: *Deleted

## 2022-08-19 NOTE — Progress Notes (Signed)
   [facility]  Surgery Date: Date: 09/06/2022  Surgeon: Dr. John Giovanni, MD  Inpt ( No  )   Outpt (Yes)   Obs ( No  )   Diagnosis: N35.812 Urethral Stricture  -CPT: [id number]  Surgery: Cystoscopy with Urethral Balloon Dilation using Optilume  Stop Anticoagulations: Yes, will need to hold Eliquis  Cardiac/Medical/Pulmonary Clearance needed: yes, spoke with Honor Loh, NP with [facility] and he will start contact with Cards and Pulm.   Clearance needed from Dr: [place] [facility]   Clearance request sent on: Date: 08/19/22  *Orders entered into EPIC  Date: 08/19/22   *Case booked in [place]  Date: 08/19/22  *Notified pt of Surgery: Date: 08/18/2022  PRE-OP UA & CX: yes, obtained in clinic on 08/18/2022  *Placed into Prior Authorization Work Fabio Bering Date: 08/19/22  Assistant/laser/rep:No  Patient has a caregiver who will discuss his medications/pre-op instructions-- Clayburn Pert [phone].

## 2022-08-19 NOTE — Telephone Encounter (Signed)
Patient with diagnosis of afib on Eliquis for anticoagulation.    Procedure: CYSTOSCOPY WITH URETHRAL BALLOON DILATATION USING OPTILUME  Date of procedure: 09/06/22  CHA2DS2-VASc Score = 5  This indicates a 7.2% annual risk of stroke. The patient's score is based upon: CHF History: 1 HTN History: 1 Diabetes History: 1 Stroke History: 2 Vascular Disease History: 0 Age Score: 0 Gender Score: 0   Questionable TIA: Acute CVA 03/02/2021   Last Assessment & Plan: Formatting of this note might be different from the original. Admit to medical ICU for close monitoring, patient was given tPA by teleneurology in the emergency department CT head on admission showed no acute intracranial abnormality. CT code stroke perfusion study showed left posterior MCA territory with increased T-max but preserved cerebral blood flow. Smaller distal branches of the left middle cerebral artery compared to the right which may represent nonocclusive thrombus. MRI brain was done which was unremarkable for any acute stroke.   CrCl >114mL/min Platelet count 252K  Per office protocol, patient can hold Eliquis for 1-2 days prior to procedure.    **This guidance is not considered finalized until pre-operative APP has relayed final recommendations.**

## 2022-08-19 NOTE — Telephone Encounter (Signed)
-----   Message from Karen Kitchens, NP sent at 08/19/2022 11:49 AM EDT ----- Regarding: Request for pre-operative cardiac clearance Request for pre-operative cardiac clearance:  1. What type of surgery is being performed?  CYSTOSCOPY WITH URETHRAL BALLOON DILATATION USING OPTILUME  2. When is this surgery scheduled?  09/06/2022  3. Type of clearance being requested (medical, pharmacy, both)? BOTH   4. Are there any medications that need to be held prior to surgery? APIXABAN  5. Practice name and name of physician performing surgery?  Performing surgeon: Dr. John Giovanni, MD Requesting clearance: Honor Loh, FNP-C    6. Anesthesia type (none, local, MAC, general)? MAC  7. What is the office phone and fax number?   Phone: [phone] Fax: [phone]  ATTENTION: Unable to create telephone message as per your standard workflow. Directed by [facility] providers to send requests for cardiac clearance to this pool for appropriate distribution to provider covering pre-operative clearances.   Honor Loh, MSN, APRN, FNP-C, CEN [facility]  Peri-operative Services Nurse Practitioner Phone: [phone] 08/19/22 11:49 AM

## 2022-08-19 NOTE — Progress Notes (Signed)
  Perioperative Services Pre-Admission/Anesthesia Testing     Date: 08/19/22  Name: Daniel [name] MRN:   [id number]  Re: Request from surgery for clearance prior to scheduled procedure  Patient is scheduled to undergo a CYSTOSCOPY WITH URETHRAL BALLOON DILATATION USING OPTILUME on 09/05/2021 with Dr. John Giovanni, MD. Patient has not been scheduled for his PAT appointment at this point, thus has not undergone review by PAT RN and/or APP. Received communication from primary attending surgeon's office requesting that patient be submitted for clearance from PCCM and cardiology.   PROVIDER SPECIALTY FAXED TO   Loralie Champagne, MD  Cardiology Routed to preop APP pool for review and clearance  Kara Mead, MD Pulmonary Medicine [phone]   Plan:  Clearance documents generated and faxed to appropriate provider(s) as noted above. Note will be updated to reflect communication with provider's office as it relates to clearance being provided and/or the need for office visit prior to clearance for surgery being issued.   Honor Loh, MSN, APRN, FNP-C, CEN [facility]  Peri-operative Services Nurse Practitioner Phone: [phone] 08/19/22 11:53 AM  NOTE: This note has been prepared using Dragon dictation software. Despite my best ability to proofread, there is always the potential that unintentional transcriptional errors may still occur from this process.

## 2022-08-21 ENCOUNTER — Encounter: Payer: Self-pay | Admitting: Emergency Medicine

## 2022-08-21 ENCOUNTER — Emergency Department
Admission: EM | Admit: 2022-08-21 | Discharge: 2022-08-22 | Disposition: A | Discharge status: Home / Self Care | Payer: Medicaid Other | Attending: Emergency Medicine | Admitting: Emergency Medicine

## 2022-08-21 ENCOUNTER — Emergency Department: Payer: Medicaid Other

## 2022-08-21 DIAGNOSIS — R55 Syncope and collapse: Secondary | ICD-10-CM

## 2022-08-21 DIAGNOSIS — M25552 Pain in left hip: Secondary | ICD-10-CM | POA: Insufficient documentation

## 2022-08-21 DIAGNOSIS — I509 Heart failure, unspecified: Secondary | ICD-10-CM | POA: Insufficient documentation

## 2022-08-21 DIAGNOSIS — Z7901 Long term (current) use of anticoagulants: Secondary | ICD-10-CM | POA: Insufficient documentation

## 2022-08-21 DIAGNOSIS — W19XXXA Unspecified fall, initial encounter: Secondary | ICD-10-CM | POA: Diagnosis not present

## 2022-08-21 DIAGNOSIS — R079 Chest pain, unspecified: Secondary | ICD-10-CM

## 2022-08-21 DIAGNOSIS — E119 Type 2 diabetes mellitus without complications: Secondary | ICD-10-CM | POA: Diagnosis not present

## 2022-08-21 DIAGNOSIS — I4891 Unspecified atrial fibrillation: Secondary | ICD-10-CM | POA: Diagnosis not present

## 2022-08-21 DIAGNOSIS — I110 Hypertensive heart disease with heart failure: Secondary | ICD-10-CM | POA: Diagnosis not present

## 2022-08-21 LAB — CBC
HCT: 41.8 % (ref 39.0–52.0)
Hemoglobin: 13.1 g/dL (ref 13.0–17.0)
MCH: 28 pg (ref 26.0–34.0)
MCHC: 31.3 g/dL (ref 30.0–36.0)
MCV: 89.3 fL (ref 80.0–100.0)
Platelets: 246 10*3/uL (ref 150–400)
RBC: 4.68 MIL/uL (ref 4.22–5.81)
RDW: 13.9 % (ref 11.5–15.5)
WBC: 7.3 10*3/uL (ref 4.0–10.5)
nRBC: 0 % (ref 0.0–0.2)

## 2022-08-21 LAB — URINALYSIS, W/ REFLEX TO CULTURE (INFECTION SUSPECTED)
Bacteria, UA: NONE SEEN
Bilirubin Urine: NEGATIVE
Glucose, UA: >=500 mg/dL — AB
Hgb urine dipstick: NEGATIVE
Ketones, ur: NEGATIVE mg/dL
Leukocytes,Ua: NEGATIVE
Nitrite: NEGATIVE
Protein, ur: NEGATIVE mg/dL
Specific Gravity, Urine: 1.015 (ref 1.005–1.030)
pH: 6 (ref 5.0–8.0)

## 2022-08-21 LAB — BASIC METABOLIC PANEL
Anion gap: 8 (ref 5–15)
BUN: 12 mg/dL (ref 6–20)
CO2: 29 mmol/L (ref 22–32)
Calcium: 8.8 mg/dL — ABNORMAL LOW (ref 8.9–10.3)
Chloride: 98 mmol/L (ref 98–111)
Creatinine, Ser: 0.95 mg/dL (ref 0.61–1.24)
GFR, Estimated: >60 mL/min (ref >60)
Glucose, Bld: 142 mg/dL — ABNORMAL HIGH (ref 70–99)
Potassium: 3.6 mmol/L (ref 3.5–5.1)
Sodium: 135 mmol/L (ref 135–145)

## 2022-08-21 LAB — TROPONIN I (HIGH SENSITIVITY): Troponin I (High Sensitivity): 2 ng/L (ref <18)

## 2022-08-21 MED ORDER — LIDOCAINE 5 % EX PTCH
1.0000 | MEDICATED_PATCH | CUTANEOUS | Status: DC
  Administered 2022-08-22: 1 via TRANSDERMAL
  Filled 2022-08-21: qty 1

## 2022-08-21 MED ORDER — ACETAMINOPHEN 500 MG PO TABS
1000.0000 mg | ORAL_TABLET | Freq: Once | ORAL | Status: AC
  Administered 2022-08-22: 1000 mg via ORAL
  Filled 2022-08-21: qty 2

## 2022-08-21 NOTE — ED Triage Notes (Signed)
Pt to ED via [facility] from group home. Per EMS pt c/o feeling anxious, states he thinks he had a "heart attack or a stroke". Per EMS pt was HR of 80, hx of A-fib, no a-fib noted en route. Per EMS pt is A&O x4. Pt on 4L via [place] at baseline. Per EMS stroke screen negative.

## 2022-08-21 NOTE — ED Provider Notes (Signed)
[facility] Provider Note    Event Date/Time   First MD Initiated Contact with Patient 08/21/22 2204     (approximate)   History   Fall and Chest Pain   HPI  Daniel [name] is a 32 y.o. male   Past medical history of atrial fibrillation on Eliquis, CHF, hypertension and hyperlipidemia, diabetes, OSA, schizoaffective, seizure disorder, conversion disorder, urethral stricture, who presents to the emergency department with syncopal episode while urinating today.  He has some pain with urination at baseline due to urethral stricture and has to force his urination stream.  He felt lightheaded and passed out earlier today during urination.    He is unsure of head strike.  He did injure his left hip.  He has no other acute medical complaints at this time.  He is here with a caretaker who was in the other room and heard him fall and came no significant downtime no seizure activity no postictal period.  Independent Historian contributed to assessment above: Caretaker at bedside  External Medical Documents Reviewed: Emergency department visit dated 08/05/2022 for dizziness chest tightness lower back pain      Physical Exam   Triage Vital Signs: ED Triage Vitals [08/21/22 2122]  Enc Vitals Group     BP 135/64     Pulse Rate 80     Resp 18     Temp 98.4 F (36.9 C)     Temp Source Oral     SpO2 100 %     Weight      Height      Head Circumference      Peak Flow      Pain Score      Pain Loc      Pain Edu?      Excl. in [place]?     Most recent vital signs: Vitals:   08/21/22 2122  BP: 135/64  Pulse: 80  Resp: 18  Temp: 98.4 F (36.9 C)  SpO2: 100%    General: Awake, no distress.  CV:  Good peripheral perfusion.  Resp:  Normal effort.  Abd:  No distention.  Other:  Awake alert comfortable appearing.  Mild tenderness to palpation around the left hip but he is able to range fully.  Neurovascular intact.  Lungs clear abdomen soft and  nontender.   ED Results / Procedures / Treatments   Labs (all labs ordered are listed, but only abnormal results are displayed) Labs Reviewed  BASIC METABOLIC PANEL - Abnormal; Notable for the following components:      Result Value   Glucose, Bld 142 (*)    Calcium 8.8 (*)    All other components within normal limits  URINALYSIS, W/ REFLEX TO CULTURE (INFECTION SUSPECTED) - Abnormal; Notable for the following components:   Color, Urine YELLOW (*)    APPearance CLEAR (*)    Glucose, UA >=500 (*)    All other components within normal limits  CBC  TROPONIN I (HIGH SENSITIVITY)  TROPONIN I (HIGH SENSITIVITY)     I ordered and reviewed the above labs they are notable for Alysis without inflammatory changes or bacteria to suggest urinary tract infection.  Troponin initially negative.  EKG  ED ECG REPORT I, Lucillie Garfinkel, the attending physician, personally viewed and interpreted this ECG.   Date: 08/21/2022  EKG Time: 2124  Rate: 80  Rhythm: NSR  Axis: NL  Intervals: long qtc  ST&T Change: no acute ischemic changes     RADIOLOGY I  independently reviewed and interpreted CT scan of the head see no obvious bleeding or midline shift   PROCEDURES:  Critical Care performed: No  Procedures   MEDICATIONS ORDERED IN ED: Medications  lidocaine (LIDODERM) 5 % 1 patch (1 patch Transdermal Patch Applied 08/22/22 0013)  acetaminophen (TYLENOL) tablet 1,000 mg (1,000 mg Oral Given 08/22/22 0013)    External physician / consultants:  I spoke with cardiology Dr. Rockey Situ regarding care plan for this patient.   IMPRESSION / MDM / ASSESSMENT AND PLAN / ED COURSE  I reviewed the triage vital signs and the nursing notes.                                Patient's presentation is most consistent with acute presentation with potential threat to life or bodily function.  Differential diagnosis includes, but is not limited to, syncope due to dysrhythmia, ACS, electrolyte disturbance,  traumatic injury leading to intracranial bleeding, hip fracture dislocation, urinary tract infection   The patient is on the cardiac monitor to evaluate for evidence of arrhythmia and/or significant heart rate changes.  MDM:   Syncope  episode most likely due to vasovagal reaction in the setting of straining to urinate with urethral stricture.  His EKG shows saddleback ST changes in V1 V2 that resolved on recheck, I did discuss with Dr. Rockey Situ but he does not think that this appears to be [place].  Troponins have been flat.  Urinalysis shows no infection.  Traumatic injuries assessed with CT head and hip x-ray which are negative.  Patient has been stable.   I considered hospitalization for admission or observation however given stability and negative workup as above I think outpatient follow-up at this time is most appropriate.  I messaged Dr. Rockey Situ and requested that he follow-up with this patient for Holter monitoring as needed.        FINAL CLINICAL IMPRESSION(S) / ED DIAGNOSES   Final diagnoses:  Syncope and collapse     Rx / DC Orders   ED Discharge Orders     None        Note:  This document was prepared using Dragon voice recognition software and may include unintentional dictation errors.    Lucillie Garfinkel, MD 08/22/22 Laureen Abrahams

## 2022-08-21 NOTE — ED Triage Notes (Signed)
Pt BIB [facility] from group home following a fall. Pt states he got dizzy and landed on his L hip, unsure if he hit his head. Endorses CP that started after the fall. Hx of CHF- states he's supposed to wear O2 but is 100% on RA upon arrival with no SOB sx. A&Ox4 at this time. Denies SOB.

## 2022-08-22 ENCOUNTER — Ambulatory Visit
Admission: RE | Admit: 2022-08-22 | Discharge: 2022-08-22 | Disposition: A | Discharge status: Home / Self Care | Payer: Medicaid Other | Source: Ambulatory Visit | Attending: Cardiology | Admitting: Cardiology

## 2022-08-22 ENCOUNTER — Telehealth: Payer: Self-pay

## 2022-08-22 ENCOUNTER — Other Ambulatory Visit: Payer: Self-pay

## 2022-08-22 ENCOUNTER — Ambulatory Visit: Payer: Medicaid Other | Attending: Cardiovascular Disease

## 2022-08-22 DIAGNOSIS — I110 Hypertensive heart disease with heart failure: Secondary | ICD-10-CM | POA: Insufficient documentation

## 2022-08-22 DIAGNOSIS — R55 Syncope and collapse: Secondary | ICD-10-CM

## 2022-08-22 DIAGNOSIS — I4892 Unspecified atrial flutter: Secondary | ICD-10-CM | POA: Diagnosis not present

## 2022-08-22 DIAGNOSIS — E119 Type 2 diabetes mellitus without complications: Secondary | ICD-10-CM | POA: Insufficient documentation

## 2022-08-22 DIAGNOSIS — I4891 Unspecified atrial fibrillation: Secondary | ICD-10-CM | POA: Diagnosis not present

## 2022-08-22 DIAGNOSIS — I5022 Chronic systolic (congestive) heart failure: Secondary | ICD-10-CM | POA: Diagnosis not present

## 2022-08-22 DIAGNOSIS — I509 Heart failure, unspecified: Secondary | ICD-10-CM | POA: Insufficient documentation

## 2022-08-22 LAB — ECHOCARDIOGRAM COMPLETE
Height: 70 in
S' Lateral: 3.3 cm
Weight: 6000.04 oz

## 2022-08-22 LAB — TROPONIN I (HIGH SENSITIVITY): Troponin I (High Sensitivity): 4 ng/L (ref <18)

## 2022-08-22 LAB — CULTURE, URINE COMPREHENSIVE

## 2022-08-22 NOTE — Discharge Instructions (Signed)
Thank you for choosing us for your health care today!  Please see your primary doctor this week for a follow up appointment.   Sometimes, in the early stages of certain disease courses it is difficult to detect in the emergency department evaluation -- so, it is important that you continue to monitor your symptoms and call your doctor right away or return to the emergency department if you develop any new or worsening symptoms.  Please go to the following website to schedule new (and existing) patient appointments:   [email or web address]  If you do not have a primary doctor try calling the following clinics to establish care:  If you have insurance:  [facility] [phone] [address] [place] [address]   [facility]  [phone] [address] [place] [address] [place] [address]   If you do not have insurance:  [facility]  [phone] [address] [place] [address]   The following is another list of primary care offices in the area who are accepting new patients at this time.  Please reach out to one of them directly and let them know you would like to schedule an appointment to follow up on an Emergency Department visit, and/or to establish a new primary care provider (PCP).  There are likely other primary care clinics in the are who are accepting new patients, but this is an excellent place to start:  [facility] Lead physician: Dr Angela Bacigalupo [address] [place], [place] [address] [phone]  [facility] Lead Physician: Dr Krichna Sowles [address], [place], [place] [address] [phone]  [facility]  Lead Physician: Dr Megan Johnson [address], [place], [place] [address] [phone]  [facility] [place] [facility] Lead Physician: Dr Alex Karamalegos [address], [place], [place] [address] [phone]  [facility] [place] Lead Physician: Dr Laura Berglund [address], [place],  [address] [phone]   It was my pleasure to care for you today.   Concepcion Kirkpatrick S. Ulises Wolfinger, MD  

## 2022-08-22 NOTE — Progress Notes (Signed)
*  PRELIMINARY RESULTS* Echocardiogram 2D Echocardiogram has been performed.  Daniel [name] 08/22/2022, 10:35 AM

## 2022-08-22 NOTE — Telephone Encounter (Addendum)
2 week Zio monitor ordered as requested by MD.    ----- Message from Minna Merritts, MD sent at 08/22/2022  7:59 AM EDT ----- Regarding: monitor Received phone call from the ER with request for Zio monitor in the setting of syncope, concerning for vasovagal event Triage can we order a Zio monitor, 2 weeks for him I can forward the results to Dr. Rowland Lathe  (primary cardiologist ) when they come in unless [place] wants to be ordering physician Thx TG

## 2022-08-22 NOTE — ED Notes (Signed)
Pt A&O x4, no obvious distress noted, respirations regular/unlabored. Pt verbalizes understanding of discharge instructions. Pt able to ambulate from ED independently.   

## 2022-08-22 NOTE — Telephone Encounter (Signed)
   Primary Cardiologist: Nelva Bush, MD  Chart reviewed as part of pre-operative protocol coverage. Given past medical history and time since last visit, based on ACC/AHA guidelines, Torez Bratland would be at acceptable risk for the planned procedure without further cardiovascular testing.   Per Dr. Aundra Dubin, he is at acceptable risk to undergo low risk procedure without further testing at this time (date of procedure 09/06/22).   Per office protocol, patient can hold Eliquis for 1-2 days prior to procedure.   I will route this recommendation to the requesting party via Epic fax function and remove from pre-op pool.  Please call with questions.  Emmaline Life, NP-C  08/22/2022, 8:06 AM [address] Office [phone] Fax [phone]

## 2022-08-23 ENCOUNTER — Telehealth: Payer: Self-pay

## 2022-08-23 NOTE — Telephone Encounter (Signed)
Received clearance back from [facility]. I have notified [place] that we will hold the Eliquis for 2 days prior to surgery. His last dosage will be on April 6th, 2024. Daniel [name] verbalized understanding and will remove from bubble pack.

## 2022-08-29 ENCOUNTER — Other Ambulatory Visit: Payer: Self-pay

## 2022-08-29 ENCOUNTER — Encounter
Admission: RE | Admit: 2022-08-29 | Discharge: 2022-08-29 | Disposition: A | Discharge status: Home / Self Care | Payer: Medicaid Other | Source: Ambulatory Visit | Attending: Urology | Admitting: Urology

## 2022-08-29 VITALS — Ht 77.0 in | Wt 380.0 lb

## 2022-08-29 DIAGNOSIS — Z01818 Encounter for other preprocedural examination: Secondary | ICD-10-CM

## 2022-08-29 HISTORY — DX: Gastro-esophageal reflux disease without esophagitis: K21.9

## 2022-08-29 HISTORY — DX: Dyspnea, unspecified: R06.00

## 2022-08-29 NOTE — Patient Instructions (Addendum)
Your procedure is scheduled on: Tuesday 09/06/22 To find out your arrival time, please call [phone] between [place] on:   Monday 09/05/22 Report to the Registration Desk on the 1st floor of the [facility]. Valet parking is available.  If your arrival time is 6:00 am, do not arrive before that time as the [facility] entrance doors do not open until 6:00 am.  REMEMBER: Instructions that are not followed completely may result in serious medical risk, up to and including death; or upon the discretion of your surgeon and anesthesiologist your surgery may need to be rescheduled.  Do not eat food or drink any liquids after midnight the night before surgery.  No gum chewing or hard candies.  One week prior to surgery: Stop Anti-inflammatories (NSAIDS) such as Advil, Aleve, Ibuprofen, Motrin, Naproxen, Naprosyn and Aspirin based products such as Excedrin, Goody's Powder, BC Powder. You may however, continue to take Tylenol if needed for pain up until the day of surgery.  Stop ANY OVER THE COUNTER supplements until after surgery.  Continue taking all prescribed medications with the exception of the following: Stop the Eliquis 2 days before surgery, last dose Saturday night 09/03/22. Stop Farxiga 3 days before surgery, last dose Friday 09/02/22.  TAKE ONLY THESE MEDICATIONS THE MORNING OF SURGERY WITH A SIP OF WATER:  allopurinol (ZYLOPRIM) 300 MG tablet  atorvastatin (LIPITOR) 80 MG tablet  benztropine (COGENTIN) 1 MG tablet  diltiazem (CARDIZEM CD) 360 MG 24 hr capsule  divalproex (DEPAKOTE) 1000 MG DR tablet  escitalopram (LEXAPRO) 10 MG tablet  hydrOXYzine (ATARAX) 25 MG tablet  INVEGA 9 MG 24 hr tablet  loratadine (CLARITIN) 10 MG tablet  metoprolol succinate (TOPROL XL) 150 MG 24 hr tablet  tamsulosin (FLOMAX) 0.4 MG CAPS capsule  omeprazole (PRILOSEC) 20 MG capsule  Antacid (take one the night before and one on the morning of surgery - helps to prevent nausea after  surgery.)  Use inhalers on the day of surgery and bring to the hospital.  No Smoking including e-cigarettes for 24 hours before surgery.  No chewable tobacco products for at least 6 hours before surgery.  No nicotine patches on the day of surgery.  Do not use any "recreational" drugs for at least a week (preferably 2 weeks) before your surgery.  Please be advised that the combination of cocaine and anesthesia may have negative outcomes, up to and including death. If you test positive for cocaine, your surgery will be cancelled.  On the morning of surgery brush your teeth with toothpaste and water, you may rinse your mouth with mouthwash if you wish. Do not swallow any toothpaste or mouthwash.  Use CHG Soap or wipes as directed on instruction sheet. Shower using regular soap the morning of surgery.  Do not wear lotions, powders, or perfumes.   Do not shave body hair from the neck down 48 hours before surgery.  Wear comfortable clothing (specific to your surgery type) to the hospital.  Do not wear jewelry, make-up, hairpins, clips or nail polish.  Contact lenses, hearing aids and dentures may not be worn into surgery.  Do not bring valuables to the hospital. [facility] is not responsible for any missing/lost belongings or valuables.   Notify your doctor if there is any change in your medical condition (cold, fever, infection).  If you are being discharged the day of surgery, you will not be allowed to drive home. You will need a responsible individual to drive you home and stay  with you for 24 hours after surgery.   If you are taking public transportation, you will need to have a responsible individual with you.  If you are being admitted to the hospital overnight, leave your suitcase in the car. After surgery it may be brought to your room.  In case of increased patient census, it may be necessary for you, the patient, to continue your postoperative care in the Same Day Surgery  department.  After surgery, you can help prevent lung complications by doing breathing exercises.  Take deep breaths and cough every 1-2 hours. Your doctor may order a device called an Incentive Spirometer to help you take deep breaths. When coughing or sneezing, hold a pillow firmly against your incision with both hands. This is called "splinting." Doing this helps protect your incision. It also decreases belly discomfort.  Surgery Visitation Policy:  Patients undergoing a surgery or procedure may have two family members or support persons with them as long as the person is not COVID-19 positive or experiencing its symptoms.   Inpatient Visitation:    Please call the Pre-admissions Testing Dept. at [phone] if you have any questions about these instructions.

## 2022-08-30 ENCOUNTER — Telehealth: Payer: Self-pay | Admitting: Pulmonary Disease

## 2022-08-30 NOTE — Telephone Encounter (Signed)
Received request for preop clearance for cystoscopy for this patient.  He will need appointment scheduled with APP -seen by Derl Barrow and has not returned for post BiPAP follow-up appointment

## 2022-08-31 ENCOUNTER — Encounter: Payer: Self-pay | Admitting: Urology

## 2022-08-31 NOTE — Telephone Encounter (Signed)
ATC X1 LVM for patient to call the office back. Please schedule a apt with an APP

## 2022-08-31 NOTE — Progress Notes (Signed)
Perioperative / Anesthesia Services  Pre-Admission Testing Clinical Review / Preoperative Anesthesia Consult  Date: 09/01/22  Patient Demographics:  Name: Daniel [name] DOB:   1990/06/13 MRN:   [id number]  Planned Surgical Procedure(s):    Case: [id number] Date/Time: 09/06/22 1011   Procedure: CYSTOSCOPY WITH URETHRAL BALLOON DILATATION USING OPTILUME   Anesthesia type: Monitor Anesthesia Care   Pre-op diagnosis: Urethral Stricture   Location: [facility] OR ROOM 10 / [place] ORS FOR ANESTHESIA GROUP   Surgeons: Abbie Sons, MD     NOTE: Available PAT nursing documentation and vital signs have been reviewed. Clinical nursing staff has updated patient's PMH/PSHx, current medication list, and drug allergies/intolerances to ensure comprehensive history available to assist in medical decision making as it pertains to the aforementioned surgical procedure and anticipated anesthetic course. Extensive review of available clinical information personally performed.  PMH and PSHx updated with any diagnoses/procedures that  may have been inadvertently omitted during his intake with the pre-admission testing department's nursing staff.  Clinical Discussion:  Daniel [name] is a 32 y.o. male who is submitted for pre-surgical anesthesia review and clearance prior to him undergoing the above procedure. Patient is a Former Smoker (quit 08/2021). Pertinent PMH includes: atrial fibrillation/flutter, cardiomyopathy, CHF, TIA, angina, HTN, HLD, T2DM, OSAH (requires nocturnal PAP therapy), dyspnea, GERD (on daily PPI), urethral stricture, seizure disorder, conversion disorder, schizoaffective disorder, insomnia.   Patient is followed by cardiology Phoebe Sharps, MD). He was last seen in the cardiology clinic on 08/05/2022; notes reviewed.  At the time of his clinic visit, patient reporting episodes of atypical chest pain that was felt to be pleuritic in nature.  He also had complaints of chronic  exertional dyspnea requiring the use of supplemental oxygen for the last 2 years.  He is jointly managed by pulmonary medicine.  Patient denied any PND, orthopnea, palpitations, significant peripheral edema, vertiginous symptoms, or presyncope/syncope. Patient with a past medical history significant for cardiovascular diagnoses. Documented physical exam was grossly benign, providing no evidence of acute exacerbation and/or decompensation of the patient's known cardiovascular conditions.  Patient reported to have suffered a TIA in the past.  He is unable to provide a date of the event.  Patient has no resulting neurological deficits resulting from reported neurological event.  Patient has a diagnosis of cardiomyopathy.  Cardiac function has been serially monitored since diagnosis.  Most recent TTE was performed on 10/22/2021 revealing mild reduced left ventricular systolic function with an EF of 45-50%.  Cardiology noted that EF was difficult to estimate in the setting of atrial fibrillation with a rate being greater than 100 bpm.  Left ventricle demonstrated global hypokinesis.  Diastolic Doppler parameters were indeterminate.  Right ventricular size and function were normal.  There was no significant valvular regurgitation.  All transvalvular gradients were noted to be normal providing no evidence suggestive of valvular stenosis.  Patient with an atrial fibrillation diagnosis; CHA2DS2-VASc Score = 5 (CHF, HTN, TIA x 2, T2DM). His rate and rhythm are currently being maintained on oral diltiazem + metoprolol succinate. He is chronically anticoagulated using apixaban; reported to be compliant with therapy with no evidence or reports of GI bleeding.  Heart failure and blood pressure well controlled at 126/66 mmHg on currently prescribed CCB (diltiazem), diuretic (furosemide), beta-blocker (metoprolol succinate) and ARB/ARNI (Entresto therapies. He is on a atorvastatin for his HLD diagnosis and further ASCVD  prevention. T2DM well controlled on currently prescribed regimen; last HgbA1c was 5.8% when checked on 08/05/2022.  Patient does have a severe  OSAH diagnosis and requires nocturnal PAP therapy; compliant.  Functional capacity limited by morbid obesity, supplemental oxygen use, and patient's multiple medical comorbidities.  With that being said, patient still felt to be able to achieve at least 4 METS of physical activity without experiencing any degree of angina/anginal equivalent symptoms.  Given patient's ongoing shortness of breath, advanced heart failure provider with plans for repeat TTE and diagnostic RIGHT cardiac catheterization.  Patient to follow-up with outpatient cardiology following these procedures.  Since patient was last seen by cardiology/heart failure clinic, patient has partially undergone the requested cardiovascular testing.  RIGHT cardiac catheterization pending scheduling.  Echocardiogram was performed as follows.  TTE was performed on 10/22/2021 revealing a normal left ventricular systolic function with an EF of 55 to 60%.  There were no regional wall motion abnormalities.  Left ventricular internal cavity size was mildly dilated.  Diastolic Doppler parameters indeterminate.  Right ventricular size and function normal.  All transvalvular gradients were noted to be normal providing no evidence suggestive of valvular stenosis.  Daniel [name] is scheduled for CYSTOSCOPY WITH URETHRAL BALLOON DILATATION USING OPTILUME on 09/06/2022 with Dr. John Giovanni, MD.  Given patient's past medical history significant for cardiovascular and cardiopulmonary diagnoses, presurgical clearances from cardiology and pulmonary medicine were sought by the PAT team.  Despite multiple attempts to contact the patient, pulmonary medicine advising that they were unable to get in touch with him to schedule follow-up appointment for clearance.  No recommendations able to be provided by pulmonary medicine.   Cardiology clearance received as follows.  Per cardiology, "based ACC/AHA guidelines, the patient's past medical history, and the amount of time since his last clinic visit, this patient would be at an overall ACCEPTABLE risk for the planned procedure without further cardiovascular testing or intervention at this time".   Again, patient is on daily oral anticoagulation therapy. Patient has been instructed on recommendations from her cardiologist for holding her apixaban dose for 2 days prior to her procedure, with plans to restart as soon as postoperative bleeding risk felt to be minimized by her primary attending surgeon. Patient is aware that his last dose of apixaban should be on 09/03/2022.  Patient denies previous perioperative complications with anesthesia in the past. In review of the available records, it is noted that patient underwent a general anesthetic course here at [facility] ([facility]) in 08/2021 without documented complications.      08/29/2022   10:40 AM 08/22/2022   12:40 AM 08/21/2022    9:22 PM  Vitals with BMI  Height 6\' 5"  5\' 10"    Weight 380 lbs 375 lbs   BMI [id number]   Systolic   [id number]  Diastolic   64  Pulse   80    Providers/Specialists:   NOTE: Primary physician provider listed below. Patient may have been seen by APP or partner within same practice.   PROVIDER ROLE / SPECIALTY LAST Claud Kelp, MD Urology (Surgeon) 08/18/2022  Lauretta Grill, NP Primary Care Provider ???  End, Harrell Gave, MD Cardiology 06/17/2022  Loralie Champagne, MD Advanced Heart Failure 08/05/2022  Kara Mead, MD Pulmonary Medicine 03/23/2022   Allergies:  Haldol [haloperidol], Dermatitis antigen, Meperidine, Abilify [aripiprazole], Demerol [meperidine hcl], and Tape  Current Home Medications:   No current facility-administered medications for this encounter.    acetaminophen (TYLENOL) 650 MG CR tablet   divalproex (DEPAKOTE) 500 MG DR  tablet   albuterol (VENTOLIN HFA) 108 (90 Base) MCG/ACT inhaler  allopurinol (ZYLOPRIM) 300 MG tablet   atorvastatin (LIPITOR) 80 MG tablet   baclofen (LIORESAL) 10 MG tablet   benztropine (COGENTIN) 1 MG tablet   colchicine 0.6 MG tablet   cyclobenzaprine (FLEXERIL) 5 MG tablet   diltiazem (CARDIZEM CD) 360 MG 24 hr capsule   ELIQUIS 5 MG TABS tablet   escitalopram (LEXAPRO) 10 MG tablet   FARXIGA 10 MG TABS tablet   fluticasone (FLONASE) 50 MCG/ACT nasal spray   furosemide (LASIX) 20 MG tablet   guaiFENesin (MUCINEX) 600 MG 12 hr tablet   hydrOXYzine (ATARAX) 25 MG tablet   hydrOXYzine (ATARAX) 50 MG tablet   ibuprofen (ADVIL) 800 MG tablet   INVEGA 9 MG 24 hr tablet   lithium carbonate (LITHOBID) 300 MG CR tablet   loratadine (CLARITIN) 10 MG tablet   melatonin 3 MG TABS tablet   metoprolol succinate (TOPROL XL) 100 MG 24 hr tablet   naproxen (NAPROSYN) 500 MG tablet   omeprazole (PRILOSEC) 20 MG capsule   sacubitril-valsartan (ENTRESTO) 24-26 MG   tamsulosin (FLOMAX) 0.4 MG CAPS capsule   traMADol (ULTRAM) 50 MG tablet   History:   Past Medical History:  Diagnosis Date   Cardiomyopathy    a.) TTE 08/04/2021: EF 50%; b.) TTE 10/22/2021: EF 45-50-%; c.) TTE 08/22/2022: EF 55-60%   Chest pain    CHF (congestive heart failure)    a.) TTE 08/04/2021: EF 50%, mild LVH, RVSF norm; b.) TTE 10/22/2021: EF 45-50%, glob HK, RVSF norm; c.) TTE 08/22/2022: EF 55-60%, mild dil LV, RVSF norm.   Conversion disorder    Current use of long term anticoagulation    Dyspnea    GERD (gastroesophageal reflux disease)    Hepatic steatosis    Hyperlipidemia    Hypertension    Insomnia    a.) melatonin PRN   Long term current use of anticoagulant    a.) apixaban   OSA on CPAP    Persistent atrial fibrillation and flutter ([place])    a.) CHA2DS2VASc = 5 (CHF, HTN, TIA x 2, T2DM);  b.) rate/rhythm maintained on oral diltiazem + metoprolol succinate; chronically anticoagulated with apixaban    Schizoaffective disorder    Seizure disorder    T2DM (type 2 diabetes mellitus)    TIA (transient ischemic attack)    Urethral stricture    a. 08/2021 s/p cystoscopy and urethral dilation.   Past Surgical History:  Procedure Laterality Date   CYSTOSCOPY WITH URETHRAL DILATATION N/A 09/07/2021   Procedure: CYSTOSCOPY WITH URETHRAL DILATATION CATHETER PLACEMENT;  Surgeon: Abbie Sons, MD;  Location: [facility] ORS;  Service: Urology;  Laterality: N/A;   No family history on file. Social History   Tobacco Use   Smoking status: Former    Years: 2    Types: Cigarettes    Quit date: 08/2021    Years since quitting: 1.0   Smokeless tobacco: Never  Vaping Use   Vaping Use: Never used  Substance Use Topics   Alcohol use: Not Currently   Drug use: Never    Pertinent Clinical Results:  LABS:   No visits with results within 3 Day(s) from this visit.  Latest known visit with results is:  Hospital Outpatient Visit on 08/22/2022  Component Date Value Ref Range Status   Weight 08/22/2022 6,000.04  oz Final   Height 08/22/2022 70  in Final   S' Lateral 08/22/2022 3.30  cm Final   Est EF 08/22/2022 55 - 60%   Final    ECG: Date:  08/21/2022 Time ECG obtained: 2141 PM Rate: 81 bpm Rhythm: normal sinus Axis (leads I and aVF): Normal Intervals: PR 162 ms. QRS 96 ms. QTc 464 ms. ST segment and T wave changes: Inferior TWIs Comparison: Similar to previous tracing obtained on 08/05/2022   IMAGING / PROCEDURES: TRANSTHORACIC ECHOCARDIOGRAM performed on 08/22/2022 Left ventricular ejection fraction, by estimation, is 55 to 60%. The left ventricle has normal function. The left ventricle has no regional wall motion abnormalities. The left ventricular internal cavity size was mildly dilated. Left ventricular diastolic function could not be evaluated.  Right ventricular systolic function is normal. The right ventricular size is normal. Tricuspid regurgitation signal is inadequate for  assessing PA pressure.  The mitral valve is normal in structure. No evidence of mitral valve regurgitation. No evidence of mitral stenosis.  The aortic valve is normal in structure. Aortic valve regurgitation is not visualized. No aortic stenosis is present.  Limited images. Only parasternal images obtained.   PULMONARY FUNCTION TESTING performed on 07/28/2022    Latest Ref Rng & Units 07/28/2022    2:05 PM  PFT Results  FVC-Pre L 4.68   FVC-Predicted Pre % 70   Pre FEV1/FVC % % 86   FEV1-Pre L 4.02   FEV1-Predicted Pre % 75   DLCO uncorrected ml/min/mmHg 36.73   DLCO UNC% % 92   DLVA Predicted % 122   TLC L 6.91   TLC % Predicted % 83   RV % Predicted % 130    CT CHEST HIGH RESOLUTION performed on 07/11/2022 No evidence of fibrotic interstitial lung disease. Mild lobular air trapping on expiratory phase imaging, suggestive of small airways disease. No acute abnormality of the lungs.  Impression and Plan:  Lyzander Kolm has been referred for pre-anesthesia review and clearance prior to him undergoing the planned anesthetic and procedural courses. Available labs, pertinent testing, and imaging results were personally reviewed by me in preparation for upcoming operative/procedural course. [facility] medical record has been updated following extensive record review and patient interview with PAT staff.   This patient has been appropriately cleared by cardiology with an overall ACCEPTABLE risk of significant perioperative cardiovascular complications. Based on clinical review performed today (09/01/22), barring any significant acute changes in the patient's overall condition, it is anticipated that he will be able to proceed with the planned surgical intervention. Any acute changes in clinical condition may necessitate his procedure being postponed and/or cancelled. Patient will meet with anesthesia team (MD and/or CRNA) on the day of his procedure for preoperative evaluation/assessment.  Questions regarding anesthetic course will be fielded at that time.   Pre-surgical instructions were reviewed with the patient during his PAT appointment, and questions were fielded to satisfaction by PAT clinical staff. He has been instructed on which medications that he will need to hold prior to surgery, as well as the ones that have been deemed safe/appropriate to take of the day of his procedure. As part of the general education provided by PAT, patient made aware both verbally and in writing, that he would need to abstain from the use of any illegal substances during his perioperative course.  He was advised that failure to follow the provided instructions could necessitate case cancellation or result serious perioperative complications up to and including death. Patient encouraged to contact PAT and/or his surgeon's office to discuss any questions or concerns that may arise prior to surgery; verbalized understanding.   Honor Loh, MSN, APRN, FNP-C, CEN [place]  Peri-operative Services Nurse  Practitioner Phone: [phone] Fax: [phone] 09/01/22 3:02 PM  NOTE: This note has been prepared using Dragon dictation software. Despite my best ability to proofread, there is always the potential that unintentional transcriptional errors may still occur from this process.

## 2022-09-05 MED ORDER — SODIUM CHLORIDE 0.9 % IV SOLN: INTRAVENOUS | Status: DC

## 2022-09-05 MED ORDER — CEFAZOLIN IN SODIUM CHLORIDE 3-0.9 GM/100ML-% IV SOLN
3.0000 g | INTRAVENOUS | Status: AC
  Administered 2022-09-06: 3 g via INTRAVENOUS
  Filled 2022-09-05: qty 100

## 2022-09-05 MED ORDER — ORAL CARE MOUTH RINSE: 15.0000 mL | Freq: Once | OROMUCOSAL | Status: AC

## 2022-09-05 MED ORDER — CHLORHEXIDINE GLUCONATE 0.12 % MT SOLN
15.0000 mL | Freq: Once | OROMUCOSAL | Status: AC
  Administered 2022-09-06: 15 mL via OROMUCOSAL

## 2022-09-05 NOTE — Telephone Encounter (Signed)
ATC X1 LVM for patient to call the office back 

## 2022-09-06 ENCOUNTER — Ambulatory Visit: Payer: Medicaid Other | Admitting: Urgent Care

## 2022-09-06 ENCOUNTER — Other Ambulatory Visit: Payer: Self-pay

## 2022-09-06 ENCOUNTER — Encounter: Payer: Self-pay | Admitting: Urology

## 2022-09-06 ENCOUNTER — Encounter
Admission: RE | Disposition: A | Discharge status: Skilled Nursing Facility | Payer: Self-pay | Source: Home / Self Care | Attending: Urology

## 2022-09-06 ENCOUNTER — Ambulatory Visit
Admission: RE | Admit: 2022-09-06 | Discharge: 2022-09-06 | Disposition: A | Discharge status: Skilled Nursing Facility | Payer: Medicaid Other | Attending: Urology | Admitting: Urology

## 2022-09-06 DIAGNOSIS — E119 Type 2 diabetes mellitus without complications: Secondary | ICD-10-CM | POA: Insufficient documentation

## 2022-09-06 DIAGNOSIS — E785 Hyperlipidemia, unspecified: Secondary | ICD-10-CM | POA: Diagnosis not present

## 2022-09-06 DIAGNOSIS — N35812 Other urethral bulbous stricture, male: Secondary | ICD-10-CM

## 2022-09-06 DIAGNOSIS — Z87891 Personal history of nicotine dependence: Secondary | ICD-10-CM | POA: Insufficient documentation

## 2022-09-06 DIAGNOSIS — I4819 Other persistent atrial fibrillation: Secondary | ICD-10-CM | POA: Diagnosis not present

## 2022-09-06 DIAGNOSIS — I110 Hypertensive heart disease with heart failure: Secondary | ICD-10-CM | POA: Insufficient documentation

## 2022-09-06 DIAGNOSIS — G4733 Obstructive sleep apnea (adult) (pediatric): Secondary | ICD-10-CM | POA: Diagnosis not present

## 2022-09-06 DIAGNOSIS — N35112 Postinfective bulbous urethral stricture, not elsewhere classified: Secondary | ICD-10-CM | POA: Diagnosis not present

## 2022-09-06 DIAGNOSIS — I509 Heart failure, unspecified: Secondary | ICD-10-CM | POA: Diagnosis not present

## 2022-09-06 DIAGNOSIS — N35912 Unspecified bulbous urethral stricture, male: Secondary | ICD-10-CM | POA: Insufficient documentation

## 2022-09-06 DIAGNOSIS — Z7901 Long term (current) use of anticoagulants: Secondary | ICD-10-CM | POA: Diagnosis not present

## 2022-09-06 DIAGNOSIS — Z01818 Encounter for other preprocedural examination: Secondary | ICD-10-CM

## 2022-09-06 HISTORY — DX: Insomnia, unspecified: G47.00

## 2022-09-06 HISTORY — DX: Obstructive sleep apnea (adult) (pediatric): G47.33

## 2022-09-06 HISTORY — PX: CYSTOSCOPY WITH URETHRAL DILATATION: SHX5125

## 2022-09-06 HISTORY — DX: Long term (current) use of anticoagulants: Z79.01

## 2022-09-06 HISTORY — DX: Fatty (change of) liver, not elsewhere classified: K76.0

## 2022-09-06 HISTORY — DX: Type 2 diabetes mellitus without complications: E11.9

## 2022-09-06 LAB — GLUCOSE, CAPILLARY: Glucose-Capillary: 82 mg/dL (ref 70–99)

## 2022-09-06 SURGERY — CYSTOSCOPY, WITH URETHRAL DILATION
Anesthesia: General

## 2022-09-06 MED ORDER — ROCURONIUM BROMIDE 10 MG/ML (PF) SYRINGE
PREFILLED_SYRINGE | INTRAVENOUS | Status: AC
  Filled 2022-09-06: qty 10

## 2022-09-06 MED ORDER — FENTANYL CITRATE (PF) 100 MCG/2ML IJ SOLN: 25.0000 ug | INTRAMUSCULAR | Status: DC | PRN

## 2022-09-06 MED ORDER — ESMOLOL HCL 100 MG/10ML IV SOLN
INTRAVENOUS | Status: AC
  Filled 2022-09-06: qty 10

## 2022-09-06 MED ORDER — OXYCODONE HCL 5 MG PO TABS
5.0000 mg | ORAL_TABLET | Freq: Once | ORAL | Status: AC | PRN
  Administered 2022-09-06: 5 mg via ORAL

## 2022-09-06 MED ORDER — LIDOCAINE HCL URETHRAL/MUCOSAL 2 % EX GEL
CUTANEOUS | Status: AC
  Filled 2022-09-06: qty 6

## 2022-09-06 MED ORDER — PROPOFOL 10 MG/ML IV BOLUS
INTRAVENOUS | Status: DC | PRN
  Administered 2022-09-06: 150 mg via INTRAVENOUS

## 2022-09-06 MED ORDER — ONDANSETRON HCL 4 MG/2ML IJ SOLN: 4.0000 mg | Freq: Once | INTRAMUSCULAR | Status: DC | PRN

## 2022-09-06 MED ORDER — ALBUTEROL SULFATE HFA 108 (90 BASE) MCG/ACT IN AERS
INHALATION_SPRAY | RESPIRATORY_TRACT | Status: DC | PRN
  Administered 2022-09-06: 4 via RESPIRATORY_TRACT

## 2022-09-06 MED ORDER — MIDAZOLAM HCL 2 MG/2ML IJ SOLN
INTRAMUSCULAR | Status: DC | PRN
  Administered 2022-09-06: 2 mg via INTRAVENOUS

## 2022-09-06 MED ORDER — ACETAMINOPHEN 10 MG/ML IV SOLN: 1000.0000 mg | Freq: Once | INTRAVENOUS | Status: DC | PRN

## 2022-09-06 MED ORDER — CEFAZOLIN SODIUM 1 G IJ SOLR
INTRAMUSCULAR | Status: AC
  Filled 2022-09-06: qty 10

## 2022-09-06 MED ORDER — SUGAMMADEX SODIUM 500 MG/5ML IV SOLN
INTRAVENOUS | Status: DC | PRN
  Administered 2022-09-06: 400 mg via INTRAVENOUS

## 2022-09-06 MED ORDER — VASOPRESSIN 20 UNIT/ML IV SOLN
INTRAVENOUS | Status: AC
  Filled 2022-09-06: qty 1

## 2022-09-06 MED ORDER — OXYCODONE HCL 5 MG/5ML PO SOLN: 5.0000 mg | Freq: Once | ORAL | Status: AC | PRN

## 2022-09-06 MED ORDER — VASOPRESSIN 20 UNIT/ML IV SOLN
INTRAVENOUS | Status: DC | PRN
  Administered 2022-09-06 (×2): 1 [IU] via INTRAVENOUS

## 2022-09-06 MED ORDER — EPHEDRINE SULFATE (PRESSORS) 50 MG/ML IJ SOLN
INTRAMUSCULAR | Status: DC | PRN
  Administered 2022-09-06 (×2): 10 mg via INTRAVENOUS

## 2022-09-06 MED ORDER — FENTANYL CITRATE (PF) 100 MCG/2ML IJ SOLN
INTRAMUSCULAR | Status: AC
  Filled 2022-09-06: qty 2

## 2022-09-06 MED ORDER — PHENYLEPHRINE HCL (PRESSORS) 10 MG/ML IV SOLN
INTRAVENOUS | Status: DC | PRN
  Administered 2022-09-06: 80 ug via INTRAVENOUS
  Administered 2022-09-06 (×2): 160 ug via INTRAVENOUS
  Administered 2022-09-06: 80 ug via INTRAVENOUS

## 2022-09-06 MED ORDER — MIDAZOLAM HCL 2 MG/2ML IJ SOLN
INTRAMUSCULAR | Status: AC
  Filled 2022-09-06: qty 2

## 2022-09-06 MED ORDER — CHLORHEXIDINE GLUCONATE 0.12 % MT SOLN
OROMUCOSAL | Status: AC
  Filled 2022-09-06: qty 15

## 2022-09-06 MED ORDER — OXYCODONE HCL 5 MG PO TABS
ORAL_TABLET | ORAL | Status: AC
  Filled 2022-09-06: qty 1

## 2022-09-06 MED ORDER — STERILE WATER FOR IRRIGATION IR SOLN
Status: DC | PRN
  Administered 2022-09-06: 3000 mL via INTRAVESICAL

## 2022-09-06 MED ORDER — ONDANSETRON HCL 4 MG/2ML IJ SOLN
INTRAMUSCULAR | Status: DC | PRN
  Administered 2022-09-06: 4 mg via INTRAVENOUS

## 2022-09-06 MED ORDER — CEFAZOLIN SODIUM-DEXTROSE 2-4 GM/100ML-% IV SOLN
INTRAVENOUS | Status: AC
  Filled 2022-09-06: qty 100

## 2022-09-06 MED ORDER — NOREPINEPHRINE 4 MG/250ML-% IV SOLN
INTRAVENOUS | Status: AC
  Filled 2022-09-06: qty 250

## 2022-09-06 MED ORDER — DEXAMETHASONE SODIUM PHOSPHATE 10 MG/ML IJ SOLN
INTRAMUSCULAR | Status: DC | PRN
  Administered 2022-09-06: 10 mg via INTRAVENOUS

## 2022-09-06 MED ORDER — ROCURONIUM BROMIDE 100 MG/10ML IV SOLN
INTRAVENOUS | Status: DC | PRN
  Administered 2022-09-06: 30 mg via INTRAVENOUS
  Administered 2022-09-06: 10 mg via INTRAVENOUS

## 2022-09-06 MED ORDER — SUCCINYLCHOLINE CHLORIDE 200 MG/10ML IV SOSY
PREFILLED_SYRINGE | INTRAVENOUS | Status: DC | PRN
  Administered 2022-09-06: 200 mg via INTRAVENOUS

## 2022-09-06 MED ORDER — FENTANYL CITRATE (PF) 100 MCG/2ML IJ SOLN
INTRAMUSCULAR | Status: DC | PRN
  Administered 2022-09-06: 50 ug via INTRAVENOUS

## 2022-09-06 MED ORDER — FENTANYL CITRATE PF 50 MCG/ML IJ SOSY
PREFILLED_SYRINGE | INTRAMUSCULAR | Status: AC
  Filled 2022-09-06: qty 1

## 2022-09-06 MED ORDER — SUCCINYLCHOLINE CHLORIDE 200 MG/10ML IV SOSY
PREFILLED_SYRINGE | INTRAVENOUS | Status: AC
  Filled 2022-09-06: qty 10

## 2022-09-06 MED ORDER — FENTANYL CITRATE PF 50 MCG/ML IJ SOSY
50.0000 ug | PREFILLED_SYRINGE | Freq: Once | INTRAMUSCULAR | Status: AC
  Administered 2022-09-06: 50 ug via INTRAVENOUS

## 2022-09-06 MED ORDER — LIDOCAINE HCL (CARDIAC) PF 100 MG/5ML IV SOSY
PREFILLED_SYRINGE | INTRAVENOUS | Status: DC | PRN
  Administered 2022-09-06: 100 mg via INTRAVENOUS

## 2022-09-06 MED ORDER — PROPOFOL 10 MG/ML IV BOLUS
INTRAVENOUS | Status: AC
  Filled 2022-09-06: qty 20

## 2022-09-06 SURGICAL SUPPLY — 26 items
BAG DRN RND TRDRP ANRFLXCHMBR (UROLOGICAL SUPPLIES) ×1
BAG URINE DRAIN 2000ML AR STRL (UROLOGICAL SUPPLIES) ×1 IMPLANT
BALLN OPTILUME DCB 24X5X75 (BALLOONS) IMPLANT
BALLN OPTILUME DCB 30X3X75 (BALLOONS) IMPLANT
BALLN OPTILUME DCB 30X5X75 (BALLOONS) ×1 IMPLANT
BALLOON OPTILUME DCB 24X5X75 (BALLOONS) IMPLANT
BALLOON OPTILUME DCB 30X3X75 (BALLOONS) IMPLANT
BALLOON OPTILUME DCB 30X5X75 (BALLOONS) IMPLANT
CATH FOL 2WAY LX 16X5 (CATHETERS) ×1 IMPLANT
CATH FOLEY 2W COUNCIL 20FR 5CC (CATHETERS) IMPLANT
CATH FOLEY 2W COUNCIL 5CC 16FR (CATHETERS) IMPLANT
CATH SET URETHRAL DILATOR (CATHETERS) ×1 IMPLANT
DEVICE INFLATION ATRION QL4015 (MISCELLANEOUS) IMPLANT
ELECT REM PT RETURN 9FT ADLT (ELECTROSURGICAL) ×1 IMPLANT
ELECTRODE REM PT RTRN 9FT ADLT (ELECTROSURGICAL) ×1 IMPLANT
GLOVE BIOGEL PI IND STRL 7.5 (GLOVE) ×1 IMPLANT
GOWN STRL REUS W/ TWL XL LVL3 (GOWN DISPOSABLE) ×2 IMPLANT
GOWN STRL REUS W/TWL XL LVL3 (GOWN DISPOSABLE) ×2
GUIDEWIRE STR DUAL SENSOR (WIRE) ×1 IMPLANT
HOLDER FOLEY CATH W/STRAP (MISCELLANEOUS) ×1 IMPLANT
PACK CYSTO AR (MISCELLANEOUS) ×1 IMPLANT
SET CYSTO W/LG BORE CLAMP LF (SET/KITS/TRAYS/PACK) ×1 IMPLANT
SYR 30ML LL (SYRINGE) ×1 IMPLANT
TRAP FLUID SMOKE EVACUATOR (MISCELLANEOUS) ×1 IMPLANT
WATER STERILE IRR 3000ML UROMA (IV SOLUTION) ×1 IMPLANT
WATER STERILE IRR 500ML POUR (IV SOLUTION) ×1 IMPLANT

## 2022-09-06 NOTE — Progress Notes (Signed)
  Chaplain On-Call responded to a page from [employer], who reported the patient's request to speak with a Chaplain.  Chaplain met the patient in Same Day Surgery Unit, bed 13.  Chaplain offered supportive listening as the patient described his family history, which includes several significant figures in [place] history.  The patient also stated that the surgical procedure this morning will be for a cystoscopy. He expressed hope that there can be relief from frequent UTIs which he experiences.  Chaplain provided spiritual and emotional support and prayer.  Chaplain Evelena Peat M.Div., [facility]

## 2022-09-06 NOTE — Op Note (Signed)
   Preoperative diagnosis:  Bulbar urethral stricture  Postoperative diagnosis:  Bulbar urethral stricture  Procedure: Cystoscopy with balloon dilation of bulbar urethral stricture (Optilume DBC)  Surgeon: Riki Altes, MD  Anesthesia: General  Complications: None  Intraoperative findings:  Bulbar urethral stricture measuring ~ 1 cm Prostate nonocclusive Bladder mucosal erythema, solid or papillary lesions  EBL: Minimal  Specimens: None  Indication: Daniel [name] is a 32 y.o. patient a history of bulbar urethral stricture who recently developed obstructive voiding symptoms.  Office cystoscopy remarkable for a recurrent urethral stricture.  After reviewing the management options for treatment, he elected to proceed with the above surgical procedure(s). We have discussed the potential benefits and risks of the procedure, side effects of the proposed treatment, the likelihood of the patient achieving the goals of the procedure, and any potential problems that might occur during the procedure or recuperation. Informed consent has been obtained.  Description of procedure:  The patient was taken to the operating room and general anesthesia was induced.  The patient was placed in the dorsal lithotomy position, prepped and draped in the usual sterile fashion, and preoperative antibiotics were administered. A preoperative time-out was performed.   A 17 French cystoscope with 30 degree lens was lubricated and passed per urethra.  The stricture was identified in the bulbar urethra as described above.  A 0.038 Sensor wire was placed through the cystoscope and advanced through the stricture into the bladder.  The cystoscope was able to be gently advanced over the wire into the bladder.  The guidewire was removed and panendoscopy was performed with findings as described above.  The guidewire was replaced and the cystoscope was backed out to just distal to the stricture.  A 30 F/50 mm  Optilume balloon was advanced over the guidewire and positioned through the stricture.  The balloon was inflated to 10 ATM and kept inflated for 5 minutes.  The balloon was deflated and the catheter was removed.  A 16 [place] [employer] catheter was placed over the guidewire without difficulty and the balloon was inflated with 10 mL of sterile water.  The catheter was placed to gravity drainage.  Plan: Indwelling catheter x 48 hours Condom with intercourse x 1 month   Riki Altes, M.D.

## 2022-09-06 NOTE — Anesthesia Preprocedure Evaluation (Signed)
Anesthesia Evaluation  Patient identified by MRN, date of birth, ID band Patient awake  General Assessment Comment:Last anesthetic notable for persistent hypotension and Afib/RVR requiring administration of phenylephrine, vasopressin, norepinephrine, and esmolol for the rate.  Patient has legal guardian that provides consent  Reviewed: Allergy & Precautions, H&P , NPO status , Patient's Chart, lab work & pertinent test results, reviewed documented beta blocker date and time   History of Anesthesia Complications Negative for: history of anesthetic complications  Airway Mallampati: III  TM Distance: >3 FB Neck ROM: full    Dental  (+) Poor Dentition, Teeth Intact   Pulmonary shortness of breath and with exertion, sleep apnea , former smoker    + decreased breath sounds      Cardiovascular Exercise Tolerance: Poor hypertension, (-) angina +CHF  (-) Past MI and (-) Cardiac Stents + dysrhythmias Atrial Fibrillation (-) Valvular Problems/Murmurs Rhythm:regular Rate:Normal  TTE 2024: 1. Left ventricular ejection fraction, by estimation, is 55 to 60%. The  left ventricle has normal function. The left ventricle has no regional  wall motion abnormalities. The left ventricular internal cavity size was  mildly dilated. Left ventricular  diastolic function could not be evaluated.   2. Right ventricular systolic function is normal. The right ventricular  size is normal. Tricuspid regurgitation signal is inadequate for assessing  PA pressure.   3. The mitral valve is normal in structure. No evidence of mitral valve  regurgitation. No evidence of mitral stenosis.   4. The aortic valve is normal in structure. Aortic valve regurgitation is  not visualized. No aortic stenosis is present.   5. Limited images. Only parasternal images obtained.     Per PAT note: Patient is followed by cardiology Katherine Roan(End/McLean, MD). He was last seen in the cardiology  clinic on 08/05/2022; notes reviewed.  At the time of his clinic visit, patient reporting episodes of atypical chest pain that was felt to be pleuritic in nature.  He also had complaints of chronic exertional dyspnea requiring the use of supplemental oxygen for the last 2 years.  He is jointly managed by pulmonary medicine.  Patient denied any PND, orthopnea, palpitations, significant peripheral edema, vertiginous symptoms, or presyncope/syncope. Patient with a past medical history significant for cardiovascular diagnoses. Documented physical exam was grossly benign, providing no evidence of acute exacerbation and/or decompensation of the patient's known cardiovascular conditions.    Patient reported to have suffered a TIA in the past.  He is unable to provide a date of the event.  Patient has no resulting neurological deficits resulting from reported neurological event.    Patient has a diagnosis of cardiomyopathy.  Cardiac function has been serially monitored since diagnosis.  Most recent TTE was performed on 10/22/2021 revealing mild reduced left ventricular systolic function with an EF of 45-50%.  Cardiology noted that EF was difficult to estimate in the setting of atrial fibrillation with a rate being greater than 100 bpm.  Left ventricle demonstrated global hypokinesis.  Diastolic Doppler parameters were indeterminate.  Right ventricular size and function were normal.  There was no significant valvular regurgitation.  All transvalvular gradients were noted to be normal providing no evidence suggestive of valvular stenosis.   Patient with an atrial fibrillation diagnosis; CHA2DS2-VASc Score = 5 (CHF, HTN, TIA x 2, T2DM). His rate and rhythm are currently being maintained on oral diltiazem + metoprolol succinate. He is chronically anticoagulated using apixaban; reported to be compliant with therapy with no evidence or reports of GI bleeding.  Heart failure  and blood pressure well controlled at 126/66  mmHg on currently prescribed CCB (diltiazem), diuretic (furosemide), beta-blocker (metoprolol succinate) and ARB/ARNI (Entresto therapies. He is on a atorvastatin for his HLD diagnosis and further ASCVD prevention. T2DM well controlled on currently prescribed regimen; last HgbA1c was 5.8% when checked on 08/05/2022.  Patient does have a severe OSAH diagnosis and requires nocturnal PAP therapy; compliant.  Functional capacity limited by morbid obesity, supplemental oxygen use, and patient's multiple medical comorbidities.  With that being said, patient still felt to be able to achieve at least 4 METS of physical activity without experiencing any degree of angina/anginal equivalent symptoms.  Given patient's ongoing shortness of breath, advanced heart failure provider with plans for repeat TTE and diagnostic RIGHT cardiac catheterization.  Patient to follow-up with outpatient cardiology following these procedures.   Since patient was last seen by cardiology/heart failure clinic, patient has partially undergone the requested cardiovascular testing.  RIGHT cardiac catheterization pending scheduling.  Echocardiogram was performed as follows.    TTE was performed on 10/22/2021 revealing a normal left ventricular systolic function with an EF of 55 to 60%.  There were no regional wall motion abnormalities.  Left ventricular internal cavity size was mildly dilated.  Diastolic Doppler parameters indeterminate.  Right ventricular size and function normal.  All transvalvular gradients were noted to be normal providing no evidence suggestive of valvular stenosis.       Per cardiology, "based ACC/AHA guidelines, the patient's past medical history, and the amount of time since his last clinic visit, this patient would be at an overall ACCEPTABLE risk for the planned procedure without further cardiovascular testing or intervention at this time".     Neuro/Psych  PSYCHIATRIC DISORDERS Anxiety  Bipolar Disorder  Schizophrenia  TIA   GI/Hepatic Neg liver ROS,GERD  Medicated and Controlled,,  Endo/Other  diabetes  Morbid obesity  Renal/GU negative Renal ROS  negative genitourinary   Musculoskeletal   Abdominal  (+) + obese  Peds  Hematology negative hematology ROS (+)   Anesthesia Other Findings Past Medical History: No date: Atrial fibrillation (HCC) No date: Current use of long term anticoagulation No date: Diabetes mellitus type 2, uncomplicated (HCC) No date: Hyperlipidemia No date: Hypertension No date: OSA (obstructive sleep apnea) No date: TIA (transient ischemic attack)   Reproductive/Obstetrics negative OB ROS                              Anesthesia Physical Anesthesia Plan  ASA: 3  Anesthesia Plan: General   Post-op Pain Management: Ofirmev IV (intra-op)*   Induction: Intravenous and Rapid sequence  PONV Risk Score and Plan: 3 and Ondansetron, Dexamethasone, Midazolam and Treatment may vary due to age or medical condition  Airway Management Planned: Oral ETT and Video Laryngoscope Planned  Additional Equipment: None  Intra-op Plan:   Post-operative Plan: Extubation in OR  Informed Consent: I have reviewed the patients History and Physical, chart, labs and discussed the procedure including the risks, benefits and alternatives for the proposed anesthesia with the patient or authorized representative who has indicated his/her understanding and acceptance.     Dental Advisory Given and Consent reviewed with POA  Plan Discussed with: Anesthesiologist, CRNA and Surgeon  Anesthesia Plan Comments: (Discussed risks of anesthesia with patient, including PONV, sore throat, lip/dental/eye damage. Rare risks discussed as well, such as cardiorespiratory and neurological sequelae, and allergic reactions. Discussed the role of CRNA in patient's perioperative care. Patient understands. I also  spoke with the patient's legal guardian by phone , and  reiterated the above things. Guardian provided consent and agreed.)         Anesthesia Quick Evaluation

## 2022-09-06 NOTE — Interval H&P Note (Signed)
History and Physical Interval Note:  CV:RRR Lungs:clear  09/06/2022 11:22 AM  Daniel [name]  has presented today for surgery, with the diagnosis of Urethral Stricture.  The various methods of treatment have been discussed with the patient and family. After consideration of risks, benefits and other options for treatment, the patient has consented to  Procedure(s): CYSTOSCOPY WITH URETHRAL BALLOON DILATATION USING OPTILUME (N/A) as a surgical intervention.  The patient's history has been reviewed, patient examined, no change in status, stable for surgery.  I have reviewed the patient's chart and labs.  Questions were answered to the patient's satisfaction.     Klover Priestly C Verl Kitson

## 2022-09-06 NOTE — Transfer of Care (Signed)
Immediate Anesthesia Transfer of Care Note  Patient: Daniel [name]  Procedure(s) Performed: CYSTOSCOPY WITH URETHRAL BALLOON DILATATION USING OPTILUME  Patient Location: PACU  Anesthesia Type:General  Level of Consciousness: awake and alert   Airway & Oxygen Therapy: Patient Spontanous Breathing and Patient connected to face mask oxygen  Post-op Assessment: Report given to RN and Post -op Vital signs reviewed and stable  Post vital signs: Reviewed and stable  Last Vitals:  Vitals Value Taken Time  BP 127/49 09/06/22 1236  Temp    Pulse 74 09/06/22 1240  Resp 14 09/06/22 1240  SpO2 100 % 09/06/22 1240  Vitals shown include unvalidated device data.  Last Pain:  Vitals:   09/06/22 0910  TempSrc: Temporal  PainSc: 0-No pain         Complications: No notable events documented.

## 2022-09-06 NOTE — Anesthesia Procedure Notes (Addendum)
Procedure Name: Intubation Date/Time: 09/06/2022 11:55 AM  Performed by: Caroline Matters, [place], CRNAPre-anesthesia Checklist: Patient identified, Patient being monitored, Timeout performed, Emergency Drugs available and Suction available Patient Re-evaluated:Patient Re-evaluated prior to induction Oxygen Delivery Method: Circle system utilized Preoxygenation: Pre-oxygenation with 100% oxygen Induction Type: IV induction Ventilation: Mask ventilation without difficulty Laryngoscope Size: McGraph and 4 Grade View: Grade I Tube type: Oral Tube size: 8.0 mm Number of attempts: 1 Airway Equipment and Method: Stylet Placement Confirmation: ETT inserted through vocal cords under direct vision, positive ETCO2 and breath sounds checked- equal and bilateral Secured at: 26 cm Tube secured with: Tape Dental Injury: Teeth and Oropharynx as per pre-operative assessment

## 2022-09-06 NOTE — Discharge Instructions (Addendum)
AMBULATORY SURGERY  DISCHARGE INSTRUCTIONS   The drugs that you were given will stay in your system until tomorrow so for the next 24 hours you should not:  Drive an automobile Make any legal decisions Drink any alcoholic beverage   You may resume regular meals tomorrow.  Today it is better to start with liquids and gradually work up to solid foods.  You may eat anything you prefer, but it is better to start with liquids, then soup and crackers, and gradually work up to solid foods.   Please notify your doctor immediately if you have any unusual bleeding, trouble breathing, redness and pain at the surgery site, drainage, fever, or pain not relieved by medication.    Additional Instructions:  Please contact your physician with any problems or Same Day Surgery at [phone], Monday through Friday 6 am to 4 pm, or [place] at [facility] number at [phone].  Cystoscopy patient instructions  Following a cystoscopy, a catheter (a flexible rubber tube) is sometimes left in place to empty the bladder. This may cause some discomfort or a feeling that you need to urinate. Your doctor determines the period of time that the catheter will be left in place. You may have bloody urine for two to three days (Call your doctor if the amount of bleeding increases or does not subside).  You may pass blood clots in your urine, especially if you had a biopsy. It is not unusual to pass small blood clots and have some bloody urine a couple of weeks after your cystoscopy. Again, call your doctor if the bleeding does not subside. You may have: Dysuria (painful urination) Frequency (urinating often) Urgency (strong desire to urinate)  These symptoms are common especially if medicine is instilled into the bladder or a ureteral stent is placed. Avoiding alcohol and caffeine, such as coffee, tea, and chocolate, may help relieve these symptoms. Drink plenty of water, unless otherwise instructed. Your  doctor may also prescribe an antibiotic or other medicine to reduce these symptoms.     If you are going home with a catheter in place do not take a tub bath until removed by your doctor.   You may resume your normal activities.  Due to medicated balloon you will need to wear a condom with intercourse for 1 month  Do not drive or operate machinery if you are taking narcotic pain medicine.   Be sure to keep all follow-up appointments with your doctor.   Call Your Doctor If: The catheter is not draining You have severe pain You are unable to urinate You have a fever over 101 You have severe bleeding

## 2022-09-07 ENCOUNTER — Encounter: Payer: Self-pay | Admitting: Urology

## 2022-09-07 NOTE — Anesthesia Postprocedure Evaluation (Signed)
Anesthesia Post Note  Patient: Daniel [name]  Procedure(s) Performed: CYSTOSCOPY WITH URETHRAL BALLOON DILATATION USING OPTILUME  Patient location during evaluation: PACU Anesthesia Type: General Level of consciousness: awake and alert Pain management: pain level controlled Vital Signs Assessment: post-procedure vital signs reviewed and stable Respiratory status: spontaneous breathing, nonlabored ventilation, respiratory function stable and patient connected to nasal cannula oxygen Cardiovascular status: blood pressure returned to baseline and stable Postop Assessment: no apparent nausea or vomiting Anesthetic complications: no   No notable events documented.   Last Vitals:  Vitals:   09/06/22 1319 09/06/22 1437  BP: (!) 121/56 (!) 122/51  Pulse: 67 64  Resp: 16 16  Temp: (!) 36.2 C   SpO2: 100% 100%    Last Pain:  Vitals:   09/06/22 1319  TempSrc: Temporal  PainSc: 3                  Corinda Gubler

## 2022-09-08 ENCOUNTER — Ambulatory Visit (INDEPENDENT_AMBULATORY_CARE_PROVIDER_SITE_OTHER): Payer: Medicaid Other | Admitting: Physician Assistant

## 2022-09-08 VITALS — BP 122/71 | HR 72

## 2022-09-08 DIAGNOSIS — N35812 Other urethral bulbous stricture, male: Secondary | ICD-10-CM

## 2022-09-08 NOTE — Progress Notes (Signed)
Catheter Removal  Patient is present today for a catheter removal. 61ml of water was drained from the balloon. A 16FR foley cath was removed from the bladder, no complications were noted. Patient tolerated well.  Performed by: Debbe Bales, CMA  Follow up/ Additional notes: RTC in 6wks as scheduled.

## 2022-09-08 NOTE — Telephone Encounter (Signed)
Closing encounter since patient has already had procedure and clearance no longer needed

## 2022-09-12 ENCOUNTER — Other Ambulatory Visit: Payer: Self-pay | Admitting: Cardiology

## 2022-09-12 ENCOUNTER — Telehealth: Payer: Self-pay

## 2022-09-12 DIAGNOSIS — I509 Heart failure, unspecified: Secondary | ICD-10-CM

## 2022-09-12 NOTE — Telephone Encounter (Addendum)
Spoke with caregiver Sela Hilding regarding RHC procedure.  Pt is schedule for RHC at [facility] @0730 . Pt was advised to arrive at @0630 .  Advised to hold  Eliquis the day before the procedure. Hold Lasix, Farxiga, hydroxyzine, Flomax the day of the procedure.  Do not eat or drink anything the midnight prior to the procedure.  Pt to come for lab work on the 4/29 pre-procedure lab work at [facility].  Pt caregiver OJ Manson Passey aware, agreeable, and verbalized understanding

## 2022-09-13 ENCOUNTER — Other Ambulatory Visit: Payer: Self-pay | Admitting: Cardiology

## 2022-09-14 ENCOUNTER — Ambulatory Visit (INDEPENDENT_AMBULATORY_CARE_PROVIDER_SITE_OTHER): Payer: Medicaid Other | Admitting: Primary Care

## 2022-09-14 ENCOUNTER — Other Ambulatory Visit
Admission: RE | Admit: 2022-09-14 | Discharge: 2022-09-14 | Disposition: A | Discharge status: Home / Self Care | Payer: Medicaid Other | Source: Ambulatory Visit | Attending: Primary Care | Admitting: Primary Care

## 2022-09-14 ENCOUNTER — Encounter: Payer: Self-pay | Admitting: Primary Care

## 2022-09-14 VITALS — BP 124/58 | HR 86 | Temp 97.9°F | Ht 77.0 in | Wt 369.8 lb

## 2022-09-14 DIAGNOSIS — I429 Cardiomyopathy, unspecified: Secondary | ICD-10-CM | POA: Diagnosis present

## 2022-09-14 DIAGNOSIS — G4733 Obstructive sleep apnea (adult) (pediatric): Secondary | ICD-10-CM | POA: Diagnosis not present

## 2022-09-14 DIAGNOSIS — J9611 Chronic respiratory failure with hypoxia: Secondary | ICD-10-CM | POA: Diagnosis not present

## 2022-09-14 DIAGNOSIS — G4730 Sleep apnea, unspecified: Secondary | ICD-10-CM | POA: Diagnosis not present

## 2022-09-14 LAB — COMPREHENSIVE METABOLIC PANEL
ALT: 19 U/L (ref 0–44)
AST: 18 U/L (ref 15–41)
Albumin: 4.2 g/dL (ref 3.5–5.0)
Alkaline Phosphatase: 82 U/L (ref 38–126)
Anion gap: 8 (ref 5–15)
BUN: 11 mg/dL (ref 6–20)
CO2: 30 mmol/L (ref 22–32)
Calcium: 9.3 mg/dL (ref 8.9–10.3)
Chloride: 99 mmol/L (ref 98–111)
Creatinine, Ser: 0.81 mg/dL (ref 0.61–1.24)
GFR, Estimated: >60 mL/min (ref >60)
Glucose, Bld: 105 mg/dL — ABNORMAL HIGH (ref 70–99)
Potassium: 3.9 mmol/L (ref 3.5–5.1)
Sodium: 137 mmol/L (ref 135–145)
Total Bilirubin: 0.7 mg/dL (ref 0.3–1.2)
Total Protein: 7.5 g/dL (ref 6.5–8.1)

## 2022-09-14 LAB — BRAIN NATRIURETIC PEPTIDE: B Natriuretic Peptide: 90.8 pg/mL (ref 0.0–100.0)

## 2022-09-14 NOTE — Progress Notes (Signed)
@Patient  ID: Daniel [name], male    DOB: [birth year], 32 y.o.   MRN: [id number]  Chief Complaint  Patient presents with   Follow-up    Sleep study 06/2021-not setup on bipap.     Referring provider: Anselm Jungling, NP  HPI:  32 year old male, former smoker.  Past medical history significant for afib, HTN, cardiomyopathy, OSA, chronic respiratory failure, type 2 diabetes, schizoaffective/bipolar type, morbid obesity.   03/23/2022 Patient presents today for sleep consult. Accompanied by care taker, he lives in group home. Referred by cardiology d/t afib. He continues to have frequent symptomatic atrial fibrillation despite weight loss. He is on metoprolol and diltiazem. He wears 4L oxygen 24/7 since he was hospitalized last year for covid. He has symptoms of snoring, choking/waking up gasping for air at night, restless sleep and daytime sleepiness. He stays tired all day. Typical bedtime is 8pm. It does not take him long to fall asleep. He wakes up 3-4 times a night. He starts his day between 7:30-8:30am. Denies narcolepsy, cataplexy, sleep walking.    Sleep questionnaire Symptoms-  snoring, choking and stops breathing at night, restless sleep, daytime sleepiness   Prior sleep study- none Bedtime-8pm Time to fall asleep- 10 mins  Nocturnal awakenings- 3-4 times Out of bed/start of day-  7:30am-8am  Weight changes- no Do you operate heavy machinery- no Do you currently wear CPAP- no Do you current wear oxygen- yes  Epworth- 11   09/14/2022- Interim hx  Patient presents today for BIPAP follow-up. He has symptoms of snoring and witnessed apnea. Not has tired. He has lost weight. He is exercising/walking. Split night sleep study showed severe OSA, AHI 143/hour with severe oxygen desaturations to 63%. Corrected with BIPAP 20/20cm h20 with large full facemask. Recommended he be started on auto bilevel Min EPAP 12/ Max IPAP 24 with PS 4. We reviewed sleep study results and treatment  options, patient is alright with proceeding with BIPAP. He uses oxygen at night and as needed during daytime. He was walked in office today and was able to maintain O2 level >92% on room air. He gets oxygen through [employer] and would like to stay with them. He has chronic lower extremity swelling, a little worse than normal. He takes lasix 20mg  daily. No shortness of breath.    Allergies  Allergen Reactions   Haldol [Haloperidol] Other (See Comments)    SI   Dermatitis Antigen Rash   Meperidine     Meperidine   Abilify [Aripiprazole] Palpitations   Demerol [Meperidine Hcl] Hives   Tape Rash    Other reaction(s): Other (See Comments) Itching, skin tear    Immunization History  Administered Date(s) Administered   Hepatitis A, Ped/Adol-2 Dose 12/22/2005, 07/31/2006   Hepatitis B, PED/ADOLESCENT 01/23/2002, 02/26/2002, 07/23/2002   Influenza Split 05/30/2015   Influenza, High Dose Seasonal PF 04/06/2006, 03/09/2009, 02/16/2020, 04/14/2020   Influenza,inj,Quad PF,6+ Mos 04/05/2006, 03/09/2009, 04/14/2020   PFIZER Comirnaty(Gray Top)Covid-19 Tri-Sucrose Vaccine 09/30/2020   PFIZER(Purple Top)SARS-COV-2 Vaccination 07/12/2019, 08/08/2019, 04/28/2020   Td (Adult), 2 Lf Tetanus Toxid, Preservative Free 06/14/2002, 04/30/2009   Tdap 02/07/2022   Varicella 12/22/2005, 04/06/2006    Past Medical History:  Diagnosis Date   Cardiomyopathy    a.) TTE 08/04/2021: EF 50%; b.) TTE 10/22/2021: EF 45-50-%; c.) TTE 08/22/2022: EF 55-60%   Chest pain    CHF (congestive heart failure)    a.) TTE 08/04/2021: EF 50%, mild LVH, RVSF norm; b.) TTE 10/22/2021: EF 45-50%, glob HK, RVSF norm; c.) TTE 08/22/2022: EF  55-60%, mild dil LV, RVSF norm.   Conversion disorder    Current use of long term anticoagulation    Dyspnea    GERD (gastroesophageal reflux disease)    Hepatic steatosis    Hyperlipidemia    Hypertension    Insomnia    a.) melatonin PRN   Long term current use of anticoagulant    a.)  apixaban   OSA on CPAP    Persistent atrial fibrillation and flutter (HCC)    a.) CHA2DS2VASc = 5 (CHF, HTN, TIA x 2, T2DM);  b.) rate/rhythm maintained on oral diltiazem + metoprolol succinate; chronically anticoagulated with apixaban   Schizoaffective disorder    Seizure disorder    T2DM (type 2 diabetes mellitus)    TIA (transient ischemic attack)    Urethral stricture    a. 08/2021 s/p cystoscopy and urethral dilation.    Tobacco History: Social History   Tobacco Use  Smoking Status Former   Years: 2   Types: Cigarettes   Quit date: 08/2021   Years since quitting: 1.0  Smokeless Tobacco Never   Counseling given: Not Answered   Outpatient Medications Prior to Visit  Medication Sig Dispense Refill   acetaminophen (TYLENOL) 650 MG CR tablet Take 1,300 mg by mouth every 8 (eight) hours as needed for pain.     albuterol (VENTOLIN HFA) 108 (90 Base) MCG/ACT inhaler Inhale 2 puffs into the lungs every 6 (six) hours as needed for wheezing. 1 each 2   allopurinol (ZYLOPRIM) 300 MG tablet Take 1 tablet (300 mg total) by mouth daily. 30 tablet 0   atorvastatin (LIPITOR) 80 MG tablet Take 80 mg by mouth daily.     baclofen (LIORESAL) 10 MG tablet Take 1 tablet (10 mg total) by mouth 2 (two) times daily as needed for muscle spasms. Home med. (Patient taking differently: Take 10 mg by mouth 2 (two) times daily. Home med.) 30 each 0   benztropine (COGENTIN) 1 MG tablet Take 1 mg by mouth daily.     cyclobenzaprine (FLEXERIL) 5 MG tablet Take 5 mg by mouth 3 (three) times daily as needed for muscle spasms.     diltiazem (CARDIZEM CD) 360 MG 24 hr capsule Take 1 capsule (360 mg total) by mouth daily. 30 capsule 1   divalproex (DEPAKOTE) 500 MG DR tablet Take 2 tablets (1,000 mg total) by mouth 2 (two) times daily. (Patient taking differently: Take 1,000 mg by mouth daily at 6 (six) AM.)     ELIQUIS 5 MG TABS tablet Take 5 mg by mouth 2 (two) times daily.     escitalopram (LEXAPRO) 10 MG  tablet Take 20 mg by mouth daily.     FARXIGA 10 MG TABS tablet TAKE 1 TABLET BY MOUTH ONCE DAILY BEFORE BREAKFAST 28 tablet 10   fluticasone (FLONASE) 50 MCG/ACT nasal spray Place 2 sprays into both nostrils 2 (two) times daily.     furosemide (LASIX) 20 MG tablet Take 1 tablet (20 mg total) by mouth daily. 30 tablet 0   guaiFENesin (MUCINEX) 600 MG 12 hr tablet Take 1 tablet (600 mg total) by mouth 2 (two) times daily as needed. 60 tablet 1   hydrOXYzine (ATARAX) 25 MG tablet Take 25 mg by mouth daily. At noon     hydrOXYzine (ATARAX) 50 MG tablet Take 50 mg by mouth in the morning and at bedtime.     INVEGA 9 MG 24 hr tablet Take 9 mg by mouth every morning.     lithium  carbonate (LITHOBID) 300 MG CR tablet Take 600 mg by mouth every evening.     loratadine (CLARITIN) 10 MG tablet Take 10 mg by mouth daily.     melatonin 3 MG TABS tablet Take 3 mg by mouth at bedtime.     metoprolol succinate (TOPROL XL) 100 MG 24 hr tablet Take 1.5 tablets (150 mg total) by mouth daily. Take with or immediately following a meal. 45 tablet 3   naproxen (NAPROSYN) 500 MG tablet Take 1 tablet (500 mg total) by mouth 2 (two) times daily with a meal. 20 tablet 2   omeprazole (PRILOSEC) 20 MG capsule Take 20 mg by mouth daily.     sacubitril-valsartan (ENTRESTO) 24-26 MG Take 1 tablet by mouth 2 (two) times daily. 60 tablet 5   tamsulosin (FLOMAX) 0.4 MG CAPS capsule Take 1 capsule (0.4 mg total) by mouth daily. 30 capsule 2   traMADol (ULTRAM) 50 MG tablet Take 50 mg by mouth every 6 (six) hours as needed.     colchicine 0.6 MG tablet Take 1 tablet (0.6 mg total) by mouth daily for 3 days. 3 tablet 0   No facility-administered medications prior to visit.   Review of Systems  Review of Systems  Constitutional: Negative.  Negative for fatigue.  HENT: Negative.    Respiratory:  Negative for shortness of breath.   Cardiovascular:  Positive for leg swelling. Negative for chest pain.   Physical Exam  BP (!)  124/58 (BP Location: Left Arm, Cuff Size: Large)   Pulse 86   Temp 97.9 F (36.6 C) (Temporal)   Ht  (1.956 m)   Wt (!) 369 lb 12.8 oz (167.7 kg)   SpO2 92%   BMI 43.85 kg/m  Physical Exam Constitutional:      General: He is not in acute distress.    Appearance: Normal appearance. He is obese. He is not ill-appearing.  HENT:     Head: Normocephalic and atraumatic.  Cardiovascular:     Rate and Rhythm: Normal rate and regular rhythm.     Comments: +3 BLE edema, pitting  Pulmonary:     Effort: Pulmonary effort is normal.     Breath sounds: Normal breath sounds.  Skin:    General: Skin is warm and dry.  Neurological:     General: No focal deficit present.     Mental Status: He is alert and oriented to person, place, and time. Mental status is at baseline.  Psychiatric:        Mood and Affect: Mood normal.        Behavior: Behavior normal.        Thought Content: Thought content normal.        Judgment: Judgment normal.      Lab Results:  CBC    Component Value Date/Time   WBC 7.3 08/21/2022 2129   RBC 4.68 08/21/2022 2129   HGB 13.1 08/21/2022 2129   HGB 11.9 (L) 04/29/2013 0838   HCT 41.8 08/21/2022 2129   HCT 36.0 (L) 04/29/2013 0838   PLT 246 08/21/2022 2129   PLT 257 04/29/2013 0838   MCV 89.3 08/21/2022 2129   MCV 80 04/29/2013 0838   MCH 28.0 08/21/2022 2129   MCHC 31.3 08/21/2022 2129   RDW 13.9 08/21/2022 2129   RDW 14.1 04/29/2013 0838   LYMPHSABS 1.4 05/22/2022 1050   MONOABS 0.7 05/22/2022 1050   EOSABS 0.1 05/22/2022 1050   BASOSABS 0.0 05/22/2022 1050    BMET  Component Value Date/Time   NA 137 09/14/2022 1107   NA 136 04/29/2013 0838   K 3.9 09/14/2022 1107   K 4.0 04/29/2013 0838   CL 99 09/14/2022 1107   CL 106 04/29/2013 0838   CO2 30 09/14/2022 1107   CO2 25 04/29/2013 0838   GLUCOSE 105 (H) 09/14/2022 1107   GLUCOSE 105 (H) 04/29/2013 0838   BUN 11 09/14/2022 1107   BUN 15 04/29/2013 0838   CREATININE 0.81 09/14/2022  1107   CREATININE 0.99 04/29/2013 0838   CALCIUM 9.3 09/14/2022 1107   CALCIUM 9.2 04/29/2013 0838   GFRNONAA >60 09/14/2022 1107   GFRNONAA >60 04/29/2013 0838   GFRAA >60 04/29/2013 0838    BNP    Component Value Date/Time   BNP 90.8 09/14/2022 1107    ProBNP No results found for: "PROBNP"  Imaging: ECHOCARDIOGRAM COMPLETE  Result Date: 08/22/2022    ECHOCARDIOGRAM REPORT   Patient Name:   HUMZAH HARTY Date of Exam: 08/22/2022 Medical Rec #:  [id number]        Height:       70.0 in Accession #:    [id number]       Weight:       375.0 lb Date of Birth:  28-Nov-1990        BSA:          2.727 m Patient Age:    31 years         BP:           135/64 mmHg Patient Gender: M                HR:           80 bpm. Exam Location:  [facility] Procedure: 2D Echo, Cardiac Doppler and Color Doppler Indications:     CHF I50.9  History:         Patient has prior history of Echocardiogram examinations, most                  recent 10/22/2021. Cardiomyopathy, Arrythmias:Atrial                  Fibrillation and Atrial Flutter; Risk Factors:Hypertension and                  Diabetes.  Sonographer:     JERRY Referring Phys:  [id number] Laurey Morale Diagnosing Phys: Lorine Bears MD  Sonographer Comments: Technically challenging study due to limited acoustic windows, no apical window and no subcostal window. IMPRESSIONS  1. Left ventricular ejection fraction, by estimation, is 55 to 60%. The left ventricle has normal function. The left ventricle has no regional wall motion abnormalities. The left ventricular internal cavity size was mildly dilated. Left ventricular diastolic function could not be evaluated.  2. Right ventricular systolic function is normal. The right ventricular size is normal. Tricuspid regurgitation signal is inadequate for assessing PA pressure.  3. The mitral valve is normal in structure. No evidence of mitral valve regurgitation. No evidence of mitral stenosis.  4. The aortic valve is normal in  structure. Aortic valve regurgitation is not visualized. No aortic stenosis is present.  5. Limited images. Only parasternal images obtained. FINDINGS  Left Ventricle: Left ventricular ejection fraction, by estimation, is 55 to 60%. The left ventricle has normal function. The left ventricle has no regional wall motion abnormalities. The left ventricular internal cavity size was mildly dilated. There is  no left ventricular hypertrophy. Left ventricular diastolic function could not  be evaluated. Right Ventricle: The right ventricular size is normal. No increase in right ventricular wall thickness. Right ventricular systolic function is normal. Tricuspid regurgitation signal is inadequate for assessing PA pressure. Left Atrium: Left atrial size was normal in size. Right Atrium: Right atrial size was normal in size. Pericardium: There is no evidence of pericardial effusion. Mitral Valve: The mitral valve is normal in structure. No evidence of mitral valve regurgitation. No evidence of mitral valve stenosis. Tricuspid Valve: The tricuspid valve is normal in structure. Tricuspid valve regurgitation is trivial. No evidence of tricuspid stenosis. Aortic Valve: The aortic valve is normal in structure. Aortic valve regurgitation is not visualized. No aortic stenosis is present. Pulmonic Valve: The pulmonic valve was normal in structure. Pulmonic valve regurgitation is mild. No evidence of pulmonic stenosis. Aorta: The aortic root is normal in size and structure. Venous: The inferior vena cava was not well visualized. IAS/Shunts: No atrial level shunt detected by color flow Doppler.  LEFT VENTRICLE PLAX 2D LVIDd:         5.30 cm LVIDs:         3.30 cm LV PW:         1.10 cm LV IVS:        0.90 cm LVOT diam:     2.30 cm LVOT Area:     4.15 cm  LEFT ATRIUM         Index LA diam:    4.10 cm 1.50 cm/m   AORTA Ao Root diam: 3.10 cm TRICUSPID VALVE TR Peak grad:   15.5 mmHg TR Vmax:        197.00 cm/s  SHUNTS Systemic Diam: 2.30  cm Lorine Bears MD Electronically signed by Lorine Bears MD Signature Date/Time: 08/22/2022/11:28:03 AM    Final    CT Head Wo Contrast  Result Date: 08/21/2022 CLINICAL DATA:  fall eliquis EXAM: CT HEAD WITHOUT CONTRAST TECHNIQUE: Contiguous axial images were obtained from the base of the skull through the vertex without intravenous contrast. RADIATION DOSE REDUCTION: This exam was performed according to the departmental dose-optimization program which includes automated exposure control, adjustment of the mA and/or kV according to patient size and/or use of iterative reconstruction technique. COMPARISON:  CT head 05/22/2022 FINDINGS: Brain: No evidence of large-territorial acute infarction. No parenchymal hemorrhage. No mass lesion. No extra-axial collection. No mass effect or midline shift. No hydrocephalus. Basilar cisterns are patent. Vascular: No hyperdense vessel. Skull: No acute fracture or focal lesion. Sinuses/Orbits: Paranasal sinuses and mastoid air cells are clear. The orbits are unremarkable. Other: None. IMPRESSION: No acute intracranial abnormality. Electronically Signed   By: Tish Frederickson M.D.   On: 08/21/2022 22:51   DG Hip Unilat W or Wo Pelvis 2-3 Views Left  Result Date: 08/21/2022 CLINICAL DATA:  Left hip pain after a fall. EXAM: DG HIP (WITH OR WITHOUT PELVIS) 2-3V LEFT COMPARISON:  None Available. FINDINGS: Pelvis and left hip appear intact. No evidence of acute fracture or dislocation. No focal bone lesion or bone destruction. Sacrum appears intact. SI joints and symphysis pubis are not displaced. IMPRESSION: No acute bony abnormalities. Electronically Signed   By: Burman Nieves M.D.   On: 08/21/2022 22:14   DG Chest 2 View  Result Date: 08/21/2022 CLINICAL DATA:  Chest pain after a fall.  Dizziness. EXAM: CHEST - 2 VIEW COMPARISON:  08/05/2022 FINDINGS: Heart size and pulmonary vascularity are normal for technique. Lungs are clear. No pleural effusions. No pneumothorax.  Mediastinal contours appear intact. IMPRESSION: No active  cardiopulmonary disease. Electronically Signed   By: Burman Nieves M.D.   On: 08/21/2022 22:13     Assessment & Plan:   Severe sleep apnea - Split night sleep study showed severe OSA, AHI 143/hour with severe oxygen desaturations to 63%. Corrected with BIPAP 20/20cm h20 with large full facemask. We reviewed sleep study results and treatment options. He is agreeing to proceed with BIPAP for treatment of OSA. Order placed auto bilevel Min EPAP 12/ Max IPAP 24 with PS 4. Advised patient aim to wear BIPAP nightly for 4-6 hours. Continue weight loss efforts. Follow-up in 6-8 weeks for compliance check.   Chronic hypoxemic respiratory failure (HCC) - Patient has been on oxygen since he was hospitalized for Covid in 2022 - He was walked in office today off oxygen and O2 stayed >92% RA  - Advised patient continue to wear 3L oxygen at night with BIPAP, we will check ONO on BIPAP/room air to determine if oxygen can be discontinued   Cardiomyopathy [facility]) - Patient has +3 BLE pitting edema, no dyspnea  - Checking BNP and CMET - Continue lasix 20mg  daily    Glenford Bayley, NP 09/14/2022

## 2022-09-14 NOTE — Progress Notes (Signed)
His labs (cmet and bnp) were normal

## 2022-09-14 NOTE — Assessment & Plan Note (Addendum)
-   Split night sleep study showed severe OSA, AHI 143/hour with severe oxygen desaturations to 63%. Corrected with BIPAP 20/20cm h20 with large full facemask. We reviewed sleep study results and treatment options. He is agreeing to proceed with BIPAP for treatment of OSA. Order placed auto bilevel Min EPAP 12/ Max IPAP 24 with PS 4. Advised patient aim to wear BIPAP nightly for 4-6 hours. Continue weight loss efforts. Follow-up in 6-8 weeks for compliance check.

## 2022-09-14 NOTE — Patient Instructions (Addendum)
Need to wear 3L of oxygen at night with BiPAP We will check an overnight oximetry test on BIPAP without oxygen You were able to maintain O2 level >90% during walk test, you do not need to wear oxygen during daytime unless needed (goal keep O2 > 88-90%)   Orders: Ambulatory walk test on RA (done) auto bilevel Min EPAP 12/ Max IPAP 24 with PS 4.  ONO on BIPAP/Room air (ordered)  Labs (CMET/BNP- ordered)   Follow-up: 6-8 weeks with [name] NP for BIPAP compliance check

## 2022-09-14 NOTE — Assessment & Plan Note (Addendum)
-   Patient has been on oxygen since he was hospitalized for Covid in 2022 - He was walked in office today off oxygen and O2 stayed >92% RA  - Advised patient continue to wear 3L oxygen at night with BIPAP, we will check ONO on BIPAP/room air to determine if oxygen can be discontinued

## 2022-09-14 NOTE — Progress Notes (Signed)
Reviewed and agree with assessment/plan.   Coralyn Helling, MD [facility] Pulmonary/Critical Care 09/14/2022, 2:26 PM Pager:  [phone]

## 2022-09-14 NOTE — Assessment & Plan Note (Signed)
-   Patient has +3 BLE pitting edema, no dyspnea  - Checking BNP and CMET - Continue lasix  daily

## 2022-10-07 ENCOUNTER — Ambulatory Visit
Admission: RE | Admit: 2022-10-07 | Discharge: 2022-10-07 | Disposition: A | Discharge status: Home / Self Care | Payer: Medicaid Other | Attending: Cardiology | Admitting: Cardiology

## 2022-10-07 ENCOUNTER — Other Ambulatory Visit: Payer: Self-pay

## 2022-10-07 ENCOUNTER — Encounter: Payer: Self-pay | Admitting: Cardiology

## 2022-10-07 ENCOUNTER — Encounter
Admission: RE | Disposition: A | Discharge status: Home / Self Care | Payer: Self-pay | Source: Home / Self Care | Attending: Cardiology

## 2022-10-07 DIAGNOSIS — R0600 Dyspnea, unspecified: Secondary | ICD-10-CM | POA: Insufficient documentation

## 2022-10-07 DIAGNOSIS — Z87891 Personal history of nicotine dependence: Secondary | ICD-10-CM | POA: Diagnosis not present

## 2022-10-07 DIAGNOSIS — I110 Hypertensive heart disease with heart failure: Secondary | ICD-10-CM | POA: Diagnosis not present

## 2022-10-07 DIAGNOSIS — I509 Heart failure, unspecified: Secondary | ICD-10-CM | POA: Diagnosis not present

## 2022-10-07 HISTORY — PX: RIGHT HEART CATH: CATH118263

## 2022-10-07 LAB — POCT I-STAT EG7
Acid-Base Excess: 6 mmol/L — ABNORMAL HIGH (ref 0.0–2.0)
Acid-Base Excess: 5 mmol/L — ABNORMAL HIGH (ref 0.0–2.0)
Bicarbonate: 32.7 mmol/L — ABNORMAL HIGH (ref 20.0–28.0)
Bicarbonate: 31.7 mmol/L — ABNORMAL HIGH (ref 20.0–28.0)
Calcium, Ion: 1.26 mmol/L (ref 1.15–1.40)
Calcium, Ion: 1.27 mmol/L (ref 1.15–1.40)
HCT: 39 % (ref 39.0–52.0)
HCT: 39 % (ref 39.0–52.0)
Hemoglobin: 13.3 g/dL (ref 13.0–17.0)
Hemoglobin: 13.3 g/dL (ref 13.0–17.0)
O2 Saturation: 72 %
O2 Saturation: 76 %
Potassium: 4.2 mmol/L (ref 3.5–5.1)
Potassium: 4.2 mmol/L (ref 3.5–5.1)
Sodium: 139 mmol/L (ref 135–145)
Sodium: 138 mmol/L (ref 135–145)
TCO2: 34 mmol/L — ABNORMAL HIGH (ref 22–32)
TCO2: 33 mmol/L — ABNORMAL HIGH (ref 22–32)
pCO2, Ven: 54.5 mmHg (ref 44–60)
pCO2, Ven: 53.1 mmHg (ref 44–60)
pH, Ven: 7.386 (ref 7.25–7.43)
pH, Ven: 7.383 (ref 7.25–7.43)
pO2, Ven: 39 mmHg (ref 32–45)
pO2, Ven: 42 mmHg (ref 32–45)

## 2022-10-07 LAB — GLUCOSE, CAPILLARY: Glucose-Capillary: 84 mg/dL (ref 70–99)

## 2022-10-07 SURGERY — RIGHT HEART CATH
Anesthesia: Moderate Sedation | Laterality: Right

## 2022-10-07 MED ORDER — SODIUM CHLORIDE 0.9% FLUSH: 3.0000 mL | Freq: Two times a day (BID) | INTRAVENOUS | Status: DC

## 2022-10-07 MED ORDER — HEPARIN (PORCINE) IN NACL 1000-0.9 UT/500ML-% IV SOLN
INTRAVENOUS | Status: AC
  Filled 2022-10-07: qty 1000

## 2022-10-07 MED ORDER — HYDRALAZINE HCL 20 MG/ML IJ SOLN: 10.0000 mg | INTRAMUSCULAR | Status: DC | PRN

## 2022-10-07 MED ORDER — SODIUM CHLORIDE 0.9 % IV SOLN: 250.0000 mL | INTRAVENOUS | Status: DC | PRN

## 2022-10-07 MED ORDER — SODIUM CHLORIDE 0.9 % IV SOLN: INTRAVENOUS | Status: DC

## 2022-10-07 MED ORDER — DIVALPROEX SODIUM 500 MG PO DR TAB
1000.0000 mg | DELAYED_RELEASE_TABLET | Freq: Once | ORAL | Status: AC
  Administered 2022-10-07: 1000 mg via ORAL
  Filled 2022-10-07: qty 2

## 2022-10-07 MED ORDER — FENTANYL CITRATE (PF) 100 MCG/2ML IJ SOLN
INTRAMUSCULAR | Status: DC | PRN
  Administered 2022-10-07: 25 ug via INTRAVENOUS

## 2022-10-07 MED ORDER — MIDAZOLAM HCL 2 MG/2ML IJ SOLN
INTRAMUSCULAR | Status: AC
  Filled 2022-10-07: qty 2

## 2022-10-07 MED ORDER — SODIUM CHLORIDE 0.9% FLUSH: 3.0000 mL | INTRAVENOUS | Status: DC | PRN

## 2022-10-07 MED ORDER — LIDOCAINE HCL (PF) 1 % IJ SOLN
INTRAMUSCULAR | Status: DC | PRN
  Administered 2022-10-07: 20 mL
  Administered 2022-10-07: 10 mL

## 2022-10-07 MED ORDER — FENTANYL CITRATE (PF) 100 MCG/2ML IJ SOLN
INTRAMUSCULAR | Status: AC
  Filled 2022-10-07: qty 2

## 2022-10-07 MED ORDER — ONDANSETRON HCL 4 MG/2ML IJ SOLN: 4.0000 mg | Freq: Four times a day (QID) | INTRAMUSCULAR | Status: DC | PRN

## 2022-10-07 MED ORDER — LABETALOL HCL 5 MG/ML IV SOLN: 10.0000 mg | INTRAVENOUS | Status: DC | PRN

## 2022-10-07 MED ORDER — MIDAZOLAM HCL 2 MG/2ML IJ SOLN
INTRAMUSCULAR | Status: DC | PRN
  Administered 2022-10-07 (×2): .5 mg via INTRAVENOUS

## 2022-10-07 MED ORDER — HEPARIN (PORCINE) IN NACL 1000-0.9 UT/500ML-% IV SOLN
INTRAVENOUS | Status: DC | PRN
  Administered 2022-10-07 (×2): 500 mL

## 2022-10-07 MED ORDER — ACETAMINOPHEN 325 MG PO TABS: 650.0000 mg | ORAL_TABLET | ORAL | Status: DC | PRN

## 2022-10-07 SURGICAL SUPPLY — 14 items
CATH SWAN GANZ 7F STRAIGHT (CATHETERS) IMPLANT
DRAPE BRACHIAL (DRAPES) IMPLANT
DRAPE FEMORAL ANGIO W/ POUCH (DRAPES) IMPLANT
KIT MICROPUNCTURE NIT STIFF (SHEATH) IMPLANT
KIT SYRINGE INJ CVI SPIKEX1 (MISCELLANEOUS) IMPLANT
PACK CARDIAC CATH (CUSTOM PROCEDURE TRAY) IMPLANT
PROTECTION STATION PRESSURIZED (MISCELLANEOUS)
SET ATX SIMPLICITY (MISCELLANEOUS) IMPLANT
SET ATX-X65L (MISCELLANEOUS) IMPLANT
SHEATH AVANTI 7FRX11 (SHEATH) IMPLANT
SHEATH GLIDE SLENDER 4/5FR (SHEATH) IMPLANT
STATION PROTECTION PRESSURIZED (MISCELLANEOUS) IMPLANT
TUBING CIL FLEX 10 FLL-RA (TUBING) IMPLANT
WIRE NITINOL .018 (WIRE) IMPLANT

## 2022-10-07 NOTE — Progress Notes (Signed)
Patient stated, "I feel like I do before I have a seizure and I didn't take my medicine this morning." This RN contacted MD to make aware. MD said to give the seizure medication to patient if we can get it. This RN gave patient medicine. Will continue to monitor.

## 2022-10-07 NOTE — H&P (Signed)
Advanced Heart Failure Team History and Physical Note   PCP:  Anselm Jungling, NP  PCP-Cardiology: Yvonne Kendall, MD     Reason for Admission: RHC for dyspnea   HPI:   Patient with chronic dyspnea and CHF, RHC planned to assess filling pressures.     Review of Systems: [y] = yes, [ ]  = no   General: Weight gain [ ] ; Weight loss [ ] ; Anorexia [ ] ; Fatigue [ ] ; Fever [ ] ; Chills [ ] ; Weakness [ ]   Cardiac: Chest pain/pressure [ ] ; Resting SOB [ ] ; Exertional SOB [ ] ; Orthopnea [ ] ; Pedal Edema [ ] ; Palpitations [ ] ; Syncope [ ] ; Presyncope [ ] ; Paroxysmal nocturnal dyspnea[ ]   Pulmonary: Cough [ ] ; Wheezing[ ] ; Hemoptysis[ ] ; Sputum [ ] ; Snoring [ ]   GI: Vomiting[ ] ; Dysphagia[ ] ; Melena[ ] ; Hematochezia [ ] ; Heartburn[ ] ; Abdominal pain [ ] ; Constipation [ ] ; Diarrhea [ ] ; BRBPR [ ]   GU: Hematuria[ ] ; Dysuria [ ] ; Nocturia[ ]   Vascular: Pain in legs with walking [ ] ; Pain in feet with lying flat [ ] ; Non-healing sores [ ] ; Stroke [ ] ; TIA [ ] ; Slurred speech [ ] ;  Neuro: Headaches[ ] ; Vertigo[ ] ; Seizures[ ] ; Paresthesias[ ] ;Blurred vision [ ] ; Diplopia [ ] ; Vision changes [ ]   Ortho/Skin: Arthritis [ ] ; Joint pain [ ] ; Muscle pain [ ] ; Joint swelling [ ] ; Back Pain [ ] ; Rash [ ]   Psych: Depression[ ] ; Anxiety[ ]   Heme: Bleeding problems [ ] ; Clotting disorders [ ] ; Anemia [ ]   Endocrine: Diabetes [ ] ; Thyroid dysfunction[ ]    Home Medications Prior to Admission medications   Medication Sig Start Date End Date Taking? Authorizing Provider  acetaminophen (TYLENOL) 650 MG CR tablet Take 1,300 mg by mouth every 8 (eight) hours as needed for pain.    [provider]  albuterol (VENTOLIN HFA) 108 (90 Base) MCG/ACT inhaler Inhale 2 puffs into the lungs every 6 (six) hours as needed for wheezing. 03/23/22   Glenford Bayley, NP  allopurinol (ZYLOPRIM) 300 MG tablet Take 1 tablet (300 mg total) by mouth daily. 08/29/21   Alford Highland, MD  atorvastatin (LIPITOR) 80 MG  tablet Take 80 mg by mouth daily. 05/11/21   [provider]  baclofen (LIORESAL) 10 MG tablet Take 1 tablet (10 mg total) by mouth 2 (two) times daily as needed for muscle spasms. Home med. Patient taking differently: Take 10 mg by mouth 2 (two) times daily. Home med. 08/16/21   Darlin Priestly, MD  benztropine (COGENTIN) 1 MG tablet Take 1 mg by mouth daily.    [provider]  colchicine 0.6 MG tablet Take 1 tablet (0.6 mg total) by mouth daily for 3 days. 04/29/22 06/17/22  Corena Herter, MD  cyclobenzaprine (FLEXERIL) 5 MG tablet Take 5 mg by mouth 3 (three) times daily as needed for muscle spasms.    [provider]  diltiazem (CARDIZEM CD) 360 MG 24 hr capsule Take 1 capsule (360 mg total) by mouth daily. 09/10/21   Arnetha Courser, MD  divalproex (DEPAKOTE) 500 MG DR tablet Take 2 tablets (1,000 mg total) by mouth 2 (two) times daily. Patient taking differently: Take 1,000 mg by mouth daily at 6 (six) AM. 09/25/21   Wouk, Wilfred Curtis, MD  ELIQUIS 5 MG TABS tablet Take 5 mg by mouth 2 (two) times daily. 06/16/21   [provider]  escitalopram (LEXAPRO) 10 MG tablet Take 20 mg by mouth daily. 06/16/21  [provider]  FARXIGA 10 MG TABS tablet TAKE 1 TABLET BY MOUTH ONCE DAILY BEFORE BREAKFAST 05/21/22   [name] A, FNP  fluticasone (FLONASE) 50 MCG/ACT nasal spray Place 2 sprays into both nostrils 2 (two) times daily. 06/16/21   [provider]  furosemide (LASIX) 20 MG tablet Take 1 tablet (20 mg total) by mouth daily. 12/30/21   Sharman Cheek, MD  guaiFENesin (MUCINEX) 600 MG 12 hr tablet Take 1 tablet (600 mg total) by mouth 2 (two) times daily as needed. 05/25/22   Delma Freeze, FNP  hydrOXYzine (ATARAX) 25 MG tablet Take 25 mg by mouth daily. At noon    [provider]  hydrOXYzine (ATARAX) 50 MG tablet Take 50 mg by mouth in the morning and at bedtime.    [provider]  INVEGA 9 MG 24 hr tablet Take 9 mg by mouth  every morning. 07/21/21   [provider]  lithium carbonate (LITHOBID) 300 MG CR tablet Take 600 mg by mouth every evening.    [provider]  loratadine (CLARITIN) 10 MG tablet Take 10 mg by mouth daily. 06/16/21   [provider]  melatonin 3 MG TABS tablet Take 3 mg by mouth at bedtime. 06/08/21   [provider]  metoprolol succinate (TOPROL XL) 100 MG 24 hr tablet Take 1.5 tablets (150 mg total) by mouth daily. Take with or immediately following a meal. 08/05/22   Laurey Morale, MD  naproxen (NAPROSYN) 500 MG tablet Take 1 tablet (500 mg total) by mouth 2 (two) times daily with a meal. 10/07/21   Jene Every, MD  omeprazole (PRILOSEC) 20 MG capsule Take 20 mg by mouth daily.    [provider]  sacubitril-valsartan (ENTRESTO) 24-26 MG Take 1 tablet by mouth 2 (two) times daily. 06/21/22   Laurey Morale, MD  tamsulosin (FLOMAX) 0.4 MG CAPS capsule Take 1 capsule (0.4 mg total) by mouth daily. 09/10/21   Arnetha Courser, MD  traMADol (ULTRAM) 50 MG tablet Take 50 mg by mouth every 6 (six) hours as needed.    [provider]    Past Medical History: Past Medical History:  Diagnosis Date   Cardiomyopathy (HCC)    a.) TTE 08/04/2021: EF 50%; b.) TTE 10/22/2021: EF 45-50-%; c.) TTE 08/22/2022: EF 55-60%   Chest pain    CHF (congestive heart failure) (HCC)    a.) TTE 08/04/2021: EF 50%, mild LVH, RVSF norm; b.) TTE 10/22/2021: EF 45-50%, glob HK, RVSF norm; c.) TTE 08/22/2022: EF 55-60%, mild dil LV, RVSF norm.   Conversion disorder    Current use of long term anticoagulation    Dyspnea    GERD (gastroesophageal reflux disease)    Hepatic steatosis    Hyperlipidemia    Hypertension    Insomnia    a.) melatonin PRN   Long term current use of anticoagulant    a.) apixaban   OSA on CPAP    Persistent atrial fibrillation and flutter (HCC)    a.) CHA2DS2VASc = 5 (CHF, HTN, TIA x 2, T2DM);  b.) rate/rhythm maintained on oral diltiazem +  metoprolol succinate; chronically anticoagulated with apixaban   Schizoaffective disorder (HCC)    Seizure disorder (HCC)    T2DM (type 2 diabetes mellitus) (HCC)    TIA (transient ischemic attack)    Urethral stricture    a. 08/2021 s/p cystoscopy and urethral dilation.    Past Surgical History: Past Surgical History:  Procedure Laterality Date  CYSTOSCOPY WITH URETHRAL DILATATION N/A 09/07/2021   Procedure: CYSTOSCOPY WITH URETHRAL DILATATION CATHETER PLACEMENT;  Surgeon: Riki Altes, MD;  Location: [facility] ORS;  Service: Urology;  Laterality: N/A;   CYSTOSCOPY WITH URETHRAL DILATATION N/A 09/06/2022   Procedure: CYSTOSCOPY WITH URETHRAL BALLOON DILATATION USING OPTILUME;  Surgeon: Riki Altes, MD;  Location: [facility] OR[facility];  Service: Urology;  Laterality: N/A;    Family History:  No family history on file.  Social History: Social History   Socioeconomic History   Marital status: Single    Spouse name: Not on file   Number of children: Not on file   Years of education: Not on file   Highest education level: Not on file  Occupational History   Not on file  Tobacco Use   Smoking status: Former    Years: 2    Types: Cigarettes    Quit date: 08/2021    Years since quitting: 1.1   Smokeless tobacco: Never  Vaping Use   Vaping Use: Never used  Substance and Sexual Activity   Alcohol use: Not Currently   Drug use: Never   Sexual activity: Not on file  Other Topics Concern   Not on file  Social History Narrative   Now living in a group home in [place].   Social Determinants of Health   Financial Resource Strain: Not on file  Food Insecurity: Not on file  Transportation Needs: Not on file  Physical Activity: Not on file  Stress: Not on file  Social Connections: Not on file    Allergies:  Allergies  Allergen Reactions   Haldol [Haloperidol] Other (See Comments)    SI   Dermatitis Antigen Rash   Meperidine     Meperidine   Abilify [Aripiprazole] Palpitations    Demerol [Meperidine Hcl] Hives   Tape Rash    Other reaction(s): Other (See Comments) Itching, skin tear    Objective:    Vital Signs:       There were no vitals filed for this visit.   Physical Exam     General:  Well appearing. No respiratory difficulty HEENT: Normal Neck: Supple. no JVD. Carotids 2+ bilat; no bruits. No lymphadenopathy or thyromegaly appreciated. Cor: PMI nondisplaced. Regular rate & rhythm. No rubs, gallops or murmurs. Lungs: Clear Abdomen: Soft, nontender, nondistended. No hepatosplenomegaly. No bruits or masses. Good bowel sounds. Extremities: No cyanosis, clubbing, rash, edema Neuro: Alert & oriented x 3, cranial nerves grossly intact. moves all 4 extremities w/o difficulty. Affect pleasant.   Labs     Basic Metabolic Panel: No results for input(s): "NA", "K", "CL", "CO2", "GLUCOSE", "BUN", "CREATININE", "CALCIUM", "MG", "PHOS" in the last 168 hours.  Liver Function Tests: No results for input(s): "AST", "ALT", "ALKPHOS", "BILITOT", "PROT", "ALBUMIN" in the last 168 hours. No results for input(s): "LIPASE", "AMYLASE" in the last 168 hours. No results for input(s): "AMMONIA" in the last 168 hours.  CBC: No results for input(s): "WBC", "NEUTROABS", "HGB", "HCT", "MCV", "PLT" in the last 168 hours.  Cardiac Enzymes: No results for input(s): "CKTOTAL", "CKMB", "CKMBINDEX", "TROPONINI" in the last 168 hours.  BNP: BNP (last 3 results) Recent Labs    06/21/22 1249 08/05/22 1249 09/14/22 1107  BNP 85.2 319.0* 90.8    ProBNP (last 3 results) No results for input(s): "PROBNP" in the last 8760 hours.   CBG: No results for input(s): "GLUCAP" in the last 168 hours.  Coagulation Studies: No results for input(s): "LABPROT", "INR" in the last 72 hours.  Imaging: No  results found.    Assessment/Plan   RHC planned today to assess for dyspnea.    Marca Ancona, MD 10/07/2022, 6:53 AM  Advanced Heart Failure Team Pager [phone] (M-F; 7a  - 5p)  Please contact [facility] for night-coverage after hours (4p -7a ) and weekends on [email or web address]

## 2022-10-19 ENCOUNTER — Telehealth: Payer: Self-pay

## 2022-10-19 ENCOUNTER — Ambulatory Visit: Payer: Medicaid Other | Admitting: Urology

## 2022-10-19 NOTE — Telephone Encounter (Signed)
Called and left message with patients caregiver on 10/18/22 to contact the clinic to schedule patient a follow-up appointment.

## 2022-10-21 ENCOUNTER — Encounter: Payer: Self-pay | Admitting: Urology

## 2022-10-26 ENCOUNTER — Encounter: Payer: Self-pay | Admitting: Family

## 2022-10-26 ENCOUNTER — Ambulatory Visit: Payer: Medicaid Other | Attending: Family | Admitting: Family

## 2022-10-26 ENCOUNTER — Other Ambulatory Visit (HOSPITAL_COMMUNITY): Payer: Self-pay

## 2022-10-26 VITALS — BP 143/75 | HR 81 | Ht 77.0 in | Wt 380.0 lb

## 2022-10-26 DIAGNOSIS — G4733 Obstructive sleep apnea (adult) (pediatric): Secondary | ICD-10-CM | POA: Diagnosis not present

## 2022-10-26 DIAGNOSIS — I5022 Chronic systolic (congestive) heart failure: Secondary | ICD-10-CM | POA: Diagnosis not present

## 2022-10-26 DIAGNOSIS — I480 Paroxysmal atrial fibrillation: Secondary | ICD-10-CM | POA: Diagnosis not present

## 2022-10-26 DIAGNOSIS — F259 Schizoaffective disorder, unspecified: Secondary | ICD-10-CM | POA: Diagnosis not present

## 2022-10-26 DIAGNOSIS — I110 Hypertensive heart disease with heart failure: Secondary | ICD-10-CM | POA: Diagnosis not present

## 2022-10-26 DIAGNOSIS — Z8673 Personal history of transient ischemic attack (TIA), and cerebral infarction without residual deficits: Secondary | ICD-10-CM | POA: Insufficient documentation

## 2022-10-26 DIAGNOSIS — E669 Obesity, unspecified: Secondary | ICD-10-CM | POA: Insufficient documentation

## 2022-10-26 DIAGNOSIS — E119 Type 2 diabetes mellitus without complications: Secondary | ICD-10-CM | POA: Diagnosis not present

## 2022-10-26 DIAGNOSIS — E785 Hyperlipidemia, unspecified: Secondary | ICD-10-CM | POA: Insufficient documentation

## 2022-10-26 DIAGNOSIS — Z87891 Personal history of nicotine dependence: Secondary | ICD-10-CM | POA: Insufficient documentation

## 2022-10-26 DIAGNOSIS — I509 Heart failure, unspecified: Secondary | ICD-10-CM | POA: Diagnosis present

## 2022-10-26 DIAGNOSIS — Z7901 Long term (current) use of anticoagulants: Secondary | ICD-10-CM | POA: Diagnosis not present

## 2022-10-26 DIAGNOSIS — I4819 Other persistent atrial fibrillation: Secondary | ICD-10-CM | POA: Diagnosis not present

## 2022-10-26 DIAGNOSIS — I10 Essential (primary) hypertension: Secondary | ICD-10-CM

## 2022-10-26 MED ORDER — FUROSEMIDE 20 MG PO TABS: 20.0000 mg | ORAL_TABLET | Freq: Every day | ORAL | 0 refills | Status: DC

## 2022-10-26 MED ORDER — POTASSIUM CHLORIDE CRYS ER 20 MEQ PO TBCR: 20.0000 meq | EXTENDED_RELEASE_TABLET | Freq: Every day | ORAL | 0 refills | Status: DC

## 2022-10-26 NOTE — Progress Notes (Addendum)
Patient ID: Daniel [name], male    DOB: [birth year], 32 y.o.   MRN: [id number]  PCP: Anselm Jungling, NP (at group home) Primary Cardiologist: Yvonne Kendall, MD (last seen 01/24) HF provider: Marca Ancona, MD (last seen 03/24)  HPI:  Daniel [name] is a 32 y/o male with a history of DM, hyperlipidemia, HTN, paroxysmal atrial fibrillation, conversion disorder, sleep apnea, schizoaffective disorder, seizure, TIA, urethral stricture, tobacco use and chronic heart failure. Patient has had a long history of paroxysmal AF.  He says this was diagnosed when he was 20 and he was told that it was triggered by excessive use of energy drinks.  He never abused drugs or ETOH per his report though he was a smoker until 5/23.  He was followed in [place] in the past.  There was talk of atrial fibrillation ablation but it was never undertaken. Subsequently, he moved to a group home in [place].    Patient has had home oxygen since 2022 per his report.  High resolution CT chest in 2/24 showed no ILD, possible small airways disease.  PFTs in 2/24 showed minimal obstructive airways disease.  Sleep study showed severe OSA.  Was in the ED 08/21/22 due to syncope while urinating. Syncope thought to be due to vasovagal response. His EKG shows saddleback ST changes in V1 V2 that resolved on recheck. Was in the ED 08/05/22 due to dizziness, chest tightness & LBP. Work up is negative.   Echo 08/22/22: EF 55-60%. Echo 10/22/21: EF of 45-50%. Echo 08/04/21: EF of 50% along with mild LVH  RHC 10/07/22: Hemodynamics (mmHg) RA mean 2 RV 20/2 PA 23/8, mean 15 PCWP mean 3  Oxygen saturations: PA 74% AO 96%  Cardiac Output (Fick) 9.71  Cardiac Index (Fick) 3.34   He presents today for a HF f/u visit with a chief complaint of minimal SOB with moderate exertion. Chronic in nature although he feels like it has worsened over the last couple of days. Has associated pedal edema & occasional chest tightness along with this. He  feels like the chest tightness may be due to his anxiety. Denies palpitations or dizziness. Is supposed to be getting bipap but hasn't gotten that arranged yet.   Did recently get engaged & says that he's very happy about this. Has been eating salty potato chips over the last several days. Is interested in getting some nutrition support as well as starting an exercise program. Would consider GLP-1 if it's covered although he reports concern about possible suicidal thoughts w/ this class of medication.   ROS: All systems negative except as listed in HPI, PMH and Problem List.  SH:  Social History   Socioeconomic History   Marital status: Single    Spouse name: Not on file   Number of children: Not on file   Years of education: Not on file   Highest education level: Not on file  Occupational History   Not on file  Tobacco Use   Smoking status: Former    Years: 2    Types: Cigarettes    Quit date: 08/2021    Years since quitting: 1.1   Smokeless tobacco: Never  Vaping Use   Vaping Use: Never used  Substance and Sexual Activity   Alcohol use: Not Currently   Drug use: Never   Sexual activity: Not on file  Other Topics Concern   Not on file  Social History Narrative   Now living in a group home in [place].   Social Determinants  of Health   Financial Resource Strain: Not on file  Food Insecurity: Not on file  Transportation Needs: Not on file  Physical Activity: Not on file  Stress: Not on file  Social Connections: Not on file  Intimate Partner Violence: Not on file    FH: No family history on file.  Past Medical History:  Diagnosis Date   Cardiomyopathy [facility])    a.) TTE 08/04/2021: EF 50%; b.) TTE 10/22/2021: EF 45-50-%; c.) TTE 08/22/2022: EF 55-60%   Chest pain    CHF (congestive heart failure) (HCC)    a.) TTE 08/04/2021: EF 50%, mild LVH, RVSF norm; b.) TTE 10/22/2021: EF 45-50%, glob HK, RVSF norm; c.) TTE 08/22/2022: EF 55-60%, mild dil LV, RVSF norm.    Conversion disorder    Current use of long term anticoagulation    Dyspnea    GERD (gastroesophageal reflux disease)    Hepatic steatosis    Hyperlipidemia    Hypertension    Insomnia    a.) melatonin PRN   Long term current use of anticoagulant    a.) apixaban   OSA on CPAP    Persistent atrial fibrillation and flutter (HCC)    a.) CHA2DS2VASc = 5 (CHF, HTN, TIA x 2, T2DM);  b.) rate/rhythm maintained on oral diltiazem + metoprolol succinate; chronically anticoagulated with apixaban   Schizoaffective disorder (HCC)    Seizure disorder (HCC)    T2DM (type 2 diabetes mellitus) (HCC)    TIA (transient ischemic attack)    Urethral stricture    a. 08/2021 s/p cystoscopy and urethral dilation.    Current Outpatient Medications  Medication Sig Dispense Refill   acetaminophen (TYLENOL) 650 MG CR tablet Take 1,300 mg by mouth every 8 (eight) hours as needed for pain.     albuterol (VENTOLIN HFA) 108 (90 Base) MCG/ACT inhaler Inhale 2 puffs into the lungs every 6 (six) hours as needed for wheezing. 1 each 2   allopurinol (ZYLOPRIM) 300 MG tablet Take 1 tablet (300 mg total) by mouth daily. 30 tablet 0   atorvastatin (LIPITOR) 80 MG tablet Take 80 mg by mouth daily.     baclofen (LIORESAL) 10 MG tablet Take 1 tablet (10 mg total) by mouth 2 (two) times daily as needed for muscle spasms. Home med. (Patient taking differently: Take 10 mg by mouth 2 (two) times daily. Home med.) 30 each 0   benztropine (COGENTIN) 1 MG tablet Take 1 mg by mouth daily.     colchicine 0.6 MG tablet Take 1 tablet (0.6 mg total) by mouth daily for 3 days. 3 tablet 0   cyclobenzaprine (FLEXERIL) 5 MG tablet Take 5 mg by mouth 3 (three) times daily as needed for muscle spasms.     diltiazem (CARDIZEM CD) 360 MG 24 hr capsule Take 1 capsule (360 mg total) by mouth daily. 30 capsule 1   divalproex (DEPAKOTE) 500 MG DR tablet Take 2 tablets (1,000 mg total) by mouth 2 (two) times daily. (Patient taking differently: Take  1,000 mg by mouth daily at 6 (six) AM.)     ELIQUIS 5 MG TABS tablet Take 5 mg by mouth 2 (two) times daily.     escitalopram (LEXAPRO) 10 MG tablet Take 20 mg by mouth daily.     FARXIGA 10 MG TABS tablet TAKE 1 TABLET BY MOUTH ONCE DAILY BEFORE BREAKFAST 28 tablet 10   fluticasone (FLONASE) 50 MCG/ACT nasal spray Place 2 sprays into both nostrils 2 (two) times daily.  furosemide (LASIX) 20 MG tablet Take 1 tablet (20 mg total) by mouth daily. 30 tablet 0   guaiFENesin (MUCINEX) 600 MG 12 hr tablet Take 1 tablet (600 mg total) by mouth 2 (two) times daily as needed. 60 tablet 1   hydrOXYzine (ATARAX) 25 MG tablet Take 25 mg by mouth daily. At noon     hydrOXYzine (ATARAX) 50 MG tablet Take 50 mg by mouth in the morning and at bedtime.     INVEGA 9 MG 24 hr tablet Take 9 mg by mouth every morning.     lithium carbonate (LITHOBID) 300 MG CR tablet Take 600 mg by mouth every evening.     loratadine (CLARITIN) 10 MG tablet Take 10 mg by mouth daily.     melatonin 3 MG TABS tablet Take 3 mg by mouth at bedtime.     metoprolol succinate (TOPROL XL) 100 MG 24 hr tablet Take 1.5 tablets (150 mg total) by mouth daily. Take with or immediately following a meal. 45 tablet 3   naproxen (NAPROSYN) 500 MG tablet Take 1 tablet (500 mg total) by mouth 2 (two) times daily with a meal. 20 tablet 2   omeprazole (PRILOSEC) 20 MG capsule Take 20 mg by mouth daily.     sacubitril-valsartan (ENTRESTO) 24-26 MG Take 1 tablet by mouth 2 (two) times daily. 60 tablet 5   tamsulosin (FLOMAX) 0.4 MG CAPS capsule Take 1 capsule (0.4 mg total) by mouth daily. 30 capsule 2   traMADol (ULTRAM) 50 MG tablet Take 50 mg by mouth every 6 (six) hours as needed.     No current facility-administered medications for this visit.   Vitals:   10/26/22 0906  BP: (!) 143/75  Pulse: 81  SpO2: 99%  Weight: (!) 380 lb (172.4 kg)  Height: 6\' 5"  (1.956 m)   Wt Readings from Last 3 Encounters:  10/26/22 (!) 380 lb (172.4 kg)   10/07/22 (!) 370 lb (167.8 kg)  09/14/22 (!) 369 lb 12.8 oz (167.7 kg)   Lab Results  Component Value Date   CREATININE 0.81 09/14/2022   CREATININE 0.95 08/21/2022   CREATININE 0.78 08/05/2022   PHYSICAL EXAM:  General:  Well appearing. No resp difficulty HEENT: normal Neck: supple. JVP flat. No lymphadenopathy or thryomegaly appreciated. Cor: PMI normal. Regular rate & rhythm. No rubs, gallops or murmurs. Lungs: clear Abdomen: soft, nontender, nondistended. No hepatosplenomegaly. No bruits or masses. Good bowel sounds. Extremities: no cyanosis, clubbing, rash, 1+ pitting edema bilateral lower legs Neuro: alert & orientedx3, cranial nerves grossly intact. Moves all 4 extremities w/o difficulty. Affect pleasant.   ECG: not done  Reds: 44%   ASSESSMENT & PLAN:  1: NICM with mildly reduced ejection fraction- - likely due to AF/ OSA - NYHA class II - mildly fluid overloaded w/ symptoms and elevated ReDs reading - weighing daily; reminded to call for an overnight weight gain of > 2 pounds or a weekly weight gain of > 5 pounds - weight down 1.2 pounds from last visit here 2 months ago - Echo 08/22/22: EF 55-60%.  - Echo 10/22/21: EF of 45-50%.  - Echo 08/04/21: EF of 50% along with mild LVH - RHC 10/07/22:   Hemodynamics (mmHg)   RA mean 2   RV 20/2   PA 23/8, mean 15   PCWP mean 3   Oxygen saturations:   PA 74%   AO 96%   Cardiac Output (Fick) 9.71    Cardiac Index (Fick) 3.34  - he  says that he's trying to eat better although has been eating salty potato chips recently - ReDs clip reading today is elevated at 44% - double furosemide for 2 days with potassium daily for 2 days - BMP next week - not adding salt and says that he doesn't add salt to his food  - emphasized keeping daily fluid intake to 60 ounces daily  - continue metoprolol succinate 150mg  daily - continue farxiga 10mg  daily - continue entresto 24/26mg  BID - continue furosemide 20mg  daily after  above increased dose - saw HF provider Shirlee Latch) 03/24 - pulmonary rehab referral placed again (this was ordered back in January but caregiver says they never heard from them) - BNP 09/14/22 was 90.8 - PharmD reconciled meds   2: HTN- - BP 143/75 - order written for daily BP readings; log provided and advised to bring the log to each visit - sees PCP at the group home  - BMP 09/14/22 reviewed and showed sodium 137, potassium 3.9, creatinine 0.81 and GFR >60  3: Paroxysmal atrial fibrillation- - per his report, onset was around age 70 - saw cardiology ([place]) 01/24 - continue apixaban 5mg  BID - would pursue atrial fibrillation ablation if he has recurrence of atrial fibrillation once he is on bipap and has lost additional weight.   4: Obesity- - A1c 08/05/22 was 5.8% - lifestyle center referral made for nutrition therapy - PharmD found that [facility] $4 copay x90 days with no PA required; will discuss further next week  5: Sleep apnea- - saw pulmonology Clent [facility]ges) 04/24 - Split night sleep study showed severe OSA, AHI 143/hour with severe oxygen desaturations to 63%. Corrected with BIPAP 20/20cm h20 with large full facemask; to be started on bipap but is still waiting on this - wearing oxygen (through [employer]) at 4L at bedtime and occasionally during the day   Return next week, sooner if needed.

## 2022-10-26 NOTE — Progress Notes (Signed)
[facility] HEART FAILURE CLINIC - Pharmacist Note  Daniel [name] is a 32 y.o. male with HFpEF (EF >50%) presenting to the Heart Failure Clinic for follow up. Patient presents today with his guardian. He reports doing well on his current regimen. He reports wanting to lose more weight and that he is trying to make dietary changes. He asks about whether Ozempic would be an option for him though he worries about the possibility of developing suicidal ideation on this drug. We discussed that this is not an impossibility, but the FDA currently does not believe there to be a link between these drugs and suicidal ideation. He is agreeable to try it pending insurance approval. He reports some leg swelling and recent shortness of breath. He reports weighing himself almost daily. He notes some weight gain recently and wonders if this is fluid. Patient was provided a BP and weight log in clinic today.  Recent ED Visit (past 6 months):  Date: 08/21/2022, CC: fall, chest pain Date: 08/05/2022, CC: dizziness Date: 07/07/2022, CC: anxiety Date: 06/24/2022, CC: chest pain Date: 05/22/2022, CC: weakness Date: 04/29/2022, CC: sore throat, chest pain, cough  Guideline-Directed Medical Therapy/Evidence Based Medicine ACE/ARB/ARNI: Sacubitril/valsartan 24/26 mg twice daily Beta Blocker: Metoprolol succinate 150 mg daily Aldosterone Antagonist: none Diuretic: Furosemide 20 mg daily SGLT2i: Dapagliflozin 10 mg daily  Adherence Assessment Do you ever forget to take your medication? [] Yes [x] No  Do you ever skip doses due to side effects? [] Yes [x] No  Do you have trouble affording your medicines? [] Yes [x] No  Are you ever unable to pick up your medication due to transportation difficulties? [] Yes [x] No  Do you ever stop taking your medications because you don't believe they are helping? [] Yes [x] No  Do you check your weight daily? [x] Yes [] No  Adherence strategy: blister packs Barriers to obtaining medications: none  reported   Diagnostics ECHO: Date 08/22/2022, EF 55-60%, no RWMA RHC: Date 10/07/2022, normal filling pressures and PA pressure, high CO  Vitals    10/26/2022    9:06 AM 10/07/2022   11:00 AM 10/07/2022   10:00 AM  Vitals with BMI  Height 6\' 5"     Weight 380 lbs    BMI 45.05    Systolic 143 112 161  Diastolic 75 68 72  Pulse 81 77 75     Recent Labs    Latest Ref Rng & Units 10/07/2022    8:51 AM 10/07/2022    8:50 AM 09/14/2022   11:07 AM  BMP  Glucose 70 - 99 mg/dL   096   BUN 6 - 20 mg/dL   11   Creatinine 0.45 - 1.24 mg/dL   4.09   Sodium 811 - 914 mmol/L 138  139  137   Potassium 3.5 - 5.1 mmol/L 4.2  4.2  3.9   Chloride 98 - 111 mmol/L   99   CO2 22 - 32 mmol/L   30   Calcium 8.9 - 10.3 mg/dL   9.3     Past Medical History Past Medical History:  Diagnosis Date   Cardiomyopathy (HCC)    a.) TTE 08/04/2021: EF 50%; b.) TTE 10/22/2021: EF 45-50-%; c.) TTE 08/22/2022: EF 55-60%   Chest pain    CHF (congestive heart failure) (HCC)    a.) TTE 08/04/2021: EF 50%, mild LVH, RVSF norm; b.) TTE 10/22/2021: EF 45-50%, glob HK, RVSF norm; c.) TTE 08/22/2022: EF 55-60%, mild dil LV, RVSF norm.   Conversion disorder    Current use of long term  anticoagulation    Dyspnea    GERD (gastroesophageal reflux disease)    Hepatic steatosis    Hyperlipidemia    Hypertension    Insomnia    a.) melatonin PRN   Long term current use of anticoagulant    a.) apixaban   OSA on CPAP    Persistent atrial fibrillation and flutter (HCC)    a.) CHA2DS2VASc = 5 (CHF, HTN, TIA x 2, T2DM);  b.) rate/rhythm maintained on oral diltiazem + metoprolol succinate; chronically anticoagulated with apixaban   Schizoaffective disorder (HCC)    Seizure disorder (HCC)    T2DM (type 2 diabetes mellitus) (HCC)    TIA (transient ischemic attack)    Urethral stricture    a. 08/2021 s/p cystoscopy and urethral dilation.    Plan Continue regimen as directed by NP ReDS today 44% Adjust diuretic regimen  per NP Consider addition of Wegovy for weight management ($4 copay for 90d supply) Annual echo due 07/2023  Time spent: 15 minutes  Celene Squibb, PharmD PGY1 Pharmacy Resident 10/26/2022 9:11 AM

## 2022-10-28 ENCOUNTER — Other Ambulatory Visit: Payer: Self-pay | Admitting: *Deleted

## 2022-10-28 DIAGNOSIS — G4733 Obstructive sleep apnea (adult) (pediatric): Secondary | ICD-10-CM

## 2022-10-28 DIAGNOSIS — J9611 Chronic respiratory failure with hypoxia: Secondary | ICD-10-CM

## 2022-10-31 ENCOUNTER — Encounter: Payer: Self-pay | Admitting: Intensive Care

## 2022-10-31 ENCOUNTER — Emergency Department
Admission: EM | Admit: 2022-10-31 | Discharge: 2022-10-31 | Disposition: A | Discharge status: Home / Self Care | Payer: Medicaid Other | Attending: Emergency Medicine | Admitting: Emergency Medicine

## 2022-10-31 ENCOUNTER — Emergency Department: Payer: Medicaid Other

## 2022-10-31 ENCOUNTER — Other Ambulatory Visit: Payer: Self-pay

## 2022-10-31 DIAGNOSIS — M79605 Pain in left leg: Secondary | ICD-10-CM | POA: Insufficient documentation

## 2022-10-31 DIAGNOSIS — L03116 Cellulitis of left lower limb: Secondary | ICD-10-CM | POA: Diagnosis not present

## 2022-10-31 LAB — COMPREHENSIVE METABOLIC PANEL
ALT: 18 U/L (ref 0–44)
AST: 17 U/L (ref 15–41)
Albumin: 4.4 g/dL (ref 3.5–5.0)
Alkaline Phosphatase: 82 U/L (ref 38–126)
Anion gap: 9 (ref 5–15)
BUN: 12 mg/dL (ref 6–20)
CO2: 30 mmol/L (ref 22–32)
Calcium: 9.2 mg/dL (ref 8.9–10.3)
Chloride: 100 mmol/L (ref 98–111)
Creatinine, Ser: 0.83 mg/dL (ref 0.61–1.24)
GFR, Estimated: >60 mL/min (ref >60)
Glucose, Bld: 109 mg/dL — ABNORMAL HIGH (ref 70–99)
Potassium: 4.1 mmol/L (ref 3.5–5.1)
Sodium: 139 mmol/L (ref 135–145)
Total Bilirubin: 0.8 mg/dL (ref 0.3–1.2)
Total Protein: 7.9 g/dL (ref 6.5–8.1)

## 2022-10-31 LAB — CBC WITH DIFFERENTIAL/PLATELET
Abs Immature Granulocytes: 0.04 10*3/uL (ref 0.00–0.07)
Basophils Absolute: 0 10*3/uL (ref 0.0–0.1)
Basophils Relative: 1 %
Eosinophils Absolute: 0.1 10*3/uL (ref 0.0–0.5)
Eosinophils Relative: 1 %
HCT: 42.4 % (ref 39.0–52.0)
Hemoglobin: 13.3 g/dL (ref 13.0–17.0)
Immature Granulocytes: 1 %
Lymphocytes Relative: 25 %
Lymphs Abs: 1.6 10*3/uL (ref 0.7–4.0)
MCH: 27.4 pg (ref 26.0–34.0)
MCHC: 31.4 g/dL (ref 30.0–36.0)
MCV: 87.2 fL (ref 80.0–100.0)
Monocytes Absolute: 0.7 10*3/uL (ref 0.1–1.0)
Monocytes Relative: 10 %
Neutro Abs: 4.1 10*3/uL (ref 1.7–7.7)
Neutrophils Relative %: 62 %
Platelets: 266 10*3/uL (ref 150–400)
RBC: 4.86 MIL/uL (ref 4.22–5.81)
RDW: 14.4 % (ref 11.5–15.5)
WBC: 6.5 10*3/uL (ref 4.0–10.5)
nRBC: 0 % (ref 0.0–0.2)

## 2022-10-31 LAB — URIC ACID: Uric Acid, Serum: 5.6 mg/dL (ref 3.7–8.6)

## 2022-10-31 MED ORDER — DOXYCYCLINE HYCLATE 50 MG PO CAPS: 50.0000 mg | ORAL_CAPSULE | Freq: Two times a day (BID) | ORAL | 0 refills | Status: AC

## 2022-10-31 MED ORDER — HYDROCODONE-ACETAMINOPHEN 5-325 MG PO TABS: 1.0000 | ORAL_TABLET | Freq: Four times a day (QID) | ORAL | 0 refills | Status: AC | PRN

## 2022-10-31 MED ORDER — CEPHALEXIN 500 MG PO CAPS: 500.0000 mg | ORAL_CAPSULE | Freq: Four times a day (QID) | ORAL | 0 refills | Status: AC

## 2022-10-31 MED ORDER — HYDROCODONE-ACETAMINOPHEN 5-325 MG PO TABS
1.0000 | ORAL_TABLET | Freq: Once | ORAL | Status: AC
  Administered 2022-10-31: 1 via ORAL
  Filled 2022-10-31: qty 1

## 2022-10-31 NOTE — ED Triage Notes (Signed)
Patient reports possible gout flare up on left, great toe last week. Reports swelling and pain still present after taking medication. Also left leg is swollen, red, and painful.   Patient reports he no longer wears 4L O2  History A-fib and takes eliquis daily

## 2022-10-31 NOTE — ED Notes (Signed)
Patient transported to Ultrasound 

## 2022-10-31 NOTE — ED Provider Triage Note (Signed)
Emergency Medicine Provider Triage Evaluation Note  Daniel [name] , a 32 y.o. male  was evaluated in triage.  Pt complains of left foot and leg swelling/redness, thought it was gout but has taken medication without relief.  Review of Systems  Positive:  Negative:   Physical Exam  BP 120/84 (BP Location: Left Arm)   Pulse 90   Temp 98.1 F (36.7 C) (Oral)   Resp 16   Ht 6\' 5"  (1.956 m)   Wt (!) 171 kg   SpO2 96%   BMI 44.71 kg/m  Gen:   Awake, no distress   Resp:  Normal effort  MSK:   Moves extremities without difficulty  Other:    Medical Decision Making  Medically screening exam initiated at 11:17 AM.  Appropriate orders placed.  Daniel [name] was informed that the remainder of the evaluation will be completed by another provider, this initial triage assessment does not replace that evaluation, and the importance of remaining in the ED until their evaluation is complete.     Faythe Ghee, PA-C 10/31/22 1118

## 2022-10-31 NOTE — Discharge Instructions (Addendum)
You were seen in the emergency department today for evaluation of pain and swelling in your leg.  I suspect that you do have an infection of the skin noticed cellulitis.  I have sent 2 antibiotics to your pharmacy.  Please take these as directed.  You can use Tylenol and ibuprofen as needed for pain.  If you have breakthrough pain, I sent a prescription for short course of narcotic medicine.  Do not drive or operate machinery when taking this.  Follow-up your primary care doctor within a few days for reevaluation.  If you have significant worsening in your pain, swelling, redness, if you develop fevers, or if you are not improving within 48 hours, please return to the ER for further evaluation.

## 2022-11-01 ENCOUNTER — Telehealth: Payer: Self-pay

## 2022-11-01 NOTE — Telephone Encounter (Signed)
Patient has an appt tomorrow at 10:00am. I called [employer] to get a compliance download on him and they said he has not been set up with his new machine yet.   I tried to contact the patient (or caregiver) to try and reschedule the appt, since the appointment was to check compliance of his new machine. Two of the numbers are not working numbers and I left a message on the mobile number.

## 2022-11-02 ENCOUNTER — Encounter: Payer: Self-pay | Admitting: Primary Care

## 2022-11-02 ENCOUNTER — Ambulatory Visit (INDEPENDENT_AMBULATORY_CARE_PROVIDER_SITE_OTHER): Payer: Medicaid Other | Admitting: Primary Care

## 2022-11-02 ENCOUNTER — Telehealth: Payer: Self-pay | Admitting: Primary Care

## 2022-11-02 VITALS — BP 120/72 | HR 77 | Temp 97.8°F | Ht 77.0 in | Wt 374.6 lb

## 2022-11-02 DIAGNOSIS — J9611 Chronic respiratory failure with hypoxia: Secondary | ICD-10-CM | POA: Diagnosis not present

## 2022-11-02 DIAGNOSIS — L03116 Cellulitis of left lower limb: Secondary | ICD-10-CM

## 2022-11-02 DIAGNOSIS — G4730 Sleep apnea, unspecified: Secondary | ICD-10-CM

## 2022-11-02 DIAGNOSIS — I509 Heart failure, unspecified: Secondary | ICD-10-CM | POA: Diagnosis not present

## 2022-11-02 NOTE — Telephone Encounter (Signed)
The patient was seen in the office today.  Nothing further needed. 

## 2022-11-02 NOTE — Progress Notes (Signed)
Reviewed and agree with assessment/plan.   Coralyn Helling, MD [facility] Pulmonary/Critical Care 11/02/2022, 1:39 PM Pager:  [phone]

## 2022-11-02 NOTE — Patient Instructions (Addendum)
Sleep study showed severe obstructive sleep apnea Please reach out to [employer] regarding BiPAP Once you received BiPAP you need to wear every night and with naps/ and 3L oxygen Work on weight loss as able Continue antibiotics for cellulitis Continue diuretics as instructed by cardiology  Follow-up: 6 to 8 weeks for BiPAP compliance   CPAP and BIPAP Information CPAP and BIPAP are methods that use air pressure to keep your airways open and to help you breathe well. CPAP and BIPAP use different amounts of pressure. Your health care provider will tell you whether CPAP or BIPAP would be more helpful for you. CPAP stands for "continuous positive airway pressure." With CPAP, the amount of pressure stays the same while you breathe in (inhale) and out (exhale). BIPAP stands for "bi-level positive airway pressure." With BIPAP, the amount of pressure will be higher when you inhale and lower when you exhale. This allows you to take larger breaths. CPAP or BIPAP may be used in the hospital, or your health care provider may want you to use it at home. You may need to have a sleep study before your health care provider can order a machine for you to use at home. What are the advantages? CPAP or BIPAP can be helpful if you have: Sleep apnea. Chronic obstructive pulmonary disease (COPD). Heart failure. Medical conditions that cause muscle weakness, including muscular dystrophy or amyotrophic lateral sclerosis (ALS). Other problems that cause breathing to be shallow, weak, abnormal, or difficult. CPAP and BIPAP are most commonly used for obstructive sleep apnea (OSA) to keep the airways from collapsing when the muscles relax during sleep. What are the risks? Generally, this is a safe treatment. However, problems may occur, including: Irritated skin or skin sores if the mask does not fit properly. Dry or stuffy nose or nosebleeds. Dry mouth. Feeling gassy or bloated. Sinus or lung infection if the  equipment is not cleaned properly. When should CPAP or BIPAP be used? In most cases, the mask only needs to be worn during sleep. Generally, the mask needs to be worn throughout the night and during any daytime naps. People with certain medical conditions may also need to wear the mask at other times, such as when they are awake. Follow instructions from your health care provider about when to use the machine. What happens during CPAP or BIPAP?  Both CPAP and BIPAP are provided by a small machine with a flexible plastic tube that attaches to a plastic mask that you wear. Air is blown through the mask into your nose or mouth. The amount of pressure that is used to blow the air can be adjusted on the machine. Your health care provider will set the pressure setting and help you find the best mask for you. Tips for using the mask Because the mask needs to be snug, some people feel trapped or closed-in (claustrophobic) when first using the mask. If you feel this way, you may need to get used to the mask. One way to do this is to hold the mask loosely over your nose or mouth and then gradually apply the mask more snugly. You can also gradually increase the amount of time that you use the mask. Masks are available in various types and sizes. If your mask does not fit well, talk with your health care provider about getting a different one. Some common types of masks include: Full face masks, which fit over the mouth and nose. Nasal masks, which fit over the nose. Nasal  pillow or prong masks, which fit into the nostrils. If you are using a mask that fits over your nose and you tend to breathe through your mouth, a chin strap may be applied to help keep your mouth closed. Use a skin barrier to protect your skin as told by your health care provider. Some CPAP and BIPAP machines have alarms that may sound if the mask comes off or develops a leak. If you have trouble with the mask, it is very important that you  talk with your health care provider about finding a way to make the mask easier to tolerate. Do not stop using the mask. There could be a negative impact on your health if you stop using the mask. Tips for using the machine Place your CPAP or BIPAP machine on a secure table or stand near an electrical outlet. Know where the on/off switch is on the machine. Follow instructions from your health care provider about how to set the pressure on your machine and when you should use it. Do not eat or drink while the CPAP or BIPAP machine is on. Food or fluids could get pushed into your lungs by the pressure of the CPAP or BIPAP. For home use, CPAP and BIPAP machines can be rented or purchased through home health care companies. Many different brands of machines are available. Renting a machine before purchasing may help you find out which particular machine works well for you. Your health insurance company may also decide which machine you may get. Keep the CPAP or BIPAP machine and attachments clean. Ask your health care provider for specific instructions. Check the humidifier if you have a dry stuffy nose or nosebleeds. Make sure it is working correctly. Follow these instructions at home: Take over-the-counter and prescription medicines only as told by your health care provider. Ask if you can take sinus medicine if your sinuses are blocked. Do not use any products that contain nicotine or tobacco. These products include cigarettes, chewing tobacco, and vaping devices, such as e-cigarettes. If you need help quitting, ask your health care provider. Keep all follow-up visits. This is important. Contact a health care provider if: You have redness or pressure sores on your head, face, mouth, or nose from the mask or head gear. You have trouble using the CPAP or BIPAP machine. You cannot tolerate wearing the CPAP or BIPAP mask. Someone tells you that you snore even when wearing your CPAP or BIPAP. Get help  right away if: You have trouble breathing. You feel confused. Summary CPAP and BIPAP are methods that use air pressure to keep your airways open and to help you breathe well. If you have trouble with the mask, it is very important that you talk with your health care provider about finding a way to make the mask easier to tolerate. Do not stop using the mask. There could be a negative impact to your health if you stop using the mask. Follow instructions from your health care provider about when to use the machine. This information is not intended to replace advice given to you by your health care provider. Make sure you discuss any questions you have with your health care provider. Document Revised: 12/23/2020 Document Reviewed: 04/24/2020 Elsevier Patient Education  2023 [employer].

## 2022-11-02 NOTE — Assessment & Plan Note (Addendum)
-   He saw cardiology on 5/29, patient at that time complained of mild shortness of breath and pitting LE edema. ReDs clip reading was elevated at 44%. Lasix was doubled for 2 days. He has chronic LE edema which appears at patients baseline. No reported shortness of breath today. Continue diuretics per cardiology.

## 2022-11-02 NOTE — Telephone Encounter (Signed)
Pt. Was just seen caregiver called back they say that [employer] needs a order signed off to pick up the O2 tanks from the home

## 2022-11-02 NOTE — Assessment & Plan Note (Signed)
-   Patient has severe obstructive sleep apnea, AHI 143/hour. Corrected with BIPAP 20/20cm h20. Order placed for auto BIPAP min EPAP 12/ max IPAP 24 back in April. DME received order but patient never got machine. We have reached out to [employer] and they will contact patient. Once patient received BIPAP advised he aim to wear nightly for 4-6 hours or longer. FU in 6-8 weeks for BIPAP compliance.  

## 2022-11-02 NOTE — Assessment & Plan Note (Signed)
-   No daytime requirements; needs 3L oxygen at night with BIPAP

## 2022-11-02 NOTE — Telephone Encounter (Signed)
I tried to contact the patient or caregiver again this morning. I did not get an answer on the mobile number and the other numbers are still out of service.

## 2022-11-02 NOTE — Telephone Encounter (Signed)
Ok to discontinue daytime oxygen, still needs 3L at night

## 2022-11-02 NOTE — Telephone Encounter (Signed)
NA

## 2022-11-02 NOTE — Assessment & Plan Note (Deleted)
-   Patient has severe obstructive sleep apnea, AHI 143/hour. Corrected with BIPAP 20/20cm h20. Order placed for auto BIPAP min EPAP 12/ max IPAP 24 back in April. DME received order but patient never got machine. We have reached out to [facility] and they will contact patient. Once patient received BIPAP advised he aim to wear nightly for 4-6 hours or longer. FU in 6-8 weeks for BIPAP compliance.

## 2022-11-02 NOTE — Telephone Encounter (Signed)
I have placed the order and notified Mr. Manson Passey [facility]).   Nothing further needed.

## 2022-11-02 NOTE — Progress Notes (Signed)
@Patient  ID: Daniel [name], male    DOB: 22-Sep-1990, 32 y.o.   MRN: [id number]  Chief Complaint  Patient presents with   Follow-up    No SOB, wheezing or cough. Has no been set up with his BiPAP yet.    Referring provider: Anselm Jungling, NP  HPI: 32 year old male, former smoker.  Past medical history significant for afib, HTN, cardiomyopathy, OSA, chronic respiratory failure, type 2 diabetes, schizoaffective/bipolar type, morbid obesity.   Previous [facility] pulmonary encounter: 03/23/2022 Patient presents today for sleep consult. Accompanied by care taker, he lives in group home. Referred by cardiology d/t afib. He continues to have frequent symptomatic atrial fibrillation despite weight loss. He is on metoprolol and diltiazem. He wears 4L oxygen 24/7 since he was hospitalized last year for covid. He has symptoms of snoring, choking/waking up gasping for air at night, restless sleep and daytime sleepiness. He stays tired all day. Typical bedtime is 8pm. It does not take him long to fall asleep. He wakes up 3-4 times a night. He starts his day between 7:30-8:30am. Denies narcolepsy, cataplexy, sleep walking.   Sleep questionnaire Symptoms-  snoring, choking and stops breathing at night, restless sleep, daytime sleepiness   Prior sleep study- none Bedtime-8pm Time to fall asleep- 10 mins  Nocturnal awakenings- 3-4 times Out of bed/start of day-  7:30am-8am  Weight changes- no Do you operate heavy machinery- no Do you currently wear CPAP- no Do you current wear oxygen- yes  [facility]- 11  09/14/2022 Patient presents today for BIPAP follow-up. He has symptoms of snoring and witnessed apnea. Not has tired. He has lost weight. He is exercising/walking. Split night sleep study showed severe OSA, AHI 143/hour with severe oxygen desaturations to 63%. Corrected with BIPAP 20/20cm h20 with large full facemask. Recommended he be started on auto bilevel Min EPAP 12/ Max IPAP 24 with PS 4. We reviewed  sleep study results and treatment options, patient is alright with proceeding with BIPAP. He uses oxygen at night and as needed during daytime. He was walked in office today and was able to maintain O2 level >92% on room air. He gets oxygen through [facility] and would like to stay with them. He has chronic lower extremity swelling, a little worse than normal. He takes lasix 20mg  daily. No shortness of breath.    11/02/2022- Interim hx  Patient presents today for a 22-month follow-up.  He was last seen in April for obstructive sleep apnea and chronic respiratory failure.  Split-night sleep study showed severe OSA, AHI 143 an hour with severe oxygen saturations of 63%. Corrected with BiPAP 20/20 cm H2O with large fullface mask.  We placed an order for auto BiPAP min EPAP 12/max IPAP 24 with pressure support 4.  Since our last office visit he has yet to receive BiPAP.  We contacted [facility], they did receive order but for some reason he never receive unit. They will reach out to patient/guardian. He is doing alright. No longer requiring oxygen during the day. He was walked at last visit and O2 stayed above 92%. He is wearing 3L oxygen at night. He was seen in ED yesterday for cellulitis, he is on 2 different antibiotics. He saw cardiology recently. Reviewed [name] note from 5/29, patient at that time complained of mild shortness of breath and pitting LE edema. ReDs clip reading was elevated at 44%. Lasix was doubled for 2 days. CXR in March 2024 showed clear lungs. Denies shortness of breath, wheezing or cough.   Allergies  Allergen Reactions   Haldol [Haloperidol] Other (See Comments)    SI   Dermatitis Antigen Rash   Meperidine     Meperidine   Abilify [Aripiprazole] Palpitations   Demerol [Meperidine Hcl] Hives   Tape Rash    Other reaction(s): Other (See Comments) Itching, skin tear    Immunization History  Administered Date(s) Administered   Hepatitis A, Ped/Adol-2 Dose 12/22/2005,  07/31/2006   Hepatitis B, PED/ADOLESCENT 01/23/2002, 02/26/2002, 07/23/2002   Influenza Split 05/30/2015   Influenza, High Dose Seasonal PF 04/06/2006, 03/09/2009, 02/16/2020, 04/14/2020   Influenza,inj,Quad PF,6+ Mos 04/05/2006, 03/09/2009, 04/14/2020   PFIZER Comirnaty(Gray Top)Covid-19 Tri-Sucrose Vaccine 09/30/2020   PFIZER(Purple Top)SARS-COV-2 Vaccination 07/12/2019, 08/08/2019, 04/28/2020   Td (Adult), 2 Lf Tetanus Toxid, Preservative Free 06/14/2002, 04/30/2009   Tdap 02/07/2022   Varicella 12/22/2005, 04/06/2006    Past Medical History:  Diagnosis Date   Cardiomyopathy (HCC)    a.) TTE 08/04/2021: EF 50%; b.) TTE 10/22/2021: EF 45-50-%; c.) TTE 08/22/2022: EF 55-60%   Chest pain    CHF (congestive heart failure) (HCC)    a.) TTE 08/04/2021: EF 50%, mild LVH, RVSF norm; b.) TTE 10/22/2021: EF 45-50%, glob HK, RVSF norm; c.) TTE 08/22/2022: EF 55-60%, mild dil LV, RVSF norm.   Conversion disorder    Current use of long term anticoagulation    Dyspnea    GERD (gastroesophageal reflux disease)    Hepatic steatosis    Hyperlipidemia    Hypertension    Insomnia    a.) melatonin PRN   Long term current use of anticoagulant    a.) apixaban   OSA on CPAP    Persistent atrial fibrillation and flutter (HCC)    a.) CHA2DS2VASc = 5 (CHF, HTN, TIA x 2, T2DM);  b.) rate/rhythm maintained on oral diltiazem + metoprolol succinate; chronically anticoagulated with apixaban   Schizoaffective disorder (HCC)    Seizure disorder (HCC)    T2DM (type 2 diabetes mellitus) (HCC)    TIA (transient ischemic attack)    Urethral stricture    a. 08/2021 s/p cystoscopy and urethral dilation.    Tobacco History: Social History   Tobacco Use  Smoking Status Former   Years: 2   Types: Cigarettes   Quit date: 08/2021   Years since quitting: 1.1  Smokeless Tobacco Never   Counseling given: Not Answered   Outpatient Medications Prior to Visit  Medication Sig Dispense Refill   albuterol  (VENTOLIN HFA) 108 (90 Base) MCG/ACT inhaler Inhale 2 puffs into the lungs every 6 (six) hours as needed for wheezing. 1 each 2   allopurinol (ZYLOPRIM) 300 MG tablet Take 1 tablet (300 mg total) by mouth daily. 30 tablet 0   atorvastatin (LIPITOR) 80 MG tablet Take 80 mg by mouth daily.     baclofen (LIORESAL) 10 MG tablet Take 1 tablet (10 mg total) by mouth 2 (two) times daily as needed for muscle spasms. Home med. (Patient taking differently: Take 10 mg by mouth 2 (two) times daily. Home med.) 30 each 0   benztropine (COGENTIN) 1 MG tablet Take 1 mg by mouth daily.     cephALEXin (KEFLEX) 500 MG capsule Take 1 capsule (500 mg total) by mouth 4 (four) times daily for 10 days. 40 capsule 0   cyclobenzaprine (FLEXERIL) 5 MG tablet Take 5 mg by mouth 3 (three) times daily as needed for muscle spasms.     diltiazem (CARDIZEM CD) 360 MG 24 hr capsule Take 1 capsule (360 mg total) by mouth daily. 30  capsule 1   divalproex (DEPAKOTE) 500 MG DR tablet Take 2 tablets (1,000 mg total) by mouth 2 (two) times daily. (Patient taking differently: Take 1,000 mg by mouth daily at 6 (six) AM.)     doxycycline (VIBRAMYCIN) 50 MG capsule Take 1 capsule (50 mg total) by mouth 2 (two) times daily for 10 days. 20 capsule 0   ELIQUIS 5 MG TABS tablet Take 5 mg by mouth 2 (two) times daily.     escitalopram (LEXAPRO) 10 MG tablet Take 20 mg by mouth daily.     FARXIGA 10 MG TABS tablet TAKE 1 TABLET BY MOUTH ONCE DAILY BEFORE BREAKFAST 28 tablet 10   fluticasone (FLONASE) 50 MCG/ACT nasal spray Place 2 sprays into both nostrils 2 (two) times daily.     furosemide (LASIX) 20 MG tablet Take 1 tablet (20 mg total) by mouth daily. 30 tablet 0   furosemide (LASIX) 20 MG tablet Take 1 tablet (20 mg total) by mouth daily. 2 tablet 0   HYDROcodone-acetaminophen (NORCO) 5-325 MG tablet Take 1 tablet by mouth every 6 (six) hours as needed for up to 3 days for moderate pain. 8 tablet 0   hydrOXYzine (ATARAX) 25 MG tablet Take 25  mg by mouth daily. At noon     hydrOXYzine (ATARAX) 50 MG tablet Take 50 mg by mouth in the morning and at bedtime.     INVEGA 9 MG 24 hr tablet Take 9 mg by mouth every morning.     lithium carbonate (LITHOBID) 300 MG CR tablet Take 600 mg by mouth every evening.     loratadine (CLARITIN) 10 MG tablet Take 10 mg by mouth daily.     melatonin 3 MG TABS tablet Take 3 mg by mouth at bedtime.     metoprolol succinate (TOPROL XL) 100 MG 24 hr tablet Take 1.5 tablets (150 mg total) by mouth daily. Take with or immediately following a meal. 45 tablet 3   omeprazole (PRILOSEC) 20 MG capsule Take 20 mg by mouth daily.     potassium chloride SA (KLOR-CON M) 20 MEQ tablet Take 1 tablet (20 mEq total) by mouth daily. 2 tablet 0   sacubitril-valsartan (ENTRESTO) 24-26 MG Take 1 tablet by mouth 2 (two) times daily. 60 tablet 5   tamsulosin (FLOMAX) 0.4 MG CAPS capsule Take 1 capsule (0.4 mg total) by mouth daily. 30 capsule 2   colchicine 0.6 MG tablet Take 1 tablet (0.6 mg total) by mouth daily for 3 days. 3 tablet 0   No facility-administered medications prior to visit.   Review of Systems  Review of Systems  Constitutional:  Positive for fatigue.  HENT: Negative.    Respiratory:  Negative for cough, shortness of breath and wheezing.   Cardiovascular:  Positive for leg swelling.    Physical Exam  BP 120/72 (BP Location: Left Arm, Cuff Size: Large)   Pulse 77   Temp 97.8 F (36.6 C)   Ht 6\' 5"  (1.956 m)   Wt (!) 374 lb 9.6 oz (169.9 kg)   SpO2 96%   BMI 44.42 kg/m  Physical Exam Constitutional:      General: He is not in acute distress.    Appearance: Normal appearance. He is obese. He is not ill-appearing.  HENT:     Head: Normocephalic and atraumatic.  Cardiovascular:     Rate and Rhythm: Normal rate and regular rhythm.  Pulmonary:     Effort: Pulmonary effort is normal.     Breath sounds:  Normal breath sounds. No wheezing, rhonchi or rales.  Musculoskeletal:        General:  Normal range of motion.  Skin:    General: Skin is warm and dry.     Comments: Cellulitis left lower leg  Neurological:     General: No focal deficit present.     Mental Status: He is alert and oriented to person, place, and time. Mental status is at baseline.  Psychiatric:        Mood and Affect: Mood normal.        Behavior: Behavior normal.        Thought Content: Thought content normal.        Judgment: Judgment normal.      Lab Results:  CBC    Component Value Date/Time   WBC 6.5 10/31/2022 1115   RBC 4.86 10/31/2022 1115   HGB 13.3 10/31/2022 1115   HGB 11.9 (L) 04/29/2013 0838   HCT 42.4 10/31/2022 1115   HCT 36.0 (L) 04/29/2013 0838   PLT 266 10/31/2022 1115   PLT 257 04/29/2013 0838   MCV 87.2 10/31/2022 1115   MCV 80 04/29/2013 0838   MCH 27.4 10/31/2022 1115   MCHC 31.4 10/31/2022 1115   RDW 14.4 10/31/2022 1115   RDW 14.1 04/29/2013 0838   LYMPHSABS 1.6 10/31/2022 1115   MONOABS 0.7 10/31/2022 1115   EOSABS 0.1 10/31/2022 1115   BASOSABS 0.0 10/31/2022 1115    BMET    Component Value Date/Time   NA 139 10/31/2022 1115   NA 136 04/29/2013 0838   K 4.1 10/31/2022 1115   K 4.0 04/29/2013 0838   CL 100 10/31/2022 1115   CL 106 04/29/2013 0838   CO2 30 10/31/2022 1115   CO2 25 04/29/2013 0838   GLUCOSE 109 (H) 10/31/2022 1115   GLUCOSE 105 (H) 04/29/2013 0838   BUN 12 10/31/2022 1115   BUN 15 04/29/2013 0838   CREATININE 0.83 10/31/2022 1115   CREATININE 0.99 04/29/2013 0838   CALCIUM 9.2 10/31/2022 1115   CALCIUM 9.2 04/29/2013 0838   GFRNONAA >60 10/31/2022 1115   GFRNONAA >60 04/29/2013 0838   GFRAA >60 04/29/2013 0838    BNP    Component Value Date/Time   BNP 90.8 09/14/2022 1107    ProBNP No results found for: "PROBNP"  Imaging: US Venous Img Lower Unilateral Left  Result Date: 10/31/2022 CLINICAL DATA:  Left leg swelling and redness EXAM: LEFT LOWER EXTREMITY VENOUS DOPPLER ULTRASOUND TECHNIQUE: Gray-scale sonography with  compression, as well as color and duplex ultrasound, were performed to evaluate the deep venous system(s) from the level of the common femoral vein through the popliteal and proximal calf veins. COMPARISON:  None Available. FINDINGS: VENOUS Normal compressibility of the common femoral, superficial femoral, and popliteal veins, as well as the visualized calf veins. Visualized portions of profunda femoral vein and great saphenous vein unremarkable. No filling defects to suggest DVT on grayscale or color Doppler imaging. Doppler waveforms show normal direction of venous flow, normal respiratory plasticity and response to augmentation. Limited views of the contralateral common femoral vein are unremarkable. OTHER Soft tissue edema of the calf. Limitations: none IMPRESSION: Negative. Electronically Signed   By: Agustin Cree M.D.   On: 10/31/2022 12:38   CARDIAC CATHETERIZATION  Result Date: 10/07/2022 1. Normal filling pressures. 2. Normal PA pressure. 3. High cardiac output. No cause for dyspnea noted from this RHC.     Assessment & Plan:   Severe sleep apnea - Patient has  severe obstructive sleep apnea, AHI 143/hour. Corrected with BIPAP 20/20cm h20. Order placed for auto BIPAP min EPAP 12/ max IPAP 24 back in April. DME received order but patient never got machine. We have reached out to [facility] and they will contact patient. Once patient received BIPAP advised he aim to wear nightly for 4-6 hours or longer. FU in 6-8 weeks for BIPAP compliance.   Chronic hypoxemic respiratory failure (HCC) - No daytime requirements; needs 3L oxygen at night with BIPAP   Congestive heart failure (HCC) - He saw cardiology on 5/29, patient at that time complained of mild shortness of breath and pitting LE edema. ReDs clip reading was elevated at 44%. Lasix was doubled for 2 days. He has chronic LE edema which appears at patients baseline. No reported shortness of breath today. Continue diuretics per cardiology.    Cellulitis of left lower extremity - Stable/improved; Erythema is mild and within markings. No fevers. Continue Keflex and doxycyline per ED.    Glenford Bayley, NP 11/02/2022

## 2022-11-02 NOTE — Telephone Encounter (Signed)
Patient is not using the O2 during the day. Ok to place the order to d/c O2?

## 2022-11-02 NOTE — Assessment & Plan Note (Addendum)
-   Stable/improved; Erythema is mild and within markings. No fevers. Continue Keflex and doxycyline per ED.

## 2022-11-03 ENCOUNTER — Ambulatory Visit: Payer: Medicaid Other | Attending: Family | Admitting: Family

## 2022-11-03 ENCOUNTER — Encounter: Payer: Self-pay | Admitting: Pharmacist

## 2022-11-03 ENCOUNTER — Encounter: Payer: Self-pay | Admitting: Family

## 2022-11-03 VITALS — BP 120/65 | HR 81 | Wt 371.0 lb

## 2022-11-03 DIAGNOSIS — Z7901 Long term (current) use of anticoagulants: Secondary | ICD-10-CM | POA: Diagnosis not present

## 2022-11-03 DIAGNOSIS — Z79899 Other long term (current) drug therapy: Secondary | ICD-10-CM | POA: Diagnosis not present

## 2022-11-03 DIAGNOSIS — Z8673 Personal history of transient ischemic attack (TIA), and cerebral infarction without residual deficits: Secondary | ICD-10-CM | POA: Insufficient documentation

## 2022-11-03 DIAGNOSIS — I5022 Chronic systolic (congestive) heart failure: Secondary | ICD-10-CM

## 2022-11-03 DIAGNOSIS — Z7984 Long term (current) use of oral hypoglycemic drugs: Secondary | ICD-10-CM | POA: Diagnosis not present

## 2022-11-03 DIAGNOSIS — Z87891 Personal history of nicotine dependence: Secondary | ICD-10-CM | POA: Insufficient documentation

## 2022-11-03 DIAGNOSIS — G4700 Insomnia, unspecified: Secondary | ICD-10-CM | POA: Diagnosis not present

## 2022-11-03 DIAGNOSIS — I428 Other cardiomyopathies: Secondary | ICD-10-CM | POA: Diagnosis present

## 2022-11-03 DIAGNOSIS — I110 Hypertensive heart disease with heart failure: Secondary | ICD-10-CM | POA: Diagnosis not present

## 2022-11-03 DIAGNOSIS — E785 Hyperlipidemia, unspecified: Secondary | ICD-10-CM | POA: Diagnosis not present

## 2022-11-03 DIAGNOSIS — K219 Gastro-esophageal reflux disease without esophagitis: Secondary | ICD-10-CM | POA: Insufficient documentation

## 2022-11-03 DIAGNOSIS — I4819 Other persistent atrial fibrillation: Secondary | ICD-10-CM | POA: Insufficient documentation

## 2022-11-03 DIAGNOSIS — K760 Fatty (change of) liver, not elsewhere classified: Secondary | ICD-10-CM | POA: Insufficient documentation

## 2022-11-03 DIAGNOSIS — I480 Paroxysmal atrial fibrillation: Secondary | ICD-10-CM | POA: Diagnosis not present

## 2022-11-03 DIAGNOSIS — E119 Type 2 diabetes mellitus without complications: Secondary | ICD-10-CM | POA: Diagnosis not present

## 2022-11-03 DIAGNOSIS — G4733 Obstructive sleep apnea (adult) (pediatric): Secondary | ICD-10-CM | POA: Insufficient documentation

## 2022-11-03 DIAGNOSIS — I509 Heart failure, unspecified: Secondary | ICD-10-CM | POA: Diagnosis not present

## 2022-11-03 DIAGNOSIS — E669 Obesity, unspecified: Secondary | ICD-10-CM | POA: Diagnosis not present

## 2022-11-03 DIAGNOSIS — G40909 Epilepsy, unspecified, not intractable, without status epilepticus: Secondary | ICD-10-CM | POA: Diagnosis not present

## 2022-11-03 DIAGNOSIS — I10 Essential (primary) hypertension: Secondary | ICD-10-CM

## 2022-11-03 DIAGNOSIS — F259 Schizoaffective disorder, unspecified: Secondary | ICD-10-CM | POA: Diagnosis not present

## 2022-11-03 MED ORDER — WEGOVY 0.25 MG/0.5ML ~~LOC~~ SOAJ: 0.2500 mg | SUBCUTANEOUS | 5 refills | Status: DC

## 2022-11-03 NOTE — Progress Notes (Signed)
Patient ID: Daniel [name], male    DOB: [birth year], 32 y.o.   MRN: [id number]  PCP: Anselm Jungling, NP (at group home) Primary Cardiologist: Yvonne Kendall, MD (last seen 01/24) HF provider: Marca Ancona, MD (last seen 03/24)  HPI:  [name] is a 32 y/o male with a history of DM, hyperlipidemia, HTN, paroxysmal atrial fibrillation, conversion disorder, sleep apnea, schizoaffective disorder, seizure, TIA, urethral stricture, tobacco use and chronic heart failure. Patient has had a long history of paroxysmal AF.  He says this was diagnosed when he was 20 and he was told that it was triggered by excessive use of energy drinks.  He never abused drugs or ETOH per his report though he was a smoker until 5/23.  He was followed in [place] in the past.  There was talk of atrial fibrillation ablation but it was never undertaken. Subsequently, he moved to a group home in [place].    Patient has had home oxygen since 2022 per his report.  High resolution CT chest in 2/24 showed no ILD, possible small airways disease.  PFTs in 2/24 showed minimal obstructive airways disease.  Sleep study showed severe OSA.  Was in the ED 10/31/22 due to cellulitis of left lower leg. Was given antibiotics. Was in the ED 08/21/22 due to syncope while urinating. Syncope thought to be due to vasovagal response. His EKG shows saddleback ST changes in V1 V2 that resolved on recheck. Was in the ED 08/05/22 due to dizziness, chest tightness & LBP. Work up is negative.   Echo 08/22/22: EF 55-60%. Echo 10/22/21: EF of 45-50%. Echo 08/04/21: EF of 50% along with mild LVH  RHC 10/07/22: Hemodynamics (mmHg) RA mean 2 RV 20/2 PA 23/8, mean 15 PCWP mean 3  Oxygen saturations: PA 74% AO 96%  Cardiac Output (Fick) 9.71  Cardiac Index (Fick) 3.34   He presents today for a HF f/u visit with a chief complaint of minimal SOB with moderate exertion. Chronic in nature although he feels like his SOB has improved since last here. Has  associated pedal edema and occasional chest tightness. Improving leg cellulitis and continues on both antibiotics. Denies chest pain, palpitations, dizziness or weight gain.   He did double his furosemide/ potassium for 2 days and is now back to his regular dose.   ROS: All systems negative except as listed in HPI, PMH and Problem List.  SH:  Social History   Socioeconomic History   Marital status: Single    Spouse name: Not on file   Number of children: Not on file   Years of education: Not on file   Highest education level: Not on file  Occupational History   Not on file  Tobacco Use   Smoking status: Former    Years: 2    Types: Cigarettes    Quit date: 08/2021    Years since quitting: 1.1   Smokeless tobacco: Never  Vaping Use   Vaping Use: Never used  Substance and Sexual Activity   Alcohol use: Not Currently   Drug use: Never   Sexual activity: Not on file  Other Topics Concern   Not on file  Social History Narrative   Now living in a group home in [place].   Social Determinants of Health   Financial Resource Strain: Not on file  Food Insecurity: Not on file  Transportation Needs: Not on file  Physical Activity: Not on file  Stress: Not on file  Social Connections: Not on file  Intimate  Partner Violence: Not on file    FH: No family history on file.  Past Medical History:  Diagnosis Date   Cardiomyopathy [facility])    a.) TTE 08/04/2021: EF 50%; b.) TTE 10/22/2021: EF 45-50-%; c.) TTE 08/22/2022: EF 55-60%   Chest pain    CHF (congestive heart failure) (HCC)    a.) TTE 08/04/2021: EF 50%, mild LVH, RVSF norm; b.) TTE 10/22/2021: EF 45-50%, glob HK, RVSF norm; c.) TTE 08/22/2022: EF 55-60%, mild dil LV, RVSF norm.   Conversion disorder    Current use of long term anticoagulation    Dyspnea    GERD (gastroesophageal reflux disease)    Hepatic steatosis    Hyperlipidemia    Hypertension    Insomnia    a.) melatonin PRN   Long term current use of  anticoagulant    a.) apixaban   OSA on CPAP    Persistent atrial fibrillation and flutter (HCC)    a.) CHA2DS2VASc = 5 (CHF, HTN, TIA x 2, T2DM);  b.) rate/rhythm maintained on oral diltiazem + metoprolol succinate; chronically anticoagulated with apixaban   Schizoaffective disorder (HCC)    Seizure disorder (HCC)    T2DM (type 2 diabetes mellitus) (HCC)    TIA (transient ischemic attack)    Urethral stricture    a. 08/2021 s/p cystoscopy and urethral dilation.    Current Outpatient Medications  Medication Sig Dispense Refill   albuterol (VENTOLIN HFA) 108 (90 Base) MCG/ACT inhaler Inhale 2 puffs into the lungs every 6 (six) hours as needed for wheezing. 1 each 2   allopurinol (ZYLOPRIM) 300 MG tablet Take 1 tablet (300 mg total) by mouth daily. 30 tablet 0   atorvastatin (LIPITOR) 80 MG tablet Take 80 mg by mouth daily.     baclofen (LIORESAL) 10 MG tablet Take 1 tablet (10 mg total) by mouth 2 (two) times daily as needed for muscle spasms. Home med. (Patient taking differently: Take 10 mg by mouth 2 (two) times daily. Home med.) 30 each 0   benztropine (COGENTIN) 1 MG tablet Take 1 mg by mouth daily.     cephALEXin (KEFLEX) 500 MG capsule Take 1 capsule (500 mg total) by mouth 4 (four) times daily for 10 days. 40 capsule 0   colchicine 0.6 MG tablet Take 1 tablet (0.6 mg total) by mouth daily for 3 days. 3 tablet 0   cyclobenzaprine (FLEXERIL) 5 MG tablet Take 5 mg by mouth 3 (three) times daily as needed for muscle spasms.     diltiazem (CARDIZEM CD) 360 MG 24 hr capsule Take 1 capsule (360 mg total) by mouth daily. 30 capsule 1   divalproex (DEPAKOTE) 500 MG DR tablet Take 2 tablets (1,000 mg total) by mouth 2 (two) times daily. (Patient taking differently: Take 1,000 mg by mouth daily at 6 (six) AM.)     doxycycline (VIBRAMYCIN) 50 MG capsule Take 1 capsule (50 mg total) by mouth 2 (two) times daily for 10 days. 20 capsule 0   ELIQUIS 5 MG TABS tablet Take 5 mg by mouth 2 (two) times  daily.     escitalopram (LEXAPRO) 10 MG tablet Take 20 mg by mouth daily.     FARXIGA 10 MG TABS tablet TAKE 1 TABLET BY MOUTH ONCE DAILY BEFORE BREAKFAST 28 tablet 10   fluticasone (FLONASE) 50 MCG/ACT nasal spray Place 2 sprays into both nostrils 2 (two) times daily.     furosemide (LASIX) 20 MG tablet Take 1 tablet (20 mg total) by mouth daily.  30 tablet 0   furosemide (LASIX) 20 MG tablet Take 1 tablet (20 mg total) by mouth daily. 2 tablet 0   HYDROcodone-acetaminophen (NORCO) 5-325 MG tablet Take 1 tablet by mouth every 6 (six) hours as needed for up to 3 days for moderate pain. 8 tablet 0   hydrOXYzine (ATARAX) 25 MG tablet Take 25 mg by mouth daily. At noon     hydrOXYzine (ATARAX) 50 MG tablet Take 50 mg by mouth in the morning and at bedtime.     INVEGA 9 MG 24 hr tablet Take 9 mg by mouth every morning.     lithium carbonate (LITHOBID) 300 MG CR tablet Take 600 mg by mouth every evening.     loratadine (CLARITIN) 10 MG tablet Take 10 mg by mouth daily.     melatonin 3 MG TABS tablet Take 3 mg by mouth at bedtime.     metoprolol succinate (TOPROL XL) 100 MG 24 hr tablet Take 1.5 tablets (150 mg total) by mouth daily. Take with or immediately following a meal. 45 tablet 3   omeprazole (PRILOSEC) 20 MG capsule Take 20 mg by mouth daily.     potassium chloride SA (KLOR-CON M) 20 MEQ tablet Take 1 tablet (20 mEq total) by mouth daily. 2 tablet 0   sacubitril-valsartan (ENTRESTO) 24-26 MG Take 1 tablet by mouth 2 (two) times daily. 60 tablet 5   tamsulosin (FLOMAX) 0.4 MG CAPS capsule Take 1 capsule (0.4 mg total) by mouth daily. 30 capsule 2   No current facility-administered medications for this visit.   ReDs: 36%  PHYSICAL EXAM:  General:  Well appearing. No resp difficulty HEENT: normal Neck: supple. JVP flat. No lymphadenopathy or thryomegaly appreciated. Cor: PMI normal. Regular rate & rhythm. No rubs, gallops or murmurs. Lungs: clear Abdomen: soft, nontender, nondistended.  No hepatosplenomegaly. No bruits or masses.  Extremities: no cyanosis, clubbing, rash, trace pitting edema bilateral lower legs Neuro: alert & orientedx3, cranial nerves grossly intact. Moves all 4 extremities w/o difficulty. Affect pleasant.   ECG: not done  Reds: 36%, last visit it was 44%   ASSESSMENT & PLAN:  1: NICM with mildly reduced ejection fraction- - likely due to AF/ OSA - NYHA class II - euvolemic  - weighing daily; reminded to call for an overnight weight gain of > 2 pounds or a weekly weight gain of > 5 pounds - weight down 9 pounds from last visit here 1 week ago - Echo 08/22/22: EF 55-60%.  - Echo 10/22/21: EF of 45-50%.  - Echo 08/04/21: EF of 50% along with mild LVH - RHC 10/07/22:   Hemodynamics (mmHg)   RA mean 2   RV 20/2   PA 23/8, mean 15   PCWP mean 3   Oxygen saturations:   PA 74%   AO 96%   Cardiac Output (Fick) 9.71    Cardiac Index (Fick) 3.34  - he says that he's trying to eat better although has been eating salty potato chips recently - ReDs clip reading today is 36%; last week was 44% - not adding salt and says that he doesn't add salt to his food  - emphasized keeping daily fluid intake to 60 ounces daily  - continue metoprolol succinate 150mg  daily - continue farxiga 10mg  daily - continue entresto 24/26mg  BID - continue furosemide 20mg  daily  - saw HF provider Shirlee Latch) 03/24 - BNP 09/14/22 was 90.8 - PharmD reconciled meds   2: HTN- - BP 120/65 - sees PCP at  the group home  - BMP 10/31/22 reviewed and showed sodium 139, potassium 4.1, creatinine 0.83 and GFR >60  3: Paroxysmal atrial fibrillation- - per his report, onset was around age 44 - saw cardiology ([place]) 01/24 - continue apixaban 5mg  BID - would pursue atrial fibrillation ablation if he has recurrence of atrial fibrillation once he is on bipap and has lost additional weight.   4: Obesity- - A1c 08/05/22 was 5.8% - lifestyle center referral made (at last visit) for  nutrition therapy - will begin wegovy 0.25mg  weekly; reviewed the importance of decreasing fried/ fatty foods to lessen the chance of nausea - increase wegovy to 0.5mg  at next visit if tolerating well  5: Sleep apnea- - saw pulmonology Clent Ridges) 04/24 - Split night sleep study showed severe OSA, AHI 143/hour with severe oxygen desaturations to 63%. Corrected with BIPAP 20/20cm h20 with large full facemask; to be started on bipap but is still waiting on this - wearing oxygen (through [employer]) at 4L at bedtime and occasionally during the day  - pulmonary rehab referral made at last visit and they said they would contact pulmonology for further details  Return in 1 month, sooner if needed.

## 2022-11-09 ENCOUNTER — Other Ambulatory Visit: Payer: Self-pay | Admitting: Cardiology

## 2022-11-14 ENCOUNTER — Other Ambulatory Visit: Payer: Self-pay

## 2022-11-14 ENCOUNTER — Encounter: Payer: Self-pay | Admitting: Emergency Medicine

## 2022-11-14 ENCOUNTER — Emergency Department
Admission: EM | Admit: 2022-11-14 | Discharge: 2022-11-14 | Disposition: A | Discharge status: Home / Self Care | Payer: Medicaid Other | Attending: Emergency Medicine | Admitting: Emergency Medicine

## 2022-11-14 ENCOUNTER — Emergency Department: Payer: Medicaid Other

## 2022-11-14 DIAGNOSIS — M79662 Pain in left lower leg: Secondary | ICD-10-CM | POA: Diagnosis present

## 2022-11-14 DIAGNOSIS — I509 Heart failure, unspecified: Secondary | ICD-10-CM | POA: Insufficient documentation

## 2022-11-14 DIAGNOSIS — I872 Venous insufficiency (chronic) (peripheral): Secondary | ICD-10-CM | POA: Insufficient documentation

## 2022-11-14 DIAGNOSIS — E119 Type 2 diabetes mellitus without complications: Secondary | ICD-10-CM | POA: Insufficient documentation

## 2022-11-14 MED ORDER — ACETAMINOPHEN 325 MG PO TABS
650.0000 mg | ORAL_TABLET | Freq: Once | ORAL | Status: AC
  Administered 2022-11-14: 650 mg via ORAL
  Filled 2022-11-14: qty 2

## 2022-11-14 MED ORDER — MEDICAL COMPRESSION STOCKINGS MISC: 1.0000 | Freq: Once | 0 refills | Status: AC

## 2022-11-14 MED ORDER — ACETAMINOPHEN 325 MG PO TABS: 650.0000 mg | ORAL_TABLET | ORAL | 2 refills | Status: AC | PRN

## 2022-11-14 NOTE — Discharge Instructions (Addendum)
Please wear compression stockings every day.  Try to sleep with your legs elevated. Follow up with orthopedics for your chronic knee pain. You can also go to [facility].

## 2022-11-14 NOTE — ED Provider Notes (Signed)
[facility] Provider Note    Event Date/Time   First MD Initiated Contact with Patient 11/14/22 1256     (approximate)   History   Leg Pain   HPI  Daniel [name] is a 32 y.o. male with PMH of A-fib, cardiomyopathy, schizoaffective disorder, CHF, T2DM among other chronic conditions presents for evaluation of left lower leg pain and right knee pain.  Patient states he was treated for cellulitis of the left lower extremity recently and states he took all of the antibiotic but still having pain.  He describes his knee pain as chronic and states that he was told he had a Baker's cyst at one point.  Denies any specific injury to the knee.  Patient states that he previously broke his left leg and has hardware that he is concerned about being infected.  Patient has a caretaker who is present with him today.      Physical Exam   Triage Vital Signs: ED Triage Vitals  Enc Vitals Group     BP 11/14/22 1228 130/74     Pulse Rate 11/14/22 1228 93     Resp 11/14/22 1228 17     Temp 11/14/22 1228 98 F (36.7 C)     Temp Source 11/14/22 1228 Oral     SpO2 11/14/22 1228 98 %     Weight 11/14/22 1227 (!) 370 lb 6 oz (168 kg)     Height 11/14/22 1244 6\' 5"  (1.956 m)     Head Circumference --      Peak Flow --      Pain Score 11/14/22 1227 5     Pain Loc --      Pain Edu? --      Excl. in GC? --     Most recent vital signs: Vitals:   11/14/22 1228  BP: 130/74  Pulse: 93  Resp: 17  Temp: 98 F (36.7 C)  SpO2: 98%    General: Awake, no distress.  CV:  Good peripheral perfusion.  RRR no murmurs rubs or gallops. Resp:  Normal effort.  CTAB. Abd:  No distention.  Other:  Bilateral lower extremity swelling with overlying skin changes consistent with venous stasis, TTP of Left calf, positive homan's sign, no warmth of LLE, dorsalis pedis pulse 2+ regular, TTP surrounding right patella   ED Results / Procedures / Treatments   Labs (all labs ordered are  listed, but only abnormal results are displayed) Labs Reviewed - No data to display    RADIOLOGY  DVT study completed for left lower extremity.  I interpreted the images as well as reviewed the radiologist report.   PROCEDURES:  Critical Care performed: No  Procedures   MEDICATIONS ORDERED IN ED: Medications  acetaminophen (TYLENOL) tablet 650 mg (650 mg Oral Given 11/14/22 1340)     IMPRESSION / MDM / ASSESSMENT AND PLAN / ED COURSE  I reviewed the triage vital signs and the nursing notes.                              Differential diagnosis includes, but is not limited to, DVT, cellulitis, venous stasis, compartment syndrome, peripheral artery disease.  Patient's presentation is most consistent with acute complicated illness / injury requiring diagnostic workup.  Patient presented for evaluation of left lower extremity pain as well as right knee pain.  Patient was treated recently for cellulitis of the LLE and took all of his  antibiotics but is still having pain.  On exam patient was tender to palpation along the calf so a DVT study was ordered.  I interpreted the images as well as reviewed the radiologist report.  There is no evidence of DVT.  Given patient's bilateral lower extremity swelling and skin changes I believe patient's presentation is most consistent with venous stasis.  There is no evidence of an ulcer or infection at this point.  I believe patient is appropriate for outpatient management.  I advised patient to continue wearing compression stockings and to try to elevate his feet when he sleeps.  I sent a prescription for Tylenol at patient's request for pain management.  I explained that due to my lack of ability to follow-up with him he will want to follow-up with his PCP to consider potential medication changes to help mitigate his symptoms  Since patient's knee pain is chronic in nature I advised patient to continue wearing the knee brace he came in with and to  follow-up with orthopedics.  Patient and caretaker voiced understanding, all questions were answered and patient was stable at discharge.   FINAL CLINICAL IMPRESSION(S) / ED DIAGNOSES   Final diagnoses:  Venous stasis dermatitis of left lower extremity    Rx / DC Orders   ED Discharge Orders          Ordered    acetaminophen (TYLENOL) 325 MG tablet  Every 4 hours PRN        11/14/22 1552    Elastic Bandages & Supports (MEDICAL COMPRESSION STOCKINGS) MISC   Once        11/14/22 1552             Note:  This document was prepared using Dragon voice recognition software and may include unintentional dictation errors.   Cameron Ali, PA-C 11/14/22 1753    Chesley Noon, MD 11/15/22 1019

## 2022-11-14 NOTE — ED Triage Notes (Signed)
Bilateral leg pain

## 2022-11-14 NOTE — ED Notes (Signed)
See triage note  Presents with caregiver   States he is having some discomfort to left leg  Denies any injury  Ambulates well

## 2022-11-14 NOTE — ED Provider Notes (Signed)
[name] Provider Note    Event Date/Time   First MD Initiated Contact with Patient 10/31/22 1225     (approximate)   History   Cellulitis   HPI  Daniel [name] is a 32 y.o. male presenting to the emergency department for evaluation of leg pain.  Last week, patient thought he might have gout as he began having pain in his left great toe.  He was placed on medication, but reports ongoing pain.  He now notes that he has swelling and redness throughout his left leg.  No fevers.  No blistering.      Physical Exam   Triage Vital Signs: ED Triage Vitals  Enc Vitals Group     BP 10/31/22 1107 120/84     Pulse Rate 10/31/22 1107 90     Resp 10/31/22 1107 16     Temp 10/31/22 1107 98.1 F (36.7 C)     Temp Source 10/31/22 1107 Oral     SpO2 10/31/22 1107 96 %     Weight 10/31/22 1108 (!) 377 lb (171 kg)     Height 10/31/22 1108 6\' 5"  (1.956 m)     Head Circumference --      Peak Flow --      Pain Score 10/31/22 1108 9     Pain Loc --      Pain Edu? --      Excl. in GC? --     Most recent vital signs: Vitals:   10/31/22 1107  BP: 120/84  Pulse: 90  Resp: 16  Temp: 98.1 F (36.7 C)  SpO2: 96%     General: Awake, interactive  CV:  Regular rate, good peripheral perfusion.  Resp:  Lungs clear, unlabored respirations.  Abd:  Soft, nondistended.  Neuro:  Symmetric facial movement, fluid speech MSK:  There is erythema of the left lower extremity below the knee into the left foot.  Patient does have some discomfort with active range of motion of his great toe, but is able to be passively ranged without significant discomfort.  2+ DP pulses bilaterally.  ED Results / Procedures / Treatments   Labs (all labs ordered are listed, but only abnormal results are displayed) Labs Reviewed  COMPREHENSIVE METABOLIC PANEL - Abnormal; Notable for the following components:      Result Value   Glucose, Bld 109 (*)    All other components within  normal limits  CBC WITH DIFFERENTIAL/PLATELET  URIC ACID     EKG EKG independently reviewed interpreted by myself (ER attending) demonstrates:    RADIOLOGY Imaging independently reviewed and interpreted by myself demonstrates:  Ultrasound of left lower extremity without evidence of DVT  PROCEDURES:  Critical Care performed: No  Procedures   MEDICATIONS ORDERED IN ED: Medications  HYDROcodone-acetaminophen (NORCO/VICODIN) 5-325 MG per tablet 1 tablet (1 tablet Oral Given 10/31/22 1325)     IMPRESSION / MDM / ASSESSMENT AND PLAN / ED COURSE  I reviewed the triage vital signs and the nursing notes.  Differential diagnosis includes, but is not limited to, cellulitis, DVT, consideration initially for gout though I do not suspect that this is causing the swelling in his calf, presentation not consistent with septic arthritis  Patient's presentation is most consistent with acute presentation with potential threat to life or bodily function.  32 year old male presenting with left lower extremity redness and swelling.  Lab work reassuring including normal white blood cell count.  Uric acid nonspecific, but within normal limits.  Ultrasound  of lower extremity without DVT, does demonstrate Edema.  Suspect patient's presentation reflective of cellulitis.  Reassuring vital signs, lab work, do think patient is reasonable for outpatient management.  Patient comfortable this plan.  Will DC with prescription for Keflex and doxycycline as well as Norco.  Strict return precautions provided.  Patient discharged stable condition.     FINAL CLINICAL IMPRESSION(S) / ED DIAGNOSES   Final diagnoses:  Cellulitis of left lower extremity     Rx / DC Orders   ED Discharge Orders          Ordered    cephALEXin (KEFLEX) 500 MG capsule  4 times daily        10/31/22 1402    doxycycline (VIBRAMYCIN) 50 MG capsule  2 times daily        10/31/22 1402    HYDROcodone-acetaminophen (NORCO) 5-325 MG  tablet  Every 6 hours PRN        10/31/22 1402             Note:  This document was prepared using Dragon voice recognition software and may include unintentional dictation errors.   Trinna Post, MD 11/14/22 [phone]

## 2022-11-16 NOTE — Progress Notes (Signed)
Patient ID: Daniel [name], male   DOB: 12/12/1990, 32 y.o.   MRN: [id number]  [facility] - HEART FAILURE CLINIC - PHARMACIST COUNSELING NOTE  Guideline-Directed Medical Therapy/Evidence Based Medicine  ACE/ARB/ARNI: Sacubitril-valsartan 24-26 mg twice daily Beta Blocker: Metoprolol succinate 150 mg daily Aldosterone Antagonist:  none Diuretic: Furosemide 20 mg daily SGLT2i: Dapagliflozin 10 mg daily  Adherence Assessment  Do you ever forget to take your medication? [] Yes [x] No  Do you ever skip doses due to side effects? [] Yes [x] No  Do you have trouble affording your medicines? [] Yes [x] No  Are you ever unable to pick up your medication due to transportation difficulties? [] Yes [x] No  Do you ever stop taking your medications because you don't believe they are helping? [] Yes [x] No  Do you check your weight daily? [x] Yes [] No   Barriers to obtaining medications: none  Vital signs: HR 81, BP 120/65, weight (pounds) 371 lbs  Echo 08/22/22: EF 55-60%. Echo 10/22/21: EF of 45-50%.  Echo 08/04/21: EF of 50% along with mild LVH   ReDS 36%     Latest Ref Rng & Units 10/31/2022   11:15 AM 10/07/2022    8:51 AM 10/07/2022    8:50 AM  BMP  Glucose 70 - 99 mg/dL 742     BUN 6 - 20 mg/dL 12     Creatinine 5.95 - 1.24 mg/dL 6.38     Sodium 756 - 433 mmol/L 139  138  139   Potassium 3.5 - 5.1 mmol/L 4.1  4.2  4.2   Chloride 98 - 111 mmol/L 100     CO2 22 - 32 mmol/L 30     Calcium 8.9 - 10.3 mg/dL 9.2       Past Medical History:  Diagnosis Date   Cardiomyopathy (HCC)    a.) TTE 08/04/2021: EF 50%; b.) TTE 10/22/2021: EF 45-50-%; c.) TTE 08/22/2022: EF 55-60%   Chest pain    CHF (congestive heart failure) (HCC)    a.) TTE 08/04/2021: EF 50%, mild LVH, RVSF norm; b.) TTE 10/22/2021: EF 45-50%, glob HK, RVSF norm; c.) TTE 08/22/2022: EF 55-60%, mild dil LV, RVSF norm.   Conversion disorder    Current use of long term anticoagulation    Dyspnea    GERD  (gastroesophageal reflux disease)    Hepatic steatosis    Hyperlipidemia    Hypertension    Insomnia    a.) melatonin PRN   Long term current use of anticoagulant    a.) apixaban   OSA on CPAP    Persistent atrial fibrillation and flutter (HCC)    a.) CHA2DS2VASc = 5 (CHF, HTN, TIA x 2, T2DM);  b.) rate/rhythm maintained on oral diltiazem + metoprolol succinate; chronically anticoagulated with apixaban   Schizoaffective disorder (HCC)    Seizure disorder (HCC)    T2DM (type 2 diabetes mellitus) (HCC)    TIA (transient ischemic attack)    Urethral stricture    a. 08/2021 s/p cystoscopy and urethral dilation.    ASSESSMENT 32 year old male who presents to the HF clinic for follow up. PMH includes DM, hyperlipidemia, HTN, paroxysmal atrial fibrillation, conversion disorder, sleep apnea, schizoaffective disorder, seizure, TIA, urethral stricture, tobacco use and chronic heart failure.  Last ED admission was 08/21/22 due to syncope while urinating, and 10/31/22 due to cellulitis. No admissions for HF exacerbation in the the last 6 months. ReDS elevated to 44% last week and adjustment to diuresis made by NP.  MedREc completed during appointment. Copay 4$  for [facility] verified with patietn's advocate.   PLAN Continue low sodium diet. GDMT: unable to add MRA at this time with BP as limiting factor Start Wegovy 0.25mg  [place] weekly and titrate to 2.4mg  if tolerated. ZOXWRU edu and administration teaching completed during visit.  Time spent: 20 minutes   Rodriguez-Guzman, Pharm.D. Clinical Pharmacist 11/16/2022 11:04 AM    Current Outpatient Medications:    acetaminophen (TYLENOL) 325 MG tablet, Take 2 tablets (650 mg total) by mouth every 4 (four) hours as needed., Disp: 100 tablet, Rfl: 2   albuterol (VENTOLIN HFA) 108 (90 Base) MCG/ACT inhaler, Inhale 2 puffs into the lungs every 6 (six) hours as needed for wheezing., Disp: 1 each, Rfl: 2   allopurinol (ZYLOPRIM) 300 MG tablet, Take 1  tablet (300 mg total) by mouth daily., Disp: 30 tablet, Rfl: 0   atorvastatin (LIPITOR) 80 MG tablet, Take 80 mg by mouth daily., Disp: , Rfl:    baclofen (LIORESAL) 10 MG tablet, Take 1 tablet (10 mg total) by mouth 2 (two) times daily as needed for muscle spasms. Home med. (Patient taking differently: Take 10 mg by mouth 2 (two) times daily. Home med.), Disp: 30 each, Rfl: 0   benztropine (COGENTIN) 1 MG tablet, Take 1 mg by mouth daily., Disp: , Rfl:    colchicine 0.6 MG tablet, Take 1 tablet (0.6 mg total) by mouth daily for 3 days., Disp: 3 tablet, Rfl: 0   cyclobenzaprine (FLEXERIL) 5 MG tablet, Take 5 mg by mouth 3 (three) times daily as needed for muscle spasms., Disp: , Rfl:    diltiazem (CARDIZEM CD) 360 MG 24 hr capsule, Take 1 capsule (360 mg total) by mouth daily., Disp: 30 capsule, Rfl: 1   divalproex (DEPAKOTE) 500 MG DR tablet, Take 2 tablets (1,000 mg total) by mouth 2 (two) times daily. (Patient taking differently: Take 1,000 mg by mouth daily at 6 (six) AM.), Disp: , Rfl:    ELIQUIS 5 MG TABS tablet, Take 5 mg by mouth 2 (two) times daily., Disp: , Rfl:    escitalopram (LEXAPRO) 10 MG tablet, Take 20 mg by mouth daily., Disp: , Rfl:    FARXIGA 10 MG TABS tablet, TAKE 1 TABLET BY MOUTH ONCE DAILY BEFORE BREAKFAST, Disp: 28 tablet, Rfl: 10   fluticasone (FLONASE) 50 MCG/ACT nasal spray, Place 2 sprays into both nostrils 2 (two) times daily., Disp: , Rfl:    furosemide (LASIX) 20 MG tablet, Take 1 tablet (20 mg total) by mouth daily., Disp: 30 tablet, Rfl: 0   furosemide (LASIX) 20 MG tablet, Take 1 tablet (20 mg total) by mouth daily., Disp: 2 tablet, Rfl: 0   hydrOXYzine (ATARAX) 25 MG tablet, Take 25 mg by mouth daily. At noon, Disp: , Rfl:    hydrOXYzine (ATARAX) 50 MG tablet, Take 50 mg by mouth in the morning and at bedtime., Disp: , Rfl:    INVEGA 9 MG 24 hr tablet, Take 9 mg by mouth every morning., Disp: , Rfl:    lithium carbonate (LITHOBID) 300 MG CR tablet, Take 600 mg by  mouth every evening., Disp: , Rfl:    loratadine (CLARITIN) 10 MG tablet, Take 10 mg by mouth daily., Disp: , Rfl:    melatonin 3 MG TABS tablet, Take 3 mg by mouth at bedtime., Disp: , Rfl:    metoprolol succinate (TOPROL-XL) 100 MG 24 hr tablet, TAKE 1 AND 1/2 TABLET (150MG ) BY MOUTH ONCE DAILY  WITH OR IMMEDIATELY FOLLOWING A MEAL., Disp: 42 tablet, Rfl:  9   omeprazole (PRILOSEC) 20 MG capsule, Take 20 mg by mouth daily., Disp: , Rfl:    potassium chloride SA (KLOR-CON M) 20 MEQ tablet, Take 1 tablet (20 mEq total) by mouth daily., Disp: 2 tablet, Rfl: 0   sacubitril-valsartan (ENTRESTO) 24-26 MG, Take 1 tablet by mouth 2 (two) times daily., Disp: 60 tablet, Rfl: 5   Semaglutide-Weight Management (WEGOVY) 0.25 MG/0.5ML SOAJ, Inject 0.25 mg into the skin once a week., Disp: 2 mL, Rfl: 5   tamsulosin (FLOMAX) 0.4 MG CAPS capsule, Take 1 capsule (0.4 mg total) by mouth daily., Disp: 30 capsule, Rfl: 2     MEDICATION ADHERENCES TIPS AND STRATEGIES Taking medication as prescribed improves patient outcomes in heart failure (reduces hospitalizations, improves symptoms, increases survival) Side effects of medications can be managed by decreasing doses, switching agents, stopping drugs, or adding additional therapy. Please let someone in the Heart Failure Clinic know if you have having bothersome side effects so we can modify your regimen. Do not alter your medication regimen without talking to [place].  Medication reminders can help patients remember to take drugs on time. If you are missing or forgetting doses you can try linking behaviors, using pill boxes, or an electronic reminder like an alarm on your phone or an app. Some people can also get automated phone calls as medication reminders.

## 2022-11-23 ENCOUNTER — Encounter: Payer: Self-pay | Admitting: Dietician

## 2022-11-23 ENCOUNTER — Encounter: Payer: Medicaid Other | Attending: Family | Admitting: Dietician

## 2022-11-23 DIAGNOSIS — E785 Hyperlipidemia, unspecified: Secondary | ICD-10-CM | POA: Diagnosis not present

## 2022-11-23 DIAGNOSIS — Z6841 Body Mass Index (BMI) 40.0 and over, adult: Secondary | ICD-10-CM | POA: Insufficient documentation

## 2022-11-23 DIAGNOSIS — Z713 Dietary counseling and surveillance: Secondary | ICD-10-CM | POA: Insufficient documentation

## 2022-11-23 DIAGNOSIS — I509 Heart failure, unspecified: Secondary | ICD-10-CM | POA: Insufficient documentation

## 2022-11-23 DIAGNOSIS — I110 Hypertensive heart disease with heart failure: Secondary | ICD-10-CM | POA: Diagnosis not present

## 2022-11-23 DIAGNOSIS — R609 Edema, unspecified: Secondary | ICD-10-CM | POA: Diagnosis not present

## 2022-11-23 DIAGNOSIS — E119 Type 2 diabetes mellitus without complications: Secondary | ICD-10-CM | POA: Diagnosis not present

## 2022-11-23 DIAGNOSIS — M109 Gout, unspecified: Secondary | ICD-10-CM | POA: Diagnosis not present

## 2022-11-23 DIAGNOSIS — G4733 Obstructive sleep apnea (adult) (pediatric): Secondary | ICD-10-CM | POA: Insufficient documentation

## 2022-11-23 NOTE — Patient Instructions (Addendum)
Work towards eating three meals a day, about 5-6 hours apart!  Begin to recognize carbohydrates, proteins, and non-starchy vegetables in your food choices!  Begin to build your meals using the proportions of the Balanced Plate. First, select your carb choice(s) for the meal, Make this 25% of your meal. Next, select your source of protein to pair with your carb choice(s). Make this another 25% of your meal. Finally, complete your meal with a variety of non-starchy vegetables. Make this the remaining 50% of your meal.  Choose fat-free greek yogurt and skim milk for good sources of protein.  Choose sugar free beverages and candies. Have these in moderation.  Continue to heavily moderate your fast food intake. This is a large source of sodium and fat in your diet!!  When shopping, always look for "low sodium", "reduced sodium", or "salt-free" options. Choose unseasoned frozen vegetables for a cheap and healthy option!!

## 2022-11-23 NOTE — Progress Notes (Signed)
Medical Nutrition Therapy  Appointment Start time:  35  Appointment End time:  1145  Primary concerns today: Weight Loss  Referral diagnosis: E66.01 - Morbid Obesity Preferred learning style: No preference indicated Learning readiness: Ready   NUTRITION ASSESSMENT   Anthropometrics  Ht: 6'5.5 Wt: 374.5 lbs BMI: 43.84 kg/m2 Wt Change: -5 lbs in 2 weeks  Clinical Medical Hx: CHF, HTN, HLD, OSA, T2DM, Gout, Edema Medications: See list Labs: A1c - 5.8% (08/05/2022) Notable Signs/Symptoms: Central adiposity  Lifestyle & Dietary Hx Pt caretaker, Sela Hilding, present for appointment Pt reports interest in losing weight, wants to change their diet. Pt reports previously seeing dietitian around 3 years ago, states it did not go well, bad relationship with provider. Pt reports losing ~5 lbs in the past 2 weeks since starting Wegovy, will get nauseated if eating too much. 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 Pt states they will be undergoing an assessment for treatment of knees.   Estimated daily fluid intake: 64 oz Supplements: N/A Sleep: Sleeps poorly, Severe OSA, BiPap may be coming Stress / self-care: Not bad, will color when stressed or listen to music Current average weekly physical activity: Limited to heat, will walk laps at the track occasionally, is up to 1 mile at a time.   24-Hr Dietary Recall Unable to obtain during visit First Meal:  Snack:  Second Meal:  Snack:  Third Meal:  Snack:  Beverages:    NUTRITION DIAGNOSIS  NB-1.1 Food and nutrition-related knowledge deficit As related to Obesity.  As evidenced by BMI of 43.84 kg/m2, .   NUTRITION INTERVENTION  Nutrition education (E-1) on the following topics:  Educated patient on the balanced plate eating model. Recommended lunch and dinner be 1/2 non-starchy vegetables, 1/4 starches, and 1/4 protein. Recommended breakfast be a balance of starch and protein with  a piece of fruit. Discussed with patient the importance of working towards hitting the proportions of the balanced plate consistently. Counseled patient on ways to begin recognizing each of the food groups from the balanced plate in their own meals, and how close they are to fitting the recommended proportions of the balanced plate. Educated patient on the nutritional value of each food group on the balanced plate model.  Educated patient on sources of excess sodium and fat in their food choices, and how it affects their cardiovascular.   Handouts Provided Include  Balanced Plate Balanced Plate Food List  Learning Style & Readiness for Change Teaching method utilized: Visual & Auditory  Demonstrated degree of understanding via: Teach Back  Barriers to learning/adherence to lifestyle change: SES  Goals Established by Pt Work towards eating three meals a day, about 5-6 hours apart! Begin to recognize carbohydrates, proteins, and non-starchy vegetables in your food choices! Begin to build your meals using the proportions of the Balanced Plate. First, select your carb choice(s) for the meal, Make this 25% of your meal. Next, select your source of protein to pair with your carb choice(s). Make this another 25% of your meal. Finally, complete your meal with a variety of non-starchy vegetables. Make this the remaining 50% of your meal. Choose fat-free greek yogurt and skim milk for good sources of protein. Choose sugar free beverages and candies. Have these in moderation. Continue to heavily moderate your fast food intake. This is a large source of sodium and fat in your diet!! When shopping, always look for "low sodium", "reduced sodium", or "salt-free" options. Choose [employer]  frozen vegetables for a cheap and healthy option!!   MONITORING & EVALUATION Dietary intake, weekly physical activity, and weight loss in 2 months.  Next Steps  Patient is to follow up with RD.

## 2022-12-01 ENCOUNTER — Emergency Department: Payer: MEDICAID

## 2022-12-01 ENCOUNTER — Other Ambulatory Visit: Payer: Self-pay

## 2022-12-01 ENCOUNTER — Encounter: Payer: Self-pay | Admitting: *Deleted

## 2022-12-01 ENCOUNTER — Emergency Department
Admission: EM | Admit: 2022-12-01 | Discharge: 2022-12-02 | Disposition: A | Discharge status: Home / Self Care | Payer: MEDICAID | Attending: Emergency Medicine | Admitting: Emergency Medicine

## 2022-12-01 DIAGNOSIS — R110 Nausea: Secondary | ICD-10-CM | POA: Diagnosis not present

## 2022-12-01 DIAGNOSIS — I10 Essential (primary) hypertension: Secondary | ICD-10-CM | POA: Diagnosis not present

## 2022-12-01 DIAGNOSIS — R0602 Shortness of breath: Secondary | ICD-10-CM | POA: Insufficient documentation

## 2022-12-01 DIAGNOSIS — R0600 Dyspnea, unspecified: Secondary | ICD-10-CM | POA: Insufficient documentation

## 2022-12-01 DIAGNOSIS — E119 Type 2 diabetes mellitus without complications: Secondary | ICD-10-CM | POA: Diagnosis not present

## 2022-12-01 DIAGNOSIS — I4891 Unspecified atrial fibrillation: Secondary | ICD-10-CM | POA: Insufficient documentation

## 2022-12-01 LAB — BASIC METABOLIC PANEL
Anion gap: 8 (ref 5–15)
BUN: 15 mg/dL (ref 6–20)
CO2: 27 mmol/L (ref 22–32)
Calcium: 8.8 mg/dL — ABNORMAL LOW (ref 8.9–10.3)
Chloride: 100 mmol/L (ref 98–111)
Creatinine, Ser: 0.92 mg/dL (ref 0.61–1.24)
GFR, Estimated: >60 mL/min (ref >60)
Glucose, Bld: 113 mg/dL — ABNORMAL HIGH (ref 70–99)
Potassium: 3.8 mmol/L (ref 3.5–5.1)
Sodium: 135 mmol/L (ref 135–145)

## 2022-12-01 LAB — CBC
HCT: 41 % (ref 39.0–52.0)
Hemoglobin: 13.2 g/dL (ref 13.0–17.0)
MCH: 27.8 pg (ref 26.0–34.0)
MCHC: 32.2 g/dL (ref 30.0–36.0)
MCV: 86.5 fL (ref 80.0–100.0)
Platelets: 235 10*3/uL (ref 150–400)
RBC: 4.74 MIL/uL (ref 4.22–5.81)
RDW: 14.1 % (ref 11.5–15.5)
WBC: 9.2 10*3/uL (ref 4.0–10.5)
nRBC: 0 % (ref 0.0–0.2)

## 2022-12-01 LAB — BRAIN NATRIURETIC PEPTIDE: B Natriuretic Peptide: 39.7 pg/mL (ref 0.0–100.0)

## 2022-12-01 LAB — TROPONIN I (HIGH SENSITIVITY): Troponin I (High Sensitivity): <2 ng/L (ref <18)

## 2022-12-01 LAB — PROTIME-INR
INR: 1.1 (ref 0.8–1.2)
Prothrombin Time: 14.5 seconds (ref 11.4–15.2)

## 2022-12-01 MED ORDER — ONDANSETRON HCL 4 MG/2ML IJ SOLN
4.0000 mg | Freq: Once | INTRAMUSCULAR | Status: AC
  Administered 2022-12-01: 4 mg via INTRAVENOUS
  Filled 2022-12-01: qty 2

## 2022-12-01 NOTE — ED Triage Notes (Addendum)
Pt ambulatory to triage.  Pt reports sob with nausea.  Pt states pain in chest and a headache.  Sx began today.   Caregiver with pt.  Pt lives at assisted family care.  Pt alert.

## 2022-12-01 NOTE — ED Provider Notes (Signed)
[facility] Provider Note    Event Date/Time   First MD Initiated Contact with Patient 12/01/22 2315     (approximate)   History   Shortness of Breath   HPI  Daniel [name] is a 32 y.o. male who presents to the ED for evaluation of Shortness of Breath   Review pulmonary clinic visit from 1 month ago.  History of A-fib, HTN, cardiomyopathy, OSA, DM, schizoaffective and morbid obesity.  Resides in a local group home, frequent ED visits.  Supplemental O2 at nighttime only. Outpatient right heart cath 2 months ago for dyspnea, essentially normal without cause for dyspnea.  Patient presents with concerns for persistent dyspnea on a chronic timeframe that is intermittent.  Reports he has not yet had his CPAP delivered for his OSA.  Also reports intermittent nausea without abdominal pain or emesis.  Reports feeling bad and has difficulty elaborating on this   Physical Exam   Triage Vital Signs: ED Triage Vitals  Enc Vitals Group     BP 12/01/22 2217 (!) 140/66     Pulse Rate 12/01/22 2217 85     Resp 12/01/22 2217 18     Temp 12/01/22 2217 98.9 F (37.2 C)     Temp Source 12/01/22 2217 Oral     SpO2 12/01/22 2217 95 %     Weight 12/01/22 2215 (!) 375 lb (170.1 kg)     Height 12/01/22 2215 6\' 5"  (1.956 m)     Head Circumference --      Peak Flow --      Pain Score 12/01/22 2215 7     Pain Loc --      Pain Edu? --      Excl. in GC? --     Most recent vital signs: Vitals:   12/01/22 2217 12/02/22 0120  BP: (!) 140/66 (!) 143/83  Pulse: 85 80  Resp: 18 20  Temp: 98.9 F (37.2 C) 98.5 F (36.9 C)  SpO2: 95% 98%    General: Awake, no distress.  CV:  Good peripheral perfusion.  Resp:  Normal effort.  Abd:  No distention.  Soft and benign throughout MSK:  No deformity noted.  Neuro:  No focal deficits appreciated. Other:     ED Results / Procedures / Treatments   Labs (all labs ordered are listed, but only abnormal results are  displayed) Labs Reviewed  BASIC METABOLIC PANEL - Abnormal; Notable for the following components:      Result Value   Glucose, Bld 113 (*)    Calcium 8.8 (*)    All other components within normal limits  CBC  PROTIME-INR  BRAIN NATRIURETIC PEPTIDE  TROPONIN I (HIGH SENSITIVITY)  TROPONIN I (HIGH SENSITIVITY)    EKG Sinus rhythm at a rate of 87 bpm.  Normal axis and intervals.  Nonspecific ST changes without STEMI.  Similar morphology as previous  RADIOLOGY CXR interpreted by me without evidence of acute cardiopulmonary pathology.  Official radiology report(s): DG Chest 2 View  Result Date: 12/01/2022 CLINICAL DATA:  Shortness of breath with nausea, initial encounter EXAM: CHEST - 2 VIEW COMPARISON:  08/21/2022 FINDINGS: The heart size and mediastinal contours are within normal limits. Both lungs are clear. The visualized skeletal structures are unremarkable. IMPRESSION: No acute abnormality noted. Electronically Signed   By: Alcide Clever M.D.   On: 12/01/2022 22:41    PROCEDURES and INTERVENTIONS:  .1-3 Lead EKG Interpretation  Performed by: Delton Prairie, MD Authorized by: Delton Prairie,  MD     Interpretation: normal     ECG rate:  80   ECG rate assessment: normal     Rhythm: sinus rhythm     Ectopy: none     Conduction: normal     Medications  ondansetron (ZOFRAN) injection 4 mg (4 mg Intravenous Given 12/01/22 2332)     IMPRESSION / MDM / ASSESSMENT AND PLAN / ED COURSE  I reviewed the triage vital signs and the nursing notes.  Differential diagnosis includes, but is not limited to, ACS, PTX, PNA, muscle strain/spasm, PE, dissection, anxiety, pleural effusion  {Patient presents with symptoms of an acute illness or injury that is potentially life-threatening.  Patient presents with intermittent chronic symptoms without evidence of acute pathology and suitable for outpatient management.  Looks well to me with a benign exam.  Inconsistent with his history.  Benign workup  with normal basic labs, BNP and 2 negative troponins.  Clear CXR.  Tolerating p.o. intake and suitable for outpatient management.      FINAL CLINICAL IMPRESSION(S) / ED DIAGNOSES   Final diagnoses:  Dyspnea, unspecified type  Nausea     Rx / DC Orders   ED Discharge Orders          Ordered    ondansetron (ZOFRAN-ODT) 4 MG disintegrating tablet  Every 8 hours PRN        12/02/22 0133             Note:  This document was prepared using Dragon voice recognition software and may include unintentional dictation errors.   Delton Prairie, MD 12/02/22 [phone]

## 2022-12-02 LAB — TROPONIN I (HIGH SENSITIVITY): Troponin I (High Sensitivity): 2 ng/L (ref <18)

## 2022-12-02 MED ORDER — ONDANSETRON 4 MG PO TBDP: 4.0000 mg | ORAL_TABLET | Freq: Three times a day (TID) | ORAL | 0 refills | Status: AC | PRN

## 2022-12-02 NOTE — ED Notes (Signed)
Pt given a popsicle/ pt tolerated PO challenge

## 2022-12-04 ENCOUNTER — Other Ambulatory Visit: Payer: Self-pay

## 2022-12-04 ENCOUNTER — Emergency Department
Admission: EM | Admit: 2022-12-04 | Discharge: 2022-12-04 | Disposition: A | Discharge status: Home / Self Care | Payer: MEDICAID | Attending: Emergency Medicine | Admitting: Emergency Medicine

## 2022-12-04 ENCOUNTER — Emergency Department: Payer: MEDICAID

## 2022-12-04 DIAGNOSIS — I110 Hypertensive heart disease with heart failure: Secondary | ICD-10-CM | POA: Diagnosis not present

## 2022-12-04 DIAGNOSIS — Z7902 Long term (current) use of antithrombotics/antiplatelets: Secondary | ICD-10-CM | POA: Diagnosis not present

## 2022-12-04 DIAGNOSIS — E119 Type 2 diabetes mellitus without complications: Secondary | ICD-10-CM | POA: Diagnosis not present

## 2022-12-04 DIAGNOSIS — R040 Epistaxis: Secondary | ICD-10-CM | POA: Insufficient documentation

## 2022-12-04 DIAGNOSIS — W19XXXA Unspecified fall, initial encounter: Secondary | ICD-10-CM | POA: Diagnosis not present

## 2022-12-04 DIAGNOSIS — S0990XA Unspecified injury of head, initial encounter: Secondary | ICD-10-CM | POA: Insufficient documentation

## 2022-12-04 DIAGNOSIS — Y92019 Unspecified place in single-family (private) house as the place of occurrence of the external cause: Secondary | ICD-10-CM | POA: Insufficient documentation

## 2022-12-04 DIAGNOSIS — I509 Heart failure, unspecified: Secondary | ICD-10-CM | POA: Diagnosis not present

## 2022-12-04 LAB — BASIC METABOLIC PANEL
Anion gap: 8 (ref 5–15)
BUN: 10 mg/dL (ref 6–20)
CO2: 28 mmol/L (ref 22–32)
Calcium: 8.8 mg/dL — ABNORMAL LOW (ref 8.9–10.3)
Chloride: 101 mmol/L (ref 98–111)
Creatinine, Ser: 0.69 mg/dL (ref 0.61–1.24)
GFR, Estimated: >60 mL/min (ref >60)
Glucose, Bld: 123 mg/dL — ABNORMAL HIGH (ref 70–99)
Potassium: 3.8 mmol/L (ref 3.5–5.1)
Sodium: 137 mmol/L (ref 135–145)

## 2022-12-04 LAB — CBC
HCT: 41.4 % (ref 39.0–52.0)
Hemoglobin: 13.4 g/dL (ref 13.0–17.0)
MCH: 27.9 pg (ref 26.0–34.0)
MCHC: 32.4 g/dL (ref 30.0–36.0)
MCV: 86.3 fL (ref 80.0–100.0)
Platelets: 215 10*3/uL (ref 150–400)
RBC: 4.8 MIL/uL (ref 4.22–5.81)
RDW: 13.8 % (ref 11.5–15.5)
WBC: 6.3 10*3/uL (ref 4.0–10.5)
nRBC: 0 % (ref 0.0–0.2)

## 2022-12-04 MED ORDER — OXYMETAZOLINE HCL 0.05 % NA SOLN: 2.0000 | Freq: Two times a day (BID) | NASAL | 0 refills | Status: DC

## 2022-12-04 MED ORDER — ACETAMINOPHEN 500 MG PO TABS
1000.0000 mg | ORAL_TABLET | Freq: Once | ORAL | Status: AC
  Administered 2022-12-04: 1000 mg via ORAL
  Filled 2022-12-04: qty 2

## 2022-12-04 NOTE — Discharge Instructions (Addendum)
Follow-up with the heart failure clinic tomorrow as scheduled.  We have also given you referral to [facility]; you will need to call to make an appointment.  Use the Afrin nasal spray twice daily to the right nostril for the next 3 days.  Return to the ER for new, worsening, or persistent severe nosebleed, headaches, weakness or lightheadedness, recurrent falls, or any other new or worsening symptoms that concern you.

## 2022-12-04 NOTE — ED Notes (Signed)
Blue tube sent.

## 2022-12-04 NOTE — ED Triage Notes (Addendum)
First Nurse Note;  Pt via [facility] from [facility]. Pt c/o chronic epistaxis. Pt does take Eliquis. States that he got up and had a syncopal episode when stood up. Pt had a syncopal episode also on Thursday. Pt is A&Ox4 and NAD

## 2022-12-04 NOTE — ED Provider Notes (Signed)
[facility] Provider Note    Event Date/Time   First MD Initiated Contact with Patient 12/04/22 1308     (approximate)   History   Fall   HPI  Daniel [name] is a 32 y.o. male with a history of atrial fibrillation on Eliquis, hypertension, cardiomyopathy and CHF, OSA, diabetes, and schizoaffective disorder who presents from his group home after a fall and nosebleed.  The caregiver from the group home states that he heard a "thud" from the patient's room and came in to find him on the floor having fallen and lost consciousness.  The patient subsequently regained consciousness but was having a nosebleed from his right nare that has subsequently stopped.  The patient states that he has a long history of nosebleeds although it seems that they have escalated in the last few weeks usually coming out of the right nostril.  The patient states that he does not remember exactly what happened or how he fell.  He reports a mild headache currently.  He denies any continued weakness or lightheadedness, shortness of breath, or other acute symptoms.  He does report some sinus pressure although has not had rhinorrhea or congestion.  I reviewed the past medical records.  The patient was seen in the ED on 7/4 for shortness of breath.  His most recent outpatient encounter is from the CHF clinic on 6/6 for follow-up of his CHF.   Physical Exam   Triage Vital Signs: ED Triage Vitals  Enc Vitals Group     BP 12/04/22 1133 124/68     Pulse Rate 12/04/22 1133 84     Resp 12/04/22 1133 20     Temp 12/04/22 1133 98.1 F (36.7 C)     Temp Source 12/04/22 1133 Oral     SpO2 12/04/22 1133 97 %     Weight 12/04/22 1130 (!) 374 lb 12.5 oz (170 kg)     Height 12/04/22 1130 6\' 5"  (1.956 m)     Head Circumference --      Peak Flow --      Pain Score 12/04/22 1129 10     Pain Loc --      Pain Edu? --      Excl. in GC? --     Most recent vital signs: Vitals:   12/04/22 1133  BP:  124/68  Pulse: 84  Resp: 20  Temp: 98.1 F (36.7 C)  SpO2: 97%     General: Awake, no distress.  CV:  Good peripheral perfusion.  Resp:  Normal effort.  Abd:  No distention.  Other:  Right anterior nasal passage with stigmata of recent bleeding, but no active bleeding currently.  Left nasal passage appears normal.  EOMI.  PERRLA.  No facial droop.  Normal speech.  Motor intact in all extremities.  Normal coordination.   ED Results / Procedures / Treatments   Labs (all labs ordered are listed, but only abnormal results are displayed) Labs Reviewed  BASIC METABOLIC PANEL - Abnormal; Notable for the following components:      Result Value   Glucose, Bld 123 (*)    Calcium 8.8 (*)    All other components within normal limits  CBC  URINALYSIS, ROUTINE W REFLEX MICROSCOPIC  CBG MONITORING, ED     EKG  ED ECG REPORT I, Dionne Bucy, the attending physician, personally viewed and interpreted this ECG.  Date: 12/04/2022 EKG Time: 1131 Rate: 89 Rhythm: normal sinus rhythm QRS Axis: normal Intervals: normal  ST/T Wave abnormalities: Nonspecific T wave abnormalities Narrative Interpretation: no evidence of acute ischemia; no significant change when compared EKG of 12/01/2022   RADIOLOGY  CT head: I independently viewed and interpreted the images; there is no ICH.  Radiology report indicates no acute abnormalities.  CT cervical spine: No acute fracture.   PROCEDURES:  Critical Care performed: No  Procedures   MEDICATIONS ORDERED IN ED: Medications  acetaminophen (TYLENOL) tablet 1,000 mg (1,000 mg Oral Given 12/04/22 1354)     IMPRESSION / MDM / ASSESSMENT AND PLAN / ED COURSE  I reviewed the triage vital signs and the nursing notes.  32 year old male with PMH as noted above presents after an unwitnessed fall and subsequent epistaxis that has now resolved.  He has had a recent increase in the frequency of his epistaxis.  Exam is overall unremarkable.  The  patient is well-appearing.  Vital signs are normal.  He has stigmata of recent bleeding in the anterior right nasal passage.  Differential diagnosis includes, but is not limited to:  Fall: This is most likely mechanical.  The patient does not clearly remember what happened but does not describe any lightheadedness or near syncope.  He currently does not feel dizzy.  EKG shows no acute changes.  There is no evidence of concussion.  CT head and cervical spine are negative.  CBC and BMP are normal.  There is no indication for further workup.  Epistaxis: This is consistent with anterior epistaxis.  The patient is on Eliquis.  There have been significant weather changes in the last several days and I suspect this could be contributing as well.  At this time there is no indication for further acute intervention.  I recommend that the patient use Afrin for the next 3 days.  I have given him ENT follow-up given the increased frequency.  The patient does report sinus pressure but sinuses are clear on the CT.  Hemoglobin is normal.  Patient's presentation is most consistent with acute presentation with potential threat to life or bodily function.  I counseled the patient and his caregiver on the results of the workup.  The patient is stable for discharge home at this time.  Return precautions given, and he and the caregiver expressed understanding.   FINAL CLINICAL IMPRESSION(S) / ED DIAGNOSES   Final diagnoses:  Minor head injury, initial encounter  Anterior epistaxis     Rx / DC Orders   ED Discharge Orders          Ordered    oxymetazoline (AFRIN) 0.05 % nasal spray  2 times daily        12/04/22 1336             Note:  This document was prepared using Dragon voice recognition software and may include unintentional dictation errors.    Dionne Bucy, MD 12/04/22 [phone]

## 2022-12-04 NOTE — ED Triage Notes (Signed)
Pt to ED with caregiver from [facility] group home [phone] Pt arrived via [facility]. Caregiver heard a "thud" and found pt on floor. Pt did lose consciousness. Pt takes eliquis. Pt had nosebleed after fall.  Pt states has been having episodes of dizziness recently.  Pt is alert, oriented. Pt has dried blood to face.

## 2022-12-05 ENCOUNTER — Encounter: Payer: Self-pay | Admitting: Family

## 2022-12-05 ENCOUNTER — Other Ambulatory Visit: Payer: Self-pay | Admitting: Cardiology

## 2022-12-05 ENCOUNTER — Ambulatory Visit: Payer: MEDICAID | Attending: Family | Admitting: Family

## 2022-12-05 VITALS — BP 120/65 | HR 84 | Wt 373.4 lb

## 2022-12-05 DIAGNOSIS — G4730 Sleep apnea, unspecified: Secondary | ICD-10-CM | POA: Diagnosis not present

## 2022-12-05 DIAGNOSIS — I5022 Chronic systolic (congestive) heart failure: Secondary | ICD-10-CM

## 2022-12-05 DIAGNOSIS — E669 Obesity, unspecified: Secondary | ICD-10-CM | POA: Insufficient documentation

## 2022-12-05 DIAGNOSIS — I110 Hypertensive heart disease with heart failure: Secondary | ICD-10-CM | POA: Diagnosis not present

## 2022-12-05 DIAGNOSIS — I509 Heart failure, unspecified: Secondary | ICD-10-CM | POA: Diagnosis not present

## 2022-12-05 DIAGNOSIS — I428 Other cardiomyopathies: Secondary | ICD-10-CM | POA: Diagnosis present

## 2022-12-05 DIAGNOSIS — G4733 Obstructive sleep apnea (adult) (pediatric): Secondary | ICD-10-CM | POA: Insufficient documentation

## 2022-12-05 DIAGNOSIS — Z79899 Other long term (current) drug therapy: Secondary | ICD-10-CM | POA: Insufficient documentation

## 2022-12-05 DIAGNOSIS — I480 Paroxysmal atrial fibrillation: Secondary | ICD-10-CM | POA: Diagnosis not present

## 2022-12-05 DIAGNOSIS — Z7901 Long term (current) use of anticoagulants: Secondary | ICD-10-CM | POA: Diagnosis not present

## 2022-12-05 DIAGNOSIS — R45851 Suicidal ideations: Secondary | ICD-10-CM | POA: Diagnosis not present

## 2022-12-05 DIAGNOSIS — I10 Essential (primary) hypertension: Secondary | ICD-10-CM

## 2022-12-05 DIAGNOSIS — R040 Epistaxis: Secondary | ICD-10-CM | POA: Insufficient documentation

## 2022-12-05 NOTE — Progress Notes (Signed)
Patient ID: Daniel [name], male    DOB: 1990-08-09, 32 y.o.   MRN: [id number]  PCP: Anselm Jungling, NP (at group home) Primary Cardiologist: Yvonne Kendall, MD (last seen 01/24) HF provider: Marca Ancona, MD (last seen 03/24)  HPI:  [name] is a 32 y/o male with a history of DM, hyperlipidemia, HTN, paroxysmal atrial fibrillation, conversion disorder, sleep apnea, schizoaffective disorder, seizure, TIA, urethral stricture, tobacco use and chronic heart failure. Patient has had a long history of paroxysmal AF.  He says this was diagnosed when he was 20 and he was told that it was triggered by excessive use of energy drinks.  He never abused drugs or ETOH per his report though he was a smoker until 5/23.  He was followed in [place] in the past.  There was talk of atrial fibrillation ablation but it was never undertaken. Subsequently, he moved to a group home in [place].    Patient has had home oxygen since 2022 per his report.  High resolution CT chest in 2/24 showed no ILD, possible small airways disease.  PFTs in 2/24 showed minimal obstructive airways disease.  Sleep study showed severe OSA.  Was in the ED 12/04/22 due to fall resulting in LOC & nosebleed. Workup negative. Was in the ED 12/01/22 due to SOB & nausea. Workup negative. Was in the ED 11/14/22 due to left lower leg pain and swelling along with right knee pain. Overlying skin changes seem consistent with venous stasis dermatitis, no evidence of cellulitis or abscess. Ultrasound was negative for DVT. Was in the ED 10/31/22 due to cellulitis of left lower leg. Was given antibiotics. Was in the ED 08/21/22 due to syncope while urinating. Syncope thought to be due to vasovagal response. His EKG shows saddleback ST changes in V1 V2 that resolved on recheck. Was in the ED 08/05/22 due to dizziness, chest tightness & LBP. Work up is negative.   Echo 08/22/22: EF 55-60%. Echo 10/22/21: EF of 45-50%. Echo 08/04/21: EF of 50% along with mild  LVH  RHC 10/07/22: Hemodynamics (mmHg) RA mean 2 RV 20/2 PA 23/8, mean 15 PCWP mean 3  Oxygen saturations: PA 74% AO 96%  Cardiac Output (Fick) 9.71  Cardiac Index (Fick) 3.34   He presents today for a HF f/u visit with a chief complaint of minimal SOB with moderate exertion. Chronic in nature. Has associated fatigue, bilateral knee pain, nosebleeds, slight pedal edema and worsening anxiety/ suicidal thoughts (no active plan). Denies chest pain, palpitations, cough, dizziness or difficulty sleeping.   He & caregiver would like to stop the wegovy as patient has noticed an increase in nosebleeds (10 in the last week) as well as increase in his suicidal thoughts since he's been using it. Last dose was 8 days ago. Has met with the nutritionist once and is planning on really focusing on his diet and trying to eat healthier as well as hopefully increase his walking which is limited by his bilateral knee pain. Will be starting PT for this soon.    ROS: All systems negative except as listed in HPI, PMH and Problem List.  SH:  Social History   Socioeconomic History   Marital status: Single    Spouse name: Not on file   Number of children: Not on file   Years of education: Not on file   Highest education level: Not on file  Occupational History   Not on file  Tobacco Use   Smoking status: Former    Years:  2    Types: Cigarettes    Quit date: 08/2021    Years since quitting: 1.2   Smokeless tobacco: Never  Vaping Use   Vaping Use: Never used  Substance and Sexual Activity   Alcohol use: Not Currently   Drug use: Never   Sexual activity: Not on file  Other Topics Concern   Not on file  Social History Narrative   Now living in a group home in [place].   Social Determinants of Health   Financial Resource Strain: Not on file  Food Insecurity: Not on file  Transportation Needs: Not on file  Physical Activity: Not on file  Stress: Not on file  Social Connections: Not on file   Intimate Partner Violence: Not on file    FH: No family history on file.  Past Medical History:  Diagnosis Date   Cardiomyopathy [facility])    a.) TTE 08/04/2021: EF 50%; b.) TTE 10/22/2021: EF 45-50-%; c.) TTE 08/22/2022: EF 55-60%   Chest pain    CHF (congestive heart failure) (HCC)    a.) TTE 08/04/2021: EF 50%, mild LVH, RVSF norm; b.) TTE 10/22/2021: EF 45-50%, glob HK, RVSF norm; c.) TTE 08/22/2022: EF 55-60%, mild dil LV, RVSF norm.   Conversion disorder    Current use of long term anticoagulation    Dyspnea    GERD (gastroesophageal reflux disease)    Hepatic steatosis    Hyperlipidemia    Hypertension    Insomnia    a.) melatonin PRN   Long term current use of anticoagulant    a.) apixaban   OSA on CPAP    Persistent atrial fibrillation and flutter (HCC)    a.) CHA2DS2VASc = 5 (CHF, HTN, TIA x 2, T2DM);  b.) rate/rhythm maintained on oral diltiazem + metoprolol succinate; chronically anticoagulated with apixaban   Schizoaffective disorder (HCC)    Seizure disorder (HCC)    T2DM (type 2 diabetes mellitus) (HCC)    TIA (transient ischemic attack)    Urethral stricture    a. 08/2021 s/p cystoscopy and urethral dilation.    Current Outpatient Medications  Medication Sig Dispense Refill   acetaminophen (TYLENOL) 325 MG tablet Take 2 tablets (650 mg total) by mouth every 4 (four) hours as needed. 100 tablet 2   albuterol (VENTOLIN HFA) 108 (90 Base) MCG/ACT inhaler Inhale 2 puffs into the lungs every 6 (six) hours as needed for wheezing. 1 each 2   allopurinol (ZYLOPRIM) 300 MG tablet Take 1 tablet (300 mg total) by mouth daily. 30 tablet 0   atorvastatin (LIPITOR) 80 MG tablet Take 80 mg by mouth daily.     baclofen (LIORESAL) 10 MG tablet Take 1 tablet (10 mg total) by mouth 2 (two) times daily as needed for muscle spasms. Home med. (Patient taking differently: Take 10 mg by mouth 2 (two) times daily. Home med.) 30 each 0   benztropine (COGENTIN) 1 MG tablet Take 1 mg by  mouth daily.     colchicine 0.6 MG tablet Take 1 tablet (0.6 mg total) by mouth daily for 3 days. 3 tablet 0   cyclobenzaprine (FLEXERIL) 5 MG tablet Take 5 mg by mouth 3 (three) times daily as needed for muscle spasms.     diltiazem (CARDIZEM CD) 360 MG 24 hr capsule Take 1 capsule (360 mg total) by mouth daily. 30 capsule 1   divalproex (DEPAKOTE) 500 MG DR tablet Take 2 tablets (1,000 mg total) by mouth 2 (two) times daily. (Patient taking differently: Take 1,000  mg by mouth daily at 6 (six) AM.)     ELIQUIS 5 MG TABS tablet Take 5 mg by mouth 2 (two) times daily.     escitalopram (LEXAPRO) 10 MG tablet Take 20 mg by mouth daily.     FARXIGA 10 MG TABS tablet TAKE 1 TABLET BY MOUTH ONCE DAILY BEFORE BREAKFAST 28 tablet 10   fluticasone (FLONASE) 50 MCG/ACT nasal spray Place 2 sprays into both nostrils 2 (two) times daily.     furosemide (LASIX) 20 MG tablet Take 1 tablet (20 mg total) by mouth daily. 30 tablet 0   furosemide (LASIX) 20 MG tablet Take 1 tablet (20 mg total) by mouth daily. 2 tablet 0   hydrOXYzine (ATARAX) 25 MG tablet Take 25 mg by mouth daily. At noon     hydrOXYzine (ATARAX) 50 MG tablet Take 50 mg by mouth in the morning and at bedtime.     INVEGA 9 MG 24 hr tablet Take 9 mg by mouth every morning.     lithium carbonate (LITHOBID) 300 MG CR tablet Take 600 mg by mouth every evening.     loratadine (CLARITIN) 10 MG tablet Take 10 mg by mouth daily.     melatonin 3 MG TABS tablet Take 3 mg by mouth at bedtime.     metoprolol succinate (TOPROL-XL) 100 MG 24 hr tablet TAKE 1 AND 1/2 TABLET (150MG ) BY MOUTH ONCE DAILY  WITH OR IMMEDIATELY FOLLOWING A MEAL. 42 tablet 9   omeprazole (PRILOSEC) 20 MG capsule Take 20 mg by mouth daily.     ondansetron (ZOFRAN-ODT) 4 MG disintegrating tablet Take 1 tablet (4 mg total) by mouth every 8 (eight) hours as needed. 20 tablet 0   oxymetazoline (AFRIN) 0.05 % nasal spray Place 2 sprays into right nostril 2 (two) times daily for 3 days. 15 mL  0   potassium chloride SA (KLOR-CON M) 20 MEQ tablet Take 1 tablet (20 mEq total) by mouth daily. 2 tablet 0   sacubitril-valsartan (ENTRESTO) 24-26 MG Take 1 tablet by mouth 2 (two) times daily. 60 tablet 5   Semaglutide-Weight Management (WEGOVY) 0.25 MG/0.5ML SOAJ Inject 0.25 mg into the skin once a week. 2 mL 5   tamsulosin (FLOMAX) 0.4 MG CAPS capsule Take 1 capsule (0.4 mg total) by mouth daily. 30 capsule 2   No current facility-administered medications for this visit.   Vitals:   12/05/22 1008  BP: 120/65  Pulse: 84  SpO2: 94%  Weight: (!) 373 lb 6.4 oz (169.4 kg)   Wt Readings from Last 3 Encounters:  12/05/22 (!) 373 lb 6.4 oz (169.4 kg)  12/04/22 (!) 374 lb 12.5 oz (170 kg)  12/01/22 (!) 375 lb (170.1 kg)   Lab Results  Component Value Date   CREATININE 0.69 12/04/2022   CREATININE 0.92 12/01/2022   CREATININE 0.83 10/31/2022   PHYSICAL EXAM:  General:  Well appearing. No resp difficulty HEENT: normal Neck: supple. JVP flat. No lymphadenopathy or thryomegaly appreciated. Cor: PMI normal. Regular rate & rhythm. No rubs, gallops or murmurs. Lungs: clear Abdomen: soft, nontender, nondistended. No hepatosplenomegaly. No bruits or masses.  Extremities: no cyanosis, clubbing, rash, trace pitting edema left lower legs Neuro: alert & orientedx3, cranial nerves grossly intact. Moves all 4 extremities w/o difficulty. Affect pleasant.   ECG: 12/04/22 showed NSR with HR of 89; unchanged from pervious   ASSESSMENT & PLAN:  1: NICM with mildly reduced ejection fraction- - likely due to AF/ OSA - NYHA class II -  euvolemic  - weighing daily; reminded to call for an overnight weight gain of > 2 pounds or a weekly weight gain of > 5 pounds - weight up 2 pounds from last visit here 1 month ago - Echo 08/22/22: EF 55-60%.  - Echo 10/22/21: EF of 45-50%.  - Echo 08/04/21: EF of 50% along with mild LVH - RHC 10/07/22:   Hemodynamics (mmHg)   RA mean 2   RV 20/2   PA 23/8, mean  15   PCWP mean 3   Oxygen saturations:   PA 74%   AO 96%   Cardiac Output (Fick) 9.71    Cardiac Index (Fick) 3.34  - he says that he's trying to eat better  - not adding salt  - emphasized keeping daily fluid intake to 60 ounces daily  - continue metoprolol succinate 150mg  daily - continue farxiga 10mg  daily - continue entresto 24/26mg  BID - continue furosemide 20mg  daily  - consider adding spironolactone - saw HF provider Shirlee Latch) 03/24 - BNP 12/01/22 was 39.7  2: HTN- - BP 120/65 - sees PCP at the group home  - BMP 12/04/22 reviewed and showed sodium 137, potassium 3.8, creatinine 0.69 and GFR >60  3: Paroxysmal atrial fibrillation- - per his report, onset was around age 50 - saw cardiology ([place]) 01/24 - continue apixaban 5mg  BID - would pursue atrial fibrillation ablation if he has recurrence of atrial fibrillation once he is on bipap and has lost additional weight.   4: Obesity- - A1c 08/05/22 was 5.8% - had medical nutrition visit 11/23/22 & says that he returns in 2 months - due to increase in suicidal thoughts (without active plan) and nosebleeds, will stop wegovy and add this to his allergies  5: Sleep apnea- - saw pulmonology Clent Ridges) 04/24 - Split night sleep study showed severe OSA, AHI 143/hour with severe oxygen desaturations to 63%. Corrected with BIPAP 20/20cm h20 with large full facemask - still waiting on bipap; caregiver continues to call weekly to inquire about this - wearing oxygen (through [employer]) at 4L at bedtime and occasionally during the day    Return in 2 months, sooner if needed.

## 2022-12-05 NOTE — Patient Instructions (Signed)
It was good to see you today!

## 2022-12-15 DIAGNOSIS — M25561 Pain in right knee: Secondary | ICD-10-CM | POA: Insufficient documentation

## 2022-12-15 DIAGNOSIS — M25562 Pain in left knee: Secondary | ICD-10-CM | POA: Insufficient documentation

## 2022-12-15 DIAGNOSIS — R600 Localized edema: Secondary | ICD-10-CM | POA: Insufficient documentation

## 2022-12-16 ENCOUNTER — Ambulatory Visit: Payer: Medicaid Other | Admitting: Primary Care

## 2023-01-04 ENCOUNTER — Ambulatory Visit: Payer: Medicaid Other | Admitting: Urology

## 2023-01-14 ENCOUNTER — Emergency Department: Payer: MEDICAID

## 2023-01-14 ENCOUNTER — Other Ambulatory Visit: Payer: Self-pay

## 2023-01-14 ENCOUNTER — Emergency Department
Admission: EM | Admit: 2023-01-14 | Discharge: 2023-01-15 | Disposition: A | Discharge status: Home / Self Care | Payer: MEDICAID | Attending: Emergency Medicine | Admitting: Emergency Medicine

## 2023-01-14 ENCOUNTER — Encounter: Payer: Self-pay | Admitting: Intensive Care

## 2023-01-14 DIAGNOSIS — I110 Hypertensive heart disease with heart failure: Secondary | ICD-10-CM | POA: Insufficient documentation

## 2023-01-14 DIAGNOSIS — Z7901 Long term (current) use of anticoagulants: Secondary | ICD-10-CM | POA: Insufficient documentation

## 2023-01-14 DIAGNOSIS — R0602 Shortness of breath: Secondary | ICD-10-CM | POA: Diagnosis present

## 2023-01-14 DIAGNOSIS — E119 Type 2 diabetes mellitus without complications: Secondary | ICD-10-CM | POA: Diagnosis not present

## 2023-01-14 DIAGNOSIS — R600 Localized edema: Secondary | ICD-10-CM

## 2023-01-14 DIAGNOSIS — I509 Heart failure, unspecified: Secondary | ICD-10-CM | POA: Insufficient documentation

## 2023-01-14 LAB — COMPREHENSIVE METABOLIC PANEL
ALT: 21 U/L (ref 0–44)
AST: 20 U/L (ref 15–41)
Albumin: 3.8 g/dL (ref 3.5–5.0)
Alkaline Phosphatase: 83 U/L (ref 38–126)
Anion gap: 9 (ref 5–15)
BUN: 16 mg/dL (ref 6–20)
CO2: 28 mmol/L (ref 22–32)
Calcium: 9 mg/dL (ref 8.9–10.3)
Chloride: 98 mmol/L (ref 98–111)
Creatinine, Ser: 0.93 mg/dL (ref 0.61–1.24)
GFR, Estimated: >60 mL/min (ref >60)
Glucose, Bld: 98 mg/dL (ref 70–99)
Potassium: 4.3 mmol/L (ref 3.5–5.1)
Sodium: 135 mmol/L (ref 135–145)
Total Bilirubin: 0.7 mg/dL (ref 0.3–1.2)
Total Protein: 7.4 g/dL (ref 6.5–8.1)

## 2023-01-14 LAB — CBC WITH DIFFERENTIAL/PLATELET
Abs Immature Granulocytes: 0.09 10*3/uL — ABNORMAL HIGH (ref 0.00–0.07)
Basophils Absolute: 0.1 10*3/uL (ref 0.0–0.1)
Basophils Relative: 1 %
Eosinophils Absolute: 0.2 10*3/uL (ref 0.0–0.5)
Eosinophils Relative: 3 %
HCT: 38.4 % — ABNORMAL LOW (ref 39.0–52.0)
Hemoglobin: 12.3 g/dL — ABNORMAL LOW (ref 13.0–17.0)
Immature Granulocytes: 1 %
Lymphocytes Relative: 24 %
Lymphs Abs: 1.8 10*3/uL (ref 0.7–4.0)
MCH: 27.8 pg (ref 26.0–34.0)
MCHC: 32 g/dL (ref 30.0–36.0)
MCV: 86.7 fL (ref 80.0–100.0)
Monocytes Absolute: 0.7 10*3/uL (ref 0.1–1.0)
Monocytes Relative: 9 %
Neutro Abs: 4.8 10*3/uL (ref 1.7–7.7)
Neutrophils Relative %: 62 %
Platelets: 270 10*3/uL (ref 150–400)
RBC: 4.43 MIL/uL (ref 4.22–5.81)
RDW: 13.9 % (ref 11.5–15.5)
WBC: 7.6 10*3/uL (ref 4.0–10.5)
nRBC: 0 % (ref 0.0–0.2)

## 2023-01-14 LAB — URINALYSIS, ROUTINE W REFLEX MICROSCOPIC
Bilirubin Urine: NEGATIVE
Glucose, UA: 50 mg/dL — AB
Hgb urine dipstick: NEGATIVE
Ketones, ur: NEGATIVE mg/dL
Leukocytes,Ua: NEGATIVE
Nitrite: NEGATIVE
Protein, ur: NEGATIVE mg/dL
Specific Gravity, Urine: 1.004 — ABNORMAL LOW (ref 1.005–1.030)
pH: 7 (ref 5.0–8.0)

## 2023-01-14 LAB — TROPONIN I (HIGH SENSITIVITY): Troponin I (High Sensitivity): 3 ng/L (ref <18)

## 2023-01-14 MED ORDER — OXYCODONE-ACETAMINOPHEN 5-325 MG PO TABS
1.0000 | ORAL_TABLET | Freq: Once | ORAL | Status: AC
  Administered 2023-01-14: 1 via ORAL
  Filled 2023-01-14: qty 1

## 2023-01-14 MED ORDER — FUROSEMIDE 10 MG/ML IJ SOLN
40.0000 mg | Freq: Once | INTRAMUSCULAR | Status: AC
  Administered 2023-01-14: 40 mg via INTRAVENOUS
  Filled 2023-01-14: qty 4

## 2023-01-14 MED ORDER — BACLOFEN 10 MG PO TABS
10.0000 mg | ORAL_TABLET | Freq: Once | ORAL | Status: AC
  Administered 2023-01-14: 10 mg via ORAL
  Filled 2023-01-14: qty 1

## 2023-01-14 NOTE — ED Triage Notes (Signed)
Patient c/o shortness of breath that started today and presents with pitting edema bilateral legs.   Hx CHF

## 2023-01-14 NOTE — ED Provider Notes (Signed)
[facility] Provider Note    Event Date/Time   First MD Initiated Contact with Patient 01/14/23 1920     (approximate)   History   Chief Complaint Shortness of Breath and Leg Swelling   HPI  Daniel [name] is a 32 y.o. male with past medical history of hypertension, hyperlipidemia, diabetes, atrial fibrillation, CHF, seizures, and schizoaffective disorder who presents to the ED complaining of shortness of breath.  Patient reports that over the past 2 days he has had increasing swelling in both of his legs along with some difficulty breathing.  He denies any associated fevers or cough, does state that he has had some sharp pain in the center of his chest for the past 2 days.  He currently takes Eliquis and states that he has not missed any doses.  He is concerned that he has an exacerbation of his CHF, states symptoms currently feel similar to prior episodes.     Physical Exam   Triage Vital Signs: ED Triage Vitals  Encounter Vitals Group     BP 01/14/23 1826 (!) 118/57     Systolic BP Percentile --      Diastolic BP Percentile --      Pulse Rate 01/14/23 1826 82     Resp 01/14/23 1826 20     Temp 01/14/23 1826 98.3 F (36.8 C)     Temp Source 01/14/23 1826 Oral     SpO2 01/14/23 1826 95 %     Weight 01/14/23 1827 (!) 366 lb 9.6 oz (166.3 kg)     Height 01/14/23 1827 6\' 5"  (1.956 m)     Head Circumference --      Peak Flow --      Pain Score 01/14/23 1826 10     Pain Loc --      Pain Education --      Exclude from Growth Chart --     Most recent vital signs: Vitals:   01/14/23 2201 01/14/23 2230  BP:  124/77  Pulse: 85 77  Resp: 20 18  Temp:    SpO2: 96% 96%    Constitutional: Alert and oriented. Eyes: Conjunctivae are normal. Head: Atraumatic. Nose: No congestion/rhinnorhea. Mouth/Throat: Mucous membranes are moist.  Cardiovascular: Normal rate, regular rhythm. Grossly normal heart sounds.  2+ radial pulses  bilaterally. Respiratory: Normal respiratory effort.  No retractions. Lungs CTAB. Gastrointestinal: Soft and nontender. No distention. Musculoskeletal: No lower extremity tenderness, 2+ pitting edema to mid shins bilaterally. Neurologic:  Normal speech and language. No gross focal neurologic deficits are appreciated.    ED Results / Procedures / Treatments   Labs (all labs ordered are listed, but only abnormal results are displayed) Labs Reviewed  CBC WITH DIFFERENTIAL/PLATELET - Abnormal; Notable for the following components:      Result Value   Hemoglobin 12.3 (*)    HCT 38.4 (*)    Abs Immature Granulocytes 0.09 (*)    All other components within normal limits  URINALYSIS, ROUTINE W REFLEX MICROSCOPIC - Abnormal; Notable for the following components:   Color, Urine COLORLESS (*)    APPearance CLEAR (*)    Specific Gravity, Urine 1.004 (*)    Glucose, UA 50 (*)    All other components within normal limits  COMPREHENSIVE METABOLIC PANEL  TROPONIN I (HIGH SENSITIVITY)     EKG  ED ECG REPORT I, Chesley Noon, the attending physician, personally viewed and interpreted this ECG.   Date: 01/14/2023  EKG Time: 18:29  Rate:  79  Rhythm: normal sinus rhythm  Axis: Normal  Intervals:none  ST&T Change: None  RADIOLOGY CXR reviewed and interpreted by me with no infiltrate, edema, or effusion.  PROCEDURES:  Critical Care performed: No  Procedures   MEDICATIONS ORDERED IN ED: Medications  baclofen (LIORESAL) tablet 10 mg (has no administration in time range)  furosemide (LASIX) injection 40 mg (40 mg Intravenous Given 01/14/23 2043)  oxyCODONE-acetaminophen (PERCOCET/ROXICET) 5-325 MG per tablet 1 tablet (1 tablet Oral Given 01/14/23 2036)     IMPRESSION / MDM / ASSESSMENT AND PLAN / ED COURSE  I reviewed the triage vital signs and the nursing notes.                              32 y.o. male with past medical history of hypertension, hyperlipidemia, diabetes,  atrial fibrillation, CHF, seizures, and schizoaffective disorder who presents to the ED with increasing lower extremity swelling and difficulty breathing over the past 2 days.  Patient's presentation is most consistent with acute presentation with potential threat to life or bodily function.  Differential diagnosis includes, but is not limited to, CHF exacerbation, ACS, PE, pneumonia, pneumothorax, anemia, electrolyte abnormality, AKI.  Patient chronically ill but nontoxic-appearing and in no acute distress, vital signs are unremarkable.  He has lower extremity edema but is not in any respiratory distress and maintaining oxygen saturations.  EKG shows no evidence of arrhythmia or ischemia and troponin within normal limits, chest x-ray negative for acute process.  No significant anemia, leukocytosis, tract abnormality, or AKI noted.  Patient does appear to be retaining fluid in his legs and we will give dose of IV Lasix.  Urinalysis with no signs of infection, patient continues to breathe comfortably here in the ED following diuresis.  He is appropriate for discharge home with outpatient follow-up at the CHF clinic.  He was counseled to return to the ED for new or worsening symptoms.  Patient agrees with plan.      FINAL CLINICAL IMPRESSION(S) / ED DIAGNOSES   Final diagnoses:  Peripheral edema  Shortness of breath     Rx / DC Orders   ED Discharge Orders          Ordered    AMB referral to CHF clinic        01/14/23 2243             Note:  This document was prepared using Dragon voice recognition software and may include unintentional dictation errors.   Chesley Noon, MD 01/14/23 2245

## 2023-01-14 NOTE — ED Notes (Signed)
This RN called and spoke with Jodi Geralds, Director of [employer], [phone]. States will arrange for pickup from caregiver at [employer] to come and pick patient up tonight, will notify ED if anything changes regarding ability to pick patient up tonight.

## 2023-01-15 MED ORDER — ACETAMINOPHEN 325 MG PO TABS
650.0000 mg | ORAL_TABLET | Freq: Once | ORAL | Status: AC
  Administered 2023-01-15: 650 mg via ORAL
  Filled 2023-01-15: qty 2

## 2023-01-23 ENCOUNTER — Emergency Department
Admission: EM | Admit: 2023-01-23 | Discharge: 2023-01-23 | Disposition: A | Discharge status: Home / Self Care | Payer: MEDICAID | Attending: Emergency Medicine | Admitting: Emergency Medicine

## 2023-01-23 ENCOUNTER — Other Ambulatory Visit: Payer: Self-pay

## 2023-01-23 ENCOUNTER — Emergency Department: Payer: MEDICAID

## 2023-01-23 DIAGNOSIS — I509 Heart failure, unspecified: Secondary | ICD-10-CM | POA: Diagnosis not present

## 2023-01-23 DIAGNOSIS — I110 Hypertensive heart disease with heart failure: Secondary | ICD-10-CM | POA: Insufficient documentation

## 2023-01-23 DIAGNOSIS — R600 Localized edema: Secondary | ICD-10-CM | POA: Insufficient documentation

## 2023-01-23 DIAGNOSIS — Z20822 Contact with and (suspected) exposure to covid-19: Secondary | ICD-10-CM | POA: Insufficient documentation

## 2023-01-23 DIAGNOSIS — R0602 Shortness of breath: Secondary | ICD-10-CM | POA: Insufficient documentation

## 2023-01-23 LAB — CBC
HCT: 41.5 % (ref 39.0–52.0)
Hemoglobin: 13.5 g/dL (ref 13.0–17.0)
MCH: 28.1 pg (ref 26.0–34.0)
MCHC: 32.5 g/dL (ref 30.0–36.0)
MCV: 86.5 fL (ref 80.0–100.0)
Platelets: 245 10*3/uL (ref 150–400)
RBC: 4.8 MIL/uL (ref 4.22–5.81)
RDW: 14.3 % (ref 11.5–15.5)
WBC: 7.2 10*3/uL (ref 4.0–10.5)
nRBC: 0 % (ref 0.0–0.2)

## 2023-01-23 LAB — URINALYSIS, ROUTINE W REFLEX MICROSCOPIC
Bilirubin Urine: NEGATIVE
Glucose, UA: >=500 mg/dL — AB
Hgb urine dipstick: NEGATIVE
Ketones, ur: NEGATIVE mg/dL
Nitrite: NEGATIVE
Protein, ur: NEGATIVE mg/dL
Specific Gravity, Urine: 1.008 (ref 1.005–1.030)
pH: 7 (ref 5.0–8.0)

## 2023-01-23 LAB — RESP PANEL BY RT-PCR (RSV, FLU A&B, COVID)  RVPGX2
Influenza A by PCR: NEGATIVE
Influenza B by PCR: NEGATIVE
Resp Syncytial Virus by PCR: NEGATIVE
SARS Coronavirus 2 by RT PCR: NEGATIVE

## 2023-01-23 LAB — BASIC METABOLIC PANEL
Anion gap: 9 (ref 5–15)
BUN: 16 mg/dL (ref 6–20)
CO2: 28 mmol/L (ref 22–32)
Calcium: 9 mg/dL (ref 8.9–10.3)
Chloride: 98 mmol/L (ref 98–111)
Creatinine, Ser: 0.95 mg/dL (ref 0.61–1.24)
GFR, Estimated: >60 mL/min (ref >60)
Glucose, Bld: 103 mg/dL — ABNORMAL HIGH (ref 70–99)
Potassium: 4.3 mmol/L (ref 3.5–5.1)
Sodium: 135 mmol/L (ref 135–145)

## 2023-01-23 LAB — TROPONIN I (HIGH SENSITIVITY): Troponin I (High Sensitivity): 3 ng/L (ref <18)

## 2023-01-23 MED ORDER — POTASSIUM CHLORIDE CRYS ER 20 MEQ PO TBCR
40.0000 meq | EXTENDED_RELEASE_TABLET | Freq: Once | ORAL | Status: AC
  Administered 2023-01-23: 40 meq via ORAL
  Filled 2023-01-23: qty 2

## 2023-01-23 MED ORDER — POTASSIUM CHLORIDE CRYS ER 20 MEQ PO TBCR: 20.0000 meq | EXTENDED_RELEASE_TABLET | Freq: Every day | ORAL | 0 refills | Status: DC

## 2023-01-23 MED ORDER — FUROSEMIDE 40 MG PO TABS
40.0000 mg | ORAL_TABLET | Freq: Once | ORAL | Status: AC
  Administered 2023-01-23: 40 mg via ORAL
  Filled 2023-01-23: qty 1

## 2023-01-23 NOTE — ED Notes (Signed)
Attempted to contact legal guardian listed in chart, no answer. Pt reports she knows he is here

## 2023-01-23 NOTE — ED Notes (Signed)
Pt is concerned about his new weight gain, says he has heart failure. He was drinking water and had him stop when I entered the room. [employer] at bedside.

## 2023-01-23 NOTE — ED Triage Notes (Signed)
Pt to ED for bilateral leg swelling since last weekend. Reports pain with urination. Has been taking lasix as prescribed. Also reports chest pain started this afternoon.

## 2023-01-23 NOTE — ED Notes (Signed)
 Pt denied being able to produce a urine sample at this time. Pt provided with a labeled specimen cup and instructions to return cup to triage nurse desk once it has a clean catch urine sample.

## 2023-01-23 NOTE — ED Provider Notes (Signed)
[facility] Provider Note    Event Date/Time   First MD Initiated Contact with Patient 01/23/23 1945     (approximate)   History   Chief Complaint: fluid retention and Chest Pain   HPI  Daniel [name] is a 32 y.o. male with a history of morbid obesity, chronic atrial fibrillation, CHF, schizoaffective disorder who comes to the ED from his group home due to reporting shortness of breath and chest pain that started earlier today.  Also complains of swelling in bilateral legs.  These are chronic issues for him for which she has been evaluated frequently in the ED by myself and others.  Taking his medications.  No fever or cough, no pleuritic or exertional pain.     Physical Exam   Triage Vital Signs: ED Triage Vitals  Encounter Vitals Group     BP 01/23/23 1737 138/87     Systolic BP Percentile --      Diastolic BP Percentile --      Pulse Rate 01/23/23 1737 75     Resp 01/23/23 1740 20     Temp 01/23/23 1735 98.1 F (36.7 C)     Temp src --      SpO2 01/23/23 1737 99 %     Weight 01/23/23 1736 (!) 374 lb (169.6 kg)     Height 01/23/23 1736 6\' 5"  (1.956 m)     Head Circumference --      Peak Flow --      Pain Score 01/23/23 1736 10     Pain Loc --      Pain Education --      Exclude from Growth Chart --     Most recent vital signs: Vitals:   01/23/23 1740 01/23/23 2000  BP:  133/75  Pulse:  81  Resp: 20   Temp:    SpO2:  100%    General: Awake, no distress.  CV:  Good peripheral perfusion.  Regular rate and rhythm Resp:  Normal effort.  Clear to auscultation bilaterally, no wheezing or crackles Abd:  No distention.  Other:  Trace pitting edema bilateral lower extremities.  Symmetric calf circumference.  No calf tenderness.   ED Results / Procedures / Treatments   Labs (all labs ordered are listed, but only abnormal results are displayed) Labs Reviewed  BASIC METABOLIC PANEL - Abnormal; Notable for the following components:       Result Value   Glucose, Bld 103 (*)    All other components within normal limits  URINALYSIS, ROUTINE W REFLEX MICROSCOPIC - Abnormal; Notable for the following components:   Color, Urine STRAW (*)    APPearance HAZY (*)    Glucose, UA >=500 (*)    Leukocytes,Ua TRACE (*)    Bacteria, UA RARE (*)    All other components within normal limits  RESP PANEL BY RT-PCR (RSV, FLU A&B, COVID)  [id number]  CBC  TROPONIN I (HIGH SENSITIVITY)  TROPONIN I (HIGH SENSITIVITY)     EKG Interpreted by me Normal sinus rhythm rate of 72.  Normal axis, normal intervals.  Normal QRS ST segments and T waves.   RADIOLOGY Chest x-ray interpreted by me, appears normal.  Radiology report reviewed   PROCEDURES:  Procedures   MEDICATIONS ORDERED IN ED: Medications  furosemide (LASIX) tablet 40 mg (40 mg Oral Given 01/23/23 2014)  potassium chloride SA (KLOR-CON M) CR tablet 40 mEq (40 mEq Oral Given 01/23/23 2015)     IMPRESSION / MDM /  ASSESSMENT AND PLAN / ED COURSE  I reviewed the triage vital signs and the nursing notes.  DDx: Pleural effusion, pneumonia, pulmonary edema, anxiety, mild fluid overload, electrolyte abnormality, anemia, UTI, COVID, flu, NSTEMI  Patient's presentation is most consistent with acute presentation with potential threat to life or bodily function.  Patient presents with atypical chest pain and shortness of breath complaints.  These are recurrent issues for him, he is nontoxic with reassuring exam, EKG chest x-ray labs and vital signs.  Will give increased dose of his Lasix due to weight being above prior baseline, have him continue a higher dose for the next few days while following up with heart failure clinic.  Does not require hospitalization.   Considering the patient's symptoms, medical history, and physical examination today, I have low suspicion for ACS, PE, TAD, pneumothorax, carditis, mediastinitis, pneumonia, CHF, or sepsis.        FINAL CLINICAL  IMPRESSION(S) / ED DIAGNOSES   Final diagnoses:  SOB (shortness of breath)  Chronic congestive heart failure, unspecified heart failure type (HCC)     Rx / DC Orders   ED Discharge Orders          Ordered    potassium chloride SA (KLOR-CON M) 20 MEQ tablet  Daily        01/23/23 2010             Note:  This document was prepared using Dragon voice recognition software and may include unintentional dictation errors.   Sharman Cheek, MD 01/23/23 2326

## 2023-01-23 NOTE — Discharge Instructions (Signed)
Increase lasix to 20mg  two times a day for the next 3 days and follow up with the heart failure clinic this week for further evaluation.  Take a potassium supplement once a day during this time.

## 2023-02-01 ENCOUNTER — Ambulatory Visit: Payer: MEDICAID | Admitting: Urology

## 2023-02-01 ENCOUNTER — Encounter: Payer: Self-pay | Admitting: Urology

## 2023-02-01 VITALS — BP 104/71 | Ht 76.0 in | Wt 374.0 lb

## 2023-02-01 DIAGNOSIS — Z87448 Personal history of other diseases of urinary system: Secondary | ICD-10-CM

## 2023-02-01 DIAGNOSIS — R300 Dysuria: Secondary | ICD-10-CM | POA: Diagnosis not present

## 2023-02-01 LAB — URINALYSIS, COMPLETE
Bilirubin, UA: NEGATIVE
Ketones, UA: NEGATIVE
Leukocytes,UA: NEGATIVE
Nitrite, UA: NEGATIVE
Protein,UA: NEGATIVE
RBC, UA: NEGATIVE
Specific Gravity, UA: 1.015 (ref 1.005–1.030)
Urobilinogen, Ur: 0.2 mg/dL (ref 0.2–1.0)
pH, UA: 7 (ref 5.0–7.5)

## 2023-02-01 LAB — MICROSCOPIC EXAMINATION: Bacteria, UA: NONE SEEN

## 2023-02-01 LAB — BLADDER SCAN AMB NON-IMAGING: PVR: 0 WU

## 2023-02-01 NOTE — Progress Notes (Signed)
I,Daniel [name],acting as a scribe for Daniel Altes, MD.,have documented all relevant documentation on the behalf of Daniel Altes, MD,as directed by  Daniel Altes, MD while in the presence of Daniel Altes, MD.  02/01/2023 8:18 PM   [name] [birth year] [phone]  Referring provider: Anselm Jungling, NP [address] [address] [name],  [place] [address]  Chief Complaint  Patient presents with   Other    HPI: 32 y.o. male presents for follow-up.  Status post balloon dilation, uretheral stricture, April 2024. Notes occasional urinary hesitancy, which is intermittent. ED visit 01/23/23 for chest pain and shortness of breath. Urinalysis showed 6-10 WBC and 6-10 squamous epithelial cells. He was treated for a UTI, though a urine culture was not ordered. Denies gross hematuria.   PMH: Past Medical History:  Diagnosis Date   Cardiomyopathy (HCC)    a.) TTE 08/04/2021: EF 50%; b.) TTE 10/22/2021: EF 45-50-%; c.) TTE 08/22/2022: EF 55-60%   Chest pain    CHF (congestive heart failure) (HCC)    a.) TTE 08/04/2021: EF 50%, mild LVH, RVSF norm; b.) TTE 10/22/2021: EF 45-50%, glob HK, RVSF norm; c.) TTE 08/22/2022: EF 55-60%, mild dil LV, RVSF norm.   Conversion disorder    Current use of long term anticoagulation    Dyspnea    GERD (gastroesophageal reflux disease)    Hepatic steatosis    Hyperlipidemia    Hypertension    Insomnia    a.) melatonin PRN   Long term current use of anticoagulant    a.) apixaban   OSA on CPAP    Persistent atrial fibrillation and flutter (HCC)    a.) CHA2DS2VASc = 5 (CHF, HTN, TIA x 2, T2DM);  b.) rate/rhythm maintained on oral diltiazem + metoprolol succinate; chronically anticoagulated with apixaban   Schizoaffective disorder (HCC)    Seizure disorder (HCC)    T2DM (type 2 diabetes mellitus) (HCC)    TIA (transient ischemic attack)    Urethral stricture    a. 08/2021 s/p cystoscopy and urethral dilation.    Surgical History: Past Surgical  History:  Procedure Laterality Date   CYSTOSCOPY WITH URETHRAL DILATATION N/A 09/07/2021   Procedure: CYSTOSCOPY WITH URETHRAL DILATATION CATHETER PLACEMENT;  Surgeon: Daniel Altes, MD;  Location: [facility] ORS;  Service: Urology;  Laterality: N/A;   CYSTOSCOPY WITH URETHRAL DILATATION N/A 09/06/2022   Procedure: CYSTOSCOPY WITH URETHRAL BALLOON DILATATION USING OPTILUME;  Surgeon: Daniel Altes, MD;  Location: [facility] ORS;  Service: Urology;  Laterality: N/A;   RIGHT HEART CATH Right 10/07/2022   Procedure: RIGHT HEART CATH;  Surgeon: Laurey Morale, MD;  Location: [facility] INVASIVE CV LAB;  Service: Cardiovascular;  Laterality: Right;    Home Medications:  Allergies as of 02/01/2023       Reactions   Haldol [haloperidol] Other (See Comments)   SI   Semaglutide Anxiety   Increase in suicidal thoughts   Dermatitis Antigen Rash   Meperidine    Meperidine   Abilify [aripiprazole] Palpitations   Demerol [meperidine Hcl] Hives   Tape Rash   Other reaction(s): Other (See Comments) Itching, skin tear        Medication List        Accurate as of February 01, 2023  8:18 PM. If you have any questions, ask your nurse or doctor.          STOP taking these medications    colchicine 0.6 MG tablet Stopped by: Daniel [name]  TAKE these medications    acetaminophen 325 MG tablet Commonly known as: Tylenol Take 2 tablets (650 mg total) by mouth every 4 (four) hours as needed.   albuterol 108 (90 Base) MCG/ACT inhaler Commonly known as: VENTOLIN HFA Inhale 2 puffs into the lungs every 6 (six) hours as needed for wheezing.   allopurinol 300 MG tablet Commonly known as: ZYLOPRIM Take 1 tablet (300 mg total) by mouth daily.   atorvastatin 80 MG tablet Commonly known as: LIPITOR Take 80 mg by mouth daily.   baclofen 10 MG tablet Commonly known as: LIORESAL Take 1 tablet (10 mg total) by mouth 2 (two) times daily as needed for muscle spasms. Home med. What changed: when to  take this   benztropine 1 MG tablet Commonly known as: COGENTIN Take 1 mg by mouth daily.   cyclobenzaprine 5 MG tablet Commonly known as: FLEXERIL Take 5 mg by mouth 3 (three) times daily as needed for muscle spasms.   diltiazem 360 MG 24 hr capsule Commonly known as: CARDIZEM CD Take 1 capsule (360 mg total) by mouth daily.   divalproex 500 MG DR tablet Commonly known as: DEPAKOTE Take 2 tablets (1,000 mg total) by mouth 2 (two) times daily. What changed: when to take this   doxycycline 100 MG tablet Commonly known as: VIBRA-TABS Take 100 mg by mouth 2 (two) times daily.   Eliquis 5 MG Tabs tablet Generic drug: apixaban Take 5 mg by mouth 2 (two) times daily.   Entresto 24-26 MG Generic drug: sacubitril-valsartan TAKE 1 TABLET BY MOUTH TWICE DAILY   escitalopram 10 MG tablet Commonly known as: LEXAPRO Take 20 mg by mouth daily.   Farxiga 10 MG Tabs tablet Generic drug: dapagliflozin propanediol TAKE 1 TABLET BY MOUTH ONCE DAILY BEFORE BREAKFAST   fluticasone 50 MCG/ACT nasal spray Commonly known as: FLONASE Place 2 sprays into both nostrils 2 (two) times daily.   furosemide 20 MG tablet Commonly known as: LASIX Take 1 tablet (20 mg total) by mouth daily.   hydrOXYzine 50 MG tablet Commonly known as: ATARAX Take 50 mg by mouth in the morning and at bedtime.   hydrOXYzine 25 MG tablet Commonly known as: ATARAX Take 25 mg by mouth daily. At noon   Invega 9 MG 24 hr tablet Generic drug: paliperidone Take 9 mg by mouth every morning.   lithium carbonate 300 MG ER tablet Commonly known as: LITHOBID Take 600 mg by mouth every evening.   loratadine 10 MG tablet Commonly known as: CLARITIN Take 10 mg by mouth daily.   melatonin 3 MG Tabs tablet Take 3 mg by mouth at bedtime.   metoprolol succinate 100 MG 24 hr tablet Commonly known as: TOPROL-XL TAKE 1 AND 1/2 TABLET (150MG ) BY MOUTH ONCE DAILY  WITH OR IMMEDIATELY FOLLOWING A MEAL.   omeprazole 20  MG capsule Commonly known as: PRILOSEC Take 20 mg by mouth daily.   ondansetron 4 MG disintegrating tablet Commonly known as: ZOFRAN-ODT Take 1 tablet (4 mg total) by mouth every 8 (eight) hours as needed.   potassium chloride SA 20 MEQ tablet Commonly known as: KLOR-CON M Take 1 tablet (20 mEq total) by mouth daily for 3 days.   tamsulosin 0.4 MG Caps capsule Commonly known as: FLOMAX Take 1 capsule (0.4 mg total) by mouth daily.        Allergies:  Allergies  Allergen Reactions   Haldol [Haloperidol] Other (See Comments)    SI   Semaglutide Anxiety  Increase in suicidal thoughts   Dermatitis Antigen Rash   Meperidine     Meperidine   Abilify [Aripiprazole] Palpitations   Demerol [Meperidine Hcl] Hives   Tape Rash    Other reaction(s): Other (See Comments) Itching, skin tear    Family History: History reviewed. No pertinent family history.  Social History:  reports that he quit smoking about 17 months ago. His smoking use included cigarettes. He started smoking about 3 years ago. He has never used smokeless tobacco. He reports that he does not currently use alcohol. He reports that he does not use drugs.   Physical Exam: BP 104/71   Ht 6\' 4"  (1.93 m)   Wt (!) 374 lb (169.6 kg)   BMI 45.52 kg/m   Constitutional:  Alert,, No acute distress.   Laboratory Data: Urinalysis:  Dipstick 2+glucose, microscopy negative.    Assessment & Plan:    1. History of urethral stricture Recent urinalysis with pyuria; treated for UTI though urine culture not ordered. UA today clear. Schedule office cystoscopy to evaluate for recurrent stricture.  I have reviewed the above documentation for accuracy and completeness, and I agree with the above.   Daniel Altes, MD  [facility] [address] [place], [place] [address] [phone]

## 2023-02-02 ENCOUNTER — Emergency Department: Payer: MEDICAID

## 2023-02-02 ENCOUNTER — Other Ambulatory Visit: Payer: Self-pay

## 2023-02-02 ENCOUNTER — Emergency Department
Admission: EM | Admit: 2023-02-02 | Discharge: 2023-02-02 | Disposition: A | Discharge status: Other Acute Inpatient Hospital | Payer: MEDICAID | Attending: Emergency Medicine | Admitting: Emergency Medicine

## 2023-02-02 DIAGNOSIS — G43409 Hemiplegic migraine, not intractable, without status migrainosus: Secondary | ICD-10-CM | POA: Insufficient documentation

## 2023-02-02 DIAGNOSIS — Z7901 Long term (current) use of anticoagulants: Secondary | ICD-10-CM | POA: Insufficient documentation

## 2023-02-02 DIAGNOSIS — R531 Weakness: Secondary | ICD-10-CM

## 2023-02-02 LAB — CBC
HCT: 39.4 % (ref 39.0–52.0)
Hemoglobin: 12.7 g/dL — ABNORMAL LOW (ref 13.0–17.0)
MCH: 27.7 pg (ref 26.0–34.0)
MCHC: 32.2 g/dL (ref 30.0–36.0)
MCV: 86 fL (ref 80.0–100.0)
Platelets: 225 10*3/uL (ref 150–400)
RBC: 4.58 MIL/uL (ref 4.22–5.81)
RDW: 14.2 % (ref 11.5–15.5)
WBC: 6.8 10*3/uL (ref 4.0–10.5)
nRBC: 0 % (ref 0.0–0.2)

## 2023-02-02 LAB — DIFFERENTIAL
Abs Immature Granulocytes: 0.03 10*3/uL (ref 0.00–0.07)
Basophils Absolute: 0 10*3/uL (ref 0.0–0.1)
Basophils Relative: 0 %
Eosinophils Absolute: 0.2 10*3/uL (ref 0.0–0.5)
Eosinophils Relative: 2 %
Immature Granulocytes: 0 %
Lymphocytes Relative: 25 %
Lymphs Abs: 1.7 10*3/uL (ref 0.7–4.0)
Monocytes Absolute: 0.8 10*3/uL (ref 0.1–1.0)
Monocytes Relative: 11 %
Neutro Abs: 4.1 10*3/uL (ref 1.7–7.7)
Neutrophils Relative %: 62 %

## 2023-02-02 LAB — CBG MONITORING, ED: Glucose-Capillary: 106 mg/dL — ABNORMAL HIGH (ref 70–99)

## 2023-02-02 LAB — COMPREHENSIVE METABOLIC PANEL
ALT: 22 U/L (ref 0–44)
AST: 22 U/L (ref 15–41)
Albumin: 4.2 g/dL (ref 3.5–5.0)
Alkaline Phosphatase: 74 U/L (ref 38–126)
Anion gap: 12 (ref 5–15)
BUN: 16 mg/dL (ref 6–20)
CO2: 28 mmol/L (ref 22–32)
Calcium: 9.1 mg/dL (ref 8.9–10.3)
Chloride: 95 mmol/L — ABNORMAL LOW (ref 98–111)
Creatinine, Ser: 0.93 mg/dL (ref 0.61–1.24)
GFR, Estimated: >60 mL/min (ref >60)
Glucose, Bld: 113 mg/dL — ABNORMAL HIGH (ref 70–99)
Potassium: 3.9 mmol/L (ref 3.5–5.1)
Sodium: 135 mmol/L (ref 135–145)
Total Bilirubin: 0.6 mg/dL (ref 0.3–1.2)
Total Protein: 7.6 g/dL (ref 6.5–8.1)

## 2023-02-02 LAB — PROTIME-INR
INR: 1.2 (ref 0.8–1.2)
Prothrombin Time: 15.1 s (ref 11.4–15.2)

## 2023-02-02 LAB — ETHANOL: Alcohol, Ethyl (B): <10 mg/dL (ref <10)

## 2023-02-02 LAB — APTT: aPTT: 37 s — ABNORMAL HIGH (ref 24–36)

## 2023-02-02 LAB — LITHIUM LEVEL: Lithium Lvl: 0.18 mmol/L — ABNORMAL LOW (ref 0.60–1.20)

## 2023-02-02 MED ORDER — DROPERIDOL 2.5 MG/ML IJ SOLN
2.5000 mg | Freq: Once | INTRAMUSCULAR | Status: AC
  Administered 2023-02-02: 2.5 mg via INTRAVENOUS
  Filled 2023-02-02: qty 2

## 2023-02-02 MED ORDER — PROCHLORPERAZINE EDISYLATE 10 MG/2ML IJ SOLN
10.0000 mg | Freq: Once | INTRAMUSCULAR | Status: AC
  Administered 2023-02-02: 10 mg via INTRAVENOUS
  Filled 2023-02-02: qty 2

## 2023-02-02 MED ORDER — BUTALBITAL-APAP-CAFFEINE 50-325-40 MG PO TABS
2.0000 | ORAL_TABLET | Freq: Once | ORAL | Status: AC
  Administered 2023-02-02: 2 via ORAL
  Filled 2023-02-02: qty 2

## 2023-02-02 MED ORDER — IOHEXOL 350 MG/ML SOLN
125.0000 mL | Freq: Once | INTRAVENOUS | Status: AC | PRN
  Administered 2023-02-02: 125 mL via INTRAVENOUS

## 2023-02-02 MED ORDER — DIPHENHYDRAMINE HCL 50 MG/ML IJ SOLN
25.0000 mg | Freq: Once | INTRAMUSCULAR | Status: AC
  Administered 2023-02-02: 25 mg via INTRAVENOUS
  Filled 2023-02-02: qty 1

## 2023-02-02 NOTE — Consult Note (Signed)
Code stroke activated at 0223. Pt in CT at this time.   [facility] paged at 0228.  Dr Rolla Etienne on camera for neuro exam at 0230.  Pt returned to room from CT at 0231.  Off camera at 0245 and order placed for pt to return for CTA/P.

## 2023-02-02 NOTE — ED Notes (Signed)
Pt transferred himself independently to CT bed without assistance.

## 2023-02-02 NOTE — ED Notes (Signed)
Called [facility] and activate a code stroke, spoke with rep. Ruby

## 2023-02-02 NOTE — ED Notes (Signed)
RN attempted to call mike and Jonny Ruiz x2 in pts contacts. RN left voicemails requesting a return call. MD aware. Charge RN aware.

## 2023-02-02 NOTE — ED Notes (Signed)
Pt finished with CT head non-con

## 2023-02-02 NOTE — Consult Note (Addendum)
Addendum: CTA head/neck reviewed without evidence of proximal intracranial LVO per my review (radiology report pending).  [facility] Consult Services   Patient Name:   Daniel [name], Daniel [name] Date of Birth:   [birth year] Identification Number:   MRN - [id number] Date of Service:   02/02/2023 78:29:56  Diagnosis:       I63.89 - Cerebrovascular accident (CVA) due to other mechanism (HCCC)       R51.9 - Headache, unspecified  Impression:      32 yo M with history of AFib (on Eliquis), TIAs, and multiple other vascular risk factors who presented for evaluation of left-sided numbness/weakness in the setting of a migrainous headache. His neurologic exam was notable for left facial weakness, LUE > LLE weakness, complete left-sided anesthesia, and mild dysarthria. Differential diagnosis includes acute stroke, recrudescence of prior stroke symptoms, complex/hemiplegic migraine, or less likely CNS inflammatory process. He is not a candidate for thrombolytics due to Eliquis use, and CTA head/neck is pending to evaluate for LVO (will place addendum once results are reviewed). Will otherwise recommend trial of migraine cocktail and MRI brain w/wo contrast for further evaluation.  Our recommendations are outlined below.  Recommendations:        Neuro Checks       Bedside Swallow Eval       DVT Prophylaxis       IV Fluids, Normal Saline       Head of Bed 30 Degrees       Euglycemia and Avoid Hyperthermia (PRN Acetaminophen)       Recommend holding anticoagulation pending review of MRI (due to increased risk of hemorrhage with acute stroke)       May give ASA 81 mg daily in the meantime       Migraine cocktail per primary team. Example of migraine cocktail may include any/all of the following (if no medical contraindications): 1000 mL IVF, IV Compazine 5-10 mg, IV Benadryl 25-50 mg, IV Depacon [phone] mg, IV Decadron 8-12 mg, and/or IV MgSO4 2 g       CTA head/neck pending  to rule out LVO (will place addendum once results are reviewed)       MRI brain without contrast to evaluate for acute stroke       If MRI brain demonstrates acute stroke, will recommend completion of stroke workup (including TTE, stroke labs, etc.)       Will defer workup of chest pain/discomfort to primary team  Sign Out:       Discussed with Emergency Department Provider    ------------------------------------------------------------------------------  Advanced Imaging: Advanced imaging has been ordered. Results pending.   Metrics: Last Known Well: 02/02/2023 01:40:00 [facility] Notification Time: 02/02/2023 21:30:86 Arrival Time: 02/02/2023 02:21:50 Stamp Time: 02/02/2023 57:84:69 Initial Response Time: 02/02/2023 02:30:41 Symptoms: left-sided weakness/numbness. Initial patient interaction: 02/02/2023 02:33:35 NIHSS Assessment Completed: 02/02/2023 02:42:27 Patient is not a candidate for Thrombolytic. Thrombolytic Medical Decision: 02/02/2023 02:42:29 Patient was not deemed candidate for Thrombolytic because of following reasons: Use of NOAs within 48 hours.  I personally Reviewed the CT Head and it Showed no acute process  Primary Provider Notified of Diagnostic Impression and Management Plan on: 02/02/2023 02:52:16    ------------------------------------------------------------------------------  History of Present Illness: Patient is a 32 year old Male.  Patient was brought by EMS for symptoms of left-sided weakness/numbness. Patient was last known normal around 1:40 AM this morning. Around that time, he developed acute onset of left-sided weakness/numbness. Upon further questioning, he reported onset of a severe 10/10  headache around that time with associated nausea/vomiting, blurry vision, and photophobia. He also reported chest discomfort with LUE pain as well. He stated that he has had similar left-sided symptoms in the past in the setting of TIAs.   Past  Medical History:      Hypertension      Diabetes Mellitus      Atrial Fibrillation Other PMH:  TIAs  Medications:  Anticoagulant use:  Yes Eliquis No Antiplatelet use Reviewed EMR for current medications  Allergies:  Reviewed Description: Haldol, semaglutide, meperidine, Abilify, Demerol  Social History: Smoking: No  Family History:  There is no family history of premature cerebrovascular disease pertinent to this consultation  ROS : 14 Points Review of Systems was performed and was negative except mentioned in HPI.  Past Surgical History: There Is No Surgical History Contributory To Today's Visit    Examination: BP(114/74), Pulse(75), Blood Glucose(106) 1A: Level of Consciousness - Alert; keenly responsive + 0 1B: Ask Month and Age - Both Questions Right + 0 1C: Blink Eyes & Squeeze Hands - Performs Both Tasks + 0 2: Test Horizontal Extraocular Movements - Normal + 0 3: Test Visual Fields - No Visual Loss + 0 4: Test Facial Palsy (Use Grimace if Obtunded) - Partial paralysis (lower face) + 2 5A: Test Left Arm Motor Drift - No Effort Against Gravity + 3 5B: Test Right Arm Motor Drift - No Drift for 10 Seconds + 0 6A: Test Left Leg Motor Drift - Drift, but doesn't hit bed + 1 6B: Test Right Leg Motor Drift - No Drift for 5 Seconds + 0 7: Test Limb Ataxia (FNF/Heel-Shin) - No Ataxia + 0 8: Test Sensation - Complete Loss: Cannot Sense Being Touched At All + 2 9: Test Language/Aphasia - Normal; No aphasia + 0 10: Test Dysarthria - Mild-Moderate Dysarthria: Slurring but can be understood + 1 11: Test Extinction/Inattention - No abnormality + 0  NIHSS Score: 9  NIHSS Free Text : Left face and hemibody hypoesthesia  Pre-Morbid Modified Rankin Scale: 0 Points = No symptoms at all  Spoke with : Dr. Katrinka Blazing  This consult was conducted in real time using interactive audio and Immunologist. Patient was informed of the technology being used for this visit and agreed to  proceed. Patient located in hospital and provider located at home/office setting.   Patient is being evaluated for possible acute neurologic impairment and high probability of imminent or life-threatening deterioration. I spent total of 30 minutes providing care to this patient, including time for face to face visit via telemedicine, review of medical records, imaging studies and discussion of findings with providers, the patient and/or family.   Dr Wyline Mood   [facility] For Inpatient follow-up with [facility] physician please call [facility] [phone]. This is not an outpatient service. Post hospital discharge, please contact hospital directly.  Please do not communicate with [facility] physicians via secure chat. If you have any questions, Please contact [facility]. Please call or reconsult our service if there are any clinical or diagnostic changes.

## 2023-02-02 NOTE — ED Notes (Signed)
Patient transported to CT by EMS with charge RN, Eliberto Ivory at this time.

## 2023-02-02 NOTE — ED Triage Notes (Signed)
Pt presents to ER via ems from home as a code stroke pt.  Pt called ems around 0140 when pt states he started to experience some nausea and vomiting.  Ems reports pt has some left arm weakness, but has no numbness/tingling.  Pt also reports a "pounding headache" and blurry vision at this time.  Pt is otherwise A&O x4 and in NAD at this time.    EMS reports LVO score of 1.   CBG 117 w/ems Hx of a-fib, previous strokes and MI.  Pt is on eliquis at home.

## 2023-02-02 NOTE — ED Notes (Signed)
Pt arrived in CT  0222: CBG [phone] - tele neuro paged

## 2023-02-02 NOTE — ED Provider Notes (Signed)
[facility] Provider Note    Event Date/Time   First MD Initiated Contact with Patient 02/02/23 0222     (approximate)   History   Code Stroke   HPI  Daniel [name] is a 32 y.o. male who presents to the ED for evaluation of Code Stroke   I am readily familiar with this patient.  History of schizoaffective disorder, paroxysmal A-fib, diastolic dysfunction, seizure disorder.  Resides at a local group home.  Prescribed Eliquis for anticoagulation.   Patient presents to the ED for evaluation of acute left-sided weakness.  Patient reports he was awake at that time and around 1:40 AM developed sudden nausea, pulsating in his head and a pounding headache with diffuse blurry vision throughout his visual fields.  Presents as a field stroke alert.  No falls, syncope, fevers or recent illnesses.  Reports compliance with his medications "when they give them to me."   Physical Exam   Triage Vital Signs: ED Triage Vitals  Encounter Vitals Group     BP 02/02/23 0239 114/74     Systolic BP Percentile --      Diastolic BP Percentile --      Pulse Rate 02/02/23 0239 75     Resp 02/02/23 0239 18     Temp --      Temp src --      SpO2 02/02/23 0239 97 %     Weight 02/02/23 0240 (!) 377 lb 3.3 oz (171.1 kg)     Height --      Head Circumference --      Peak Flow --      Pain Score 02/02/23 0239 10     Pain Loc --      Pain Education --      Exclude from Growth Chart --     Most recent vital signs: Vitals:   02/02/23 0330 02/02/23 0345  BP: (!) 114/55   Pulse: 77 73  Resp: 18 16  Temp:    SpO2:  97%    General: Awake, no distress.  CV:  Good peripheral perfusion.  Resp:  Normal effort.  Abd:  No distention.  MSK:  No deformity noted.  Neuro:  On arrival, left-sided weakness is noted, left upper > left lower.  Numbness throughout the left side as well.  Mild facial asymmetry is noted.  On reassessments, this has resolved and he has symmetric strength  and sensation throughout upper and lower extremities. Other:     ED Results / Procedures / Treatments   Labs (all labs ordered are listed, but only abnormal results are displayed) Labs Reviewed  APTT - Abnormal; Notable for the following components:      Result Value   aPTT 37 (*)    All other components within normal limits  CBC - Abnormal; Notable for the following components:   Hemoglobin 12.7 (*)    All other components within normal limits  COMPREHENSIVE METABOLIC PANEL - Abnormal; Notable for the following components:   Chloride 95 (*)    Glucose, Bld 113 (*)    All other components within normal limits  LITHIUM LEVEL - Abnormal; Notable for the following components:   Lithium Lvl 0.18 (*)    All other components within normal limits  CBG MONITORING, ED - Abnormal; Notable for the following components:   Glucose-Capillary 106 (*)    All other components within normal limits  PROTIME-INR  DIFFERENTIAL  ETHANOL    EKG Sinus rhythm with  a rate of 76 bpm.  Normal axis and intervals.  Nonspecific ST changes inferiorly and laterally without STEMI.  RADIOLOGY CT head interpreted by me without evidence of acute intracranial pathology  Official radiology report(s): MR BRAIN WO CONTRAST  Result Date: 02/02/2023 CLINICAL DATA:  32 year old male code stroke presentation today. Atrial fibrillation on Eliquis. Headache. Left side weakness. EXAM: MRI HEAD WITHOUT CONTRAST TECHNIQUE: Multiplanar, multiecho pulse sequences of the brain and surrounding structures were obtained without intravenous contrast. COMPARISON:  Head CT 0229 hours today, CTA head and neck. Previous brain MRI 05/22/2022. FINDINGS: Intermittent mild motion artifact today despite repeated imaging attempts. Brain: Cerebral volume is stable and within normal limits. No restricted diffusion to suggest acute infarction. No midline shift, mass effect, evidence of mass lesion, ventriculomegaly, extra-axial collection or  acute intracranial hemorrhage. Cervicomedullary junction and pituitary are within normal limits. No encephalomalacia identified, gray and white matter signal appears stable and within normal limits throughout the brain when allowing for mild motion. No convincing chronic cerebral blood products on SWI. Deep gray nuclei, brainstem and cerebellum appear negative. Vascular: Major intracranial vascular flow voids are stable from last year. Skull and upper cervical spine: Negative visible cervical spine. Visualized bone marrow signal is within normal limits. Sinuses/Orbits: Grossly stable, negative orbits. Paranasal sinuses and mastoids are clear. Other: Grossly negative visible internal auditory structures. Negative visible scalp and face. IMPRESSION: Stable and normal noncontrast MRI appearance of the Brain when allowing for mild motion artifact. Electronically Signed   By: Odessa Fleming M.D.   On: 02/02/2023 04:38   CT ANGIO HEAD NECK W WO CM W PERF (CODE STROKE)  Result Date: 02/02/2023 CLINICAL DATA:  Initial evaluation for neuro deficit, stroke. EXAM: CT ANGIOGRAPHY HEAD AND NECK CT PERFUSION BRAIN TECHNIQUE: Multidetector CT imaging of the head and neck was performed using the standard protocol during bolus administration of intravenous contrast. Multiplanar CT image reconstructions and MIPs were obtained to evaluate the vascular anatomy. Carotid stenosis measurements (when applicable) are obtained utilizing NASCET criteria, using the distal internal carotid diameter as the denominator. Multiphase CT imaging of the brain was performed following IV bolus contrast injection. Subsequent parametric perfusion maps were calculated using RAPID software. RADIATION DOSE REDUCTION: This exam was performed according to the departmental dose-optimization program which includes automated exposure control, adjustment of the mA and/or kV according to patient size and/or use of iterative reconstruction technique. CONTRAST:   OMNIPAQUE IOHEXOL 350 MG/ML SOLN COMPARISON:  Prior study from earlier the same day as well as earlier exams. FINDINGS: CTA NECK FINDINGS Aortic arch: Standard branching. Imaged portion shows no evidence of aneurysm or dissection. No significant stenosis of the major arch vessel origins. Right carotid system: No evidence of dissection, stenosis (50% or greater) or occlusion. Left carotid system: No evidence of dissection, stenosis (50% or greater) or occlusion. Vertebral arteries: Both vertebral arteries arise from the subclavian arteries. Proximal right vertebral artery somewhat limited evaluation due to motion. Visualized portions of the vertebral arteries patent without stenosis or dissection. Skeleton: No discrete or worrisome osseous lesions. Other neck: No other acute finding. Upper chest: Hazy ground-glass density within the visualized lungs, likely mild pulmonary interstitial congestion. Review of the MIP images confirms the above findings CTA HEAD FINDINGS Anterior circulation: Both internal carotid arteries widely patent to the termini without stenosis. A1 segments patent bilaterally. Normal anterior communicating complex. Anterior cerebral arteries patent without proximal stenosis. No M1 stenosis or occlusion. No proximal MCA branch occlusion or high-grade stenosis. Distal MCA branches  perfused and symmetric. Question diffuse distal small vessel irregularity. Posterior circulation: Both V4 segments patent without stenosis. Both PICA patent. Basilar patent without stenosis. Superior cerebellar arteries patent bilaterally. Left PCA supplied via the basilar. Fetal type origin of the right PCA. Both PCAs patent to their distal aspects without stenosis. Question distal small vessel irregularity. Venous sinuses: Patent allowing for timing the contrast bolus. Anatomic variants: Fetal type right PCA.  No aneurysm. Review of the MIP images confirms the above findings CT Brain Perfusion Findings: ASPECTS: 10 CBF  (<30%) Volume: 0mL Perfusion (Tmax>6.0s) volume: 0mL Mismatch Volume: 0mL Infarction Location:Negative CT perfusion with no evidence for acute ischemia or other perfusion deficit. IMPRESSION: 1. Negative CTA of the head and neck for large vessel occlusion or other emergent finding. 2. Negative CT perfusion with no evidence for acute ischemia or other perfusion deficit. 3. Question distal small vessel atheromatous irregularity within the intracranial circulation. While this finding could be artifactual, possible premature atherosclerotic disease or possibly vasculopathy could also be considered. Electronically Signed   By: Rise Mu M.D.   On: 02/02/2023 04:06   CT HEAD CODE STROKE WO CONTRAST  Result Date: 02/02/2023 CLINICAL DATA:  Code stroke. Initial evaluation for neuro deficit, stroke suspected. EXAM: CT HEAD WITHOUT CONTRAST TECHNIQUE: Contiguous axial images were obtained from the base of the skull through the vertex without intravenous contrast. RADIATION DOSE REDUCTION: This exam was performed according to the departmental dose-optimization program which includes automated exposure control, adjustment of the mA and/or kV according to patient size and/or use of iterative reconstruction technique. COMPARISON:  None Available. FINDINGS: Brain: Cerebral volume within normal limits for patient age. No evidence for acute intracranial hemorrhage. No findings to suggest acute large vessel territory infarct. No mass lesion, midline shift, or mass effect. Ventricles are normal in size without evidence for hydrocephalus. No extra-axial fluid collection identified. Vascular: No hyperdense vessel identified. Skull: Scalp soft tissues demonstrate no acute abnormality. Calvarium intact. Sinuses/Orbits: Globes and orbital soft tissues within normal limits. Visualized paranasal sinuses are clear. No mastoid effusion. ASPECTS [facility] Stroke Program Early CT Score) - Ganglionic level infarction (caudate,  lentiform nuclei, internal capsule, insula, M1-M3 cortex): 7 - Supraganglionic infarction (M4-M6 cortex): 3 Total score (0-10 with 10 being normal): 10 IMPRESSION: 1. Normal head CT.  No acute intracranial abnormality. 2. ASPECTS is 10. Critical Value/emergent results were called by telephone at the time of interpretation on 02/02/2023 at 2:33 am to provider [facility] , who verbally acknowledged these results. Electronically Signed   By: Rise Mu M.D.   On: 02/02/2023 02:34    PROCEDURES and INTERVENTIONS:  .1-3 Lead EKG Interpretation  Performed by: Delton Prairie, MD Authorized by: Delton Prairie, MD     Interpretation: normal     ECG rate:  76   ECG rate assessment: normal     Rhythm: sinus rhythm     Ectopy: none     Conduction: normal   .Critical Care  Performed by: Delton Prairie, MD Authorized by: Delton Prairie, MD   Critical care provider statement:    Critical care time (minutes):  30   Critical care time was exclusive of:  Separately billable procedures and treating other patients   Critical care was necessary to treat or prevent imminent or life-threatening deterioration of the following conditions:  CNS failure or compromise   Critical care was time spent personally by me on the following activities:  Development of treatment plan with patient or surrogate, discussions with consultants, evaluation of  patient's response to treatment, examination of patient, ordering and review of laboratory studies, ordering and review of radiographic studies, ordering and performing treatments and interventions, pulse oximetry, re-evaluation of patient's condition and review of old charts   Medications  butalbital-acetaminophen-caffeine (FIORICET) 50-325-40 MG per tablet 2 tablet (2 tablets Oral Given 02/02/23 0304)  diphenhydrAMINE (BENADRYL) injection 25 mg (25 mg Intravenous Given 02/02/23 0304)  prochlorperazine (COMPAZINE) injection 10 mg (10 mg Intravenous Given 02/02/23 0304)  iohexol  (OMNIPAQUE) 350 MG/ML injection 125 mL (125 mLs Intravenous Contrast Given 02/02/23 0328)  droperidol (INAPSINE) 2.5 MG/ML injection 2.5 mg (2.5 mg Intravenous Given 02/02/23 0350)     IMPRESSION / MDM / ASSESSMENT AND PLAN / ED COURSE  I reviewed the triage vital signs and the nursing notes.  Differential diagnosis includes, but is not limited to, ischemic stroke, ICH, hemiplegic migraine, conversion disorder, malingering  {Patient presents with symptoms of an acute illness or injury that is potentially life-threatening.  Patient presents as a field stroke alert with left-sided weakness, possibly related to hemiplegic migraine, with a benign extensive workup and ultimately suitable for outpatient management.  Presents with left-sided weakness and numbness.  CT and angiogram are reassuring, no LVO.  Subsequent MRI brain without evidence of acute pathology or acute strokes.  With management of his migraine, Fioricet, Benadryl and Compazine, his symptoms resolved and neurologically back to baseline.  Normal metabolic panel and CBC.  While I considered observation admission of this patient, suspect outpatient management is reasonable.  Will discharge back to his group home  Clinical Course as of 02/02/23 0532  Thu Feb 02, 2023  [id number] Call from neuro consult.  Angiogram with perfusion, subsequent MRI.  Empiric migraine cocktail [DS]  0500 Reassessed. Patient reports his head is feeling better and improved strength to his left side. We discussed reassuring imaging [DS]    Clinical Course User Index [DS] Delton Prairie, MD     FINAL CLINICAL IMPRESSION(S) / ED DIAGNOSES   Final diagnoses:  Left-sided weakness  Hemiplegic migraine without status migrainosus, not intractable     Rx / DC Orders   ED Discharge Orders     None        Note:  This document was prepared using Dragon voice recognition software and may include unintentional dictation errors.   Delton Prairie, MD 02/02/23 [phone]

## 2023-02-02 NOTE — ED Notes (Signed)
Pt has no difficulty speaking, no slurred speech noticed, no facial droop other than when asked to smile.

## 2023-02-02 NOTE — ED Notes (Addendum)
Group home representative Daniel [name] here to pick pt up.

## 2023-02-02 NOTE — Discharge Instructions (Addendum)
No signs of stroke on MRI of Daniel [name]'s brain. His weakness resolved with headache medications

## 2023-02-06 ENCOUNTER — Ambulatory Visit: Payer: MEDICAID | Attending: Family | Admitting: Family

## 2023-02-06 ENCOUNTER — Other Ambulatory Visit (HOSPITAL_COMMUNITY): Payer: Self-pay

## 2023-02-06 ENCOUNTER — Telehealth: Payer: Self-pay

## 2023-02-06 ENCOUNTER — Encounter: Payer: Self-pay | Admitting: Family

## 2023-02-06 VITALS — BP 119/70 | HR 72 | Wt 372.5 lb

## 2023-02-06 DIAGNOSIS — Z7984 Long term (current) use of oral hypoglycemic drugs: Secondary | ICD-10-CM | POA: Insufficient documentation

## 2023-02-06 DIAGNOSIS — E119 Type 2 diabetes mellitus without complications: Secondary | ICD-10-CM

## 2023-02-06 DIAGNOSIS — I5022 Chronic systolic (congestive) heart failure: Secondary | ICD-10-CM | POA: Diagnosis not present

## 2023-02-06 DIAGNOSIS — R0602 Shortness of breath: Secondary | ICD-10-CM | POA: Diagnosis present

## 2023-02-06 DIAGNOSIS — Z79899 Other long term (current) drug therapy: Secondary | ICD-10-CM | POA: Diagnosis not present

## 2023-02-06 DIAGNOSIS — I480 Paroxysmal atrial fibrillation: Secondary | ICD-10-CM

## 2023-02-06 DIAGNOSIS — Z8673 Personal history of transient ischemic attack (TIA), and cerebral infarction without residual deficits: Secondary | ICD-10-CM | POA: Diagnosis not present

## 2023-02-06 DIAGNOSIS — Z87891 Personal history of nicotine dependence: Secondary | ICD-10-CM | POA: Insufficient documentation

## 2023-02-06 DIAGNOSIS — G4733 Obstructive sleep apnea (adult) (pediatric): Secondary | ICD-10-CM

## 2023-02-06 DIAGNOSIS — I428 Other cardiomyopathies: Secondary | ICD-10-CM | POA: Insufficient documentation

## 2023-02-06 DIAGNOSIS — I10 Essential (primary) hypertension: Secondary | ICD-10-CM

## 2023-02-06 DIAGNOSIS — E785 Hyperlipidemia, unspecified: Secondary | ICD-10-CM | POA: Diagnosis not present

## 2023-02-06 DIAGNOSIS — R059 Cough, unspecified: Secondary | ICD-10-CM | POA: Diagnosis not present

## 2023-02-06 DIAGNOSIS — E669 Obesity, unspecified: Secondary | ICD-10-CM | POA: Diagnosis not present

## 2023-02-06 DIAGNOSIS — Z7901 Long term (current) use of anticoagulants: Secondary | ICD-10-CM | POA: Insufficient documentation

## 2023-02-06 DIAGNOSIS — R0789 Other chest pain: Secondary | ICD-10-CM | POA: Diagnosis not present

## 2023-02-06 DIAGNOSIS — I110 Hypertensive heart disease with heart failure: Secondary | ICD-10-CM | POA: Diagnosis not present

## 2023-02-06 MED ORDER — SPIRONOLACTONE 25 MG PO TABS
25.0000 mg | ORAL_TABLET | Freq: Every day | ORAL | 3 refills | Status: DC
Start: 2023-02-06 — End: 2023-05-09

## 2023-02-06 NOTE — Telephone Encounter (Signed)
Patient stopped by the office this morning. He said he has still not received his BiPAP machine. He said [employer] told him that they need an order to process it.  Synetta Fail can you look into this? Thank you!

## 2023-02-06 NOTE — Telephone Encounter (Signed)
I spoke with Ashly with [employer] and  he stated they are waiting for signed CMN through [employer].  It was sent to Chantel B to sign off on 01/27/2023

## 2023-02-06 NOTE — Group Note (Deleted)

## 2023-02-06 NOTE — Patient Instructions (Addendum)
INCREASE FUROSEMIDE 40 MG (2 PILLS) ONCE DAILY FOR THREE DAYS   START SPIRONOLACTONE 25 ONCE DAILY  Continue weighing daily and call for an overnight weight gain of 3 pounds or more or a weekly weight gain of more than 5 pounds.  If you receive a satisfaction survey regarding the [facility], please take the time to fill it out. This way we can continue to provide excellent care and make any changes that need to be made.

## 2023-02-06 NOTE — Progress Notes (Signed)
Patient ID: Daniel [name], male    DOB: June 16, 1990, 32 y.o.   MRN: [id number]  PCP: Anselm Jungling, NP (at group home) Primary Cardiologist: Yvonne Kendall, MD (last seen 01/24) HF provider: Marca Ancona, MD (last seen 03/24)  HPI:  Daniel [name] is a 32 y/o male with a history of DM, hyperlipidemia, HTN, paroxysmal atrial fibrillation, conversion disorder, sleep apnea, schizoaffective disorder, seizure, TIA, urethral stricture, tobacco use and chronic heart failure. Patient has had a long history of paroxysmal AF.  He says this was diagnosed when he was 20 and he was told that it was triggered by excessive use of energy drinks.  He never abused drugs or ETOH per his report though he was a smoker until 5/23.  He was followed in [place] in the past.  There was talk of atrial fibrillation ablation but it was never undertaken. Subsequently, he moved to a group home in [place].    Patient has had home oxygen since 2022 per his report.  High resolution CT chest in 2/24 showed no ILD, possible small airways disease.  PFTs in 2/24 showed minimal obstructive airways disease.  Sleep study showed severe OSA.  Was in the ED 01/14/23 due to increasing swelling in both of his legs along with some difficulty breathing over the past 2 days. EKG shows no evidence of arrhythmia or ischemia and troponin within normal limits, chest x-ray negative for acute process.  No significant anemia, leukocytosis, tract abnormality, or AKI noted. Urinalysis with no signs of infection. IV lasix given. Was in the ED 01/23/23 due to shortness of breath and chest pain that started earlier today. Also complains of swelling in bilateral legs. EKG chest x-ray labs and vital signs are normal. IV lasix provided with higher outpatient lasix dose for a few days. Was in the ED 02/02/23 due to acute left-sided weakness. Patient reports he was awake at that time and around 1:40 AM developed sudden nausea, pulsating in his head and a pounding  headache with diffuse blurry vision throughout his visual fields. CT and angiogram are reassuring, no LVO. Subsequent MRI brain without evidence of acute pathology or acute strokes. With management of his migraine with Fioricet, Benadryl and Compazine, his symptoms resolved and neurologically back to baseline. Normal metabolic panel and CBC.   Echo 08/04/21: EF of 50% along with mild LVH Echo 10/22/21: EF of 45-50%. Echo 08/22/22: EF 55-60%.    RHC 10/07/22: Hemodynamics (mmHg) RA mean 2 RV 20/2 PA 23/8, mean 15 PCWP mean 3  Oxygen saturations: PA 74% AO 96%  Cardiac Output (Fick) 9.71  Cardiac Index (Fick) 3.34   He presents today for a HF f/u visit with a chief complaint of moderate SOB with minimal exertion. Chronic in nature. Has associated chronic trouble sleeping, fatigue, dizziness at times, chronic chest discomfort, cough and pedal edema along with this. Denies palpitations, abdominal distention or weight gain.    Now living at an alternative living facility in [place] and is more pleased. He says that he's eating better at new facility, getting outside more and hoping to get trained for employment. Receiving psychotherapy and behavioral therapy & says that is making a big difference.   He is asking if there's any alternatives for weight loss medication as he would be willing to try something else. Reginal Lutes made him feel more anxious and have some suicidal thoughts so it was stopped.   He still has not gotten his bipap machine. Has not gotten his oxygen since he moved either.  Needs f/u appt scheduled with cardiology, pulmonology and the nutritionist as he feels like some of that got lost with his moving.   ROS: All systems negative except as listed in HPI, PMH and Problem List.  SH:  Social History   Socioeconomic History   Marital status: Single    Spouse name: Not on file   Number of children: Not on file   Years of education: Not on file   Highest education level: Not on file   Occupational History   Not on file  Tobacco Use   Smoking status: Former    Current packs/day: 0.00    Types: Cigarettes    Start date: 08/2019    Quit date: 08/2021    Years since quitting: 1.4   Smokeless tobacco: Never  Vaping Use   Vaping status: Never Used  Substance and Sexual Activity   Alcohol use: Not Currently   Drug use: Never   Sexual activity: Not on file  Other Topics Concern   Not on file  Social History Narrative   Now living in a group home in [place].   Social Determinants of Health   Financial Resource Strain: Low Risk  (03/16/2021)   Received from [employer], [facility]   Overall Financial Resource Strain (CARDIA)    Difficulty of Paying Living Expenses: Not very hard  Food Insecurity: No Food Insecurity (09/01/2020)   Received from [facility], [facility]   Hunger Vital Sign    Worried About Running Out of Food in the Last Year: Never true    Ran Out of Food in the Last Year: Never true  Transportation Needs: Not on file  Physical Activity: Not on file  Stress: No Stress Concern Present (03/16/2021)   Received from [facility], [facility]   [employer] of Occupational Health - Occupational Stress Questionnaire    Feeling of Stress : Not at all  Social Connections: Unknown (09/27/2021)   Received from [facility], [facility]   Social Network    Social Network: Not on file  Intimate Partner Violence: Unknown (09/01/2021)   Received from [facility], [facility]   HITS    Physically Hurt: Not on file    Insult or Talk Down To: Not on file    Threaten Physical Harm: Not on file    Scream or Curse: Not on file    FH: No family history on file.  Past Medical History:  Diagnosis Date   Cardiomyopathy [facility])    a.) TTE 08/04/2021: EF 50%; b.) TTE 10/22/2021: EF 45-50-%; c.) TTE 08/22/2022: EF 55-60%   Chest pain    CHF (congestive heart failure) (HCC)    a.) TTE 08/04/2021: EF 50%, mild LVH, RVSF norm; b.) TTE  10/22/2021: EF 45-50%, glob HK, RVSF norm; c.) TTE 08/22/2022: EF 55-60%, mild dil LV, RVSF norm.   Conversion disorder    Current use of long term anticoagulation    Dyspnea    GERD (gastroesophageal reflux disease)    Hepatic steatosis    Hyperlipidemia    Hypertension    Insomnia    a.) melatonin PRN   Long term current use of anticoagulant    a.) apixaban   OSA on CPAP    Persistent atrial fibrillation and flutter (HCC)    a.) CHA2DS2VASc = 5 (CHF, HTN, TIA x 2, T2DM);  b.) rate/rhythm maintained on oral diltiazem + metoprolol succinate; chronically anticoagulated with apixaban   Schizoaffective disorder (HCC)    Seizure disorder (HCC)  T2DM (type 2 diabetes mellitus) (HCC)    TIA (transient ischemic attack)    Urethral stricture    a. 08/2021 s/p cystoscopy and urethral dilation.    Current Outpatient Medications  Medication Sig Dispense Refill   acetaminophen (TYLENOL) 325 MG tablet Take 2 tablets (650 mg total) by mouth every 4 (four) hours as needed. 100 tablet 2   albuterol (VENTOLIN HFA) 108 (90 Base) MCG/ACT inhaler Inhale 2 puffs into the lungs every 6 (six) hours as needed for wheezing. 1 each 2   allopurinol (ZYLOPRIM) 300 MG tablet Take 1 tablet (300 mg total) by mouth daily. 30 tablet 0   atorvastatin (LIPITOR) 80 MG tablet Take 80 mg by mouth daily.     baclofen (LIORESAL) 10 MG tablet Take 1 tablet (10 mg total) by mouth 2 (two) times daily as needed for muscle spasms. Home med. (Patient taking differently: Take 10 mg by mouth 2 (two) times daily. Home med.) 30 each 0   benztropine (COGENTIN) 1 MG tablet Take 1 mg by mouth daily.     cyclobenzaprine (FLEXERIL) 5 MG tablet Take 5 mg by mouth 3 (three) times daily as needed for muscle spasms.     diltiazem (CARDIZEM CD) 360 MG 24 hr capsule Take 1 capsule (360 mg total) by mouth daily. 30 capsule 1   divalproex (DEPAKOTE) 500 MG DR tablet Take 2 tablets (1,000 mg total) by mouth 2 (two) times daily. (Patient taking  differently: Take 1,000 mg by mouth daily at 6 (six) AM.)     doxycycline (VIBRA-TABS) 100 MG tablet Take 100 mg by mouth 2 (two) times daily.     ELIQUIS 5 MG TABS tablet Take 5 mg by mouth 2 (two) times daily.     escitalopram (LEXAPRO) 10 MG tablet Take 20 mg by mouth daily.     FARXIGA 10 MG TABS tablet TAKE 1 TABLET BY MOUTH ONCE DAILY BEFORE BREAKFAST 28 tablet 10   fluticasone (FLONASE) 50 MCG/ACT nasal spray Place 2 sprays into both nostrils 2 (two) times daily. (Patient not taking: Reported on 02/01/2023)     furosemide (LASIX) 20 MG tablet Take 1 tablet (20 mg total) by mouth daily. 2 tablet 0   hydrOXYzine (ATARAX) 25 MG tablet Take 25 mg by mouth daily. At noon     hydrOXYzine (ATARAX) 50 MG tablet Take 50 mg by mouth in the morning and at bedtime.     INVEGA 9 MG 24 hr tablet Take 9 mg by mouth every morning.     lithium carbonate (LITHOBID) 300 MG CR tablet Take 600 mg by mouth every evening.     loratadine (CLARITIN) 10 MG tablet Take 10 mg by mouth daily.     melatonin 3 MG TABS tablet Take 3 mg by mouth at bedtime.     metoprolol succinate (TOPROL-XL) 100 MG 24 hr tablet TAKE 1 AND 1/2 TABLET (150MG ) BY MOUTH ONCE DAILY  WITH OR IMMEDIATELY FOLLOWING A MEAL. 42 tablet 9   omeprazole (PRILOSEC) 20 MG capsule Take 20 mg by mouth daily.     ondansetron (ZOFRAN-ODT) 4 MG disintegrating tablet Take 1 tablet (4 mg total) by mouth every 8 (eight) hours as needed. 20 tablet 0   potassium chloride SA (KLOR-CON M) 20 MEQ tablet Take 1 tablet (20 mEq total) by mouth daily for 3 days. 3 tablet 0   sacubitril-valsartan (ENTRESTO) 24-26 MG TAKE 1 TABLET BY MOUTH TWICE DAILY 60 tablet 11   tamsulosin (FLOMAX) 0.4 MG CAPS capsule  Take 1 capsule (0.4 mg total) by mouth daily. 30 capsule 2   No current facility-administered medications for this visit.   Vitals:   02/06/23 1031  BP: 119/70  Pulse: 72  SpO2: 97%  Weight: (!) 372 lb 8 oz (169 kg)   Wt Readings from Last 3 Encounters:   02/06/23 (!) 372 lb 8 oz (169 kg)  02/02/23 (!) 377 lb 3.3 oz (171.1 kg)  02/01/23 (!) 374 lb (169.6 kg)   Lab Results  Component Value Date   CREATININE 0.93 02/02/2023   CREATININE 0.95 01/23/2023   CREATININE 0.93 01/14/2023   PHYSICAL EXAM:  General:  Well appearing. No resp difficulty HEENT: normal Neck: supple. JVP flat. No lymphadenopathy or thryomegaly appreciated. Cor: PMI normal. Regular rate & rhythm. No rubs, gallops or murmurs. Lungs: clear Abdomen: soft, nontender, nondistended. No hepatosplenomegaly. No bruits or masses.  Extremities: no cyanosis, clubbing, rash, trace pitting edema right lower leg/ 1+ pitting left lower leg Neuro: alert & orientedx3, cranial nerves grossly intact. Moves all 4 extremities w/o difficulty. Affect pleasant.   ECG: not done  ReDs: 46%   ASSESSMENT & PLAN:  1: NICM with mildly reduced ejection fraction- - suspect due to AF/ OSA - NYHA class III - mildly fluid overloaded with increased symptoms and elevated ReDs reading - weighing daily & says that he's lost 3 pounds; reminded to call for an overnight weight gain of > 2 pounds or a weekly weight gain of > 5 pounds - weight down 1 pound from last visit here 2 months ago - ReDs 46%  - Echo 08/04/21: EF of 50% along with mild LVH - Echo 10/22/21: EF of 45-50%. - Echo 08/22/22: EF 55-60%.   - RHC 10/07/22:   Hemodynamics (mmHg)   RA mean 2   RV 20/2   PA 23/8, mean 15   PCWP mean 3   Oxygen saturations:   PA 74%   AO 96%   Cardiac Output (Fick) 9.71    Cardiac Index (Fick) 3.34  - he says that he's eating better at the new facility that he's living at - not adding salt to his food - emphasized keeping daily fluid intake to 60 ounces daily  - continue metoprolol succinate 150mg  daily - continue farxiga 10mg  daily - continue entresto 24/26mg  BID - increase furosemide to 40mg  daily X 3 days and then decrease it back to 20mg  daily - begin spironolactone 25mg  daily - BMET in 1  week and then again at next OV - saw HF provider Shirlee Latch) 03/24 - BNP 12/01/22 was 39.7  2: HTN- - BP 119/70 - sees PCP at the group home  - BMP 02/02/23 showed sodium 135, potassium 3.9, creatinine 0.93 and GFR >60 - BMP in 1 week  3: Paroxysmal atrial fibrillation- - per his report, onset was around age 73 - saw cardiology ([place]) 01/24; advised him to go downstairs to their office and schedule f/u appt - continue apixaban 5mg  BID - would pursue atrial fibrillation ablation if he has recurrence of atrial fibrillation once he is on bipap   4: Obesity- - A1c 08/05/22 was 5.8% - had medical nutrition visit 11/23/22; advised to call them to get f/u appt scheduled - PharmD Melford Aase) will look into whether we can try mounjaro or saxenda; patient is agreeable to this   5: Sleep apnea- - saw pulmonology Clent Ridges) 04/24 - Split night sleep study 07/07/22 showed severe OSA, AHI 143/hour with severe oxygen desaturations to 63%. Corrected with  BIPAP 20/20cm h20 with large full facemask - still waiting on bipap; emphasized that he go downstairs to pulmonology office and get f/u scheduled and ask them to address the bipap issue - waiting on oxygen (through [employer]) to get started at his new facility  6: DM- - A1c 08/05/22 was 5.8%  Return in 1 month, sooner if needed.

## 2023-02-08 ENCOUNTER — Telehealth: Payer: Self-pay | Admitting: Pharmacist

## 2023-02-08 NOTE — Telephone Encounter (Signed)
PA approved for [employer] (Key: [id number])

## 2023-02-09 ENCOUNTER — Other Ambulatory Visit: Payer: Self-pay

## 2023-02-09 MED ORDER — DILTIAZEM HCL ER COATED BEADS 360 MG PO CP24: 360.0000 mg | ORAL_CAPSULE | Freq: Every day | ORAL | 5 refills | Status: DC

## 2023-02-09 MED ORDER — DAPAGLIFLOZIN PROPANEDIOL 10 MG PO TABS: 10.0000 mg | ORAL_TABLET | Freq: Every day | ORAL | 5 refills | Status: DC

## 2023-02-09 MED ORDER — FUROSEMIDE 20 MG PO TABS: 20.0000 mg | ORAL_TABLET | Freq: Every day | ORAL | 5 refills | Status: DC

## 2023-02-09 NOTE — Telephone Encounter (Signed)
Refill request from [facility] for diltiazem, farxiga, and furosemide.

## 2023-02-10 ENCOUNTER — Emergency Department: Payer: MEDICAID

## 2023-02-10 ENCOUNTER — Encounter: Payer: Self-pay | Admitting: Nurse Practitioner

## 2023-02-10 ENCOUNTER — Other Ambulatory Visit: Payer: Self-pay

## 2023-02-10 ENCOUNTER — Ambulatory Visit (INDEPENDENT_AMBULATORY_CARE_PROVIDER_SITE_OTHER): Payer: MEDICAID | Admitting: Nurse Practitioner

## 2023-02-10 VITALS — BP 128/86 | HR 96 | Temp 97.9°F | Ht 76.0 in | Wt 361.4 lb

## 2023-02-10 DIAGNOSIS — R079 Chest pain, unspecified: Secondary | ICD-10-CM

## 2023-02-10 DIAGNOSIS — E860 Dehydration: Secondary | ICD-10-CM | POA: Insufficient documentation

## 2023-02-10 DIAGNOSIS — I482 Chronic atrial fibrillation, unspecified: Secondary | ICD-10-CM

## 2023-02-10 DIAGNOSIS — J9611 Chronic respiratory failure with hypoxia: Secondary | ICD-10-CM | POA: Diagnosis not present

## 2023-02-10 DIAGNOSIS — R002 Palpitations: Secondary | ICD-10-CM | POA: Diagnosis not present

## 2023-02-10 DIAGNOSIS — R0789 Other chest pain: Secondary | ICD-10-CM | POA: Insufficient documentation

## 2023-02-10 DIAGNOSIS — J4530 Mild persistent asthma, uncomplicated: Secondary | ICD-10-CM

## 2023-02-10 DIAGNOSIS — R0609 Other forms of dyspnea: Secondary | ICD-10-CM | POA: Insufficient documentation

## 2023-02-10 DIAGNOSIS — G4730 Sleep apnea, unspecified: Secondary | ICD-10-CM

## 2023-02-10 LAB — CBC
HCT: 41.7 % (ref 39.0–52.0)
Hemoglobin: 13.6 g/dL (ref 13.0–17.0)
MCH: 28 pg (ref 26.0–34.0)
MCHC: 32.6 g/dL (ref 30.0–36.0)
MCV: 85.8 fL (ref 80.0–100.0)
Platelets: 218 10*3/uL (ref 150–400)
RBC: 4.86 MIL/uL (ref 4.22–5.81)
RDW: 14 % (ref 11.5–15.5)
WBC: 8.1 10*3/uL (ref 4.0–10.5)
nRBC: 0 % (ref 0.0–0.2)

## 2023-02-10 LAB — BASIC METABOLIC PANEL
Anion gap: 11 (ref 5–15)
BUN: 18 mg/dL (ref 6–20)
CO2: 28 mmol/L (ref 22–32)
Calcium: 9.5 mg/dL (ref 8.9–10.3)
Chloride: 96 mmol/L — ABNORMAL LOW (ref 98–111)
Creatinine, Ser: 1.05 mg/dL (ref 0.61–1.24)
GFR, Estimated: >60 mL/min (ref >60)
Glucose, Bld: 106 mg/dL — ABNORMAL HIGH (ref 70–99)
Potassium: 4.3 mmol/L (ref 3.5–5.1)
Sodium: 135 mmol/L (ref 135–145)

## 2023-02-10 LAB — TROPONIN I (HIGH SENSITIVITY)
Troponin I (High Sensitivity): 3 ng/L (ref <18)
Troponin I (High Sensitivity): 3 ng/L (ref <18)

## 2023-02-10 MED ORDER — FLUTICASONE FUROATE-VILANTEROL 100-25 MCG/ACT IN AEPB
1.0000 | INHALATION_SPRAY | Freq: Every day | RESPIRATORY_TRACT | 5 refills | Status: AC
Start: 2023-02-10 — End: ?

## 2023-02-10 NOTE — ED Triage Notes (Signed)
Pt here with cp and sob, worsening today. Pt states pain is left sided and radiates to his shoulder. Pt states pain is tightness and it is constant. Pt denies NVD. Pt states he is out of his a fib and htn medication.

## 2023-02-10 NOTE — Assessment & Plan Note (Signed)
Continue supplemental oxygen at night. Goal >88-90%

## 2023-02-10 NOTE — Telephone Encounter (Signed)
Message sent to [employer] through parachute stating need a paper order or put through the other side of parachute

## 2023-02-10 NOTE — Progress Notes (Signed)
Error; see alternative encounter

## 2023-02-10 NOTE — Assessment & Plan Note (Addendum)
Acute chest pain radiating to left arm and neck. EKG nl without arrhythmia or t wave abnormalities. Advised further evaluation in ED to rule out ACS/acute etiology. VS were stable so pt was transported to ED in a wheelchair with CMA and oxygen.

## 2023-02-10 NOTE — Progress Notes (Signed)
@Patient  ID: Daniel [name], male    DOB: Mar 02, 1991, 32 y.o.   MRN: [id number]  Chief Complaint  Patient presents with   Follow-up    Increased SOB. No wheezing. Chest pain.     Referring provider: Anselm Jungling, NP  HPI: 32 year old male, former smoker followed for severe OSA, chronic hypoxic respiratory failure.  He was last seen by Clent Ridges, NP 11/02/2022.  Past medical history significant for A-fib RVR, cardiomyopathy, CHF, hypertension, DM 2, obesity, schizoaffective/bipolar disorder, history of SI, anxiety.  TEST/EVENTS:   02/10/2023: Today - acute Patient presents today for acute visit.  He has had worsening shortness of breath over the last few weeks.  He went to the emergency department on 01/14/2023 with increased swelling in both legs and difficulty breathing.  Workup did not reveal any acute process.  He was given IV Lasix and discharged home.  He then went back to the ED on 01/23/2023 due to shortness of breath and chest pain that started earlier in the day.  He also had complaints of swelling in both of his legs.  Again workup was unremarkable.  He was treated with IV Lasix and higher outpatient dose for a few days.  He went back to the emergency room on 02/02/2023 due to acute left-sided weakness.  He had woken up around 1:40 AM and developed sudden nausea, pulsating in his head and a pounding headache with diffuse blurry vision.  CT of the head and MRI were reassuring without evidence of acute pathology or acute strokes.  Treated as migraine with Fioricet, Benadryl and Compazine.  Symptoms resolved and neurologically back to baseline.  He was still having difficulties with his breathing so he saw his cardiologist 02/06/2023.  Moderate shortness of breath with minimal exertion.  Note reviewed.  He was started on spironolactone and advised to increase Lasix to 40 mg for 3 days and then return back to 20 mg daily.  He was also concerned about his A-fib and was instructed to continue his  diltiazem.  If recurrence, may consider ablation. Today, he tells me that his swelling has gotten better with the increased Lasix and spironolactone.  He unfortunately ran out of his diltiazem and the pharmacy would not refill it so he has felt like he has been in and out of A-fib RVR.  On Tuesday, he developed chest pressure but did not have this further evaluated.  He tells me that today he is still having some shortness of breath.  When he does use his albuterol, it helps.  Does have difficulties lying flat at night which is normal for him.  Sometimes wakes up short of breath.  Leg swelling is better. Denies any cough, wheezing, fevers, syncope.   Towards the end of his visit, he told me that he was experiencing 10 out of 10 chest pain that was radiating to his left neck and arm.  EKG showed normal sinus rhythm without any T wave abnormalities. He also became diaphoretic and was visibly more dyspneic. VS were stable. Placed on 2 lpm supplemental O2. He was taken to the ED for further workup/evaluation.   Allergies  Allergen Reactions   Haldol [Haloperidol] Other (See Comments)    SI   Semaglutide Anxiety    Increase in suicidal thoughts   Dermatitis Antigen Rash   Meperidine     Meperidine   Abilify [Aripiprazole] Palpitations   Demerol [Meperidine Hcl] Hives   Tape Rash    Other reaction(s): Other (See Comments) Itching, skin tear  Immunization History  Administered Date(s) Administered   Hepatitis A, Ped/Adol-2 Dose 12/22/2005, 07/31/2006   Hepatitis B, PED/ADOLESCENT 01/23/2002, 02/26/2002, 07/23/2002   Influenza Split 05/30/2015   Influenza, High Dose Seasonal PF 04/06/2006, 03/09/2009, 02/16/2020, 04/14/2020   Influenza,inj,Quad PF,6+ Mos 04/05/2006, 03/09/2009, 04/14/2020   PFIZER Comirnaty(Gray Top)Covid-19 Tri-Sucrose Vaccine 09/30/2020   PFIZER(Purple Top)SARS-COV-2 Vaccination 07/12/2019, 08/08/2019, 04/28/2020   Td (Adult), 2 Lf Tetanus Toxid, Preservative Free  06/14/2002, 04/30/2009   Tdap 02/07/2022   Varicella 12/22/2005, 04/06/2006    Past Medical History:  Diagnosis Date   Cardiomyopathy (HCC)    a.) TTE 08/04/2021: EF 50%; b.) TTE 10/22/2021: EF 45-50-%; c.) TTE 08/22/2022: EF 55-60%   Chest pain    CHF (congestive heart failure) (HCC)    a.) TTE 08/04/2021: EF 50%, mild LVH, RVSF norm; b.) TTE 10/22/2021: EF 45-50%, glob HK, RVSF norm; c.) TTE 08/22/2022: EF 55-60%, mild dil LV, RVSF norm.   Conversion disorder    Current use of long term anticoagulation    Dyspnea    GERD (gastroesophageal reflux disease)    Hepatic steatosis    Hyperlipidemia    Hypertension    Insomnia    a.) melatonin PRN   Long term current use of anticoagulant    a.) apixaban   OSA on CPAP    Persistent atrial fibrillation and flutter (HCC)    a.) CHA2DS2VASc = 5 (CHF, HTN, TIA x 2, T2DM);  b.) rate/rhythm maintained on oral diltiazem + metoprolol succinate; chronically anticoagulated with apixaban   Schizoaffective disorder (HCC)    Seizure disorder (HCC)    T2DM (type 2 diabetes mellitus) (HCC)    TIA (transient ischemic attack)    Urethral stricture    a. 08/2021 s/p cystoscopy and urethral dilation.    Tobacco History: Social History   Tobacco Use  Smoking Status Former   Current packs/day: 0.00   Types: Cigarettes   Start date: 08/2019   Quit date: 08/2021   Years since quitting: 1.4  Smokeless Tobacco Never   Counseling given: Not Answered   Outpatient Medications Prior to Visit  Medication Sig Dispense Refill   acetaminophen (TYLENOL) 325 MG tablet Take 2 tablets (650 mg total) by mouth every 4 (four) hours as needed. 100 tablet 2   albuterol (VENTOLIN HFA) 108 (90 Base) MCG/ACT inhaler Inhale 2 puffs into the lungs every 6 (six) hours as needed for wheezing. 1 each 2   allopurinol (ZYLOPRIM) 300 MG tablet Take 1 tablet (300 mg total) by mouth daily. 30 tablet 0   atorvastatin (LIPITOR) 80 MG tablet Take 80 mg by mouth daily.      baclofen (LIORESAL) 10 MG tablet Take 1 tablet (10 mg total) by mouth 2 (two) times daily as needed for muscle spasms. Home med. (Patient taking differently: Take 10 mg by mouth 2 (two) times daily. Home med.) 30 each 0   benztropine (COGENTIN) 1 MG tablet Take 1 mg by mouth daily.     cyclobenzaprine (FLEXERIL) 5 MG tablet Take 5 mg by mouth 3 (three) times daily as needed for muscle spasms.     dapagliflozin propanediol (FARXIGA) 10 MG TABS tablet Take 1 tablet (10 mg total) by mouth daily before breakfast. 30 tablet 5   diltiazem (CARDIZEM CD) 360 MG 24 hr capsule Take 1 capsule (360 mg total) by mouth daily. 30 capsule 5   divalproex (DEPAKOTE) 500 MG DR tablet Take 2 tablets (1,000 mg total) by mouth 2 (two) times daily. (Patient taking differently: Take 1,000  mg by mouth daily at 6 (six) AM.)     doxycycline (VIBRA-TABS) 100 MG tablet Take 100 mg by mouth 2 (two) times daily.     ELIQUIS 5 MG TABS tablet Take 5 mg by mouth 2 (two) times daily.     escitalopram (LEXAPRO) 10 MG tablet Take 20 mg by mouth daily.     fluticasone (FLONASE) 50 MCG/ACT nasal spray Place 2 sprays into both nostrils 2 (two) times daily.     furosemide (LASIX) 20 MG tablet Take 1 tablet (20 mg total) by mouth daily. 30 tablet 5   hydrOXYzine (ATARAX) 25 MG tablet Take 25 mg by mouth daily. At noon     hydrOXYzine (ATARAX) 50 MG tablet Take 50 mg by mouth in the morning and at bedtime.     INVEGA 9 MG 24 hr tablet Take 9 mg by mouth every morning.     lithium carbonate (LITHOBID) 300 MG CR tablet Take 600 mg by mouth every evening.     loratadine (CLARITIN) 10 MG tablet Take 10 mg by mouth daily.     melatonin 3 MG TABS tablet Take 3 mg by mouth at bedtime.     metoprolol succinate (TOPROL-XL) 100 MG 24 hr tablet TAKE 1 AND 1/2 TABLET (150MG ) BY MOUTH ONCE DAILY  WITH OR IMMEDIATELY FOLLOWING A MEAL. 42 tablet 9   omeprazole (PRILOSEC) 20 MG capsule Take 20 mg by mouth daily.     ondansetron (ZOFRAN-ODT) 4 MG  disintegrating tablet Take 1 tablet (4 mg total) by mouth every 8 (eight) hours as needed. 20 tablet 0   propranolol (INDERAL) 10 MG tablet Take 10 mg by mouth 2 (two) times daily.     sacubitril-valsartan (ENTRESTO) 24-26 MG TAKE 1 TABLET BY MOUTH TWICE DAILY 60 tablet 11   spironolactone (ALDACTONE) 25 MG tablet Take 1 tablet (25 mg total) by mouth daily. 90 tablet 3   tamsulosin (FLOMAX) 0.4 MG CAPS capsule Take 1 capsule (0.4 mg total) by mouth daily. 30 capsule 2   No facility-administered medications prior to visit.     Review of Systems:   Constitutional: No night sweats, fevers, chills,or lassitude. +weight loss, fatigue  HEENT: No difficulty swallowing, tooth/dental problems, or sore throat. No sneezing, itching, ear ache, nasal congestion, or post nasal drip. +headaches  CV:  +chest pain, orthopnea, PND, dizziness, palpitations. No swelling in lower extremities, anasarca, syncope Resp: +shortness of breath with exertion and at rest. No excess mucus or change in color of mucus. No productive or non-productive. No hemoptysis. No wheezing.  No chest wall deformity GI:  No heartburn, indigestion Skin: No rash, lesions, ulcerations MSK:  +left neck pain, left arm pain Neuro: No gait abnormalities, no memory impairment Psych: +depression, anxiety. No SI/hI. Mood stable.     Physical Exam:  BP 128/86 (BP Location: Left Arm, Cuff Size: Large)   Pulse 96   Temp 97.9 F (36.6 C)   Ht 6\' 4"  (1.93 m)   Wt (!) 361 lb 6.4 oz (163.9 kg)   SpO2 99%   BMI 43.99 kg/m   GEN: Pleasant, interactive, well-kempt; morbidly obese; in no acute distress. HEENT:  Normocephalic and atraumatic. PERRLA. Sclera white. Oropharynx pink and moist, without exudate or edema. No lesions, ulcerations, or postnasal drip.  NECK:  Supple w/ fair ROM. No JVD present. No lymphadenopathy.   CV: RRR, no m/r/g, no peripheral edema. Pulses intact, +2 bilaterally. No cyanosis, pallor or clubbing. PULMONARY:   Unlabored, regular breathing. Clear bilaterally  A&P w/o wheezes/rales/rhonchi. No accessory muscle use.  GI: BS present and normoactive. Soft, non-tender to palpation. No organomegaly or masses detected.  MSK: No erythema, warmth or tenderness. Cap refil <2 sec all extrem.  Neuro: A/Ox3. No focal deficits noted.   Skin: Warm, no lesions or rashe Psych: Normal behavior. Judgement and thought content appropriate.     Lab Results:  CBC    Component Value Date/Time   WBC 8.1 02/10/2023 1516   RBC 4.86 02/10/2023 1516   HGB 13.6 02/10/2023 1516   HGB 11.9 (L) 04/29/2013 0838   HCT 41.7 02/10/2023 1516   HCT 36.0 (L) 04/29/2013 0838   PLT 218 02/10/2023 1516   PLT 257 04/29/2013 0838   MCV 85.8 02/10/2023 1516   MCV 80 04/29/2013 0838   MCH 28.0 02/10/2023 1516   MCHC 32.6 02/10/2023 1516   RDW 14.0 02/10/2023 1516   RDW 14.1 04/29/2013 0838   LYMPHSABS 1.7 02/02/2023 0253   MONOABS 0.8 02/02/2023 0253   EOSABS 0.2 02/02/2023 0253   BASOSABS 0.0 02/02/2023 0253    BMET    Component Value Date/Time   NA 135 02/02/2023 0253   NA 136 04/29/2013 0838   K 3.9 02/02/2023 0253   K 4.0 04/29/2013 0838   CL 95 (L) 02/02/2023 0253   CL 106 04/29/2013 0838   CO2 28 02/02/2023 0253   CO2 25 04/29/2013 0838   GLUCOSE 113 (H) 02/02/2023 0253   GLUCOSE 105 (H) 04/29/2013 0838   BUN 16 02/02/2023 0253   BUN 15 04/29/2013 0838   CREATININE 0.93 02/02/2023 0253   CREATININE 0.99 04/29/2013 0838   CALCIUM 9.1 02/02/2023 0253   CALCIUM 9.2 04/29/2013 0838   GFRNONAA >60 02/02/2023 0253   GFRNONAA >60 04/29/2013 0838   GFRAA >60 04/29/2013 0838    BNP    Component Value Date/Time   BNP 39.7 12/01/2022 2217     Imaging:  MR BRAIN WO CONTRAST  Result Date: 02/02/2023 CLINICAL DATA:  32 year old male code stroke presentation today. Atrial fibrillation on Eliquis. Headache. Left side weakness. EXAM: MRI HEAD WITHOUT CONTRAST TECHNIQUE: Multiplanar, multiecho pulse sequences of  the brain and surrounding structures were obtained without intravenous contrast. COMPARISON:  Head CT 0229 hours today, CTA head and neck. Previous brain MRI 05/22/2022. FINDINGS: Intermittent mild motion artifact today despite repeated imaging attempts. Brain: Cerebral volume is stable and within normal limits. No restricted diffusion to suggest acute infarction. No midline shift, mass effect, evidence of mass lesion, ventriculomegaly, extra-axial collection or acute intracranial hemorrhage. Cervicomedullary junction and pituitary are within normal limits. No encephalomalacia identified, gray and white matter signal appears stable and within normal limits throughout the brain when allowing for mild motion. No convincing chronic cerebral blood products on SWI. Deep gray nuclei, brainstem and cerebellum appear negative. Vascular: Major intracranial vascular flow voids are stable from last year. Skull and upper cervical spine: Negative visible cervical spine. Visualized bone marrow signal is within normal limits. Sinuses/Orbits: Grossly stable, negative orbits. Paranasal sinuses and mastoids are clear. Other: Grossly negative visible internal auditory structures. Negative visible scalp and face. IMPRESSION: Stable and normal noncontrast MRI appearance of the Brain when allowing for mild motion artifact. Electronically Signed   By: Odessa Fleming M.D.   On: 02/02/2023 04:38   CT ANGIO HEAD NECK W WO CM W PERF (CODE STROKE)  Result Date: 02/02/2023 CLINICAL DATA:  Initial evaluation for neuro deficit, stroke. EXAM: CT ANGIOGRAPHY HEAD AND NECK CT PERFUSION BRAIN TECHNIQUE: Multidetector CT  imaging of the head and neck was performed using the standard protocol during bolus administration of intravenous contrast. Multiplanar CT image reconstructions and MIPs were obtained to evaluate the vascular anatomy. Carotid stenosis measurements (when applicable) are obtained utilizing NASCET criteria, using the distal internal carotid  diameter as the denominator. Multiphase CT imaging of the brain was performed following IV bolus contrast injection. Subsequent parametric perfusion maps were calculated using RAPID software. RADIATION DOSE REDUCTION: This exam was performed according to the departmental dose-optimization program which includes automated exposure control, adjustment of the mA and/or kV according to patient size and/or use of iterative reconstruction technique. CONTRAST:  OMNIPAQUE IOHEXOL 350 MG/ML SOLN COMPARISON:  Prior study from earlier the same day as well as earlier exams. FINDINGS: CTA NECK FINDINGS Aortic arch: Standard branching. Imaged portion shows no evidence of aneurysm or dissection. No significant stenosis of the major arch vessel origins. Right carotid system: No evidence of dissection, stenosis (50% or greater) or occlusion. Left carotid system: No evidence of dissection, stenosis (50% or greater) or occlusion. Vertebral arteries: Both vertebral arteries arise from the subclavian arteries. Proximal right vertebral artery somewhat limited evaluation due to motion. Visualized portions of the vertebral arteries patent without stenosis or dissection. Skeleton: No discrete or worrisome osseous lesions. Other neck: No other acute finding. Upper chest: Hazy ground-glass density within the visualized lungs, likely mild pulmonary interstitial congestion. Review of the MIP images confirms the above findings CTA HEAD FINDINGS Anterior circulation: Both internal carotid arteries widely patent to the termini without stenosis. A1 segments patent bilaterally. Normal anterior communicating complex. Anterior cerebral arteries patent without proximal stenosis. No M1 stenosis or occlusion. No proximal MCA branch occlusion or high-grade stenosis. Distal MCA branches perfused and symmetric. Question diffuse distal small vessel irregularity. Posterior circulation: Both V4 segments patent without stenosis. Both PICA patent. Basilar  patent without stenosis. Superior cerebellar arteries patent bilaterally. Left PCA supplied via the basilar. Fetal type origin of the right PCA. Both PCAs patent to their distal aspects without stenosis. Question distal small vessel irregularity. Venous sinuses: Patent allowing for timing the contrast bolus. Anatomic variants: Fetal type right PCA.  No aneurysm. Review of the MIP images confirms the above findings CT Brain Perfusion Findings: ASPECTS: 10 CBF (<30%) Volume: 0mL Perfusion (Tmax>6.0s) volume: 0mL Mismatch Volume: 0mL Infarction Location:Negative CT perfusion with no evidence for acute ischemia or other perfusion deficit. IMPRESSION: 1. Negative CTA of the head and neck for large vessel occlusion or other emergent finding. 2. Negative CT perfusion with no evidence for acute ischemia or other perfusion deficit. 3. Question distal small vessel atheromatous irregularity within the intracranial circulation. While this finding could be artifactual, possible premature atherosclerotic disease or possibly vasculopathy could also be considered. Electronically Signed   By: Rise Mu M.D.   On: 02/02/2023 04:06   CT HEAD CODE STROKE WO CONTRAST  Result Date: 02/02/2023 CLINICAL DATA:  Code stroke. Initial evaluation for neuro deficit, stroke suspected. EXAM: CT HEAD WITHOUT CONTRAST TECHNIQUE: Contiguous axial images were obtained from the base of the skull through the vertex without intravenous contrast. RADIATION DOSE REDUCTION: This exam was performed according to the departmental dose-optimization program which includes automated exposure control, adjustment of the mA and/or kV according to patient size and/or use of iterative reconstruction technique. COMPARISON:  None Available. FINDINGS: Brain: Cerebral volume within normal limits for patient age. No evidence for acute intracranial hemorrhage. No findings to suggest acute large vessel territory infarct. No mass lesion, midline shift, or mass  effect. Ventricles are normal in size without evidence for hydrocephalus. No extra-axial fluid collection identified. Vascular: No hyperdense vessel identified. Skull: Scalp soft tissues demonstrate no acute abnormality. Calvarium intact. Sinuses/Orbits: Globes and orbital soft tissues within normal limits. Visualized paranasal sinuses are clear. No mastoid effusion. ASPECTS [facility] Stroke Program Early CT Score) - Ganglionic level infarction (caudate, lentiform nuclei, internal capsule, insula, M1-M3 cortex): 7 - Supraganglionic infarction (M4-M6 cortex): 3 Total score (0-10 with 10 being normal): 10 IMPRESSION: 1. Normal head CT.  No acute intracranial abnormality. 2. ASPECTS is 10. Critical Value/emergent results were called by telephone at the time of interpretation on 02/02/2023 at 2:33 am to provider [facility] , who verbally acknowledged these results. Electronically Signed   By: Rise Mu M.D.   On: 02/02/2023 02:34   DG Chest 2 View  Result Date: 01/23/2023 CLINICAL DATA:  Bilateral leg swelling EXAM: CHEST - 2 VIEW COMPARISON:  Chest radiograph 01/14/2023 FINDINGS: The cardiomediastinal silhouette is stable and within normal limits There is no focal consolidation or pulmonary edema. There is no pleural effusion or pneumothorax There is no acute osseous abnormality. IMPRESSION: No radiographic evidence of acute cardiopulmonary process. Electronically Signed   By: Lesia Hausen M.D.   On: 01/23/2023 18:50   DG Chest 2 View  Result Date: 01/14/2023 CLINICAL DATA:  Shortness of breath EXAM: CHEST - 2 VIEW COMPARISON:  Chest x-ray 12/01/2022 FINDINGS: The heart size and mediastinal contours are within normal limits. Both lungs are clear. The visualized skeletal structures are unremarkable. IMPRESSION: No active cardiopulmonary disease. Electronically Signed   By: Darliss Cheney M.D.   On: 01/14/2023 19:11    Administration History     None          Latest Ref Rng & Units 07/28/2022     2:05 PM  PFT Results  FVC-Pre L 4.68   FVC-Predicted Pre % 70   Pre FEV1/FVC % % 86   FEV1-Pre L 4.02   FEV1-Predicted Pre % 75   DLCO uncorrected ml/min/mmHg 36.73   DLCO UNC% % 92   DLVA Predicted % 122   TLC L 6.91   TLC % Predicted % 83   RV % Predicted % 130     No results found for: "NITRICOXIDE"      Assessment & Plan:   Chest pain Acute chest pain radiating to left arm and neck. EKG nl without arrhythmia or t wave abnormalities. Advised further evaluation in ED to rule out ACS/acute etiology. VS were stable so pt was transported to ED in a wheelchair with CMA and oxygen.   DOE (dyspnea on exertion) See above. Possible asthmatic component to dyspnea. Does receive benefit from SABA. Once he is discharged, will trial him on ICS/LABA therapy. Medication education provided today.   Severe sleep apnea Waiting BiPAP orders. Will f/u on this today. Aware of risks of untreated OSA. Does not drive.  Chronic hypoxemic respiratory failure (HCC) Continue supplemental oxygen at night. Goal >88-90%  Chronic atrial fibrillation with RVR (HCC) NSR today   I spent 45 minutes of dedicated to the care of this patient on the date of this encounter to include pre-visit review of records, face-to-face time with the patient discussing conditions above, post visit ordering of testing, clinical documentation with the electronic health record, making appropriate referrals as documented, and communicating necessary findings to members of the patients care team.  Noemi Chapel, NP 02/10/2023  Pt aware and understands NP's role.

## 2023-02-10 NOTE — ED Notes (Signed)
This RN attempted blood draw. Unsuccessful. Lab called.

## 2023-02-10 NOTE — Assessment & Plan Note (Signed)
Waiting BiPAP orders. Will f/u on this today. Aware of risks of untreated OSA. Does not drive.

## 2023-02-10 NOTE — Assessment & Plan Note (Signed)
See above. Possible asthmatic component to dyspnea. Does receive benefit from SABA. Once he is discharged, will trial him on ICS/LABA therapy. Medication education provided today.

## 2023-02-10 NOTE — Assessment & Plan Note (Signed)
NSR today

## 2023-02-10 NOTE — ED Notes (Signed)
Pt ambulatory around the lobby pushing his wheel chair. Pt moved to triage 1 for repeat labs and vitals.

## 2023-02-11 ENCOUNTER — Emergency Department
Admission: EM | Admit: 2023-02-11 | Discharge: 2023-02-11 | Disposition: A | Discharge status: Home / Self Care | Payer: MEDICAID | Attending: Emergency Medicine | Admitting: Emergency Medicine

## 2023-02-11 DIAGNOSIS — R0789 Other chest pain: Secondary | ICD-10-CM

## 2023-02-11 DIAGNOSIS — E860 Dehydration: Secondary | ICD-10-CM

## 2023-02-11 DIAGNOSIS — R002 Palpitations: Secondary | ICD-10-CM

## 2023-02-11 MED ORDER — DILTIAZEM HCL ER COATED BEADS 180 MG PO CP24
180.0000 mg | ORAL_CAPSULE | Freq: Every day | ORAL | Status: DC
  Administered 2023-02-11: 180 mg via ORAL
  Filled 2023-02-11: qty 1

## 2023-02-11 MED ORDER — DILTIAZEM HCL ER COATED BEADS 360 MG PO CP24: 360.0000 mg | ORAL_CAPSULE | Freq: Every day | ORAL | 0 refills | Status: DC

## 2023-02-11 MED ORDER — OXYCODONE-ACETAMINOPHEN 5-325 MG PO TABS
1.0000 | ORAL_TABLET | Freq: Once | ORAL | Status: AC
  Administered 2023-02-11: 1 via ORAL
  Filled 2023-02-11: qty 1

## 2023-02-11 NOTE — ED Notes (Signed)
Daniel [name] was called and was informed pt needs a ride home. Daniel [name] agreed to pick him up from ER.

## 2023-02-11 NOTE — Discharge Instructions (Addendum)
For the next 2 days, go back to JUST your Lasix normal dose  Then, you can restart the SPIRONOLACTONE that was prescribed  START taking the Diltiazem again. I've prescribed a 30 day supply to fill at whatever pharmacy you can until your usual dose is called in. DO NOT take more than 1 of these tablets daily and do not accidentally take 2 when your other prescription finally comes in. You can throw the extra pills out at that time.

## 2023-02-11 NOTE — ED Provider Notes (Signed)
[facility] Provider Note    Event Date/Time   First MD Initiated Contact with Patient 02/11/23 [phone]     (approximate)   History   Chest Pain and Shortness of Breath   HPI  Daniel [name] is a 32 y.o. male  well known to this ED here with multiple complaints.  Patient's primary complaint is that he has been out of his diltiazem for the last week because his pharmacy could not get it filled.  Since then, he has had been having intermittent palpitations and chest pain with shortness of breath.  The patient also states that he has been having episodes of feeling mildly lightheaded with standing.  He was recently started on spironolactone and had to increase his Lasix.  Denies any fevers or chills.  No current chest pain at rest.  No other complaints.     Physical Exam   Triage Vital Signs: ED Triage Vitals [02/10/23 1507]  Encounter Vitals Group     BP (!) 112/92     Systolic BP Percentile      Diastolic BP Percentile      Pulse Rate 97     Resp 18     Temp 98.3 F (36.8 C)     Temp Source Oral     SpO2 100 %     Weight (!) 361 lb 5.3 oz (163.9 kg)     Height 6\' 4"  (1.93 m)     Head Circumference      Peak Flow      Pain Score 10     Pain Loc      Pain Education      Exclude from Growth Chart     Most recent vital signs: Vitals:   02/11/23 0118 02/11/23 0134  BP: 114/60   Pulse: 82   Resp: 20   Temp: 98.2 F (36.8 C) (!) 97.5 F (36.4 C)  SpO2: 100%      General: Awake, no distress.  CV:  Good peripheral perfusion.  Regular rate and rhythm.  No murmurs. Resp:  Normal work of breathing.  Lungs clear to auscultation bilaterally. Abd:  No distention.  No tenderness. Other:  Calm, in no distress.   ED Results / Procedures / Treatments   Labs (all labs ordered are listed, but only abnormal results are displayed) Labs Reviewed  BASIC METABOLIC PANEL - Abnormal; Notable for the following components:      Result Value   Chloride  96 (*)    Glucose, Bld 106 (*)    All other components within normal limits  CBC  TROPONIN I (HIGH SENSITIVITY)  TROPONIN I (HIGH SENSITIVITY)     EKG Normal sinus rhythm, ventricular rate 93.  PR 132, QRS 98, QTc 445.  No acute ST elevations or depressions.  No acute events of acute ischemia or infarct.   RADIOLOGY Chest x-ray: No acute findings   I also independently reviewed and agree with radiologist interpretations.   PROCEDURES:  Critical Care performed: No   MEDICATIONS ORDERED IN ED: Medications  diltiazem (CARDIZEM CD) 24 hr capsule 180 mg (180 mg Oral Given 02/11/23 0125)  oxyCODONE-acetaminophen (PERCOCET/ROXICET) 5-325 MG per tablet 1 tablet (1 tablet Oral Given 02/11/23 0125)     IMPRESSION / MDM / ASSESSMENT AND PLAN / ED COURSE  I reviewed the triage vital signs and the nursing notes.  Differential diagnosis includes, but is not limited to, palpitations, atrial fibrillation, acute on chronic chest pain, anxiety, dehydration  Patient's presentation is most consistent with acute presentation with potential threat to life or bodily function.  The patient is on the cardiac monitor to evaluate for evidence of arrhythmia and/or significant heart rate changes   32 year old male well-known to this ED with multiple chronic comorbidities including CHF, ACS, chronic obesity, A-fib, here with palpitations and general fatigue.  Regarding his palpitations, I suspect these are due to being out of his home diltiazem.  I have written for a paper prescription for this.  He has no evidence of RVR here and he was given a half dose of his usual diltiazem.  Otherwise, the patient symptoms are largely chronic.  They are well-documented.  His EKG is nonischemic and troponins are negative x 2 with no signs suggest ACS.  CBC is without leukocytosis or anemia.  BMP is at baseline.  Patient does report some mild lightheadedness with standing and recently had  his Lasix doubled as well as spironolactone started.  Will have him cut back to his usual dose and then restart spironolactone after 2 days of hydrating.  Return precautions given.   FINAL CLINICAL IMPRESSION(S) / ED DIAGNOSES   Final diagnoses:  Atypical chest pain  Palpitations  Dehydration     Rx / DC Orders   ED Discharge Orders          Ordered    diltiazem (CARDIZEM CD) 360 MG 24 hr capsule  Daily        02/11/23 0345             Note:  This document was prepared using Dragon voice recognition software and may include unintentional dictation errors.   Shaune Pollack, MD 02/11/23 [phone]

## 2023-02-24 ENCOUNTER — Emergency Department
Admission: EM | Admit: 2023-02-24 | Discharge: 2023-02-24 | Disposition: A | Discharge status: Home / Self Care | Payer: MEDICAID | Attending: Emergency Medicine | Admitting: Emergency Medicine

## 2023-02-24 ENCOUNTER — Emergency Department: Payer: MEDICAID

## 2023-02-24 ENCOUNTER — Encounter: Payer: Self-pay | Admitting: Intensive Care

## 2023-02-24 ENCOUNTER — Other Ambulatory Visit: Payer: Self-pay

## 2023-02-24 DIAGNOSIS — R0789 Other chest pain: Secondary | ICD-10-CM | POA: Diagnosis not present

## 2023-02-24 DIAGNOSIS — N3000 Acute cystitis without hematuria: Secondary | ICD-10-CM | POA: Insufficient documentation

## 2023-02-24 DIAGNOSIS — E119 Type 2 diabetes mellitus without complications: Secondary | ICD-10-CM | POA: Insufficient documentation

## 2023-02-24 DIAGNOSIS — I509 Heart failure, unspecified: Secondary | ICD-10-CM | POA: Insufficient documentation

## 2023-02-24 DIAGNOSIS — R079 Chest pain, unspecified: Secondary | ICD-10-CM | POA: Diagnosis present

## 2023-02-24 LAB — CBC
HCT: 39.8 % (ref 39.0–52.0)
Hemoglobin: 13.1 g/dL (ref 13.0–17.0)
MCH: 28.1 pg (ref 26.0–34.0)
MCHC: 32.9 g/dL (ref 30.0–36.0)
MCV: 85.4 fL (ref 80.0–100.0)
Platelets: 284 10*3/uL (ref 150–400)
RBC: 4.66 MIL/uL (ref 4.22–5.81)
RDW: 14.2 % (ref 11.5–15.5)
WBC: 8.9 10*3/uL (ref 4.0–10.5)
nRBC: 0 % (ref 0.0–0.2)

## 2023-02-24 LAB — BASIC METABOLIC PANEL
Anion gap: 12 (ref 5–15)
BUN: 15 mg/dL (ref 6–20)
CO2: 28 mmol/L (ref 22–32)
Calcium: 8.9 mg/dL (ref 8.9–10.3)
Chloride: 95 mmol/L — ABNORMAL LOW (ref 98–111)
Creatinine, Ser: 1.02 mg/dL (ref 0.61–1.24)
GFR, Estimated: >60 mL/min (ref >60)
Glucose, Bld: 105 mg/dL — ABNORMAL HIGH (ref 70–99)
Potassium: 4 mmol/L (ref 3.5–5.1)
Sodium: 135 mmol/L (ref 135–145)

## 2023-02-24 LAB — PROTIME-INR
INR: 1.1 (ref 0.8–1.2)
Prothrombin Time: 14.5 s (ref 11.4–15.2)

## 2023-02-24 LAB — URINALYSIS, ROUTINE W REFLEX MICROSCOPIC
Bacteria, UA: NONE SEEN
Bilirubin Urine: NEGATIVE
Glucose, UA: >=500 mg/dL — AB
Hgb urine dipstick: NEGATIVE
Ketones, ur: NEGATIVE mg/dL
Nitrite: NEGATIVE
Protein, ur: NEGATIVE mg/dL
Specific Gravity, Urine: 1.017 (ref 1.005–1.030)
WBC, UA: >50 WBC/hpf (ref 0–5)
pH: 6 (ref 5.0–8.0)

## 2023-02-24 LAB — TROPONIN I (HIGH SENSITIVITY)
Troponin I (High Sensitivity): <2 ng/L (ref <18)
Troponin I (High Sensitivity): <2 ng/L (ref <18)

## 2023-02-24 MED ORDER — CEFPODOXIME PROXETIL 200 MG PO TABS: 200.0000 mg | ORAL_TABLET | Freq: Two times a day (BID) | ORAL | 0 refills | Status: AC

## 2023-02-24 NOTE — ED Provider Notes (Signed)
[facility] Provider Note    Event Date/Time   First MD Initiated Contact with Patient 02/24/23 2258     (approximate)   History   Chest Pain and Back Pain   HPI Daniel [name] is a 32 y.o. male who is well-known to the emergency department due to chronic heart failure and other cardiac issues as well as atypical and chronic chest pain and schizoaffective disorder, diabetes, etc.  Today is his ninth ED visit in 6 months with no admissions.  He presents tonight complaining of pain in his chest that goes to his left shoulder that feels the same as it always does.  There does not seem to be anything new or different about it, he just wanted to make sure everything is okay.  He denies shortness of breath, nausea, vomiting.  He also reports pain when he urinates and a difficulty completely emptying his bladder.  He says he has an appointment with Dr. Lonna Cobb at some point within the next couple of weeks to have a cystoscopy but he is not sure of the exact date or why he is having the test.  He said that sometimes his lower abdomen hurts but it mostly hurts when he urinates.     Physical Exam   Triage Vital Signs: ED Triage Vitals  Encounter Vitals Group     BP 02/24/23 1830 130/70     Systolic BP Percentile --      Diastolic BP Percentile --      Pulse Rate 02/24/23 1830 80     Resp 02/24/23 1830 16     Temp 02/24/23 1830 98.8 F (37.1 C)     Temp Source 02/24/23 2259 Oral     SpO2 02/24/23 1830 90 %     Weight 02/24/23 1831 (!) 163.3 kg (360 lb)     Height 02/24/23 1831 1.93 m (6\' 4" )     Head Circumference --      Peak Flow --      Pain Score 02/24/23 1830 10     Pain Loc --      Pain Education --      Exclude from Growth Chart --     Most recent vital signs: Vitals:   02/24/23 2140 02/24/23 2259  BP: (!) 121/56   Pulse: 68   Resp: 16   Temp:  98.6 F (37 C)  SpO2: 97%     General: Awake, no obvious distress, waiting and talking with  everyone in the emergency department, eating a meal and multiple snacks, milks, and juice boxes while awaiting evaluation. CV:  Good peripheral perfusion.  Regular rate and rhythm.  Normal heart sounds. Resp:  Normal effort. Speaking easily and comfortably, no accessory muscle usage nor intercostal retractions.  Lungs clear to auscultation. Abd:  No distention.  Morbid obesity.  Tolerating a substantial amount of oral intake while in the ED.  No tenderness to palpation throughout the abdomen, no rebound or guarding. Other:  Mood and affect are normal for him, pleasant and communicative, no warning signs or symptoms.   ED Results / Procedures / Treatments   Labs (all labs ordered are listed, but only abnormal results are displayed) Labs Reviewed  BASIC METABOLIC PANEL - Abnormal; Notable for the following components:      Result Value   Chloride 95 (*)    Glucose, Bld 105 (*)    All other components within normal limits  URINALYSIS, ROUTINE W REFLEX MICROSCOPIC - Abnormal; Notable  for the following components:   Color, Urine YELLOW (*)    APPearance HAZY (*)    Glucose, UA >=500 (*)    Leukocytes,Ua LARGE (*)    All other components within normal limits  URINE CULTURE  CBC  PROTIME-INR  TROPONIN I (HIGH SENSITIVITY)  TROPONIN I (HIGH SENSITIVITY)    EKG  ED ECG REPORT I, Loleta Rose, the attending physician, personally viewed and interpreted this ECG.  Date: 02/24/2023 EKG Time: 18: 33 Rate: 79 Rhythm: normal sinus rhythm QRS Axis: normal Intervals: Borderline QTc prolongation at 499 ms ST/T Wave abnormalities: Non-specific ST segment / T-wave changes, but no clear evidence of acute ischemia.  Specifically there is some T wave inversion in lead III which is the most notable abnormality. Narrative Interpretation: no definitive evidence of acute ischemia; does not meet STEMI criteria.    RADIOLOGY I viewed and interpreted the patient's two-view chest x-ray.  There is no  evidence of volume overload or infection.  I also read the radiologist's report, which confirmed no acute findings.   PROCEDURES:  Critical Care performed: No  Procedures    IMPRESSION / MDM / ASSESSMENT AND PLAN / ED COURSE  I reviewed the triage vital signs and the nursing notes.                              Differential diagnosis includes, but is not limited to, chronic pain, UTI, pyuria, renal/ureteral stone, PE, ACS, volume overload/CHF exacerbation.  Patient's presentation is most consistent with acute presentation with potential threat to life or bodily function.  Labs/studies ordered: EKG, two-view chest x-ray, high-sensitivity troponin, urine culture, urinalysis, BMP, pro time-INR, CBC  Interventions/Medications given:  Medications - No data to display  (Note:  hospital course my include additional interventions and/or labs/studies not listed above.)   Negative troponin and reassuring EKG, chronic chest pain no different from prior.  No indication of acute or emergent cardiac or pulmonary exacerbation.  Patient has pyuria with large leukocytes and greater than 50 WBCs in his urine though with no bacteria seen.  I looked back through his records and at least the last 2 urine cultures did not come back with a specific pathogen.  Regardless, given the patient's reported dysuria and his leukocytes and WBCs in the urine, I will treat empirically with cefpodoxime given that I think it might be a safer option for him for complicated UTI than would Cipro.  I strongly encouraged him to follow-up with Dr. Lonna Cobb to see when his visit is scheduled and to follow-up as planned.  He agrees with the plan.         FINAL CLINICAL IMPRESSION(S) / ED DIAGNOSES   Final diagnoses:  Acute cystitis without hematuria  Atypical chest pain     Rx / DC Orders   ED Discharge Orders          Ordered    cefpodoxime (VANTIN) 200 MG tablet  2 times daily        02/24/23 2318              Note:  This document was prepared using Dragon voice recognition software and may include unintentional dictation errors.   Loleta Rose, MD 02/25/23 [phone]

## 2023-02-24 NOTE — Discharge Instructions (Signed)
You may have a mild urinary tract infection.  You need to follow-up with Dr. Lonna Cobb as previously scheduled.  Please check with his office to see when your appointment is scheduled.  Take the prescribed course of antibiotics as written as well as your regular medications.

## 2023-02-24 NOTE — ED Triage Notes (Addendum)
Patient c/o chest pain with emesis that radiates into left shoulder and neck. Also reports lower back pain and burning during urination

## 2023-02-24 NOTE — ED Notes (Addendum)
This RN in triage and was informed by the security staff that the patient was needing assistance. Along with Edwena Felty, RN the patient was assessed and he stated " I feel like I'm about to have seizure". Pt is in NAD and is unable to provide further description of sx. Pt was informed that he was the next patient to be taken back to be seen by a provider in which the patient responds with "Ok I can wait".

## 2023-02-24 NOTE — ED Notes (Signed)
Daniel [name] (pt caregiver) contacted and is currently on the way to pick up pt.

## 2023-02-24 NOTE — ED Notes (Signed)
Pt taken to the room via wheelchair and attached to the monitor; primary RN made aware of the patients arrival.

## 2023-02-25 NOTE — ED Provider Notes (Incomplete)
[facility] Provider Note    Event Date/Time   First MD Initiated Contact with Patient 02/24/23 2258     (approximate)   History   Chest Pain and Back Pain   HPI Daniel [name] is a 32 y.o. male who is well-known to the emergency department due to chronic heart failure and     Physical Exam   Triage Vital Signs: ED Triage Vitals  Encounter Vitals Group     BP 02/24/23 1830 130/70     Systolic BP Percentile --      Diastolic BP Percentile --      Pulse Rate 02/24/23 1830 80     Resp 02/24/23 1830 16     Temp 02/24/23 1830 98.8 F (37.1 C)     Temp Source 02/24/23 2259 Oral     SpO2 02/24/23 1830 90 %     Weight 02/24/23 1831 (!) 163.3 kg (360 lb)     Height 02/24/23 1831 1.93 m (6\' 4" )     Head Circumference --      Peak Flow --      Pain Score 02/24/23 1830 10     Pain Loc --      Pain Education --      Exclude from Growth Chart --     Most recent vital signs: Vitals:   02/24/23 2140 02/24/23 2259  BP: (!) 121/56   Pulse: 68   Resp: 16   Temp:  98.6 F (37 C)  SpO2: 97%     General: Awake, no distress. *** CV:  Good peripheral perfusion. *** Resp:  Normal effort. Speaking easily and comfortably, no accessory muscle usage nor intercostal retractions.  *** Abd:  No distention. *** Other:  ***   ED Results / Procedures / Treatments   Labs (all labs ordered are listed, but only abnormal results are displayed) Labs Reviewed  BASIC METABOLIC PANEL - Abnormal; Notable for the following components:      Result Value   Chloride 95 (*)    Glucose, Bld 105 (*)    All other components within normal limits  URINALYSIS, ROUTINE W REFLEX MICROSCOPIC - Abnormal; Notable for the following components:   Color, Urine YELLOW (*)    APPearance HAZY (*)    Glucose, UA >=500 (*)    Leukocytes,Ua LARGE (*)    All other components within normal limits  URINE CULTURE  CBC  PROTIME-INR  TROPONIN I (HIGH SENSITIVITY)  TROPONIN I (HIGH  SENSITIVITY)    EKG  ED ECG REPORT I, Loleta Rose, the attending physician, personally viewed and interpreted this ECG.  Date: 02/24/2023 EKG Time: 18: 33 Rate: 79 Rhythm: normal sinus rhythm QRS Axis: normal Intervals: Borderline QTc prolongation at 499 ms ST/T Wave abnormalities: Non-specific ST segment / T-wave changes, but no clear evidence of acute ischemia.  Specifically there is some T wave inversion in lead III which is the most notable abnormality. Narrative Interpretation: no definitive evidence of acute ischemia; does not meet STEMI criteria.    RADIOLOGY I viewed and interpreted the patient's two-view chest x-ray.  There is no evidence of volume overload or infection.  I also read the radiologist's report, which confirmed no acute findings.   PROCEDURES:  Critical Care performed: No  Procedures    IMPRESSION / MDM / ASSESSMENT AND PLAN / ED COURSE  I reviewed the triage vital signs and the nursing notes.  Differential diagnosis includes, but is not limited to, ***  Patient's presentation is most consistent with {EM COPA:27473}  Labs/studies ordered: ***  Interventions/Medications given:  Medications - No data to display *** (Note:  hospital course my include additional interventions and/or labs/studies not listed above.)   ***  {**The patient is on the cardiac monitor to evaluate for evidence of arrhythmia and/or significant heart rate changes.**}       FINAL CLINICAL IMPRESSION(S) / ED DIAGNOSES   Final diagnoses:  None     Rx / DC Orders   ED Discharge Orders     None        Note:  This document was prepared using Dragon voice recognition software and may include unintentional dictation errors.

## 2023-02-26 LAB — URINE CULTURE
Culture: 40000 — AB
Special Requests: NORMAL

## 2023-02-28 ENCOUNTER — Encounter: Payer: Self-pay | Admitting: Cardiology

## 2023-02-28 ENCOUNTER — Ambulatory Visit: Payer: MEDICAID | Attending: Cardiology | Admitting: Cardiology

## 2023-02-28 VITALS — BP 122/74 | HR 68 | Ht 76.0 in | Wt 364.8 lb

## 2023-02-28 DIAGNOSIS — I5032 Chronic diastolic (congestive) heart failure: Secondary | ICD-10-CM | POA: Diagnosis not present

## 2023-02-28 DIAGNOSIS — G4730 Sleep apnea, unspecified: Secondary | ICD-10-CM

## 2023-02-28 DIAGNOSIS — I10 Essential (primary) hypertension: Secondary | ICD-10-CM | POA: Diagnosis not present

## 2023-02-28 DIAGNOSIS — I480 Paroxysmal atrial fibrillation: Secondary | ICD-10-CM

## 2023-02-28 DIAGNOSIS — E785 Hyperlipidemia, unspecified: Secondary | ICD-10-CM

## 2023-02-28 NOTE — Progress Notes (Unsigned)
Cardiology Office Note:  .   Date:  02/28/2023  ID:  Daniel [name], DOB 07/08/1990, MRN [id number] PCP: Anselm Jungling, NP  [facility] Providers Cardiologist:  Yvonne Kendall, MD Cardiology APP:  Delma Freeze, FNP { Click to update primary MD,subspecialty MD or APP then REFRESH:1}   History of Present Illness: Daniel [name] is a 32 y.o. male with a past medical history of PAF on chronic anticoagulation, HFimpEF, hypertension, hyperlipidemia, sleep disordered breathing, chronic respiratory failure with hypoxia, seizure disorder, schizoaffective disorder who presents today for follow-up.  Patient with a long history of paroxysmal atrial fibrillation send he was diagnosed at the age of 74.  He was told it was triggered by excessive use of energy drinks.  He states he is not every abuse drugs or alcohol but was a smoker until 09/2021.  He had previously followed in [place] in the past.  There was talk of atrial fibrillation ablation but was never undertaken.  Subsequently moved to group home in [place] where he is now being followed.   Echocardiogram in 09/2021 revealed an LVEF of 50%.  Repeat in 09/2021 revealed an LVEF of 45 to 50%.  Last study revealed LVEF 55 to 60%, with no regional wall motion abnormalities, and no valvular abnormalities.  He recently underwent right heart catheterization 10/07/2022 which showed normal filling pressures, normal PA pressure, and cardiac output.  Multiple emergency department visits in August and September.  He followed up with advanced heart failure 02/06/2023 with [name], NP.  ReDs 46% with increasing symptoms.His furosemide was increased to 40 mg daily x 3 days then decrease back to 20 mg daily, started on spironolactone, labs within a week, and follow-ups were made for him to establish or reestablish care for the things that he needs since moving.  Has had multiple emergency department visits on 02/11/2023 and 02/24/2023 for chest  pain, back pain, and shortness of breath.  It was his ninth emergency department visit in 6 months with no admissions.  He presented complaining of pain in the chest because of his left shoulder that feels the same as it always does.  There is not anything new or different about it he just want to make sure everything was okay.  He also had issues with urination and completely emptying his bladder.  He was following up with urology with an upcoming cystoscopy.  Vital signs in the emergency department were stable with a blood pressure 130/70, pulse of 80, respirations 16, and labs were unremarkable.  No EKG changes were noted.  Negative troponin was reassuring.Daniel [name]  He was sent back to the group home with antibiotics for acute cystitis without hematuria and recommended to keep his follow-up with urology.  He returns to clinic today accompanied by his caregiver. He states that he has been doing well for the cardiac perspective. He denies any chest pain, chest tightness, or palpitations. Exertional dyspnea is present when he longs for an extended period of time. He continues to have chronic peripheral edema he relates to being on his feet throughout the day. States that he is compliant with his medication regimen. He was nervous about being back in atrial fibrillation today. He and his caregiver have voiced concerns or continuing to wait for his Bipap. He has been losing sleep stating that he is scared to die in his sleep without having his machine. His caregiver has called several times and has not gotten much assistance from the DME company.   ROS:  10 point review of system has been completed and considered negative with exception was been listed in HPI  Studies Reviewed: Daniel [name]   EKG Interpretation Date/Time:  Tuesday February 28 2023 13:46:53 EDT Ventricular Rate:  68 PR Interval:  164 QRS Duration:  90 QT Interval:  462 QTC Calculation: 491 R Axis:   36  Text Interpretation: Normal sinus rhythm Prolonged QT  When compared with ECG of 24-Feb-2023 18:33, No significant change was found Confirmed by Charlsie Quest ([id number]) on 02/28/2023 1:54:35 PM  RHC 10/07/22 1. Normal filling pressures.  2. Normal PA pressure.  3. High cardiac output.    No cause for dyspnea noted from this RHC.   TTE 08/22/22 1. Left ventricular ejection fraction, by estimation, is 55 to 60%. The  left ventricle has normal function. The left ventricle has no regional  wall motion abnormalities. The left ventricular internal cavity size was  mildly dilated. Left ventricular  diastolic function could not be evaluated.   2. Right ventricular systolic function is normal. The right ventricular  size is normal. Tricuspid regurgitation signal is inadequate for assessing  PA pressure.   3. The mitral valve is normal in structure. No evidence of mitral valve  regurgitation. No evidence of mitral stenosis.   4. The aortic valve is normal in structure. Aortic valve regurgitation is  not visualized. No aortic stenosis is present.   5. Limited images. Only parasternal images obtained.    TTE 10/22/21 1. Left ventricular ejection fraction, by estimation, is 45 to 50%  (difficult to estimate in the setting of atrial fibrillation with rate  >100). The left ventricle has mildly decreased function. The left  ventricle demonstrates global hypokinesis. Left  ventricular diastolic parameters are indeterminate.   2. Right ventricular systolic function is normal. The right ventricular  size is normal. Tricuspid regurgitation signal is inadequate for assessing  PA pressure.   3. The mitral valve is normal in structure. No evidence of mitral valve  regurgitation. No evidence of mitral stenosis.   4. The aortic valve is normal in structure. Aortic valve regurgitation is  not visualized. No aortic stenosis is present.   5. The inferior vena cava is normal in size with greater than 50%  respiratory variability, suggesting right atrial pressure of 3  mmHg.   TTE 08/04/21 1. Left ventricular ejection fraction, by estimation, is 50%. The left  ventricle has low normal function. Left ventricular endocardial border not  optimally defined to evaluate regional wall motion. There is mild left  ventricular hypertrophy. Left  ventricular diastolic parameters are indeterminate.   2. Right ventricular systolic function was not well visualized. The right  ventricular size is not well visualized.   3. The mitral valve is normal in structure. No evidence of mitral valve  regurgitation.   4. The aortic valve is tricuspid. Aortic valve regurgitation is not  visualized.   Risk Assessment/Calculations:    CHA2DS2-VASc Score = 5  {Confirm score is correct.  If not, click here to update score.  REFRESH note.  :1} This indicates a 7.2% annual risk of stroke. The patient's score is based upon: CHF History: 1 HTN History: 1 Diabetes History: 1 Stroke History: 2 Vascular Disease History: 0 Age Score: 0 Gender Score: 0   {This patient has a significant risk of stroke if diagnosed with atrial fibrillation.  Please consider VKA or DOAC agent for anticoagulation if the bleeding risk is acceptable.   You can also use the SmartPhrase .[email or web address] for documentation.   :  [id number]}         Physical Exam:   VS:  BP 122/74 (BP Location: Left Arm, Patient Position: Sitting, Cuff Size: Large)   Pulse 68   Ht 6\' 4"  (1.93 m)   Wt (!) 364 lb 12.8 oz (165.5 kg)   SpO2 96%   BMI 44.40 kg/m    Wt Readings from Last 3 Encounters:  02/28/23 (!) 364 lb 12.8 oz (165.5 kg)  02/24/23 (!) 360 lb (163.3 kg)  02/10/23 (!) 361 lb 5.3 oz (163.9 kg)    GEN: Well nourished, well developed in no acute distress NECK: No JVD; No carotid bruits CARDIAC: RRR, no murmurs, rubs, gallops RESPIRATORY:  Clear to auscultation without rales, wheezing or rhonchi  ABDOMEN: Soft, non-tender, obese, non-distended EXTREMITIES:  1+ pitting edema to the BLE; No deformity   ASSESSMENT  AND PLAN: .   Paroxysmal atrial fibrillation where he is noted to be in sinus rhythm with a rate of 68 on EKG today.  He is continued on apixaban 5 mg twice daily for CHA2DS2-VASc score of at least 5.  He denies any bleeding or blood in his stool or urine.  He has also been continued on Cardizem 360 mg daily and metoprolol XL 150 mg daily.  Concerns and fears today of a delay in receiving his BiPAP as it can also be a trigger for atrial fibrillation.  Advised that if he has recurrence of atrial fibrillation that we will consider workup for ablation procedure. HFimpEF with last echo in 07/2022 showing a LVEF 55-60%, no RWMA,        Dispo: Patient to return to clinic to see MD/APP in 4 months or sooner if needed.   Signed, Phallon Haydu, NP

## 2023-02-28 NOTE — Patient Instructions (Signed)
Medication Instructions:  Your physician recommends that you continue on your current medications as directed. Please refer to the Current Medication list given to you today.  *If you need a refill on your cardiac medications before your next appointment, please call your pharmacy*  Lab Work: -None ordered  Testing/Procedures: -None ordered  Follow-Up: At [facility], you and your health needs are our priority.  As part of our continuing mission to provide you with exceptional heart care, we have created designated Provider Care Teams.  These Care Teams include your primary Cardiologist (physician) and Advanced Practice Providers (APPs -  Physician Assistants and Nurse Practitioners) who all work together to provide you with the care you need, when you need it.  Your next appointment:   4 month(s)  Provider:   You may see Yvonne Kendall, MD or one of the following Advanced Practice Providers on your designated Care Team:   Nicolasa Ducking, NP Eula Listen, PA-C Cadence Fransico [name], PA-C Charlsie Quest, NP    Other Instructions -None

## 2023-03-01 ENCOUNTER — Encounter: Payer: Self-pay | Admitting: Urology

## 2023-03-01 ENCOUNTER — Other Ambulatory Visit: Payer: Self-pay | Admitting: Family

## 2023-03-01 ENCOUNTER — Ambulatory Visit: Payer: MEDICAID | Admitting: Urology

## 2023-03-01 ENCOUNTER — Telehealth: Payer: Self-pay | Admitting: Primary Care

## 2023-03-01 VITALS — BP 130/80 | HR 74 | Ht 74.0 in | Wt 364.0 lb

## 2023-03-01 DIAGNOSIS — J4530 Mild persistent asthma, uncomplicated: Secondary | ICD-10-CM

## 2023-03-01 DIAGNOSIS — R300 Dysuria: Secondary | ICD-10-CM

## 2023-03-01 DIAGNOSIS — N35812 Other urethral bulbous stricture, male: Secondary | ICD-10-CM

## 2023-03-01 LAB — URINALYSIS, COMPLETE
Bilirubin, UA: NEGATIVE
Ketones, UA: NEGATIVE
Nitrite, UA: NEGATIVE
Protein,UA: NEGATIVE
RBC, UA: NEGATIVE
Specific Gravity, UA: 1.015 (ref 1.005–1.030)
Urobilinogen, Ur: 0.2 mg/dL (ref 0.2–1.0)
pH, UA: 7 (ref 5.0–7.5)

## 2023-03-01 LAB — MICROSCOPIC EXAMINATION: Epithelial Cells (non renal): >10 /[HPF] — AB (ref 0–10)

## 2023-03-01 MED ORDER — AMOXICILLIN 875 MG PO TABS: 875.0000 mg | ORAL_TABLET | Freq: Two times a day (BID) | ORAL | 0 refills | Status: AC

## 2023-03-01 NOTE — Progress Notes (Signed)
   03/01/23  CC:  Chief Complaint  Patient presents with   Cysto    HPI: Refer to my previous note 02/01/2023.  Since his last visit he was seen in the ED for chest pain but was also complaining of dysuria and urinalysis showed >50 WBCs and urine culture positive for group B strep and discharged on cefpodoxime.  Blood pressure 130/80, pulse 74, height 6\' 2"  (1.88 m), weight (!) 364 lb (165.1 kg). NED. A&Ox3.   No respiratory distress   Abd soft, NT, ND Normal phallus with bilateral descended testicles  Cystoscopy Procedure Note  Patient identification was confirmed, informed consent was obtained, and patient was prepped using Betadine solution.  Lidocaine jelly was administered per urethral meatus.     Pre-Procedure: - Inspection reveals a normal caliber urethral meatus.  Procedure: The flexible cystoscope was introduced without difficulty - Wide caliber stricture bulbar urethra at site of previous balloon dilation.  Cystoscope easily passes - Normal prostate  - Normal bladder neck - Bilateral ureteral orifices identified - Bladder mucosa  reveals no ulcers, tumors, or lesions - No bladder stones - No trabeculation  Retroflexion shows no abnormalities   Post-Procedure: - Patient tolerated the procedure well  Assessment/ Plan: No clinically significant urethral stricture Cefpodoxime does provide strep coverage.  Will treat with an additional course of amoxicillin once he completes the current antibiotic for prostate coverage Follow-up 6 months or as needed for worsening voiding symptoms    Riki Altes, MD

## 2023-03-01 NOTE — Telephone Encounter (Signed)
Virgel Bouquet was sent to [facility] during 02/10/2023 visit.   Spoke to [employer] and was advised that Rx requires PA. Covered alternative is Symbicort 160.  Katie, please advise. Thanks

## 2023-03-01 NOTE — Telephone Encounter (Signed)
Pt states his Virgel Bouquet was never sent to [facility]. He is asking for a refill.

## 2023-03-02 MED ORDER — BUDESONIDE-FORMOTEROL FUMARATE 160-4.5 MCG/ACT IN AERO: 2.0000 | INHALATION_SPRAY | Freq: Two times a day (BID) | RESPIRATORY_TRACT | 6 refills | Status: DC

## 2023-03-02 NOTE — Telephone Encounter (Signed)
Lm x1 for the patient.

## 2023-03-02 NOTE — Telephone Encounter (Signed)
Sent Symbicort 160 mcg 2 puffs Twice daily to his pharmacy. Thanks.

## 2023-03-03 NOTE — Telephone Encounter (Signed)
Lm x2 for patient.  Will close encounter per office protocol.   

## 2023-03-06 ENCOUNTER — Emergency Department
Admission: EM | Admit: 2023-03-06 | Discharge: 2023-03-06 | Disposition: A | Discharge status: Home / Self Care | Payer: MEDICAID | Attending: Emergency Medicine | Admitting: Emergency Medicine

## 2023-03-06 ENCOUNTER — Emergency Department: Payer: MEDICAID

## 2023-03-06 ENCOUNTER — Encounter: Payer: Self-pay | Admitting: Intensive Care

## 2023-03-06 ENCOUNTER — Other Ambulatory Visit: Payer: Self-pay

## 2023-03-06 DIAGNOSIS — R1031 Right lower quadrant pain: Secondary | ICD-10-CM | POA: Diagnosis not present

## 2023-03-06 DIAGNOSIS — K921 Melena: Secondary | ICD-10-CM

## 2023-03-06 DIAGNOSIS — E119 Type 2 diabetes mellitus without complications: Secondary | ICD-10-CM | POA: Insufficient documentation

## 2023-03-06 DIAGNOSIS — R110 Nausea: Secondary | ICD-10-CM | POA: Diagnosis not present

## 2023-03-06 DIAGNOSIS — Z7901 Long term (current) use of anticoagulants: Secondary | ICD-10-CM | POA: Diagnosis not present

## 2023-03-06 DIAGNOSIS — I509 Heart failure, unspecified: Secondary | ICD-10-CM | POA: Insufficient documentation

## 2023-03-06 DIAGNOSIS — R1032 Left lower quadrant pain: Secondary | ICD-10-CM | POA: Insufficient documentation

## 2023-03-06 DIAGNOSIS — I110 Hypertensive heart disease with heart failure: Secondary | ICD-10-CM | POA: Diagnosis not present

## 2023-03-06 DIAGNOSIS — R195 Other fecal abnormalities: Secondary | ICD-10-CM | POA: Diagnosis present

## 2023-03-06 DIAGNOSIS — R1030 Lower abdominal pain, unspecified: Secondary | ICD-10-CM

## 2023-03-06 LAB — COMPREHENSIVE METABOLIC PANEL
ALT: 25 U/L (ref 0–44)
AST: 23 U/L (ref 15–41)
Albumin: 4.3 g/dL (ref 3.5–5.0)
Alkaline Phosphatase: 71 U/L (ref 38–126)
Anion gap: 12 (ref 5–15)
BUN: 20 mg/dL (ref 6–20)
CO2: 27 mmol/L (ref 22–32)
Calcium: 9.3 mg/dL (ref 8.9–10.3)
Chloride: 97 mmol/L — ABNORMAL LOW (ref 98–111)
Creatinine, Ser: 0.88 mg/dL (ref 0.61–1.24)
GFR, Estimated: >60 mL/min (ref >60)
Glucose, Bld: 109 mg/dL — ABNORMAL HIGH (ref 70–99)
Potassium: 4.2 mmol/L (ref 3.5–5.1)
Sodium: 136 mmol/L (ref 135–145)
Total Bilirubin: 0.8 mg/dL (ref 0.3–1.2)
Total Protein: 7.9 g/dL (ref 6.5–8.1)

## 2023-03-06 LAB — CBC WITH DIFFERENTIAL/PLATELET
Abs Immature Granulocytes: 0.05 10*3/uL (ref 0.00–0.07)
Basophils Absolute: 0.1 10*3/uL (ref 0.0–0.1)
Basophils Relative: 1 %
Eosinophils Absolute: 0.1 10*3/uL (ref 0.0–0.5)
Eosinophils Relative: 1 %
HCT: 42.9 % (ref 39.0–52.0)
Hemoglobin: 13.9 g/dL (ref 13.0–17.0)
Immature Granulocytes: 1 %
Lymphocytes Relative: 20 %
Lymphs Abs: 1.8 10*3/uL (ref 0.7–4.0)
MCH: 28.1 pg (ref 26.0–34.0)
MCHC: 32.4 g/dL (ref 30.0–36.0)
MCV: 86.7 fL (ref 80.0–100.0)
Monocytes Absolute: 0.8 10*3/uL (ref 0.1–1.0)
Monocytes Relative: 9 %
Neutro Abs: 6 10*3/uL (ref 1.7–7.7)
Neutrophils Relative %: 68 %
Platelets: 255 10*3/uL (ref 150–400)
RBC: 4.95 MIL/uL (ref 4.22–5.81)
RDW: 14.3 % (ref 11.5–15.5)
WBC: 8.8 10*3/uL (ref 4.0–10.5)
nRBC: 0 % (ref 0.0–0.2)

## 2023-03-06 LAB — BRAIN NATRIURETIC PEPTIDE: B Natriuretic Peptide: 58.7 pg/mL (ref 0.0–100.0)

## 2023-03-06 LAB — TROPONIN I (HIGH SENSITIVITY): Troponin I (High Sensitivity): <2 ng/L (ref <18)

## 2023-03-06 MED ORDER — LORAZEPAM 2 MG/ML IJ SOLN
INTRAMUSCULAR | Status: AC
  Filled 2023-03-06: qty 1

## 2023-03-06 MED ORDER — ONDANSETRON HCL 4 MG/2ML IJ SOLN
4.0000 mg | Freq: Once | INTRAMUSCULAR | Status: AC
  Administered 2023-03-06: 4 mg via INTRAVENOUS
  Filled 2023-03-06: qty 2

## 2023-03-06 MED ORDER — MORPHINE SULFATE (PF) 4 MG/ML IV SOLN
4.0000 mg | Freq: Once | INTRAVENOUS | Status: AC
  Administered 2023-03-06: 4 mg via INTRAVENOUS
  Filled 2023-03-06: qty 1

## 2023-03-06 MED ORDER — IOHEXOL 350 MG/ML SOLN
100.0000 mL | Freq: Once | INTRAVENOUS | Status: AC | PRN
  Administered 2023-03-06: 100 mL via INTRAVENOUS

## 2023-03-06 NOTE — ED Triage Notes (Signed)
Patient c/o blood in stool that started this AM. Takes eliquis daily. C/o pain in abdomen

## 2023-03-06 NOTE — ED Notes (Signed)
Spoke with Daniel [name] at group home stated he was coming to get the patinet,  called legal guardian baxley no answer x3

## 2023-03-06 NOTE — Discharge Instructions (Addendum)
Laboratory workup and imaging was reassuring today with no concerning findings.  Please follow-up as planned tomorrow with your primary care provider.  They can help measure your Depakote levels as well as get you outpatient neurology follow-up.

## 2023-03-06 NOTE — ED Provider Notes (Signed)
[facility] Provider Note    Event Date/Time   First MD Initiated Contact with Patient 03/06/23 1641     (approximate)   History   Blood In Stools   HPI Daniel [name] is a 32 y.o. male with CHF, cardiomyopathy, HTN, HLD, A-fib on Eliquis, DM2 presenting today for rectal bleeding.  Patient noted bright red blood both in his stools and when wiping.  No pain with bowel movements.  No recent history of this.  Has felt lightheaded today.  Also noted severe abdominal pain in bilateral lower quadrants and suprapubic region.  Has had nausea without vomiting.  No fevers or chills.  No melanotic stools.     Physical Exam   Triage Vital Signs: ED Triage Vitals  Encounter Vitals Group     BP 03/06/23 1359 (!) 109/96     Systolic BP Percentile --      Diastolic BP Percentile --      Pulse Rate 03/06/23 1356 74     Resp 03/06/23 1356 18     Temp 03/06/23 1356 97.8 F (36.6 C)     Temp Source 03/06/23 1356 Oral     SpO2 03/06/23 1356 94 %     Weight 03/06/23 1356 (!) 363 lb (164.7 kg)     Height 03/06/23 1356 6\' 4"  (1.93 m)     Head Circumference --      Peak Flow --      Pain Score 03/06/23 1356 10     Pain Loc --      Pain Education --      Exclude from Growth Chart --     Most recent vital signs: Vitals:   03/06/23 1645 03/06/23 1700  BP: 125/72 (!) 116/59  Pulse: 73 79  Resp:    Temp:    SpO2: 96% 90%   Physical Exam: I have reviewed the vital signs and nursing notes. General: Awake, alert, no acute distress.  Nontoxic appearing. Head:  Atraumatic, normocephalic.   ENT:  EOM intact, PERRL. Oral mucosa is pink and moist with no lesions. Neck: Neck is supple with full range of motion, No meningeal signs. Cardiovascular:  RRR, No murmurs. Peripheral pulses palpable and equal bilaterally. Respiratory:  Symmetrical chest wall expansion.  No rhonchi, rales, or wheezes.  Good air movement throughout.  No use of accessory muscles.    Musculoskeletal:  No cyanosis or edema. Moving extremities with full ROM Abdomen:  Soft, tenderness palpation bilateral lower quadrants, nondistended. Neuro:  GCS 15, moving all four extremities, interacting appropriately. Speech clear. Psych:  Calm, appropriate.   Skin:  Warm, dry, no rash.    ED Results / Procedures / Treatments   Labs (all labs ordered are listed, but only abnormal results are displayed) Labs Reviewed  COMPREHENSIVE METABOLIC PANEL - Abnormal; Notable for the following components:      Result Value   Chloride 97 (*)    Glucose, Bld 109 (*)    All other components within normal limits  CBC WITH DIFFERENTIAL/PLATELET  BRAIN NATRIURETIC PEPTIDE  VALPROIC ACID LEVEL  TROPONIN I (HIGH SENSITIVITY)     EKG    RADIOLOGY Independently interpreted CT imaging of abdomen as well as chest x-ray with no acute pathology.   PROCEDURES:  Critical Care performed: No  Procedures   MEDICATIONS ORDERED IN ED: Medications  LORazepam (ATIVAN) 2 MG/ML injection (has no administration in time range)  morphine (PF) 4 MG/ML injection 4 mg (4 mg Intravenous Given 03/06/23 1722)  ondansetron (ZOFRAN) injection 4 mg (4 mg Intravenous Given 03/06/23 1722)  iohexol (OMNIPAQUE) 350 MG/ML injection 100 mL (100 mLs Intravenous Contrast Given 03/06/23 1741)     IMPRESSION / MDM / ASSESSMENT AND PLAN / ED COURSE  I reviewed the triage vital signs and the nursing notes.                              Differential diagnosis includes, but is not limited to, internal hemorrhoid, external hemorrhoid, diverticular bleed, gastritis versus colitis.  Patient's presentation is most consistent with acute complicated illness / injury requiring diagnostic workup.  Patient is a 32 year old male presenting today for bright red blood in stool with some lower abdominal pain.  Vital signs are stable and CBC shows no significant anemia drop.  Given that he is on blood thinners, will get CT GI  protocol.  This was negative for any evidence of acute bleed and otherwise no concerning intra-abdominal pathology.  Laboratory workup otherwise unremarkable and reassuring today with no emergent findings.  While in the bed, patient did have 1 episode of seizure-like activity lasting approximately 30 seconds.  Once this is stated he was immediately responsive and able to talk with me.  Do not suspect this is actual seizure activity.  He does have prior seizure-like episodes and is on medication outpatient.  Do not think he needs to stay here for further evaluation now that he is back to his baseline.  He has follow-up with his PCP tomorrow which I instructed him to attend.  Otherwise given strict return precautions for worsening symptoms.  The patient is on the cardiac monitor to evaluate for evidence of arrhythmia and/or significant heart rate changes. Clinical Course as of 03/06/23 2027  Mon Mar 06, 2023  1704 CBC with Differential Hemoglobin stable [DW]  1819 CT ANGIO GI BLEED No evidence of GI blood or other acute intra-abdominal pathology [DW]  1921 DG Chest Portable 1 View No acute pathology. [DW]  1951 Troponin I (High Sensitivity): <2 [DW]  1951 B Natriuretic Peptide: 58.7 [DW]  2013 Patient reassessed.  Asymptomatic and asking to be discharged at this time [DW]    Clinical Course User Index [DW] Janith Lima, MD     FINAL CLINICAL IMPRESSION(S) / ED DIAGNOSES   Final diagnoses:  Blood in stool  Lower abdominal pain     Rx / DC Orders   ED Discharge Orders     None        Note:  This document was prepared using Dragon voice recognition software and may include unintentional dictation errors.   Janith Lima, MD 03/06/23 2028

## 2023-03-07 ENCOUNTER — Encounter: Payer: Self-pay | Admitting: Family

## 2023-03-07 ENCOUNTER — Ambulatory Visit: Payer: MEDICAID | Attending: Family | Admitting: Family

## 2023-03-07 VITALS — BP 120/73 | HR 77 | Wt 365.0 lb

## 2023-03-07 DIAGNOSIS — E119 Type 2 diabetes mellitus without complications: Secondary | ICD-10-CM | POA: Diagnosis not present

## 2023-03-07 DIAGNOSIS — E669 Obesity, unspecified: Secondary | ICD-10-CM | POA: Diagnosis not present

## 2023-03-07 DIAGNOSIS — I5030 Unspecified diastolic (congestive) heart failure: Secondary | ICD-10-CM | POA: Diagnosis not present

## 2023-03-07 DIAGNOSIS — F259 Schizoaffective disorder, unspecified: Secondary | ICD-10-CM | POA: Diagnosis not present

## 2023-03-07 DIAGNOSIS — I10 Essential (primary) hypertension: Secondary | ICD-10-CM | POA: Diagnosis not present

## 2023-03-07 DIAGNOSIS — I110 Hypertensive heart disease with heart failure: Secondary | ICD-10-CM | POA: Insufficient documentation

## 2023-03-07 DIAGNOSIS — I428 Other cardiomyopathies: Secondary | ICD-10-CM | POA: Diagnosis present

## 2023-03-07 DIAGNOSIS — Z7984 Long term (current) use of oral hypoglycemic drugs: Secondary | ICD-10-CM | POA: Insufficient documentation

## 2023-03-07 DIAGNOSIS — Z7901 Long term (current) use of anticoagulants: Secondary | ICD-10-CM | POA: Diagnosis not present

## 2023-03-07 DIAGNOSIS — Z8673 Personal history of transient ischemic attack (TIA), and cerebral infarction without residual deficits: Secondary | ICD-10-CM | POA: Insufficient documentation

## 2023-03-07 DIAGNOSIS — I480 Paroxysmal atrial fibrillation: Secondary | ICD-10-CM | POA: Diagnosis not present

## 2023-03-07 DIAGNOSIS — Z79899 Other long term (current) drug therapy: Secondary | ICD-10-CM | POA: Insufficient documentation

## 2023-03-07 DIAGNOSIS — R0602 Shortness of breath: Secondary | ICD-10-CM | POA: Insufficient documentation

## 2023-03-07 DIAGNOSIS — R059 Cough, unspecified: Secondary | ICD-10-CM | POA: Diagnosis not present

## 2023-03-07 DIAGNOSIS — E785 Hyperlipidemia, unspecified: Secondary | ICD-10-CM | POA: Diagnosis not present

## 2023-03-07 DIAGNOSIS — I5032 Chronic diastolic (congestive) heart failure: Secondary | ICD-10-CM

## 2023-03-07 DIAGNOSIS — Z7985 Long-term (current) use of injectable non-insulin antidiabetic drugs: Secondary | ICD-10-CM | POA: Diagnosis not present

## 2023-03-07 DIAGNOSIS — Z87891 Personal history of nicotine dependence: Secondary | ICD-10-CM | POA: Insufficient documentation

## 2023-03-07 DIAGNOSIS — G4730 Sleep apnea, unspecified: Secondary | ICD-10-CM

## 2023-03-07 DIAGNOSIS — G4733 Obstructive sleep apnea (adult) (pediatric): Secondary | ICD-10-CM | POA: Diagnosis not present

## 2023-03-07 MED ORDER — FUROSEMIDE 20 MG PO TABS: 10.0000 mg | ORAL_TABLET | Freq: Every day | ORAL | 1 refills | Status: DC

## 2023-03-07 MED ORDER — TRULICITY 0.75 MG/0.5ML ~~LOC~~ SOAJ: 0.7500 mg | SUBCUTANEOUS | 0 refills | Status: DC

## 2023-03-07 NOTE — Progress Notes (Signed)
Patient ID: Daniel [name], male    DOB: 10-26-1990, 32 y.o.   MRN: [id number]  PCP: Anselm Jungling, NP (at group home) Primary Cardiologist: Yvonne Kendall, MD (last seen 10/24) HF provider: Marca Ancona, MD (last seen 03/24)  HPI:  [name] is a 32 y/o male with a history of DM, hyperlipidemia, HTN, paroxysmal atrial fibrillation, conversion disorder, sleep apnea, schizoaffective disorder, seizure, TIA, urethral stricture, tobacco use and chronic heart failure. Patient has had a long history of paroxysmal AF.  He says this was diagnosed when he was 20 and he was told that it was triggered by excessive use of energy drinks.  He never abused drugs or ETOH per his report though he was a smoker until 5/23.  He was followed in [place] in the past. There was talk of atrial fibrillation ablation but it was never undertaken. Subsequently, he moved to a group home in [place].    Patient has had home oxygen since 2022 per his report.  High resolution CT chest in 2/24 showed no ILD, possible small airways disease.  PFTs in 2/24 showed minimal obstructive airways disease.  Sleep study showed severe OSA.  Was in the ED 01/14/23 due to increasing swelling in both of his legs along with some difficulty breathing over the past 2 days. EKG shows no evidence of arrhythmia or ischemia and troponin within normal limits, chest x-ray negative for acute process.  No significant anemia, leukocytosis, tract abnormality, or AKI noted. Urinalysis with no signs of infection. IV lasix given. Was in the ED 01/23/23 due to shortness of breath and chest pain that started earlier today. Also complains of swelling in bilateral legs. EKG chest x-ray labs and vital signs are normal. IV lasix provided with higher outpatient lasix dose for a few days. Was in the ED 02/02/23 due to acute left-sided weakness. Patient reports he was awake at that time and around 1:40 AM developed sudden nausea, pulsating in his head and a pounding  headache with diffuse blurry vision throughout his visual fields. CT and angiogram are reassuring, no LVO. Subsequent MRI brain without evidence of acute pathology or acute strokes. With management of his migraine with Fioricet, Benadryl and Compazine, his symptoms resolved and neurologically back to baseline. Normal metabolic panel and CBC. Had 2 ED visits 09/24 for atypical chest pain and cystitis. ED visit 03/06/23 due to blood in the stool and severe abdominal pain. Abdominal CT negative.    Echo 08/04/21: EF of 50% along with mild LVH Echo 10/22/21: EF of 45-50%. Echo 08/22/22: EF 55-60%.    RHC 10/07/22: Hemodynamics (mmHg) RA mean 2 RV 20/2 PA 23/8, mean 15 PCWP mean 3  Oxygen saturations: PA 74% AO 96%  Cardiac Output (Fick) 9.71  Cardiac Index (Fick) 3.34   He presents today for a HF f/u visit with a chief complaint of moderate fatigue with minimal exertion. Chronic in nature. Has shortness of breath, cough, pedal edema and less wheezing along with this. Denies chest pain, palpitations, abdominal distention, dizziness or difficulty sleeping. He still has not gotten his bipap machine but says that the signed order has been sent to [facility]. Has been walking/ running 1 mile a few days/ week & lifting weights.    Continues to live at an alternative living facility in [place] and is more pleased. He says that he's eating better at new facility, getting outside more and hoping to get trained for employment. Receiving psychotherapy and behavioral therapy & says that is making a big  difference.   At last visit, he was told to increase his diuretic for 3 days to 40mg  and then decrease it back to 20mg  daily. Spironolactone 25mg  daily was also started and he feels much better since adding the spironolactone.   Did have a recent ED visit due to blood in his stool that is being attributed to possible hemorrhoids. He says that his dad died of colon cancer at the age of 47 so is going to talk with his  PCP about getting a colonoscopy done.   ROS: All systems negative except as listed in HPI, PMH and Problem List.  SH:  Social History   Socioeconomic History   Marital status: Single    Spouse name: Not on file   Number of children: Not on file   Years of education: Not on file   Highest education level: Not on file  Occupational History   Not on file  Tobacco Use   Smoking status: Former    Current packs/day: 0.00    Types: Cigarettes    Start date: 08/2019    Quit date: 08/2021    Years since quitting: 1.5   Smokeless tobacco: Never  Vaping Use   Vaping status: Never Used  Substance and Sexual Activity   Alcohol use: Not Currently   Drug use: Never   Sexual activity: Not on file  Other Topics Concern   Not on file  Social History Narrative   Now living in a group home in [place].   Social Determinants of Health   Financial Resource Strain: Low Risk  (03/16/2021)   Received from [employer], [facility]   Overall Financial Resource Strain (CARDIA)    Difficulty of Paying Living Expenses: Not very hard  Food Insecurity: No Food Insecurity (09/01/2020)   Received from [facility], [facility]   Hunger Vital Sign    Worried About Running Out of Food in the Last Year: Never true    Ran Out of Food in the Last Year: Never true  Transportation Needs: Not on file  Physical Activity: Not on file  Stress: No Stress Concern Present (03/16/2021)   Received from [facility], [facility]   [employer] of Occupational Health - Occupational Stress Questionnaire    Feeling of Stress : Not at all  Social Connections: Unknown (09/27/2021)   Received from [facility], [facility]   Social Network    Social Network: Not on file  Intimate Partner Violence: Unknown (09/01/2021)   Received from [facility], [facility]   HITS    Physically Hurt: Not on file    Insult or Talk Down To: Not on file    Threaten Physical Harm: Not on file    Scream or Curse: Not  on file    FH: No family history on file.  Past Medical History:  Diagnosis Date   Cardiomyopathy [facility])    a.) TTE 08/04/2021: EF 50%; b.) TTE 10/22/2021: EF 45-50-%; c.) TTE 08/22/2022: EF 55-60%   Chest pain    CHF (congestive heart failure) (HCC)    a.) TTE 08/04/2021: EF 50%, mild LVH, RVSF norm; b.) TTE 10/22/2021: EF 45-50%, glob HK, RVSF norm; c.) TTE 08/22/2022: EF 55-60%, mild dil LV, RVSF norm.   Conversion disorder    Current use of long term anticoagulation    Dyspnea    GERD (gastroesophageal reflux disease)    Hepatic steatosis    Hyperlipidemia    Hypertension    Insomnia    a.)  melatonin PRN   Long term current use of anticoagulant    a.) apixaban   OSA on CPAP    Persistent atrial fibrillation and flutter (HCC)    a.) CHA2DS2VASc = 5 (CHF, HTN, TIA x 2, T2DM);  b.) rate/rhythm maintained on oral diltiazem + metoprolol succinate; chronically anticoagulated with apixaban   Schizoaffective disorder (HCC)    Seizure disorder (HCC)    T2DM (type 2 diabetes mellitus) (HCC)    TIA (transient ischemic attack)    Urethral stricture    a. 08/2021 s/p cystoscopy and urethral dilation.    Current Outpatient Medications  Medication Sig Dispense Refill   acetaminophen (TYLENOL) 325 MG tablet Take 2 tablets (650 mg total) by mouth every 4 (four) hours as needed. 100 tablet 2   albuterol (VENTOLIN HFA) 108 (90 Base) MCG/ACT inhaler Inhale 2 puffs into the lungs every 6 (six) hours as needed for wheezing. 1 each 2   allopurinol (ZYLOPRIM) 300 MG tablet Take 1 tablet (300 mg total) by mouth daily. 30 tablet 0   amoxicillin (AMOXIL) 875 MG tablet Take 1 tablet (875 mg total) by mouth every 12 (twelve) hours for 14 days. 28 tablet 0   atorvastatin (LIPITOR) 80 MG tablet TAKE 1  TABLET BY MOUTH ONCE DAILY 24 tablet 10   baclofen (LIORESAL) 10 MG tablet Take 1 tablet (10 mg total) by mouth 2 (two) times daily as needed for muscle spasms. Home med. (Patient taking differently: Take  10 mg by mouth 2 (two) times daily. Home med.) 30 each 0   benztropine (COGENTIN) 1 MG tablet Take 1 mg by mouth daily.     budesonide-formoterol (SYMBICORT) 160-4.5 MCG/ACT inhaler Inhale 2 puffs into the lungs 2 (two) times daily. 1 each 6   cyclobenzaprine (FLEXERIL) 5 MG tablet Take 5 mg by mouth 3 (three) times daily as needed for muscle spasms.     dapagliflozin propanediol (FARXIGA) 10 MG TABS tablet Take 1 tablet (10 mg total) by mouth daily before breakfast. 30 tablet 5   diltiazem (CARDIZEM CD) 360 MG 24 hr capsule Take 1 capsule (360 mg total) by mouth daily. 30 capsule 5   diltiazem (CARDIZEM CD) 360 MG 24 hr capsule Take 1 capsule (360 mg total) by mouth daily. 30 capsule 0   divalproex (DEPAKOTE) 500 MG DR tablet Take 2 tablets (1,000 mg total) by mouth 2 (two) times daily. (Patient taking differently: Take 1,000 mg by mouth daily at 6 (six) AM.)     ELIQUIS 5 MG TABS tablet TAKE 1 TABLET BY MOUTH TWICE DAILY 49 tablet 10   escitalopram (LEXAPRO) 10 MG tablet Take 20 mg by mouth daily.     fluticasone (FLONASE) 50 MCG/ACT nasal spray Place 2 sprays into both nostrils 2 (two) times daily.     furosemide (LASIX) 20 MG tablet Take 1 tablet (20 mg total) by mouth daily. 30 tablet 5   hydrOXYzine (ATARAX) 25 MG tablet Take 25 mg by mouth daily. At noon     hydrOXYzine (ATARAX) 50 MG tablet Take 50 mg by mouth in the morning and at bedtime.     INVEGA 9 MG 24 hr tablet Take 9 mg by mouth every morning.     lithium carbonate (LITHOBID) 300 MG CR tablet Take 600 mg by mouth every evening.     loratadine (CLARITIN) 10 MG tablet Take 10 mg by mouth daily.     melatonin 3 MG TABS tablet Take 3 mg by mouth at bedtime.  metoprolol succinate (TOPROL-XL) 100 MG 24 hr tablet TAKE 1 AND 1/2 TABLET (150MG ) BY MOUTH ONCE DAILY  WITH OR IMMEDIATELY FOLLOWING A MEAL. 42 tablet 9   omeprazole (PRILOSEC) 20 MG capsule Take 20 mg by mouth daily.     ondansetron (ZOFRAN-ODT) 4 MG disintegrating tablet  Take 1 tablet (4 mg total) by mouth every 8 (eight) hours as needed. 20 tablet 0   propranolol (INDERAL) 10 MG tablet Take 10 mg by mouth 2 (two) times daily.     sacubitril-valsartan (ENTRESTO) 24-26 MG TAKE 1 TABLET BY MOUTH TWICE DAILY 60 tablet 11   spironolactone (ALDACTONE) 25 MG tablet Take 1 tablet (25 mg total) by mouth daily. 90 tablet 3   tamsulosin (FLOMAX) 0.4 MG CAPS capsule Take 1 capsule (0.4 mg total) by mouth daily. 30 capsule 2   No current facility-administered medications for this visit.   Vitals:   03/07/23 1439  BP: 120/73  Pulse: 77  SpO2: 96%  Weight: (!) 365 lb (165.6 kg)   Wt Readings from Last 3 Encounters:  03/07/23 (!) 365 lb (165.6 kg)  03/06/23 (!) 363 lb (164.7 kg)  03/01/23 (!) 364 lb (165.1 kg)   Lab Results  Component Value Date   CREATININE 0.88 03/06/2023   CREATININE 1.02 02/24/2023   CREATININE 1.05 02/10/2023   PHYSICAL EXAM:  General:  Well appearing. No resp difficulty HEENT: normal Neck: supple. JVP flat. No lymphadenopathy or thryomegaly appreciated. Cor: PMI normal. Regular rate & rhythm. No rubs, gallops or murmurs. Lungs: clear Abdomen: soft, nontender, nondistended. No hepatosplenomegaly. No bruits or masses.  Extremities: no cyanosis, clubbing, rash, trace pitting edema right lower leg/ 1+ pitting left lower leg Neuro: alert & orientedx3, cranial nerves grossly intact. Moves all 4 extremities w/o difficulty. Affect pleasant.   ECG: not done  ReDs: 38% (improved from previous visit)   ASSESSMENT & PLAN:  1: NICM with preserved ejection fraction- - suspect due to AF/ OSA - NYHA class III - euvolemic - weighing daily; reminded to call for an overnight weight gain of > 2 pounds or a weekly weight gain of > 5 pounds - weight down 7 pounds from last visit here 1 month ago - ReDs 38% (previous reading was 46%) - Echo 08/04/21: EF of 50% along with mild LVH - Echo 10/22/21: EF of 45-50%. - Echo 08/22/22: EF 55-60%.   - RHC  10/07/22:   Hemodynamics (mmHg)   RA mean 2   RV 20/2   PA 23/8, mean 15   PCWP mean 3   Oxygen saturations:   PA 74%   AO 96%   Cardiac Output (Fick) 9.71    Cardiac Index (Fick) 3.34  - he says that he's eating better at the new facility that he's living at - not adding salt to his food - emphasized keeping daily fluid intake to 60 ounces daily  - continue metoprolol succinate 150mg  daily - continue farxiga 10mg  daily - continue entresto 24/26mg  BID - decrease furosemide to 10mg  daily with additional 10mg  if needed for weight gain, edema - continue spironolactone 25mg  daily - just had BMET yesterday while in the ER - saw HF provider Shirlee Latch) 03/24 - continue exercise (walking/ lifting weights) - BNP 03/06/23 was 58.7  2: HTN- - BP 120/73 - sees PCP at the group home  - BMP 03/06/23 showed sodium 136, potassium 4.2, creatinine 0.88 and GFR >60  3: Paroxysmal atrial fibrillation- - per his report, onset was around age 59 - saw  cardiology Cathe Mons) 10/24 - continue apixaban 5mg  BID - would pursue atrial fibrillation ablation if he has recurrence of atrial fibrillation once he is on bipap   4: Obesity- - A1c 08/05/22 was 5.8% - had medical nutrition visit 11/23/22; advised to call them to get f/u appt scheduled - will begin trulicity 0.75mg  weekly; should he develop increase in anxiety or any suicidal thoughts, it is to be stopped immediately - reviewed the importance of decreasing fried/fatty foods and portion sizes to limit nausea/ bloating  5: Sleep apnea- - saw pulmonology Allison Quarry) 09/24 - Split night sleep study 07/07/22 showed severe OSA, AHI 143/hour with severe oxygen desaturations to 63%. Corrected with BIPAP 20/20cm h20 with large full facemask - still waiting on bipap although he says that the signed order has been sent to [facility] - waiting on oxygen (through [facility]) to get started at his new facility  6: DM- - A1c 08/05/22 was 5.8% - starting trulicity per  above  Return in 1 month, sooner if needed.

## 2023-03-07 NOTE — Progress Notes (Signed)
ReDS Vest / Clip - 03/07/23 1439       ReDS Vest / Clip   Station Marker D    Ruler Value 36    ReDS Value Range Moderate volume overload    ReDS Actual Value 38

## 2023-03-07 NOTE — Patient Instructions (Addendum)
DECREASE YOUR LASIX TO 10 MG ONCE DAILY. (1/2 TABLETS DAILY). TAKE AN ADDITIONAL 10 MG AS NEED FOR WEIGHT GAIN AND/OR SWELLING  START TRULICITY 0.75 MG SUBCUTANEOUSLY ONCE WEEKLY

## 2023-03-15 ENCOUNTER — Other Ambulatory Visit: Payer: Self-pay | Admitting: Adult Health

## 2023-03-15 DIAGNOSIS — N63 Unspecified lump in unspecified breast: Secondary | ICD-10-CM

## 2023-03-26 ENCOUNTER — Other Ambulatory Visit: Payer: Self-pay | Admitting: Family

## 2023-04-04 ENCOUNTER — Emergency Department: Payer: MEDICAID

## 2023-04-04 ENCOUNTER — Emergency Department
Admission: EM | Admit: 2023-04-04 | Discharge: 2023-04-05 | Disposition: A | Discharge status: Home / Self Care | Payer: MEDICAID | Attending: Emergency Medicine | Admitting: Emergency Medicine

## 2023-04-04 ENCOUNTER — Other Ambulatory Visit: Payer: Self-pay

## 2023-04-04 DIAGNOSIS — R109 Unspecified abdominal pain: Secondary | ICD-10-CM | POA: Insufficient documentation

## 2023-04-04 DIAGNOSIS — I509 Heart failure, unspecified: Secondary | ICD-10-CM | POA: Insufficient documentation

## 2023-04-04 DIAGNOSIS — I110 Hypertensive heart disease with heart failure: Secondary | ICD-10-CM | POA: Diagnosis not present

## 2023-04-04 DIAGNOSIS — R55 Syncope and collapse: Secondary | ICD-10-CM | POA: Insufficient documentation

## 2023-04-04 DIAGNOSIS — Z7901 Long term (current) use of anticoagulants: Secondary | ICD-10-CM | POA: Diagnosis not present

## 2023-04-04 DIAGNOSIS — Z1152 Encounter for screening for COVID-19: Secondary | ICD-10-CM | POA: Diagnosis not present

## 2023-04-04 DIAGNOSIS — M542 Cervicalgia: Secondary | ICD-10-CM | POA: Diagnosis not present

## 2023-04-04 LAB — COMPREHENSIVE METABOLIC PANEL
ALT: 25 U/L (ref 0–44)
AST: 21 U/L (ref 15–41)
Albumin: 4.5 g/dL (ref 3.5–5.0)
Alkaline Phosphatase: 77 U/L (ref 38–126)
Anion gap: 10 (ref 5–15)
BUN: 25 mg/dL — ABNORMAL HIGH (ref 6–20)
CO2: 30 mmol/L (ref 22–32)
Calcium: 9.3 mg/dL (ref 8.9–10.3)
Chloride: 96 mmol/L — ABNORMAL LOW (ref 98–111)
Creatinine, Ser: 0.97 mg/dL (ref 0.61–1.24)
GFR, Estimated: >60 mL/min (ref >60)
Glucose, Bld: 94 mg/dL (ref 70–99)
Potassium: 4.1 mmol/L (ref 3.5–5.1)
Sodium: 136 mmol/L (ref 135–145)
Total Bilirubin: 0.6 mg/dL (ref <1.2)
Total Protein: 8 g/dL (ref 6.5–8.1)

## 2023-04-04 LAB — URINALYSIS, ROUTINE W REFLEX MICROSCOPIC
Bacteria, UA: NONE SEEN
Bilirubin Urine: NEGATIVE
Glucose, UA: >=500 mg/dL — AB
Hgb urine dipstick: NEGATIVE
Ketones, ur: NEGATIVE mg/dL
Leukocytes,Ua: NEGATIVE
Nitrite: NEGATIVE
Protein, ur: NEGATIVE mg/dL
Specific Gravity, Urine: 1.016 (ref 1.005–1.030)
pH: 6 (ref 5.0–8.0)

## 2023-04-04 LAB — RESP PANEL BY RT-PCR (RSV, FLU A&B, COVID)  RVPGX2
Influenza A by PCR: NEGATIVE
Influenza B by PCR: NEGATIVE
Resp Syncytial Virus by PCR: NEGATIVE
SARS Coronavirus 2 by RT PCR: NEGATIVE

## 2023-04-04 LAB — CBC
HCT: 42.8 % (ref 39.0–52.0)
Hemoglobin: 13.9 g/dL (ref 13.0–17.0)
MCH: 28.4 pg (ref 26.0–34.0)
MCHC: 32.5 g/dL (ref 30.0–36.0)
MCV: 87.3 fL (ref 80.0–100.0)
Platelets: 233 10*3/uL (ref 150–400)
RBC: 4.9 MIL/uL (ref 4.22–5.81)
RDW: 13.8 % (ref 11.5–15.5)
WBC: 8.2 10*3/uL (ref 4.0–10.5)
nRBC: 0.2 % (ref 0.0–0.2)

## 2023-04-04 LAB — TROPONIN I (HIGH SENSITIVITY)
Troponin I (High Sensitivity): 2 ng/L (ref <18)
Troponin I (High Sensitivity): 3 ng/L (ref <18)

## 2023-04-04 LAB — LITHIUM LEVEL: Lithium Lvl: 0.21 mmol/L — ABNORMAL LOW (ref 0.60–1.20)

## 2023-04-04 LAB — LIPASE, BLOOD: Lipase: 29 U/L (ref 11–51)

## 2023-04-04 LAB — BRAIN NATRIURETIC PEPTIDE: B Natriuretic Peptide: 49.6 pg/mL (ref 0.0–100.0)

## 2023-04-04 MED ORDER — IOHEXOL 350 MG/ML SOLN
100.0000 mL | Freq: Once | INTRAVENOUS | Status: AC | PRN
  Administered 2023-04-04: 100 mL via INTRAVENOUS

## 2023-04-04 NOTE — Discharge Instructions (Addendum)
Please call your cardiologist to make a follow-up appointment and return to the ER if you develop worsening symptoms of recurrent syncope or any other concerns.

## 2023-04-04 NOTE — ED Notes (Signed)
Pt with [place] technician in available room to perform US Abdomen Limited RUQ.

## 2023-04-04 NOTE — ED Triage Notes (Signed)
Per EMS:  Syncope episode Pt reports his knee gave out Abdomen pain Radiates to back Started yesterday Nausea Difficulty urination  Odor-"fish smell"

## 2023-04-04 NOTE — ED Provider Notes (Addendum)
[facility] Provider Note    Event Date/Time   First MD Initiated Contact with Patient 04/04/23 1926     (approximate)   History   Abdominal Pain, Fall, Near Syncope, and Nausea   HPI  Daniel [name] is a 32 y.o. male history of A-fib on Eliquis, cardiomyopathy, CHF, hypertension, hyperlipidemia who comes in with concerns for syncope.  Patient reports that he has been having some right-sided abdominal pain.  He reports having a small bowel movement today and then when he was trying to get out of the bathroom he stood up and his left leg gave out on him and he then landed on the ground.  He thinks he did have LOC.  He reports chronic issues with osteoarthritis and bone-on-bone bone.  He states that he is not sure if he hit his head but he is on Eliquis and he does report some neck discomfort.  He also reports some right sided abdominal pain.  He reports still having his gallbladder.  He states that he feels like he has had a little bit of increased fluid buildup in his legs as well.  Reports being compliant with his Eliquis.  He also does take 10 mg of Lasix and spironolactone. Physical Exam   Triage Vital Signs: ED Triage Vitals  Encounter Vitals Group     BP 04/04/23 1732 (!) 116/56     Systolic BP Percentile --      Diastolic BP Percentile --      Pulse Rate 04/04/23 1732 87     Resp 04/04/23 1732 18     Temp 04/04/23 1732 98.4 F (36.9 C)     Temp Source 04/04/23 1732 Oral     SpO2 04/04/23 1727 95 %     Weight 04/04/23 1728 (!) 351 lb (159.2 kg)     Height 04/04/23 1728 6\' 4"  (1.93 m)     Head Circumference --      Peak Flow --      Pain Score 04/04/23 1728 10     Pain Loc --      Pain Education --      Exclude from Growth Chart --     Most recent vital signs: Vitals:   04/04/23 1727 04/04/23 1732  BP:  (!) 116/56  Pulse:  87  Resp:  18  Temp:  98.4 F (36.9 C)  SpO2: 95% 96%     General: Awake, no distress.  CV:  Good peripheral  perfusion.  Resp:  Normal effort.  Abd:  No distention.  Tenderness on the right side of his abdomen Other:  Some trace edema noted in bilateral legs Mild C-spine tenderness  ED Results / Procedures / Treatments   Labs (all labs ordered are listed, but only abnormal results are displayed) Labs Reviewed  COMPREHENSIVE METABOLIC PANEL - Abnormal; Notable for the following components:      Result Value   Chloride 96 (*)    BUN 25 (*)    All other components within normal limits  RESP PANEL BY RT-PCR (RSV, FLU A&B, COVID)  RVPGX2  LIPASE, BLOOD  CBC  URINALYSIS, ROUTINE W REFLEX MICROSCOPIC  BRAIN NATRIURETIC PEPTIDE  LITHIUM LEVEL  TROPONIN I (HIGH SENSITIVITY)     EKG  My interpretation of EKG:  Normal sinus rate of 80 without any ST elevation, T wave version lead III, QTc 484.  Similar T wave version on 10/7  RADIOLOGY I have reviewed the CT head personally interpreted and  no evidence of intracranial hemorrhage   PROCEDURES:  Critical Care performed: No  Procedures   MEDICATIONS ORDERED IN ED: Medications  iohexol (OMNIPAQUE) 350 MG/ML injection 100 mL (100 mLs Intravenous Contrast Given 04/04/23 2135)     IMPRESSION / MDM / ASSESSMENT AND PLAN / ED COURSE  I reviewed the triage vital signs and the nursing notes.   Patient's presentation is most consistent with acute presentation with potential threat to life or bodily function.   Will get labs to evaluate for CHF, CT head evaluate for intracranial hemorrhage CT cervical evaluate for cervical fracture and CT abdomen to evaluate for any acute pathology given continued abdominal discomfort.  Given syncopal was right after standing up this could have been vasovagal versus dehydration versus related to his knee giving out and then he hit his head and does not remember what happened.  Will he had an echo in March 2024 that showed normal EF and inability to evaluate the diastolic dysfunction but I do not feel like he  has significantly lower EF to suggest V-fib or V. tach.  I will get EKG however and cardiac markers and continue to monitor him on the cardiac monitor.  CBC is reassuring with normal white count, CMP reassuring.  Lipase is normal.  Lithium is 0.21.  Urine without evidence of UTI  CT concerning for possible cystitis but UA is reassuring so does not seem to be UTI.  The rest of his CT scans were all reassuring.  I reviewed the note from 10/8 where patient's blood pressures are in the 120s.  Discussed with patient reassuring workup thus far.  When I asked him again he states that he felt like his knee gave out and that is what caused him to fall out that not that he blacked out first.  He reports chronic pain with his knee where he gets injections and physical therapy for osteoarthritis.  Patient has been up and ambulatory to the bathroom without recurrent symptoms.  He denies any warmth or redness on the he is got compression socks currently on with good distal pulse.  Patient feels comfortable with discharge home and will follow-up with his cardiologist.  Attempted to call [facility] and unable to get ahold of them.   The patient is on the cardiac monitor to evaluate for evidence of arrhythmia and/or significant heart rate changes.      FINAL CLINICAL IMPRESSION(S) / ED DIAGNOSES   Final diagnoses:  Syncope, unspecified syncope type     Rx / DC Orders   ED Discharge Orders     None        Note:  This document was prepared using Dragon voice recognition software and may include unintentional dictation errors.   Concha Se, MD 04/04/23 2252    Concha Se, MD 04/04/23 1191    Concha Se, MD 04/04/23 2348    Concha Se, MD 04/04/23 2351

## 2023-04-04 NOTE — ED Notes (Signed)
Called and spoke with legal guardian [name] and gave update, all questions answered. Spoke with Kathlene November regarding transportation for pt back home, states he will be here in approx 30 min.

## 2023-04-06 ENCOUNTER — Ambulatory Visit
Admission: RE | Admit: 2023-04-06 | Discharge: 2023-04-06 | Disposition: A | Discharge status: Home / Self Care | Payer: MEDICAID | Source: Ambulatory Visit | Attending: Adult Health | Admitting: Adult Health

## 2023-04-06 ENCOUNTER — Other Ambulatory Visit (HOSPITAL_COMMUNITY): Payer: Self-pay

## 2023-04-06 ENCOUNTER — Telehealth (HOSPITAL_COMMUNITY): Payer: Self-pay

## 2023-04-06 DIAGNOSIS — N63 Unspecified lump in unspecified breast: Secondary | ICD-10-CM | POA: Diagnosis present

## 2023-04-06 NOTE — Telephone Encounter (Signed)
Advanced Heart Failure Patient Advocate Encounter  Prior authorization for Marcelline Deist has been submitted and approved. Test billing returns $4 for 90 day supply.  Key: [id number] Effective: 04/05/2023 to 04/03/2024  Burnell Blanks, CPhT Rx Patient Advocate Phone: [phone]

## 2023-04-10 NOTE — Progress Notes (Unsigned)
Patient ID: Daniel [name], male    DOB: [birth year], 32 y.o.   MRN: [id number]  PCP: Anselm Jungling, NP (at group home) Primary Cardiologist: Yvonne Kendall, MD (last seen 10/24) HF provider: Marca Ancona, MD (last seen 03/24)  HPI:  [name] is a 32 y/o male with a history of DM, hyperlipidemia, HTN, paroxysmal atrial fibrillation, conversion disorder, sleep apnea, schizoaffective disorder, seizure, TIA, urethral stricture, tobacco use and chronic heart failure. Patient has had a long history of paroxysmal AF.  He says this was diagnosed when he was 20 and he was told that it was triggered by excessive use of energy drinks.  He never abused drugs or ETOH per his report though he was a smoker until 5/23.  He was followed in [place] in the past. There was talk of atrial fibrillation ablation but it was never undertaken. Subsequently, he moved to a group home in [place].    Patient has had home oxygen since 2022 per his report.  High resolution CT chest in 2/24 showed no ILD, possible small airways disease.  PFTs in 2/24 showed minimal obstructive airways disease.  Sleep study showed severe OSA.  Was in the ED 01/14/23 due to increasing swelling in both of his legs along with some difficulty breathing over the past 2 days. EKG shows no evidence of arrhythmia or ischemia and troponin within normal limits, chest x-ray negative for acute process.  No significant anemia, leukocytosis, tract abnormality, or AKI noted. Urinalysis with no signs of infection. IV lasix given. Was in the ED 01/23/23 due to shortness of breath and chest pain that started earlier today. Also complains of swelling in bilateral legs. EKG chest x-ray labs and vital signs are normal. IV lasix provided with higher outpatient lasix dose for a few days. Was in the ED 02/02/23 due to acute left-sided weakness. Patient reports he was awake at that time and around 1:40 AM developed sudden nausea, pulsating in his head and a pounding  headache with diffuse blurry vision throughout his visual fields. CT and angiogram are reassuring, no LVO. Subsequent MRI brain without evidence of acute pathology or acute strokes. With management of his migraine with Fioricet, Benadryl and Compazine, his symptoms resolved and neurologically back to baseline. Normal metabolic panel and CBC. Had 2 ED visits 09/24 for atypical chest pain and cystitis. ED visit 03/06/23 due to blood in the stool and severe abdominal pain. Abdominal CT negative. Was in the ED 04/04/23 due to fall, abdominal pain, near syncope and nausea. CT concerning for possible cystitis but UA is reassuring so does not seem to be UTI. The rest of his CT scans were all reassuring. Labs normal.    Echo 08/04/21: EF of 50% along with mild LVH Echo 10/22/21: EF of 45-50%. Echo 08/22/22: EF 55-60%.    RHC 10/07/22: Hemodynamics (mmHg) RA mean 2 RV 20/2 PA 23/8, mean 15 PCWP mean 3 Oxygen saturations: PA 74% AO 96% Cardiac Output (Fick) 9.71  Cardiac Index (Fick) 3.34   He presents today for a HF f/u visit with a chief complaint of moderate fatigue with minimal exertion. Chronic in nature. Has associated minimal shortness of breath, although does feet like it's worsening. Has associated frontal headache (since recent fall), left breast tenderness (since starting spironolactone) and pedal edema along with this. Denies chest pain, cough, palpitations, dizziness (except with recent fall thought to be due to vasovagal response), abdominal distention, weight gain or difficulty sleeping.   Trulicity started at last visit and he  denies any side effects with it that he's aware of. Does note a decreased appetite and just occasional nausea. At last visit, furosemide was decreased to 10mg  daily with additional 10mg  if needed.   Has recently had a mammogram due to bilateral breast tenderness.   Continues to live at an alternative living facility in [place] and is more pleased. He says that he's eating  better at new facility, getting outside more and hoping to get trained for employment. Receiving psychotherapy and behavioral therapy & says that is making a big difference.   ROS: All systems negative except as listed in HPI, PMH and Problem List.  SH:  Social History   Socioeconomic History   Marital status: Single    Spouse name: Not on file   Number of children: Not on file   Years of education: Not on file   Highest education level: Not on file  Occupational History   Not on file  Tobacco Use   Smoking status: Former    Current packs/day: 0.00    Types: Cigarettes    Start date: 08/2019    Quit date: 08/2021    Years since quitting: 1.6   Smokeless tobacco: Never  Vaping Use   Vaping status: Never Used  Substance and Sexual Activity   Alcohol use: Not Currently   Drug use: Never   Sexual activity: Not on file  Other Topics Concern   Not on file  Social History Narrative   Now living in a group home in [place].   Social Determinants of Health   Financial Resource Strain: Low Risk  (03/16/2021)   Received from [employer], [facility]   Overall Financial Resource Strain (CARDIA)    Difficulty of Paying Living Expenses: Not very hard  Food Insecurity: No Food Insecurity (09/01/2020)   Received from [facility], [facility]   Hunger Vital Sign    Worried About Running Out of Food in the Last Year: Never true    Ran Out of Food in the Last Year: Never true  Transportation Needs: Not on file  Physical Activity: Not on file  Stress: No Stress Concern Present (03/16/2021)   Received from [facility], [facility]   [employer] of Occupational Health - Occupational Stress Questionnaire    Feeling of Stress : Not at all  Social Connections: Unknown (09/27/2021)   Received from [facility], [facility]   Social Network    Social Network: Not on file  Intimate Partner Violence: Unknown (09/01/2021)   Received from [employer], [facility]   HITS     Physically Hurt: Not on file    Insult or Talk Down To: Not on file    Threaten Physical Harm: Not on file    Scream or Curse: Not on file    FH:  Family History  Problem Relation Age of Onset   Colon cancer Father    Breast cancer Paternal Grandmother     Past Medical History:  Diagnosis Date   Cardiomyopathy (HCC)    a.) TTE 08/04/2021: EF 50%; b.) TTE 10/22/2021: EF 45-50-%; c.) TTE 08/22/2022: EF 55-60%   Chest pain    CHF (congestive heart failure) (HCC)    a.) TTE 08/04/2021: EF 50%, mild LVH, RVSF norm; b.) TTE 10/22/2021: EF 45-50%, glob HK, RVSF norm; c.) TTE 08/22/2022: EF 55-60%, mild dil LV, RVSF norm.   Conversion disorder    Current use of long term anticoagulation    Dyspnea    GERD (gastroesophageal  reflux disease)    Hepatic steatosis    Hyperlipidemia    Hypertension    Insomnia    a.) melatonin PRN   Long term current use of anticoagulant    a.) apixaban   OSA on CPAP    Persistent atrial fibrillation and flutter (HCC)    a.) CHA2DS2VASc = 5 (CHF, HTN, TIA x 2, T2DM);  b.) rate/rhythm maintained on oral diltiazem + metoprolol succinate; chronically anticoagulated with apixaban   Schizoaffective disorder (HCC)    Seizure disorder (HCC)    T2DM (type 2 diabetes mellitus) (HCC)    TIA (transient ischemic attack)    Urethral stricture    a. 08/2021 s/p cystoscopy and urethral dilation.    Current Outpatient Medications  Medication Sig Dispense Refill   acetaminophen (TYLENOL) 325 MG tablet Take 2 tablets (650 mg total) by mouth every 4 (four) hours as needed. 100 tablet 2   albuterol (VENTOLIN HFA) 108 (90 Base) MCG/ACT inhaler Inhale 2 puffs into the lungs every 6 (six) hours as needed for wheezing. 1 each 2   allopurinol (ZYLOPRIM) 300 MG tablet Take 1 tablet (300 mg total) by mouth daily. 30 tablet 0   atorvastatin (LIPITOR) 80 MG tablet TAKE 1  TABLET BY MOUTH ONCE DAILY 24 tablet 10   baclofen (LIORESAL) 10 MG tablet Take 1 tablet (10 mg total) by  mouth 2 (two) times daily as needed for muscle spasms. Home med. (Patient taking differently: Take 10 mg by mouth 2 (two) times daily. Home med.) 30 each 0   benztropine (COGENTIN) 1 MG tablet Take 1 mg by mouth daily.     budesonide-formoterol (SYMBICORT) 160-4.5 MCG/ACT inhaler Inhale 2 puffs into the lungs 2 (two) times daily. 1 each 6   cyclobenzaprine (FLEXERIL) 5 MG tablet Take 5 mg by mouth 3 (three) times daily as needed for muscle spasms.     dapagliflozin propanediol (FARXIGA) 10 MG TABS tablet Take 1 tablet (10 mg total) by mouth daily before breakfast. 30 tablet 5   diltiazem (CARDIZEM CD) 360 MG 24 hr capsule Take 1 capsule (360 mg total) by mouth daily. 30 capsule 5   divalproex (DEPAKOTE) 500 MG DR tablet Take 2 tablets (1,000 mg total) by mouth 2 (two) times daily. (Patient taking differently: Take 1,000 mg by mouth daily at 6 (six) AM.)     Dulaglutide (TRULICITY) 0.75 MG/0.5ML SOPN Inject 0.75 mg into the skin once a week. 2 mL 0   ELIQUIS 5 MG TABS tablet TAKE 1 TABLET BY MOUTH TWICE DAILY 49 tablet 10   escitalopram (LEXAPRO) 10 MG tablet Take 20 mg by mouth daily.     fluticasone (FLONASE) 50 MCG/ACT nasal spray Place 2 sprays into both nostrils 2 (two) times daily.     furosemide (LASIX) 20 MG tablet Take 0.5 tablets (10 mg total) by mouth daily. 45 tablet 1   hydrOXYzine (ATARAX) 25 MG tablet Take 25 mg by mouth daily. At noon     hydrOXYzine (ATARAX) 50 MG tablet Take 50 mg by mouth in the morning and at bedtime.     INVEGA 9 MG 24 hr tablet Take 9 mg by mouth every morning.     lithium carbonate (LITHOBID) 300 MG CR tablet Take 600 mg by mouth every evening.     loratadine (CLARITIN) 10 MG tablet Take 10 mg by mouth daily.     melatonin 3 MG TABS tablet Take 3 mg by mouth at bedtime.     metoprolol succinate (TOPROL-XL)  100 MG 24 hr tablet TAKE 1 AND 1/2 TABLET (150MG ) BY MOUTH ONCE DAILY  WITH OR IMMEDIATELY FOLLOWING A MEAL. 42 tablet 9   omeprazole (PRILOSEC) 20 MG  capsule Take 20 mg by mouth daily.     ondansetron (ZOFRAN-ODT) 4 MG disintegrating tablet Take 1 tablet (4 mg total) by mouth every 8 (eight) hours as needed. 20 tablet 0   propranolol (INDERAL) 10 MG tablet Take 10 mg by mouth 2 (two) times daily.     sacubitril-valsartan (ENTRESTO) 24-26 MG TAKE 1 TABLET BY MOUTH TWICE DAILY 60 tablet 11   spironolactone (ALDACTONE) 25 MG tablet Take 1 tablet (25 mg total) by mouth daily. 90 tablet 3   tamsulosin (FLOMAX) 0.4 MG CAPS capsule Take 1 capsule (0.4 mg total) by mouth daily. 30 capsule 2   No current facility-administered medications for this visit.   Vitals:   04/11/23 1444  BP: 128/72  Pulse: 70  SpO2: 96%  Weight: (!) 360 lb (163.3 kg)   Wt Readings from Last 3 Encounters:  04/11/23 (!) 360 lb (163.3 kg)  04/04/23 (!) 351 lb (159.2 kg)  03/07/23 (!) 365 lb (165.6 kg)   Lab Results  Component Value Date   CREATININE 0.97 04/04/2023   CREATININE 0.88 03/06/2023   CREATININE 1.02 02/24/2023   PHYSICAL EXAM:  General:  Well appearing. No resp difficulty HEENT: normal Neck: supple. JVP flat. No lymphadenopathy or thryomegaly appreciated. Cor: PMI normal. Regular rate & rhythm. No rubs, gallops or murmurs. Lungs: clear Abdomen: soft, nontender, nondistended. No hepatosplenomegaly. No bruits or masses.  Extremities: no cyanosis, clubbing, rash, 2+ pitting edema right lower leg/ 1+ pitting left lower leg Neuro: alert & orientedx3, cranial nerves grossly intact. Moves all 4 extremities w/o difficulty. Affect pleasant.   ECG: not done  ReDs: 47% (was 38%)   ASSESSMENT & PLAN:  1: NICM with preserved ejection fraction- - suspect due to AF/ OSA - NYHA class III - euvolemic - weighing daily; reminded to call for an overnight weight gain of > 2 pounds or a weekly weight gain of > 5 pounds - weight down 5 pounds from last visit here 1 month ago - ReDs elevated at 47% - Echo 08/04/21: EF of 50% along with mild LVH - Echo 10/22/21:  EF of 45-50%. - Echo 08/22/22: EF 55-60%.   - RHC 10/07/22:   Hemodynamics (mmHg)   RA mean 2   RV 20/2   PA 23/8, mean 15   PCWP mean 3   Oxygen saturations:   PA 74%   AO 96%   Cardiac Output (Fick) 9.71    Cardiac Index (Fick) 3.34  - he says that he's eating better at the new facility that he's living at - not adding salt to his food - emphasized keeping daily fluid intake to 60 ounces daily  - continue farxiga 10mg  daily - increase furosemide back to 20mg  daily - continue metoprolol succinate 150mg  daily - continue entresto 24/26mg  BID - decrease spironolactone 12.5mg  daily due to gynecomastia pain; may have to stop it completely if pain continues - mammogram 04/06/23 was negative for cancer - saw HF provider Shirlee Latch) 03/24 - continue exercise (walking/ lifting weights) - BNP 04/04/23 was 49.6  2: HTN- - BP 128/72 - sees PCP at the group home  - BMP 04/04/23 showed sodium 136, potassium 4.1, creatinine 0.97 and GFR >60  3: Paroxysmal atrial fibrillation- - per his report, onset was around age 33 - saw cardiology ([place]) 10/24 -  continue diltiazem 360mg  daily - continue apixaban 5mg  BID - would pursue atrial fibrillation ablation if he has recurrence of atrial fibrillation once he is on bipap   4: Obesity- - A1c 08/05/22 was 5.8% - had medical nutrition visit 11/23/22 - lifestyle center referral placed today - increase trulicity to 1.5mg  weekly - reviewed the importance of decreasing fried/fatty foods and portion sizes to limit nausea/ bloating  5: Sleep apnea- - saw pulmonology Allison Quarry) 09/24 - Split night sleep study 07/07/22 showed severe OSA, AHI 143/hour with severe oxygen desaturations to 63%. Corrected with BIPAP 20/20cm h20 with large full facemask - still waiting on bipap although he says that the signed order has been sent to [facility] - waiting on oxygen (through [facility]) to get started at his new facility  6: DM- - A1c 08/05/22 was 5.8%  Return in 1 month,  sooner if needed.

## 2023-04-11 ENCOUNTER — Encounter: Payer: Self-pay | Admitting: Family

## 2023-04-11 ENCOUNTER — Ambulatory Visit: Payer: MEDICAID | Attending: Family | Admitting: Family

## 2023-04-11 VITALS — BP 128/72 | HR 70 | Wt 360.0 lb

## 2023-04-11 DIAGNOSIS — Z8673 Personal history of transient ischemic attack (TIA), and cerebral infarction without residual deficits: Secondary | ICD-10-CM | POA: Diagnosis not present

## 2023-04-11 DIAGNOSIS — Z79899 Other long term (current) drug therapy: Secondary | ICD-10-CM | POA: Insufficient documentation

## 2023-04-11 DIAGNOSIS — I4819 Other persistent atrial fibrillation: Secondary | ICD-10-CM | POA: Diagnosis not present

## 2023-04-11 DIAGNOSIS — Z7984 Long term (current) use of oral hypoglycemic drugs: Secondary | ICD-10-CM | POA: Diagnosis not present

## 2023-04-11 DIAGNOSIS — E669 Obesity, unspecified: Secondary | ICD-10-CM | POA: Insufficient documentation

## 2023-04-11 DIAGNOSIS — Z7985 Long-term (current) use of injectable non-insulin antidiabetic drugs: Secondary | ICD-10-CM | POA: Insufficient documentation

## 2023-04-11 DIAGNOSIS — I10 Essential (primary) hypertension: Secondary | ICD-10-CM | POA: Diagnosis not present

## 2023-04-11 DIAGNOSIS — I428 Other cardiomyopathies: Secondary | ICD-10-CM | POA: Diagnosis not present

## 2023-04-11 DIAGNOSIS — Z7182 Exercise counseling: Secondary | ICD-10-CM | POA: Insufficient documentation

## 2023-04-11 DIAGNOSIS — Z87891 Personal history of nicotine dependence: Secondary | ICD-10-CM | POA: Diagnosis not present

## 2023-04-11 DIAGNOSIS — I5032 Chronic diastolic (congestive) heart failure: Secondary | ICD-10-CM | POA: Diagnosis not present

## 2023-04-11 DIAGNOSIS — Z7901 Long term (current) use of anticoagulants: Secondary | ICD-10-CM | POA: Insufficient documentation

## 2023-04-11 DIAGNOSIS — G40909 Epilepsy, unspecified, not intractable, without status epilepticus: Secondary | ICD-10-CM | POA: Diagnosis not present

## 2023-04-11 DIAGNOSIS — E119 Type 2 diabetes mellitus without complications: Secondary | ICD-10-CM | POA: Diagnosis not present

## 2023-04-11 DIAGNOSIS — E785 Hyperlipidemia, unspecified: Secondary | ICD-10-CM | POA: Diagnosis not present

## 2023-04-11 DIAGNOSIS — N62 Hypertrophy of breast: Secondary | ICD-10-CM | POA: Insufficient documentation

## 2023-04-11 DIAGNOSIS — I480 Paroxysmal atrial fibrillation: Secondary | ICD-10-CM | POA: Diagnosis not present

## 2023-04-11 DIAGNOSIS — G4733 Obstructive sleep apnea (adult) (pediatric): Secondary | ICD-10-CM | POA: Diagnosis not present

## 2023-04-11 DIAGNOSIS — F259 Schizoaffective disorder, unspecified: Secondary | ICD-10-CM | POA: Diagnosis not present

## 2023-04-11 DIAGNOSIS — I110 Hypertensive heart disease with heart failure: Secondary | ICD-10-CM | POA: Insufficient documentation

## 2023-04-11 MED ORDER — FUROSEMIDE 20 MG PO TABS: 20.0000 mg | ORAL_TABLET | Freq: Every day | ORAL | 11 refills | Status: DC

## 2023-04-11 MED ORDER — TRULICITY 1.5 MG/0.5ML ~~LOC~~ SOAJ: 1.5000 mg | SUBCUTANEOUS | 3 refills | Status: DC

## 2023-04-11 MED ORDER — SPIRONOLACTONE 25 MG PO TABS: 12.5000 mg | ORAL_TABLET | Freq: Every day | ORAL | 3 refills | Status: DC

## 2023-04-11 NOTE — Patient Instructions (Addendum)
DECREASE SPIRONOLACTONE 12.5 MG ONCE DAILY (1/2 TABLET)  INCREASE TRULICITY TO 1.5 MG ONCE WEEKLY  INCREASE LASIX 20 MG ONCE DAILY

## 2023-04-11 NOTE — Progress Notes (Signed)
   04/11/23 1519  ReDS Vest / Clip  Station Marker D  Ruler Value 36  ReDS Value Range (!) > 40  ReDS Actual Value 47

## 2023-04-18 ENCOUNTER — Other Ambulatory Visit: Payer: Self-pay | Admitting: Family

## 2023-05-08 ENCOUNTER — Other Ambulatory Visit (HOSPITAL_COMMUNITY): Payer: Self-pay

## 2023-05-09 ENCOUNTER — Ambulatory Visit: Payer: MEDICAID | Attending: Family | Admitting: Family

## 2023-05-09 ENCOUNTER — Encounter: Payer: Self-pay | Admitting: Family

## 2023-05-09 VITALS — BP 111/77 | HR 90 | Wt 358.0 lb

## 2023-05-09 DIAGNOSIS — I509 Heart failure, unspecified: Secondary | ICD-10-CM | POA: Insufficient documentation

## 2023-05-09 DIAGNOSIS — I10 Essential (primary) hypertension: Secondary | ICD-10-CM | POA: Diagnosis not present

## 2023-05-09 DIAGNOSIS — I110 Hypertensive heart disease with heart failure: Secondary | ICD-10-CM | POA: Insufficient documentation

## 2023-05-09 DIAGNOSIS — Z7984 Long term (current) use of oral hypoglycemic drugs: Secondary | ICD-10-CM | POA: Insufficient documentation

## 2023-05-09 DIAGNOSIS — Z8673 Personal history of transient ischemic attack (TIA), and cerebral infarction without residual deficits: Secondary | ICD-10-CM | POA: Insufficient documentation

## 2023-05-09 DIAGNOSIS — Z79899 Other long term (current) drug therapy: Secondary | ICD-10-CM | POA: Diagnosis not present

## 2023-05-09 DIAGNOSIS — Z7182 Exercise counseling: Secondary | ICD-10-CM | POA: Insufficient documentation

## 2023-05-09 DIAGNOSIS — E669 Obesity, unspecified: Secondary | ICD-10-CM | POA: Diagnosis not present

## 2023-05-09 DIAGNOSIS — E119 Type 2 diabetes mellitus without complications: Secondary | ICD-10-CM | POA: Insufficient documentation

## 2023-05-09 DIAGNOSIS — G4733 Obstructive sleep apnea (adult) (pediatric): Secondary | ICD-10-CM | POA: Diagnosis not present

## 2023-05-09 DIAGNOSIS — I4819 Other persistent atrial fibrillation: Secondary | ICD-10-CM | POA: Diagnosis not present

## 2023-05-09 DIAGNOSIS — I428 Other cardiomyopathies: Secondary | ICD-10-CM | POA: Insufficient documentation

## 2023-05-09 DIAGNOSIS — I5032 Chronic diastolic (congestive) heart failure: Secondary | ICD-10-CM | POA: Diagnosis not present

## 2023-05-09 DIAGNOSIS — Z7985 Long-term (current) use of injectable non-insulin antidiabetic drugs: Secondary | ICD-10-CM | POA: Insufficient documentation

## 2023-05-09 DIAGNOSIS — R0602 Shortness of breath: Secondary | ICD-10-CM | POA: Insufficient documentation

## 2023-05-09 DIAGNOSIS — Z87891 Personal history of nicotine dependence: Secondary | ICD-10-CM | POA: Insufficient documentation

## 2023-05-09 DIAGNOSIS — Z7901 Long term (current) use of anticoagulants: Secondary | ICD-10-CM | POA: Insufficient documentation

## 2023-05-09 DIAGNOSIS — I480 Paroxysmal atrial fibrillation: Secondary | ICD-10-CM

## 2023-05-09 DIAGNOSIS — R002 Palpitations: Secondary | ICD-10-CM | POA: Insufficient documentation

## 2023-05-09 DIAGNOSIS — E785 Hyperlipidemia, unspecified: Secondary | ICD-10-CM | POA: Insufficient documentation

## 2023-05-09 MED ORDER — SALINE SPRAY 0.65 % NA SOLN: 1.0000 | NASAL | 5 refills | Status: AC | PRN

## 2023-05-09 MED ORDER — TRULICITY 3 MG/0.5ML ~~LOC~~ SOAJ: 3.0000 mg | SUBCUTANEOUS | 5 refills | Status: DC

## 2023-05-09 MED ORDER — DAPAGLIFLOZIN PROPANEDIOL 10 MG PO TABS: 10.0000 mg | ORAL_TABLET | Freq: Every day | ORAL | 5 refills | Status: DC

## 2023-05-09 NOTE — Patient Instructions (Addendum)
INCREASE YOUR TRULICITY TO 3 MG ONCE WEEKLY

## 2023-05-09 NOTE — Progress Notes (Signed)
Patient ID: Daniel [name], male    DOB: March 23, 1991, 32 y.o.   MRN: [id number]  PCP: Anselm Jungling, NP (at group home) Primary Cardiologist: Yvonne Kendall, MD (last seen 10/24) HF provider: Marca Ancona, MD (last seen 03/24)  HPI:  [name] is a 32 y/o male with a history of DM, hyperlipidemia, HTN, paroxysmal atrial fibrillation, conversion disorder, sleep apnea, schizoaffective disorder, seizure, TIA, urethral stricture, tobacco use and chronic heart failure. Patient has had a long history of paroxysmal AF.  He says this was diagnosed when he was 20 and he was told that it was triggered by excessive use of energy drinks.  He never abused drugs or ETOH per his report though he was a smoker until 5/23.  He was followed in [place] in the past. There was talk of atrial fibrillation ablation but it was never undertaken. Subsequently, he moved to a group home in [place].    Patient has had home oxygen since 2022 per his report.  High resolution CT chest in 2/24 showed no ILD, possible small airways disease.  PFTs in 2/24 showed minimal obstructive airways disease.  Sleep study showed severe OSA.  Was in the ED 01/14/23 due to increasing swelling in both of his legs along with some difficulty breathing over the past 2 days. EKG shows no evidence of arrhythmia or ischemia and troponin within normal limits, chest x-ray negative for acute process.  No significant anemia, leukocytosis, tract abnormality, or AKI noted. Urinalysis with no signs of infection. IV lasix given. Was in the ED 01/23/23 due to shortness of breath and chest pain that started earlier today. Also complains of swelling in bilateral legs. EKG chest x-ray labs and vital signs are normal. IV lasix provided with higher outpatient lasix dose for a few days. Was in the ED 02/02/23 due to acute left-sided weakness. Patient reports he was awake at that time and around 1:40 AM developed sudden nausea, pulsating in his head and a pounding  headache with diffuse blurry vision throughout his visual fields. CT and angiogram are reassuring, no LVO. Subsequent MRI brain without evidence of acute pathology or acute strokes. With management of his migraine with Fioricet, Benadryl and Compazine, his symptoms resolved and neurologically back to baseline. Normal metabolic panel and CBC. Had 2 ED visits 09/24 for atypical chest pain and cystitis. ED visit 03/06/23 due to blood in the stool and severe abdominal pain. Abdominal CT negative. Was in the ED 04/04/23 due to fall, abdominal pain, near syncope and nausea. CT concerning for possible cystitis but UA is reassuring so does not seem to be UTI. The rest of his CT scans were all reassuring. Labs normal.    Echo 08/04/21: EF of 50% along with mild LVH Echo 10/22/21: EF of 45-50%. Echo 08/22/22: EF 55-60%.    RHC 10/07/22: Hemodynamics (mmHg) RA mean 2 RV 20/2 PA 23/8, mean 15 PCWP mean 3 Oxygen saturations: PA 74% AO 96% Cardiac Output (Fick) 9.71  Cardiac Index (Fick) 3.34   He presents today for a HF f/u visit with a chief complaint of moderate shortness of breath with minimal exertion. Chronic in nature although he feels like it's a little worse over the last 1-2 weeks since he's been out of farxiga. Has associated fatigue, nosebleeds, pedal edema with L>R (worsening), breast tenderness (improved) and occasional palpitations along with this. Denies cough, abdominal distention, chest pain, dizziness or difficulty sleeping. Both he and caregiver say that his pharmacy didn't put his farxiga in his pill  packs so he's been without farxiga for ~ 1 week. Is also having similar issues with his lithium.   At last visit, spironolactone was decreased to 12.5mg  daily due to breast tenderness. He says that the tenderness continues but is much better.   Continues to live at an alternative living facility in [place]. He says that he's eating better at new facility, getting outside more and hoping to get  trained for employment. Receiving psychotherapy and behavioral therapy & says that is making a big difference. Is going to a dance tonight and has upcoming dietician appointment. Mom is coming to visit this weekend as well.   ROS: All systems negative except as listed in HPI, PMH and Problem List.  SH:  Social History   Socioeconomic History   Marital status: Single    Spouse name: Not on file   Number of children: Not on file   Years of education: Not on file   Highest education level: Not on file  Occupational History   Not on file  Tobacco Use   Smoking status: Former    Current packs/day: 0.00    Types: Cigarettes    Start date: 08/2019    Quit date: 08/2021    Years since quitting: 1.6   Smokeless tobacco: Never  Vaping Use   Vaping status: Never Used  Substance and Sexual Activity   Alcohol use: Not Currently   Drug use: Never   Sexual activity: Not on file  Other Topics Concern   Not on file  Social History Narrative   Now living in a group home in [place].   Social Determinants of Health   Financial Resource Strain: Low Risk  (03/16/2021)   Received from [employer], [facility]   Overall Financial Resource Strain (CARDIA)    Difficulty of Paying Living Expenses: Not very hard  Food Insecurity: No Food Insecurity (09/01/2020)   Received from [facility], [facility]   Hunger Vital Sign    Worried About Running Out of Food in the Last Year: Never true    Ran Out of Food in the Last Year: Never true  Transportation Needs: Not on file  Physical Activity: Not on file  Stress: No Stress Concern Present (03/16/2021)   Received from [facility], [name]   [employer] of Occupational [facility] - Occupational Stress Questionnaire    Feeling of Stress : Not at all  Social Connections: Unknown (09/27/2021)   Received from [facility], [facility]   Social Network    Social Network: Not on file  Intimate Partner Violence: Unknown (09/01/2021)    Received from [employer], [facility]   HITS    Physically Hurt: Not on file    Insult or Talk Down To: Not on file    Threaten Physical Harm: Not on file    Scream or Curse: Not on file    FH:  Family History  Problem Relation Age of Onset   Colon cancer Father    Breast cancer Paternal Grandmother     Past Medical History:  Diagnosis Date   Cardiomyopathy (HCC)    a.) TTE 08/04/2021: EF 50%; b.) TTE 10/22/2021: EF 45-50-%; c.) TTE 08/22/2022: EF 55-60%   Chest pain    CHF (congestive heart failure) (HCC)    a.) TTE 08/04/2021: EF 50%, mild LVH, RVSF norm; b.) TTE 10/22/2021: EF 45-50%, glob HK, RVSF norm; c.) TTE 08/22/2022: EF 55-60%, mild dil LV, RVSF norm.   Conversion disorder    Current  use of long term anticoagulation    Dyspnea    GERD (gastroesophageal reflux disease)    Hepatic steatosis    Hyperlipidemia    Hypertension    Insomnia    a.) melatonin PRN   Long term current use of anticoagulant    a.) apixaban   OSA on CPAP    Persistent atrial fibrillation and flutter (HCC)    a.) CHA2DS2VASc = 5 (CHF, HTN, TIA x 2, T2DM);  b.) rate/rhythm maintained on oral diltiazem + metoprolol succinate; chronically anticoagulated with apixaban   Schizoaffective disorder (HCC)    Seizure disorder (HCC)    T2DM (type 2 diabetes mellitus) (HCC)    TIA (transient ischemic attack)    Urethral stricture    a. 08/2021 s/p cystoscopy and urethral dilation.    Current Outpatient Medications  Medication Sig Dispense Refill   acetaminophen (TYLENOL) 325 MG tablet Take 2 tablets (650 mg total) by mouth every 4 (four) hours as needed. 100 tablet 2   albuterol (VENTOLIN HFA) 108 (90 Base) MCG/ACT inhaler Inhale 2 puffs into the lungs every 6 (six) hours as needed for wheezing. 1 each 2   allopurinol (ZYLOPRIM) 300 MG tablet Take 1 tablet (300 mg total) by mouth daily. 30 tablet 0   atorvastatin (LIPITOR) 80 MG tablet TAKE 1  TABLET BY MOUTH ONCE DAILY 24 tablet 10   baclofen  (LIORESAL) 10 MG tablet Take 1 tablet (10 mg total) by mouth 2 (two) times daily as needed for muscle spasms. Home med. (Patient taking differently: Take 10 mg by mouth 2 (two) times daily. Home med.) 30 each 0   benztropine (COGENTIN) 1 MG tablet Take 1 mg by mouth daily.     budesonide-formoterol (SYMBICORT) 160-4.5 MCG/ACT inhaler Inhale 2 puffs into the lungs 2 (two) times daily. 1 each 6   cyclobenzaprine (FLEXERIL) 5 MG tablet Take 5 mg by mouth 3 (three) times daily as needed for muscle spasms.     dapagliflozin propanediol (FARXIGA) 10 MG TABS tablet Take 1 tablet (10 mg total) by mouth daily before breakfast. 30 tablet 5   diltiazem (CARDIZEM CD) 360 MG 24 hr capsule Take 1 capsule (360 mg total) by mouth daily. 30 capsule 5   divalproex (DEPAKOTE) 500 MG DR tablet Take 2 tablets (1,000 mg total) by mouth 2 (two) times daily. (Patient taking differently: Take 1,000 mg by mouth daily at 6 (six) AM.)     Dulaglutide (TRULICITY) 0.75 MG/0.5ML SOPN Inject 0.75 mg into the skin once a week. 2 mL 0   Dulaglutide (TRULICITY) 1.5 MG/0.5ML SOAJ Inject 1.5 mg into the skin once a week. 2 mL 3   ELIQUIS 5 MG TABS tablet TAKE 1 TABLET BY MOUTH TWICE DAILY 49 tablet 10   escitalopram (LEXAPRO) 10 MG tablet Take 20 mg by mouth daily.     fluticasone (FLONASE) 50 MCG/ACT nasal spray Place 2 sprays into both nostrils 2 (two) times daily.     furosemide (LASIX) 20 MG tablet Take 1 tablet (20 mg total) by mouth daily. 30 tablet 11   hydrOXYzine (ATARAX) 25 MG tablet Take 25 mg by mouth daily. At noon     hydrOXYzine (ATARAX) 50 MG tablet Take 50 mg by mouth in the morning and at bedtime.     INVEGA 9 MG 24 hr tablet Take 9 mg by mouth every morning.     lithium carbonate (LITHOBID) 300 MG CR tablet Take 600 mg by mouth every evening.     loratadine (  CLARITIN) 10 MG tablet Take 10 mg by mouth daily.     melatonin 3 MG TABS tablet Take 3 mg by mouth at bedtime.     metoprolol succinate (TOPROL-XL) 100 MG 24  hr tablet TAKE 1 AND 1/2 TABLET (150MG ) BY MOUTH ONCE DAILY  WITH OR IMMEDIATELY FOLLOWING A MEAL. 42 tablet 9   omeprazole (PRILOSEC) 20 MG capsule Take 20 mg by mouth daily.     ondansetron (ZOFRAN-ODT) 4 MG disintegrating tablet Take 1 tablet (4 mg total) by mouth every 8 (eight) hours as needed. 20 tablet 0   propranolol (INDERAL) 10 MG tablet Take 10 mg by mouth 2 (two) times daily.     sacubitril-valsartan (ENTRESTO) 24-26 MG TAKE 1 TABLET BY MOUTH TWICE DAILY 60 tablet 11   spironolactone (ALDACTONE) 25 MG tablet Take 1 tablet (25 mg total) by mouth daily. 90 tablet 3   spironolactone (ALDACTONE) 25 MG tablet Take 0.5 tablets (12.5 mg total) by mouth daily. 45 tablet 3   tamsulosin (FLOMAX) 0.4 MG CAPS capsule Take 1 capsule (0.4 mg total) by mouth daily. 30 capsule 2   No current facility-administered medications for this visit.     PHYSICAL EXAM:  General:  Well appearing. No resp difficulty HEENT: normal Neck: supple. JVP flat. No lymphadenopathy or thryomegaly appreciated. Cor: PMI normal. Regular rate & rhythm. No rubs, gallops or murmurs. Lungs: clear Abdomen: soft, nontender, nondistended. No hepatosplenomegaly. No bruits or masses.  Extremities: no cyanosis, clubbing, rash, 2+ pitting edema left lower leg/ 1+ pitting right lower leg Neuro: alert & orientedx3, cranial nerves grossly intact. Moves all 4 extremities w/o difficulty. Affect pleasant.   ECG: not done  ASSESSMENT & PLAN:  1: NICM with preserved ejection fraction- - suspect due to AF/ OSA - NYHA class III - euvolemic - weighing daily; reminded to call for an overnight weight gain of > 2 pounds or a weekly weight gain of > 5 pounds - weight down 2 pounds from last visit here 1 month ago - Echo 08/04/21: EF of 50% along with mild LVH - Echo 10/22/21: EF of 45-50%. - Echo 08/22/22: EF 55-60%.   - RHC 10/07/22:   Hemodynamics (mmHg)   RA mean 2   RV 20/2   PA 23/8, mean 15   PCWP mean 3   Oxygen  saturations:   PA 74%   AO 96%   Cardiac Output (Fick) 9.71    Cardiac Index (Fick) 3.34  - not adding salt to his food; says that he has upcoming dietician appointment - emphasized keeping daily fluid intake to 60 ounces daily  - continue farxiga 10mg  daily; new RX sent to his pharmacy even though he has refills to hopefully get it back in his pill packs - continue furosemide 20mg  daily - continue metoprolol succinate 150mg  daily - continue entresto 24/26mg  BID - continue spironolactone 12.5mg  daily; patient says that gynecomastia has improved so will continue on it for now - mammogram 04/06/23 was negative for cancer - saw HF provider Shirlee Latch) 03/24 - continue exercise (walking/ lifting weights) - resume wearing compression socks with removal at bedtime - BNP 04/04/23 was 49.6  2: HTN- - BP 111/77 - sees PCP at the group home  - BMP 04/04/23 showed sodium 136, potassium 4.1, creatinine 0.97 and GFR >60  3: Paroxysmal atrial fibrillation- - per his report, onset was around age 27 - saw cardiology ([place]) 10/24 - continue diltiazem 360mg  daily - continue apixaban 5mg  BID - would pursue atrial  fibrillation ablation if he has recurrence of atrial fibrillation once he is on bipap   4: Obesity- - A1c 08/05/22 was 5.8% - had medical nutrition visit 11/23/22 - has lifestyle center appointment next week - increase trulicity to 3mg  weekly - reviewed the importance of decreasing fried/fatty foods and portion sizes to limit nausea/ bloating  5: Sleep apnea- - saw pulmonology Allison Quarry) 09/24 - Split night sleep study 07/07/22 showed severe OSA, AHI 143/hour with severe oxygen desaturations to 63%. Corrected with BIPAP 20/20cm h20 with large full facemask - wearing bipap nightly - nasal spray PRN to moisten his nares to hopefully decrease nosebleeds  6: DM- - A1c 08/05/22 was 5.8%  Return in 1 month, sooner if needed.

## 2023-05-16 ENCOUNTER — Other Ambulatory Visit: Payer: Self-pay | Admitting: Family

## 2023-05-17 ENCOUNTER — Encounter: Payer: MEDICAID | Attending: Family | Admitting: Dietician

## 2023-05-17 ENCOUNTER — Encounter: Payer: Self-pay | Admitting: Dietician

## 2023-05-17 DIAGNOSIS — Z6841 Body Mass Index (BMI) 40.0 and over, adult: Secondary | ICD-10-CM | POA: Diagnosis not present

## 2023-05-17 DIAGNOSIS — E785 Hyperlipidemia, unspecified: Secondary | ICD-10-CM | POA: Diagnosis not present

## 2023-05-17 DIAGNOSIS — I4891 Unspecified atrial fibrillation: Secondary | ICD-10-CM | POA: Diagnosis not present

## 2023-05-17 DIAGNOSIS — E119 Type 2 diabetes mellitus without complications: Secondary | ICD-10-CM | POA: Diagnosis not present

## 2023-05-17 DIAGNOSIS — I509 Heart failure, unspecified: Secondary | ICD-10-CM | POA: Diagnosis not present

## 2023-05-17 DIAGNOSIS — Z713 Dietary counseling and surveillance: Secondary | ICD-10-CM | POA: Diagnosis not present

## 2023-05-17 DIAGNOSIS — F250 Schizoaffective disorder, bipolar type: Secondary | ICD-10-CM

## 2023-05-17 DIAGNOSIS — I5032 Chronic diastolic (congestive) heart failure: Secondary | ICD-10-CM

## 2023-05-17 DIAGNOSIS — E669 Obesity, unspecified: Secondary | ICD-10-CM | POA: Diagnosis present

## 2023-05-17 DIAGNOSIS — I110 Hypertensive heart disease with heart failure: Secondary | ICD-10-CM | POA: Insufficient documentation

## 2023-05-17 DIAGNOSIS — I10 Essential (primary) hypertension: Secondary | ICD-10-CM

## 2023-05-17 NOTE — Progress Notes (Signed)
Medical Nutrition Therapy: Visit start time: 1045  end time: 1145  Assessment:   Referral Diagnosis: obesity Other medical history/ diagnoses: diabetes, CHF, HTN, HLD, atrial fibrillation Psychosocial issues/ stress concerns: schizoaffective disorder, bipolar  Medications, supplements: reconciled list in medical record   Current weight: 361.3lbs Height: 6'3.5" BMI: 44.56   Progress and evaluation:  Patient saw Hewitt Blade, RD, on 11/23/22. Patient states he wanted to restart MNT for ongoing weight loss.  Weight has decreased about 13lbs since 6/26 visit.  Patient is taking Trulicity, stopped Metformin.  HbA1C  5.8% 08/05/22; lab BG 94 04/04/23; BP 128/72 04/11/23 Food allergies: none  Special diet practices: none   Dietary Intake:  Usual eating pattern includes 2-3 meals and 2-3 snacks per day. Dining out frequency: ? meals per week. Who plans meals/ buys groceries? Self, aide Who prepares meals? Self, aide  Breakfast: cereal capt crunch, fruity pebbles, cocoa pebbles + juice (watermelon, etc "simply" Snack: vanilla yogurt, traditional or [place] Lunch: chicken patty sandwich with cheese, miracle whip + fries; burger; tacos; hot dogs (chicken); pizza from home Snack: was eating Halloween candy but now gone Supper: aide helps with some cooking; 12/17 sandwiches; sometimes skips dinner or lunch due to low appetite/ hunger Snack: granola bar if avail; sm bag chips/ doritos Beverages: water; juice in am and occ later in day; 2% milk; soda only rarely  Physical activity: track at gym, some weights, stretches/ chair exercise   Intervention:   Nutrition Care Education:   Basic nutrition: basic food groups; appropriate nutrient balance; appropriate meal and snack schedule; general nutrition guidelines    Weight control: importance of low sugar and low fat choices; portion control Diabetes/ prediabetes: appropriate meal and snack schedule; appropriate carb intake and balance, healthy carb  choices CHF:  importance of controlling BP; identifying high sodium foods, options for seasoning foods   Other intervention notes: Updated nutrition goals with input from patient and caregiver   Nutritional Diagnosis:  [place]-2.1 Inpaired nutrition utilization and [employer]-2.2 Altered nutrition-related laboratory As related to CHF, diabetes.  As evidenced by BP and heart rhythm as well as blood lipids controlled with medications . [place]-3.3 Overweight/obesity As related to excess calories and inadequate physical activity.  As evidenced by patient with current BMI of 44.56, in weigh loss pattern with help of GLP-1 medication.   Education Materials given:  Designer, industrial/product with food lists, sample meal pattern Sample menus Snacking handout Visit summary with goals/ instructions   Learner/ who was taught:  Patient  Caregiver Braxton  Level of understanding: Verbalizes/ demonstrates competency  Demonstrated degree of understanding via:   Teach back Learning barriers: None  Willingness to learn/ readiness for change: Eager, change in progress. Ability for significant change seems to be limited; patient is making gradual changes with help of caregiver.  Monitoring and Evaluation:  Dietary intake, exercise, and body weight      follow up:  06/29/23

## 2023-05-17 NOTE — Patient Instructions (Addendum)
If not hungry for a meal, eat at least a small amount of food or a healthy snack. Plan to eat every 3-5 hours during the day to keep blood sugar steady.  Use menus for meal ideas. You can substitute foods as long as they are from the same food group/ list.  Keep choosing low sodium or no-salt-added veggies, or choose frozen veggies Limit portions of starchy foods like pasta, rice, and potatoes to 1 cup (smaller than the size of a fisted hand) Stick with water and other sugar free drinks, great job so far! Drink only small amounts of juice, or look for low sugar juice like Ocean Spray diet 5

## 2023-05-24 ENCOUNTER — Emergency Department: Payer: MEDICAID

## 2023-05-24 ENCOUNTER — Emergency Department
Admission: EM | Admit: 2023-05-24 | Discharge: 2023-05-24 | Disposition: A | Discharge status: Home / Self Care | Payer: MEDICAID | Attending: Emergency Medicine | Admitting: Emergency Medicine

## 2023-05-24 ENCOUNTER — Other Ambulatory Visit: Payer: Self-pay

## 2023-05-24 DIAGNOSIS — I509 Heart failure, unspecified: Secondary | ICD-10-CM | POA: Insufficient documentation

## 2023-05-24 DIAGNOSIS — B349 Viral infection, unspecified: Secondary | ICD-10-CM | POA: Insufficient documentation

## 2023-05-24 DIAGNOSIS — I110 Hypertensive heart disease with heart failure: Secondary | ICD-10-CM | POA: Insufficient documentation

## 2023-05-24 DIAGNOSIS — R0602 Shortness of breath: Secondary | ICD-10-CM | POA: Insufficient documentation

## 2023-05-24 DIAGNOSIS — E119 Type 2 diabetes mellitus without complications: Secondary | ICD-10-CM | POA: Insufficient documentation

## 2023-05-24 DIAGNOSIS — Z1152 Encounter for screening for COVID-19: Secondary | ICD-10-CM | POA: Diagnosis not present

## 2023-05-24 DIAGNOSIS — Z7901 Long term (current) use of anticoagulants: Secondary | ICD-10-CM | POA: Insufficient documentation

## 2023-05-24 DIAGNOSIS — M791 Myalgia, unspecified site: Secondary | ICD-10-CM | POA: Diagnosis present

## 2023-05-24 LAB — CBC
HCT: 35.9 % — ABNORMAL LOW (ref 39.0–52.0)
Hemoglobin: 11.6 g/dL — ABNORMAL LOW (ref 13.0–17.0)
MCH: 28.8 pg (ref 26.0–34.0)
MCHC: 32.3 g/dL (ref 30.0–36.0)
MCV: 89.1 fL (ref 80.0–100.0)
Platelets: 252 10*3/uL (ref 150–400)
RBC: 4.03 MIL/uL — ABNORMAL LOW (ref 4.22–5.81)
RDW: 14.2 % (ref 11.5–15.5)
WBC: 7.9 10*3/uL (ref 4.0–10.5)
nRBC: 0 % (ref 0.0–0.2)

## 2023-05-24 LAB — BASIC METABOLIC PANEL
Anion gap: 10 (ref 5–15)
BUN: 20 mg/dL (ref 6–20)
CO2: 27 mmol/L (ref 22–32)
Calcium: 9 mg/dL (ref 8.9–10.3)
Chloride: 97 mmol/L — ABNORMAL LOW (ref 98–111)
Creatinine, Ser: 1.11 mg/dL (ref 0.61–1.24)
GFR, Estimated: >60 mL/min (ref >60)
Glucose, Bld: 105 mg/dL — ABNORMAL HIGH (ref 70–99)
Potassium: 4.1 mmol/L (ref 3.5–5.1)
Sodium: 134 mmol/L — ABNORMAL LOW (ref 135–145)

## 2023-05-24 LAB — RESP PANEL BY RT-PCR (RSV, FLU A&B, COVID)  RVPGX2
Influenza A by PCR: NEGATIVE
Influenza B by PCR: NEGATIVE
Resp Syncytial Virus by PCR: NEGATIVE
SARS Coronavirus 2 by RT PCR: NEGATIVE

## 2023-05-24 LAB — BRAIN NATRIURETIC PEPTIDE: B Natriuretic Peptide: 80.2 pg/mL (ref 0.0–100.0)

## 2023-05-24 LAB — TROPONIN I (HIGH SENSITIVITY): Troponin I (High Sensitivity): 4 ng/L (ref <18)

## 2023-05-24 NOTE — Discharge Instructions (Signed)
Your exam, labs, EKG, and chest x-ray are all normal and reassuring at this time.  No signs of COVID, flu, RSV, pneumonia, A-fib, or CHF exacerbation.  Continue with your home meds and follow-up with the CHF clinic as discussed.

## 2023-05-24 NOTE — ED Provider Notes (Signed)
[facility] Emergency Department Provider Note     Event Date/Time   First MD Initiated Contact with Patient 05/24/23 1832     (approximate)   History   Generalized Body Aches   HPI  Daniel [name] is a 32 y.o. male with a history of HTN, HLD, TIA, diabetes, CHF, OSA, and A-fib on Eliquis, presents to the ED for evaluation of generalized fatigue and malaise.  Patient would endorse body chills, and generally feeling unwell.  He ports taking his prescription medicines as directed including his twice daily Lasix.  He notes some pitting edema to the lower legs bilaterally.  Denies any frank fevers, nausea, vomiting, chest pain, shortness of breath.  Physical Exam   Triage Vital Signs: ED Triage Vitals  Encounter Vitals Group     BP 05/24/23 1821 134/72     Systolic BP Percentile --      Diastolic BP Percentile --      Pulse Rate 05/24/23 1821 91     Resp 05/24/23 1821 18     Temp 05/24/23 1821 99.1 F (37.3 C)     Temp Source 05/24/23 1821 Oral     SpO2 05/24/23 1821 96 %     Weight 05/24/23 1822 (!) 371 lb (168.3 kg)     Height 05/24/23 1822 6\' 5"  (1.956 m)     Head Circumference --      Peak Flow --      Pain Score 05/24/23 1821 10     Pain Loc --      Pain Education --      Exclude from Growth Chart --     Most recent vital signs: Vitals:   05/24/23 2230 05/24/23 2329  BP: (!) 99/58   Pulse: 93 76  Resp: (!) 22 16  Temp:  98.7 F (37.1 C)  SpO2: 97% 94%    General Awake, no distress. NAD HEENT NCAT. PERRL. EOMI. No rhinorrhea. Mucous membranes are moist.  CV:  Good peripheral perfusion. RRR.  2+ pitting edema to the bilateral lower extremities. RESP:  Normal effort. CTA ABD:  No distention.    ED Results / Procedures / Treatments   Labs (all labs ordered are listed, but only abnormal results are displayed) Labs Reviewed  BASIC METABOLIC PANEL - Abnormal; Notable for the following components:      Result Value   Sodium 134  (*)    Chloride 97 (*)    Glucose, Bld 105 (*)    All other components within normal limits  CBC - Abnormal; Notable for the following components:   RBC 4.03 (*)    Hemoglobin 11.6 (*)    HCT 35.9 (*)    All other components within normal limits  RESP PANEL BY RT-PCR (RSV, FLU A&B, COVID)  RVPGX2  BRAIN NATRIURETIC PEPTIDE  TROPONIN I (HIGH SENSITIVITY)    EKG  Vent. rate 79 BPM PR interval 160 ms QRS duration 88 ms QT/QTcB 438/502 ms P-R-T axes 66 75 35 Normal sinus rhythm Prolonged QT Abnormal ECG When compared with ECG of 04-Apr-2023 17:49, No significant change was found  RADIOLOGY  I personally viewed and evaluated these images as part of my medical decision making, as well as reviewing the written report by the radiologist.  ED Provider Interpretation: No acute findings  DG Chest 2 View Result Date: 05/24/2023 CLINICAL DATA:  Shortness of breath EXAM: CHEST - 2 VIEW COMPARISON:  04/04/2023 FINDINGS: The heart size and mediastinal contours are within  normal limits. Both lungs are clear. The visualized skeletal structures are unremarkable. IMPRESSION: No active cardiopulmonary disease. Electronically Signed   By: Charlett Nose M.D.   On: 05/24/2023 19:46   PROCEDURES:  Critical Care performed: No  Procedures   MEDICATIONS ORDERED IN ED: Medications - No data to display   IMPRESSION / MDM / ASSESSMENT AND PLAN / ED COURSE  I reviewed the triage vital signs and the nursing notes.                              Differential diagnosis includes, but is not limited to, HF exacerbation, ACS, aortic dissection, pulmonary embolism, cardiac tamponade, pneumothorax, pneumonia, pericarditis, myocarditis, GI-related causes including esophagitis/gastritis, and musculoskeletal chest wall pain COVID, flu, RSV, viral URI  Patient's presentation is most consistent with acute complicated illness / injury requiring diagnostic workup.  Patient's diagnosis is consistent with viral  syndrome.  With reassuring exam and workup at this time.  Labs are reassuring at this time with no acute lab abnormalities.  Troponin is flat and BNP is within normal limits.  Patient vital signs are stable including normal room air sats.  No x-ray evidence of any acute intrathoracic process or fluid overload.  EKG without evidence of malignant arrhythmia, A-fib, or tachycardia.  Patient has requested and been given a sandwich tray in the ED.  His legal guardians been notified of his stable workup at this time.  Patient will be discharged home with ductions to take his home meds. Patient is to follow up with the heart failure clinic and his PCP as discussed, as needed or otherwise directed. Patient is given ED precautions to return to the ED for any worsening or new symptoms.  FINAL CLINICAL IMPRESSION(S) / ED DIAGNOSES   Final diagnoses:  Viral syndrome     Rx / DC Orders   ED Discharge Orders     None        Note:  This document was prepared using Dragon voice recognition software and may include unintentional dictation errors.    Lissa Hoard, PA-C 05/24/23 2350    Trinna Post, MD 05/25/23 2325

## 2023-05-24 NOTE — ED Notes (Signed)
Per lab tech sample not given to lab tech has sample now and will process now

## 2023-05-24 NOTE — ED Notes (Signed)
Daniel [name] pt legal guardian to arrive eta 20 minutes for transport return to residence pt aox4 pt provided [place] lunch tray pt bilateral lower extremitiy edema remains unchanged pt deemed appropriate for d/c to residence

## 2023-05-24 NOTE — ED Triage Notes (Signed)
Pt sts that he has been feeling ill and not able to get warm, body chills, feeling of general malaise.

## 2023-05-24 NOTE — ED Notes (Signed)
Attempted to contact Daniel [name] Legal Guardian Emergency Contact [phone] To discuss dispo. No answer at above number

## 2023-05-24 NOTE — ED Notes (Signed)
Pt transport and guardian Daniel [name] arrives to transport pt to residence pt provided further follow up with cardiology physician and PCP for continued CHF care pt aox4 speaking in full clear sentences able to stand and ambulate without apparent difficulty pt non labored resps @ this time speaking full clear sentences deemed appropriate for dc to residence

## 2023-05-29 ENCOUNTER — Other Ambulatory Visit: Payer: Self-pay | Admitting: Family

## 2023-05-29 MED ORDER — ATORVASTATIN CALCIUM 80 MG PO TABS: 80.0000 mg | ORAL_TABLET | Freq: Every day | ORAL | 10 refills | Status: DC

## 2023-06-06 ENCOUNTER — Ambulatory Visit: Payer: MEDICAID | Attending: Adult Health | Admitting: Adult Health

## 2023-06-06 ENCOUNTER — Encounter: Payer: MEDICAID | Admitting: Adult Health

## 2023-06-06 ENCOUNTER — Encounter: Payer: Self-pay | Admitting: Adult Health

## 2023-06-06 VITALS — BP 106/74 | HR 78 | Wt 355.0 lb

## 2023-06-06 DIAGNOSIS — I480 Paroxysmal atrial fibrillation: Secondary | ICD-10-CM

## 2023-06-06 DIAGNOSIS — G4730 Sleep apnea, unspecified: Secondary | ICD-10-CM

## 2023-06-06 DIAGNOSIS — I5032 Chronic diastolic (congestive) heart failure: Secondary | ICD-10-CM

## 2023-06-06 NOTE — Progress Notes (Signed)
 Patient ID: Daniel [name], male    DOB: 06-24-1990, 33 y.o.   MRN: [id number]  PCP: Gwenith Shuck, NP (at group home) Primary Cardiologist: Mady Bruckner, MD  HF provider: Rolan Barrack, MD   Chief Complaint: Heart Failure   HPI: Daniel [name] is a 33 y/o male with a history of DM, hyperlipidemia, HTN, paroxysmal atrial fibrillation, conversion disorder, sleep apnea, schizoaffective disorder, seizure, TIA, urethral stricture, former tobacco use and chronic heart failure. Patient has had a long history of paroxysmal AF.  He says this was diagnosed when he was 20 and he was told that it was triggered by excessive use of energy drinks.  He never abused drugs or ETOH per his report though he was a smoker until 5/23.  He was followed in [place] in the past. There was talk of atrial fibrillation ablation but it was never undertaken. Subsequently, he moved to a group home in [place].    Patient has had home oxygen since 2022 per his report.  High resolution CT chest in 2/24 showed no ILD, possible small airways disease.  PFTs in 2/24 showed minimal obstructive airways disease.  Sleep study showed severe OSA.  Was in the ED 01/14/23 due to increasing swelling in both of his legs along with some difficulty breathing over the past 2 days. EKG shows no evidence of arrhythmia or ischemia and troponin within normal limits, chest x-ray negative for acute process.  No significant anemia, leukocytosis, tract abnormality, or AKI noted. Urinalysis with no signs of infection. IV lasix  given. Was in the ED 01/23/23 due to shortness of breath and chest pain that started earlier today. Also complains of swelling in bilateral legs. EKG chest x-ray labs and vital signs are normal. IV lasix  provided with higher outpatient lasix  dose for a few days. Was in the ED 02/02/23 due to acute left-sided weakness. Patient reports he was awake at that time and around 1:40 AM developed sudden nausea, pulsating in his head and a  pounding headache with diffuse blurry vision throughout his visual fields. CT and angiogram are reassuring, no LVO. Subsequent MRI brain without evidence of acute pathology or acute strokes. With management of his migraine with Fioricet , Benadryl  and Compazine , his symptoms resolved and neurologically back to baseline. Normal metabolic panel and CBC. Had 2 ED visits 09/24 for atypical chest pain and cystitis. ED visit 03/06/23 due to blood in the stool and severe abdominal pain. Abdominal CT negative. Was in the ED 04/04/23 due to fall, abdominal pain, near syncope and nausea. CT concerning for possible cystitis but UA is reassuring so does not seem to be UTI. The rest of his CT scans were all reassuring.   Today he returns for HF follow up with group home personel. Overall feeling fine. Denies SOB/PND/Orthopnea. Walking a lot. Appetite ok.Using Bipap every night.  No fever or chills. Weight at home trending down 350-->343 pounds. Taking all medications. He lives at [facility]. His medications are provided by personal at [facility].     Cardiac Test Echo 08/04/21: EF of 50% along with mild LVH Echo 10/22/21: EF of 45-50%. Echo 08/22/22: EF 55-60%.    RHC 10/07/22: Hemodynamics (mmHg) RA mean 2 RV 20/2 PA 23/8, mean 15 PCWP mean 3 Oxygen saturations: PA 74% AO 96% Cardiac Output (Fick) 9.71  Cardiac Index (Fick) 3.34   ROS: All systems negative except as listed in HPI, PMH and Problem List.  SH:  Social History   Socioeconomic History   Marital status:  Single    Spouse name: Not on file   Number of children: Not on file   Years of education: Not on file   Highest education level: Not on file  Occupational History   Not on file  Tobacco Use   Smoking status: Former    Current packs/day: 0.00    Types: Cigarettes    Start date: 08/2019    Quit date: 08/2021    Years since quitting: 1.7   Smokeless tobacco: Never  Vaping Use   Vaping status: Never Used  Substance and  Sexual Activity   Alcohol use: Not Currently   Drug use: Never   Sexual activity: Not on file  Other Topics Concern   Not on file  Social History Narrative   Now living in a group home in [place].   Social Drivers of Health   Financial Resource Strain: Low Risk  (03/16/2021)   Received from [employer], [facility]   Overall Financial Resource Strain (CARDIA)    Difficulty of Paying Living Expenses: Not very hard  Food Insecurity: No Food Insecurity (09/01/2020)   Received from [facility], [facility]   Hunger Vital Sign    Worried About Running Out of Food in the Last Year: Never true    Ran Out of Food in the Last Year: Never true  Transportation Needs: Not on file  Physical Activity: Not on file  Stress: No Stress Concern Present (03/16/2021)   Received from [facility], [facility]   [employer] of Occupational [facility] - Occupational Stress Questionnaire    Feeling of Stress : Not at all  Social Connections: Unknown (09/27/2021)   Received from [facility], [facility]   Social Network    Social Network: Not on file  Intimate Partner Violence: Unknown (09/01/2021)   Received from [employer], [facility]   HITS    Physically Hurt: Not on file    Insult or Talk Down To: Not on file    Threaten Physical Harm: Not on file    Scream or Curse: Not on file    FH:  Family History  Problem Relation Age of Onset   Colon cancer Father    Breast cancer Paternal Grandmother     Past Medical History:  Diagnosis Date   Cardiomyopathy (HCC)    a.) TTE 08/04/2021: EF 50%; b.) TTE 10/22/2021: EF 45-50-%; c.) TTE 08/22/2022: EF 55-60%   Chest pain    CHF (congestive heart failure) (HCC)    a.) TTE 08/04/2021: EF 50%, mild LVH, RVSF norm; b.) TTE 10/22/2021: EF 45-50%, glob HK, RVSF norm; c.) TTE 08/22/2022: EF 55-60%, mild dil LV, RVSF norm.   Conversion disorder    Current use of long term anticoagulation    Dyspnea    GERD (gastroesophageal reflux  disease)    Hepatic steatosis    Hyperlipidemia    Hypertension    Insomnia    a.) melatonin PRN   Long term current use of anticoagulant    a.) apixaban    OSA on CPAP    Persistent atrial fibrillation and flutter (HCC)    a.) CHA2DS2VASc = 5 (CHF, HTN, TIA x 2, T2DM);  b.) rate/rhythm maintained on oral diltiazem  + metoprolol  succinate; chronically anticoagulated with apixaban    Schizoaffective disorder (HCC)    Seizure disorder (HCC)    T2DM (type 2 diabetes mellitus) (HCC)    TIA (transient ischemic attack)    Urethral stricture    a. 08/2021 s/p cystoscopy and urethral dilation.  Current Outpatient Medications  Medication Sig Dispense Refill   acetaminophen  (TYLENOL ) 325 MG tablet Take 2 tablets (650 mg total) by mouth every 4 (four) hours as needed. 100 tablet 2   albuterol  (VENTOLIN  HFA) 108 (90 Base) MCG/ACT inhaler Inhale 2 puffs into the lungs every 6 (six) hours as needed for wheezing. 1 each 2   allopurinol  (ZYLOPRIM ) 300 MG tablet Take 1 tablet (300 mg total) by mouth daily. 30 tablet 0   apixaban  (ELIQUIS ) 5 MG TABS tablet @ TAKE 1 TABLET BY MOUTH TWICE DAILY 60 tablet 11   atorvastatin  (LIPITOR ) 80 MG tablet Take 1 tablet (80 mg total) by mouth daily. 30 tablet 10   baclofen  (LIORESAL ) 10 MG tablet Take 1 tablet (10 mg total) by mouth 2 (two) times daily as needed for muscle spasms. Home med. (Patient taking differently: Take 10 mg by mouth 2 (two) times daily. Home med.) 30 each 0   benztropine  (COGENTIN ) 1 MG tablet Take 1 mg by mouth daily.     budesonide -formoterol  (SYMBICORT ) 160-4.5 MCG/ACT inhaler Inhale 2 puffs into the lungs 2 (two) times daily. 1 each 6   cyclobenzaprine  (FLEXERIL ) 5 MG tablet Take 5 mg by mouth 3 (three) times daily as needed for muscle spasms.     dapagliflozin  propanediol (FARXIGA ) 10 MG TABS tablet Take 1 tablet (10 mg total) by mouth daily before breakfast. 30 tablet 5   diltiazem  (CARDIZEM  CD) 360 MG 24 hr capsule Take 1 capsule (360 mg  total) by mouth daily. 30 capsule 5   divalproex  (DEPAKOTE ) 500 MG DR tablet Take 2 tablets (1,000 mg total) by mouth 2 (two) times daily. (Patient taking differently: Take 1,000 mg by mouth daily at 6 (six) AM.)     Dulaglutide  (TRULICITY ) 3 MG/0.5ML SOAJ Inject 3 mg as directed once a week. 2 mL 5   escitalopram  (LEXAPRO ) 10 MG tablet Take 20 mg by mouth daily.     fluticasone  (FLONASE ) 50 MCG/ACT nasal spray Place 2 sprays into both nostrils 2 (two) times daily.     furosemide  (LASIX ) 20 MG tablet Take 1 tablet (20 mg total) by mouth daily. 30 tablet 11   hydrOXYzine  (ATARAX ) 25 MG tablet Take 25 mg by mouth daily. At noon     hydrOXYzine  (ATARAX ) 50 MG tablet Take 50 mg by mouth in the morning and at bedtime.     INVEGA  9 MG 24 hr tablet Take 9 mg by mouth every morning.     lithium  carbonate (LITHOBID ) 300 MG CR tablet Take 600 mg by mouth every evening.     loratadine  (CLARITIN ) 10 MG tablet Take 10 mg by mouth daily.     melatonin 3 MG TABS tablet Take 3 mg by mouth at bedtime.     metoprolol  succinate (TOPROL -XL) 100 MG 24 hr tablet TAKE 1 AND 1/2 TABLET (150MG ) BY MOUTH ONCE DAILY  WITH OR IMMEDIATELY FOLLOWING A MEAL. 42 tablet 9   omeprazole (PRILOSEC) 20 MG capsule Take 20 mg by mouth daily.     ondansetron  (ZOFRAN -ODT) 4 MG disintegrating tablet Take 1 tablet (4 mg total) by mouth every 8 (eight) hours as needed. 20 tablet 0   propranolol (INDERAL) 10 MG tablet Take 10 mg by mouth 2 (two) times daily.     sacubitril -valsartan  (ENTRESTO ) 24-26 MG TAKE 1 TABLET BY MOUTH TWICE DAILY 60 tablet 11   sodium chloride  (OCEAN) 0.65 % SOLN nasal spray Place 1 spray into both nostrils as needed for congestion. 10 mL 5  spironolactone  (ALDACTONE ) 25 MG tablet Take 0.5 tablets (12.5 mg total) by mouth daily. 45 tablet 3   tamsulosin  (FLOMAX ) 0.4 MG CAPS capsule Take 1 capsule (0.4 mg total) by mouth daily. 30 capsule 2   No current facility-administered medications for this visit.   Vitals:    06/06/23 1215  BP: 106/74  Pulse: 78  SpO2: 96%   Wt Readings from Last 3 Encounters:  06/06/23 (!) 355 lb (161 kg)  05/24/23 (!) 371 lb (168.3 kg)  05/17/23 (!) 361 lb 4.8 oz (163.9 kg)     PHYSICAL EXAM: General:  Well appearing. No resp difficulty.  HEENT: normal Neck: supple. no JVD. Carotids 2+ bilat; no bruits. No lymphadenopathy or thryomegaly appreciated. Cor: PMI nondisplaced. Regular rate & rhythm. No rubs, gallops or murmurs. Lungs: clear Abdomen: soft, nontender, nondistended. No hepatosplenomegaly. No bruits or masses. Good bowel sounds. Extremities: no cyanosis, clubbing, rash, edema Neuro: alert & orientedx3, cranial nerves grossly intact. moves all 4 extremities w/o difficulty. Affect pleasant   ASSESSMENT & PLAN:  1: Chronic HFpEF - suspect due to AF/ OSA - Echo 08/04/21: EF of 50% along with mild LVH - Echo 10/22/21: EF of 45-50%. - Echo 08/22/22: EF 55-60%.   - NYHA II. Appears euvolemic. Continue lasix  20 mg daily  - continue farxiga  10mg  daily - continue metoprolol  succinate 150mg  daily - continue entresto  24/26mg  BID - continue spironolactone  12.5mg  daily, no issues.  I reviewed lab work from 12/25  2: HTN -Stable. Continue current regimen   3: Paroxysmal atrial fibrillation- -Regular on exam. Continue current plan with Toprol  and Diltiazem .  - Continue eliquis  5 mg twice a day.  4: Obesity- - - has lifestyle center appointment next week - increase trulicity  to 3mg  weekly  5: OSA - Using Bipap  - wearing bipap nightly  6: DM- - A1c 08/05/22 was 5.8%  Follow up with Dr Rolan in 6 months  Daniel Vandeusen NP-C  12:40 PM

## 2023-06-29 ENCOUNTER — Encounter: Payer: Self-pay | Admitting: Dietician

## 2023-06-29 ENCOUNTER — Encounter: Payer: MEDICAID | Attending: Family | Admitting: Dietician

## 2023-06-29 DIAGNOSIS — F250 Schizoaffective disorder, bipolar type: Secondary | ICD-10-CM | POA: Diagnosis present

## 2023-06-29 DIAGNOSIS — I5032 Chronic diastolic (congestive) heart failure: Secondary | ICD-10-CM | POA: Diagnosis present

## 2023-06-29 DIAGNOSIS — I10 Essential (primary) hypertension: Secondary | ICD-10-CM | POA: Diagnosis present

## 2023-06-29 NOTE — Patient Instructions (Addendum)
Divide up a large bag of snack food into smaller baggies (pretzels, crackers, chips) If you want to have cheese spread, use fat free cream cheese or Laughing Cow Light Get some fruit to have for snacks, and it's ok to have  Healthy bread = 100% whole grain, or sourdough. Healthy cereal = low sugar, high fiber like Oatmeal Squares or Bran Flakes or special k Good job adding meat to noodles for more protein. Add vegetables also with the noodles or as a side. Keep up the regular exercise, great job!!

## 2023-06-29 NOTE — Progress Notes (Signed)
Medical Nutrition Therapy: Visit start time: 1115  end time: 1145  Assessment:  Diagnosis: obesity Medical history changes: no changes Psychosocial issues/ stress concerns: schizoaffective disorder, bipolar  Medications, supplement changes: no changes per patient   Current weight: 355.2lbs Height: 6'3.5"  BMI: 43.81  Progress and evaluation:  Weight loss of about 6lbs since previous visit on 05/17/23 Patient reports decreased appetite with trulicity; he is drinking less juice, is adding meat to ramen noodles for protein, and eating fewer snacks    Dietary Intake:  Usual eating pattern includes 3 meals and 1-2 snacks per day.   Breakfast: 2 boiled eggs + oatmeal + yogurt; cereal Snack: none Lunch: sandwich with ham or [place] and cheese + yogurt or baked chips small bag; ramen noodles + added meat +/or veg; 2 tacos on flour tortillas with meat, cheese, salsa, sm amt sour cream; spaghetti Snack: yogurt/ pretzels/ currently out of fruit/ sometimes cookies Supper: caregiver cooks some days -- meat ie baked chicken + vegetables frozen (sometimes Voila meal from frozen) + starch potato(es) Snack: none or same as pm Beverages: 64oz water   Physical activity: some running (at least 1 lap on indoor track) + some weights ie leg press  Intervention:   Nutrition Care Education:  Basic nutrition: reviewed appropriate nutrient balance; healthy food choices   Weight control: reviewed progress since previous visit; portion control strategies -- pre-portioning snacks; importance of low fat and low sugar foods; importance of regular exercise   Other Intervention Notes: Patient has been making positive changes and is motivated to continue. He is encouraged by his weight loss. Has support from caregivers. Updated nutrition goals.   Nutritional Diagnosis:  [place]-2.1 Inpaired nutrition utilization and [place]-2.2 Altered nutrition-related laboratory As related to CHF, diabetes.  As evidenced by BP and  heart rhythm as well as blood lipids controlled with medications . [place]-3.3 Overweight/obesity As related to excess calories and inadequate physical activity.  As evidenced by patient with current BMI of 43.81, in weigh loss pattern with help of GLP-1 medication.   Education Materials given:  Visit summary with goals/ instructions   Learner/ who was taught:  Patient   Level of understanding: Understands key concepts   Demonstrated degree of understanding via:   Teach back Learning barriers: None  Willingness to learn/ readiness for change: Eager, change in progress  Monitoring and Evaluation:  Dietary intake, exercise, and body weight      follow up:  08/24/23 at 11:15am

## 2023-07-05 ENCOUNTER — Encounter: Payer: Self-pay | Admitting: Internal Medicine

## 2023-07-05 ENCOUNTER — Ambulatory Visit: Payer: MEDICAID | Attending: Internal Medicine | Admitting: Internal Medicine

## 2023-07-05 VITALS — BP 110/69 | HR 70 | Ht 74.0 in | Wt 349.0 lb

## 2023-07-05 DIAGNOSIS — E785 Hyperlipidemia, unspecified: Secondary | ICD-10-CM

## 2023-07-05 DIAGNOSIS — I4819 Other persistent atrial fibrillation: Secondary | ICD-10-CM | POA: Diagnosis not present

## 2023-07-05 DIAGNOSIS — I10 Essential (primary) hypertension: Secondary | ICD-10-CM | POA: Diagnosis not present

## 2023-07-05 DIAGNOSIS — I5032 Chronic diastolic (congestive) heart failure: Secondary | ICD-10-CM

## 2023-07-05 NOTE — Patient Instructions (Signed)
 Medication Instructions:  Your physician recommends that you continue on your current medications as directed. Please refer to the Current Medication list given to you today.   *If you need a refill on your cardiac medications before your next appointment, please call your pharmacy*   Lab Work: No labs ordered today    Testing/Procedures: No test ordered today    Follow-Up: At [facility], you and your health needs are our priority.  As part of our continuing mission to provide you with exceptional heart care, we have created designated Provider Care Teams.  These Care Teams include your primary Cardiologist (physician) and Advanced Practice Providers (APPs -  Physician Assistants and Nurse Practitioners) who all work together to provide you with the care you need, when you need it.  We recommend signing up for the patient portal called "MyChart".  Sign up information is provided on this After Visit Summary.  MyChart is used to connect with patients for Virtual Visits (Telemedicine).  Patients are able to view lab/test results, encounter notes, upcoming appointments, etc.  Non-urgent messages can be sent to your provider as well.   To learn more about what you can do with MyChart, go to [email or web address].    Your next appointment:   1 year(s)  Provider:   You may see Yvonne Kendall, MD or one of the following Advanced Practice Providers on your designated Care Team:   Nicolasa Ducking, NP Eula Listen, PA-C Cadence Fransico [name], PA-C Charlsie Quest, NP Carlos Levering, NP

## 2023-07-05 NOTE — Progress Notes (Signed)
 Cardiology Office Note:  .   Date:  07/05/2023  ID:  Daniel [name], DOB 1990/07/29, MRN [id number] PCP: Daniel Shuck, NP  [facility] Providers Cardiologist:  Daniel Hanson, MD Cardiology APP:  Daniel [name] LABOR, FNP     History of Present Illness: Daniel [name] is a 33 y.o. male with history of cardiomyopathy with low normal to mildly reduced LVEF, persistent atrial fibrillation, questionable TIA, hypertension, hyperlipidemia, sleep disordered breathing, chronic respiratory failure with hypoxia, seizure disorder, conversion disorder, and schizoaffective disorder, presents for follow-up of cardiomyopathy and atrial fibrillation.  He was last seen in our office by Daniel Lunch, NP, in 02/2023.  He has followed primarily with the advanced heart failure clinic over the last 2 years.  He was feeling fairly well and noted to be in sinus rhythm at his visit with her.  He was seen in the advanced heart failure clinic a month ago and was feeling fairly well.  His weight was gradually trending down.  He was using BiPAP nightly.  He was felt to be euvolemic at that time.  No medication changes were made.  Today, Daniel [name] reports that he has been feeling well.  He happily reports that he has continued to lose weight through a combination of diet and exercise.  He predominantly lifts weights but also does some walking.  He sometimes notes chest wall soreness after lifting weights but does not have any exertional chest pain.  He has rare asthma attacks but otherwise has not felt short of breath.  He has minimal palpitations, usually only lasting a few seconds.  He has minimal leg edema that he controls with compression stockings.  He is using BiPAP regularly.  ROS: See HPI  Studies Reviewed: SABRA   ECG (07/05/2023): Normal sinus rhythm with mildly prolonged QT.  No significant change from prior tracing on 05/24/2023.  Risk Assessment/Calculations:    CHA2DS2-VASc Score = 5   This  indicates a 7.2% annual risk of stroke. The patient's score is based upon: CHF History: 1 HTN History: 1 Diabetes History: 1 Stroke History: 2 Vascular Disease History: 0 Age Score: 0 Gender Score: 0            Physical Exam:   VS:  BP 110/69 (BP Location: Left Arm, Patient Position: Sitting, Cuff Size: Normal)   Pulse 70   Ht 6' 2 (1.88 m)   Wt (!) 349 lb (158.3 kg)   SpO2 96%   BMI 44.81 kg/m    Wt Readings from Last 3 Encounters:  07/05/23 (!) 349 lb (158.3 kg)  06/29/23 (!) 355 lb 3.2 oz (161.1 kg)  06/06/23 (!) 355 lb (161 kg)    General:  NAD.  Accompanied by caregiver. Neck: Difficult to assess JVP due to body habitus. Lungs: Clear to auscultation bilaterally without wheezes or crackles. Heart: Regular rate and rhythm without murmurs, rubs, or gallops. Abdomen: Soft, nontender, nondistended. Extremities: No lower extremity edema.  ASSESSMENT AND PLAN: .    Chronic heart failure with preserved ejection fraction: Daniel [name] appears grossly euvolemic on exam with NYHA class II symptoms.  Last echocardiogram in 07/2022 showed normal LVEF.  Subsequent right heart catheterization demonstrated low normal filling pressures and normal cardiac output.  Continue his current GDMT consisting of metoprolol  succinate, Entresto , dapagliflozin , and spironolactone .  Okay to continue current dose of furosemide  as well.  Continue scheduled follow-up with Dr. Rolan in the heart failure clinic in July.  Persistent atrial fibrillation: Other than rare,  brief palpitations, Daniel [name] has not had any symptoms to suggest recurrent atrial fibrillation.  He remains in sinus rhythm today.  Mild QT prolongation again noted on his EKG, similar to prior tracings.  In the setting of a CHA2DS2-VASc score of at least 5, we will continue with indefinite anticoagulation with apixaban .  Continue diltiazem  and metoprolol  for rate control as well.  Hypertension: Blood pressure well-controlled today.   Continue current regimen of diltiazem , metoprolol  succinate, Entresto , and spironolactone .  Hyperlipidemia: Continue high intensity statin therapy for secondary prevention in the setting of TIA.  Morbid obesity: BMI remains greater than 40 with multiple comorbidities though weight is trending down.  Continue dulaglutide  at the direction of heart failure team as well as ongoing diet and exercise.    Dispo: Return to clinic in 1 year (follow-up with heart failure clinic as previously arranged around 11/2023).  Signed, Daniel Hanson, MD

## 2023-07-06 ENCOUNTER — Ambulatory Visit: Payer: MEDICAID | Admitting: Dietician

## 2023-07-10 ENCOUNTER — Telehealth: Payer: Self-pay

## 2023-07-10 NOTE — Telephone Encounter (Signed)
 We received a fax from [facility]. Breo is not on their formulary. Preferred medications are Dulera, Symbicort , Advair or Advair HFA.

## 2023-07-10 NOTE — Telephone Encounter (Signed)
 I sent Symbicort  in October, which was after his last visit. He hasn't been seen back since and I sent him to the ED at his last visit. Needs to establish with one of the pulm MDs because he's only ever seen the APPs for sleep. Thanks!

## 2023-07-14 NOTE — Telephone Encounter (Signed)
I spoke with the patient's caregiver [facility]) and scheduled him an appt with Dr. Belia Heman on 3/3 at 8:45am.  Nothing further needed.

## 2023-07-31 ENCOUNTER — Encounter: Payer: MEDICAID | Admitting: Internal Medicine

## 2023-07-31 ENCOUNTER — Telehealth: Payer: Self-pay

## 2023-07-31 ENCOUNTER — Encounter: Payer: Self-pay | Admitting: Internal Medicine

## 2023-07-31 NOTE — Progress Notes (Signed)
 This encounter was created in error - please disregard.

## 2023-07-31 NOTE — Telephone Encounter (Signed)
 Received fax from [facility] for diagnosis verification and length of need for home oxygen orders.  [name], FNP, reviewed and requested to forward fax to pt's pulmonologist. Pt has pulmonology appt today. Faxed successfully to [facility].

## 2023-08-02 ENCOUNTER — Telehealth: Payer: Self-pay | Admitting: Primary Care

## 2023-08-02 NOTE — Telephone Encounter (Signed)
 Faxed back 08/02/23 confirmation received

## 2023-08-09 ENCOUNTER — Emergency Department: Payer: MEDICAID

## 2023-08-09 ENCOUNTER — Other Ambulatory Visit: Payer: Self-pay

## 2023-08-09 DIAGNOSIS — I10 Essential (primary) hypertension: Secondary | ICD-10-CM | POA: Insufficient documentation

## 2023-08-09 DIAGNOSIS — E119 Type 2 diabetes mellitus without complications: Secondary | ICD-10-CM | POA: Insufficient documentation

## 2023-08-09 DIAGNOSIS — I509 Heart failure, unspecified: Secondary | ICD-10-CM | POA: Diagnosis not present

## 2023-08-09 DIAGNOSIS — R42 Dizziness and giddiness: Secondary | ICD-10-CM | POA: Diagnosis present

## 2023-08-09 LAB — BASIC METABOLIC PANEL
Anion gap: 6 (ref 5–15)
BUN: 18 mg/dL (ref 6–20)
CO2: 27 mmol/L (ref 22–32)
Calcium: 9 mg/dL (ref 8.9–10.3)
Chloride: 105 mmol/L (ref 98–111)
Creatinine, Ser: 0.78 mg/dL (ref 0.61–1.24)
GFR, Estimated: >60 mL/min (ref >60)
Glucose, Bld: 113 mg/dL — ABNORMAL HIGH (ref 70–99)
Potassium: 4 mmol/L (ref 3.5–5.1)
Sodium: 138 mmol/L (ref 135–145)

## 2023-08-09 LAB — CBC
HCT: 39.9 % (ref 39.0–52.0)
Hemoglobin: 12.9 g/dL — ABNORMAL LOW (ref 13.0–17.0)
MCH: 29.1 pg (ref 26.0–34.0)
MCHC: 32.3 g/dL (ref 30.0–36.0)
MCV: 90.1 fL (ref 80.0–100.0)
Platelets: 219 10*3/uL (ref 150–400)
RBC: 4.43 MIL/uL (ref 4.22–5.81)
RDW: 12.9 % (ref 11.5–15.5)
WBC: 7 10*3/uL (ref 4.0–10.5)
nRBC: 0 % (ref 0.0–0.2)

## 2023-08-09 LAB — TROPONIN I (HIGH SENSITIVITY): Troponin I (High Sensitivity): 3 ng/L (ref <18)

## 2023-08-09 LAB — BRAIN NATRIURETIC PEPTIDE: B Natriuretic Peptide: 180.7 pg/mL — ABNORMAL HIGH (ref 0.0–100.0)

## 2023-08-09 NOTE — ED Triage Notes (Signed)
 Pt to ED via EMS from home, pt began to have chest pain tonight. Pt has hx CHF MI over 10 years ago and hx a fib. Pt reports feeling dizzy and has lower extremity edema.

## 2023-08-10 ENCOUNTER — Emergency Department
Admission: EM | Admit: 2023-08-10 | Discharge: 2023-08-10 | Disposition: A | Discharge status: Home / Self Care | Payer: MEDICAID | Attending: Emergency Medicine | Admitting: Emergency Medicine

## 2023-08-10 DIAGNOSIS — R079 Chest pain, unspecified: Secondary | ICD-10-CM

## 2023-08-10 LAB — RESP PANEL BY RT-PCR (RSV, FLU A&B, COVID)  RVPGX2
Influenza A by PCR: NEGATIVE
Influenza B by PCR: NEGATIVE
Resp Syncytial Virus by PCR: NEGATIVE
SARS Coronavirus 2 by RT PCR: NEGATIVE

## 2023-08-10 LAB — URINALYSIS, COMPLETE (UACMP) WITH MICROSCOPIC
Bacteria, UA: NONE SEEN
Bilirubin Urine: NEGATIVE
Glucose, UA: >=500 mg/dL — AB
Hgb urine dipstick: NEGATIVE
Ketones, ur: NEGATIVE mg/dL
Nitrite: NEGATIVE
Protein, ur: NEGATIVE mg/dL
Specific Gravity, Urine: 1.016 (ref 1.005–1.030)
pH: 6 (ref 5.0–8.0)

## 2023-08-10 LAB — TROPONIN I (HIGH SENSITIVITY): Troponin I (High Sensitivity): 3 ng/L (ref <18)

## 2023-08-10 MED ORDER — ALUM & MAG HYDROXIDE-SIMETH 200-200-20 MG/5ML PO SUSP
30.0000 mL | Freq: Once | ORAL | Status: AC
Start: 2023-08-10 — End: 2023-08-10
  Administered 2023-08-10: 30 mL via ORAL
  Filled 2023-08-10: qty 30

## 2023-08-10 MED ORDER — LIDOCAINE VISCOUS HCL 2 % MT SOLN
15.0000 mL | Freq: Once | OROMUCOSAL | Status: AC
Start: 2023-08-10 — End: 2023-08-10
  Administered 2023-08-10: 15 mL via ORAL
  Filled 2023-08-10: qty 15

## 2023-08-10 NOTE — ED Notes (Signed)
 GI cocktail helped with patient's chest discomfort. Requesting food.

## 2023-08-10 NOTE — Discharge Instructions (Addendum)
 As we discussed please increase your Lasix from 20 mg daily to 40 mg daily for the next 5 days.  Please follow-up with your doctor within the next 1 to 2 days for recheck/reevaluation.  Return to the emergency department for any chest pain, trouble breathing, or any other symptom personally concerning to yourself.

## 2023-08-10 NOTE — ED Notes (Signed)
 Voicemails left for Legal Guardian Florencia Reasons, Kathlene November and Jodi Geralds.

## 2023-08-10 NOTE — ED Notes (Signed)
 Voicemail left on John's phone for ride.

## 2023-08-10 NOTE — ED Notes (Signed)
 Report given to Jodi Geralds (group home manager) also to group home caretaker that picked up patient. Attempted to call legual guardian without success unable to leave a message.

## 2023-08-10 NOTE — ED Notes (Signed)
 Voicemails left for Daniel [name], and Daniel [name] with no answer. Voicemail box full for Daniel [name].

## 2023-08-10 NOTE — ED Provider Notes (Signed)
 [facility] Provider Note    Event Date/Time   First MD Initiated Contact with Patient 08/10/23 0308     (approximate)  History   Chief Complaint: Chest Pain  HPI  M[place] Hijazi is a 33 y.o. male with a past medical history of cardiomyopathy/CHF, gastric reflux, hypertension, hyperlipidemia, schizophrenia, diabetes, presents to the emergency department with multiple complaints.  According to the patient yesterday he states he was sitting at home when he began feeling somewhat dizzy as well as really hot.  He is not sure if he had a fever or not.  Patient also states earlier tonight he was experiencing some chest discomfort he was concerned so he came to the emergency department for evaluation.  Patient states he just finished a course of antibiotics for urinary tract infection and tonight when he urinated he had a slight burning sensation.  Physical Exam   Triage Vital Signs: ED Triage Vitals  Encounter Vitals Group     BP 08/09/23 2233 125/73     Systolic BP Percentile --      Diastolic BP Percentile --      Pulse Rate 08/09/23 2233 80     Resp 08/09/23 2233 20     Temp 08/09/23 2233 98.8 F (37.1 C)     Temp Source 08/10/23 0037 Oral     SpO2 08/09/23 2233 96 %     Weight 08/09/23 2231 (!) 350 lb (158.8 kg)     Height 08/09/23 2231 6\' 5"  (1.956 m)     Head Circumference --      Peak Flow --      Pain Score 08/09/23 2231 10     Pain Loc --      Pain Education --      Exclude from Growth Chart --     Most recent vital signs: Vitals:   08/09/23 2233 08/10/23 0037  BP: 125/73 100/75  Pulse: 80 75  Resp: 20 18  Temp: 98.8 F (37.1 C) 98.7 F (37.1 C)  SpO2: 96% 97%    General: Awake, no distress.  CV:  Good peripheral perfusion.  Regular rate and rhythm Resp:  Normal effort.  Equal breath sounds bilaterally.  No wheeze rales or rhonchi. Abd:  No distention.  Soft, nontender.  No rebound or guarding. Other:  Minimal lower extremity  edema.  ED Results / Procedures / Treatments   EKG  EKG viewed and interpreted by myself shows a normal sinus rhythm at 79 bpm with a narrow QRS, normal axis, normal intervals, no concerning ST changes.  RADIOLOGY  I have reviewed and interpreted chest x-ray images.  No consolidation or fluid seen on my evaluation. Radiology is read the x-ray is negative   MEDICATIONS ORDERED IN ED: Medications - No data to display   IMPRESSION / MDM / ASSESSMENT AND PLAN / ED COURSE  I reviewed the triage vital signs and the nursing notes.  Patient's presentation is most consistent with acute presentation with potential threat to life or bodily function.  Patient presents emergency department for complaints of chest pain earlier today, states yesterday began experiencing a hot sensation thought he could have a fever, also states mild burning upon urination.  Patient's workup shows no significant findings.  CBC is reassuring, chemistry reassuring, troponin negative x 2.  BNP is mildly elevated around 180 with a baseline closer to 80.  Reassuringly chest x-ray is clear and patient satting in the upper 90s on room air.  Given the  patient's complaints of possible fever/chills yesterday we will obtain a COVID/flu swab as a precaution.  Given the patient's complaint of mild dysuria although just completed a course of antibiotics we will check a urinalysis as a precaution.  As long as the remainder of the patient's workup shows no significant findings anticipate likely discharge home increasing the patient's Lasix from 20 mg to 40 mg for the next 5 days at home.  Patient agreeable to plan of care.  Patient's workup is reassuring, respiratory panel is negative, urinalysis is normal.  Discussed with the patient increasing his Lasix from 20 mg to 40 mg daily for next 5 days.  Patient agreeable to plan of care and will follow-up with his doctor.  Patient currently eating a meal tray, no distress.  FINAL CLINICAL  IMPRESSION(S) / ED DIAGNOSES   CHF    Note:  This document was prepared using Dragon voice recognition software and may include unintentional dictation errors.   Minna Antis, MD 08/10/23 0530

## 2023-08-14 ENCOUNTER — Other Ambulatory Visit: Payer: Self-pay | Admitting: Family

## 2023-08-24 ENCOUNTER — Encounter: Payer: MEDICAID | Attending: Family | Admitting: Dietician

## 2023-08-24 ENCOUNTER — Encounter: Payer: Self-pay | Admitting: Dietician

## 2023-08-24 DIAGNOSIS — I5032 Chronic diastolic (congestive) heart failure: Secondary | ICD-10-CM | POA: Diagnosis present

## 2023-08-24 DIAGNOSIS — I10 Essential (primary) hypertension: Secondary | ICD-10-CM | POA: Diagnosis present

## 2023-08-24 NOTE — Patient Instructions (Signed)
 Exercise most days each week, even if just a few minutes. Put a star sticker on a calendar each day you exercise.  Try 1/2 sweet and 1/2 unsweet tea when eating out. Also eat about 1/2 of the fries when eating out, or try a salad as a side instead of fries.  When you buy canned foods, choose the low sodium version.

## 2023-08-24 NOTE — Progress Notes (Signed)
 Medical Nutrition Therapy: Visit start time: 1100  end time: 1130  Assessment:  Diagnosis: obesity Medical history changes: no changes Psychosocial issues/ stress concerns: schizoaffective disorder, bipolar   Medications, supplement changes: reconciled list in medical record   Current weight: 348.9lbs Height: 6'3.5"  BMI: 43  Progress and evaluation:  Weight loss of 6-7lbs since previous visit on 06/29/23 Reports less exercise recently; caregiver is encouraging patient to increase gradually. Patient reports earlier satiety when eating due to Trulicity; has had some episodes of diarrhea (1x vomiting) when overeating.    Dietary Intake:  Usual eating pattern includes 3 meals and 1-2 snacks per day. Dining out frequency: 2-3 meals per week.  Breakfast: 2 lg pancakes with light syrup, sm amt margarine + yogurt; eggs bologna, toast with jelly Snack: fruit or yogurt Lunch: premade salad from grocery Snack: apple/ grapes; yogurt Supper: voila frozen bagged meal; some restaurant meals [place] cheesesteak sub, fries; [employer] sandwich, sweet potato fries, sm cup ice cream Snack: none or same as pm  Beverages: water; sweet tea when out (weekends)  Physical activity: occasional walk/ weights; plans to go to [facility] today  Intervention:   Nutrition Care Education:  Basic nutrition: reviewed appropriate nutrient balance; general nutrition guidelines    Weight control: reviewed progress since previous visit; low fat and low sugar food and beverage choices; physical activity -- starting with short duration and gradually increasing to develop long term habit, tracking activity and implementing (non-food) reward system Diabetes: advised and discussed smaller portions of starches, sugar sweetened beverages CHF: low sodium food choices  Other Intervention Notes: Weight loss continues steadily; commended patient Primary goal is increasing physical activity gradually   Nutritional  Diagnosis:  [place]-2.1 Inpaired nutrition utilization and Micro-2.2 Altered nutrition-related laboratory As related to CHF, diabetes.  As evidenced by BP and heart rhythm as well as blood lipids controlled with medications . -3.3 Overweight/obesity As related to excess calories and inadequate physical activity.  As evidenced by patient with current BMI of 43.81, in weigh loss pattern with help of GLP-1 medication.   Education Materials given:  Carb-mindful smoothie recipe template Visit summary with goals/ instructions   Learner/ who was taught:  Patient  Caregiver/ guardian  Level of understanding: Understands key concepts   Demonstrated degree of understanding via:   Teach back Learning barriers: None  Willingness to learn/ readiness for change: Eager, change in progress  Monitoring and Evaluation:  Dietary intake, exercise, and body weight      follow up:  10/19/23

## 2023-08-30 ENCOUNTER — Ambulatory Visit: Payer: Self-pay | Admitting: Urology

## 2023-08-31 ENCOUNTER — Ambulatory Visit: Payer: MEDICAID | Admitting: Internal Medicine

## 2023-08-31 ENCOUNTER — Encounter: Payer: Self-pay | Admitting: Internal Medicine

## 2023-08-31 ENCOUNTER — Ambulatory Visit (INDEPENDENT_AMBULATORY_CARE_PROVIDER_SITE_OTHER): Payer: MEDICAID | Admitting: Physician Assistant

## 2023-08-31 VITALS — BP 120/78 | HR 79 | Ht 75.5 in | Wt 361.0 lb

## 2023-08-31 VITALS — BP 111/70 | HR 98

## 2023-08-31 DIAGNOSIS — Z87448 Personal history of other diseases of urinary system: Secondary | ICD-10-CM

## 2023-08-31 DIAGNOSIS — M545 Low back pain, unspecified: Secondary | ICD-10-CM | POA: Diagnosis not present

## 2023-08-31 DIAGNOSIS — J4530 Mild persistent asthma, uncomplicated: Secondary | ICD-10-CM

## 2023-08-31 DIAGNOSIS — M170 Bilateral primary osteoarthritis of knee: Secondary | ICD-10-CM | POA: Insufficient documentation

## 2023-08-31 DIAGNOSIS — J4520 Mild intermittent asthma, uncomplicated: Secondary | ICD-10-CM | POA: Diagnosis not present

## 2023-08-31 DIAGNOSIS — R300 Dysuria: Secondary | ICD-10-CM

## 2023-08-31 DIAGNOSIS — G4733 Obstructive sleep apnea (adult) (pediatric): Secondary | ICD-10-CM

## 2023-08-31 DIAGNOSIS — G8929 Other chronic pain: Secondary | ICD-10-CM | POA: Diagnosis not present

## 2023-08-31 LAB — MICROSCOPIC EXAMINATION: Epithelial Cells (non renal): >10 /HPF — AB (ref 0–10)

## 2023-08-31 LAB — URINALYSIS, COMPLETE
Bilirubin, UA: NEGATIVE
Ketones, UA: NEGATIVE
Nitrite, UA: NEGATIVE
Protein,UA: NEGATIVE
RBC, UA: NEGATIVE
Specific Gravity, UA: 1.01 (ref 1.005–1.030)
Urobilinogen, Ur: 0.2 mg/dL (ref 0.2–1.0)
pH, UA: 6.5 (ref 5.0–7.5)

## 2023-08-31 LAB — BLADDER SCAN AMB NON-IMAGING: Scan Result: 441

## 2023-08-31 MED ORDER — FLUTICASONE PROPIONATE 50 MCG/ACT NA SUSP: 2.0000 | Freq: Two times a day (BID) | NASAL | 10 refills | Status: AC

## 2023-08-31 MED ORDER — BUDESONIDE-FORMOTEROL FUMARATE 160-4.5 MCG/ACT IN AERO: 2.0000 | INHALATION_SPRAY | Freq: Two times a day (BID) | RESPIRATORY_TRACT | 6 refills | Status: DC

## 2023-08-31 MED ORDER — ALBUTEROL SULFATE HFA 108 (90 BASE) MCG/ACT IN AERS
2.0000 | INHALATION_SPRAY | Freq: Four times a day (QID) | RESPIRATORY_TRACT | 2 refills | Status: DC | PRN
Start: 2023-08-31 — End: 2023-10-16

## 2023-08-31 NOTE — Progress Notes (Signed)
 08/31/2023 11:22 AM   Daniel [name] 03-24-1991 [id number]  CC: Chief Complaint  Patient presents with   Dysuria   Urethral Stricture   HPI: Daniel [name] is a 33 y.o. male with PMH recurrent bulbar urethral stricture s/p dilation in 2023 and Optilume dilation in April 2024 with no clinically significant recurrence seen on cystoscopy in October 2024, possible recurrent UTIs versus contaminated urine samples, chronic A-fib on Eliquis, CHF, schizoaffective disorder, diabetes on Farxiga, and conversion disorder with frequent ED visits who presents today for 36-month follow-up.   Today he reports worsening dysuria and straining to void since his last cystoscopy 6 months ago.  He was given antibiotics 2 weeks ago at [facility] urgent care for a possible prostate infection.  He continues to complain of low back pain that is radiating to his lower abdominal quadrants.  He already sees [facility] for his knees.  In-office UA today positive for plus glucose and trace leukocytes; urine microscopy with >10 epithelial cells/hpf. PVR . He was prompted to void and was able to empty .  PMH: Past Medical History:  Diagnosis Date   Cardiomyopathy (HCC)    a.) TTE 08/04/2021: EF 50%; b.) TTE 10/22/2021: EF 45-50-%; c.) TTE 08/22/2022: EF 55-60%   Chest pain    CHF (congestive heart failure) (HCC)    a.) TTE 08/04/2021: EF 50%, mild LVH, RVSF norm; b.) TTE 10/22/2021: EF 45-50%, glob HK, RVSF norm; c.) TTE 08/22/2022: EF 55-60%, mild dil LV, RVSF norm.   Conversion disorder    Current use of long term anticoagulation    Dyspnea    GERD (gastroesophageal reflux disease)    Hepatic steatosis    Hyperlipidemia    Hypertension    Insomnia    a.) melatonin PRN   Long term current use of anticoagulant    a.) apixaban   OSA on CPAP    Persistent atrial fibrillation and flutter (HCC)    a.) CHA2DS2VASc = 5 (CHF, HTN, TIA x 2, T2DM);  b.) rate/rhythm maintained on oral diltiazem +  metoprolol succinate; chronically anticoagulated with apixaban   Schizoaffective disorder (HCC)    Seizure disorder (HCC)    T2DM (type 2 diabetes mellitus) (HCC)    TIA (transient ischemic attack)    Urethral stricture    a. 08/2021 s/p cystoscopy and urethral dilation.    Surgical History: Past Surgical History:  Procedure Laterality Date   CYSTOSCOPY WITH URETHRAL DILATATION N/A 09/07/2021   Procedure: CYSTOSCOPY WITH URETHRAL DILATATION CATHETER PLACEMENT;  Surgeon: Riki Altes, MD;  Location: [facility] ORS;  Service: Urology;  Laterality: N/A;   CYSTOSCOPY WITH URETHRAL DILATATION N/A 09/06/2022   Procedure: CYSTOSCOPY WITH URETHRAL BALLOON DILATATION USING OPTILUME;  Surgeon: Riki Altes, MD;  Location: [facility] ORS;  Service: Urology;  Laterality: N/A;   RIGHT HEART CATH Right 10/07/2022   Procedure: RIGHT HEART CATH;  Surgeon: Laurey Morale, MD;  Location: [facility] INVASIVE CV LAB;  Service: Cardiovascular;  Laterality: Right;    Home Medications:  Allergies as of 08/31/2023       Reactions   Haldol [haloperidol] Other (See Comments)   SI   Semaglutide Anxiety   Increase in suicidal thoughts   Dermatitis Antigen Rash   Meperidine    Meperidine   Abilify [aripiprazole] Palpitations   Demerol [meperidine Hcl] Hives   Tape Rash   Other reaction(s): Other (See Comments) Itching, skin tear        Medication List  Accurate as of August 31, 2023 11:22 AM. If you have any questions, ask your nurse or doctor.          acetaminophen 325 MG tablet Commonly known as: Tylenol Take 2 tablets (650 mg total) by mouth every 4 (four) hours as needed.   albuterol 108 (90 Base) MCG/ACT inhaler Commonly known as: VENTOLIN HFA Inhale 2 puffs into the lungs every 6 (six) hours as needed for wheezing.   allopurinol 300 MG tablet Commonly known as: ZYLOPRIM Take 1 tablet (300 mg total) by mouth daily.   atorvastatin 80 MG tablet Commonly known as: LIPITOR Take 1 tablet (80  mg total) by mouth daily.   baclofen 10 MG tablet Commonly known as: LIORESAL Take 1 tablet (10 mg total) by mouth 2 (two) times daily as needed for muscle spasms. Home med. What changed: when to take this   benztropine 1 MG tablet Commonly known as: COGENTIN Take 1 mg by mouth daily.   budesonide-formoterol 160-4.5 MCG/ACT inhaler Commonly known as: SYMBICORT Inhale 2 puffs into the lungs 2 (two) times daily.   CRANBERRY EXTRACT PO Take by mouth.   cyclobenzaprine 5 MG tablet Commonly known as: FLEXERIL Take 5 mg by mouth 3 (three) times daily as needed for muscle spasms.   dapagliflozin propanediol 10 MG Tabs tablet Commonly known as: Farxiga Take 1 tablet (10 mg total) by mouth daily before breakfast.   diltiazem 360 MG 24 hr capsule Commonly known as: CARDIZEM CD @ TAKE 1 CAPSULE BY MOUTH ONCE DAILY *DO NOT CRUSH OR CHEW*   divalproex 500 MG DR tablet Commonly known as: DEPAKOTE Take 2 tablets (1,000 mg total) by mouth 2 (two) times daily. What changed:  when to take this additional instructions   Eliquis 5 MG Tabs tablet Generic drug: apixaban @ TAKE 1 TABLET BY MOUTH TWICE DAILY   Entresto 24-26 MG Generic drug: sacubitril-valsartan TAKE 1 TABLET BY MOUTH TWICE DAILY   escitalopram 10 MG tablet Commonly known as: LEXAPRO Take 20 mg by mouth daily.   fluticasone 50 MCG/ACT nasal spray Commonly known as: FLONASE Place 2 sprays into both nostrils 2 (two) times daily.   furosemide 20 MG tablet Commonly known as: Lasix Take 1 tablet (20 mg total) by mouth daily.   hydrOXYzine 50 MG tablet Commonly known as: ATARAX Take 50 mg by mouth in the morning and at bedtime.   hydrOXYzine 25 MG tablet Commonly known as: ATARAX Take 25 mg by mouth daily. At noon   Invega 9 MG 24 hr tablet Generic drug: paliperidone Take 9 mg by mouth every morning.   lithium carbonate 300 MG ER tablet Commonly known as: LITHOBID Take 900 mg by mouth every evening. 1 tab  QAM, 2 tab QPM   loratadine 10 MG tablet Commonly known as: CLARITIN Take 10 mg by mouth daily.   melatonin 3 MG Tabs tablet Take 3 mg by mouth at bedtime.   metoprolol succinate 100 MG 24 hr tablet Commonly known as: TOPROL-XL TAKE 1 AND 1/2 TABLET (150MG ) BY MOUTH ONCE DAILY  WITH OR IMMEDIATELY FOLLOWING A MEAL.   MULTIVITAMIN GUMMIES ADULT PO Take by mouth.   mupirocin ointment 2 % Commonly known as: BACTROBAN SMARTSIG:1 Application Topical 2-3 Times Daily   omeprazole 20 MG capsule Commonly known as: PRILOSEC Take 20 mg by mouth daily.   ondansetron 4 MG disintegrating tablet Commonly known as: ZOFRAN-ODT Take 1 tablet (4 mg total) by mouth every 8 (eight) hours as needed.   propranolol  10 MG tablet Commonly known as: INDERAL Take 10 mg by mouth 2 (two) times daily.   sodium chloride 0.65 % Soln nasal spray Commonly known as: OCEAN Place 1 spray into both nostrils as needed for congestion.   spironolactone 25 MG tablet Commonly known as: ALDACTONE Take 0.5 tablets (12.5 mg total) by mouth daily.   sulfamethoxazole-trimethoprim 800-160 MG tablet Commonly known as: BACTRIM DS Take 1 tablet by mouth 2 (two) times daily.   tamsulosin 0.4 MG Caps capsule Commonly known as: FLOMAX Take 1 capsule (0.4 mg total) by mouth daily.   Trulicity 3 MG/0.5ML Soaj Generic drug: Dulaglutide Inject 3 mg as directed once a week.        Allergies:  Allergies  Allergen Reactions   Haldol [Haloperidol] Other (See Comments)    SI   Semaglutide Anxiety    Increase in suicidal thoughts   Dermatitis Antigen Rash   Meperidine     Meperidine   Abilify [Aripiprazole] Palpitations   Demerol [Meperidine Hcl] Hives   Tape Rash    Other reaction(s): Other (See Comments) Itching, skin tear    Family History: Family History  Problem Relation Age of Onset   Colon cancer Father    Breast cancer Paternal Grandmother     Social History:   reports that he quit smoking  about 2 years ago. His smoking use included cigarettes. He started smoking about 4 years ago. He has never used smokeless tobacco. He reports that he does not currently use alcohol. He reports that he does not use drugs.  Physical Exam: There were no vitals taken for this visit.  Constitutional:  Alert and oriented, no acute distress, nontoxic appearing HEENT: [place], AT Cardiovascular: No clubbing, cyanosis, or edema Respiratory: Normal respiratory effort, no increased work of breathing Skin: No rashes, bruises or suspicious lesions Neurologic: Grossly intact, no focal deficits, moving all 4 extremities Psychiatric: Normal mood and affect  Laboratory Data: Results for orders placed or performed in visit on 08/31/23  Microscopic Examination   Collection Time: 08/31/23 11:15 AM   Urine  Result Value Ref Range   WBC, UA 0-5 0 - 5 /hpf   RBC, Urine 0-2 0 - 2 /hpf   Epithelial Cells (non renal) >10 (A) 0 - 10 /hpf   Mucus, UA Present (A) Not Estab.   Bacteria, UA Few None seen/Few  Urinalysis, Complete   Collection Time: 08/31/23 11:15 AM  Result Value Ref Range   Specific Gravity, UA 1.010 1.005 - 1.030   pH, UA 6.5 5.0 - 7.5   Color, UA Yellow Yellow   Appearance Ur Clear Clear   Leukocytes,UA Trace (A) Negative   Protein,UA Negative Negative/Trace   Glucose, UA 3+ (A) Negative   Ketones, UA Negative Negative   RBC, UA Negative Negative   Bilirubin, UA Negative Negative   Urobilinogen, Ur 0.2 0.2 - 1.0 mg/dL   Nitrite, UA Negative Negative   Microscopic Examination See below:   BLADDER SCAN AMB NON-IMAGING   Collection Time: 08/31/23 11:33 AM  Result Value Ref Range   Scan Result 441 ml    Assessment & Plan:   1. History of urethral stricture (Primary) UA appears bland, though contaminated.  He is able to empty appropriately when prompted.  Low suspicion for recurrent stricture, however he does report worsening straining and dysuria over the past 6 months, so we will schedule  him for cystoscopy.  We discussed that if he has had recurrence of his stricture, we have nothing  else to offer him at this practice and we will need to refer him to a tertiary care center for reconstructive surgery. - Urinalysis, Complete - BLADDER SCAN AMB NON-IMAGING  2. Chronic bilateral low back pain, unspecified whether sciatica present Chronic low back pain radiating to the lower abdominal quadrants consistent with radicular back pain.  We discussed that this is not a urologic problem and I encouraged him to follow-up with orthopedics.  Will place a referral, though we discussed that he can just make an appointment as an existing patient. - AMB referral to orthopedics   Return in about 4 weeks (around 09/28/2023) for Cystoscopy with Dr. Lonna Cobb.  Carman Ching, PA-C  [facility] [place] [address], [address] [place], [place] [address] [phone]

## 2023-08-31 NOTE — Patient Instructions (Addendum)
 Continue BiPAP as prescribed Great job on using it every day!  Refill Symbicort Refill albuterol Refill Flonase  Recommend using saline and Nettie pot with warm water prior to using Flonase  Avoid Allergens and Irritants Avoid secondhand smoke Avoid SICK contacts Recommend  Masking  when appropriate Recommend Keep up-to-date with vaccinations

## 2023-08-31 NOTE — Progress Notes (Addendum)
 @Patient  ID: Daniel [name], male    DOB: 02-05-1991, 33 y.o.   MRN: [id number]   Synopsis 33 year old male, former smoker followed for severe OSA, chronic hypoxic respiratory failure.   Past medical history significant for A-fib RVR, cardiomyopathy, CHF, hypertension, DM 2, obesity, schizoaffective/bipolar disorder, history of SI, anxiety.   Chief complaint Follow-up assessment for sleep apnea Follow-up assessment for shortness of breath and wheezing   HPI Assessment of severe sleep apnea Severe sleep apnea AHI 143 Continue BiPAP as prescribed Unable to obtain download Patient reports that he wears it and needs it every day [facility] as his DME Recommend obtaining download Compliance report reviewed in detail BiPAP 24/12 Pressure support pressure for 100% compliance AHI reduced to 0.5 Patient uses and benefits from therapy Using CPAP nightly and with naps Pressure setting is comfortable and is sleeping well.   Assessment chronic shortness of breath Reactive airways disease consistent with asthma Patient has been using Symbicort  as needed however use Symbicort  this morning due to pollen Non-smoker nonalcoholic Patient advised to avoid triggers  Assessment of chronic hypoxic respiratory failure Uses oxygen at nighttime  No exacerbation at this time No evidence of heart failure at this time No evidence or signs of infection at this time No respiratory distress No fevers, chills, nausea, vomiting, diarrhea No evidence of lower extremity edema No evidence hemoptysis  History of chronic systolic heart failure Patient is followed in the heart failure clinic No evidence of exacerbation at this time Patient continues to take anticoagulation therapy    Allergies  Allergen Reactions   Haldol [Haloperidol] Other (See Comments)    SI   Semaglutide  Anxiety    Increase in suicidal thoughts   Dermatitis Antigen Rash   Meperidine     Meperidine   Abilify  [Aripiprazole] Palpitations   Demerol [Meperidine Hcl] Hives   Tape Rash    Other reaction(s): Other (See Comments) Itching, skin tear    Immunization History  Administered Date(s) Administered   Hepatitis A, Ped/Adol-2 Dose 12/22/2005, 07/31/2006   Hepatitis B, PED/ADOLESCENT 01/23/2002, 02/26/2002, 07/23/2002   Influenza Split 05/30/2015   Influenza, High Dose Seasonal PF 04/06/2006, 03/09/2009, 02/16/2020, 04/14/2020   Influenza,inj,Quad PF,6+ Mos 04/05/2006, 03/09/2009, 04/14/2020   PFIZER Comirnaty(Gray Top)Covid-19 Tri-Sucrose Vaccine 09/30/2020   PFIZER(Purple Top)SARS-COV-2 Vaccination 07/12/2019, 08/08/2019, 04/28/2020   Td (Adult), 2 Lf Tetanus Toxid, Preservative Free 06/14/2002, 04/30/2009   Tdap 02/07/2022   Varicella 12/22/2005, 04/06/2006    Past Medical History:  Diagnosis Date   Cardiomyopathy (HCC)    a.) TTE 08/04/2021: EF 50%; b.) TTE 10/22/2021: EF 45-50-%; c.) TTE 08/22/2022: EF 55-60%   Chest pain    CHF (congestive heart failure) (HCC)    a.) TTE 08/04/2021: EF 50%, mild LVH, RVSF norm; b.) TTE 10/22/2021: EF 45-50%, glob HK, RVSF norm; c.) TTE 08/22/2022: EF 55-60%, mild dil LV, RVSF norm.   Conversion disorder    Current use of long term anticoagulation    Dyspnea    GERD (gastroesophageal reflux disease)    Hepatic steatosis    Hyperlipidemia    Hypertension    Insomnia    a.) melatonin PRN   Long term current use of anticoagulant    a.) apixaban    OSA on CPAP    Persistent atrial fibrillation and flutter (HCC)    a.) CHA2DS2VASc = 5 (CHF, HTN, TIA x 2, T2DM);  b.) rate/rhythm maintained on oral diltiazem  + metoprolol  succinate; chronically anticoagulated with apixaban    Schizoaffective disorder (HCC)    Seizure  disorder (HCC)    T2DM (type 2 diabetes mellitus) (HCC)    TIA (transient ischemic attack)    Urethral stricture    a. 08/2021 s/p cystoscopy and urethral dilation.    Tobacco History: Social History   Tobacco Use  Smoking Status  Former   Current packs/day: 0.00   Types: Cigarettes   Start date: 08/2019   Quit date: 08/2021   Years since quitting: 2.0  Smokeless Tobacco Never   Counseling given: Not Answered   Outpatient Medications Prior to Visit  Medication Sig Dispense Refill   acetaminophen  (TYLENOL ) 325 MG tablet Take 2 tablets (650 mg total) by mouth every 4 (four) hours as needed. 100 tablet 2   albuterol  (VENTOLIN  HFA) 108 (90 Base) MCG/ACT inhaler Inhale 2 puffs into the lungs every 6 (six) hours as needed for wheezing. 1 each 2   allopurinol  (ZYLOPRIM ) 300 MG tablet Take 1 tablet (300 mg total) by mouth daily. 30 tablet 0   apixaban  (ELIQUIS ) 5 MG TABS tablet @ TAKE 1 TABLET BY MOUTH TWICE DAILY 60 tablet 11   atorvastatin  (LIPITOR ) 80 MG tablet Take 1 tablet (80 mg total) by mouth daily. 30 tablet 10   baclofen  (LIORESAL ) 10 MG tablet Take 1 tablet (10 mg total) by mouth 2 (two) times daily as needed for muscle spasms. Home med. (Patient taking differently: Take 10 mg by mouth 2 (two) times daily. Home med.) 30 each 0   benztropine  (COGENTIN ) 1 MG tablet Take 1 mg by mouth daily.     budesonide -formoterol  (SYMBICORT ) 160-4.5 MCG/ACT inhaler Inhale 2 puffs into the lungs 2 (two) times daily. 1 each 6   CRANBERRY EXTRACT PO Take by mouth.     cyclobenzaprine  (FLEXERIL ) 5 MG tablet Take 5 mg by mouth 3 (three) times daily as needed for muscle spasms.     dapagliflozin  propanediol (FARXIGA ) 10 MG TABS tablet Take 1 tablet (10 mg total) by mouth daily before breakfast. 30 tablet 5   diltiazem  (CARDIZEM  CD) 360 MG 24 hr capsule @ TAKE 1 CAPSULE BY MOUTH ONCE DAILY *DO NOT CRUSH OR CHEW* 30 capsule 5   divalproex  (DEPAKOTE ) 500 MG DR tablet Take 2 tablets (1,000 mg total) by mouth 2 (two) times daily. (Patient taking differently: Take 1,000 mg by mouth daily at 6 (six) AM. 1000mg  QAM)     Dulaglutide  (TRULICITY ) 3 MG/0.5ML SOAJ Inject 3 mg as directed once a week. 2 mL 5   escitalopram  (LEXAPRO ) 10 MG tablet  Take 20 mg by mouth daily.     fluticasone  (FLONASE ) 50 MCG/ACT nasal spray Place 2 sprays into both nostrils 2 (two) times daily.     furosemide  (LASIX ) 20 MG tablet Take 1 tablet (20 mg total) by mouth daily. 30 tablet 11   hydrOXYzine  (ATARAX ) 25 MG tablet Take 25 mg by mouth daily. At noon     INVEGA  9 MG 24 hr tablet Take 9 mg by mouth every morning.     lithium  carbonate (LITHOBID ) 300 MG CR tablet Take 900 mg by mouth every evening. 1 tab QAM, 2 tab QPM     loratadine  (CLARITIN ) 10 MG tablet Take 10 mg by mouth daily.     melatonin 3 MG TABS tablet Take 3 mg by mouth at bedtime.     metoprolol  succinate (TOPROL -XL) 100 MG 24 hr tablet TAKE 1 AND 1/2 TABLET (150MG ) BY MOUTH ONCE DAILY  WITH OR IMMEDIATELY FOLLOWING A MEAL. 42 tablet 9   Multiple Vitamins-Minerals (MULTIVITAMIN GUMMIES ADULT  PO) Take by mouth.     mupirocin ointment (BACTROBAN) 2 % SMARTSIG:1 Application Topical 2-3 Times Daily     omeprazole (PRILOSEC) 20 MG capsule Take 20 mg by mouth daily.     ondansetron  (ZOFRAN -ODT) 4 MG disintegrating tablet Take 1 tablet (4 mg total) by mouth every 8 (eight) hours as needed. 20 tablet 0   propranolol (INDERAL) 10 MG tablet Take 10 mg by mouth 2 (two) times daily.     sacubitril -valsartan  (ENTRESTO ) 24-26 MG TAKE 1 TABLET BY MOUTH TWICE DAILY 60 tablet 11   sodium chloride  (OCEAN) 0.65 % SOLN nasal spray Place 1 spray into both nostrils as needed for congestion. (Patient not taking: Reported on 08/31/2023) 10 mL 5   spironolactone  (ALDACTONE ) 25 MG tablet Take 0.5 tablets (12.5 mg total) by mouth daily. 45 tablet 3   tamsulosin  (FLOMAX ) 0.4 MG CAPS capsule Take 1 capsule (0.4 mg total) by mouth daily. 30 capsule 2   No facility-administered medications prior to visit.     BP 120/78   Pulse 79   Ht 6' 3.5 (1.918 m)   Wt (!) 361 lb (163.7 kg)   SpO2 98%   BMI 44.53 kg/m       Review of Systems: Gen:  Denies  fever, sweats, chills weight loss  HEENT: Denies blurred  vision, double vision, ear pain, eye pain, hearing loss, nose bleeds, sore throat Cardiac:  No dizziness, chest pain or heaviness, chest tightness,edema, No JVD Resp:   No cough, -sputum production, -shortness of breath,-wheezing, -hemoptysis,  Other:  All other systems negative   Physical Examination:   General Appearance: No distress  EYES PERRLA, EOM intact.   NECK Supple, No JVD Pulmonary: normal breath sounds, No wheezing.  CardiovascularNormal S1,S2.  No m/r/g.   Abdomen: Benign, Soft, non-tender. Neurology UE/LE 5/5 strength, no focal deficits Ext pulses intact, cap refill intact ALL OTHER ROS ARE NEGATIVE   Lab Results:  CBC    Component Value Date/Time   WBC 7.0 08/09/2023 2233   RBC 4.43 08/09/2023 2233   HGB 12.9 (L) 08/09/2023 2233   HGB 11.9 (L) 04/29/2013 0838   HCT 39.9 08/09/2023 2233   HCT 36.0 (L) 04/29/2013 0838   PLT 219 08/09/2023 2233   PLT 257 04/29/2013 0838   MCV 90.1 08/09/2023 2233   MCV 80 04/29/2013 0838   MCH 29.1 08/09/2023 2233   MCHC 32.3 08/09/2023 2233   RDW 12.9 08/09/2023 2233   RDW 14.1 04/29/2013 0838   LYMPHSABS 1.8 03/06/2023 1358   MONOABS 0.8 03/06/2023 1358   EOSABS 0.1 03/06/2023 1358   BASOSABS 0.1 03/06/2023 1358    BMET    Component Value Date/Time   NA 138 08/09/2023 2233   NA 136 04/29/2013 0838   K 4.0 08/09/2023 2233   K 4.0 04/29/2013 0838   CL 105 08/09/2023 2233   CL 106 04/29/2013 0838   CO2 27 08/09/2023 2233   CO2 25 04/29/2013 0838   GLUCOSE 113 (H) 08/09/2023 2233   GLUCOSE 105 (H) 04/29/2013 0838   BUN 18 08/09/2023 2233   BUN 15 04/29/2013 0838   CREATININE 0.78 08/09/2023 2233   CREATININE 0.99 04/29/2013 0838   CALCIUM  9.0 08/09/2023 2233   CALCIUM  9.2 04/29/2013 0838   GFRNONAA >60 08/09/2023 2233   GFRNONAA >60 04/29/2013 0838   GFRAA >60 04/29/2013 0838    BNP    Component Value Date/Time   BNP 180.7 (H) 08/09/2023 2233     Imaging:  DG Chest 1 View Result Date:  08/09/2023 CLINICAL DATA:  Chest pain. EXAM: CHEST  1 VIEW COMPARISON:  05/24/2023 FINDINGS: The cardiomediastinal contours are normal. The lungs are clear. Pulmonary vasculature is normal. No consolidation, pleural effusion, or pneumothorax. No acute osseous abnormalities are seen. IMPRESSION: No active disease. Electronically Signed   By: Andrea Gasman M.D.   On: 08/09/2023 23:40    Administration History     None          Latest Ref Rng & Units 07/28/2022    2:05 PM  PFT Results  FVC-Pre L 4.68   FVC-Predicted Pre % 70   Pre FEV1/FVC % % 86   FEV1-Pre L 4.02   FEV1-Predicted Pre % 75   DLCO uncorrected ml/min/mmHg 36.73   DLCO UNC% % 92   DLVA Predicted % 122   TLC L 6.91   TLC % Predicted % 83   RV % Predicted % 130     No results found for: NITRICOXIDE      Assessment & Plan:   33 year old morbidly obese male with diagnosis of severe sleep apnea AHI 143 in the setting of chronic hypoxic respiratory failure with congestive heart failure with recurrent admissions for decompensated heart failure in setting of deconditioned state and respiratory insufficiency  Assessment sleep apnea Patient with severe OSA with AHI 143 Using CPAP nightly.  pressure setting is comfortable and she is sleeping well. auto BiPAP IPAP 24 EPAP 12  Pressure support 4  100% compliance for days  Average usage 4 hours  AHI reduced to 0.5  [facility] is DME  Assessment of Sleep apnea Patient has excellent compliance report Discussed in detail with patient OSA is well-controlled with CPAP Continue current prescription Patient uses and benefits from therapy   Patient Instructions Continue to use CPAP every night, minimum of 4-6 hours a night.  Change equipment every 30 days or as directed by DME.  Wash your tubing with warm soap and water  daily, hang to dry. Wash humidifier portion weekly. Use bottled, distilled water  and change daily   Be aware of reduced alertness and do not drive or  operate heavy machinery if experiencing this or drowsiness.  Exercise encouraged, as tolerated. Encouraged proper weight management.  Important to get eight or more hours of sleep  Limiting the use of the computer and television before bedtime.  Decrease naps during the day, so night time sleep will become enhanced.  Limit caffeine , and sleep deprivation.  HTN, stroke, uncontrolled diabetes and heart failure are potential risk factors.  Risk of untreated sleep apnea including cardiac arrhthymias, stroke, DM, pulm HTN.    Chronic Hypoxic resp failure due to COPD -Patient benefits from oxygen therapy 4L [place]  -recommend using oxygen as prescribed -patient needs this for survival   Assessment of asthma Continue Symbicort  as prescribed and as needed Refill albuterol  Refill Flonase  Recommend use saline and Nettie pot with warm water  prior imaging Flonase  Avoid Allergens and Irritants Avoid secondhand smoke Avoid SICK contacts Recommend  Masking  when appropriate Recommend Keep up-to-date with vaccinations   Congestive heart failure Patient sees cardiology Patient is on Lasix  for chronic lower extremity edema and diastolic heart failure  Cardiology assessment Chronic heart failure with preserved ejection fraction: Mr. Reierson appears grossly euvolemic on exam with NYHA class II symptoms.  Last echocardiogram in 07/2022 showed normal LVEF.  Subsequent right heart catheterization demonstrated low normal filling pressures and normal cardiac output.  Continue his current GDMT consisting of metoprolol  succinate,  Entresto , dapagliflozin , and spironolactone .  Okay to continue current dose of furosemide  as well.  Continue scheduled follow-up with Dr. Rolan in the heart failure clinic in July.  Persistent atrial fibrillation: Other than rare, brief palpitations, Mr. Doolan has not had any symptoms to suggest recurrent atrial fibrillation.  He remains in sinus rhythm today.  Mild QT  prolongation again noted on his EKG, similar to prior tracings.  In the setting of a CHA2DS2-VASc score of at least 5, we will continue with indefinite anticoagulation with apixaban .  Continue diltiazem  and metoprolol  for rate control as well.  Hypertension - Sleep apnea can contribute to hypertension, therefore treatment of sleep apnea is important part of hypertension management.   Atrial Fibrillation - Sleep apnea can contribute to Atrial Fibrillation, therefore treatment of sleep apnea is important part of A fib management.   MEDICATION ADJUSTMENTS/LABS AND TESTS ORDERED: Continue BiPAP as prescribed Great job on using it every day! Refill Symbicort  Refill albuterol  Refill Flonase  Recommend using saline and Nettie pot with warm water  prior to using Flonase     CURRENT MEDICATIONS REVIEWED AT LENGTH WITH PATIENT TODAY   Patient  satisfied with Plan of action and management. All questions answered   Follow up 1 year   I spent a total of 57 minutes dedicated to the care of this patient on the date of this encounter to include pre-visit review of records, face-to-face time with the patient discussing conditions above, post visit ordering of testing, clinical documentation with the electronic health record, making appropriate referrals as documented, and communicating necessary information to the patient's healthcare team.    The Patient requires high complexity decision making for assessment and support, frequent evaluation and titration of therapies, application of advanced monitoring technologies and extensive interpretation of multiple databases.  Patient satisfied with Plan of action and management. All questions answered    Nickolas Alm Cellar, M.D.  [facility] Pulmonary & Critical Care Medicine  Medical Director [facility] [place]

## 2023-09-04 ENCOUNTER — Telehealth: Payer: Self-pay | Admitting: Family

## 2023-09-04 NOTE — Telephone Encounter (Incomplete)
 Called to confirm/remind patient of their appointment at the [facility] on 09/05/23***.   Appointment:   [x] Confirmed  [] Left mess   [] No answer/No voice mail  [] Phone not in service  Patient reminded to bring all medications and/or complete list.  Confirmed patient has transportation. Gave directions, instructed to utilize valet parking.

## 2023-09-04 NOTE — Progress Notes (Unsigned)
 PCP: Anselm Jungling, NP (at group home) Primary Cardiologist: Yvonne Kendall, MD (last seen 02/25) HF provider: Marca Ancona, MD   Chief Complaint: shortness of breath  HPI:  Daniel [name] is a 33 y/o male with a history of DM, hyperlipidemia, HTN, paroxysmal atrial fibrillation, conversion disorder, sleep apnea, schizoaffective disorder, seizure, TIA, urethral stricture, former tobacco use and chronic heart failure. Patient has had a long history of paroxysmal AF.  He says this was diagnosed when he was 20 and he was told that it was triggered by excessive use of energy drinks.  He never abused drugs or ETOH per his report though he was a smoker until 5/23.  He was followed in [place] in the past. There was talk of atrial fibrillation ablation but it was never undertaken. Subsequently, he moved to a group home in [place].    Patient has had home oxygen since 2022 per his report.  High resolution CT chest in 2/24 showed no ILD, possible small airways disease.  PFTs in 2/24 showed minimal obstructive airways disease.  Sleep study showed severe OSA.  Was in the ED 01/14/23 due to increasing swelling in both of his legs along with some difficulty breathing over the past 2 days. EKG shows no evidence of arrhythmia or ischemia and troponin within normal limits, chest x-ray negative for acute process.  No significant anemia, leukocytosis, tract abnormality, or AKI noted. Urinalysis with no signs of infection. IV lasix given. Was in the ED 01/23/23 due to shortness of breath and chest pain that started earlier today. Also complains of swelling in bilateral legs. EKG chest x-ray labs and vital signs are normal. IV lasix provided with higher outpatient lasix dose for a few days. Was in the ED 02/02/23 due to acute left-sided weakness. Patient reports he was awake at that time and around 1:40 AM developed sudden nausea, pulsating in his head and a pounding headache with diffuse blurry vision throughout his  visual fields. CT and angiogram are reassuring, no LVO. Subsequent MRI brain without evidence of acute pathology or acute strokes. With management of his migraine with Fioricet, Benadryl and Compazine, his symptoms resolved and neurologically back to baseline. Normal metabolic panel and CBC. Had 2 ED visits 09/24 for atypical chest pain and cystitis. ED visit 03/06/23 due to blood in the stool and severe abdominal pain. Abdominal CT negative. Was in the ED 04/04/23 due to fall, abdominal pain, near syncope and nausea. CT concerning for possible cystitis but UA is reassuring so does not seem to be UTI. The rest of his CT scans were all reassuring.   Was in the ED 08/28/23 with chest pain along with feeling dizzy & then hot. Recently finished a course of antibiotics for urinary symptoms. CXR clear. Mildly elevated BNP at 180.Respiratory panel and UA negative. Given increase lasix dose for 5 days and he was released.   He presents today for a HF f/u visit with a chief complaint of shortness of breath. Has associated fatigue, wheezing, edema, occasional chest pain and dizziness. Denies abdominal distention or difficulty sleeping. Does admit to sleeping more during the day which then makes it difficult to sleep at night.   Ate 2 packages of ramen noodles w/ seasoning packet yesterday. Tends to eat sausage patties twice weekly.      Cardiac Test Echo 08/04/21: EF of 50% along with mild LVH Echo 10/22/21: EF of 45-50%. Echo 08/22/22: EF 55-60%.    RHC 10/07/22: Hemodynamics (mmHg) RA mean 2 RV 20/2 PA  23/8, mean 15 PCWP mean 3 Oxygen saturations: PA 74% AO 96% Cardiac Output (Fick) 9.71  Cardiac Index (Fick) 3.34   ROS: All systems negative except as listed in HPI, PMH and Problem List.  SH:  Social History   Socioeconomic History   Marital status: Single    Spouse name: Not on file   Number of children: Not on file   Years of education: Not on file   Highest education level: Not on file   Occupational History   Not on file  Tobacco Use   Smoking status: Former    Current packs/day: 0.00    Types: Cigarettes    Start date: 08/2019    Quit date: 08/2021    Years since quitting: 2.0   Smokeless tobacco: Never  Vaping Use   Vaping status: Never Used  Substance and Sexual Activity   Alcohol use: Not Currently   Drug use: Never   Sexual activity: Not Currently  Other Topics Concern   Not on file  Social History Narrative   Now living in a group home in [place].   Social Drivers of Health   Financial Resource Strain: Low Risk  (03/16/2021)   Received from [employer], [facility]   Overall Financial Resource Strain (CARDIA)    Difficulty of Paying Living Expenses: Not very hard  Food Insecurity: No Food Insecurity (09/01/2020)   Received from [facility], [facility]   Hunger Vital Sign    Worried About Running Out of Food in the Last Year: Never true    Ran Out of Food in the Last Year: Never true  Transportation Needs: Not on file  Physical Activity: Not on file  Stress: No Stress Concern Present (03/16/2021)   Received from [facility], [facility]   [employer] [facility] - Occupational Stress Questionnaire    Feeling of Stress : Not at all  Social Connections: Unknown (09/27/2021)   Received from [facility], [facility]   Social Network    Social Network: Not on file  Intimate Partner Violence: Unknown (09/01/2021)   Received from [employer], [facility]   HITS    Physically Hurt: Not on file    Insult or Talk Down To: Not on file    Threaten Physical Harm: Not on file    Scream or Curse: Not on file    FH:  Family History  Problem Relation Age of Onset   Colon cancer Father    Breast cancer Paternal Grandmother     Past Medical History:  Diagnosis Date   Cardiomyopathy (HCC)    a.) TTE 08/04/2021: EF 50%; b.) TTE 10/22/2021: EF 45-50-%; c.) TTE 08/22/2022: EF 55-60%   Chest pain    CHF (congestive heart  failure) (HCC)    a.) TTE 08/04/2021: EF 50%, mild LVH, RVSF norm; b.) TTE 10/22/2021: EF 45-50%, glob HK, RVSF norm; c.) TTE 08/22/2022: EF 55-60%, mild dil LV, RVSF norm.   Conversion disorder    Current use of long term anticoagulation    Dyspnea    GERD (gastroesophageal reflux disease)    Hepatic steatosis    Hyperlipidemia    Hypertension    Insomnia    a.) melatonin PRN   Long term current use of anticoagulant    a.) apixaban   OSA on CPAP    Persistent atrial fibrillation and flutter (HCC)    a.) CHA2DS2VASc = 5 (CHF, HTN, TIA x 2, T2DM);  b.) rate/rhythm maintained on oral diltiazem +  metoprolol succinate; chronically anticoagulated with apixaban   Schizoaffective disorder (HCC)    Seizure disorder (HCC)    T2DM (type 2 diabetes mellitus) (HCC)    TIA (transient ischemic attack)    Urethral stricture    a. 08/2021 s/p cystoscopy and urethral dilation.    Current Outpatient Medications  Medication Sig Dispense Refill   acetaminophen (TYLENOL) 325 MG tablet Take 2 tablets (650 mg total) by mouth every 4 (four) hours as needed. 100 tablet 2   albuterol (VENTOLIN HFA) 108 (90 Base) MCG/ACT inhaler Inhale 2 puffs into the lungs every 6 (six) hours as needed for wheezing. 1 each 2   allopurinol (ZYLOPRIM) 300 MG tablet Take 1 tablet (300 mg total) by mouth daily. 30 tablet 0   apixaban (ELIQUIS) 5 MG TABS tablet @ TAKE 1 TABLET BY MOUTH TWICE DAILY 60 tablet 11   atorvastatin (LIPITOR) 80 MG tablet Take 1 tablet (80 mg total) by mouth daily. 30 tablet 10   baclofen (LIORESAL) 10 MG tablet Take 1 tablet (10 mg total) by mouth 2 (two) times daily as needed for muscle spasms. Home med. (Patient taking differently: Take 10 mg by mouth 2 (two) times daily. Home med.) 30 each 0   benztropine (COGENTIN) 1 MG tablet Take 1 mg by mouth daily.     budesonide-formoterol (SYMBICORT) 160-4.5 MCG/ACT inhaler Inhale 2 puffs into the lungs 2 (two) times daily. 1 each 6   CRANBERRY EXTRACT PO Take  by mouth.     cyclobenzaprine (FLEXERIL) 5 MG tablet Take 5 mg by mouth 3 (three) times daily as needed for muscle spasms.     dapagliflozin propanediol (FARXIGA) 10 MG TABS tablet Take 1 tablet (10 mg total) by mouth daily before breakfast. 30 tablet 5   diltiazem (CARDIZEM CD) 360 MG 24 hr capsule @ TAKE 1 CAPSULE BY MOUTH ONCE DAILY *DO NOT CRUSH OR CHEW* 30 capsule 5   divalproex (DEPAKOTE) 500 MG DR tablet Take 2 tablets (1,000 mg total) by mouth 2 (two) times daily. (Patient taking differently: Take 1,000 mg by mouth daily at 6 (six) AM. 1000mg  QAM)     Dulaglutide (TRULICITY) 3 MG/0.5ML SOAJ Inject 3 mg as directed once a week. 2 mL 5   escitalopram (LEXAPRO) 10 MG tablet Take 20 mg by mouth daily.     fluticasone (FLONASE) 50 MCG/ACT nasal spray Place 2 sprays into both nostrils 2 (two) times daily. 1 g 10   furosemide (LASIX) 20 MG tablet Take 1 tablet (20 mg total) by mouth daily. 30 tablet 11   hydrOXYzine (ATARAX) 25 MG tablet Take 25 mg by mouth daily. At noon     INVEGA 9 MG 24 hr tablet Take 9 mg by mouth every morning.     lithium carbonate (LITHOBID) 300 MG CR tablet Take 900 mg by mouth every evening. 1 tab QAM, 2 tab QPM     loratadine (CLARITIN) 10 MG tablet Take 10 mg by mouth daily.     melatonin 3 MG TABS tablet Take 3 mg by mouth at bedtime.     metoprolol succinate (TOPROL-XL) 100 MG 24 hr tablet TAKE 1 AND 1/2 TABLET (150MG ) BY MOUTH ONCE DAILY  WITH OR IMMEDIATELY FOLLOWING A MEAL. 42 tablet 9   Multiple Vitamins-Minerals (MULTIVITAMIN GUMMIES ADULT PO) Take by mouth.     mupirocin ointment (BACTROBAN) 2 % SMARTSIG:1 Application Topical 2-3 Times Daily     omeprazole (PRILOSEC) 20 MG capsule Take 20 mg by mouth daily.  ondansetron (ZOFRAN-ODT) 4 MG disintegrating tablet Take 1 tablet (4 mg total) by mouth every 8 (eight) hours as needed. 20 tablet 0   propranolol (INDERAL) 10 MG tablet Take 10 mg by mouth 2 (two) times daily.     sacubitril-valsartan (ENTRESTO) 24-26  MG TAKE 1 TABLET BY MOUTH TWICE DAILY 60 tablet 11   sodium chloride (OCEAN) 0.65 % SOLN nasal spray Place 1 spray into both nostrils as needed for congestion. 10 mL 5   spironolactone (ALDACTONE) 25 MG tablet Take 0.5 tablets (12.5 mg total) by mouth daily. 45 tablet 3   tamsulosin (FLOMAX) 0.4 MG CAPS capsule Take 1 capsule (0.4 mg total) by mouth daily. 30 capsule 2   No current facility-administered medications for this visit.   Vitals:   09/05/23 0838  BP: 110/73  Pulse: 75  SpO2: 96%  Weight: (!) 355 lb (161 kg)   Wt Readings from Last 3 Encounters:  09/05/23 (!) 355 lb (161 kg)  08/31/23 (!) 361 lb (163.7 kg)  08/24/23 (!) 348 lb 14.4 oz (158.3 kg)   Lab Results  Component Value Date   CREATININE 0.78 08/09/2023   CREATININE 1.11 05/24/2023   CREATININE 0.97 04/04/2023    PHYSICAL EXAM:  General: Well appearing. No resp difficulty HEENT: normal Neck: supple, no JVD Cor: Regular rhythm, rate. No rubs, gallops or murmurs Lungs: clear Abdomen: soft, nontender, nondistended. Extremities: no cyanosis, clubbing, rash, 2+ pitting edema bilateral lower legs up to knees Neuro: alert & oriented X 3. Moves all 4 extremities w/o difficulty. Affect pleasant   EKG: not done  ReDs reading: 40 %, abnormal (see plan below)   ASSESSMENT & PLAN:  1: Chronic HFpEF - suspect due to AF/ OSA - Echo 08/04/21: EF of 50% along with mild LVH - Echo 10/22/21: EF of 45-50%. - Echo 08/22/22: EF 55-60%.   - NYHA II.  - fluid up with increased symptoms, worsening edema and elevated ReDs - ReDs: 40% - increase furosemide to 40mg  daily X 3 days, then decrease back to 20mg  daily - BMET/ proBNP today; repeat next week - weight unchanged from last visit here 3 months ago - continue farxiga 10mg  daily - furosemide adjusted above - continue metoprolol succinate 150mg  daily - continue entresto 24/26mg  BID - continue spironolactone 12.5mg  daily - reviewed diet extensively regarding high sodium  content of ramen noodles and that he should NOT use the seasoning packet - BNP 08/09/23 was 180.7  2: HTN - BP 110/73 - BMET 08/09/23 reviewed: sodium 138, potassium 4.0, creatinine 0.78 & GFR >60 - BMET today  3: Paroxysmal atrial fibrillation- - Regular on exam.  - Continue metoprolol succinate 150mg  daily - continue diltiazem 360mg  daily - Continue eliquis 5 mg twice a day.  - saw cardiology (End) 02/25  4: Obesity- - continue trulicity 3mg  weekly  5: OSA - Using Bipap nightly - saw pulmonology ([place]) 04/25  6: DM- - A1c 08/05/22 was 5.8% - continue atorvastatin 80mg  daily; check lipid panel next visit   Return in 1 week, sooner if needed.   Delma Freeze NP-C  2:32 PM

## 2023-09-05 ENCOUNTER — Encounter: Payer: Self-pay | Admitting: Family

## 2023-09-05 ENCOUNTER — Ambulatory Visit: Payer: MEDICAID | Attending: Family | Admitting: Family

## 2023-09-05 VITALS — BP 110/73 | HR 75 | Wt 355.0 lb

## 2023-09-05 DIAGNOSIS — I5032 Chronic diastolic (congestive) heart failure: Secondary | ICD-10-CM | POA: Insufficient documentation

## 2023-09-05 DIAGNOSIS — Z87891 Personal history of nicotine dependence: Secondary | ICD-10-CM | POA: Diagnosis not present

## 2023-09-05 DIAGNOSIS — Z8673 Personal history of transient ischemic attack (TIA), and cerebral infarction without residual deficits: Secondary | ICD-10-CM | POA: Diagnosis not present

## 2023-09-05 DIAGNOSIS — E785 Hyperlipidemia, unspecified: Secondary | ICD-10-CM | POA: Insufficient documentation

## 2023-09-05 DIAGNOSIS — E119 Type 2 diabetes mellitus without complications: Secondary | ICD-10-CM | POA: Diagnosis not present

## 2023-09-05 DIAGNOSIS — Z7901 Long term (current) use of anticoagulants: Secondary | ICD-10-CM | POA: Diagnosis not present

## 2023-09-05 DIAGNOSIS — I10 Essential (primary) hypertension: Secondary | ICD-10-CM | POA: Diagnosis not present

## 2023-09-05 DIAGNOSIS — I110 Hypertensive heart disease with heart failure: Secondary | ICD-10-CM | POA: Insufficient documentation

## 2023-09-05 DIAGNOSIS — F259 Schizoaffective disorder, unspecified: Secondary | ICD-10-CM | POA: Insufficient documentation

## 2023-09-05 DIAGNOSIS — E669 Obesity, unspecified: Secondary | ICD-10-CM | POA: Diagnosis not present

## 2023-09-05 DIAGNOSIS — I480 Paroxysmal atrial fibrillation: Secondary | ICD-10-CM | POA: Insufficient documentation

## 2023-09-05 DIAGNOSIS — Z7984 Long term (current) use of oral hypoglycemic drugs: Secondary | ICD-10-CM | POA: Insufficient documentation

## 2023-09-05 DIAGNOSIS — G4733 Obstructive sleep apnea (adult) (pediatric): Secondary | ICD-10-CM | POA: Insufficient documentation

## 2023-09-05 DIAGNOSIS — Z7985 Long-term (current) use of injectable non-insulin antidiabetic drugs: Secondary | ICD-10-CM | POA: Diagnosis not present

## 2023-09-05 DIAGNOSIS — Z79899 Other long term (current) drug therapy: Secondary | ICD-10-CM | POA: Diagnosis not present

## 2023-09-05 NOTE — Patient Instructions (Signed)
 Medication Changes:  INCREASE LASIX 40 MG ONCE DAILY FOR 3 DAYS  THEN, AFTER YOUR 3 DAYS, DECREASE BACK TO 20 MG ONCE DAILY   Lab Work:  Go DOWN to LOWER LEVEL (LL) to have your blood work completed inside of [employer] office.  We will only call you if the results are abnormal or if the provider would like to make medication changes.   Follow-Up in: 1 week with [name], FNP.  At the [facility], you and your health needs are our priority. We have a designated team specialized in the treatment of Heart Failure. This Care Team includes your primary Heart Failure Specialized Cardiologist (physician), Advanced Practice Providers (APPs- Physician Assistants and Nurse Practitioners), and Pharmacist who all work together to provide you with the care you need, when you need it.   You may see any of the following providers on your designated Care Team at your next follow up:  Dr. Arvilla Meres Dr. Marca Ancona Dr. Dorthula Nettles Dr. Theresia Bough [name], FNP Enos Fling, RPH-CPP  Please be sure to bring in all your medications bottles to every appointment.   Need to Contact us:  If you have any questions or concerns before your next appointment please send [place] a message through [place] or call our office at [phone].    TO LEAVE A MESSAGE FOR THE NURSE SELECT OPTION 2, PLEASE LEAVE A MESSAGE INCLUDING: YOUR NAME DATE OF BIRTH CALL BACK NUMBER REASON FOR CALL**this is important as we prioritize the call backs  YOU WILL RECEIVE A CALL BACK THE SAME DAY AS LONG AS YOU CALL BEFORE 4:00 PM

## 2023-09-05 NOTE — Progress Notes (Signed)
 ReDS Vest / Clip - 09/05/23 6962       ReDS Vest / Clip   Station Marker D    Ruler Value 36    ReDS Value Range Moderate volume overload    ReDS Actual Value 40

## 2023-09-06 LAB — BASIC METABOLIC PANEL WITH GFR
BUN/Creatinine Ratio: 17 (ref 9–20)
BUN: 15 mg/dL (ref 6–20)
CO2: 21 mmol/L (ref 20–29)
Calcium: 9.4 mg/dL (ref 8.7–10.2)
Chloride: 99 mmol/L (ref 96–106)
Creatinine, Ser: 0.89 mg/dL (ref 0.76–1.27)
Glucose: 95 mg/dL (ref 70–99)
Potassium: 4.9 mmol/L (ref 3.5–5.2)
Sodium: 137 mmol/L (ref 134–144)
eGFR: 116 mL/min/{1.73_m2} (ref >59)

## 2023-09-06 LAB — PRO B NATRIURETIC PEPTIDE: NT-Pro BNP: 227 pg/mL — ABNORMAL HIGH (ref 0–86)

## 2023-09-11 ENCOUNTER — Telehealth: Payer: Self-pay | Admitting: Family

## 2023-09-11 NOTE — Telephone Encounter (Incomplete)
 Called to confirm/remind patient of their appointment at the [facility] on 09/12/23.   Appointment:   [x] Confirmed  [] Left mess   [] No answer/No voice mail  [] Phone not in service  Patient reminded to bring all medications and/or complete list.  Confirmed patient has transportation. Gave directions, instructed to utilize valet parking.

## 2023-09-11 NOTE — Progress Notes (Deleted)
 Advanced Heart Failure Clinic Note      PCP: Anselm Jungling, NP (at group home) Primary Cardiologist: Yvonne Kendall, MD (last seen 02/25) HF provider: Marca Ancona, MD   Chief Complaint: shortness of breath  HPI:  Daniel [name] is a 33 y/o male with a history of DM, hyperlipidemia, HTN, paroxysmal atrial fibrillation, conversion disorder, sleep apnea, schizoaffective disorder, seizure, TIA, urethral stricture, former tobacco use and chronic heart failure. Patient has had a long history of paroxysmal AF.  He says this was diagnosed when he was 20 and he was told that it was triggered by excessive use of energy drinks.  He never abused drugs or ETOH per his report though he was a smoker until 5/23.  He was followed in [place] in the past. There was talk of atrial fibrillation ablation but it was never undertaken. Subsequently, he moved to a group home in [place].    Patient has had home oxygen since 2022 per his report.  High resolution CT chest in 2/24 showed no ILD, possible small airways disease.  PFTs in 2/24 showed minimal obstructive airways disease.  Sleep study showed severe OSA.  Was in the ED 01/14/23 due to increasing swelling in both of his legs along with some difficulty breathing over the past 2 days. EKG shows no evidence of arrhythmia or ischemia and troponin within normal limits, chest x-ray negative for acute process.  No significant anemia, leukocytosis, tract abnormality, or AKI noted. Urinalysis with no signs of infection. IV lasix given. Was in the ED 01/23/23 due to shortness of breath and chest pain that started earlier today. Also complains of swelling in bilateral legs. EKG chest x-ray labs and vital signs are normal. IV lasix provided with higher outpatient lasix dose for a few days. Was in the ED 02/02/23 due to acute left-sided weakness. Patient reports he was awake at that time and around 1:40 AM developed sudden nausea, pulsating in his head and a pounding  headache with diffuse blurry vision throughout his visual fields. CT and angiogram are reassuring, no LVO. Subsequent MRI brain without evidence of acute pathology or acute strokes. With management of his migraine with Fioricet, Benadryl and Compazine, his symptoms resolved and neurologically back to baseline. Normal metabolic panel and CBC. Had 2 ED visits 09/24 for atypical chest pain and cystitis. ED visit 03/06/23 due to blood in the stool and severe abdominal pain. Abdominal CT negative. Was in the ED 04/04/23 due to fall, abdominal pain, near syncope and nausea. CT concerning for possible cystitis but UA is reassuring so does not seem to be UTI. The rest of his CT scans were all reassuring.   Was in the ED 08/28/23 with chest pain along with feeling dizzy & then hot. Recently finished a course of antibiotics for urinary symptoms. CXR clear. Mildly elevated BNP at 180.Respiratory panel and UA negative. Given increase lasix dose for 5 days and he was released.   He presents today for a HF f/u visit with a chief complaint of shortness of breath. Has associated fatigue, wheezing, edema, occasional chest pain and dizziness. Denies abdominal distention or difficulty sleeping. Does admit to sleeping more during the day which then makes it difficult to sleep at night.   Ate 2 packages of ramen noodles w/ seasoning packet yesterday. Tends to eat sausage patties twice weekly.      Cardiac Test Echo 08/04/21: EF of 50% along with mild LVH Echo 10/22/21: EF of 45-50%. Echo 08/22/22: EF 55-60%.    RHC  10/07/22: Hemodynamics (mmHg) RA mean 2 RV 20/2 PA 23/8, mean 15 PCWP mean 3 Oxygen saturations: PA 74% AO 96% Cardiac Output (Fick) 9.71  Cardiac Index (Fick) 3.34   ROS: All systems negative except as listed in HPI, PMH and Problem List.  SH:  Social History   Socioeconomic History   Marital status: Single    Spouse name: Not on file   Number of children: Not on file   Years of education: Not on  file   Highest education level: Not on file  Occupational History   Not on file  Tobacco Use   Smoking status: Former    Current packs/day: 0.00    Types: Cigarettes    Start date: 08/2019    Quit date: 08/2021    Years since quitting: 2.0   Smokeless tobacco: Never  Vaping Use   Vaping status: Never Used  Substance and Sexual Activity   Alcohol use: Not Currently   Drug use: Never   Sexual activity: Not Currently  Other Topics Concern   Not on file  Social History Narrative   Now living in a group home in [place].   Social Drivers of Health   Financial Resource Strain: Low Risk  (03/16/2021)   Received from [employer], [facility]   Overall Financial Resource Strain (CARDIA)    Difficulty of Paying Living Expenses: Not very hard  Food Insecurity: No Food Insecurity (09/01/2020)   Received from [facility], [facility]   Hunger Vital Sign    Worried About Running Out of Food in the Last Year: Never true    Ran Out of Food in the Last Year: Never true  Transportation Needs: Not on file  Physical Activity: Not on file  Stress: No Stress Concern Present (03/16/2021)   Received from [facility], [facility]   [employer] [facility] Occupational [facility] - Occupational Stress Questionnaire    Feeling of Stress : Not at all  Social Connections: Unknown (09/27/2021)   Received from [facility], [facility]   Social Network    Social Network: Not on file  Intimate Partner Violence: Unknown (09/01/2021)   Received from [employer], [facility]   HITS    Physically Hurt: Not on file    Insult or Talk Down To: Not on file    Threaten Physical Harm: Not on file    Scream or Curse: Not on file    FH:  Family History  Problem Relation Age of Onset   Colon cancer Father    Breast cancer Paternal Grandmother     Past Medical History:  Diagnosis Date   Cardiomyopathy (HCC)    a.) TTE 08/04/2021: EF 50%; b.) TTE 10/22/2021: EF 45-50-%; c.) TTE 08/22/2022: EF  55-60%   Chest pain    CHF (congestive heart failure) (HCC)    a.) TTE 08/04/2021: EF 50%, mild LVH, RVSF norm; b.) TTE 10/22/2021: EF 45-50%, glob HK, RVSF norm; c.) TTE 08/22/2022: EF 55-60%, mild dil LV, RVSF norm.   Conversion disorder    Current use of long term anticoagulation    Dyspnea    GERD (gastroesophageal reflux disease)    Hepatic steatosis    Hyperlipidemia    Hypertension    Insomnia    a.) melatonin PRN   Long term current use of anticoagulant    a.) apixaban   OSA on CPAP    Persistent atrial fibrillation and flutter (HCC)    a.) CHA2DS2VASc = 5 (CHF, HTN, TIA x 2,  T2DM);  b.) rate/rhythm maintained on oral diltiazem + metoprolol succinate; chronically anticoagulated with apixaban   Schizoaffective disorder (HCC)    Seizure disorder (HCC)    T2DM (type 2 diabetes mellitus) (HCC)    TIA (transient ischemic attack)    Urethral stricture    a. 08/2021 s/p cystoscopy and urethral dilation.    Current Outpatient Medications  Medication Sig Dispense Refill   acetaminophen (TYLENOL) 325 MG tablet Take 2 tablets (650 mg total) by mouth every 4 (four) hours as needed. 100 tablet 2   albuterol (VENTOLIN HFA) 108 (90 Base) MCG/ACT inhaler Inhale 2 puffs into the lungs every 6 (six) hours as needed for wheezing. 1 each 2   allopurinol (ZYLOPRIM) 300 MG tablet Take 1 tablet (300 mg total) by mouth daily. 30 tablet 0   apixaban (ELIQUIS) 5 MG TABS tablet @ TAKE 1 TABLET BY MOUTH TWICE DAILY 60 tablet 11   atorvastatin (LIPITOR) 80 MG tablet Take 1 tablet (80 mg total) by mouth daily. 30 tablet 10   baclofen (LIORESAL) 10 MG tablet Take 1 tablet (10 mg total) by mouth 2 (two) times daily as needed for muscle spasms. Home med. (Patient taking differently: Take 10 mg by mouth 2 (two) times daily. Home med.) 30 each 0   benztropine (COGENTIN) 1 MG tablet Take 1 mg by mouth 3 (three) times daily.     budesonide-formoterol (SYMBICORT) 160-4.5 MCG/ACT inhaler Inhale 2 puffs into the  lungs 2 (two) times daily. 1 each 6   CRANBERRY EXTRACT PO Take by mouth.     cyclobenzaprine (FLEXERIL) 5 MG tablet Take 5 mg by mouth 3 (three) times daily as needed for muscle spasms.     dapagliflozin propanediol (FARXIGA) 10 MG TABS tablet Take 1 tablet (10 mg total) by mouth daily before breakfast. 30 tablet 5   diltiazem (CARDIZEM CD) 360 MG 24 hr capsule @ TAKE 1 CAPSULE BY MOUTH ONCE DAILY *DO NOT CRUSH OR CHEW* 30 capsule 5   divalproex (DEPAKOTE) 500 MG DR tablet Take 2 tablets (1,000 mg total) by mouth 2 (two) times daily. (Patient taking differently: Take 1,000 mg by mouth daily at 6 (six) AM. 1000mg  QAM)     Dulaglutide (TRULICITY) 3 MG/0.5ML SOAJ Inject 3 mg as directed once a week. (Patient taking differently: Inject 3 mg as directed every Sunday.) 2 mL 5   escitalopram (LEXAPRO) 10 MG tablet Take 20 mg by mouth daily.     fluticasone (FLONASE) 50 MCG/ACT nasal spray Place 2 sprays into both nostrils 2 (two) times daily. 1 g 10   furosemide (LASIX) 20 MG tablet Take 1 tablet (20 mg total) by mouth daily. 30 tablet 11   hydrOXYzine (ATARAX) 25 MG tablet Take 25 mg by mouth daily. At noon     INVEGA 9 MG 24 hr tablet Take 9 mg by mouth every morning.     lithium carbonate (LITHOBID) 300 MG CR tablet Take 900 mg by mouth every evening. 1 tab QAM, 2 tab QPM     loratadine (CLARITIN) 10 MG tablet Take 10 mg by mouth daily.     melatonin 3 MG TABS tablet Take 3 mg by mouth at bedtime.     metoprolol succinate (TOPROL-XL) 100 MG 24 hr tablet TAKE 1 AND 1/2 TABLET (150MG ) BY MOUTH ONCE DAILY  WITH OR IMMEDIATELY FOLLOWING A MEAL. 42 tablet 9   Multiple Vitamins-Minerals (MULTIVITAMIN GUMMIES ADULT PO) Take by mouth.     mupirocin ointment (BACTROBAN) 2 % SMARTSIG:1  Application Topical 2-3 Times Daily (Patient not taking: Reported on 09/05/2023)     ondansetron (ZOFRAN-ODT) 4 MG disintegrating tablet Take 1 tablet (4 mg total) by mouth every 8 (eight) hours as needed. 20 tablet 0    propranolol (INDERAL) 10 MG tablet Take 10 mg by mouth 2 (two) times daily.     sacubitril-valsartan (ENTRESTO) 24-26 MG TAKE 1 TABLET BY MOUTH TWICE DAILY 60 tablet 11   sodium chloride (OCEAN) 0.65 % SOLN nasal spray Place 1 spray into both nostrils as needed for congestion. 10 mL 5   spironolactone (ALDACTONE) 25 MG tablet Take 0.5 tablets (12.5 mg total) by mouth daily. 45 tablet 3   tamsulosin (FLOMAX) 0.4 MG CAPS capsule Take 1 capsule (0.4 mg total) by mouth daily. 30 capsule 2   No current facility-administered medications for this visit.   There were no vitals filed for this visit.  Wt Readings from Last 3 Encounters:  09/05/23 (!) 355 lb (161 kg)  08/31/23 (!) 361 lb (163.7 kg)  08/24/23 (!) 348 lb 14.4 oz (158.3 kg)   Lab Results  Component Value Date   CREATININE 0.89 09/05/2023   CREATININE 0.78 08/09/2023   CREATININE 1.11 05/24/2023    PHYSICAL EXAM:  General: Well appearing. No resp difficulty HEENT: normal Neck: supple, no JVD Cor: Regular rhythm, rate. No rubs, gallops or murmurs Lungs: clear Abdomen: soft, nontender, nondistended. Extremities: no cyanosis, clubbing, rash, 2+ pitting edema bilateral lower legs up to knees Neuro: alert & oriented X 3. Moves all 4 extremities w/o difficulty. Affect pleasant   EKG: not done  ReDs reading: 40 %, abnormal (see plan below)   ASSESSMENT & PLAN:  1: Chronic HFpEF - suspect due to AF/ OSA - Echo 08/04/21: EF of 50% along with mild LVH - Echo 10/22/21: EF of 45-50%. - Echo 08/22/22: EF 55-60%.   - NYHA II.  - fluid up with increased symptoms, worsening edema and elevated ReDs - ReDs: 40% - increase furosemide to 40mg  daily X 3 days, then decrease back to 20mg  daily - BMET/ proBNP today; repeat next week - weight unchanged from last visit here 3 months ago - continue farxiga 10mg  daily - furosemide adjusted above - continue metoprolol succinate 150mg  daily - continue entresto 24/26mg  BID - continue  spironolactone 12.5mg  daily - reviewed diet extensively regarding high sodium content of ramen noodles and that he should NOT use the seasoning packet - BNP 08/09/23 was 180.7  2: HTN - BP 110/73 - BMET 08/09/23 reviewed: sodium 138, potassium 4.0, creatinine 0.78 & GFR >60 - BMET today  3: Paroxysmal atrial fibrillation- - Regular on exam.  - Continue metoprolol succinate 150mg  daily - continue diltiazem 360mg  daily - Continue eliquis 5 mg twice a day.  - saw cardiology (End) 02/25  4: Obesity- - continue trulicity 3mg  weekly  5: OSA - Using Bipap nightly - saw pulmonology (K[place]) 04/25  6: DM- - A1c 08/05/22 was 5.8% - continue atorvastatin 80mg  daily; check lipid panel next visit   Return in 1 week, sooner if needed.   Charlette Console NP-C  6:23 PM     Charlette Console, [place] 09/11/23

## 2023-09-12 ENCOUNTER — Encounter: Payer: MEDICAID | Admitting: Family

## 2023-09-12 ENCOUNTER — Telehealth: Payer: Self-pay | Admitting: Family

## 2023-09-12 NOTE — Telephone Encounter (Signed)
 Patient did not show for his Heart Failure Clinic appointment on 09/12/23.

## 2023-09-20 ENCOUNTER — Emergency Department: Payer: MEDICAID

## 2023-09-20 ENCOUNTER — Emergency Department
Admission: EM | Admit: 2023-09-20 | Discharge: 2023-09-20 | Disposition: A | Discharge status: Home / Self Care | Payer: MEDICAID | Attending: Emergency Medicine | Admitting: Emergency Medicine

## 2023-09-20 ENCOUNTER — Other Ambulatory Visit: Payer: Self-pay

## 2023-09-20 DIAGNOSIS — I509 Heart failure, unspecified: Secondary | ICD-10-CM | POA: Insufficient documentation

## 2023-09-20 DIAGNOSIS — R4182 Altered mental status, unspecified: Secondary | ICD-10-CM

## 2023-09-20 DIAGNOSIS — E119 Type 2 diabetes mellitus without complications: Secondary | ICD-10-CM | POA: Diagnosis not present

## 2023-09-20 DIAGNOSIS — F319 Bipolar disorder, unspecified: Secondary | ICD-10-CM | POA: Insufficient documentation

## 2023-09-20 DIAGNOSIS — I110 Hypertensive heart disease with heart failure: Secondary | ICD-10-CM | POA: Diagnosis not present

## 2023-09-20 DIAGNOSIS — I4891 Unspecified atrial fibrillation: Secondary | ICD-10-CM | POA: Insufficient documentation

## 2023-09-20 LAB — COMPREHENSIVE METABOLIC PANEL WITH GFR
ALT: 30 U/L (ref 0–44)
AST: 26 U/L (ref 15–41)
Albumin: 3.8 g/dL (ref 3.5–5.0)
Alkaline Phosphatase: 60 U/L (ref 38–126)
Anion gap: 9 (ref 5–15)
BUN: 11 mg/dL (ref 6–20)
CO2: 25 mmol/L (ref 22–32)
Calcium: 8.8 mg/dL — ABNORMAL LOW (ref 8.9–10.3)
Chloride: 100 mmol/L (ref 98–111)
Creatinine, Ser: 0.97 mg/dL (ref 0.61–1.24)
GFR, Estimated: >60 mL/min (ref >60)
Glucose, Bld: 101 mg/dL — ABNORMAL HIGH (ref 70–99)
Potassium: 4.1 mmol/L (ref 3.5–5.1)
Sodium: 134 mmol/L — ABNORMAL LOW (ref 135–145)
Total Bilirubin: 0.8 mg/dL (ref 0.0–1.2)
Total Protein: 7.1 g/dL (ref 6.5–8.1)

## 2023-09-20 LAB — CBC WITH DIFFERENTIAL/PLATELET
Abs Immature Granulocytes: 0.06 10*3/uL (ref 0.00–0.07)
Basophils Absolute: 0 10*3/uL (ref 0.0–0.1)
Basophils Relative: 1 %
Eosinophils Absolute: 0.1 10*3/uL (ref 0.0–0.5)
Eosinophils Relative: 2 %
HCT: 41 % (ref 39.0–52.0)
Hemoglobin: 13.5 g/dL (ref 13.0–17.0)
Immature Granulocytes: 1 %
Lymphocytes Relative: 21 %
Lymphs Abs: 1.5 10*3/uL (ref 0.7–4.0)
MCH: 29.3 pg (ref 26.0–34.0)
MCHC: 32.9 g/dL (ref 30.0–36.0)
MCV: 88.9 fL (ref 80.0–100.0)
Monocytes Absolute: 0.6 10*3/uL (ref 0.1–1.0)
Monocytes Relative: 8 %
Neutro Abs: 4.7 10*3/uL (ref 1.7–7.7)
Neutrophils Relative %: 67 %
Platelets: 211 10*3/uL (ref 150–400)
RBC: 4.61 MIL/uL (ref 4.22–5.81)
RDW: 13.4 % (ref 11.5–15.5)
WBC: 7 10*3/uL (ref 4.0–10.5)
nRBC: 0 % (ref 0.0–0.2)

## 2023-09-20 LAB — VALPROIC ACID LEVEL: Valproic Acid Lvl: 31 ug/mL — ABNORMAL LOW (ref 50–100)

## 2023-09-20 LAB — TROPONIN I (HIGH SENSITIVITY): Troponin I (High Sensitivity): <2 ng/L (ref <18)

## 2023-09-20 LAB — LITHIUM LEVEL: Lithium Lvl: 0.66 mmol/L (ref 0.60–1.20)

## 2023-09-20 MED ORDER — ACETAMINOPHEN 500 MG PO TABS
1000.0000 mg | ORAL_TABLET | Freq: Once | ORAL | Status: AC
  Administered 2023-09-20: 1000 mg via ORAL
  Filled 2023-09-20: qty 2

## 2023-09-20 MED ORDER — DIVALPROEX SODIUM 500 MG PO DR TAB
1000.0000 mg | DELAYED_RELEASE_TABLET | Freq: Once | ORAL | Status: AC
  Administered 2023-09-20: 1000 mg via ORAL
  Filled 2023-09-20: qty 2

## 2023-09-20 MED ORDER — SODIUM CHLORIDE 0.9 % IV BOLUS
500.0000 mL | Freq: Once | INTRAVENOUS | Status: AC
  Administered 2023-09-20: 500 mL via INTRAVENOUS

## 2023-09-20 NOTE — ED Provider Notes (Signed)
 [facility] Provider Note    Event Date/Time   First MD Initiated Contact with Patient 09/20/23 [phone]     (approximate)   History   Chief Complaint: Seizures   HPI  Daniel [name] is a 33 y.o. male with a history of CHF, atrial fibrillation on Eliquis , diabetes, obesity, obstructive sleep apnea, schizoaffective disorder who was sent to the ED from his group home due to suspected seizure this morning.  Patient reports being in bed and was told he might of had a seizure.  He does report increased staring spells lately without loss of consciousness tongue injury or convulsions.  Patient also notes that over the last 2 days he has had watery diarrhea, chills, body aches, decreased energy level.  Denies sick contacts.  No chest pain or shortness of breath.  Reports compliance with his medications.        Past Medical History:  Diagnosis Date   Cardiomyopathy [facility])    a.) TTE 08/04/2021: EF 50%; b.) TTE 10/22/2021: EF 45-50-%; c.) TTE 08/22/2022: EF 55-60%   Chest pain    CHF (congestive heart failure) (HCC)    a.) TTE 08/04/2021: EF 50%, mild LVH, RVSF norm; b.) TTE 10/22/2021: EF 45-50%, glob HK, RVSF norm; c.) TTE 08/22/2022: EF 55-60%, mild dil LV, RVSF norm.   Conversion disorder    Current use of long term anticoagulation    Dyspnea    GERD (gastroesophageal reflux disease)    Hepatic steatosis    Hyperlipidemia    Hypertension    Insomnia    a.) melatonin PRN   Long term current use of anticoagulant    a.) apixaban    OSA on CPAP    Persistent atrial fibrillation and flutter (HCC)    a.) CHA2DS2VASc = 5 (CHF, HTN, TIA x 2, T2DM);  b.) rate/rhythm maintained on oral diltiazem  + metoprolol  succinate; chronically anticoagulated with apixaban    Schizoaffective disorder (HCC)    Seizure disorder (HCC)    T2DM (type 2 diabetes mellitus) (HCC)    TIA (transient ischemic attack)    Urethral stricture    a. 08/2021 s/p cystoscopy and urethral  dilation.    Current Outpatient Rx   Order #: [id number] Class: Normal   Order #: [id number] Class: Normal   Order #: [id number] Class: Normal   Order #: [id number] Class: Normal   Order #: [id number] Class: Normal   Order #: [id number] Class: No Print   Order #: [id number] Class: Historical Med   Order #: [id number] Class: Normal   Order #: [id number] Class: Historical Med   Order #: [id number] Class: Historical Med   Order #: [id number] Class: Normal   Order #: [id number] Class: Normal   Order #: [id number] Class: No Print   Order #: [id number] Class: Normal   Order #: [id number] Class: Historical Med   Order #: [id number] Class: Normal   Order #: [id number] Class: Normal   Order #: [id number] Class: Historical Med   Order #: [id number] Class: Historical Med   Order #: [id number] Class: Historical Med   Order #: [id number] Class: Historical Med   Order #: [id number] Class: Historical Med   Order #: [id number] Class: Normal   Order #: [id number] Class: Historical Med   Order #: [id number] Class: Historical Med   Order #: [id number] Class: Normal   Order #: [id number] Class: Historical Med   Order #: [id number] Class: Normal   Order #: [id number] Class: Normal   Order #: [id number] Class: Normal   Order #: [id number] Class: Normal    Past Surgical History:  Procedure Laterality Date   CYSTOSCOPY WITH URETHRAL DILATATION N/A 09/07/2021   Procedure: CYSTOSCOPY WITH URETHRAL  DILATATION CATHETER PLACEMENT;  Surgeon: Geraline Knapp, MD;  Location: [facility] ORS;  Service: Urology;  Laterality: N/A;   CYSTOSCOPY WITH URETHRAL DILATATION N/A 09/06/2022   Procedure: CYSTOSCOPY WITH URETHRAL BALLOON DILATATION USING OPTILUME;  Surgeon: Geraline Knapp, MD;  Location: [facility] ORS;  Service: Urology;  Laterality: N/A;   RIGHT HEART CATH Right 10/07/2022   Procedure: RIGHT HEART CATH;  Surgeon: Darlis Eisenmenger, MD;  Location: [facility] INVASIVE CV LAB;  Service: Cardiovascular;  Laterality: Right;    Physical Exam   Triage Vital Signs: ED Triage Vitals   Encounter Vitals Group     BP 09/20/23 0813 122/82     Systolic BP Percentile --      Diastolic BP Percentile --      Pulse Rate 09/20/23 0812 83     Resp 09/20/23 0812 20     Temp 09/20/23 0812 98.1 F (36.7 C)     Temp Source 09/20/23 0812 Oral     SpO2 09/20/23 0812 100 %     Weight 09/20/23 0809 (!) 355 lb 6.4 oz (161.2 kg)     Height 09/20/23 0809 6' 3.5" (1.918 m)     Head Circumference --      Peak Flow --      Pain Score 09/20/23 0809 10     Pain Loc --      Pain Education --      Exclude from Growth Chart --     Most recent vital signs: Vitals:   09/20/23 0830 09/20/23 1015  BP: (!) 120/99 120/73  Pulse: 84 85  Resp: 17 20  Temp:    SpO2: 97% 97%    General: Awake, no distress.  CV:  Good peripheral perfusion.  Regular rate and rhythm Resp:  Normal effort.  Clear to auscultation bilaterally Abd:  No distention.  Soft, nontender Other:  No lower extremity edema.  Somewhat dry oral mucosa.  Normal motor function and sensation.  Cranial nerves III through XII intact.  Normal language and speech.  Normal orientation.  Normal coordination.  NIH stroke scale 0   ED Results / Procedures / Treatments   Labs (all labs ordered are listed, but only abnormal results are displayed) Labs Reviewed  COMPREHENSIVE METABOLIC PANEL WITH GFR - Abnormal; Notable for the following components:      Result Value   Sodium 134 (*)    Glucose, Bld 101 (*)    Calcium  8.8 (*)    All other components within normal limits  VALPROIC ACID  LEVEL - Abnormal; Notable for the following components:   Valproic Acid  Lvl 31 (*)    All other components within normal limits  CBC WITH DIFFERENTIAL/PLATELET  LITHIUM  LEVEL  TROPONIN I (HIGH SENSITIVITY)     EKG Interpreted by me Sinus rhythm rate of 86.  Normal axis, normal intervals.  Normal QRS ST segments and T waves.   RADIOLOGY Chest x-ray interpreted by me, appears unremarkable.  Radiology report  reviewed    PROCEDURES:  Procedures   MEDICATIONS ORDERED IN ED: Medications  divalproex  (DEPAKOTE ) DR tablet 1,000 mg (has no administration in time range)  sodium chloride  0.9 % bolus 500 mL (0 mLs Intravenous Stopped 09/20/23 1023)  acetaminophen  (TYLENOL ) tablet 1,000 mg (1,000 mg Oral Given 09/20/23 0854)     IMPRESSION / MDM / ASSESSMENT AND PLAN / ED COURSE  I reviewed the triage vital signs and the nursing notes.  DDx: Viral illness, dehydration, AKI, electrolyte derangement, epilepsy, nontherapeutic lithium /Depakote  levels, malingering  Patient's presentation is most consistent with acute presentation with potential threat to life or bodily function.  Patient comes to the ED with concern for a seizure this morning.  He does have a seizure disorder and describes recent symptoms suggestive of a viral illness.  With his comorbidities, will obtain labs.  He does appear to be mildly dehydrated, will give IV fluids.  He is a frequent visitor to this emergency department, and today also has multiple additional trivial complaints and requests, suggesting a degree of secondary agenda.   ----------------------------------------- 10:38 AM on 09/20/2023 ----------------------------------------- Labs reassuring, Depakote  level slightly low at 40, has not had his daily morning dose yet today.  Will give him his 1000 mg Depakote  orally, stable for discharge.      FINAL CLINICAL IMPRESSION(S) / ED DIAGNOSES   Final diagnoses:  Altered mental status, unspecified altered mental status type  Type 2 diabetes mellitus without complication, without long-term current use of insulin  (HCC)  Chronic congestive heart failure, unspecified heart failure type (HCC)  Morbid obesity (HCC)  Bipolar affective disorder, remission status unspecified (HCC)     Rx / DC Orders   ED Discharge Orders     None        Note:  This document was prepared using Dragon voice recognition software and  may include unintentional dictation errors.   Jacquie Maudlin, MD 09/20/23 1049

## 2023-09-20 NOTE — ED Notes (Signed)
 Staff at behavioral health center notified of pt discharge. They verbalized they would send someone to pick him up.

## 2023-09-20 NOTE — Discharge Instructions (Signed)
 Please make a follow up appointment with Neurology for further evaluation of today's episode, and continue taking all medications as usual.

## 2023-09-20 NOTE — ED Triage Notes (Signed)
 Pt arrives via [facility] from home for seizures. Upon EMS arrival, pt was in bed and somnolent, maintaining his own airway. Pt was incontinent. Per EMS, pt has been axox4 the entire time. Pt reports compliance with depakote . Pt reports his "normal" seizure presents as L side weakness and staring off into space.

## 2023-10-02 ENCOUNTER — Emergency Department: Payer: MEDICAID

## 2023-10-02 ENCOUNTER — Encounter: Payer: Self-pay | Admitting: Emergency Medicine

## 2023-10-02 ENCOUNTER — Other Ambulatory Visit: Payer: Self-pay

## 2023-10-02 ENCOUNTER — Emergency Department
Admission: EM | Admit: 2023-10-02 | Discharge: 2023-10-16 | Disposition: A | Discharge status: Home / Self Care | Payer: MEDICAID | Attending: Emergency Medicine | Admitting: Emergency Medicine

## 2023-10-02 DIAGNOSIS — E119 Type 2 diabetes mellitus without complications: Secondary | ICD-10-CM | POA: Diagnosis not present

## 2023-10-02 DIAGNOSIS — I110 Hypertensive heart disease with heart failure: Secondary | ICD-10-CM | POA: Insufficient documentation

## 2023-10-02 DIAGNOSIS — R2243 Localized swelling, mass and lump, lower limb, bilateral: Secondary | ICD-10-CM | POA: Diagnosis not present

## 2023-10-02 DIAGNOSIS — F319 Bipolar disorder, unspecified: Secondary | ICD-10-CM

## 2023-10-02 DIAGNOSIS — I509 Heart failure, unspecified: Secondary | ICD-10-CM | POA: Insufficient documentation

## 2023-10-02 DIAGNOSIS — J4530 Mild persistent asthma, uncomplicated: Secondary | ICD-10-CM

## 2023-10-02 DIAGNOSIS — Z7901 Long term (current) use of anticoagulants: Secondary | ICD-10-CM | POA: Diagnosis not present

## 2023-10-02 DIAGNOSIS — R0789 Other chest pain: Secondary | ICD-10-CM | POA: Diagnosis present

## 2023-10-02 DIAGNOSIS — M7989 Other specified soft tissue disorders: Secondary | ICD-10-CM

## 2023-10-02 LAB — CBC
HCT: 38.7 % — ABNORMAL LOW (ref 39.0–52.0)
Hemoglobin: 12.6 g/dL — ABNORMAL LOW (ref 13.0–17.0)
MCH: 28.6 pg (ref 26.0–34.0)
MCHC: 32.6 g/dL (ref 30.0–36.0)
MCV: 88 fL (ref 80.0–100.0)
Platelets: 238 10*3/uL (ref 150–400)
RBC: 4.4 MIL/uL (ref 4.22–5.81)
RDW: 13.2 % (ref 11.5–15.5)
WBC: 7.8 10*3/uL (ref 4.0–10.5)
nRBC: 0 % (ref 0.0–0.2)

## 2023-10-02 LAB — BASIC METABOLIC PANEL WITH GFR
Anion gap: 12 (ref 5–15)
BUN: 19 mg/dL (ref 6–20)
CO2: 28 mmol/L (ref 22–32)
Calcium: 9.3 mg/dL (ref 8.9–10.3)
Chloride: 95 mmol/L — ABNORMAL LOW (ref 98–111)
Creatinine, Ser: 1.1 mg/dL (ref 0.61–1.24)
GFR, Estimated: >60 mL/min (ref >60)
Glucose, Bld: 97 mg/dL (ref 70–99)
Potassium: 3.9 mmol/L (ref 3.5–5.1)
Sodium: 135 mmol/L (ref 135–145)

## 2023-10-02 LAB — URINALYSIS, W/ REFLEX TO CULTURE (INFECTION SUSPECTED)
Bilirubin Urine: NEGATIVE
Glucose, UA: >=500 mg/dL — AB
Hgb urine dipstick: NEGATIVE
Ketones, ur: NEGATIVE mg/dL
Nitrite: NEGATIVE
Protein, ur: NEGATIVE mg/dL
Specific Gravity, Urine: 1.005 (ref 1.005–1.030)
pH: 7 (ref 5.0–8.0)

## 2023-10-02 LAB — TROPONIN I (HIGH SENSITIVITY): Troponin I (High Sensitivity): <2 ng/L (ref <18)

## 2023-10-02 NOTE — ED Triage Notes (Signed)
 Patient to ED via POV from group home for centralized CP that started this AM. Pt reports it radiates into left arm and jaw. States he also has been having difficulty urinating. Pt reports vomiting x1 since arrival. Hx CHF

## 2023-10-02 NOTE — ED Provider Notes (Signed)
 [facility] Provider Note    Event Date/Time   First MD Initiated Contact with Patient 10/02/23 2343     (approximate)   History   Chief Complaint Chest Pain   HPI  Daniel [name] is a 33 y.o. male with past medical history of hypertension, hyperlipidemia, diabetes, atrial fibrillation on Eliquis , CHF, seizures, and schizoaffective disorder who presents to the ED complaining of chest pain.  Patient reports that he has had sharp pain in the center of his chest since this morning, which is worse when he takes a deep breath.  He denies any associated fevers, cough, or shortness of breath.  He does report that he has been feeling nauseous with some upper abdominal pain, nausea, and vomiting.  He also states that his legs have been swollen despite taking his diuretic medication and wearing compression stockings.  He believes the left leg is slightly more swollen than the right, reports some pain in his left calf.     Physical Exam   Triage Vital Signs: ED Triage Vitals  Encounter Vitals Group     BP 10/02/23 1655 (!) 110/50     Systolic BP Percentile --      Diastolic BP Percentile --      Pulse Rate 10/02/23 1655 87     Resp 10/02/23 1655 17     Temp 10/02/23 1655 98.8 F (37.1 C)     Temp Source 10/02/23 1655 Oral     SpO2 10/02/23 1655 92 %     Weight 10/02/23 1654 (!) 354 lb 15.1 oz (161 kg)     Height 10/02/23 1654 6' 3.5" (1.918 m)     Head Circumference --      Peak Flow --      Pain Score 10/02/23 1654 10     Pain Loc --      Pain Education --      Exclude from Growth Chart --     Most recent vital signs: Vitals:   10/02/23 2008 10/03/23 0037  BP: (!) 113/93 97/68  Pulse: 82 79  Resp: 18 18  Temp: 98.7 F (37.1 C) 98.4 F (36.9 C)  SpO2: 97% 97%    Constitutional: Alert and oriented. Eyes: Conjunctivae are normal. Head: Atraumatic. Nose: No congestion/rhinnorhea. Mouth/Throat: Mucous membranes are moist.  Cardiovascular:  Normal rate, regular rhythm. Grossly normal heart sounds.  2+ radial and DP pulses bilaterally. Respiratory: Normal respiratory effort.  No retractions. Lungs CTAB. Gastrointestinal: Soft and nontender. No distention. Musculoskeletal: 1+ pitting edema to bilateral lower extremities, no associated erythema or warmth. Neurologic:  Normal speech and language. No gross focal neurologic deficits are appreciated.    ED Results / Procedures / Treatments   Labs (all labs ordered are listed, but only abnormal results are displayed) Labs Reviewed  BASIC METABOLIC PANEL WITH GFR - Abnormal; Notable for the following components:      Result Value   Chloride 95 (*)    All other components within normal limits  CBC - Abnormal; Notable for the following components:   Hemoglobin 12.6 (*)    HCT 38.7 (*)    All other components within normal limits  URINALYSIS, W/ REFLEX TO CULTURE (INFECTION SUSPECTED) - Abnormal; Notable for the following components:   Color, Urine STRAW (*)    APPearance CLEAR (*)    Glucose, UA >=500 (*)    Leukocytes,Ua MODERATE (*)    Bacteria, UA RARE (*)    All other components within normal limits  URINE CULTURE  D-DIMER, QUANTITATIVE  HEPATIC FUNCTION PANEL  LIPASE, BLOOD  TROPONIN I (HIGH SENSITIVITY)  TROPONIN I (HIGH SENSITIVITY)     EKG  ED ECG REPORT I, Twilla Galea, the attending physician, personally viewed and interpreted this ECG.   Date: 10/02/2023  EKG Time: 16:54  Rate: 85  Rhythm: normal sinus rhythm  Axis: Normal  Intervals:none  ST&T Change: Nonspecific T wave abnormality, similar to previous  RADIOLOGY Chest x-ray reviewed and interpreted by me with no infiltrate, edema, or effusion.  PROCEDURES:  Critical Care performed: No  Procedures   MEDICATIONS ORDERED IN ED: Medications  furosemide  (LASIX ) injection 40 mg (has no administration in time range)  ondansetron  (ZOFRAN ) injection 4 mg (4 mg Intravenous Given 10/03/23 0028)   morphine  (PF) 4 MG/ML injection 4 mg (4 mg Intravenous Given 10/03/23 0028)     IMPRESSION / MDM / ASSESSMENT AND PLAN / ED COURSE  I reviewed the triage vital signs and the nursing notes.                              33 y.o. male with past medical history of hypertension, hyperlipidemia, diabetes, atrial fibrillation on Eliquis , CHF, seizures, and schizophrenia who presents to the ED complaining of sharp pain in his chest since this morning as well as swelling in his legs, nausea, and vomiting.  Patient's presentation is most consistent with acute presentation with potential threat to life or bodily function.  Differential diagnosis includes, but is not limited to, ACS, PE, pneumonia, pneumothorax, musculoskeletal pain, GERD, anxiety, CHF exacerbation, DVT, pancreatitis, hepatitis, gastritis, gastroenteritis.  Patient nontoxic-appearing and in no acute distress, vital signs are unremarkable.  EKG shows no evidence of arrhythmia or ischemia and symptoms seem atypical for ACS, initial troponin within normal limits.  We will check D-dimer given his leg swelling and discomfort along with pleuritic chest pain.  Chest x-ray is unremarkable and remainder of labs without significant anemia, leukocytosis, electrolyte abnormality, or AKI.  We will add on LFTs and lipase, but he has a benign abdominal exam.  Plan to treat symptomatically with IV morphine  and Zofran , reassess following additional testing.  D-dimer within normal limits, repeat troponin is also reassuring and I doubt cardiac etiology for his symptoms.  LFTs and lipase are unremarkable, patient tolerating oral intake without difficulty following dose of Zofran .  We will give dose of IV Lasix  given his peripheral edema, but no signs of significant CHF exacerbation at this time.  Patient is appropriate for discharge home with outpatient follow-up, was counseled to return to the ED for new or worsening symptoms.  Patient agrees with plan.       FINAL CLINICAL IMPRESSION(S) / ED DIAGNOSES   Final diagnoses:  Atypical chest pain  Leg swelling     Rx / DC Orders   ED Discharge Orders     None        Note:  This document was prepared using Dragon voice recognition software and may include unintentional dictation errors.   Twilla Galea, MD 10/03/23 [phone]

## 2023-10-03 LAB — HEPATIC FUNCTION PANEL
ALT: 25 U/L (ref 0–44)
AST: 21 U/L (ref 15–41)
Albumin: 3.9 g/dL (ref 3.5–5.0)
Alkaline Phosphatase: 60 U/L (ref 38–126)
Bilirubin, Direct: <0.1 mg/dL (ref 0.0–0.2)
Total Bilirubin: 0.9 mg/dL (ref 0.0–1.2)
Total Protein: 7 g/dL (ref 6.5–8.1)

## 2023-10-03 LAB — TROPONIN I (HIGH SENSITIVITY): Troponin I (High Sensitivity): <2 ng/L (ref <18)

## 2023-10-03 LAB — D-DIMER, QUANTITATIVE: D-Dimer, Quant: <0.27 ug{FEU}/mL (ref 0.00–0.50)

## 2023-10-03 LAB — LIPASE, BLOOD: Lipase: 29 U/L (ref 11–51)

## 2023-10-03 MED ORDER — ONDANSETRON HCL 4 MG/2ML IJ SOLN
4.0000 mg | Freq: Once | INTRAMUSCULAR | Status: AC
Start: 2023-10-03 — End: 2023-10-03
  Administered 2023-10-03: 4 mg via INTRAVENOUS
  Filled 2023-10-03: qty 2

## 2023-10-03 MED ORDER — MORPHINE SULFATE (PF) 4 MG/ML IV SOLN
4.0000 mg | Freq: Once | INTRAVENOUS | Status: AC
  Administered 2023-10-03: 4 mg via INTRAVENOUS
  Filled 2023-10-03: qty 1

## 2023-10-03 MED ORDER — ACETAMINOPHEN 500 MG PO TABS
1000.0000 mg | ORAL_TABLET | Freq: Once | ORAL | Status: AC
  Administered 2023-10-03: 1000 mg via ORAL
  Filled 2023-10-03: qty 2

## 2023-10-03 MED ORDER — FUROSEMIDE 10 MG/ML IJ SOLN
40.0000 mg | Freq: Once | INTRAMUSCULAR | Status: AC
  Administered 2023-10-03: 40 mg via INTRAVENOUS
  Filled 2023-10-03: qty 4

## 2023-10-03 NOTE — ED Notes (Signed)
 This Clinical research associate spoke with Neil Balls who told writer pt is no longer staying with him at [facility] on [address] in [place], [place]. Per Neil Balls pt's last day was Sunday 10/01/2023.   Writer then called pt's caregiver [name] at [phone] to infer about this information.   Charge RN notified of this and following up.

## 2023-10-03 NOTE — ED Notes (Addendum)
 This RN attempted to contact Daniel [name] and Daniel [name] again without success. Voicemail left at this time.

## 2023-10-03 NOTE — Progress Notes (Signed)
   10/03/23 1345  Spiritual Encounters  Type of Visit Initial;Follow up  Care provided to: Patient  Reason for visit Routine spiritual support  OnCall Visit No   Chaplain followed up with patient by bringing coloring pages as requested.  Chaplain inquired about patient's condition and what he holds sacred.  Patient shared regarding some familial issues and reiterated concerns about the bed he's in and his transition of care.  Chaplain shared the visit with staff and offered prayer to the patient.    Rev. Rana M. Nolon Baxter, M.Div. Chaplain Resident [facility]

## 2023-10-03 NOTE — ED Notes (Signed)
 This RN gave report to Wilton Hasting RN and performed care handoff. Call light in reach, bed wheels locked, side rail raised, pt updated on plan of care. Rounding completed.

## 2023-10-03 NOTE — Progress Notes (Signed)
   10/03/23 1100  Spiritual Encounters  Type of Visit Initial  Care provided to: Patient  Conversation partners present during encounter Nurse  Reason for visit Routine spiritual support  OnCall Visit No   Chaplain met patient while rounding in the ED.  Chaplain offered a compassionate presence and reflective listening.  Patient shared that he was concerned about his housing/placement.  Chaplain encouraged patient to speak to Case Worker/Social Worker.  Chaplain assessed that patient feels joyful when making jewelry and doing adult coloring.  Chaplain will see about printing off pages for the patient to color on and will return later.    Rev. Rana M. Nolon Baxter, M.Div. Chaplain Resident [facility]

## 2023-10-03 NOTE — ED Notes (Signed)
 Voicemails left for Autry Legions and Margurette Shillings and well as [name] (legal guardian) to notify them of patients discharge and a need for a ride home.

## 2023-10-03 NOTE — ED Notes (Signed)
Pharmacy called for medication reconciliation.  

## 2023-10-03 NOTE — ED Notes (Signed)
 Called group home left message to pick up patient

## 2023-10-03 NOTE — ED Notes (Addendum)
 This RN attempted to contact the patients legal guardian as well as Athena Bland and Neil Balls again to notify them of patients discharge with no luck. Voicemails left at this time.

## 2023-10-03 NOTE — ED Notes (Signed)
 Patient given shower/hygiene supplies, 2 gowns and nonslip socks per pt request.

## 2023-10-03 NOTE — ED Provider Notes (Signed)
-----------------------------------------   9:16 AM on 10/03/2023 -----------------------------------------   Blood pressure 118/72, pulse 68, temperature 97.8 F (36.6 C), temperature source Oral, resp. rate 18, height 6' 3.5" (1.918 m), weight (!) 161 kg, SpO2 94%.  The patient is calm and cooperative at this time.  Patient initially seen for chest pain symptoms in the ED.  Plan was for discharge by overnight provider.  He was at a group home and they reached out to legal guardian as well as group home.  Supposedly his insurance is running out this Sunday and they cannot accept him back to the group home.  He has no safe disposition home.  Will consult TOC for help with placement.   Kandee Orion, MD 10/03/23 [phone]

## 2023-10-03 NOTE — ED Notes (Signed)
 Pt given coloring pages as well as sprite and chocolate milk.

## 2023-10-03 NOTE — ED Notes (Addendum)
 Charge RN informed by pts primary RN that Daniel [name] from the patients group home stated that Daniel [name] would not be coming back there and his last day there was Sunday. Primary RN also stated that she spoke with pts legal guardian, Daniel [name], who stated she was not aware of this.  This RN spoke with Daniel [name] and he stated that they were informed in January that patient's last day would be 10/02/23 due to insurance. Daniel [name] stated that Daniel [name] from [facility] informed the group home and the legal guardian of this information. This RN asked if he knew where the Daniel [name] was supposed to be going and he stated that he did not.  Dr. Karlynn Oyster was informed of the above and this RN requested [facility] consult to assist in determining pts disposition and also requested pts home meds be ordered.  [facility] contact- Daniel [name]- [phone]

## 2023-10-03 NOTE — ED Notes (Signed)
Pt given chocolate ice cream

## 2023-10-03 NOTE — ED Notes (Signed)
 This RN received report from Steffi Edu RN and performed care handoff. This RN introduced self to pt. Bed wheels locked, pt close to nurse's station, side rails raised, pt updated about attempting to contact caregiver for discharge. Pt currently coloring. Rounding completed.

## 2023-10-03 NOTE — ED Notes (Signed)
 Pt given lunch and beverage.

## 2023-10-04 LAB — URINE CULTURE

## 2023-10-04 MED ORDER — ATORVASTATIN CALCIUM 20 MG PO TABS
80.0000 mg | ORAL_TABLET | Freq: Every day | ORAL | Status: DC
  Administered 2023-10-04 – 2023-10-16 (×13): 80 mg via ORAL
  Filled 2023-10-04 (×13): qty 4

## 2023-10-04 MED ORDER — HYDROXYZINE HCL 25 MG PO TABS
25.0000 mg | ORAL_TABLET | Freq: Every day | ORAL | Status: DC
  Administered 2023-10-04: 25 mg via ORAL
  Filled 2023-10-04: qty 1

## 2023-10-04 MED ORDER — ALLOPURINOL 300 MG PO TABS
300.0000 mg | ORAL_TABLET | Freq: Every day | ORAL | Status: DC
  Administered 2023-10-04 – 2023-10-16 (×12): 300 mg via ORAL
  Filled 2023-10-04 (×13): qty 1

## 2023-10-04 MED ORDER — DIVALPROEX SODIUM 500 MG PO DR TAB
1000.0000 mg | DELAYED_RELEASE_TABLET | Freq: Two times a day (BID) | ORAL | Status: DC
  Administered 2023-10-04 – 2023-10-16 (×24): 1000 mg via ORAL
  Filled 2023-10-04 (×25): qty 2

## 2023-10-04 MED ORDER — DAPAGLIFLOZIN PROPANEDIOL 10 MG PO TABS
10.0000 mg | ORAL_TABLET | Freq: Every day | ORAL | Status: DC
  Administered 2023-10-04 – 2023-10-16 (×13): 10 mg via ORAL
  Filled 2023-10-04 (×14): qty 1

## 2023-10-04 MED ORDER — SACUBITRIL-VALSARTAN 24-26 MG PO TABS
1.0000 | ORAL_TABLET | Freq: Two times a day (BID) | ORAL | Status: DC
  Administered 2023-10-04 – 2023-10-16 (×24): 1 via ORAL
  Filled 2023-10-04 (×27): qty 1

## 2023-10-04 MED ORDER — BENZTROPINE MESYLATE 1 MG PO TABS
1.0000 mg | ORAL_TABLET | Freq: Three times a day (TID) | ORAL | Status: DC
  Administered 2023-10-04 – 2023-10-16 (×37): 1 mg via ORAL
  Filled 2023-10-04 (×37): qty 1

## 2023-10-04 MED ORDER — PALIPERIDONE ER 3 MG PO TB24
9.0000 mg | ORAL_TABLET | Freq: Every morning | ORAL | Status: DC
  Administered 2023-10-04 – 2023-10-16 (×13): 9 mg via ORAL
  Filled 2023-10-04 (×14): qty 3

## 2023-10-04 MED ORDER — FUROSEMIDE 40 MG PO TABS
20.0000 mg | ORAL_TABLET | Freq: Every day | ORAL | Status: DC
  Administered 2023-10-04 – 2023-10-16 (×13): 20 mg via ORAL
  Filled 2023-10-04 (×13): qty 1

## 2023-10-04 MED ORDER — ALBUTEROL SULFATE (2.5 MG/3ML) 0.083% IN NEBU
2.5000 mg | INHALATION_SOLUTION | Freq: Four times a day (QID) | RESPIRATORY_TRACT | Status: DC | PRN
  Administered 2023-10-04 – 2023-10-12 (×2): 2.5 mg via RESPIRATORY_TRACT
  Filled 2023-10-04 (×2): qty 3

## 2023-10-04 MED ORDER — FLUTICASONE FUROATE-VILANTEROL 200-25 MCG/ACT IN AEPB
1.0000 | INHALATION_SPRAY | Freq: Every day | RESPIRATORY_TRACT | Status: DC
  Administered 2023-10-05 – 2023-10-16 (×10): 1 via RESPIRATORY_TRACT
  Filled 2023-10-04: qty 28

## 2023-10-04 MED ORDER — HYDROXYZINE HCL 25 MG PO TABS
25.0000 mg | ORAL_TABLET | Freq: Three times a day (TID) | ORAL | Status: DC | PRN
  Administered 2023-10-04 – 2023-10-15 (×7): 25 mg via ORAL
  Filled 2023-10-04 (×7): qty 1

## 2023-10-04 MED ORDER — METOPROLOL SUCCINATE ER 50 MG PO TB24
150.0000 mg | ORAL_TABLET | Freq: Every day | ORAL | Status: DC
  Administered 2023-10-04 – 2023-10-16 (×13): 150 mg via ORAL
  Filled 2023-10-04 (×13): qty 3

## 2023-10-04 MED ORDER — ESCITALOPRAM OXALATE 10 MG PO TABS
20.0000 mg | ORAL_TABLET | Freq: Every day | ORAL | Status: DC
  Administered 2023-10-04 – 2023-10-16 (×13): 20 mg via ORAL
  Filled 2023-10-04 (×13): qty 2

## 2023-10-04 MED ORDER — DILTIAZEM HCL ER COATED BEADS 180 MG PO CP24
360.0000 mg | ORAL_CAPSULE | Freq: Every day | ORAL | Status: DC
  Administered 2023-10-04: 360 mg via ORAL
  Filled 2023-10-04 (×2): qty 2

## 2023-10-04 MED ORDER — LITHIUM CARBONATE ER 450 MG PO TBCR
900.0000 mg | EXTENDED_RELEASE_TABLET | Freq: Every evening | ORAL | Status: DC
  Filled 2023-10-04: qty 2

## 2023-10-04 MED ORDER — LITHIUM CARBONATE ER 300 MG PO TBCR
600.0000 mg | EXTENDED_RELEASE_TABLET | Freq: Every evening | ORAL | Status: DC
  Administered 2023-10-04 – 2023-10-15 (×12): 600 mg via ORAL
  Filled 2023-10-04 (×14): qty 2

## 2023-10-04 MED ORDER — LITHIUM CARBONATE ER 300 MG PO TBCR
300.0000 mg | EXTENDED_RELEASE_TABLET | Freq: Every morning | ORAL | Status: DC
  Administered 2023-10-05 – 2023-10-16 (×12): 300 mg via ORAL
  Filled 2023-10-04 (×14): qty 1

## 2023-10-04 MED ORDER — BACLOFEN 10 MG PO TABS
10.0000 mg | ORAL_TABLET | Freq: Two times a day (BID) | ORAL | Status: DC
  Administered 2023-10-04 – 2023-10-16 (×25): 10 mg via ORAL
  Filled 2023-10-04 (×25): qty 1

## 2023-10-04 MED ORDER — MELATONIN 5 MG PO TABS
5.0000 mg | ORAL_TABLET | Freq: Every day | ORAL | Status: DC
  Administered 2023-10-04 – 2023-10-15 (×12): 5 mg via ORAL
  Filled 2023-10-04 (×12): qty 1

## 2023-10-04 MED ORDER — APIXABAN 5 MG PO TABS
5.0000 mg | ORAL_TABLET | Freq: Two times a day (BID) | ORAL | Status: DC
  Administered 2023-10-04 – 2023-10-16 (×25): 5 mg via ORAL
  Filled 2023-10-04 (×25): qty 1

## 2023-10-04 MED ORDER — ACETAMINOPHEN 325 MG PO TABS
650.0000 mg | ORAL_TABLET | ORAL | Status: DC | PRN
  Administered 2023-10-05 – 2023-10-14 (×8): 650 mg via ORAL
  Filled 2023-10-04 (×9): qty 2

## 2023-10-04 NOTE — ED Notes (Signed)
 Pt provided with medications and beverage. Pt then assisted in being changed into 2 gowns. Respiratory called and assisted in setting up pts CPAP machine for the night.

## 2023-10-04 NOTE — ED Notes (Signed)
 Rn on phone with Shelvy Dickens with [facility] stating pt has placement at [facility]. RN gave Shelvy Dickens information for SW to get paperwork taken care of.   Shelvy Dickens [phone]

## 2023-10-04 NOTE — ED Provider Notes (Signed)
 Emergency Medicine Observation Re-evaluation Note  Daniel [name] is a 33 y.o. male, seen on rounds today.  Pt initially presented to the ED for complaints of Chest Pain  Currently, the patient is calm, no acute complaints.  Physical Exam  Blood pressure 118/72, pulse 68, temperature 97.8 F (36.6 C), temperature source Oral, resp. rate 18, height 6' 3.5" (1.918 m), weight (!) 161 kg, SpO2 94%. Physical Exam General: NAD Lungs: CTAB Psych: not agitated  ED Course / MDM  EKG:    I have reviewed the labs performed to date as well as medications administered while in observation.  Recent changes in the last 24 hours include no acute events overnight.  Home meds ordered by overnight team. Pt requests nighttime CPAP - ordered  Plan  Current plan is for TOC placement.   Jacquie Maudlin, MD 10/04/23 [phone]

## 2023-10-04 NOTE — Progress Notes (Signed)
   10/04/23 1715  Spiritual Encounters  Type of Visit Follow up  Care provided to: Patient  Conversation partners present during encounter Nurse  Reason for visit Routine spiritual support  OnCall Visit No   Chaplain visited patient while on the Unit.  Patient was happy to see Chaplain and excited about having a bed in a room.  Patient shared about his jewelry, grandmother and experiences related to his health.  Chaplain offered a compassionate presence and reflective listening.     Rev. Rana M. Nolon Baxter, M.Div. Chaplain Resident [facility]

## 2023-10-04 NOTE — ED Notes (Signed)
 Pending New Group Home Placement

## 2023-10-04 NOTE — ED Notes (Signed)
Patient given phone to use 

## 2023-10-04 NOTE — ED Notes (Signed)
 TOC called and TOC representative will come evaluate pt at 10

## 2023-10-04 NOTE — ED Notes (Signed)
 Chaplain at bedside

## 2023-10-04 NOTE — ED Notes (Signed)
 TOC

## 2023-10-04 NOTE — ED Notes (Signed)
 Pt reporting to ED d/t CP and was removed from group home after complication with insurance. Pt awaiting placement now. Pt CP controlled. Pt ABCs intact. RR even and unlabored. Pt in NAD. Bed in lowest locked position. Call bell in reach. Denies needs at this time.   Past Medical History:  Diagnosis Date   Cardiomyopathy [facility])    a.) TTE 08/04/2021: EF 50%; b.) TTE 10/22/2021: EF 45-50-%; c.) TTE 08/22/2022: EF 55-60%   Chest pain    CHF (congestive heart failure) (HCC)    a.) TTE 08/04/2021: EF 50%, mild LVH, RVSF norm; b.) TTE 10/22/2021: EF 45-50%, glob HK, RVSF norm; c.) TTE 08/22/2022: EF 55-60%, mild dil LV, RVSF norm.   Conversion disorder    Current use of long term anticoagulation    Dyspnea    GERD (gastroesophageal reflux disease)    Hepatic steatosis    Hyperlipidemia    Hypertension    Insomnia    a.) melatonin PRN   Long term current use of anticoagulant    a.) apixaban    OSA on CPAP    Persistent atrial fibrillation and flutter (HCC)    a.) CHA2DS2VASc = 5 (CHF, HTN, TIA x 2, T2DM);  b.) rate/rhythm maintained on oral diltiazem  + metoprolol  succinate; chronically anticoagulated with apixaban    Schizoaffective disorder (HCC)    Seizure disorder (HCC)    T2DM (type 2 diabetes mellitus) (HCC)    TIA (transient ischemic attack)    Urethral stricture    a. 08/2021 s/p cystoscopy and urethral dilation.

## 2023-10-04 NOTE — ED Notes (Signed)
 Pt refused dinner tray after seeing what it was. Pt then provided with snacks, ice cream, and beverage. Pt denies further needs.

## 2023-10-04 NOTE — TOC Progression Note (Signed)
 Transition of Care [facility]) - Progression Note    Patient Details  Name: Haig Sela MRN: [id number] Date of Birth: 1990-11-30  Transition of Care [facility] CM/SW Contact  Joslyn Nim, RN Phone Number: 10/04/2023, 10:23 AM  Clinical Narrative:    Cm received a call from Peyton Brash from [facility]. She states that they have found a facility for patient.  She states that the name of the facility is [facility]. She was unable to gave CM a time and date for patient to go to the facility. CM express that patient is not admitted to the hospital and  and medically ready for d/c.  She plan to email CM the information.        Expected Discharge Plan and Services                                               Social Determinants of Health (SDOH) Interventions SDOH Screenings   Food Insecurity: No Food Insecurity (09/01/2020)   Received from [facility], [facility]  Depression [phone]): Low Risk  (05/17/2023)  Financial Resource Strain: Low Risk  (03/16/2021)   Received from [facility], [facility]  Social Connections: Unknown (09/27/2021)   [facility]  Stress: No Stress Concern Present (03/16/2021)   [facility]  Tobacco Use: Medium Risk (10/02/2023)    Readmission Risk Interventions    09/06/2021    9:33 AM  Readmission Risk Prevention Plan  Transportation Screening Complete  PCP or Specialist Appt within 3-5 Days Complete  HRI or Home Care Consult Complete  Social Work Consult for Recovery Care Planning/Counseling Not Complete  SW consult not completed comments RNCM assigned to case  Palliative Care Screening Not Applicable  Medication Review [employer]) Complete

## 2023-10-05 ENCOUNTER — Emergency Department: Payer: MEDICAID

## 2023-10-05 LAB — URINALYSIS, W/ REFLEX TO CULTURE (INFECTION SUSPECTED)
Bacteria, UA: NONE SEEN
Bilirubin Urine: NEGATIVE
Glucose, UA: >=500 mg/dL — AB
Hgb urine dipstick: NEGATIVE
Ketones, ur: NEGATIVE mg/dL
Nitrite: NEGATIVE
Protein, ur: NEGATIVE mg/dL
Specific Gravity, Urine: 1.012 (ref 1.005–1.030)
pH: 6 (ref 5.0–8.0)

## 2023-10-05 MED ORDER — DILTIAZEM HCL ER COATED BEADS 120 MG PO CP24
360.0000 mg | ORAL_CAPSULE | Freq: Every day | ORAL | Status: DC
  Administered 2023-10-05 – 2023-10-16 (×11): 360 mg via ORAL
  Filled 2023-10-05 (×12): qty 3

## 2023-10-05 MED ORDER — OXYCODONE-ACETAMINOPHEN 5-325 MG PO TABS
1.0000 | ORAL_TABLET | Freq: Once | ORAL | Status: AC
  Administered 2023-10-05: 1 via ORAL
  Filled 2023-10-05: qty 1

## 2023-10-05 MED ORDER — IOHEXOL 350 MG/ML SOLN
75.0000 mL | Freq: Once | INTRAVENOUS | Status: AC | PRN
  Administered 2023-10-05: 75 mL via INTRAVENOUS

## 2023-10-05 MED ORDER — ONDANSETRON 4 MG PO TBDP
4.0000 mg | ORAL_TABLET | Freq: Once | ORAL | Status: AC
  Administered 2023-10-05: 4 mg via ORAL
  Filled 2023-10-05: qty 1

## 2023-10-05 NOTE — ED Provider Notes (Signed)
-----------------------------------------   11:02 PM on 10/05/2023 -----------------------------------------   Blood pressure (!) 117/58, pulse 71, temperature 97.8 F (36.6 C), temperature source Oral, resp. rate 18, height 6' 3.5" (1.918 m), weight (!) 161 kg, SpO2 95%.  Called the patient's room after reported onset of numbness and weakness to his right sided extremities.  On patient's chart review, he has prior history of conversion disorder and nonepileptic seizures.  He has had multiple workups in the past for one-sided extremity weakness which have been negative.  He is on Eliquis .  Patient would not be a candidate for TNK but will get CTA head and neck with and without contrast to evaluate for obvious CVA or large vessel occlusion.  Notified oncoming provider to follow-up results.   Kandee Orion, MD 10/05/23 [phone]

## 2023-10-05 NOTE — ED Notes (Signed)
 Pt complaining of right sided numbness and tingling. Weakness on right side. States blurred vision in right eye. MD made aware. States hold oral medications at this time.

## 2023-10-05 NOTE — ED Notes (Signed)
 Pt pressing call bell, pt vomited into the toilet. Reports that his "stricture in my bladder is coming back" reports pain in his bladder. Dr. Karlynn Oyster EDP made aware. See new orders.

## 2023-10-05 NOTE — ED Notes (Signed)
 Pt back to sleeping peacefully in bed. Urine sample collected and sent to lab. Pt ABCs intact. RR even and unlabored. Pt in NAD. Bed in lowest locked position.

## 2023-10-05 NOTE — ED Notes (Signed)
 Pt sleeping peacefully in bed at this time. Pt ABCs intact. RR even and unlabored. CPAP taken off.  Pt in NAD. Bed in lowest locked position with bed alarms set.

## 2023-10-05 NOTE — ED Notes (Signed)
 Pt sleeping peacefully in bed at this time. Pt ABCs intact. RR even and unlabored. Pt in NAD. Bed in lowest locked position with bed alarms set.

## 2023-10-05 NOTE — ED Provider Notes (Signed)
 [facility] Observation Note   ----------------------------------------- 12:35 PM on 10/05/2023 -----------------------------------------  Daniel [name] is a 33 y.o. male currently boarding in the Emergency Department.  No acute events since last update.  Recent Vitals   Most recent vital signs: Vitals:   10/05/23 0847 10/05/23 0915  BP: (!) 117/58 (!) 117/58  Pulse: 71 71  Resp: 18   Temp: 97.8 F (36.6 C)   SpO2: 95%     ED Results / Procedures / Treatments   Labs (all labs ordered are listed, but only abnormal results are displayed) Labs Reviewed  URINE CULTURE - Abnormal; Notable for the following components:      Result Value   Culture MULTIPLE SPECIES PRESENT, SUGGEST RECOLLECTION (*)    All other components within normal limits  BASIC METABOLIC PANEL WITH GFR - Abnormal; Notable for the following components:   Chloride 95 (*)    All other components within normal limits  CBC - Abnormal; Notable for the following components:   Hemoglobin 12.6 (*)    HCT 38.7 (*)    All other components within normal limits  URINALYSIS, W/ REFLEX TO CULTURE (INFECTION SUSPECTED) - Abnormal; Notable for the following components:   Color, Urine STRAW (*)    APPearance CLEAR (*)    Glucose, UA >=500 (*)    Leukocytes,Ua MODERATE (*)    Bacteria, UA RARE (*)    All other components within normal limits  URINALYSIS, W/ REFLEX TO CULTURE (INFECTION SUSPECTED) - Abnormal; Notable for the following components:   Color, Urine YELLOW (*)    APPearance CLEAR (*)    Glucose, UA >=500 (*)    Leukocytes,Ua TRACE (*)    All other components within normal limits  D-DIMER, QUANTITATIVE  HEPATIC FUNCTION PANEL  LIPASE, BLOOD  TROPONIN I (HIGH SENSITIVITY)  TROPONIN I (HIGH SENSITIVITY)    MEDICATIONS ORDERED IN ED: Medications  acetaminophen  (TYLENOL ) tablet 650 mg (has no administration in time range)  albuterol  (PROVENTIL ) (2.5 MG/3ML) 0.083% nebulizer  solution 2.5 mg (2.5 mg Inhalation Given 10/04/23 1611)  allopurinol  (ZYLOPRIM ) tablet 300 mg (300 mg Oral Given 10/05/23 0918)  apixaban  (ELIQUIS ) tablet 5 mg (5 mg Oral Given 10/05/23 0916)  atorvastatin  (LIPITOR ) tablet 80 mg (80 mg Oral Given 10/05/23 0916)  baclofen  (LIORESAL ) tablet 10 mg (10 mg Oral Given 10/05/23 0916)  benztropine (COGENTIN) tablet 1 mg (1 mg Oral Given 10/05/23 0915)  fluticasone  furoate-vilanterol (BREO ELLIPTA ) 200-25 MCG/ACT 1 puff (1 puff Inhalation Given 10/05/23 0726)  dapagliflozin  propanediol (FARXIGA ) tablet 10 mg (10 mg Oral Given 10/05/23 0724)  divalproex  (DEPAKOTE ) DR tablet 1,000 mg (1,000 mg Oral Given 10/05/23 0915)  escitalopram  (LEXAPRO ) tablet 20 mg (20 mg Oral Given 10/05/23 0915)  furosemide  (LASIX ) tablet 20 mg (20 mg Oral Given 10/05/23 0917)  paliperidone  (INVEGA ) 24 hr tablet 9 mg (9 mg Oral Given 10/05/23 0917)  melatonin tablet 5 mg (5 mg Oral Given 10/04/23 2212)  metoprolol  succinate (TOPROL -XL) 24 hr tablet 150 mg (150 mg Oral Given 10/05/23 0915)  sacubitril -valsartan  (ENTRESTO ) 24-26 mg per tablet (1 tablet Oral Given 10/05/23 0917)  hydrOXYzine  (ATARAX ) tablet 25 mg (25 mg Oral Given 10/04/23 1611)  lithium  carbonate (LITHOBID ) ER tablet 300 mg (300 mg Oral Given 10/05/23 0724)  lithium  carbonate (LITHOBID ) ER tablet 600 mg (600 mg Oral Given 10/04/23 1752)  diltiazem  (CARDIZEM  CD) 24 hr capsule 360 mg (360 mg Oral Given 10/05/23 0918)  ondansetron  (ZOFRAN ) injection 4 mg (4 mg Intravenous Given 10/03/23 0028)  morphine  (PF) 4 MG/ML injection 4 mg (4 mg Intravenous Given 10/03/23 0028)  furosemide  (LASIX ) injection 40 mg (40 mg Intravenous Given 10/03/23 0149)  acetaminophen  (TYLENOL ) tablet 1,000 mg (1,000 mg Oral Given 10/03/23 1446)     ED Plan   Currently awaiting placement into an appropriate living facility.  Social work is working with the patient to help achieve this.      Ruth Cove, MD 10/05/23 1235

## 2023-10-05 NOTE — ED Notes (Signed)
 Given pt warm blanket at this time. Pt asked for anxiety medicine at this time, informed the RN of this request at this time.

## 2023-10-05 NOTE — ED Notes (Signed)
 Pt provided with lemon lime soda. No other comfort measures requested at this time.

## 2023-10-05 NOTE — ED Notes (Signed)
 RT at bedside.

## 2023-10-05 NOTE — ED Notes (Signed)
 Pt sleeping peacefully in bed at this time. Pt ABCs intact. RR even and unlabored on CPAP machine. Pt in NAD. Bed in lowest locked position with bed alarms set.

## 2023-10-05 NOTE — ED Notes (Signed)
 Pt provided with recliner and 2 warm blankets. Pt ABCs intact. RR even and unlabored. Pt in NAD. Bed in lowest locked position. Call bell in reach. Denies further needs at this time.

## 2023-10-06 LAB — URINE CULTURE: Culture: NO GROWTH

## 2023-10-06 NOTE — ED Notes (Addendum)
 Pt provided with orange sherbet. Sheets changed. No other comfort measures requested at this time.

## 2023-10-06 NOTE — TOC Progression Note (Signed)
 Transition of Care [facility]) - Progression Note    Patient Details  Name: Daniel [name] MRN: [id number] Date of Birth: 06-04-1990  Transition of Care [facility]) CM/SW Contact  Elmira Haddock, LCSW Phone Number: 10/06/2023, 10:00 AM  Clinical Narrative:   CSW awaiting disposition date from Peyton Brash from [place] (facility is [facility]).         Expected Discharge Plan and Services                                               Social Determinants of Health (SDOH) Interventions SDOH Screenings   Food Insecurity: No Food Insecurity (09/01/2020)   Received from [facility], [facility]  Depression [phone]): Low Risk  (05/17/2023)  Financial Resource Strain: Low Risk  (03/16/2021)   Received from [facility], [facility]  Social Connections: Unknown (09/27/2021)   [facility]  Stress: No Stress Concern Present (03/16/2021)   [facility]  Tobacco Use: Medium Risk (10/02/2023)    Readmission Risk Interventions    09/06/2021    9:33 AM  Readmission Risk Prevention Plan  Transportation Screening Complete  PCP or Specialist Appt within 3-5 Days Complete  HRI or Home Care Consult Complete  Social Work Consult for Recovery Care Planning/Counseling Not Complete  SW consult not completed comments RNCM assigned to case  Palliative Care Screening Not Applicable  Medication Review Oceanographer) Complete

## 2023-10-06 NOTE — TOC Progression Note (Addendum)
 Transition of Care [facility]) - Progression Note    Patient Details  Name: Daniel [name] MRN: [id number] Date of Birth: 1990/09/20  Transition of Care [facility]) CM/SW Contact  Joslyn Nim, RN Phone Number: 10/06/2023, 1:26 PM  Clinical Narrative:    CM never received an email from Ms.Gable. CM called patient's lega guardian [name] [phone] and [phone]. CM had to leave a message on both numbers.         Expected Discharge Plan and Services                                               Social Determinants of Health (SDOH) Interventions SDOH Screenings   Food Insecurity: No Food Insecurity (09/01/2020)   Received from [facility], [facility]  Depression [phone]): Low Risk  (05/17/2023)  Financial Resource Strain: Low Risk  (03/16/2021)   Received from [facility], [facility]  Social Connections: Unknown (09/27/2021)   [facility]  Stress: No Stress Concern Present (03/16/2021)   [facility]  Tobacco Use: Medium Risk (10/02/2023)    Readmission Risk Interventions    09/06/2021    9:33 AM  Readmission Risk Prevention Plan  Transportation Screening Complete  PCP or Specialist Appt within 3-5 Days Complete  HRI or Home Care Consult Complete  Social Work Consult for Recovery Care Planning/Counseling Not Complete  SW consult not completed comments RNCM assigned to case  Palliative Care Screening Not Applicable  Medication Review Oceanographer) Complete

## 2023-10-06 NOTE — ED Provider Notes (Addendum)
-----------------------------------------   12:24 AM on 10/06/2023 -----------------------------------------  I viewed and interpreted the patient's CTA head and neck.  I see no obvious acute abnormalities including no evidence of LVO and no intracranial bleed.  Radiology confirmed no acute findings.  I reviewed the patient's medical record and saw prior neurology notes that comment on the patient's conversion disorder and repeated presentations for similar symptoms.  As previously documented, the patient is on Eliquis  and is not a candidate for TNKase.  I believe this is conversion disorder and there is no indication for further intervention at this time.   Lynnda Sas, MD 10/06/23 0025   ----------------------------------------- 6:50 AM on 10/06/2023 -----------------------------------------   Patient walked out of his room, ambulating without difficulty, waiting his arms at me to say hi, no evidence of any focal neurological deficits.    Lynnda Sas, MD 10/06/23 [phone]

## 2023-10-06 NOTE — ED Notes (Signed)
 New Group Home placement

## 2023-10-06 NOTE — TOC Progression Note (Signed)
 Transition of Care [facility]) - Progression Note    Patient Details  Name: Daniel [name] MRN: [id number] Date of Birth: 09/27/1990  Transition of Care [facility]) CM/SW Contact  Joslyn Nim, RN Phone Number: 10/06/2023, 2:47 PM  Clinical Narrative:     CM received a call from legal guardian , Ms. Baxley. She states that she is waiting on authorization for [facility].        Expected Discharge Plan and Services                                               Social Determinants of Health (SDOH) Interventions SDOH Screenings   Food Insecurity: No Food Insecurity (09/01/2020)   Received from [facility], [facility]  Depression [phone]): Low Risk  (05/17/2023)  Financial Resource Strain: Low Risk  (03/16/2021)   Received from [facility], [facility]  Social Connections: Unknown (09/27/2021)   [facility]  Stress: No Stress Concern Present (03/16/2021)   [facility]  Tobacco Use: Medium Risk (10/02/2023)    Readmission Risk Interventions    09/06/2021    9:33 AM  Readmission Risk Prevention Plan  Transportation Screening Complete  PCP or Specialist Appt within 3-5 Days Complete  HRI or Home Care Consult Complete  Social Work Consult for Recovery Care Planning/Counseling Not Complete  SW consult not completed comments RNCM assigned to case  Palliative Care Screening Not Applicable  Medication Review Oceanographer) Complete

## 2023-10-07 LAB — LITHIUM LEVEL: Lithium Lvl: 0.56 mmol/L — ABNORMAL LOW (ref 0.60–1.20)

## 2023-10-07 NOTE — ED Provider Notes (Signed)
-----------------------------------------   4:13 AM on 10/07/2023 -----------------------------------------   Blood pressure 118/64, pulse 80, temperature (!) 97.5 F (36.4 C), temperature source Oral, resp. rate 18, height 6' 3.5" (1.918 m), weight (!) 161 kg, SpO2 95%.  The patient is calm and cooperative at this time.  There have been no acute events since the last update.  Awaiting disposition plan from case management/social work.    Lanard Arguijo, Clover Dao, DO 10/07/23 [phone]

## 2023-10-08 NOTE — ED Notes (Signed)
 Pt removed cpap during sleep, pt endorses he does not feel comfortable and does sleep well with it. Respiratory aware pt removed cpap. Pt resting quietly. Safety maintained

## 2023-10-08 NOTE — ED Notes (Signed)
 This RN notified Dr Cleora Daft of bright red blood present in patient's stool along with patient c/o lower abd pain and back pain.

## 2023-10-08 NOTE — ED Notes (Signed)
 Patient given a sandwich tray and ice water .

## 2023-10-08 NOTE — ED Provider Notes (Signed)
-----------------------------------------   5:49 AM on 10/08/2023 -----------------------------------------   Blood pressure (!) 118/54, pulse 97, temperature (!) 97.5 F (36.4 C), resp. rate 16, height 1.918 m (6' 3.5"), weight (!) 161 kg, SpO2 96%.  The patient is calm and cooperative at this time.  There have been no acute events since the last update.  The patient was up and walking around the emergency department and happily talking to many of the staff around 11 PM, but since that time he has gone into bed and has been calm.Aaron Aas  Awaiting disposition plan from [facility] team.   Lynnda Sas, MD 10/08/23 [phone]

## 2023-10-09 ENCOUNTER — Emergency Department: Payer: MEDICAID

## 2023-10-09 ENCOUNTER — Other Ambulatory Visit: Payer: Self-pay | Admitting: Family

## 2023-10-09 LAB — CBC WITH DIFFERENTIAL/PLATELET
Abs Immature Granulocytes: 0.18 10*3/uL — ABNORMAL HIGH (ref 0.00–0.07)
Basophils Absolute: 0.1 10*3/uL (ref 0.0–0.1)
Basophils Relative: 1 %
Eosinophils Absolute: 0.2 10*3/uL (ref 0.0–0.5)
Eosinophils Relative: 2 %
HCT: 39.1 % (ref 39.0–52.0)
Hemoglobin: 12.6 g/dL — ABNORMAL LOW (ref 13.0–17.0)
Immature Granulocytes: 2 %
Lymphocytes Relative: 33 %
Lymphs Abs: 2.6 10*3/uL (ref 0.7–4.0)
MCH: 29.4 pg (ref 26.0–34.0)
MCHC: 32.2 g/dL (ref 30.0–36.0)
MCV: 91.4 fL (ref 80.0–100.0)
Monocytes Absolute: 0.7 10*3/uL (ref 0.1–1.0)
Monocytes Relative: 8 %
Neutro Abs: 4.3 10*3/uL (ref 1.7–7.7)
Neutrophils Relative %: 54 %
Platelets: 229 10*3/uL (ref 150–400)
RBC: 4.28 MIL/uL (ref 4.22–5.81)
RDW: 13.5 % (ref 11.5–15.5)
WBC: 7.9 10*3/uL (ref 4.0–10.5)
nRBC: 0 % (ref 0.0–0.2)

## 2023-10-09 LAB — COMPREHENSIVE METABOLIC PANEL WITH GFR
ALT: 28 U/L (ref 0–44)
AST: 28 U/L (ref 15–41)
Albumin: 3.8 g/dL (ref 3.5–5.0)
Alkaline Phosphatase: 55 U/L (ref 38–126)
Anion gap: 9 (ref 5–15)
BUN: 17 mg/dL (ref 6–20)
CO2: 27 mmol/L (ref 22–32)
Calcium: 8.9 mg/dL (ref 8.9–10.3)
Chloride: 99 mmol/L (ref 98–111)
Creatinine, Ser: 0.83 mg/dL (ref 0.61–1.24)
GFR, Estimated: >60 mL/min (ref >60)
Glucose, Bld: 118 mg/dL — ABNORMAL HIGH (ref 70–99)
Potassium: 4.2 mmol/L (ref 3.5–5.1)
Sodium: 135 mmol/L (ref 135–145)
Total Bilirubin: 0.5 mg/dL (ref 0.0–1.2)
Total Protein: 7.1 g/dL (ref 6.5–8.1)

## 2023-10-09 LAB — PROTIME-INR
INR: 1.1 (ref 0.8–1.2)
Prothrombin Time: 14.5 s (ref 11.4–15.2)

## 2023-10-09 LAB — URINALYSIS, ROUTINE W REFLEX MICROSCOPIC
Bilirubin Urine: NEGATIVE
Glucose, UA: >=500 mg/dL — AB
Hgb urine dipstick: NEGATIVE
Ketones, ur: NEGATIVE mg/dL
Nitrite: NEGATIVE
Protein, ur: NEGATIVE mg/dL
Specific Gravity, Urine: 1.013 (ref 1.005–1.030)
pH: 7 (ref 5.0–8.0)

## 2023-10-09 LAB — TYPE AND SCREEN
ABO/RH(D): O POS
Antibody Screen: NEGATIVE

## 2023-10-09 LAB — OCCULT BLOOD X 1 CARD TO LAB, STOOL: Fecal Occult Bld: NEGATIVE

## 2023-10-09 LAB — LIPASE, BLOOD: Lipase: 35 U/L (ref 11–51)

## 2023-10-09 MED ORDER — IOHEXOL 350 MG/ML SOLN
100.0000 mL | Freq: Once | INTRAVENOUS | Status: AC | PRN
  Administered 2023-10-09: 100 mL via INTRAVENOUS

## 2023-10-09 MED ORDER — SODIUM CHLORIDE 0.9 % IV BOLUS (SEPSIS)
1000.0000 mL | Freq: Once | INTRAVENOUS | Status: AC
  Administered 2023-10-09: 1000 mL via INTRAVENOUS

## 2023-10-09 MED ORDER — KETOROLAC TROMETHAMINE 30 MG/ML IJ SOLN
15.0000 mg | Freq: Once | INTRAMUSCULAR | Status: AC
  Administered 2023-10-09: 15 mg via INTRAVENOUS
  Filled 2023-10-09: qty 1

## 2023-10-09 NOTE — ED Notes (Signed)
 Patient to CT at this time

## 2023-10-09 NOTE — ED Notes (Signed)
 Patient resting comfortably in bed wearing CPAP. Chest rise and fall noted. RR even and unlabored.

## 2023-10-09 NOTE — ED Notes (Signed)
 Hat placed in toilet for stool specimen collection.

## 2023-10-09 NOTE — ED Notes (Signed)
 Lab called to obtain blue and pink top. Patient is a hard stick and IV does not give blood return.

## 2023-10-09 NOTE — ED Notes (Signed)
 Large formed bowel movement. Blood noted on toilet wipe from rectum.

## 2023-10-09 NOTE — TOC Progression Note (Addendum)
 Transition of Care [facility]) - Progression Note    Patient Details  Name: Daniel [name] MRN: [id number] Date of Birth: 08/29/1990  Transition of Care [facility]) CM/SW Contact  Arminda Landmark, RN Phone Number: 10/09/2023, 9:17 AM  Clinical Narrative:    Pt is boarding in the ED awaiting placement in a group home. Called his guardian Dennise Fitz and she states he still doesn't have auth for new facility. She states this process can take anywhere between 2 days to a week. Asked her to let us  know asap as he's medically clear to be Dc'd and she verbalized understanding. 1500: Message received from RN that he has questions about his placement. Spoke with pt and told him his guardian is trying to get him into [facility] and is working on Engineer, maintenance (IT), and he verbalized understanding. He also requested more clothes and his arts and crafts belongings be brought in. Called his guardian Paola Bohr and she states will work on getting his things. TOC to continue to follow        Expected Discharge Plan and Services                                               Social Determinants of Health (SDOH) Interventions SDOH Screenings   Food Insecurity: No Food Insecurity (09/01/2020)   Received from [facility], [facility]  Depression [phone]): Low Risk  (05/17/2023)  Financial Resource Strain: Low Risk  (03/16/2021)   Received from [facility], [facility]  Social Connections: Unknown (09/27/2021)   [facility]  Stress: No Stress Concern Present (03/16/2021)   [facility]  Tobacco Use: Medium Risk (10/02/2023)    Readmission Risk Interventions    09/06/2021    9:33 AM  Readmission Risk Prevention Plan  Transportation Screening Complete  PCP or Specialist Appt within 3-5 Days Complete  HRI or Home Care Consult Complete  Social Work Consult for Recovery Care Planning/Counseling Not Complete  SW consult not  completed comments RNCM assigned to case  Palliative Care Screening Not Applicable  Medication Review Oceanographer) Complete

## 2023-10-09 NOTE — ED Notes (Addendum)
 Patient in chair in no distress crafting. Will continue to monitor.

## 2023-10-09 NOTE — ED Provider Notes (Addendum)
 12:10 AM  I was informed by nurse that patient was complaining of lower abdominal pain and having bright red blood per rectum.  He is on Eliquis .  Will obtain labs, urine, CT of the abdomen pelvis.  Will give IV fluids, pain medication.  Will keep NPO.  4:00 AM  Pt continues to be hemodynamically stable.  He has not had any bloody bowel movements since I have taken over care.  His repeat blood work shows stable hemoglobin of 12.6 which is exactly what it was a week ago.  Platelet counts normal.  Normal electrolytes, LFTs and lipase.  INR of 1.1.  Urine shows some pyuria and bacteriuria but he is not having urinary symptoms.  Urine culture a week ago grew multiple species.  Will repeat urine culture today.  CT read pending.  4:16 AM  Pt's CTA negative for acute process or active bleed.  No indication at this time that he needs medical admission.  Will continue to monitor.  Patient awaiting social work disposition to group home.   Mariam Helbert, Clover Dao, DO 10/09/23 0416  ----------------------------------------- 6:08 AM on 10/09/2023 -----------------------------------------   Blood pressure 107/71, pulse 62, temperature 97.8 F (36.6 C), temperature source Oral, resp. rate 17, height 6' 3.5" (1.918 m), weight (!) 161 kg, SpO2 99%.  The patient is calm and cooperative at this time.  There have been no acute events since the last update.  Awaiting disposition plan from case management/social work.    Eshaan Titzer, Clover Dao, DO 10/09/23 [phone]

## 2023-10-09 NOTE — ED Notes (Signed)
 Patient notified this RN that he is having some lower abdominal pain and lower back pain. Patient also states that he had bloody stool earlier and has been having scant amounts of blood on the blanket he is sitting on. MD Ward notified.

## 2023-10-10 LAB — URINE CULTURE

## 2023-10-10 LAB — GROUP A STREP BY PCR: Group A Strep by PCR: NOT DETECTED

## 2023-10-10 NOTE — ED Notes (Signed)
 Shirleyann Douse from [employer] called to get update regarding Pts placement, this RN told Adriana Hopping I would pass info on to someone who who would be able to help her. Marolyn Sis, RN messaged to give Adriana Hopping a call at [phone].

## 2023-10-10 NOTE — ED Notes (Signed)
 Patient up in recliner crafting. No distress. Will continue to monitor.

## 2023-10-10 NOTE — ED Notes (Signed)
 Pt ready for bed, called RT to place Pt back on BiPAP.

## 2023-10-10 NOTE — TOC Progression Note (Signed)
 Transition of Care [facility]) - Progression Note    Patient Details  Name: Daniel [name] MRN: [id number] Date of Birth: 1990/09/03  Transition of Care [facility]) CM/SW Contact  Elmira Haddock, LCSW Phone Number: 10/10/2023, 12:43 PM  Clinical Narrative:    CSW rec'd Epic chat from RN        Expected Discharge Plan and Services  [place] stating that Shirleyann Douse [phone]) from [facility]) would like an update on information with patient being placed at [facility].   CSW phoned patient's guardian Dennise Fitz, [phone]) to discuss updates with her prior to calling partners rep, Parker Bollard.  Nellie Banas states that [facility] [employer] has to write up several plans and get them to [facility] and, she states, that it takes time.  CSW asked if there were other options available.  Nellie Banas states that there isn't because patient has been to lots off facilities around the state and that he has been very disruptive.  She further states that patient was at [facility] for 2 1/2 years where she feels that he was successful.  She reports that there was no reason given to her as to why the patient couldn't return to [facility].    Parker Bollard states that she will reach out to her supervisor, Hulen Mages, and ask her to speak with the guardian's supervisor on this issue because, she states, that the guardian isn't responding to her for information or completing the necessary paperwork for needed for patient to be moved to [facility].    TOC to continue to follow for d/c planning.                                             Social Determinants of Health (SDOH) Interventions SDOH Screenings   Food Insecurity: No Food Insecurity (09/01/2020)   Received from [facility], [facility]  Depression [phone]): Low Risk  (05/17/2023)  Financial Resource Strain: Low Risk  (03/16/2021)   Received from [facility], [facility]  Social  Connections: Unknown (09/27/2021)   Received from [facility], [facility]  Stress: No Stress Concern Present (03/16/2021)   [facility]  Tobacco Use: Medium Risk (10/02/2023)    Readmission Risk Interventions    09/06/2021    9:33 AM  Readmission Risk Prevention Plan  Transportation Screening Complete  PCP or Specialist Appt within 3-5 Days Complete  HRI or Home Care Consult Complete  Social Work Consult for Recovery Care Planning/Counseling Not Complete  SW consult not completed comments RNCM assigned to case  Palliative Care Screening Not Applicable  Medication Review Oceanographer) Complete

## 2023-10-10 NOTE — ED Notes (Signed)
 Pt provided breakfast tray.

## 2023-10-10 NOTE — ED Provider Notes (Signed)
-----------------------------------------   5:42 AM on 10/10/2023 -----------------------------------------   Blood pressure 125/78, pulse 71, temperature 98.3 F (36.8 C), temperature source Oral, resp. rate 18, height 6' 3.5" (1.918 m), weight (!) 161 kg, SpO2 100%.  The patient is calm and cooperative at this time.  There have been no acute events since the last update.  Awaiting disposition plan from Social Work team.   Demitrios Molyneux J, MD 10/10/23 [phone]

## 2023-10-10 NOTE — ED Notes (Signed)
Patient placed on CPAP at this time.

## 2023-10-11 NOTE — ED Notes (Signed)
 Patient in room conversing on the phone. No distress. Will continue to monitor.

## 2023-10-11 NOTE — ED Notes (Signed)
 Patient in room crafting. No distress. Fluids provided. CB in reach. Will continue to monitor.

## 2023-10-11 NOTE — ED Notes (Signed)
 Pt given phone and reminded of 15 minute limit.

## 2023-10-11 NOTE — ED Provider Notes (Signed)
 Emergency Medicine Observation Re-evaluation Note  Physical Exam   BP (!) 143/70 (BP Location: Left Arm)   Pulse 77   Temp 98.2 F (36.8 C) (Oral)   Resp 18   Ht 6' 3.5" (1.918 m)   Wt (!) 161 kg   SpO2 97%   BMI 43.78 kg/m   Patient appears in no acute distress.  ED Course / MDM   No reported events during my shift at the time of this note.   Pt is awaiting dispo from consultants   Buell Carmin MD    Buell Carmin, MD 10/11/23 0110

## 2023-10-11 NOTE — ED Provider Notes (Signed)
 Emergency Medicine Observation Re-evaluation Note  Daniel [name] is a 33 y.o. male, seen on rounds today.  Pt initially presented to the ED for complaints of Chest Pain   Physical Exam  BP 118/78 (BP Location: Right Arm)   Pulse 80   Temp 98.1 F (36.7 C) (Oral)   Resp 14   Ht 1.918 m (6' 3.5")   Wt (!) 161 kg   SpO2 100%   BMI 43.78 kg/m   Patient well-appearing, ambulating around the room, frequently standing at the door  ED Course / MDM    Plan  Current plan is for Daniel [name] disposition    Bryson Carbine, MD 10/11/23 2302

## 2023-10-11 NOTE — ED Notes (Signed)
 Patient given ice water

## 2023-10-12 NOTE — Progress Notes (Signed)
   10/12/23 1045  Spiritual Encounters  Type of Visit Follow up  Care provided to: Patient  Conversation partners present during encounter Nurse  Reason for visit Routine spiritual support  OnCall Visit No   Chaplain attempted to visit the patient but he was on the phone.  Patient asked Chaplain to come back and Chaplain shared that patient could have the Nurse call the Chaplain phone when he's ready.    Rev. Rana M. Nolon Baxter, M.Div. Chaplain Resident [facility]

## 2023-10-12 NOTE — ED Notes (Addendum)
 This RN called patient's contact, Neil Balls, per patient request. Patient requesting to have his belongings brought to the hospital. This RN told Autry Legions who states he is not able to do that at this time. Patient notified.

## 2023-10-12 NOTE — Progress Notes (Signed)
   10/12/23 1415  Spiritual Encounters  Type of Visit Follow up  Care provided to: Patient  Conversation partners present during encounter Nurse  Reason for visit Routine spiritual support  OnCall Visit No   Chaplain visited patient as a follow-up from earlier today.  Chaplain celebrated patient's gifts as he showed Chaplain his Patent attorney.  Chaplain's colleague shared that patient wanted to have someone make some store purchases for him but Chaplain shared this would be outside of the scope of care.  Patient asked if Chaplain could take the patient off the Unit and staff shared we wouldn't be able to meet this request.  Patient is welcome to have his guardian make purchases and bring them, etc.  Patient handled this news well.  He is waiting to make a call to his guardian.    Rev. Rana M. Nolon Baxter, M.Div. Chaplain Resident [facility]

## 2023-10-12 NOTE — Progress Notes (Signed)
   10/12/23 1107  Spiritual Encounters  Type of Visit Initial  Care provided to: Patient  Conversation partners present during encounter Nurse  Referral source Nurse (RN/NT/LPN)  Reason for visit Routine spiritual support  OnCall Visit Yes  Spiritual Framework  Presenting Themes Meaning/purpose/sources of inspiration (Patient takes delight in working with his jewelry and mixed media.)  Interventions  Spiritual Care Interventions Made Established relationship of care and support;Reflective listening  Intervention Outcomes  Outcomes Connection to spiritual care  Spiritual Care Plan  Spiritual Care Issues Still Outstanding No further spiritual care needs at this time (see row info)   Patient called chaplain to request someone to go and purchase him some jewelry making things from [employer].

## 2023-10-12 NOTE — TOC Progression Note (Signed)
 Transition of Care [facility]) - Progression Note    Patient Details  Name: Daniel [name] MRN: [id number] Date of Birth: 27-Sep-1990  [facility]) CM/SW Contact  Arminda Landmark, RN Phone Number: 10/12/2023, 4:49 PM  Clinical Narrative:    Spoke with guardian Daniel [name] and she states that [facility] hasn't gotten back with them yet. We are waiting for placement in a [facility] group home. TOC to continue to follow.         Expected Discharge Plan and Services                                               Social Determinants of Health (SDOH) Interventions SDOH Screenings   Food Insecurity: No Food Insecurity (09/01/2020)   Received from [facility], [facility]  Depression [phone]): Low Risk  (05/17/2023)  Financial Resource Strain: Low Risk  (03/16/2021)   Received from [facility], [facility]  Social Connections: Unknown (09/27/2021)   [facility]  Stress: No Stress Concern Present (03/16/2021)   [facility]  Tobacco Use: Medium Risk (10/02/2023)    Readmission Risk Interventions    09/06/2021    9:33 AM  Readmission Risk Prevention Plan  Transportation Screening Complete  PCP or Specialist Appt within 3-5 Days Complete  HRI or Home Care Consult Complete  Social Work Consult for Recovery Care Planning/Counseling Not Complete  SW consult not completed comments RNCM assigned to case  Palliative Care Screening Not Applicable  Medication Review Oceanographer) Complete

## 2023-10-12 NOTE — ED Provider Notes (Signed)
-----------------------------------------   5:41 AM on 10/12/2023 -----------------------------------------   Blood pressure 118/78, pulse 80, temperature 98.1 F (36.7 C), temperature source Oral, resp. rate 14, height 6' 3.5" (1.918 m), weight (!) 161 kg, SpO2 100%.  The patient is calm and cooperative at this time.  There have been no acute events since the last update.  Awaiting disposition plan from Social Work team.   Forbes Loll J, MD 10/12/23 [phone]

## 2023-10-13 NOTE — ED Notes (Signed)
 The patient has requested someone to come into his room to speak with him about his feelings. This RN has called the chaplain to come to speak with him.

## 2023-10-13 NOTE — ED Provider Notes (Signed)
 Emergency Medicine Observation Re-evaluation Note  Terez Millard is a 33 y.o. male, seen on rounds today.  Pt initially presented to the ED for complaints of Chest Pain  Currently, the patient is calm, no acute complaints.  Physical Exam  Blood pressure 115/76, pulse 84, temperature 98.3 F (36.8 C), temperature source Oral, resp. rate 17, height 6' 3.5" (1.918 m), weight (!) 161 kg, SpO2 97%. Physical Exam General: NAD Lungs: CTAB Psych: not agitated Ambulatory, good spirits, occupying himself with small crafts at times.  ED Course / MDM  EKG:    I have reviewed the labs performed to date as well as medications administered while in observation.  Recent changes in the last 24 hours include no acute events overnight.    Plan  Current plan is for TOC placement.   Jacquie Maudlin, MD 10/13/23 2246

## 2023-10-13 NOTE — ED Notes (Signed)
 TOC

## 2023-10-13 NOTE — ED Provider Notes (Signed)
 Emergency Medicine Observation Re-evaluation Note  Daniel [name] is a 33 y.o. male, seen on rounds today.  Pt initially presented to the ED for complaints of Chest Pain  Currently, the patient is calm, no acute complaints.  Physical Exam  Blood pressure 127/68, pulse 76, temperature 97.8 F (36.6 C), temperature source Oral, resp. rate 18, height 6' 3.5" (1.918 m), weight (!) 161 kg, SpO2 98%. Physical Exam General: NAD Lungs: CTAB Psych: not agitated  ED Course / MDM  EKG:    I have reviewed the labs performed to date as well as medications administered while in observation.  Recent changes in the last 24 hours include no acute events overnight.    Plan  Current plan is for TOC placement.   Jacquie Maudlin, MD 10/13/23 [phone]

## 2023-10-13 NOTE — Progress Notes (Signed)
   10/13/23 1330  Spiritual Encounters  Type of Visit Follow up  Care provided to: Patient  Conversation partners present during encounter Nurse  Reason for visit Routine spiritual support  OnCall Visit No   Chaplain's colleague shared that patient requested spiritual care.  Patient shared that he as having back and leg pain.  Patient asked if someone could go get patient's supplies for jewelry making; however, Chaplain shared this is outside of Chaplain's scope of care.  Chaplain did a spritual assessment and patient confirmed he's been depressed before and had suicidal ideation in the past. Chaplain asked probing questions but patient moved conversation back to the less vulnerable topic of jewelry.  Rev. Rana M. Nolon Baxter, M.Div. Chaplain Resident [facility]

## 2023-10-13 NOTE — ED Notes (Signed)
 Chaplain at bedside

## 2023-10-13 NOTE — ED Notes (Signed)
 Patient sitting up in bed and eating breakfast at this time

## 2023-10-14 ENCOUNTER — Emergency Department: Payer: MEDICAID

## 2023-10-14 MED ORDER — FUROSEMIDE 40 MG PO TABS
20.0000 mg | ORAL_TABLET | Freq: Every day | ORAL | Status: DC
  Administered 2023-10-14 – 2023-10-15 (×2): 20 mg via ORAL
  Filled 2023-10-14 (×2): qty 1

## 2023-10-14 NOTE — ED Notes (Signed)
 Edwina Gram, Owner @ [employer] @ [phone], called into ED at 1630 when the patient was suppose to again be picked up after being ready to discharge for >1week.  She stated "I think I'd rather pick him up Monday" [facility] staff informed Edwina Gram that we would be contacting the [employer] and that this would be considered abandonment.  Spoke to Pt's [employer] (Legal Guardian) [phone], she stated that she has been trying to speak with the patients [facility] and explain this for a week.  LG agrees that [facility] has abandoned pt in the ED.

## 2023-10-14 NOTE — ED Notes (Signed)
 Pt advised that his mother called him and told him that he was going back to his group home today. ED staff are unaware of this at this time. Pt's legal guardian called, Daniel [name], and she advised that the group home was supposed to be picking up the pt all week this week and that they are supposed to be picking him up today. Pt's guardian advised that a Daniel [name] would be picking pt up. Dr. Valetta Gaudy and charge made aware of this.

## 2023-10-14 NOTE — ED Notes (Signed)
 Pt provided breakfast and extra juice.

## 2023-10-14 NOTE — ED Notes (Signed)
 Introduced self to pt. Brought pt two warm blankets for this sore arms. Pt resting with no other needs at this time.

## 2023-10-14 NOTE — ED Notes (Signed)
 Spoke with Edwina Gram, Owner @ [employer] @ [phone]. Explained that pt guardian informed staff that he has been ready to return to [facility] for >5 days.  [employer] owner Edwina Gram stated that he would be picked up between 1600 - 1630. [facility] staff explained that if the patient is ready and they

## 2023-10-14 NOTE — ED Provider Notes (Signed)
 Patient reports that he has noticed a little bit of swelling in both feet over the last few days, and also some swelling in his left arm.  Not painful or uncomfortable but reports he gets swelling sometimes when he starts to have fluid buildup.  He is awake alert fully oriented and appears euvolemic at this time, with exception to mild edema in both lower extremities and slight to moderate in the left upper extremity.  It is less appreciable in the right upper extremity.  I do wish to exclude DVT though I think this is unlikely, other consideration for hypervolemia especially potential volume overload is considered.  He has no acute respiratory symptoms.  Will give additional Lasix  for the next 3 days and continue to monitor.  Left upper extremity ultrasound to exclude DVT   Iver Marker, MD 10/14/23 [phone]

## 2023-10-14 NOTE — ED Provider Notes (Signed)
 Emergency Medicine Observation Re-evaluation Note  Daniel [name] is a 33 y.o. male, seen on rounds today.  Pt initially presented to the ED for complaints of Chest Pain  Currently, the patient is resting in bed. No reported issues from nursing team.   Physical Exam  BP 115/76 (BP Location: Right Arm)   Pulse 84   Temp 98.3 F (36.8 C) (Oral)   Resp 17   Ht 6' 3.5" (1.918 m)   Wt (!) 161 kg   SpO2 97%   BMI 43.78 kg/m  Physical Exam General: Resting in bed  ED Course / MDM   No labs last 24 hours.  Plan  Current plan is for dispo per social work.    Claria Crofts, MD 10/14/23 [phone]

## 2023-10-14 NOTE — ED Notes (Signed)
 Pt received snack and drink

## 2023-10-14 NOTE — ED Notes (Signed)
 Pt stated that he spoke to his [facility] on the phone and they informed him that they have not been receiving compensation for their services and also that [facility], the caregiver who is supposed to be picking him up, will not be able to pick him up until Monday because she has been in the hospital. Pt also stated that he was told that he will be going to stay with Tyra Galley at her home until the first of the month.

## 2023-10-14 NOTE — ED Provider Notes (Addendum)
 Emergency Medicine Observation Re-evaluation Note  Daniel [name] is a 33 y.o. male, seen on rounds today.  Pt initially presented to the ED for complaints of Chest Pain  Currently, the patient is calm, no acute complaints.  Physical Exam  Blood pressure (!) 119/48, pulse 76, temperature 98.3 F (36.8 C), temperature source Oral, resp. rate 17, height 6' 3.5" (1.918 m), weight (!) 174.2 kg, SpO2 97%. Physical Exam General: NAD Lungs: CTAB Psych: not agitated  ED Course / MDM  EKG:    I have reviewed the labs performed to date as well as medications administered while in observation.  Recent changes in the last 24 hours include no acute events overnight.    Plan  Current plan is for discharge with guardian/group home today.   Jacquie Maudlin, MD 10/14/23 1533    Jacquie Maudlin, MD 10/14/23 1534

## 2023-10-14 NOTE — ED Notes (Signed)
 Pt also telling this RN his Left hand through elbow is swollen and painful. Dr Valetta Gaudy to be made aware.

## 2023-10-14 NOTE — ED Notes (Signed)
 Pt bed wet, pt asking for new linens and gowns. Pt provided with new gown, bed sheets changed, warm blankets provided.

## 2023-10-14 NOTE — ED Notes (Signed)
 RT in room to hook pt up to cpap machine.

## 2023-10-14 NOTE — ED Provider Notes (Signed)
 No dvt on left upper extremity Daniel Saucer, MD 10/14/23 [phone]

## 2023-10-14 NOTE — ED Notes (Signed)
 Pt experiencing pain and some swelling in left knee. Likely due to the fact his bed is very low and getting up and down has put extra strain on it as pt has hardware in that knee. Pt's bed will be raised up slightly. Pt given tylenol  and warm packs for comfort.

## 2023-10-15 NOTE — ED Notes (Signed)
 Rn at bedside. Pt reporting increased anxiety and requesting PRN medication

## 2023-10-15 NOTE — ED Provider Notes (Signed)
 [facility] Observation Note   ----------------------------------------- 5:26 AM on 10/15/2023 -----------------------------------------  Daniel [name] is a 33 y.o. male currently boarding in the Emergency Department.  No acute events since last update.  Recent Vitals   Most recent vital signs: Vitals:   10/14/23 1012 10/14/23 1640  BP: (!) 119/48 (!) 140/70  Pulse: 76 86  Resp:  18  Temp:    SpO2:  96%    ED Results / Procedures / Treatments   Labs (all labs ordered are listed, but only abnormal results are displayed) Labs Reviewed  URINE CULTURE - Abnormal; Notable for the following components:      Result Value   Culture MULTIPLE SPECIES PRESENT, SUGGEST RECOLLECTION (*)    All other components within normal limits  URINE CULTURE - Abnormal; Notable for the following components:   Culture MULTIPLE SPECIES PRESENT, SUGGEST RECOLLECTION (*)    All other components within normal limits  BASIC METABOLIC PANEL WITH GFR - Abnormal; Notable for the following components:   Chloride 95 (*)    All other components within normal limits  CBC - Abnormal; Notable for the following components:   Hemoglobin 12.6 (*)    HCT 38.7 (*)    All other components within normal limits  URINALYSIS, W/ REFLEX TO CULTURE (INFECTION SUSPECTED) - Abnormal; Notable for the following components:   Color, Urine STRAW (*)    APPearance CLEAR (*)    Glucose, UA >=500 (*)    Leukocytes,Ua MODERATE (*)    Bacteria, UA RARE (*)    All other components within normal limits  URINALYSIS, W/ REFLEX TO CULTURE (INFECTION SUSPECTED) - Abnormal; Notable for the following components:   Color, Urine YELLOW (*)    APPearance CLEAR (*)    Glucose, UA >=500 (*)    Leukocytes,Ua TRACE (*)    All other components within normal limits  LITHIUM  LEVEL - Abnormal; Notable for the following components:   Lithium  Lvl 0.56 (*)    All other components within normal limits  CBC WITH  DIFFERENTIAL/PLATELET - Abnormal; Notable for the following components:   Hemoglobin 12.6 (*)    Abs Immature Granulocytes 0.18 (*)    All other components within normal limits  COMPREHENSIVE METABOLIC PANEL WITH GFR - Abnormal; Notable for the following components:   Glucose, Bld 118 (*)    All other components within normal limits  URINALYSIS, ROUTINE W REFLEX MICROSCOPIC - Abnormal; Notable for the following components:   Color, Urine YELLOW (*)    APPearance HAZY (*)    Glucose, UA >=500 (*)    Leukocytes,Ua TRACE (*)    Bacteria, UA RARE (*)    All other components within normal limits  URINE CULTURE  GROUP A STREP BY PCR  D-DIMER, QUANTITATIVE  HEPATIC FUNCTION PANEL  LIPASE, BLOOD  LIPASE, BLOOD  OCCULT BLOOD X 1 CARD TO LAB, STOOL  PROTIME-INR  TYPE AND SCREEN  TROPONIN I (HIGH SENSITIVITY)  TROPONIN I (HIGH SENSITIVITY)    MEDICATIONS ORDERED IN ED: Medications  acetaminophen  (TYLENOL ) tablet 650 mg (650 mg Oral Given 10/14/23 2120)  albuterol  (PROVENTIL ) (2.5 MG/3ML) 0.083% nebulizer solution 2.5 mg (2.5 mg Inhalation Given 10/12/23 2324)  allopurinol  (ZYLOPRIM ) tablet 300 mg (300 mg Oral Not Given 10/14/23 1526)  apixaban  (ELIQUIS ) tablet 5 mg (5 mg Oral Given 10/14/23 2120)  atorvastatin  (LIPITOR ) tablet 80 mg (80 mg Oral Given 10/14/23 1054)  baclofen  (LIORESAL ) tablet 10 mg (10 mg Oral Given 10/14/23 2120)  benztropine  (COGENTIN ) tablet  1 mg (1 mg Oral Given 10/14/23 2120)  fluticasone  furoate-vilanterol (BREO ELLIPTA ) 200-25 MCG/ACT 1 puff (1 puff Inhalation Given 10/14/23 0934)  dapagliflozin  propanediol (FARXIGA ) tablet 10 mg (10 mg Oral Given 10/14/23 0931)  divalproex  (DEPAKOTE ) DR tablet 1,000 mg (1,000 mg Oral Given 10/14/23 2120)  escitalopram  (LEXAPRO ) tablet 20 mg (20 mg Oral Given 10/14/23 1055)  furosemide  (LASIX ) tablet 20 mg (20 mg Oral Given 10/14/23 1100)  paliperidone  (INVEGA ) 24 hr tablet 9 mg (9 mg Oral Given 10/14/23 1054)  melatonin tablet 5 mg (5 mg  Oral Given 10/14/23 2121)  metoprolol  succinate (TOPROL -XL) 24 hr tablet 150 mg (150 mg Oral Given 10/14/23 1012)  sacubitril -valsartan  (ENTRESTO ) 24-26 mg per tablet (1 tablet Oral Given 10/14/23 2123)  hydrOXYzine  (ATARAX ) tablet 25 mg (25 mg Oral Given 10/14/23 2121)  lithium  carbonate (LITHOBID ) ER tablet 300 mg (300 mg Oral Given 10/14/23 0931)  lithium  carbonate (LITHOBID ) ER tablet 600 mg (600 mg Oral Given 10/14/23 1833)  diltiazem  (CARDIZEM  CD) 24 hr capsule 360 mg (360 mg Oral Not Given 10/14/23 1526)  furosemide  (LASIX ) tablet 20 mg (20 mg Oral Given 10/14/23 1012)  ondansetron  (ZOFRAN ) injection 4 mg (4 mg Intravenous Given 10/03/23 0028)  morphine  (PF) 4 MG/ML injection 4 mg (4 mg Intravenous Given 10/03/23 0028)  furosemide  (LASIX ) injection 40 mg (40 mg Intravenous Given 10/03/23 0149)  acetaminophen  (TYLENOL ) tablet 1,000 mg (1,000 mg Oral Given 10/03/23 1446)  ondansetron  (ZOFRAN -ODT) disintegrating tablet 4 mg (4 mg Oral Given 10/05/23 1738)  oxyCODONE -acetaminophen  (PERCOCET/ROXICET) 5-325 MG per tablet 1 tablet (1 tablet Oral Given 10/05/23 1738)  iohexol  (OMNIPAQUE ) 350 MG/ML injection 75 mL (75 mLs Intravenous Contrast Given 10/05/23 2331)  sodium chloride  0.9 % bolus 1,000 mL (0 mLs Intravenous Stopped 10/09/23 0257)  ketorolac  (TORADOL ) 30 MG/ML injection 15 mg (15 mg Intravenous Given 10/09/23 0052)  iohexol  (OMNIPAQUE ) 350 MG/ML injection 100 mL (100 mLs Intravenous Contrast Given 10/09/23 0206)     ED Plan   Currently awaiting placement into an appropriate living facility.  Social work is working with the patient to help achieve this.      Ruth Cove, MD 10/15/23 [phone]

## 2023-10-15 NOTE — ED Notes (Signed)
 Breakfast tray provided.

## 2023-10-15 NOTE — ED Notes (Signed)
 Pt out of room stating to EDT he believes he had a seizure last pm and urinated on himself. Pt A&Ox4. Pt bed linens and scrubs soiled. Pt performed self hygiene. New scrubs and bed linens provided. CPAP turned off.

## 2023-10-15 NOTE — ED Notes (Signed)
 Pt provided dinner tray.

## 2023-10-15 NOTE — ED Notes (Addendum)
 Pt requesting to call down for breakfast. This RN explaining due to the area pt is located pt will be provided standard tray

## 2023-10-16 ENCOUNTER — Telehealth: Payer: Self-pay | Admitting: Family

## 2023-10-16 MED ORDER — HYDROXYZINE HCL 25 MG PO TABS: 25.0000 mg | ORAL_TABLET | Freq: Three times a day (TID) | ORAL | 0 refills | Status: AC | PRN

## 2023-10-16 MED ORDER — ALLOPURINOL 300 MG PO TABS: 300.0000 mg | ORAL_TABLET | Freq: Every day | ORAL | 2 refills | Status: AC

## 2023-10-16 MED ORDER — SACUBITRIL-VALSARTAN 24-26 MG PO TABS: 1.0000 | ORAL_TABLET | Freq: Two times a day (BID) | ORAL | 0 refills | Status: AC

## 2023-10-16 MED ORDER — LITHIUM CARBONATE 300 MG PO TABS: 600.0000 mg | ORAL_TABLET | Freq: Every evening | ORAL | 2 refills | Status: AC

## 2023-10-16 MED ORDER — MELATONIN 5 MG PO TABS: 5.0000 mg | ORAL_TABLET | Freq: Every day | ORAL | 0 refills | Status: AC

## 2023-10-16 MED ORDER — DAPAGLIFLOZIN PROPANEDIOL 10 MG PO TABS
10.0000 mg | ORAL_TABLET | Freq: Every day | ORAL | 0 refills | Status: AC
Start: 2023-10-16 — End: 2023-11-15

## 2023-10-16 MED ORDER — METOPROLOL SUCCINATE ER 100 MG PO TB24: 150.0000 mg | ORAL_TABLET | Freq: Every day | ORAL | 0 refills | Status: AC

## 2023-10-16 MED ORDER — BACLOFEN 10 MG PO TABS
10.0000 mg | ORAL_TABLET | Freq: Two times a day (BID) | ORAL | 1 refills | Status: AC
Start: 2023-10-16 — End: 2024-10-15

## 2023-10-16 MED ORDER — ATORVASTATIN CALCIUM 80 MG PO TABS: 80.0000 mg | ORAL_TABLET | Freq: Every day | ORAL | 11 refills | Status: DC

## 2023-10-16 MED ORDER — ALBUTEROL SULFATE (2.5 MG/3ML) 0.083% IN NEBU: 2.5000 mg | INHALATION_SOLUTION | Freq: Four times a day (QID) | RESPIRATORY_TRACT | 2 refills | Status: AC | PRN

## 2023-10-16 MED ORDER — ESCITALOPRAM OXALATE 20 MG PO TABS: 20.0000 mg | ORAL_TABLET | Freq: Every day | ORAL | 2 refills | Status: AC

## 2023-10-16 MED ORDER — BUDESONIDE-FORMOTEROL FUMARATE 160-4.5 MCG/ACT IN AERO: 2.0000 | INHALATION_SPRAY | Freq: Two times a day (BID) | RESPIRATORY_TRACT | 6 refills | Status: AC

## 2023-10-16 MED ORDER — BENZTROPINE MESYLATE 1 MG PO TABS: 1.0000 mg | ORAL_TABLET | Freq: Three times a day (TID) | ORAL | 2 refills | Status: AC

## 2023-10-16 MED ORDER — LITHIUM CARBONATE 300 MG PO TABS: 300.0000 mg | ORAL_TABLET | Freq: Every day | ORAL | 2 refills | Status: AC

## 2023-10-16 MED ORDER — DIVALPROEX SODIUM 500 MG PO DR TAB: 1000.0000 mg | DELAYED_RELEASE_TABLET | Freq: Two times a day (BID) | ORAL | 0 refills | Status: AC

## 2023-10-16 MED ORDER — DILTIAZEM HCL ER COATED BEADS 360 MG PO CP24
360.0000 mg | ORAL_CAPSULE | Freq: Every day | ORAL | 11 refills | Status: DC
Start: 2023-10-16 — End: 2024-03-06

## 2023-10-16 MED ORDER — PALIPERIDONE ER 9 MG PO TB24: 9.0000 mg | ORAL_TABLET | ORAL | 0 refills | Status: AC

## 2023-10-16 MED ORDER — TRULICITY 3 MG/0.5ML ~~LOC~~ SOAJ: 3.0000 mg | SUBCUTANEOUS | 0 refills | Status: AC

## 2023-10-16 MED ORDER — APIXABAN 5 MG PO TABS: 5.0000 mg | ORAL_TABLET | Freq: Two times a day (BID) | ORAL | 0 refills | Status: DC

## 2023-10-16 MED ORDER — FUROSEMIDE 20 MG PO TABS
20.0000 mg | ORAL_TABLET | Freq: Every day | ORAL | 11 refills | Status: DC
Start: 2023-10-16 — End: 2023-12-07

## 2023-10-16 NOTE — NC FL2 (Signed)
 [place]  MEDICAID FL2 LEVEL OF CARE FORM     IDENTIFICATION  Patient Name: Daniel [name] Birthdate: 1991/03/15 Sex: male Admission Date (Current Location): 10/02/2023  [place] and [place] Number:  Chiropodist and Address:  [facility], [address], [place], [place] [address]      Provider Number: [id number]  Attending Physician Name and Address:  No att. providers found  Relative Name and Phone Number:  [name] (Legal Guardian)  [phone]    Current Level of Care: Hospital Recommended Level of Care: Family Care Home (behavioral health) Prior Approval Number:    Date Approved/Denied:   PASRR Number:    Discharge Plan: Home    Current Diagnoses: Patient Active Problem List   Diagnosis Date Noted   Chronic heart failure with preserved ejection fraction (HFpEF) (HCC) 07/05/2023   Hyperlipidemia LDL goal <70 07/05/2023   PAF (paroxysmal atrial fibrillation) (HCC) 06/06/2023   DOE (dyspnea on exertion) 02/10/2023   Cellulitis of left lower extremity 11/02/2022   Congestive heart failure (HCC) 10/07/2022   Severe sleep apnea 09/14/2022   Persistent atrial fibrillation (HCC) 02/09/2022   Cardiomyopathy (HCC) 01/27/2022   Hypersomnia 11/08/2021   Acute metabolic encephalopathy 09/24/2021   Valproic acid  toxicity 09/24/2021   Hyperammonemia (HCC) 09/24/2021   Generalized anxiety disorder 09/24/2021   Hyperkalemia 09/08/2021   Chronic atrial fibrillation with RVR (HCC)    Urinary obstruction 09/05/2021   Inability to urinate    Conversion disorder    Syncope and collapse 08/28/2021   History of gout 08/28/2021   Suicidal ideation    Schizoaffective disorder, bipolar type (HCC) 08/14/2021   Chronic hypoxemic respiratory failure (HCC) 08/14/2021   Suspected sleep apnea    Diabetes mellitus type 2, uncomplicated (HCC)    Left-sided weakness 08/02/2021   Morbid obesity (HCC) 08/02/2021   Chronic atrial fibrillation  (HCC) 08/02/2021   Chest pain 08/02/2021   Essential hypertension 08/04/2020    Orientation RESPIRATION BLADDER Height & Weight     Self, Time, Situation, Place  Normal Continent Weight: (!) 174.2 kg Height:  6' 3.5" (191.8 cm)  BEHAVIORAL SYMPTOMS/MOOD NEUROLOGICAL BOWEL NUTRITION STATUS     (N/A) Continent Diet (regular, thin liquids)  AMBULATORY STATUS COMMUNICATION OF NEEDS Skin   Independent Verbally Normal                       Personal Care Assistance Level of Assistance   (He's able to complete his own ADL's)           Functional Limitations Info   (none)          SPECIAL CARE FACTORS FREQUENCY  Blood pressure                    Contractures Contractures Info: Not present    Additional Factors Info  Psychotropic     Psychotropic Info: Lithium , Invega , Benztropine , Depakote , Lexapro          Current Medications (10/16/2023):  This is the current hospital active medication list Current Facility-Administered Medications  Medication Dose Route Frequency Provider Last Rate Last Admin   acetaminophen  (TYLENOL ) tablet 650 mg  650 mg Oral Q4H PRN Twilla Galea, MD   650 mg at 10/14/23 2120   albuterol  (PROVENTIL ) (2.5 MG/3ML) 0.083% nebulizer solution 2.5 mg  2.5 mg Inhalation Q6H PRN Twilla Galea, MD   2.5 mg at 10/12/23 2324   allopurinol  (ZYLOPRIM ) tablet 300 mg  300 mg Oral Daily Jessup, Charles, MD  300 mg at 10/16/23 1610   apixaban  (ELIQUIS ) tablet 5 mg  5 mg Oral BID Jessup, Charles, MD   5 mg at 10/16/23 0915   atorvastatin  (LIPITOR ) tablet 80 mg  80 mg Oral Daily Jessup, Charles, MD   80 mg at 10/16/23 0915   baclofen  (LIORESAL ) tablet 10 mg  10 mg Oral BID Jessup, Charles, MD   10 mg at 10/16/23 0915   benztropine  (COGENTIN ) tablet 1 mg  1 mg Oral TID Jessup, Charles, MD   1 mg at 10/16/23 0915   dapagliflozin  propanediol (FARXIGA ) tablet 10 mg  10 mg Oral QAC breakfast Jessup, Charles, MD   10 mg at 10/16/23 9604   diltiazem  (CARDIZEM   CD) 24 hr capsule 360 mg  360 mg Oral Daily Greenwood, Howard F, RPH   360 mg at 10/16/23 5409   divalproex  (DEPAKOTE ) DR tablet 1,000 mg  1,000 mg Oral BID Jessup, Charles, MD   1,000 mg at 10/16/23 0915   escitalopram  (LEXAPRO ) tablet 20 mg  20 mg Oral Daily Jessup, Charles, MD   20 mg at 10/16/23 8119   fluticasone  furoate-vilanterol (BREO ELLIPTA ) 200-25 MCG/ACT 1 puff  1 puff Inhalation Daily Jessup, Charles, MD   1 puff at 10/16/23 1478   furosemide  (LASIX ) tablet 20 mg  20 mg Oral Daily Jessup, Charles, MD   20 mg at 10/16/23 2956   hydrOXYzine  (ATARAX ) tablet 25 mg  25 mg Oral TID PRN Arline Bennett, MD   25 mg at 10/15/23 1154   lithium  carbonate (LITHOBID ) ER tablet 300 mg  300 mg Oral q AM Arline Bennett, MD   300 mg at 10/16/23 2130   lithium  carbonate (LITHOBID ) ER tablet 600 mg  600 mg Oral QPM Arline Bennett, MD   600 mg at 10/15/23 1717   melatonin tablet 5 mg  5 mg Oral QHS Jessup, Charles, MD   5 mg at 10/15/23 2129   metoprolol  succinate (TOPROL -XL) 24 hr tablet 150 mg  150 mg Oral Daily Jessup, Charles, MD   150 mg at 10/16/23 0915   paliperidone  (INVEGA ) 24 hr tablet 9 mg  9 mg Oral q morning Twilla Galea, MD   9 mg at 10/16/23 8657   sacubitril -valsartan  (ENTRESTO ) 24-26 mg per tablet  1 tablet Oral BID Jessup, Charles, MD   1 tablet at 10/16/23 8469   Current Outpatient Medications  Medication Sig Dispense Refill   acetaminophen  (TYLENOL ) 325 MG tablet Take 2 tablets (650 mg total) by mouth every 4 (four) hours as needed. 100 tablet 2   albuterol  (VENTOLIN  HFA) 108 (90 Base) MCG/ACT inhaler Inhale 2 puffs into the lungs every 6 (six) hours as needed for wheezing. 1 each 2   allopurinol  (ZYLOPRIM ) 300 MG tablet Take 1 tablet (300 mg total) by mouth daily. 30 tablet 0   apixaban  (ELIQUIS ) 5 MG TABS tablet @ TAKE 1 TABLET BY MOUTH TWICE DAILY 60 tablet 11   atorvastatin  (LIPITOR ) 80 MG tablet Take 1 tablet (80 mg total) by mouth daily. 30 tablet 10   baclofen  (LIORESAL ) 10 MG  tablet Take 1 tablet (10 mg total) by mouth 2 (two) times daily as needed for muscle spasms. Home med. (Patient taking differently: Take 10 mg by mouth 2 (two) times daily. Home med.) 30 each 0   benztropine  (COGENTIN ) 1 MG tablet Take 1 mg by mouth 3 (three) times daily.     budesonide -formoterol  (SYMBICORT ) 160-4.5 MCG/ACT inhaler Inhale 2 puffs into the lungs 2 (two) times daily. 1  each 6   diltiazem  (CARDIZEM  CD) 360 MG 24 hr capsule @ TAKE 1 CAPSULE BY MOUTH ONCE DAILY *DO NOT CRUSH OR CHEW* 30 capsule 5   divalproex  (DEPAKOTE ) 500 MG DR tablet Take 2 tablets (1,000 mg total) by mouth 2 (two) times daily. (Patient taking differently: Take 1,000 mg by mouth daily at 6 (six) AM. 1000mg  QAM)     Dulaglutide  (TRULICITY ) 3 MG/0.5ML SOAJ Inject 3 mg as directed once a week. (Patient taking differently: Inject 3 mg as directed every Sunday.) 2 mL 5   escitalopram  (LEXAPRO ) 10 MG tablet Take 20 mg by mouth daily.     fluticasone  (FLONASE ) 50 MCG/ACT nasal spray Place 2 sprays into both nostrils 2 (two) times daily. 1 g 10   furosemide  (LASIX ) 20 MG tablet Take 1 tablet (20 mg total) by mouth daily. 30 tablet 11   hydrOXYzine  (ATARAX ) 25 MG tablet Take 25-50 mg by mouth in the morning, at noon, and at bedtime. 50 mg in morning and bedtime: 25 mg at noon     INVEGA  9 MG 24 hr tablet Take 9 mg by mouth every morning.     lithium  carbonate (LITHOBID ) 300 MG CR tablet Take 300-600 mg by mouth 2 (two) times daily. 1 tab (300 mg) every AM, 2 tab (600mg ) every PM     loratadine  (CLARITIN ) 10 MG tablet Take 10 mg by mouth daily.     melatonin 3 MG TABS tablet Take 3 mg by mouth at bedtime.     metoprolol  succinate (TOPROL -XL) 100 MG 24 hr tablet TAKE 1 AND 1/2 TABLET (150MG ) BY MOUTH ONCE DAILY  WITH OR IMMEDIATELY FOLLOWING A MEAL. 42 tablet 9   Multiple Vitamins-Minerals (MULTIVITAMIN GUMMIES ADULT PO) Take by mouth.     ondansetron  (ZOFRAN -ODT) 4 MG disintegrating tablet Take 1 tablet (4 mg total) by mouth  every 8 (eight) hours as needed. 20 tablet 0   propranolol (INDERAL) 10 MG tablet Take 10 mg by mouth 2 (two) times daily.     sacubitril -valsartan  (ENTRESTO ) 24-26 MG TAKE 1 TABLET BY MOUTH TWICE DAILY 60 tablet 11   sodium chloride  (OCEAN) 0.65 % SOLN nasal spray Place 1 spray into both nostrils as needed for congestion. 10 mL 5   tamsulosin  (FLOMAX ) 0.4 MG CAPS capsule Take 1 capsule (0.4 mg total) by mouth daily. 30 capsule 2   CRANBERRY EXTRACT PO Take by mouth.     cyclobenzaprine  (FLEXERIL ) 5 MG tablet Take 5 mg by mouth 3 (three) times daily as needed for muscle spasms. (Patient not taking: Reported on 10/03/2023)     dapagliflozin  propanediol (FARXIGA ) 10 MG TABS tablet @ TAKE 1 TABLET BY MOUTH ONCE DAILY BEFORE BREAKFAST 30 tablet 5   mupirocin ointment (BACTROBAN) 2 % SMARTSIG:1 Application Topical 2-3 Times Daily (Patient not taking: Reported on 09/05/2023)     spironolactone  (ALDACTONE ) 25 MG tablet Take 0.5 tablets (12.5 mg total) by mouth daily. 45 tablet 3     Discharge Medications: Please see discharge summary for a list of discharge medications.  Relevant Imaging Results:  Relevant Lab Results:   Additional Information ss# [id number]  Arminda Landmark, RN

## 2023-10-16 NOTE — ED Notes (Addendum)
 Breakfast provided.

## 2023-10-16 NOTE — ED Provider Notes (Signed)
 Emergency Medicine Observation Re-evaluation Note  Daniel [name] is a 33 y.o. male, seen on rounds today.  Pt initially presented to the ED for complaints of Chest Pain  Currently, the patient is resting in bed. No reported issues from nursing team.   Physical Exam  BP 126/77 (BP Location: Right Arm)   Pulse 83   Temp 98.6 F (37 C) (Oral)   Resp 18   Ht 6' 3.5" (1.918 m)   Wt (!) 174.2 kg   SpO2 95%   BMI 47.36 kg/m  Physical Exam General: Resting in bed  ED Course / MDM   No labs last 24 hours.  Plan  Current plan is for dispo per social work.    Claria Crofts, MD 10/16/23 [phone]

## 2023-10-16 NOTE — Progress Notes (Deleted)
 Advanced Heart Failure Clinic Note      PCP: Marcial Setting, NP (at group home) Primary Cardiologist: Sammy Crisp, MD (last seen 02/25) HF provider: Peder Bourdon, MD   Chief Complaint: shortness of breath  HPI:  Daniel [name] is a 33 y/o male with a history of DM, hyperlipidemia, HTN, paroxysmal atrial fibrillation, conversion disorder, sleep apnea, schizoaffective disorder, seizure, TIA, urethral stricture, former tobacco use and chronic heart failure. Patient has had a long history of paroxysmal AF.  He says this was diagnosed when he was 20 and he was told that it was triggered by excessive use of energy drinks.  He never abused drugs or ETOH per his report though he was a smoker until 5/23.  He was followed in [place] in the past. There was talk of atrial fibrillation ablation but it was never undertaken. Subsequently, he moved to a group home in [place].    Patient has had home oxygen since 2022 per his report.  High resolution CT chest in 2/24 showed no ILD, possible small airways disease.  PFTs in 2/24 showed minimal obstructive airways disease.  Sleep study showed severe OSA.  Was in the ED 01/14/23 due to increasing swelling in both of his legs along with some difficulty breathing over the past 2 days. EKG shows no evidence of arrhythmia or ischemia and troponin within normal limits, chest x-ray negative for acute process.  No significant anemia, leukocytosis, tract abnormality, or AKI noted. Urinalysis with no signs of infection. IV lasix  given. Was in the ED 01/23/23 due to shortness of breath and chest pain that started earlier today. Also complains of swelling in bilateral legs. EKG chest x-ray labs and vital signs are normal. IV lasix  provided with higher outpatient lasix  dose for a few days. Was in the ED 02/02/23 due to acute left-sided weakness. Patient reports he was awake at that time and around 1:40 AM developed sudden nausea, pulsating in his head and a pounding  headache with diffuse blurry vision throughout his visual fields. CT and angiogram are reassuring, no LVO. Subsequent MRI brain without evidence of acute pathology or acute strokes. With management of his migraine with Fioricet , Benadryl  and Compazine , his symptoms resolved and neurologically back to baseline. Normal metabolic panel and CBC. Had 2 ED visits 09/24 for atypical chest pain and cystitis. ED visit 03/06/23 due to blood in the stool and severe abdominal pain. Abdominal CT negative. Was in the ED 04/04/23 due to fall, abdominal pain, near syncope and nausea. CT concerning for possible cystitis but UA is reassuring so does not seem to be UTI. The rest of his CT scans were all reassuring.   Was in the ED 08/28/23 with chest pain along with feeling dizzy & then hot. Recently finished a course of antibiotics for urinary symptoms. CXR clear. Mildly elevated BNP at 180.Respiratory panel and UA negative. Given increase lasix  dose for 5 days and he was released.   He presents today for a HF f/u visit with a chief complaint of shortness of breath. Has associated fatigue, wheezing, edema, occasional chest pain and dizziness. Denies abdominal distention or difficulty sleeping. Does admit to sleeping more during the day which then makes it difficult to sleep at night.   Ate 2 packages of ramen noodles w/ seasoning packet yesterday. Tends to eat sausage patties twice weekly.      Cardiac Test Echo 08/04/21: EF of 50% along with mild LVH Echo 10/22/21: EF of 45-50%. Echo 08/22/22: EF 55-60%.    RHC  10/07/22: Hemodynamics (mmHg) RA mean 2 RV 20/2 PA 23/8, mean 15 PCWP mean 3 Oxygen saturations: PA 74% AO 96% Cardiac Output (Fick) 9.71  Cardiac Index (Fick) 3.34   ROS: All systems negative except as listed in HPI, PMH and Problem List.  SH:  Social History   Socioeconomic History   Marital status: Single    Spouse name: Not on file   Number of children: Not on file   Years of education: Not on  file   Highest education level: Not on file  Occupational History   Not on file  Tobacco Use   Smoking status: Former    Current packs/day: 0.00    Types: Cigarettes    Start date: 08/2019    Quit date: 08/2021    Years since quitting: 2.1   Smokeless tobacco: Never  Vaping Use   Vaping status: Never Used  Substance and Sexual Activity   Alcohol use: Not Currently   Drug use: Never   Sexual activity: Not Currently  Other Topics Concern   Not on file  Social History Narrative   Now living in a group home in [place].   Social Drivers of Health   Financial Resource Strain: Low Risk  (03/16/2021)   Received from [employer], [facility]   Overall Financial Resource Strain (CARDIA)    Difficulty of Paying Living Expenses: Not very hard  Food Insecurity: No Food Insecurity (09/01/2020)   Received from [facility], [facility]   Hunger Vital Sign    Worried About Running Out of Food in the Last Year: Never true    Ran Out of Food in the Last Year: Never true  Transportation Needs: Not on file  Physical Activity: Not on file  Stress: No Stress Concern Present (03/16/2021)   Received from [facility], [facility]   [employer] of Occupational [facility] - Occupational Stress Questionnaire    Feeling of Stress : Not at all  Social Connections: Unknown (09/27/2021)   Received from [facility], [facility]   Social Network    Social Network: Not on file  Intimate Partner Violence: Unknown (09/01/2021)   Received from [employer], [facility]   HITS    Physically Hurt: Not on file    Insult or Talk Down To: Not on file    Threaten Physical Harm: Not on file    Scream or Curse: Not on file    FH:  Family History  Problem Relation Age of Onset   Colon cancer Father    Breast cancer Paternal Grandmother     Past Medical History:  Diagnosis Date   Cardiomyopathy (HCC)    a.) TTE 08/04/2021: EF 50%; b.) TTE 10/22/2021: EF 45-50-%; c.) TTE 08/22/2022: EF  55-60%   Chest pain    CHF (congestive heart failure) (HCC)    a.) TTE 08/04/2021: EF 50%, mild LVH, RVSF norm; b.) TTE 10/22/2021: EF 45-50%, glob HK, RVSF norm; c.) TTE 08/22/2022: EF 55-60%, mild dil LV, RVSF norm.   Conversion disorder    Current use of long term anticoagulation    Dyspnea    GERD (gastroesophageal reflux disease)    Hepatic steatosis    Hyperlipidemia    Hypertension    Insomnia    a.) melatonin PRN   Long term current use of anticoagulant    a.) apixaban    OSA on CPAP    Persistent atrial fibrillation and flutter (HCC)    a.) CHA2DS2VASc = 5 (CHF, HTN, TIA x 2,  T2DM);  b.) rate/rhythm maintained on oral diltiazem  + metoprolol  succinate; chronically anticoagulated with apixaban    Schizoaffective disorder (HCC)    Seizure disorder (HCC)    T2DM (type 2 diabetes mellitus) (HCC)    TIA (transient ischemic attack)    Urethral stricture    a. 08/2021 s/p cystoscopy and urethral dilation.    Current Outpatient Medications  Medication Sig Dispense Refill   acetaminophen  (TYLENOL ) 325 MG tablet Take 2 tablets (650 mg total) by mouth every 4 (four) hours as needed. 100 tablet 2   albuterol  (PROVENTIL ) (2.5 MG/3ML) 0.083% nebulizer solution Take 3 mLs (2.5 mg total) by nebulization every 6 (six) hours as needed for wheezing or shortness of breath. 75 mL 2   allopurinol  (ZYLOPRIM ) 300 MG tablet Take 1 tablet (300 mg total) by mouth daily. 30 tablet 2   apixaban  (ELIQUIS ) 5 MG TABS tablet Take 1 tablet (5 mg total) by mouth 2 (two) times daily. 60 tablet 0   atorvastatin  (LIPITOR ) 80 MG tablet Take 1 tablet (80 mg total) by mouth daily. 30 tablet 11   baclofen  (LIORESAL ) 10 MG tablet Take 1 tablet (10 mg total) by mouth 2 (two) times daily. 60 tablet 1   benztropine  (COGENTIN ) 1 MG tablet Take 1 tablet (1 mg total) by mouth in the morning, at noon, and at bedtime. 30 tablet 2   budesonide -formoterol  (SYMBICORT ) 160-4.5 MCG/ACT inhaler Inhale 2 puffs into the lungs 2 (two)  times daily. 1 each 6   CRANBERRY EXTRACT PO Take by mouth.     cyclobenzaprine  (FLEXERIL ) 5 MG tablet Take 5 mg by mouth 3 (three) times daily as needed for muscle spasms. (Patient not taking: Reported on 10/03/2023)     dapagliflozin  propanediol (FARXIGA ) 10 MG TABS tablet Take 1 tablet (10 mg total) by mouth daily before breakfast. 30 tablet 0   diltiazem  (CARDIZEM  CD) 360 MG 24 hr capsule Take 1 capsule (360 mg total) by mouth daily. 30 capsule 11   divalproex  (DEPAKOTE ) 500 MG DR tablet Take 2 tablets (1,000 mg total) by mouth 2 (two) times daily. 120 tablet 0   [START ON 10/22/2023] Dulaglutide  (TRULICITY ) 3 MG/0.5ML SOAJ Inject 3 mg as directed every Sunday. 2 mL 0   escitalopram  (LEXAPRO ) 20 MG tablet Take 1 tablet (20 mg total) by mouth daily. 30 tablet 2   fluticasone  (FLONASE ) 50 MCG/ACT nasal spray Place 2 sprays into both nostrils 2 (two) times daily. 1 g 10   furosemide  (LASIX ) 20 MG tablet Take 1 tablet (20 mg total) by mouth daily. 30 tablet 11   hydrOXYzine  (ATARAX ) 25 MG tablet Take 1 tablet (25 mg total) by mouth 3 (three) times daily as needed for up to 10 days for anxiety. 30 tablet 0   lithium  300 MG tablet Take 1 tablet (300 mg total) by mouth daily. 30 tablet 2   lithium  300 MG tablet Take 2 tablets (600 mg total) by mouth every evening. 30 tablet 2   loratadine  (CLARITIN ) 10 MG tablet Take 10 mg by mouth daily.     melatonin 5 MG TABS Take 1 tablet (5 mg total) by mouth at bedtime. 30 tablet 0   metoprolol  succinate (TOPROL  XL) 100 MG 24 hr tablet Take 1.5 tablets (150 mg total) by mouth daily. Take with or immediately following a meal. 45 tablet 0   Multiple Vitamins-Minerals (MULTIVITAMIN GUMMIES ADULT PO) Take by mouth.     mupirocin ointment (BACTROBAN) 2 % SMARTSIG:1 Application Topical 2-3 Times Daily (Patient not  taking: Reported on 09/05/2023)     ondansetron  (ZOFRAN -ODT) 4 MG disintegrating tablet Take 1 tablet (4 mg total) by mouth every 8 (eight) hours as needed. 20  tablet 0   paliperidone  (INVEGA ) 9 MG 24 hr tablet Take 1 tablet (9 mg total) by mouth every morning. 30 tablet 0   propranolol (INDERAL) 10 MG tablet Take 10 mg by mouth 2 (two) times daily.     sacubitril -valsartan  (ENTRESTO ) 24-26 MG Take 1 tablet by mouth 2 (two) times daily. 60 tablet 0   sodium chloride  (OCEAN) 0.65 % SOLN nasal spray Place 1 spray into both nostrils as needed for congestion. 10 mL 5   spironolactone  (ALDACTONE ) 25 MG tablet Take 0.5 tablets (12.5 mg total) by mouth daily. 45 tablet 3   tamsulosin  (FLOMAX ) 0.4 MG CAPS capsule Take 1 capsule (0.4 mg total) by mouth daily. 30 capsule 2   No current facility-administered medications for this visit.   There were no vitals filed for this visit.  Wt Readings from Last 3 Encounters:  10/14/23 (!) 384 lb (174.2 kg)  09/20/23 (!) 355 lb 6.4 oz (161.2 kg)  09/05/23 (!) 355 lb (161 kg)   Lab Results  Component Value Date   CREATININE 0.83 10/09/2023   CREATININE 1.10 10/02/2023   CREATININE 0.97 09/20/2023    PHYSICAL EXAM:  General: Well appearing. No resp difficulty HEENT: normal Neck: supple, no JVD Cor: Regular rhythm, rate. No rubs, gallops or murmurs Lungs: clear Abdomen: soft, nontender, nondistended. Extremities: no cyanosis, clubbing, rash, 2+ pitting edema bilateral lower legs up to knees Neuro: alert & oriented X 3. Moves all 4 extremities w/o difficulty. Affect pleasant   EKG: not done  ReDs reading: 40 %, abnormal (see plan below)   ASSESSMENT & PLAN:  1: Chronic HFpEF - suspect due to AF/ OSA - Echo 08/04/21: EF of 50% along with mild LVH - Echo 10/22/21: EF of 45-50%. - Echo 08/22/22: EF 55-60%.   - NYHA II.  - fluid up with increased symptoms, worsening edema and elevated ReDs - ReDs: 40% - increase furosemide  to 40mg  daily X 3 days, then decrease back to 20mg  daily - BMET/ proBNP today; repeat next week - weight unchanged from last visit here 3 months ago - continue farxiga  10mg  daily -  furosemide  adjusted above - continue metoprolol  succinate 150mg  daily - continue entresto  24/26mg  BID - continue spironolactone  12.5mg  daily - reviewed diet extensively regarding high sodium content of ramen noodles and that he should NOT use the seasoning packet - BNP 08/09/23 was 180.7  2: HTN - BP 110/73 - BMET 08/09/23 reviewed: sodium 138, potassium 4.0, creatinine 0.78 & GFR >60 - BMET today  3: Paroxysmal atrial fibrillation- - Regular on exam.  - Continue metoprolol  succinate 150mg  daily - continue diltiazem  360mg  daily - Continue eliquis  5 mg twice a day.  - saw cardiology (End) 02/25  4: Obesity- - continue trulicity  3mg  weekly  5: OSA - Using Bipap nightly - saw pulmonology (K[place]) 04/25  6: DM- - A1c 08/05/22 was 5.8% - continue atorvastatin  80mg  daily; check lipid panel next visit   Return in 1 week, sooner if needed.   Charlette Console NP-C  6:05 PM      Charlette Console, [place] 10/16/23

## 2023-10-16 NOTE — ED Notes (Signed)
Pt requested shower; provided clean hospital clothing and linens.  Shower setup provided with soap, shampoo, toothbrush/toothpaste, and deodorant.  Pt able to preform own ADL's with no assistance.    

## 2023-10-16 NOTE — ED Notes (Signed)
 RN called the listed below, no answer. Left VM requesting return phone call.  Daniel [name] Legal Guardian Emergency Contact [phone]

## 2023-10-16 NOTE — ED Notes (Addendum)
 Pt alert, cooperative, appropriate for discharge. Daniel [name], sent by guardian at bedside of patient, voices understanding of discharge instructions and appropriate follow up if needed. Pt in NAD. Safe for discharge.

## 2023-10-16 NOTE — ED Notes (Signed)
 RN called the listed below, no answer.   Daniel [name] Legal Guardian Emergency Contact [phone]

## 2023-10-16 NOTE — TOC Progression Note (Addendum)
 Transition of Care [facility]) - Progression Note    Patient Details  Name: Daniel [name] MRN: [id number] Date of Birth: 19-May-1991  [facility] CM/SW Contact  Arminda Landmark, RN Phone Number: 10/16/2023, 10:25 AM  Clinical Narrative:         Barriers to Discharge: Barriers Resolved  Expected Discharge Plan and Services  Received epic chat that Evan is being picked up this morning and they need the FL2. There are no TOC notes from over the weekend about him being discharged today. Bedside nursing staff has been made aware and has been communicating with his guardian Paola Bohr. Spoke to [facility] and she confirms he's been accepted at [facility]. Edwina Gram is the Psychiatrist and her P# is [phone]. 1030: Spoke with Paola Bohr again and she thinks the [place] came in this morning. She doesn't have the auth #. She's requesting we send prescriptions with patient. Dr. Johnetta Nab notified and will provide the prescriptions. TOC to sign off.                        DME Arranged:  (none required)                     Social Determinants of Health (SDOH) Interventions SDOH Screenings   Food Insecurity: No Food Insecurity (09/01/2020)   Received from [facility], [facility]  Depression [phone]): Low Risk  (05/17/2023)  Financial Resource Strain: Low Risk  (03/16/2021)   Received from [facility], [facility]  Social Connections: Unknown (09/27/2021)   Received from [facility], [facility]  Stress: No Stress Concern Present (03/16/2021)   [facility]  Tobacco Use: Medium Risk (10/02/2023)    Readmission Risk Interventions    09/06/2021    9:33 AM  Readmission Risk Prevention Plan  Transportation Screening Complete  PCP or Specialist Appt within 3-5 Days Complete  HRI or Home Care Consult Complete  Social Work Consult for Recovery Care Planning/Counseling Not Complete  SW consult not completed comments RNCM assigned to case   Palliative Care Screening Not Applicable  Medication Review Oceanographer) Complete

## 2023-10-16 NOTE — ED Provider Notes (Addendum)
 Called [facility]- to verify meds- Pt spirolactone expired. Did recommend doing Trulicity .      Lubertha Rush, MD 10/16/23 1119    Lubertha Rush, MD 10/16/23 1145

## 2023-10-16 NOTE — Telephone Encounter (Signed)
 Called to confirm/remind patient of their appointment at the [facility] Clinic on 10/17/23.   Appointment:   [x] Confirmed  [] Left mess   [] No answer/No voice mail  [] VM Full/unable to leave message  [] Phone not in service  Patient reminded to bring all medications and/or complete list.  Confirmed patient has transportation. Gave directions, instructed to utilize valet parking.

## 2023-10-17 ENCOUNTER — Encounter: Payer: MEDICAID | Admitting: Family

## 2023-10-17 ENCOUNTER — Telehealth: Payer: Self-pay | Admitting: Family

## 2023-10-17 NOTE — Telephone Encounter (Signed)
 Patient did not show for his Heart Failure Clinic appointment on 10/17/23.

## 2023-10-19 ENCOUNTER — Ambulatory Visit: Payer: MEDICAID | Admitting: Dietician

## 2023-11-01 ENCOUNTER — Other Ambulatory Visit: Payer: MEDICAID | Admitting: Urology

## 2023-11-14 ENCOUNTER — Encounter: Payer: Self-pay | Admitting: Urology

## 2023-12-06 ENCOUNTER — Telehealth: Payer: Self-pay | Admitting: Family

## 2023-12-06 NOTE — Telephone Encounter (Signed)
 Called to confirm/remind patient of their appointment at the [facility] on 12/07/23.   Appointment:   [x] Confirmed  [] Left mess   [] No answer/No voice mail  [] VM Full/unable to leave message  [] Phone not in service  Patient reminded to bring all medications and/or complete list.  Confirmed patient has transportation. Gave directions, instructed to utilize valet parking.

## 2023-12-07 ENCOUNTER — Encounter: Payer: Self-pay | Admitting: Family

## 2023-12-07 ENCOUNTER — Other Ambulatory Visit
Admission: RE | Admit: 2023-12-07 | Discharge: 2023-12-07 | Disposition: A | Discharge status: Home / Self Care | Payer: MEDICAID | Source: Ambulatory Visit | Attending: Family | Admitting: Family

## 2023-12-07 ENCOUNTER — Ambulatory Visit (HOSPITAL_BASED_OUTPATIENT_CLINIC_OR_DEPARTMENT_OTHER): Payer: MEDICAID | Admitting: Family

## 2023-12-07 VITALS — BP 118/55 | HR 62 | Wt 361.0 lb

## 2023-12-07 DIAGNOSIS — I480 Paroxysmal atrial fibrillation: Secondary | ICD-10-CM | POA: Insufficient documentation

## 2023-12-07 DIAGNOSIS — Z8673 Personal history of transient ischemic attack (TIA), and cerebral infarction without residual deficits: Secondary | ICD-10-CM | POA: Diagnosis not present

## 2023-12-07 DIAGNOSIS — I5032 Chronic diastolic (congestive) heart failure: Secondary | ICD-10-CM

## 2023-12-07 DIAGNOSIS — R0602 Shortness of breath: Secondary | ICD-10-CM | POA: Insufficient documentation

## 2023-12-07 DIAGNOSIS — I110 Hypertensive heart disease with heart failure: Secondary | ICD-10-CM | POA: Diagnosis not present

## 2023-12-07 DIAGNOSIS — Z7901 Long term (current) use of anticoagulants: Secondary | ICD-10-CM | POA: Insufficient documentation

## 2023-12-07 DIAGNOSIS — G4733 Obstructive sleep apnea (adult) (pediatric): Secondary | ICD-10-CM | POA: Insufficient documentation

## 2023-12-07 DIAGNOSIS — E785 Hyperlipidemia, unspecified: Secondary | ICD-10-CM | POA: Diagnosis not present

## 2023-12-07 DIAGNOSIS — E119 Type 2 diabetes mellitus without complications: Secondary | ICD-10-CM | POA: Diagnosis not present

## 2023-12-07 DIAGNOSIS — R5383 Other fatigue: Secondary | ICD-10-CM | POA: Insufficient documentation

## 2023-12-07 DIAGNOSIS — I10 Essential (primary) hypertension: Secondary | ICD-10-CM

## 2023-12-07 DIAGNOSIS — Z87891 Personal history of nicotine dependence: Secondary | ICD-10-CM | POA: Insufficient documentation

## 2023-12-07 DIAGNOSIS — Z7984 Long term (current) use of oral hypoglycemic drugs: Secondary | ICD-10-CM | POA: Insufficient documentation

## 2023-12-07 DIAGNOSIS — E669 Obesity, unspecified: Secondary | ICD-10-CM | POA: Diagnosis not present

## 2023-12-07 DIAGNOSIS — Z79899 Other long term (current) drug therapy: Secondary | ICD-10-CM | POA: Insufficient documentation

## 2023-12-07 DIAGNOSIS — R600 Localized edema: Secondary | ICD-10-CM | POA: Diagnosis not present

## 2023-12-07 DIAGNOSIS — Z7985 Long-term (current) use of injectable non-insulin antidiabetic drugs: Secondary | ICD-10-CM | POA: Insufficient documentation

## 2023-12-07 DIAGNOSIS — F259 Schizoaffective disorder, unspecified: Secondary | ICD-10-CM | POA: Insufficient documentation

## 2023-12-07 LAB — BASIC METABOLIC PANEL WITH GFR
Anion gap: 12 (ref 5–15)
BUN: 16 mg/dL (ref 6–20)
CO2: 26 mmol/L (ref 22–32)
Calcium: 9.2 mg/dL (ref 8.9–10.3)
Chloride: 98 mmol/L (ref 98–111)
Creatinine, Ser: 0.72 mg/dL (ref 0.61–1.24)
GFR, Estimated: >60 mL/min (ref >60)
Glucose, Bld: 89 mg/dL (ref 70–99)
Potassium: 4.3 mmol/L (ref 3.5–5.1)
Sodium: 136 mmol/L (ref 135–145)

## 2023-12-07 LAB — LIPID PANEL
Cholesterol: 117 mg/dL (ref 0–200)
HDL: 28 mg/dL — ABNORMAL LOW (ref >40)
LDL Cholesterol: 29 mg/dL (ref 0–99)
Total CHOL/HDL Ratio: 4.2 ratio
Triglycerides: 301 mg/dL — ABNORMAL HIGH (ref <150)
VLDL: 60 mg/dL — ABNORMAL HIGH (ref 0–40)

## 2023-12-07 LAB — BRAIN NATRIURETIC PEPTIDE: B Natriuretic Peptide: 139.6 pg/mL — ABNORMAL HIGH (ref 0.0–100.0)

## 2023-12-07 MED ORDER — FUROSEMIDE 20 MG PO TABS: 20.0000 mg | ORAL_TABLET | Freq: Every day | ORAL | 11 refills | Status: DC

## 2023-12-07 MED ORDER — POTASSIUM CHLORIDE CRYS ER 20 MEQ PO TBCR: 20.0000 meq | EXTENDED_RELEASE_TABLET | Freq: Every day | ORAL | 5 refills | Status: AC

## 2023-12-07 MED ORDER — FUROSEMIDE 20 MG PO TABS: 60.0000 mg | ORAL_TABLET | Freq: Every day | ORAL | 3 refills | Status: DC

## 2023-12-07 NOTE — Progress Notes (Signed)
 PCP: Gwenith Shuck, NP (at group home) Primary Cardiologist: Mady Bruckner, MD  HF provider: Rolan Barrack, MD   Chief Complaint: shortness of breath   HPI:  Daniel [name] is a 33 y/o male with a history of DM, hyperlipidemia, HTN, paroxysmal atrial fibrillation, conversion disorder, sleep apnea, schizoaffective disorder, seizure, TIA, urethral stricture, former tobacco use and chronic heart failure. Patient has had a long history of paroxysmal AF.  He says this was diagnosed when he was 20 and he was told that it was triggered by excessive use of energy drinks.  He never abused drugs or ETOH per his report though he was a smoker until 5/23.  He was followed in [place] in the past. There was talk of atrial fibrillation ablation but it was never undertaken. Subsequently, he moved to a group home in [place].    Patient has had home oxygen since 2022 per his report.  High resolution CT chest in 2/24 showed no ILD, possible small airways disease.  PFTs in 2/24 showed minimal obstructive airways disease.  Sleep study showed severe OSA.  Was in the ED 01/14/23 due to increasing swelling in both of his legs along with some difficulty breathing over the past 2 days. EKG shows no evidence of arrhythmia or ischemia and troponin within normal limits, chest x-ray negative for acute process.  No significant anemia, leukocytosis, tract abnormality, or AKI noted. Urinalysis with no signs of infection. IV lasix  given. Was in the ED 01/23/23 due to shortness of breath and chest pain that started earlier today. Also complains of swelling in bilateral legs. EKG chest x-ray labs and vital signs are normal. IV lasix  provided with higher outpatient lasix  dose for a few days. Was in the ED 02/02/23 due to acute left-sided weakness. Patient reports he was awake at that time and around 1:40 AM developed sudden nausea, pulsating in his head and a pounding headache with diffuse blurry vision throughout his visual fields.  CT and angiogram are reassuring, no LVO. Subsequent MRI brain without evidence of acute pathology or acute strokes. With management of his migraine with Fioricet , Benadryl  and Compazine , his symptoms resolved and neurologically back to baseline. Normal metabolic panel and CBC. Had 2 ED visits 09/24 for atypical chest pain and cystitis. ED visit 03/06/23 due to blood in the stool and severe abdominal pain. Abdominal CT negative. Was in the ED 04/04/23 due to fall, abdominal pain, near syncope and nausea. CT concerning for possible cystitis but UA is reassuring so does not seem to be UTI. The rest of his CT scans were all reassuring.   Was in the ED 08/28/23 with chest pain along with feeling dizzy & then hot. Recently finished a course of antibiotics for urinary symptoms. CXR clear. Mildly elevated BNP at 180.Respiratory panel and UA negative. Given increase lasix  dose for 5 days and he was released.   Seen in the [facility] 04/25 where furosemide  was increased to 40mg  X 3 days due to worsening HF symptoms.   Seen twice in ED since HF visit with AMS & then chest pain.   He presents today, with caregivers, for a HF f/u visit with a chief complaint of shortness of breath. Has associated fatigue, chest heaviness, dizziness with sudden position changes, pedal edema, abdominal distention and weight gain. Not wearing bipap consistently when he feels short of breath. Is at a new group home and staff say that they don't cook with salt and use a sodium free seasoning. Continues to wear compression socks  daily.     Cardiac Test Echo 08/04/21: EF of 50% along with mild LVH Echo 10/22/21: EF of 45-50%. Echo 08/22/22: EF 55-60%.    RHC 10/07/22: Hemodynamics (mmHg) RA mean 2 RV 20/2 PA 23/8, mean 15 PCWP mean 3 Oxygen saturations: PA 74% AO 96% Cardiac Output (Fick) 9.71  Cardiac Index (Fick) 3.34   ROS: All systems negative except as listed in HPI, PMH and Problem List.  SH:  Social History   Socioeconomic  History   Marital status: Single    Spouse name: Not on file   Number of children: Not on file   Years of education: Not on file   Highest education level: Not on file  Occupational History   Not on file  Tobacco Use   Smoking status: Former    Current packs/day: 0.00    Types: Cigarettes    Start date: 08/2019    Quit date: 08/2021    Years since quitting: 2.2   Smokeless tobacco: Never  Vaping Use   Vaping status: Never Used  Substance and Sexual Activity   Alcohol use: Not Currently   Drug use: Never   Sexual activity: Not Currently  Other Topics Concern   Not on file  Social History Narrative   Now living in a group home in [place].   Social Drivers of Health   Financial Resource Strain: Low Risk  (03/16/2021)   Received from [facility]   Overall Financial Resource Strain (CARDIA)    Difficulty of Paying Living Expenses: Not very hard  Food Insecurity: No Food Insecurity (09/01/2020)   Received from [facility]   Hunger Vital Sign    Within the past 12 months, you worried that your food would run out before you got the money to buy more.: Never true    Within the past 12 months, the food you bought just didn't last and you didn't have money to get more.: Never true  Transportation Needs: Not on file  Physical Activity: Not on file  Stress: No Stress Concern Present (03/16/2021)   Received from [facility] - Occupational Stress Questionnaire    Feeling of Stress : Not at all  Social Connections: Unknown (09/27/2021)   Received from [facility]   Social Network    Social Network: Not on file  Intimate Partner Violence: Unknown (09/01/2021)   Received from [facility]   HITS    Physically Hurt: Not on file    Insult or Talk Down To: Not on file    Threaten Physical Harm: Not on file    Scream or Curse: Not on file    FH:  Family History  Problem Relation Age of Onset   Colon cancer Father    Breast cancer  Paternal Grandmother     Past Medical History:  Diagnosis Date   Cardiomyopathy (HCC)    a.) TTE 08/04/2021: EF 50%; b.) TTE 10/22/2021: EF 45-50-%; c.) TTE 08/22/2022: EF 55-60%   Chest pain    CHF (congestive heart failure) (HCC)    a.) TTE 08/04/2021: EF 50%, mild LVH, RVSF norm; b.) TTE 10/22/2021: EF 45-50%, glob HK, RVSF norm; c.) TTE 08/22/2022: EF 55-60%, mild dil LV, RVSF norm.   Conversion disorder    Current use of long term anticoagulation    Dyspnea    GERD (gastroesophageal reflux disease)    Hepatic steatosis    Hyperlipidemia    Hypertension    Insomnia  a.) melatonin PRN   Long term current use of anticoagulant    a.) apixaban    OSA on CPAP    Persistent atrial fibrillation and flutter (HCC)    a.) CHA2DS2VASc = 5 (CHF, HTN, TIA x 2, T2DM);  b.) rate/rhythm maintained on oral diltiazem  + metoprolol  succinate; chronically anticoagulated with apixaban    Schizoaffective disorder (HCC)    Seizure disorder (HCC)    T2DM (type 2 diabetes mellitus) (HCC)    TIA (transient ischemic attack)    Urethral stricture    a. 08/2021 s/p cystoscopy and urethral dilation.    Current Outpatient Medications  Medication Sig Dispense Refill   albuterol  (PROVENTIL ) (2.5 MG/3ML) 0.083% nebulizer solution Take 3 mLs (2.5 mg total) by nebulization every 6 (six) hours as needed for wheezing or shortness of breath. 75 mL 2   allopurinol  (ZYLOPRIM ) 300 MG tablet Take 1 tablet (300 mg total) by mouth daily. 30 tablet 2   apixaban  (ELIQUIS ) 5 MG TABS tablet Take 1 tablet (5 mg total) by mouth 2 (two) times daily. 60 tablet 0   atorvastatin  (LIPITOR ) 80 MG tablet Take 1 tablet (80 mg total) by mouth daily. 30 tablet 11   baclofen  (LIORESAL ) 10 MG tablet Take 1 tablet (10 mg total) by mouth 2 (two) times daily. 60 tablet 1   benztropine  (COGENTIN ) 1 MG tablet Take 1 tablet (1 mg total) by mouth in the morning, at noon, and at bedtime. 30 tablet 2   budesonide -formoterol  (SYMBICORT ) 160-4.5  MCG/ACT inhaler Inhale 2 puffs into the lungs 2 (two) times daily. 1 each 6   CRANBERRY EXTRACT PO Take by mouth.     cyclobenzaprine  (FLEXERIL ) 5 MG tablet Take 5 mg by mouth 3 (three) times daily as needed for muscle spasms. (Patient not taking: Reported on 10/03/2023)     diltiazem  (CARDIZEM  CD) 360 MG 24 hr capsule Take 1 capsule (360 mg total) by mouth daily. 30 capsule 11   divalproex  (DEPAKOTE ) 500 MG DR tablet Take 2 tablets (1,000 mg total) by mouth 2 (two) times daily. 120 tablet 0   escitalopram  (LEXAPRO ) 20 MG tablet Take 1 tablet (20 mg total) by mouth daily. 30 tablet 2   fluticasone  (FLONASE ) 50 MCG/ACT nasal spray Place 2 sprays into both nostrils 2 (two) times daily. 1 g 10   furosemide  (LASIX ) 20 MG tablet Take 1 tablet (20 mg total) by mouth daily. 30 tablet 11   lithium  300 MG tablet Take 1 tablet (300 mg total) by mouth daily. 30 tablet 2   lithium  300 MG tablet Take 2 tablets (600 mg total) by mouth every evening. 30 tablet 2   loratadine  (CLARITIN ) 10 MG tablet Take 10 mg by mouth daily.     metoprolol  succinate (TOPROL  XL) 100 MG 24 hr tablet Take 1.5 tablets (150 mg total) by mouth daily. Take with or immediately following a meal. 45 tablet 0   Multiple Vitamins-Minerals (MULTIVITAMIN GUMMIES ADULT PO) Take by mouth.     mupirocin ointment (BACTROBAN) 2 % SMARTSIG:1 Application Topical 2-3 Times Daily (Patient not taking: Reported on 09/05/2023)     ondansetron  (ZOFRAN -ODT) 4 MG disintegrating tablet Take 1 tablet (4 mg total) by mouth every 8 (eight) hours as needed. 20 tablet 0   paliperidone  (INVEGA ) 9 MG 24 hr tablet Take 1 tablet (9 mg total) by mouth every morning. 30 tablet 0   propranolol (INDERAL) 10 MG tablet Take 10 mg by mouth 2 (two) times daily.     sodium chloride  (  OCEAN) 0.65 % SOLN nasal spray Place 1 spray into both nostrils as needed for congestion. 10 mL 5   spironolactone  (ALDACTONE ) 25 MG tablet Take 0.5 tablets (12.5 mg total) by mouth daily. 45 tablet 3    tamsulosin  (FLOMAX ) 0.4 MG CAPS capsule Take 1 capsule (0.4 mg total) by mouth daily. 30 capsule 2   No current facility-administered medications for this visit.   Vitals:   12/07/23 1542  BP: (!) 118/55  Pulse: 62  SpO2: 95%  Weight: (!) 361 lb (163.7 kg)   Wt Readings from Last 3 Encounters:  12/07/23 (!) 361 lb (163.7 kg)  10/14/23 (!) 384 lb (174.2 kg)  09/20/23 (!) 355 lb 6.4 oz (161.2 kg)   Lab Results  Component Value Date   CREATININE 0.83 10/09/2023   CREATININE 1.10 10/02/2023   CREATININE 0.97 09/20/2023    PHYSICAL EXAM:  General: Well appearing. No resp difficulty HEENT: normal Neck: supple, no JVD Cor: Regular rhythm, rate. No rubs, gallops or murmurs Lungs: clear Abdomen: soft, nontender, distended. Extremities: no cyanosis, clubbing, rash, 3+ pitting edema bilateral lower legs Neuro: alert & oriented X 3. Moves all 4 extremities w/o difficulty. Affect pleasant  EKG: not done  ReDs reading: 44 %, abnormal (see plan below)   ASSESSMENT & PLAN:  1: Chronic HFpEF - suspect due to AF/ OSA - Echo 08/04/21: EF of 50% along with mild LVH - Echo 10/22/21: EF of 45-50%. - Echo 08/22/22: EF 55-60%.   - NYHA III  - fluid overload with worsening symptoms, weight gain and elevated ReDs reading - weight up 6 pounds from last visit here 3 months ago - ReDs today 44% - continue farxiga  10mg  daily - increase furosemide  to 60mg  daily/ begin potassium 20meq daily - continue metoprolol  succinate 150mg  daily - continue entresto  24/26mg  BID - continue spironolactone  12.5mg  daily - BNP 08/09/23 was 180.7 - BNP today  2: HTN - BP 118/55 - BMET 10/09/23 reviewed: sodium 135, potassium 4.2, creatinine 0.83 & GFR >60 - BMET today and repeat next week  3: Paroxysmal atrial fibrillation- - Regular on exam.  - Continue metoprolol  succinate 150mg  daily - continue diltiazem  360mg  daily - Continue eliquis  5 mg twice a day.  - saw cardiology (End) 02/25  4: Obesity- -  will restart trulicity  next week; he moved group homes and lost his insurance for a period of time  5: Hyperlipidemia- - lipid panel today - continue atorvastatin  80mg  daily  6: DM- - A1c 08/05/22 was 5.8% - continue atorvastatin  80mg  daily   Return next week, sooner if needed.   Ellouise DELENA Class NP-C  1:34 PM 12/07/23

## 2023-12-07 NOTE — Patient Instructions (Signed)
 Medication Changes:  INCREASE LASIX  TO 60 MG ONCE DAILY  START POTASSIUM 20 MEQ ONCE DAILY   Lab Work:  Go over to the [facility]ICAL [facility]ALL. Go pass the gift shop and have your blood work completed.  We will only call you if the results are abnormal or if the provider would like to make medication changes.   Follow-Up in: 1 WEEK WITH ELLOUISE CLASS, FNP.  At the [facility], you and your health needs are our priority. We have a designated team specialized in the treatment of Heart Failure. This Care Team includes your primary Heart Failure Specialized Cardiologist (physician), Advanced Practice Providers (APPs- Physician Assistants and Nurse Practitioners), and Pharmacist who all work together to provide you with the care you need, when you need it.   You may see any of the following providers on your designated Care Team at your next follow up:  Dr. Toribio Fuel Dr. Ezra Shuck Dr. Ria Commander Dr. Odis Brownie ELLOUISE CLASS, FNP Jaun Bash, RPH-CPP  Please be sure to bring in all your medications bottles to every appointment.   Need to Contact Us :  If you have any questions or concerns before your next appointment please send us  a message through [place] or call our office at [phone].    TO LEAVE A MESSAGE FOR THE NURSE SELECT OPTION 2, PLEASE LEAVE A MESSAGE INCLUDING: YOUR NAME DATE OF BIRTH CALL BACK NUMBER REASON FOR CALL**this is important as we prioritize the call backs  YOU WILL RECEIVE A CALL BACK THE SAME DAY AS LONG AS YOU CALL BEFORE 4:00 PM

## 2023-12-08 ENCOUNTER — Ambulatory Visit: Payer: Self-pay | Admitting: Family

## 2023-12-14 ENCOUNTER — Telehealth: Payer: Self-pay | Admitting: Family

## 2023-12-14 NOTE — Progress Notes (Unsigned)
 Advanced [facility] Failure Clinic Note   Referring Physician: PCP: Gwenith Shuck, NP PCP-Cardiologist: Lonni Hanson, MD   Chief Complaint:  HPI:    PCP: Gwenith Shuck, NP (at group home) Primary Cardiologist: Hanson Lonni, MD  HF provider: Rolan Barrack, MD   Chief Complaint: shortness of breath   HPI:  Mr Broski is a 33 y/o male with a history of DM, hyperlipidemia, HTN, paroxysmal atrial fibrillation, conversion disorder, sleep apnea, schizoaffective disorder, seizure, TIA, urethral stricture, former tobacco use and chronic heart failure. Patient has had a long history of paroxysmal AF.  He says this was diagnosed when he was 20 and he was told that it was triggered by excessive use of energy drinks.  He never abused drugs or ETOH per his report though he was a smoker until 5/23.  He was followed in [place] in the past. There was talk of atrial fibrillation ablation but it was never undertaken. Subsequently, he moved to a group home in [place].    Patient has had home oxygen since 2022 per his report.  High resolution CT chest in 2/24 showed no ILD, possible small airways disease.  PFTs in 2/24 showed minimal obstructive airways disease.  Sleep study showed severe OSA.  Was in the ED 01/14/23 due to increasing swelling in both of his legs along with some difficulty breathing over the past 2 days. EKG shows no evidence of arrhythmia or ischemia and troponin within normal limits, chest x-ray negative for acute process.  No significant anemia, leukocytosis, tract abnormality, or AKI noted. Urinalysis with no signs of infection. IV lasix  given. Was in the ED 01/23/23 due to shortness of breath and chest pain that started earlier today. Also complains of swelling in bilateral legs. EKG chest x-ray labs and vital signs are normal. IV lasix  provided with higher outpatient lasix  dose for a few days. Was in the ED 02/02/23 due to acute left-sided weakness. Patient reports he was awake at  that time and around 1:40 AM developed sudden nausea, pulsating in his head and a pounding headache with diffuse blurry vision throughout his visual fields. CT and angiogram are reassuring, no LVO. Subsequent MRI brain without evidence of acute pathology or acute strokes. With management of his migraine with Fioricet , Benadryl  and Compazine , his symptoms resolved and neurologically back to baseline. Normal metabolic panel and CBC. Had 2 ED visits 09/24 for atypical chest pain and cystitis. ED visit 03/06/23 due to blood in the stool and severe abdominal pain. Abdominal CT negative. Was in the ED 04/04/23 due to fall, abdominal pain, near syncope and nausea. CT concerning for possible cystitis but UA is reassuring so does not seem to be UTI. The rest of his CT scans were all reassuring.   Was in the ED 08/28/23 with chest pain along with feeling dizzy & then hot. Recently finished a course of antibiotics for urinary symptoms. CXR clear. Mildly elevated BNP at 180.Respiratory panel and UA negative. Given increase lasix  dose for 5 days and he was released.   Seen in the [facility] 04/25 where furosemide  was increased to 40mg  X 3 days due to worsening HF symptoms.   Seen twice in ED since HF visit with AMS & then chest pain.   He presents today, with caregivers, for a HF f/u visit with a chief complaint of shortness of breath. Has associated fatigue, chest heaviness, dizziness with sudden position changes, pedal edema, abdominal distention and weight gain. Not wearing bipap consistently when he feels short of breath. Is  at a new group home and staff say that they don't cook with salt and use a sodium free seasoning. Continues to wear compression socks daily.     Cardiac Test Echo 08/04/21: EF of 50% along with mild LVH Echo 10/22/21: EF of 45-50%. Echo 08/22/22: EF 55-60%.    RHC 10/07/22: Hemodynamics (mmHg) RA mean 2 RV 20/2 PA 23/8, mean 15 PCWP mean 3 Oxygen saturations: PA 74% AO 96% Cardiac Output (Fick)  9.71  Cardiac Index (Fick) 3.34   ROS: All systems negative except as listed in HPI, PMH and Problem List.  SH:  Social History   Socioeconomic History   Marital status: Single    Spouse name: Not on file   Number of children: Not on file   Years of education: Not on file   Highest education level: Not on file  Occupational History   Not on file  Tobacco Use   Smoking status: Former    Current packs/day: 0.00    Types: Cigarettes    Start date: 08/2019    Quit date: 08/2021    Years since quitting: 2.2   Smokeless tobacco: Never  Vaping Use   Vaping status: Never Used  Substance and Sexual Activity   Alcohol use: Not Currently   Drug use: Never   Sexual activity: Not Currently  Other Topics Concern   Not on file  Social History Narrative   Now living in a group home in [place].   Social Drivers of Health   Financial Resource Strain: Low Risk  (03/16/2021)   Received from [facility]   Overall Financial Resource Strain (CARDIA)    Difficulty of Paying Living Expenses: Not very hard  Food Insecurity: No Food Insecurity (09/01/2020)   Received from [facility]   Hunger Vital Sign    Within the past 12 months, you worried that your food would run out before you got the money to buy more.: Never true    Within the past 12 months, the food you bought just didn't last and you didn't have money to get more.: Never true  Transportation Needs: Not on file  Physical Activity: Not on file  Stress: No Stress Concern Present (03/16/2021)   Received from [facility] of Occupational Health - Occupational Stress Questionnaire    Feeling of Stress : Not at all  Social Connections: Unknown (09/27/2021)   Received from [facility]   Social Network    Social Network: Not on file  Intimate Partner Violence: Unknown (09/01/2021)   Received from [facility]   HITS    Physically Hurt: Not on file    Insult or Talk Down To: Not on file    Threaten Physical  Harm: Not on file    Scream or Curse: Not on file    FH:  Family History  Problem Relation Age of Onset   Colon cancer Father    Breast cancer Paternal Grandmother     Past Medical History:  Diagnosis Date   Cardiomyopathy (HCC)    a.) TTE 08/04/2021: EF 50%; b.) TTE 10/22/2021: EF 45-50-%; c.) TTE 08/22/2022: EF 55-60%   Chest pain    CHF (congestive heart failure) (HCC)    a.) TTE 08/04/2021: EF 50%, mild LVH, RVSF norm; b.) TTE 10/22/2021: EF 45-50%, glob HK, RVSF norm; c.) TTE 08/22/2022: EF 55-60%, mild dil LV, RVSF norm.   Conversion disorder    Current use of long term anticoagulation    Dyspnea  GERD (gastroesophageal reflux disease)    Hepatic steatosis    Hyperlipidemia    Hypertension    Insomnia    a.) melatonin PRN   Long term current use of anticoagulant    a.) apixaban    OSA on CPAP    Persistent atrial fibrillation and flutter (HCC)    a.) CHA2DS2VASc = 5 (CHF, HTN, TIA x 2, T2DM);  b.) rate/rhythm maintained on oral diltiazem  + metoprolol  succinate; chronically anticoagulated with apixaban    Schizoaffective disorder (HCC)    Seizure disorder (HCC)    T2DM (type 2 diabetes mellitus) (HCC)    TIA (transient ischemic attack)    Urethral stricture    a. 08/2021 s/p cystoscopy and urethral dilation.    Current Outpatient Medications  Medication Sig Dispense Refill   albuterol  (PROVENTIL ) (2.5 MG/3ML) 0.083% nebulizer solution Take 3 mLs (2.5 mg total) by nebulization every 6 (six) hours as needed for wheezing or shortness of breath. 75 mL 2   allopurinol  (ZYLOPRIM ) 300 MG tablet Take 1 tablet (300 mg total) by mouth daily. 30 tablet 2   apixaban  (ELIQUIS ) 5 MG TABS tablet Take 1 tablet (5 mg total) by mouth 2 (two) times daily. 60 tablet 0   atorvastatin  (LIPITOR ) 80 MG tablet Take 1 tablet (80 mg total) by mouth daily. 30 tablet 11   baclofen  (LIORESAL ) 10 MG tablet Take 1 tablet (10 mg total) by mouth 2 (two) times daily. 60 tablet 1   benztropine   (COGENTIN ) 1 MG tablet Take 1 tablet (1 mg total) by mouth in the morning, at noon, and at bedtime. 30 tablet 2   budesonide -formoterol  (SYMBICORT ) 160-4.5 MCG/ACT inhaler Inhale 2 puffs into the lungs 2 (two) times daily. 1 each 6   CRANBERRY EXTRACT PO Take by mouth.     cyclobenzaprine  (FLEXERIL ) 5 MG tablet Take 5 mg by mouth 3 (three) times daily as needed for muscle spasms.     diltiazem  (CARDIZEM  CD) 360 MG 24 hr capsule Take 1 capsule (360 mg total) by mouth daily. 30 capsule 11   divalproex  (DEPAKOTE ) 500 MG DR tablet Take 2 tablets (1,000 mg total) by mouth 2 (two) times daily. 120 tablet 0   escitalopram  (LEXAPRO ) 20 MG tablet Take 1 tablet (20 mg total) by mouth daily. 30 tablet 2   fluticasone  (FLONASE ) 50 MCG/ACT nasal spray Place 2 sprays into both nostrils 2 (two) times daily. 1 g 10   furosemide  (LASIX ) 20 MG tablet Take 3 tablets (60 mg total) by mouth daily. 27090 tablet 3   lithium  300 MG tablet Take 1 tablet (300 mg total) by mouth daily. 30 tablet 2   lithium  300 MG tablet Take 2 tablets (600 mg total) by mouth every evening. 30 tablet 2   loratadine  (CLARITIN ) 10 MG tablet Take 10 mg by mouth daily.     metoprolol  succinate (TOPROL  XL) 100 MG 24 hr tablet Take 1.5 tablets (150 mg total) by mouth daily. Take with or immediately following a meal. 45 tablet 0   Multiple Vitamins-Minerals (MULTIVITAMIN GUMMIES ADULT PO) Take by mouth.     mupirocin ointment (BACTROBAN) 2 % SMARTSIG:1 Application Topical 2-3 Times Daily     ondansetron  (ZOFRAN -ODT) 4 MG disintegrating tablet Take 1 tablet (4 mg total) by mouth every 8 (eight) hours as needed. 20 tablet 0   paliperidone  (INVEGA ) 9 MG 24 hr tablet Take 1 tablet (9 mg total) by mouth every morning. 30 tablet 0   potassium chloride  SA (KLOR-CON  M) 20 MEQ tablet  Take 1 tablet (20 mEq total) by mouth daily. 30 tablet 5   propranolol (INDERAL) 10 MG tablet Take 10 mg by mouth 2 (two) times daily.     sodium chloride  (OCEAN) 0.65 % SOLN  nasal spray Place 1 spray into both nostrils as needed for congestion. 10 mL 5   spironolactone  (ALDACTONE ) 25 MG tablet Take 0.5 tablets (12.5 mg total) by mouth daily. 45 tablet 3   tamsulosin  (FLOMAX ) 0.4 MG CAPS capsule Take 1 capsule (0.4 mg total) by mouth daily. 30 capsule 2   No current facility-administered medications for this visit.   There were no vitals filed for this visit.  Wt Readings from Last 3 Encounters:  12/07/23 (!) 361 lb (163.7 kg)  10/14/23 (!) 384 lb (174.2 kg)  09/20/23 (!) 355 lb 6.4 oz (161.2 kg)   Lab Results  Component Value Date   CREATININE 0.72 12/07/2023   CREATININE 0.83 10/09/2023   CREATININE 1.10 10/02/2023    PHYSICAL EXAM:  General: Well appearing. No resp difficulty HEENT: normal Neck: supple, no JVD Cor: Regular rhythm, rate. No rubs, gallops or murmurs Lungs: clear Abdomen: soft, nontender, distended. Extremities: no cyanosis, clubbing, rash, 3+ pitting edema bilateral lower legs Neuro: alert & oriented X 3. Moves all 4 extremities w/o difficulty. Affect pleasant  EKG: not done  ReDs reading: 44 %, abnormal (see plan below)   ASSESSMENT & PLAN:  1: Chronic HFpEF - suspect due to AF/ OSA - Echo 08/04/21: EF of 50% along with mild LVH - Echo 10/22/21: EF of 45-50%. - Echo 08/22/22: EF 55-60%.   - NYHA III  - fluid overload with worsening symptoms, weight gain and elevated ReDs reading - weight up 6 pounds from last visit here 3 months ago - ReDs today 44% - continue farxiga  10mg  daily - increase furosemide  to 60mg  daily/ begin potassium 20meq daily - continue metoprolol  succinate 150mg  daily - continue entresto  24/26mg  BID - continue spironolactone  12.5mg  daily - BNP 08/09/23 was 180.7 - BNP today  2: HTN - BP 118/55 - BMET 10/09/23 reviewed: sodium 135, potassium 4.2, creatinine 0.83 & GFR >60 - BMET today and repeat next week  3: Paroxysmal atrial fibrillation- - Regular on exam.  - Continue metoprolol  succinate 150mg   daily - continue diltiazem  360mg  daily - Continue eliquis  5 mg twice a day.  - saw cardiology (End) 02/25  4: Obesity- - will restart trulicity  next week; he moved group homes and lost his insurance for a period of time  5: Hyperlipidemia- - lipid panel today - continue atorvastatin  80mg  daily  6: DM- - A1c 08/05/22 was 5.8% - continue atorvastatin  80mg  daily   Return next week, sooner if needed.   Ellouise DELENA Class NP-C  4:35 PM 12/07/23      Ellouise DELENA Class, FNP 12/14/23

## 2023-12-14 NOTE — Telephone Encounter (Signed)
 Called to confirm/remind patient of their appointment at the [facility] on 12/15/23.   Appointment:   [x] Confirmed  [] Left mess   [] No answer/No voice mail  [] VM Full/unable to leave message  [] Phone not in service  Patient reminded to bring all medications and/or complete list.  Confirmed patient has transportation. Gave directions, instructed to utilize valet parking.

## 2023-12-15 ENCOUNTER — Ambulatory Visit: Payer: MEDICAID | Admitting: Family

## 2023-12-15 ENCOUNTER — Encounter: Payer: Self-pay | Admitting: Family

## 2023-12-15 ENCOUNTER — Ambulatory Visit: Payer: Self-pay | Admitting: Family

## 2023-12-15 ENCOUNTER — Other Ambulatory Visit
Admission: RE | Admit: 2023-12-15 | Discharge: 2023-12-15 | Disposition: A | Discharge status: Home / Self Care | Payer: MEDICAID | Source: Ambulatory Visit | Attending: Family | Admitting: Family

## 2023-12-15 VITALS — BP 147/57 | HR 67 | Wt 365.0 lb

## 2023-12-15 DIAGNOSIS — R569 Unspecified convulsions: Secondary | ICD-10-CM | POA: Insufficient documentation

## 2023-12-15 DIAGNOSIS — E119 Type 2 diabetes mellitus without complications: Secondary | ICD-10-CM

## 2023-12-15 DIAGNOSIS — Z7901 Long term (current) use of anticoagulants: Secondary | ICD-10-CM | POA: Diagnosis not present

## 2023-12-15 DIAGNOSIS — I110 Hypertensive heart disease with heart failure: Secondary | ICD-10-CM | POA: Insufficient documentation

## 2023-12-15 DIAGNOSIS — Z9981 Dependence on supplemental oxygen: Secondary | ICD-10-CM | POA: Diagnosis not present

## 2023-12-15 DIAGNOSIS — I10 Essential (primary) hypertension: Secondary | ICD-10-CM | POA: Diagnosis not present

## 2023-12-15 DIAGNOSIS — F259 Schizoaffective disorder, unspecified: Secondary | ICD-10-CM | POA: Diagnosis not present

## 2023-12-15 DIAGNOSIS — I5032 Chronic diastolic (congestive) heart failure: Secondary | ICD-10-CM

## 2023-12-15 DIAGNOSIS — G4733 Obstructive sleep apnea (adult) (pediatric): Secondary | ICD-10-CM | POA: Insufficient documentation

## 2023-12-15 DIAGNOSIS — E669 Obesity, unspecified: Secondary | ICD-10-CM | POA: Insufficient documentation

## 2023-12-15 DIAGNOSIS — Z79899 Other long term (current) drug therapy: Secondary | ICD-10-CM | POA: Diagnosis not present

## 2023-12-15 DIAGNOSIS — E785 Hyperlipidemia, unspecified: Secondary | ICD-10-CM | POA: Diagnosis not present

## 2023-12-15 DIAGNOSIS — I480 Paroxysmal atrial fibrillation: Secondary | ICD-10-CM | POA: Insufficient documentation

## 2023-12-15 DIAGNOSIS — Z87891 Personal history of nicotine dependence: Secondary | ICD-10-CM | POA: Diagnosis not present

## 2023-12-15 DIAGNOSIS — Z8673 Personal history of transient ischemic attack (TIA), and cerebral infarction without residual deficits: Secondary | ICD-10-CM | POA: Diagnosis not present

## 2023-12-15 DIAGNOSIS — Z7985 Long-term (current) use of injectable non-insulin antidiabetic drugs: Secondary | ICD-10-CM | POA: Diagnosis not present

## 2023-12-15 DIAGNOSIS — R0602 Shortness of breath: Secondary | ICD-10-CM | POA: Diagnosis present

## 2023-12-15 LAB — BASIC METABOLIC PANEL WITH GFR
Anion gap: 11 (ref 5–15)
BUN: 20 mg/dL (ref 6–20)
CO2: 30 mmol/L (ref 22–32)
Calcium: 9.2 mg/dL (ref 8.9–10.3)
Chloride: 95 mmol/L — ABNORMAL LOW (ref 98–111)
Creatinine, Ser: 1.26 mg/dL — ABNORMAL HIGH (ref 0.61–1.24)
GFR, Estimated: >60 mL/min (ref >60)
Glucose, Bld: 114 mg/dL — ABNORMAL HIGH (ref 70–99)
Potassium: 4.5 mmol/L (ref 3.5–5.1)
Sodium: 136 mmol/L (ref 135–145)

## 2023-12-15 MED ORDER — TRULICITY 1.5 MG/0.5ML ~~LOC~~ SOAJ: 1.5000 mg | SUBCUTANEOUS | 3 refills | Status: AC

## 2023-12-15 NOTE — Progress Notes (Signed)
 ReDS Vest / Clip - 12/15/23 1500       ReDS Vest / Clip   Station Marker D    Ruler Value 42    ReDS Value Range Low volume    ReDS Actual Value 35

## 2023-12-15 NOTE — Patient Instructions (Addendum)
 Try to keep daily fluid intake to 60-64 ounces as close to possible.   Medication Changes:  START Trulicity  1.5mg  weekly on Sundays  Lab Work:  Go over to the [facility]. Go pass the gift shop and have your blood work completed.  We will only call you if the results are abnormal or if the provider would like to make medication changes.   Follow-Up in: Please follow up with the Advanced [facility] Failure Clinic in 1 month with Ellouise Class, FNP.  At the Advanced [facility] Clinic, you and your health needs are our priority. We have a designated team specialized in the treatment of Heart Failure. This Care Team includes your primary Heart Failure Specialized Cardiologist (physician), Advanced Practice Providers (APPs- Physician Assistants and Nurse Practitioners), and Pharmacist who all work together to provide you with the care you need, when you need it.   You may see any of the following providers on your designated Care Team at your next follow up:  Dr. Toribio Fuel Dr. Ezra Shuck Dr. Ria Commander Dr. Odis Brownie Ellouise Class, FNP Jaun Bash, RPH-CPP  Please be sure to bring in all your medications bottles to every appointment.   Need to Contact Us :  If you have any questions or concerns before your next appointment please send us  a message through [place] or call our office at [phone].    TO LEAVE A MESSAGE FOR THE NURSE SELECT OPTION 2, PLEASE LEAVE A MESSAGE INCLUDING: YOUR NAME DATE OF BIRTH CALL BACK NUMBER REASON FOR CALL**this is important as we prioritize the call backs  YOU WILL RECEIVE A CALL BACK THE SAME DAY AS LONG AS YOU CALL BEFORE 4:00 PM

## 2023-12-19 NOTE — Telephone Encounter (Signed)
 Pt's caregiver aware and verbalized understanding. BMET order placed.

## 2023-12-19 NOTE — Telephone Encounter (Signed)
-----   Message from Ellouise DELENA Class sent at 12/15/2023  5:19 PM EDT ----- Kidney function a little worse since increasing furosemide . Recheck BMET in 2 weeks.  ----- Message ----- From: Interface, [facility] [place] Sent: 12/15/2023   4:52 PM EDT To: Ellouise DELENA Class, FNP

## 2023-12-27 ENCOUNTER — Other Ambulatory Visit: Payer: Self-pay | Admitting: Family

## 2024-01-02 ENCOUNTER — Ambulatory Visit (INDEPENDENT_AMBULATORY_CARE_PROVIDER_SITE_OTHER): Payer: MEDICAID | Admitting: Urology

## 2024-01-02 ENCOUNTER — Ambulatory Visit: Payer: Self-pay | Admitting: Family

## 2024-01-02 ENCOUNTER — Other Ambulatory Visit
Admission: RE | Admit: 2024-01-02 | Discharge: 2024-01-02 | Disposition: A | Discharge status: Home / Self Care | Payer: MEDICAID | Attending: Family | Admitting: Family

## 2024-01-02 VITALS — BP 96/61 | HR 82 | Ht 75.5 in | Wt 350.0 lb

## 2024-01-02 DIAGNOSIS — R300 Dysuria: Secondary | ICD-10-CM

## 2024-01-02 DIAGNOSIS — I5032 Chronic diastolic (congestive) heart failure: Secondary | ICD-10-CM | POA: Insufficient documentation

## 2024-01-02 DIAGNOSIS — Z87448 Personal history of other diseases of urinary system: Secondary | ICD-10-CM | POA: Diagnosis not present

## 2024-01-02 LAB — BASIC METABOLIC PANEL WITH GFR
Anion gap: 10 (ref 5–15)
BUN: 22 mg/dL — ABNORMAL HIGH (ref 6–20)
CO2: 26 mmol/L (ref 22–32)
Calcium: 8.6 mg/dL — ABNORMAL LOW (ref 8.9–10.3)
Chloride: 99 mmol/L (ref 98–111)
Creatinine, Ser: 0.95 mg/dL (ref 0.61–1.24)
GFR, Estimated: >60 mL/min (ref >60)
Glucose, Bld: 101 mg/dL — ABNORMAL HIGH (ref 70–99)
Potassium: 4 mmol/L (ref 3.5–5.1)
Sodium: 135 mmol/L (ref 135–145)

## 2024-01-02 NOTE — Progress Notes (Signed)
 01/02/2024 5:44 PM   Daniel [name] 27-May-1991 [id number]  Referring provider: Gwenith Shuck, NP [phone] [place],  [place] [address]  Chief Complaint  Patient presents with   Dysuria    HPI: Daniel [name] is a 33 y.o. male presents for a follow-up visit  History recurrent urethral stricture Refer to Sam Vaillancourt's note 09/30/2023.  Cystoscopy was scheduled 11/01/2023 however patient no-showed Obstructive voiding symptoms and dysuria are stable   PMH: Past Medical History:  Diagnosis Date   Cardiomyopathy (HCC)    a.) TTE 08/04/2021: EF 50%; b.) TTE 10/22/2021: EF 45-50-%; c.) TTE 08/22/2022: EF 55-60%   Chest pain    CHF (congestive heart failure) (HCC)    a.) TTE 08/04/2021: EF 50%, mild LVH, RVSF norm; b.) TTE 10/22/2021: EF 45-50%, glob HK, RVSF norm; c.) TTE 08/22/2022: EF 55-60%, mild dil LV, RVSF norm.   Conversion disorder    Current use of long term anticoagulation    Dyspnea    GERD (gastroesophageal reflux disease)    Hepatic steatosis    Hyperlipidemia    Hypertension    Insomnia    a.) melatonin PRN   Long term current use of anticoagulant    a.) apixaban    OSA on CPAP    Persistent atrial fibrillation and flutter (HCC)    a.) CHA2DS2VASc = 5 (CHF, HTN, TIA x 2, T2DM);  b.) rate/rhythm maintained on oral diltiazem  + metoprolol  succinate; chronically anticoagulated with apixaban    Schizoaffective disorder (HCC)    Seizure disorder (HCC)    T2DM (type 2 diabetes mellitus) (HCC)    TIA (transient ischemic attack)    Urethral stricture    a. 08/2021 s/p cystoscopy and urethral dilation.    Surgical History: Past Surgical History:  Procedure Laterality Date   CYSTOSCOPY WITH URETHRAL DILATATION N/A 09/07/2021   Procedure: CYSTOSCOPY WITH URETHRAL DILATATION CATHETER PLACEMENT;  Surgeon: Twylla Glendia BROCKS, MD;  Location: [facility] ORS;  Service: Urology;  Laterality: N/A;   CYSTOSCOPY WITH URETHRAL DILATATION N/A 09/06/2022   Procedure: CYSTOSCOPY WITH  URETHRAL BALLOON DILATATION USING OPTILUME;  Surgeon: Twylla Glendia BROCKS, MD;  Location: [facility] ORS;  Service: Urology;  Laterality: N/A;   RIGHT HEART CATH Right 10/07/2022   Procedure: RIGHT HEART CATH;  Surgeon: Rolan Ezra RAMAN, MD;  Location: [facility] INVASIVE CV LAB;  Service: Cardiovascular;  Laterality: Right;    Home Medications:  Allergies as of 01/02/2024       Reactions   Haldol [haloperidol] Other (See Comments)   SI   Semaglutide  Anxiety   Increase in suicidal thoughts   Dermatitis Antigen Rash   Meperidine    Meperidine   Abilify [aripiprazole] Palpitations   Demerol [meperidine Hcl] Hives   Tape Rash   Other reaction(s): Other (See Comments) Itching, skin tear        Medication List        Accurate as of January 02, 2024  5:44 PM. If you have any questions, ask your nurse or doctor.          albuterol  (2.5 MG/3ML) 0.083% nebulizer solution Commonly known as: PROVENTIL  Take 3 mLs (2.5 mg total) by nebulization every 6 (six) hours as needed for wheezing or shortness of breath.   allopurinol  300 MG tablet Commonly known as: Zyloprim  Take 1 tablet (300 mg total) by mouth daily.   apixaban  5 MG Tabs tablet Commonly known as: ELIQUIS  Take 1 tablet (5 mg total) by mouth 2 (two) times daily.   atorvastatin  80 MG tablet Commonly  known as: Lipitor  Take 1 tablet (80 mg total) by mouth daily.   baclofen  10 MG tablet Commonly known as: LIORESAL  Take 1 tablet (10 mg total) by mouth 2 (two) times daily.   benztropine  1 MG tablet Commonly known as: COGENTIN  Take 1 tablet (1 mg total) by mouth in the morning, at noon, and at bedtime.   budesonide -formoterol  160-4.5 MCG/ACT inhaler Commonly known as: SYMBICORT  Inhale 2 puffs into the lungs 2 (two) times daily.   CRANBERRY EXTRACT PO Take by mouth.   cyclobenzaprine  5 MG tablet Commonly known as: FLEXERIL  Take 5 mg by mouth 3 (three) times daily as needed for muscle spasms.   diltiazem  360 MG 24 hr  capsule Commonly known as: Cardizem  CD Take 1 capsule (360 mg total) by mouth daily.   divalproex  500 MG DR tablet Commonly known as: DEPAKOTE  Take 2 tablets (1,000 mg total) by mouth 2 (two) times daily.   escitalopram  20 MG tablet Commonly known as: Lexapro  Take 1 tablet (20 mg total) by mouth daily.   fluticasone  50 MCG/ACT nasal spray Commonly known as: FLONASE  Place 2 sprays into both nostrils 2 (two) times daily.   furosemide  20 MG tablet Commonly known as: LASIX  Take 3 tablets (60 mg total) by mouth daily.   lithium  300 MG tablet Take 1 tablet (300 mg total) by mouth daily.   lithium  300 MG tablet Take 2 tablets (600 mg total) by mouth every evening.   loratadine  10 MG tablet Commonly known as: CLARITIN  Take 10 mg by mouth daily.   metoprolol  succinate 100 MG 24 hr tablet Commonly known as: Toprol  XL Take 1.5 tablets (150 mg total) by mouth daily. Take with or immediately following a meal.   MULTIVITAMIN GUMMIES ADULT PO Take by mouth.   mupirocin ointment 2 % Commonly known as: BACTROBAN SMARTSIG:1 Application Topical 2-3 Times Daily   ondansetron  4 MG disintegrating tablet Commonly known as: ZOFRAN -ODT Take 1 tablet (4 mg total) by mouth every 8 (eight) hours as needed.   paliperidone  9 MG 24 hr tablet Commonly known as: INVEGA  Take 1 tablet (9 mg total) by mouth every morning.   potassium chloride  SA 20 MEQ tablet Commonly known as: KLOR-CON  M Take 1 tablet (20 mEq total) by mouth daily.   propranolol 10 MG tablet Commonly known as: INDERAL Take 10 mg by mouth 2 (two) times daily.   sodium chloride  0.65 % Soln nasal spray Commonly known as: OCEAN Place 1 spray into both nostrils as needed for congestion.   spironolactone  25 MG tablet Commonly known as: ALDACTONE  Take 0.5 tablets (12.5 mg total) by mouth daily.   tamsulosin  0.4 MG Caps capsule Commonly known as: FLOMAX  Take 1 capsule (0.4 mg total) by mouth daily.   Trulicity  1.5 MG/0.5ML  Soaj Generic drug: Dulaglutide  Inject 1.5 mg into the skin once a week. Every Sunday        Allergies:  Allergies  Allergen Reactions   Haldol [Haloperidol] Other (See Comments)    SI   Semaglutide  Anxiety    Increase in suicidal thoughts   Dermatitis Antigen Rash   Meperidine     Meperidine   Abilify [Aripiprazole] Palpitations   Demerol [Meperidine Hcl] Hives   Tape Rash    Other reaction(s): Other (See Comments) Itching, skin tear    Family History: Family History  Problem Relation Age of Onset   Colon cancer Father    Breast cancer Paternal Grandmother     Social History:  reports that he quit  smoking about 2 years ago. His smoking use included cigarettes. He started smoking about 4 years ago. He has never used smokeless tobacco. He reports that he does not currently use alcohol. He reports that he does not use drugs.   Physical Exam: BP 96/61   Pulse 82   Ht 6' 3.5 (1.918 m)   Wt (!) 350 lb (158.8 kg)   BMI 43.17 kg/m   Constitutional:  Alert and oriented, No acute distress. HEENT: [place] AT Cardiovascular: No clubbing, cyanosis, or edema. Respiratory: Normal respiratory effort, no increased work of breathing. Psychiatric: Normal mood and affect.   Assessment & Plan:    1.  Recurrent urethral stricture Urethral dilation for Foley catheter placement and subsequent balloon dilation Will schedule cystoscopy under sedation and if recurrent stricture identified will repeat balloon dilation   Glendia JAYSON Barba, MD  [facility] [address], [address] [place], [place] [address] [phone]

## 2024-01-07 ENCOUNTER — Encounter: Payer: Self-pay | Admitting: Urology

## 2024-01-08 ENCOUNTER — Telehealth: Payer: Self-pay

## 2024-01-08 ENCOUNTER — Other Ambulatory Visit: Payer: Self-pay

## 2024-01-08 ENCOUNTER — Other Ambulatory Visit (HOSPITAL_COMMUNITY): Payer: Self-pay

## 2024-01-08 DIAGNOSIS — N35812 Other urethral bulbous stricture, male: Secondary | ICD-10-CM

## 2024-01-08 DIAGNOSIS — R300 Dysuria: Secondary | ICD-10-CM

## 2024-01-08 NOTE — Progress Notes (Signed)
 Surgical Physician Order Form [facility] [place]  * Scheduling expectation : Next Available  *Length of Case: 30 min  *Clearance needed: yes  *Anticoagulation Instructions: Hold all anticoagulants  *Aspirin Instructions: N/A  *Post-op visit Date/Instructions:  TBD  *Diagnosis: Dysuria; history recurrent urethral stricture  *Procedure:  cystoscopy, possible balloon dilation urethral stricture   Additional orders: N/A  -Admit type: OUTpatient  -Anesthesia: MAC  -VTE Prophylaxis Standing Order SCD's       Other:   -Standing Lab Orders Per Anesthesia    Lab other: UA&Urine Culture  -Standing Test orders EKG/Chest x-ray per Anesthesia       Test other:   - Medications:  Ceftriaxone (Rocephin ) 1gm IV  -Other orders:  N/A

## 2024-01-09 ENCOUNTER — Other Ambulatory Visit (HOSPITAL_COMMUNITY): Payer: Self-pay

## 2024-01-09 ENCOUNTER — Telehealth (HOSPITAL_COMMUNITY): Payer: Self-pay | Admitting: Pharmacy Technician

## 2024-01-09 NOTE — Telephone Encounter (Signed)
 Pharmacy Patient Advocate Encounter  Insurance verification completed.   The patient is insured through [employer] test claim for Farxiga . Currently a quantity of 90 is a 90 day supply and the co-pay is $4. Ran test claim for Trulicity . Currently a quantity of 5 is a 70 day supply and the co-pay is $4. Called and spoke with Macario at [facility] [phone] to update on refill status.  This test claim was processed through [facility]- copay amounts may vary at other pharmacies due to pharmacy/plan contracts, or as the patient moves through the different stages of their insurance plan.   Almarie JULIANNA Pa, CPhT

## 2024-01-10 ENCOUNTER — Telehealth: Payer: Self-pay

## 2024-01-10 NOTE — Telephone Encounter (Signed)
   Pre-operative Risk Assessment    Patient Name: Daniel [name]  DOB: [birth year] MRN: [id number]   Date of last office visit: 12/15/23 Date of next office visit: 01/15/24   Request for Surgical Clearance    Procedure:  Diagnostic cytoscopy with possible urethral balloon dilation using optilume  Date of Surgery:  Clearance 02/08/24                                Surgeon:  Dr. Glendia Barba Surgeon's Group or Practice Name:  [facility] Phone number:  [phone] Fax number:  [phone]   Type of Clearance Requested:   - Medical  - Pharmacy:  Hold Apixaban  (Eliquis )     Type of Anesthesia:  MAC   Additional requests/questions:    Bonney Ival LOISE Gerome   01/10/2024, 3:10 PM

## 2024-01-10 NOTE — Telephone Encounter (Signed)
 Per Dr. Twylla, Patient is to be scheduled for Diagnostic Cystoscopy with Possible Urethral Balloon Dilation using Optilume   Mr. Sammye and Facility were contacted and possible surgical dates were discussed, Thursday September 11th, 2025 was agreed upon for surgery.   Patient was instructed that Dr. Twylla will require them to provide a pre-op UA & CX prior to surgery. This was ordered and scheduled drop off appointment was made for 01/30/2024.    Patient was directed to call [phone] between 1-3pm the day before surgery to find out surgical arrival time.  Instructions were given not to eat or drink from midnight on the night before surgery and have a driver for the day of surgery. On the surgery day patient was instructed to enter through the [facility] entrance of [facility] report the Same Day Surgery desk.   Pre-Admit Testing will be in contact via phone to set up an interview with the anesthesia team to review your history and medications prior to surgery.   Reminder of this information was sent via Mailed to the patient.

## 2024-01-10 NOTE — Telephone Encounter (Signed)
 Pharmacy please advise on holding Eliquis  prior to Diagnostic cystoscopy with possible urethral balloon dilation using optilume  scheduled for 02/08/2024. Last labs CBC 10/09/2023), BMET (01/02/2024). Thank you.

## 2024-01-10 NOTE — Progress Notes (Signed)
  Phone Number: [phone] for Surgical Coordinator Fax Number: [phone]  REQUEST FOR SURGICAL CLEARANCE      Date: Date: 01/10/24  [facility]ed to: [facility]  Surgeon: Dr. Glendia Barba, MD     Date of Surgery: 02/08/2024  Operation: Diagnostic Cystoscopy with Possible Urethral Balloon Dilation using Optilume   Anesthesia Type: MAC   Diagnosis: Dysuria, Recurrent Urethral Stricture  Patient Requires:   Cardiac / Vascular Clearance : Yes  Reason: Patient will need to hold Eliquis  prior to surgery   Risk Assessment:    Low   []       Moderate   []     High   []           This patient is optimized for surgery  YES []       NO   []    I recommend further assessment/workup prior to surgery. YES []      NO  []   Appointment scheduled for: _______________________   Further recommendations: ____________________________________     Physician Signature:__________________________________   Printed Name: ________________________________________   Date: _________________

## 2024-01-10 NOTE — Progress Notes (Signed)
   [facility] Surgical Posting Form  Surgery Date: Date: 02/08/2024  Surgeon: Dr. Glendia Barba, MD  Inpt ( No  )   Outpt (Yes)   Obs ( No  )   Diagnosis: R30.0 Dysuria, N35.812 Recurrent Urethral Stricture  -CPT: 52000, 47715  Surgery: Diagnostic Cystoscopy with Possible Urethral Balloon Dilation using Optilume  Stop Anticoagulations: Yes, hold All and hold Eliquis   Cardiac/Medical/Pulmonary Clearance needed: Yes  Clearance needed from Dr: [facility]  Clearance request sent on: Date: 01/10/24  *Orders entered into EPIC  Date: 01/10/24   *Case booked in EPIC  Date: 01/09/2024  *Notified pt of Surgery: Date: 01/09/2024  PRE-OP UA & CX: Yes, will obtain in clinic on 01/30/2024  *Placed into Prior Authorization Work [place] Date: 01/10/24  Assistant/laser/rep:No

## 2024-01-11 DIAGNOSIS — M545 Low back pain, unspecified: Secondary | ICD-10-CM | POA: Insufficient documentation

## 2024-01-11 NOTE — Telephone Encounter (Signed)
   Name: Daniel [name]  DOB: 06-17-1990  MRN: [id number]  Primary Cardiologist: Lonni Hanson, MD  Chart reviewed as part of pre-operative protocol coverage. Because of Daniel [name] past medical history and time since last visit, he will require a follow-up in-office visit in order to better assess preoperative cardiovascular risk.   Pre-op covering staff: - Please schedule appointment and call patient to inform them. If patient already had an upcoming appointment within acceptable timeframe, please add pre-op clearance to the appointment notes so provider is aware. - Please contact requesting surgeon's office via preferred method (i.e, phone, fax) to inform them of need for appointment prior to surgery.  Per office protocol, patient can hold Eliquis  for 2 days prior to procedure.    Damien JAYSON Braver, NP  01/11/2024, 12:17 PM

## 2024-01-11 NOTE — Telephone Encounter (Signed)
 D/w preop APP further who states will defer preop clearance Ellouise Class, PAC at our HF clinic as pt has appt 01/15/24.   Once the pt has been cleared PAC will fax their notes to the requesting office.

## 2024-01-11 NOTE — Telephone Encounter (Signed)
 I will forward to preop APP to confirm if appt 01/15/24 with HF clinic if suitable for preop clearance or does appt need to be with gen card?

## 2024-01-11 NOTE — Telephone Encounter (Signed)
 Patient with diagnosis of A Fib on Eliquis  for anticoagulation.    Procedure: Diagnostic cytoscopy with possible urethral balloon dilation using optilume  Date of procedure: 02/08/24   CHA2DS2-VASc Score = 5  This indicates a 7.2% annual risk of stroke. The patient's score is based upon: CHF History: 1 HTN History: 1 Diabetes History: 1 Stroke History: 2 Vascular Disease History: 0 Age Score: 0 Gender Score: 0    CrCl 177 ml/min using adj body weight Platelet count 229K  Patient has not had an Afib/aflutter ablation within the last 3 months or DCCV within the last 30 days  Per office protocol, patient can hold Eliquis  for 2 days prior to procedure.    **This guidance is not considered finalized until pre-operative APP has relayed final recommendations.**

## 2024-01-12 ENCOUNTER — Telehealth: Payer: Self-pay | Admitting: Family

## 2024-01-12 NOTE — Telephone Encounter (Signed)
 Called to confirm/remind patient of their appointment at the [facility] on 01/15/24.   Appointment:   [x] Confirmed  [] Left mess   [] No answer/No voice mail  [] VM Full/unable to leave message  [] Phone not in service  Patient reminded to bring all medications and/or complete list.  Confirmed patient has transportation. Gave directions, instructed to utilize valet parking.

## 2024-01-14 NOTE — Progress Notes (Unsigned)
Advanced Heart Failure Clinic Note   PCP: at group home Primary Cardiologist: Mady Bruckner, MD  HF provider: Rolan Barrack, MD   Chief Complaint: shortness of breath   HPI:  [name] is a 33 y/o male with a history of DM, hyperlipidemia, HTN, paroxysmal atrial fibrillation, conversion disorder, sleep apnea, schizoaffective disorder, seizure, TIA, urethral stricture, former tobacco use and chronic heart failure. Patient has had a long history of paroxysmal AF.  He says this was diagnosed when he was 20 and he was told that it was triggered by excessive use of energy drinks.  He never abused drugs or ETOH per his report though he was a smoker until 5/23.  He was followed in [place] in the past. There was talk of atrial fibrillation ablation but it was never undertaken. Subsequently, he moved to a group home in [place].    Patient has had home oxygen since 2022 per his report.  High resolution CT chest in 2/24 showed no ILD, possible small airways disease.  PFTs in 2/24 showed minimal obstructive airways disease.  Sleep study showed severe OSA.  Was in the ED 01/14/23 due to increasing swelling in both of his legs along with some difficulty breathing over the past 2 days. EKG shows no evidence of arrhythmia or ischemia and troponin within normal limits, chest x-ray negative for acute process.  No significant anemia, leukocytosis, tract abnormality, or AKI noted. Urinalysis with no signs of infection. IV lasix  given. Was in the ED 01/23/23 due to shortness of breath and chest pain that started earlier today. Also complains of swelling in bilateral legs. EKG chest x-ray labs and vital signs are normal. IV lasix  provided with higher outpatient lasix  dose for a few days. Was in the ED 02/02/23 due to acute left-sided weakness. Patient reports he was awake at that time and around 1:40 AM developed sudden nausea, pulsating in his head and a pounding headache with diffuse blurry vision throughout  his visual fields. CT and angiogram are reassuring, no LVO. Subsequent MRI brain without evidence of acute pathology or acute strokes. With management of his migraine with Fioricet , Benadryl  and Compazine , his symptoms resolved and neurologically back to baseline. Normal metabolic panel and CBC. Had 2 ED visits 09/24 for atypical chest pain and cystitis. ED visit 03/06/23 due to blood in the stool and severe abdominal pain. Abdominal CT negative. Was in the ED 04/04/23 due to fall, abdominal pain, near syncope and nausea. CT concerning for possible cystitis but UA is reassuring so does not seem to be UTI. The rest of his CT scans were all reassuring.   Was in the ED 08/28/23 with chest pain along with feeling dizzy & then hot. Recently finished a course of antibiotics for urinary symptoms. CXR clear. Mildly elevated BNP at 180.Respiratory panel and UA negative. Given increase lasix  dose for 5 days and he was released.   Seen in the [facility] 04/25 where furosemide  was increased to 40mg  X 3 days due to worsening HF symptoms.   Seen twice in ED since HF 04/25 visit with AMS & then chest pain.   Seen in the [facility] 12/07/23 where furosemide  was increased to 60mg  daily and potassium 20meq was started.   Seen in [facility] 12/15/23 where trulicity  1.5mg  weekly was resumed.   He presents today, with caregiver, for a HF f/u visit with a chief complaint of shortness of breath. Has associated fatigue, occasional chest pain/ palpitations, leg cramps, dizziness with standing. Hasn't been wearing bipap as it  needs serviced. Hasn't started trulicity  yet but has increased exercise and is now eating less and healthier and has lost substantial weight.   Currently scheduled for diagnostic cystoscopy with possible urethral balloon dilation on 02/08/24 and needs a cardiac risk assessment today.     Cardiac Test Echo 08/04/21: EF of 50% along with mild LVH Echo 10/22/21: EF of 45-50%. Echo 08/22/22: EF 55-60%.    RHC 10/07/22: Hemodynamics  (mmHg) RA mean 2 RV 20/2 PA 23/8, mean 15 PCWP mean 3 Oxygen saturations: PA 74% AO 96% Cardiac Output (Fick) 9.71  Cardiac Index (Fick) 3.34   ROS: All systems negative except as listed in HPI, PMH and Problem List.  SH:  Social History   Socioeconomic History   Marital status: Single    Spouse name: Not on file   Number of children: Not on file   Years of education: Not on file   Highest education level: Not on file  Occupational History   Not on file  Tobacco Use   Smoking status: Former    Current packs/day: 0.00    Types: Cigarettes    Start date: 08/2019    Quit date: 08/2021    Years since quitting: 2.3   Smokeless tobacco: Never  Vaping Use   Vaping status: Never Used  Substance and Sexual Activity   Alcohol use: Not Currently   Drug use: Never   Sexual activity: Not Currently  Other Topics Concern   Not on file  Social History Narrative   Now living in a group home in [place].   Social Drivers of Health   Financial Resource Strain: Low Risk  (03/16/2021)   Received from [facility]   Overall Financial Resource Strain (CARDIA)    Difficulty of Paying Living Expenses: Not very hard  Food Insecurity: No Food Insecurity (09/01/2020)   Received from [facility]   Hunger Vital Sign    Within the past 12 months, you worried that your food would run out before you got the money to buy more.: Never true    Within the past 12 months, the food you bought just didn't last and you didn't have money to get more.: Never true  Transportation Needs: Not on file  Physical Activity: Not on file  Stress: No Stress Concern Present (03/16/2021)   Received from [facility] - Occupational Stress Questionnaire    Feeling of Stress : Not at all  Social Connections: Unknown (09/27/2021)   Received from [facility]   Social Network    Social Network: Not on file  Intimate Partner Violence: Unknown (09/01/2021)   Received from  [facility]   HITS    Physically Hurt: Not on file    Insult or Talk Down To: Not on file    Threaten Physical Harm: Not on file    Scream or Curse: Not on file    FH:  Family History  Problem Relation Age of Onset   Colon cancer Father    Breast cancer Paternal Grandmother     Past Medical History:  Diagnosis Date   Cardiomyopathy (HCC)    a.) TTE 08/04/2021: EF 50%; b.) TTE 10/22/2021: EF 45-50-%; c.) TTE 08/22/2022: EF 55-60%   Chest pain    CHF (congestive heart failure) (HCC)    a.) TTE 08/04/2021: EF 50%, mild LVH, RVSF norm; b.) TTE 10/22/2021: EF 45-50%, glob HK, RVSF norm; c.) TTE 08/22/2022: EF 55-60%, mild dil LV, RVSF norm.  Conversion disorder    Current use of long term anticoagulation    Dyspnea    GERD (gastroesophageal reflux disease)    Hepatic steatosis    Hyperlipidemia    Hypertension    Insomnia    a.) melatonin PRN   Long term current use of anticoagulant    a.) apixaban    OSA on CPAP    Persistent atrial fibrillation and flutter (HCC)    a.) CHA2DS2VASc = 5 (CHF, HTN, TIA x 2, T2DM);  b.) rate/rhythm maintained on oral diltiazem  + metoprolol  succinate; chronically anticoagulated with apixaban    Schizoaffective disorder (HCC)    Seizure disorder (HCC)    T2DM (type 2 diabetes mellitus) (HCC)    TIA (transient ischemic attack)    Urethral stricture    a. 08/2021 s/p cystoscopy and urethral dilation.    Current Outpatient Medications  Medication Sig Dispense Refill   albuterol  (PROVENTIL ) (2.5 MG/3ML) 0.083% nebulizer solution Take 3 mLs (2.5 mg total) by nebulization every 6 (six) hours as needed for wheezing or shortness of breath. 75 mL 2   allopurinol  (ZYLOPRIM ) 300 MG tablet Take 1 tablet (300 mg total) by mouth daily. 30 tablet 2   apixaban  (ELIQUIS ) 5 MG TABS tablet Take 1 tablet (5 mg total) by mouth 2 (two) times daily. 60 tablet 0   atorvastatin  (LIPITOR ) 80 MG tablet Take 1 tablet (80 mg total) by mouth daily. 30 tablet 11   baclofen   (LIORESAL ) 10 MG tablet Take 1 tablet (10 mg total) by mouth 2 (two) times daily. 60 tablet 1   benztropine  (COGENTIN ) 1 MG tablet Take 1 tablet (1 mg total) by mouth in the morning, at noon, and at bedtime. 30 tablet 2   budesonide -formoterol  (SYMBICORT ) 160-4.5 MCG/ACT inhaler Inhale 2 puffs into the lungs 2 (two) times daily. 1 each 6   CRANBERRY EXTRACT PO Take by mouth.     cyclobenzaprine  (FLEXERIL ) 5 MG tablet Take 5 mg by mouth 3 (three) times daily as needed for muscle spasms.     diltiazem  (CARDIZEM  CD) 360 MG 24 hr capsule Take 1 capsule (360 mg total) by mouth daily. 30 capsule 11   divalproex  (DEPAKOTE ) 500 MG DR tablet Take 2 tablets (1,000 mg total) by mouth 2 (two) times daily. 120 tablet 0   Dulaglutide  (TRULICITY ) 1.5 MG/0.5ML SOAJ Inject 1.5 mg into the skin once a week. Every Sunday 5 mL 3   escitalopram  (LEXAPRO ) 20 MG tablet Take 1 tablet (20 mg total) by mouth daily. 30 tablet 2   fluticasone  (FLONASE ) 50 MCG/ACT nasal spray Place 2 sprays into both nostrils 2 (two) times daily. 1 g 10   furosemide  (LASIX ) 20 MG tablet Take 3 tablets (60 mg total) by mouth daily. 27090 tablet 3   lithium  300 MG tablet Take 1 tablet (300 mg total) by mouth daily. 30 tablet 2   lithium  300 MG tablet Take 2 tablets (600 mg total) by mouth every evening. 30 tablet 2   loratadine  (CLARITIN ) 10 MG tablet Take 10 mg by mouth daily.     metoprolol  succinate (TOPROL  XL) 100 MG 24 hr tablet Take 1.5 tablets (150 mg total) by mouth daily. Take with or immediately following a meal. 45 tablet 0   Multiple Vitamins-Minerals (MULTIVITAMIN GUMMIES ADULT PO) Take by mouth.     mupirocin ointment (BACTROBAN) 2 % SMARTSIG:1 Application Topical 2-3 Times Daily     ondansetron  (ZOFRAN -ODT) 4 MG disintegrating tablet Take 1 tablet (4 mg total) by mouth every 8 (  eight) hours as needed. 20 tablet 0   paliperidone  (INVEGA ) 9 MG 24 hr tablet Take 1 tablet (9 mg total) by mouth every morning. 30 tablet 0   potassium  chloride SA (KLOR-CON  M) 20 MEQ tablet Take 1 tablet (20 mEq total) by mouth daily. 30 tablet 5   propranolol (INDERAL) 10 MG tablet Take 10 mg by mouth 2 (two) times daily.     sodium chloride  (OCEAN) 0.65 % SOLN nasal spray Place 1 spray into both nostrils as needed for congestion. 10 mL 5   spironolactone  (ALDACTONE ) 25 MG tablet Take 0.5 tablets (12.5 mg total) by mouth daily. 45 tablet 3   tamsulosin  (FLOMAX ) 0.4 MG CAPS capsule Take 1 capsule (0.4 mg total) by mouth daily. 30 capsule 2   No current facility-administered medications for this visit.   Vitals:   01/15/24 1208  BP: 130/68  Pulse: 65  SpO2: 98%  Weight: (!) 346 lb (156.9 kg)   Wt Readings from Last 3 Encounters:  01/15/24 (!) 346 lb (156.9 kg)  01/02/24 (!) 350 lb (158.8 kg)  12/15/23 (!) 365 lb (165.6 kg)   Lab Results  Component Value Date   CREATININE 1.19 01/15/2024   CREATININE 0.95 01/02/2024   CREATININE 1.26 (H) 12/15/2023    PHYSICAL EXAM:  General: Well appearing.  Cor: No JVD. Regular rhythm, rate.  Lungs: clear Abdomen: soft, nontender, nondistended. Extremities: trace edema bilateral lower legs Neuro:. Affect pleasant   EKG: NSR with prolonged QTc of 458 (within his range over last several months), personally reviewed   ASSESSMENT & PLAN:  1: Chronic HFpEF - suspect due to AF/ OSA - Echo 08/04/21: EF of 50% along with mild LVH - Echo 10/22/21: EF of 45-50%. - Echo 08/22/22: EF 55-60%.   - NYHA II - euvolemic - weight down 19 pounds from last visit here 1 month ago with exercising and eating healthier and less quantity (hasn't started trulicity ) - continue farxiga  10mg  daily - continue furosemide  60mg  daily/ potassium 20meq daily - continue metoprolol  succinate 150mg  daily - continue entresto  24/26mg  BID - continue spironolactone  12.5mg  daily - reviewed fluid intake and the importance of keeping fluid intake to 60-64 ounces - BNP 12/07/23 was 139.6 - Current RCRI risk assessment is  1.1% of major cardiac event. He would be considered a major risk, however, due to his history of HF, PAF, HTN, OSA and his obesity. Will defer to general cardiology regarding stoppage of eliquis .   2: HTN - BP 130/ - BMET 01/02/24 reviewed: sodium 135, potassium 4.0, creatinine 0.95 & GFR >60  3: Paroxysmal atrial fibrillation- - Regular on exam.  - EKG today is NSR with prolonged QTc although this is within his range over the last several months - Continue metoprolol  succinate 150mg  daily - continue diltiazem  360mg  daily - Continue eliquis  5 mg twice a day.  - saw cardiology (End) 02/25  4: Obesity- - has lost 19 pounds in the last month without starting trulicity  - encouraged him to continue his exercise and continue eating less. Can start trulicity  in the future if needed.   5: Hyperlipidemia- - LDL 12/07/23 was 29; triglycerides were 301. Rechecks lipids ~ 10/25 - continue atorvastatin  80mg  daily  6: DM- - A1c 08/05/22 was 5.8%  7: OSA- - has bipap but hasn't been wearing it as he says that it needs serviced. Group home to coordinate getting it serviced.    Return in 1 month, sooner if needed.   Daniel [name] Class, FNP  01/14/24  

## 2024-01-15 ENCOUNTER — Ambulatory Visit: Payer: Self-pay | Admitting: Family

## 2024-01-15 ENCOUNTER — Other Ambulatory Visit
Admission: RE | Admit: 2024-01-15 | Discharge: 2024-01-15 | Disposition: A | Discharge status: Home / Self Care | Payer: MEDICAID | Source: Ambulatory Visit | Attending: Family | Admitting: Family

## 2024-01-15 ENCOUNTER — Other Ambulatory Visit (HOSPITAL_COMMUNITY): Payer: Self-pay

## 2024-01-15 ENCOUNTER — Ambulatory Visit (HOSPITAL_BASED_OUTPATIENT_CLINIC_OR_DEPARTMENT_OTHER): Payer: MEDICAID | Admitting: Family

## 2024-01-15 VITALS — BP 130/68 | HR 65 | Wt 346.0 lb

## 2024-01-15 DIAGNOSIS — I480 Paroxysmal atrial fibrillation: Secondary | ICD-10-CM

## 2024-01-15 DIAGNOSIS — I10 Essential (primary) hypertension: Secondary | ICD-10-CM | POA: Diagnosis not present

## 2024-01-15 DIAGNOSIS — E119 Type 2 diabetes mellitus without complications: Secondary | ICD-10-CM

## 2024-01-15 DIAGNOSIS — G4733 Obstructive sleep apnea (adult) (pediatric): Secondary | ICD-10-CM

## 2024-01-15 DIAGNOSIS — I5032 Chronic diastolic (congestive) heart failure: Secondary | ICD-10-CM

## 2024-01-15 DIAGNOSIS — E785 Hyperlipidemia, unspecified: Secondary | ICD-10-CM

## 2024-01-15 DIAGNOSIS — R0602 Shortness of breath: Secondary | ICD-10-CM | POA: Diagnosis not present

## 2024-01-15 DIAGNOSIS — R079 Chest pain, unspecified: Secondary | ICD-10-CM | POA: Diagnosis not present

## 2024-01-15 LAB — BASIC METABOLIC PANEL WITH GFR
Anion gap: 10 (ref 5–15)
BUN: 21 mg/dL — ABNORMAL HIGH (ref 6–20)
CO2: 29 mmol/L (ref 22–32)
Calcium: 9.2 mg/dL (ref 8.9–10.3)
Chloride: 97 mmol/L — ABNORMAL LOW (ref 98–111)
Creatinine, Ser: 1.19 mg/dL (ref 0.61–1.24)
GFR, Estimated: >60 mL/min (ref >60)
Glucose, Bld: 91 mg/dL (ref 70–99)
Potassium: 4.4 mmol/L (ref 3.5–5.1)
Sodium: 136 mmol/L (ref 135–145)

## 2024-01-15 LAB — MAGNESIUM: Magnesium: 2.2 mg/dL (ref 1.7–2.4)

## 2024-01-15 NOTE — Telephone Encounter (Addendum)
 Prior authorization for Trulicity  has been submitted and approved. Test billing returns $4 for 84 day supply.  Key: [id number] Effective: 01/08/2024 to 01/08/2025  Rachel DEL, CPhT Rx Patient Advocate Phone: [phone]

## 2024-01-15 NOTE — Patient Instructions (Signed)
 Medication Changes:  No medication changes.   Lab Work:  Go over to the MEDICAL MALL. Go pass the gift shop and have your blood work completed.  We will only call you if the results are abnormal or if the provider would like to make medication changes.  No news is good news.   Follow-Up in: 1 month with Dr. Rolan.   Thank you for choosing [facility].    At the [facility], you and your health needs are our priority. We have a designated team specialized in the treatment of Heart Failure. This Care Team includes your primary Heart Failure Specialized Cardiologist (physician), Advanced Practice Providers (APPs- Physician Assistants and Nurse Practitioners), and Pharmacist who all work together to provide you with the care you need, when you need it.   You may see any of the following providers on your designated Care Team at your next follow up:  Dr. Toribio Fuel Dr. Ezra Rolan Dr. Ria Commander Dr. Morene Brownie Ellouise Class, FNP Jaun Bash, RPH-CPP  Please be sure to bring in all your medications bottles to every appointment.   Need to Contact Us :  If you have any questions or concerns before your next appointment please send us  a message through [place] or call our office at [phone].    TO LEAVE A MESSAGE FOR THE NURSE SELECT OPTION 2, PLEASE LEAVE A MESSAGE INCLUDING: YOUR NAME DATE OF BIRTH CALL BACK NUMBER REASON FOR CALL**this is important as we prioritize the call backs  YOU WILL RECEIVE A CALL BACK THE SAME DAY AS LONG AS YOU CALL BEFORE 4:00 PM

## 2024-01-15 NOTE — Telephone Encounter (Signed)
   Patient Name: Daniel [name]  DOB: 06-21-1990 MRN: [id number]  Primary Cardiologist: Lonni Hanson, MD  Chart reviewed as part of pre-operative protocol coverage.   Per Ellouise Class on 01/15/2024: Symptoms stable, weight down 19 pounds in the last month. Has been unable to wear bipap due to it needing serviced.  Current RCRI risk assessment is 1.1% of major cardiac event. He would be considered a major risk   Patient has not had an Afib/aflutter ablation within the last 3 months or DCCV within the last 30 days   Per office protocol, patient can hold Eliquis  for 2 days prior to procedure.    Lum LITTIE Louis, NP 01/15/2024, 4:06 PM

## 2024-01-15 NOTE — Telephone Encounter (Signed)
 I will forward back to preop APP to see notes from [facility], [facility] about blood thinner.

## 2024-01-16 ENCOUNTER — Telehealth: Payer: Self-pay

## 2024-01-16 NOTE — Telephone Encounter (Signed)
 Received clearance from [facility]- Patient may hold Eliquis  for 2 days prior to surgery. Patients last dosage is on September 8th, 2025. Information communicated with Denise-Patient's caregiver, verbalized understanding.

## 2024-01-23 ENCOUNTER — Other Ambulatory Visit: Payer: Self-pay | Admitting: Family

## 2024-01-25 ENCOUNTER — Encounter: Payer: Self-pay | Admitting: Dietician

## 2024-01-25 NOTE — Progress Notes (Signed)
 Patient has not rescheduled his missed appointment from 10/19/23. Sent notification to referring provider.

## 2024-01-30 ENCOUNTER — Telehealth: Payer: Self-pay

## 2024-01-30 ENCOUNTER — Telehealth: Payer: Self-pay | Admitting: Family

## 2024-01-30 ENCOUNTER — Other Ambulatory Visit: Payer: MEDICAID

## 2024-01-30 ENCOUNTER — Other Ambulatory Visit (HOSPITAL_COMMUNITY): Payer: Self-pay

## 2024-01-30 DIAGNOSIS — N35812 Other urethral bulbous stricture, male: Secondary | ICD-10-CM

## 2024-01-30 DIAGNOSIS — R300 Dysuria: Secondary | ICD-10-CM

## 2024-01-30 LAB — URINALYSIS, COMPLETE
Bilirubin, UA: NEGATIVE
Ketones, UA: NEGATIVE
Nitrite, UA: NEGATIVE
Protein,UA: NEGATIVE
RBC, UA: NEGATIVE
Specific Gravity, UA: 1.01 (ref 1.005–1.030)
Urobilinogen, Ur: 1 mg/dL (ref 0.2–1.0)
pH, UA: 7 (ref 5.0–7.5)

## 2024-01-30 LAB — MICROSCOPIC EXAMINATION
Epithelial Cells (non renal): >10 /HPF — AB (ref 0–10)
WBC, UA: >30 /HPF — AB (ref 0–5)

## 2024-01-31 ENCOUNTER — Other Ambulatory Visit (HOSPITAL_COMMUNITY): Payer: Self-pay

## 2024-01-31 NOTE — Telephone Encounter (Signed)
 Prior auth approved in separate encounter

## 2024-01-31 NOTE — Telephone Encounter (Signed)
 Advanced Heart Failure Patient Advocate Encounter  Prior authorization for Farxiga  has been submitted and approved. Test billing returns $4 for 90 day supply.  KeyBETHA PARROT Effective: 01/31/2024 to 01/30/2025  Rachel DEL, CPhT Rx Patient Advocate Phone: [phone]

## 2024-02-01 ENCOUNTER — Encounter
Admission: RE | Admit: 2024-02-01 | Discharge: 2024-02-01 | Disposition: A | Discharge status: Home / Self Care | Payer: MEDICAID | Source: Ambulatory Visit | Attending: Urology | Admitting: Urology

## 2024-02-01 ENCOUNTER — Other Ambulatory Visit: Payer: Self-pay

## 2024-02-01 VITALS — Ht 75.0 in | Wt 340.0 lb

## 2024-02-01 DIAGNOSIS — E875 Hyperkalemia: Secondary | ICD-10-CM

## 2024-02-01 DIAGNOSIS — E119 Type 2 diabetes mellitus without complications: Secondary | ICD-10-CM

## 2024-02-01 NOTE — Patient Instructions (Signed)
 Your procedure is scheduled on: Thursday 02/08/24 Report to the Registration Desk on the 1st floor of the [facility]. To find out your arrival time, please call [phone] between 1PM - 3PM on: Wednesday 02/07/24 If your arrival time is 6:00 am, do not arrive before that time as the [facility] entrance doors do not open until 6:00 am.  REMEMBER: Instructions that are not followed completely may result in serious medical risk, up to and including death; or upon the discretion of your surgeon and anesthesiologist your surgery may need to be rescheduled.  Do not eat food or drink any liquids after midnight the night before surgery.  No gum chewing or hard candies.  One week prior to surgery: Stop Anti-inflammatories (NSAIDS) such as Advil , Aleve , Ibuprofen , Motrin , Naproxen , Naprosyn  and Aspirin based products such as Excedrin, Goody's Powder, BC Powder. Stop ANY OVER THE COUNTER supplements until after surgery.  You may however, continue to take Tylenol  if needed for pain up until the day of surgery.  **Follow guidelines for insulin  and diabetes medications.**  **Follow recommendations regarding stopping blood thinners.**  Continue taking all of your other prescription medications up until the day of surgery.  ON THE DAY OF SURGERY ONLY TAKE THESE MEDICATIONS WITH SIPS OF WATER :    Use inhalers on the day of surgery and bring to the hospital.  Fleets enema or bowel prep as directed.  No Alcohol for 24 hours before or after surgery.  No Smoking including e-cigarettes for 24 hours before surgery.  No chewable tobacco products for at least 6 hours before surgery.  No nicotine patches on the day of surgery.  Do not use any recreational drugs for at least a week (preferably 2 weeks) before your surgery.  Please be advised that the combination of cocaine and anesthesia may have negative outcomes, up to and including death. If you test positive for cocaine, your surgery will be  cancelled.  On the morning of surgery brush your teeth with toothpaste and water , you may rinse your mouth with mouthwash if you wish. Do not swallow any toothpaste or mouthwash.  Use CHG Soap or wipes as directed on instruction sheet.  Do not wear jewelry, make-up, hairpins, clips or nail polish.  For welded (permanent) jewelry: bracelets, anklets, waist bands, etc.  Please have this removed prior to surgery.  If it is not removed, there is a chance that hospital personnel will need to cut it off on the day of surgery.  Do not wear lotions, powders, or perfumes.   Do not shave body hair from the neck down 48 hours before surgery.  Contact lenses, hearing aids and dentures may not be worn into surgery.  Do not bring valuables to the hospital. [facility] is not responsible for any missing/lost belongings or valuables.   Total Shoulder Arthroplasty:  use Benzoyl Peroxide 5% Gel as directed on instruction sheet.  Bring your C-PAP to the hospital in case you may have to spend the night.   Notify your doctor if there is any change in your medical condition (cold, fever, infection).  Wear comfortable clothing (specific to your surgery type) to the hospital.  After surgery, you can help prevent lung complications by doing breathing exercises.  Take deep breaths and cough every 1-2 hours. Your doctor may order a device called an Incentive Spirometer to help you take deep breaths. When coughing or sneezing, hold a pillow firmly against your incision with both hands. This is called "splinting." Doing this  helps protect your incision. It also decreases belly discomfort.  If you are being admitted to the hospital overnight, leave your suitcase in the car. After surgery it may be brought to your room.  In case of increased patient census, it may be necessary for you, the patient, to continue your postoperative care in the Same Day Surgery department.  If you are being discharged the day of  surgery, you will not be allowed to drive home. You will need a responsible individual to drive you home and stay with you for 24 hours after surgery.   If you are taking public transportation, you will need to have a responsible individual with you.  Please call the Pre-admissions Testing Dept. at [phone] if you have any questions about these instructions.  Surgery Visitation Policy:  Patients having surgery or a procedure may have two visitors.  Children under the age of 71 must have an adult with them who is not the patient.  Inpatient Visitation:    Visiting hours are 7 a.m. to 8 p.m. Up to four visitors are allowed at one time in a patient room. The visitors may rotate out with other people during the day.  One visitor age 20 or older may stay with the patient overnight and must be in the room by 8 p.m.   Merchandiser, retail to address health-related social needs:  [email or web address]

## 2024-02-01 NOTE — Patient Instructions (Addendum)
 Your procedure is scheduled on: Thursday 02/08/24 Report to the Registration Desk on the 1st floor of the [facility]. To find out your arrival time, please call [phone] between 1PM - 3PM on: Wednesday 02/07/24 If your arrival time is 6:00 am, do not arrive before that time as the [facility] entrance doors do not open until 6:00 am.  REMEMBER: Instructions that are not followed completely may result in serious medical risk, up to and including death; or upon the discretion of your surgeon and anesthesiologist your surgery may need to be rescheduled.  Do not eat food or drink any liquids after midnight the night before surgery.  No gum chewing or hard candies.  One week prior to surgery: Stop Anti-inflammatories (NSAIDS) such as Advil , Aleve , Ibuprofen , Motrin , Naproxen , Naprosyn  and Aspirin based products such as Excedrin, Goody's Powder, BC Powder.  You may however, continue to take Tylenol  if needed for pain up until the day of surgery.   Stop ANY OVER THE COUNTER supplements and vitamins until after surgery.    **Follow guidelines for insulin  and diabetes medications.** Hold Farxiga  for 3 days. Last dose Sunday 02/04/24 also need to skip Trulicity (delaglutide) injection on Sunday 02/04/24.  **Follow recommendations regarding stopping blood thinners.** Last dose of Eliquis  is Monday night 02/05/24  Continue taking all of your other prescription medications up until the day of surgery.  ON THE DAY OF SURGERY ONLY TAKE THESE MEDICATIONS WITH SIPS OF WATER :  atorvastatin  (LIPITOR ) 80 MG tablet  benztropine  (COGENTIN ) 1 MG tablet  diltiazem  (CARDIZEM  CD) 360 MG 24 hr capsule  divalproex  (DEPAKOTE ) 1000 MG DR tablet  escitalopram  (LEXAPRO ) 20 MG tablet  hydrOXYzine  (VISTARIL ) 50 MG capsule  lithium  300 MG tablet  loratadine  (CLARITIN ) 10 MG tablet  metoprolol  succinate (TOPROL  XL) 150 MG 24 hr tablet  paliperidone  (INVEGA ) 9 MG 24 hr tablet  propranolol (INDERAL) 10 MG tablet    Use inhalers on the day of surgery and bring to the hospital. Try to get in a nebulizer treatment.  No Alcohol for 24 hours before or after surgery.  No Smoking including e-cigarettes for 24 hours before surgery.  No chewable tobacco products for at least 6 hours before surgery.  No nicotine patches on the day of surgery.  Do not use any recreational drugs for at least a week (preferably 2 weeks) before your surgery.  Please be advised that the combination of cocaine and anesthesia may have negative outcomes, up to and including death. If you test positive for cocaine, your surgery will be cancelled.  On the morning of surgery brush your teeth with toothpaste and water , you may rinse your mouth with mouthwash if you wish. Do not swallow any toothpaste or mouthwash.  Shower prior to arriving for surgery.  Do not wear lotions, powders, or perfumes.  Do not shave body hair from the neck down 48 hours before surgery.  Wear comfortable clothing (specific to your surgery type) to the hospital.  Do not wear jewelry, make-up, hairpins, clips or nail polish.     For welded (permanent) jewelry: bracelets, anklets, waist bands, etc.  Please have this removed prior to surgery.  If it is not removed, there is a chance that hospital personnel will need to cut it off on the day of surgery.       Contact lenses, hearing aids and dentures may not be worn into surgery.  Do not bring valuables to the hospital. [facility] is not responsible for any missing/lost belongings or  valuables.   Total Shoulder Arthroplasty:  use Benzoyl Peroxide 5% Gel as directed on instruction sheet.  Bring your C-PAP to the hospital in case you may have to spend the night.   Notify your doctor if there is any change in your medical condition (cold, fever, infection).    After surgery, you can help prevent lung complications by doing breathing exercises.  Take deep breaths and cough every 1-2 hours. Your  doctor may order a device called an Incentive Spirometer to help you take deep breaths. When coughing or sneezing, hold a pillow firmly against your incision with both hands. This is called "splinting." Doing this helps protect your incision. It also decreases belly discomfort.  If you are being admitted to the hospital overnight, leave your suitcase in the car. After surgery it may be brought to your room.  In case of increased patient census, it may be necessary for you, the patient, to continue your postoperative care in the Same Day Surgery department.  If you are being discharged the day of surgery, you will not be allowed to drive home. You will need a responsible individual to drive you home and stay with you for 24 hours after surgery.   If you are taking public transportation, you will need to have a responsible individual with you.  Please call the Pre-admissions Testing Dept. at [phone] if you have any questions about these instructions.  Surgery Visitation Policy:  Patients having surgery or a procedure may have two visitors.  Children under the age of 56 must have an adult with them who is not the patient.  Inpatient Visitation:    Visiting hours are 7 a.m. to 8 p.m. Up to four visitors are allowed at one time in a patient room. The visitors may rotate out with other people during the day.  One visitor age 46 or older may stay with the patient overnight and must be in the room by 8 p.m.   Merchandiser, retail to address health-related social needs:  [email or web address]

## 2024-02-02 NOTE — Pre-Procedure Instructions (Signed)
 Have not received requested information from patients guardian or caregiver . Called guardian she states she is currently out of the office to be back on Monday 02/04/24 will check for fax and send requested documents (signed consent and guardianship ppw). Will f/u Monday if not received.  Telephone msg to caregiver. No answer today left VM. Will continue to f/u.

## 2024-02-04 ENCOUNTER — Ambulatory Visit: Payer: Self-pay | Admitting: Urology

## 2024-02-04 LAB — CULTURE, URINE COMPREHENSIVE

## 2024-02-05 ENCOUNTER — Other Ambulatory Visit: Payer: Self-pay

## 2024-02-05 MED ORDER — AMOXICILLIN 875 MG PO TABS
875.0000 mg | ORAL_TABLET | Freq: Two times a day (BID) | ORAL | 0 refills | Status: AC
  Filled 2024-02-05: qty 14, 7d supply, fill #0

## 2024-02-06 ENCOUNTER — Other Ambulatory Visit (HOSPITAL_COMMUNITY): Payer: Self-pay

## 2024-02-06 ENCOUNTER — Encounter: Payer: Self-pay | Admitting: Urgent Care

## 2024-02-06 ENCOUNTER — Encounter: Payer: Self-pay | Admitting: Urology

## 2024-02-06 NOTE — Progress Notes (Signed)
 Perioperative / Anesthesia Services  Pre-Admission Testing Clinical Review / Pre-Operative Anesthesia Consult  Date: 02/06/24  PATIENT DEMOGRAPHICS: Name: Daniel [name] DOB: February 23, 1991 MRN:   [id number]  Note: Available PAT nursing documentation and vital signs have been reviewed. Clinical nursing staff has updated patient's PMH/PSHx, current medication list, and drug allergies/intolerances to ensure complete and comprehensive history available to assist care teams in MDM as it pertains to the aforementioned surgical procedure and anticipated anesthetic course. Extensive review of available clinical information personally performed. [place] PMH and PSHx updated with any diagnoses/procedures that  may have been inadvertently omitted during his intake with the pre-admission testing department's nursing staff.  PLANNED SURGICAL PROCEDURE(S):   Case: [id number] Date/Time: 02/08/24 0715   Procedures:      CYSTOSCOPY     CYSTOURETHROSCOPY, WITH URETHRAL STRICTURE DILATION USING DRUG-COATED BALLOON   Anesthesia type: Monitor Anesthesia Care   Diagnosis:      Dysuria [R30.0]     Other stricture of urethra in male [N35.819]   Pre-op diagnosis: Dysuria, Urethral Stricture   Location: [facility] OR ROOM 10 / [facility] ORS FOR ANESTHESIA GROUP   Surgeons: Twylla Glendia BROCKS, MD        CLINICAL DISCUSSION: Daniel [name] is a 33 y.o. male who is submitted for pre-surgical anesthesia review and clearance prior to him undergoing the above procedure. Patient is a Former Smoker (quit 08/2021). Pertinent PMH includes: atrial fibrillation/flutter, cardiomyopathy, HFpEF, TIA, angina, HTN, HLD, T2DM, OSAH (requires nocturnal PAP therapy), dyspnea, GERD (on daily PPI), urethral stricture, seizure disorder, conversion disorder, schizoaffective disorder, insomnia.   Patient is followed by cardiology (End, MD). He was last seen in the cardiology clinic on 07/05/2023; notes reviewed. At the time of his clinic visit,  patient doing well overall from a cardiovascular perspective.  He complained of rare chest wall pain following weight lifting, minimal leg edema, and rare episodes of palpitations. Patient denied any chest pain, shortness of breath, PND, orthopnea, weakness, fatigue, vertiginous symptoms, or presyncope/syncope. Patient with a past medical history significant for cardiovascular diagnoses. Documented physical exam was grossly benign, providing no evidence of acute exacerbation and/or decompensation of the patient's known cardiovascular conditions.  Patient reported to have suffered a TIA in the past.  He is unable to provide a date of the event.  Patient has no resulting neurological deficits resulting from reported neurological event.  Patient has a diagnosis of cardiomyopathy.  Cardiac function has been serially monitored since diagnosis.  Most recent TTE performed on 08/22/2022 revealed a normal left ventricular systolic function with an EF of 55-60%. There were no regional wall motion abnormalities.  Left ventricular internal cavity size mildly dilated.  Left ventricular diastolic Doppler parameters were indeterminant right ventricular size and function normal.  There was trivial tricuspid and mild pulmonary valve regurgitation.  All transvalvular gradients were noted to be normal providing no evidence of hemodynamically significant valvular stenosis. Aorta normal in size with no evidence of ectasia or aneurysmal dilatation.  Patient underwent diagnostic RIGHT heart catheterization on 10/07/2022.  Hemodynamics: mean RA = 2 mmHg, mean PA = 15 mmHg, mean PCWP = 3 mmHg, PA saturation = 74%, AO saturation = 96%, cardiac output = 9.71 L/min, and CI = 3.34 L/min/m.  Patient with an atrial fibrillation diagnosis; CHA2DS2-VASc Score = 5 (HFpEF, HTN, TIA x 2, T2DM). His rate and rhythm are currently being maintained on oral diltiazem  + metoprolol  succinate. He is chronically anticoagulated using apixaban ; reported  to be compliant with therapy with no evidence or  reports of GI bleeding.  Heart failure and blood pressure well controlled at 110/69 mmHg on currently prescribed CCB (diltiazem ), diuretic (furosemide  + spironolactone ), beta-blocker (metoprolol  succinate) and ARB/ARNI (Entresto ) therapies. He is on a atorvastatin  for his HLD diagnosis and further ASCVD prevention. T2DM well controlled on currently prescribed regimen; last HgbA1c was 5.8% when checked on 08/05/2022.  Patient does have a severe OSAH diagnosis and requires nocturnal PAP therapy.  He is reported to be compliant with prescribed BiPAP therapy.  Functional capacity limited by morbid obesity, supplemental oxygen use, and patient's multiple medical comorbidities.  With that being said, patient able to complete all of his ADLs/IADLs without cardiovascular limitation.  Per the DASI,  patient still felt to be able to achieve at least 4 METS of physical activity without experiencing any degree of angina/anginal equivalent symptoms.  No changes were made to his medication regimen.  Patient to follow-up with advanced heart failure clinic as scheduled in 11/2023.  He will follow-up with general cardiology in 1 year or sooner if needed.  [name] is scheduled for an elective CYSTOSCOPY; CYSTOURETHROSCOPY, WITH URETHRAL STRICTURE DILATION USING DRUG-COATED BALLOON on 02/08/2024 with Dr. Glendia JAYSON Barba, MD. Given patient's past medical history significant for cardiovascular diagnoses, presurgical cardiac clearance was sought by the PAT team. Per cardiology, symptoms stable, weight down 19 pounds in the last month. Has been unable to wear bipap due to it needing serviced. Current RCRI risk assessment is 1.1% of major cardiac event. He would be considered a major risk.  Again, this patient is on daily oral anticoagulation therapy using a DOAC medication.  He has been instructed on recommendations for holding his apixaban  for 2 days prior to his procedure  with plans to restart as soon as postoperative bleeding risk felt to be minimized by his primary attending surgeon. The patient has been instructed that his last dose should be on 02/05/2024.  Patient denies previous perioperative complications with anesthesia in the past. In review his EMR, it is noted that patient underwent a general anesthetic course here at [facility] ([facility] in 08/2022 without documented complications.   MOST RECENT VITAL SIGNS:    02/01/2024   11:13 AM 01/15/2024   12:08 PM 01/02/2024    2:23 PM  Vitals with BMI  Height 6' 3  6' 3.5  Weight 340 lbs 346 lbs 350 lbs  BMI 42.5 42.66 43.16  Systolic  130 96  Diastolic  68 61  Pulse  65 82   PROVIDERS/SPECIALISTS: NOTE: Primary physician provider listed below. Patient may have been seen by APP or partner within same practice.   PROVIDER ROLE / SPECIALTY LAST SHERLEAN Barba Glendia JAYSON, MD Urology (Surgeon) 01/02/2024  Gwenith Shuck, NP Primary Care Provider ???  End, Lonni, MD Cardiology 07/05/2023; preop APP call 01/15/2024  Rolan Barrack, MD Advanced Heart Failure 01/15/2024  Isaiah Scrivener, MD Pulmonary Medicine 08/31/2023   ALLERGIES: Allergies  Allergen Reactions   Haldol [Haloperidol] Other (See Comments)    SI   Semaglutide  Anxiety    Increase in suicidal thoughts   Dermatitis Antigen Rash   Meperidine     Meperidine   Abilify [Aripiprazole] Palpitations   Demerol [Meperidine Hcl] Hives   Tape Rash    Other reaction(s): Other (See Comments) Itching, skin tear    CURRENT HOME MEDICATIONS: No current facility-administered medications for this encounter.    albuterol  (PROVENTIL ) (2.5 MG/3ML) 0.083% nebulizer solution   allopurinol  (ZYLOPRIM ) 300 MG tablet  amoxicillin  (AMOXIL ) 875 MG tablet   apixaban  (ELIQUIS ) 5 MG TABS tablet   atorvastatin  (LIPITOR ) 80 MG tablet   baclofen  (LIORESAL ) 10 MG tablet   benztropine  (COGENTIN ) 1 MG tablet    budesonide -formoterol  (SYMBICORT ) 160-4.5 MCG/ACT inhaler   CRANBERRY EXTRACT PO   cyclobenzaprine  (FLEXERIL ) 5 MG tablet   dapagliflozin  propanediol (FARXIGA ) 10 MG TABS tablet   diltiazem  (CARDIZEM  CD) 360 MG 24 hr capsule   divalproex  (DEPAKOTE ) 500 MG DR tablet   docusate sodium  (COLACE) 100 MG capsule   Dulaglutide  (TRULICITY ) 1.5 MG/0.5ML SOAJ   escitalopram  (LEXAPRO ) 20 MG tablet   fluticasone  (FLONASE ) 50 MCG/ACT nasal spray   fluticasone  furoate-vilanterol (BREO ELLIPTA ) 100-25 MCG/ACT AEPB   furosemide  (LASIX ) 20 MG tablet   hydrOXYzine  (VISTARIL ) 25 MG capsule   lithium  300 MG tablet   lithium  300 MG tablet   loratadine  (CLARITIN ) 10 MG tablet   melatonin 3 MG TABS tablet   metoprolol  succinate (TOPROL  XL) 100 MG 24 hr tablet   Multiple Vitamins-Minerals (MULTIVITAMIN GUMMIES ADULT PO)   ondansetron  (ZOFRAN -ODT) 4 MG disintegrating tablet   paliperidone  (INVEGA ) 9 MG 24 hr tablet   polyethylene glycol (MIRALAX / GLYCOLAX) 17 g packet   potassium chloride  SA (KLOR-CON  M) 20 MEQ tablet   propranolol (INDERAL) 10 MG tablet   sacubitril -valsartan  (ENTRESTO ) 24-26 MG   sodium chloride  (OCEAN) 0.65 % SOLN nasal spray   spironolactone  (ALDACTONE ) 25 MG tablet   tamsulosin  (FLOMAX ) 0.4 MG CAPS capsule   HISTORY: Past Medical History:  Diagnosis Date   (HFpEF) heart failure with preserved ejection fraction (HCC)    Cardiomyopathy (HCC)    a.) TTE 08/04/2021: EF 50%; b.) TTE 10/22/2021: EF 45-50-%; c.) TTE 08/22/2022: EF 55-60%   Chest pain    Conversion disorder    Dyspnea    GERD (gastroesophageal reflux disease)    Hepatic steatosis    Hyperlipidemia    Hypertension    Insomnia    a.) melatonin PRN   On apixaban  therapy    OSA on CPAP    Persistent atrial fibrillation and flutter (HCC)    a.) CHA2DS2VASc = 5 (HFpEF, HTN, TIA x 2, T2DM);  b.) rate/rhythm maintained on oral diltiazem  + metoprolol  succinate; chronically anticoagulated with apixaban    Schizoaffective  disorder (HCC)    Seizure disorder (HCC)    T2DM (type 2 diabetes mellitus) (HCC)    TIA (transient ischemic attack)    Urethral stricture    a. 08/2021 s/p cystoscopy and urethral dilation.   Past Surgical History:  Procedure Laterality Date   CYSTOSCOPY WITH URETHRAL DILATATION N/A 09/07/2021   Procedure: CYSTOSCOPY WITH URETHRAL DILATATION CATHETER PLACEMENT;  Surgeon: Twylla Glendia BROCKS, MD;  Location: [facility] ORS;  Service: Urology;  Laterality: N/A;   CYSTOSCOPY WITH URETHRAL DILATATION N/A 09/06/2022   Procedure: CYSTOSCOPY WITH URETHRAL BALLOON DILATATION USING OPTILUME;  Surgeon: Twylla Glendia BROCKS, MD;  Location: [facility] ORS;  Service: Urology;  Laterality: N/A;   RIGHT HEART CATH Right 10/07/2022   Procedure: RIGHT HEART CATH;  Surgeon: Rolan Ezra RAMAN, MD;  Location: [facility] INVASIVE CV LAB;  Service: Cardiovascular;  Laterality: Right;   Family History  Problem Relation Age of Onset   Colon cancer Father    Breast cancer Paternal Grandmother    Social History   Tobacco Use   Smoking status: Former    Current packs/day: 0.00    Types: Cigarettes    Start date: 08/2019    Quit date: 08/2021    Years since  quitting: 2.4   Smokeless tobacco: Never  Substance Use Topics   Alcohol use: Not Currently   LABS:  Appointment on 01/30/2024  Component Date Value Ref Range Status   Specific Gravity, UA 01/30/2024 1.010  1.005 - 1.030 Final   pH, UA 01/30/2024 7.0  5.0 - 7.5 Final   Color, UA 01/30/2024 Yellow  Yellow Final   Appearance Ur 01/30/2024 Slightly cloudy  Clear Final   Leukocytes,UA 01/30/2024 1+ (A)  Negative Final   Protein,UA 01/30/2024 Negative  Negative/Trace Final   Glucose, UA 01/30/2024 3+ (A)  Negative Final   Ketones, UA 01/30/2024 Negative  Negative Final   RBC, UA 01/30/2024 Negative  Negative Final   Bilirubin, UA 01/30/2024 Negative  Negative Final   Urobilinogen, Ur 01/30/2024 1.0  0.2 - 1.0 mg/dL Final   Nitrite, UA 90/97/7974 Negative  Negative Final    Microscopic Examination 01/30/2024 See below:   Final   Microscopic was indicated and was performed.   Urine Culture, Comprehensive 01/30/2024 Final report (A)   Final   Organism ID, Bacteria 01/30/2024 Aerococcus urinae (A)   Final   Comment: 50,000-100,000 colony forming units per mL Aerococcus urinae has been described as susceptible to penicillin, amoxicillin , piperacillin, cefepime, rifampin, and nitrofurantoin, and resistant to sulfonamides. Presumptive identification    WBC, UA 01/30/2024 >30 (A)  0 - 5 /hpf Final   RBC, Urine 01/30/2024 0-2  0 - 2 /hpf Final   Epithelial Cells (non renal) 01/30/2024 >10 (A)  0 - 10 /hpf Final   Mucus, UA 01/30/2024 Present (A)  Not Estab. Final   Bacteria, UA 01/30/2024 Many (A)  None seen/Few Final  Hospital Outpatient Visit on 01/15/2024  Component Date Value Ref Range Status   Magnesium 01/15/2024 2.2  1.7 - 2.4 mg/dL Final   Performed at [facility], [address] [place], [place] [address]   Sodium 01/15/2024 136  135 - 145 mmol/L Final   Potassium 01/15/2024 4.4  3.5 - 5.1 mmol/L Final   Chloride 01/15/2024 97 (L)  98 - 111 mmol/L Final   CO2 01/15/2024 29  22 - 32 mmol/L Final   Glucose, Bld 01/15/2024 91  70 - 99 mg/dL Final   Glucose reference range applies only to samples taken after fasting for at least 8 hours.   BUN 01/15/2024 21 (H)  6 - 20 mg/dL Final   Creatinine, Ser 01/15/2024 1.19  0.61 - 1.24 mg/dL Final   Calcium  01/15/2024 9.2  8.9 - 10.3 mg/dL Final   GFR, Estimated 01/15/2024 >60  >60 mL/min Final   Comment: (NOTE) Calculated using the CKD-EPI Creatinine Equation (2021)    Anion gap 01/15/2024 10  5 - 15 Final   Performed at [facility], [address] [place], [place] [address]    ECG: Date: 01/15/2024  Time ECG obtained: 1206 PM Rate: 67 bpm Rhythm: normal sinus Axis (leads I and aVF): normal Intervals: PR 154 ms. QRS 94 ms. QTc 458 ms. ST segment and T wave changes: Non-specific  T-wave abnormality Evidence of a possible, age undetermined, prior infarct:  No Comparison: Similar to previous tracing obtained on 10/02/2023   IMAGING / PROCEDURES: CT ANGIO GI BLEED performed on 10/09/2023 Negative CTA.   No active or localized source of bleeding. Generalized colonic stool. For radiation dose monitoring purposes, note the patient has had ~40 CTs in the past 2 years, many multiphase and most negative exams.  CT ANGIO HEAD NECK W WO CM performed on 10/05/2023  No large vessel occlusion or proximal hemodynamically significant stenosis.  RIGHT HEART CATHETERIZATION performed on 10/07/2022 Hemodynamics (mmHg) RA mean 2 RV 20/2 PA 23/8, mean 15 PCWP mean 3 PA 74% AO 96% Cardiac Output (Fick) 9.71  Cardiac Index (Fick) 3.34   DG CHEST 2 VIEW performed on 10/02/2023 The cardiomediastinal contours are normal.  The lungs are clear.  Pulmonary vasculature is normal.  No consolidation, pleural effusion, or pneumothorax.  No acute osseous abnormalities are seen. No active cardiopulmonary disease.  TRANSTHORACIC ECHOCARDIOGRAM performed on 08/22/2022 Left ventricular ejection fraction, by estimation, is 55 to 60%. The left ventricle has normal function. The left ventricle has no regional wall motion abnormalities. The left ventricular internal cavity size was mildly dilated. Left ventricular diastolic function could not be evaluated.  Right ventricular systolic function is normal. The right ventricular size is normal. Tricuspid regurgitation signal is inadequate for assessing PA pressure.  The mitral valve is normal in structure. No evidence of mitral valve regurgitation. No evidence of mitral stenosis.  The aortic valve is normal in structure. Aortic valve regurgitation is not visualized. No aortic stenosis is present.  Limited images. Only parasternal images obtained.    PULMONARY FUNCTION TESTING performed on 07/28/2022     Latest Ref Rng & Units 07/28/2022    2:05  PM  PFT Results  FVC-Pre L 4.68   FVC-Predicted Pre % 70   Pre FEV1/FVC % % 86   FEV1-Pre L 4.02   FEV1-Predicted Pre % 75   DLCO uncorrected ml/min/mmHg 36.73   DLCO UNC% % 92   DLVA Predicted % 122   TLC L 6.91   TLC % Predicted % 83   RV % Predicted % 130     CT CHEST HIGH RESOLUTION performed on 07/11/2022 No evidence of fibrotic interstitial lung disease. Mild lobular air trapping on expiratory phase imaging, suggestive of small airways disease. No acute abnormality of the lungs.  IMPRESSION AND PLAN: Geoge Lawrance has been referred for pre-anesthesia review and clearance prior to him undergoing the planned anesthetic and procedural courses. Available labs, pertinent testing, and imaging results were personally reviewed by me in preparation for upcoming operative/procedural course. [facility] medical record has been updated following extensive record review and patient interview with PAT staff.   This patient has been appropriately cleared by cardiology with an overall HIGH/ACCEPTABLE risk of patient experiencing significant perioperative cardiovascular complications. Based on clinical review performed today (02/06/24), barring any significant acute changes in the patient's overall condition, it is anticipated that he will be able to proceed with the planned surgical intervention. Any acute changes in clinical condition may necessitate his procedure being postponed and/or cancelled. Patient will meet with anesthesia team (MD and/or CRNA) on the day of his procedure for preoperative evaluation/assessment. Questions regarding anesthetic course will be fielded at that time.   Pre-surgical instructions were reviewed with the patient during his PAT appointment, and questions were fielded to satisfaction by PAT clinical staff. He has been instructed on which medications that he will need to hold prior to surgery, as well as the ones that have been deemed safe/appropriate to take on the day  of his procedure. As part of the general education provided by PAT, patient made aware both verbally and in writing, that he would need to abstain from the use of any illegal substances during his perioperative course. He was advised that failure to follow the provided instructions could necessitate case cancellation or result in serious perioperative complications up to and  including death. Patient encouraged to contact PAT and/or his surgeon's office to discuss any questions or concerns that may arise prior to surgery; verbalized understanding.   Dorise Pereyra, MSN, APRN, FNP-C, CEN [facility]  Perioperative Services Nurse Practitioner Phone: [phone] Fax: [phone] 02/06/24 2:30 PM  NOTE: This note has been prepared using Dragon dictation software. Despite my best ability to proofread, there is always the potential that unintentional transcriptional errors may still occur from this process.

## 2024-02-07 ENCOUNTER — Encounter: Payer: Self-pay | Admitting: Urology

## 2024-02-08 ENCOUNTER — Encounter
Admission: RE | Disposition: A | Discharge status: Home / Self Care | Payer: Self-pay | Source: Home / Self Care | Attending: Urology

## 2024-02-08 ENCOUNTER — Ambulatory Visit
Admission: RE | Admit: 2024-02-08 | Discharge: 2024-02-08 | Disposition: A | Discharge status: Home / Self Care | Payer: MEDICAID | Attending: Urology | Admitting: Urology

## 2024-02-08 DIAGNOSIS — E119 Type 2 diabetes mellitus without complications: Secondary | ICD-10-CM | POA: Insufficient documentation

## 2024-02-08 DIAGNOSIS — N35812 Other urethral bulbous stricture, male: Secondary | ICD-10-CM

## 2024-02-08 DIAGNOSIS — G4733 Obstructive sleep apnea (adult) (pediatric): Secondary | ICD-10-CM | POA: Insufficient documentation

## 2024-02-08 DIAGNOSIS — Z539 Procedure and treatment not carried out, unspecified reason: Secondary | ICD-10-CM | POA: Insufficient documentation

## 2024-02-08 DIAGNOSIS — G40909 Epilepsy, unspecified, not intractable, without status epilepticus: Secondary | ICD-10-CM | POA: Insufficient documentation

## 2024-02-08 DIAGNOSIS — Z87891 Personal history of nicotine dependence: Secondary | ICD-10-CM | POA: Insufficient documentation

## 2024-02-08 DIAGNOSIS — I110 Hypertensive heart disease with heart failure: Secondary | ICD-10-CM | POA: Insufficient documentation

## 2024-02-08 DIAGNOSIS — I5032 Chronic diastolic (congestive) heart failure: Secondary | ICD-10-CM | POA: Insufficient documentation

## 2024-02-08 DIAGNOSIS — E785 Hyperlipidemia, unspecified: Secondary | ICD-10-CM | POA: Insufficient documentation

## 2024-02-08 DIAGNOSIS — N35819 Other urethral stricture, male, unspecified site: Secondary | ICD-10-CM | POA: Insufficient documentation

## 2024-02-08 DIAGNOSIS — R300 Dysuria: Secondary | ICD-10-CM | POA: Diagnosis present

## 2024-02-08 DIAGNOSIS — I4819 Other persistent atrial fibrillation: Secondary | ICD-10-CM | POA: Diagnosis not present

## 2024-02-08 DIAGNOSIS — Z8673 Personal history of transient ischemic attack (TIA), and cerebral infarction without residual deficits: Secondary | ICD-10-CM | POA: Diagnosis not present

## 2024-02-08 DIAGNOSIS — F259 Schizoaffective disorder, unspecified: Secondary | ICD-10-CM | POA: Diagnosis not present

## 2024-02-08 DIAGNOSIS — Z6841 Body Mass Index (BMI) 40.0 and over, adult: Secondary | ICD-10-CM | POA: Insufficient documentation

## 2024-02-08 DIAGNOSIS — E875 Hyperkalemia: Secondary | ICD-10-CM

## 2024-02-08 DIAGNOSIS — I429 Cardiomyopathy, unspecified: Secondary | ICD-10-CM | POA: Diagnosis not present

## 2024-02-08 HISTORY — DX: Unspecified diastolic (congestive) heart failure: I50.30

## 2024-02-08 HISTORY — DX: Long term (current) use of anticoagulants: Z79.01

## 2024-02-08 SURGERY — CYSTOSCOPY
Anesthesia: Monitor Anesthesia Care

## 2024-02-08 MED ORDER — PROPOFOL 1000 MG/100ML IV EMUL
INTRAVENOUS | Status: AC
  Filled 2024-02-08: qty 100

## 2024-02-08 MED ORDER — CHLORHEXIDINE GLUCONATE 0.12 % MT SOLN
OROMUCOSAL | Status: AC
Start: 2024-02-08 — End: 2024-02-08
  Filled 2024-02-08: qty 15

## 2024-02-08 MED ORDER — SODIUM CHLORIDE 0.9 % IV SOLN: INTRAVENOUS | Status: DC

## 2024-02-08 MED ORDER — CHLORHEXIDINE GLUCONATE 0.12 % MT SOLN: 15.0000 mL | Freq: Once | OROMUCOSAL | Status: DC

## 2024-02-08 MED ORDER — SEVOFLURANE IN SOLN
RESPIRATORY_TRACT | Status: AC
  Filled 2024-02-08: qty 250

## 2024-02-08 MED ORDER — MIDAZOLAM HCL 2 MG/2ML IJ SOLN
INTRAMUSCULAR | Status: AC
  Filled 2024-02-08: qty 2

## 2024-02-08 MED ORDER — SODIUM CHLORIDE 0.9 % IV SOLN
1.0000 g | INTRAVENOUS | Status: DC
  Filled 2024-02-08: qty 10

## 2024-02-08 MED ORDER — LIDOCAINE HCL (PF) 2 % IJ SOLN
INTRAMUSCULAR | Status: AC
  Filled 2024-02-08: qty 5

## 2024-02-08 MED ORDER — ORAL CARE MOUTH RINSE: 15.0000 mL | Freq: Once | OROMUCOSAL | Status: DC

## 2024-02-08 MED ORDER — FENTANYL CITRATE (PF) 100 MCG/2ML IJ SOLN
INTRAMUSCULAR | Status: AC
  Filled 2024-02-08: qty 2

## 2024-02-08 NOTE — Progress Notes (Signed)
   02/08/24 0830  Spiritual Encounters  Type of Visit Initial  Care provided to: Patient  Conversation partners present during encounter Nurse  Referral source Nurse (RN/NT/LPN)  Reason for visit Surgical  OnCall Visit Yes   Chaplain visited patient who was scheduled to have surgery.  Patient wanted to speak to his Juliene.  Chaplain provided patient with phone number for his [place].  Patient learned that he will not have surgery today and so Chaplain was able to offer presence as he heard the news.  Chaplain and patient had met in the ED some time ago and found connection in his love of jewelry.    Rev. Rana M. Nicholaus, M.Div. Chaplain Resident [facility]

## 2024-02-08 NOTE — Progress Notes (Signed)
 Pt PAT meds were reviewed and it was noted that the patient had not started his Amoxicillin  for his UTI. Because the patient had not yet started his antibiotics, Dr. Twylla decided to reschedule his procedure. Pt and caregiver Kandy) were notified of the decision to reschedule. Pt was dressed and Karna came via UBER to pick him up.

## 2024-02-14 ENCOUNTER — Telehealth: Payer: Self-pay | Admitting: Family

## 2024-02-14 NOTE — Telephone Encounter (Signed)
 Called to confirm/remind patient of their appointment at the Advanced Heart Failure Clinic on 02/15/24.   Appointment:   [] Confirmed  [x] Left mess   [] No answer/No voice mail  [] VM Full/unable to leave message  [] Phone not in service  Patient reminded to bring all medications and/or complete list.  Confirmed patient has transportation. Gave directions, instructed to utilize valet parking.

## 2024-02-15 ENCOUNTER — Encounter: Payer: Self-pay | Admitting: Cardiology

## 2024-02-15 ENCOUNTER — Ambulatory Visit: Payer: MEDICAID | Attending: Cardiology | Admitting: Cardiology

## 2024-02-15 VITALS — BP 99/54 | HR 88 | Wt 333.2 lb

## 2024-02-15 DIAGNOSIS — Z9981 Dependence on supplemental oxygen: Secondary | ICD-10-CM | POA: Diagnosis not present

## 2024-02-15 DIAGNOSIS — I10 Essential (primary) hypertension: Secondary | ICD-10-CM

## 2024-02-15 DIAGNOSIS — Z87891 Personal history of nicotine dependence: Secondary | ICD-10-CM | POA: Diagnosis not present

## 2024-02-15 DIAGNOSIS — Z7901 Long term (current) use of anticoagulants: Secondary | ICD-10-CM | POA: Insufficient documentation

## 2024-02-15 DIAGNOSIS — G4733 Obstructive sleep apnea (adult) (pediatric): Secondary | ICD-10-CM | POA: Insufficient documentation

## 2024-02-15 DIAGNOSIS — I480 Paroxysmal atrial fibrillation: Secondary | ICD-10-CM | POA: Diagnosis not present

## 2024-02-15 DIAGNOSIS — Z6841 Body Mass Index (BMI) 40.0 and over, adult: Secondary | ICD-10-CM | POA: Insufficient documentation

## 2024-02-15 DIAGNOSIS — F259 Schizoaffective disorder, unspecified: Secondary | ICD-10-CM | POA: Insufficient documentation

## 2024-02-15 DIAGNOSIS — I5032 Chronic diastolic (congestive) heart failure: Secondary | ICD-10-CM | POA: Insufficient documentation

## 2024-02-15 DIAGNOSIS — Z79899 Other long term (current) drug therapy: Secondary | ICD-10-CM | POA: Diagnosis not present

## 2024-02-15 DIAGNOSIS — Z87440 Personal history of urinary (tract) infections: Secondary | ICD-10-CM | POA: Insufficient documentation

## 2024-02-15 DIAGNOSIS — J988 Other specified respiratory disorders: Secondary | ICD-10-CM | POA: Diagnosis not present

## 2024-02-15 DIAGNOSIS — I110 Hypertensive heart disease with heart failure: Secondary | ICD-10-CM | POA: Insufficient documentation

## 2024-02-15 DIAGNOSIS — J9611 Chronic respiratory failure with hypoxia: Secondary | ICD-10-CM | POA: Diagnosis not present

## 2024-02-15 DIAGNOSIS — E669 Obesity, unspecified: Secondary | ICD-10-CM | POA: Insufficient documentation

## 2024-02-15 NOTE — Patient Instructions (Addendum)
 Medication Changes:  DISCONTINUE PROPRANOLOL AND FARXIGA    Lab Work:  Go downstairs to [place] on LOWER LEVEL to have your blood work completed.  We will only call you if the results are abnormal or if the provider would like to make medication changes.  No news is good news.   Special Instructions // Education:  WEAR COMPRESSION STOCKINGS EVERYDAY AND REMOVE AT BEDTIME.  PLEASE CALL [facility] AT [phone] TO GET HELP WITH YOUR BIPAP MACHINE.   Follow-Up in: 4 MONTHS WITH DR. ROLAN.  Our Doctors' schedules are NOT open yet for 4 months. We will place you on our recall list. Once they are available, we will call you to schedule your follow up appointment.    Thank you for choosing  [facility].    At the Advanced Heart Failure Clinic, you and your health needs are our priority. We have a designated team specialized in the treatment of Heart Failure. This Care Team includes your primary Heart Failure Specialized Cardiologist (physician), Advanced Practice Providers (APPs- Physician Assistants and Nurse Practitioners), and Pharmacist who all work together to provide you with the care you need, when you need it.   You may see any of the following providers on your designated Care Team at your next follow up:  Dr. Toribio Fuel Dr. Ezra ROLAN Dr. Ria Commander Dr. Morene Brownie Ellouise Class, FNP Jaun Bash, RPH-CPP  Please be sure to bring in all your medications bottles to every appointment.   Need to Contact Us :  If you have any questions or concerns before your next appointment please send us  a message through [place] or call our office at [phone].    TO LEAVE A MESSAGE FOR THE NURSE SELECT OPTION 2, PLEASE LEAVE A MESSAGE INCLUDING: YOUR NAME DATE OF BIRTH CALL BACK NUMBER REASON FOR CALL**this is important as we prioritize the call backs  YOU WILL RECEIVE A CALL BACK THE SAME DAY AS LONG  AS YOU CALL BEFORE 4:00 PM

## 2024-02-15 NOTE — Progress Notes (Signed)
 PCP: Gwenith Shuck, NP Cardiology: Dr. Mady HF Cardiology: Dr. Rolan  Chief complaint: CHF  33 y.o. with history of paroxysmal atrial fibrillation, HF with mid range EF, schizoaffective disorder, and chronic hypoxemic respiratory failure on home oxygen presents for evaluation of CHF.  Patient has had a long history of paroxysmal AF.  He says this was diagnosed when he was 20 and he was told that it was triggered by excessive use of energy drinks.  He never abused drugs or ETOH per his report though he was a smoker until 5/23.  He was followed in [place] in the past.  There was talk of atrial fibrillation ablation but it was never undertaken.  Subsequently, he moved to a group home in [place].  He has been followed by [facility].  He was hospitalized in early 2023 with atrial fibrillation and CHF.  Echo in 3/23 showed EF 50% and echo in 5/23 showed EF 45-50%, both instances it appeared he was in atrial fibrillation.  He has been in NSR in this office, however.     Patient has had home oxygen.  High resolution CT chest in 2/24 showed no ILD, possible small airways disease.  PFTs in 2/24 showed minimal obstructive airways disease.  Sleep study showed severe OSA.    Echo in 3/24 showed EF 55-60%, mild LV dilation, normal RV.  RHC in 5/24 showed normal filling pressures and preserved cardiac output.   Patient has not been using home oxygen.  He has lost 13 lbs since last appointment.  He has had frequent UTIs over the last few months.  At one point, he required a foley catheter. He is not using Bipap currently as it needs servicing.  Mild dyspnea with long walks and stairs, but generally doing quite well.  He walks for about 15 minutes at a time for exercise. No chest pain.  No orthopnea/PND.  No lightheadedness or palpitations.   Labs (12/23): K 4, creatinine 0.78, BNP 11 Labs (1/24): BNP 85 Labs (2/24): K 3.9, creatinine 1.07 Labs (8/25): K 4.4, creatinine 1.19  PMH: 1. Gout 2. Atrial  fibrillation: Paroxysmal.  First noted at age 59 per his report.  Grandmother and grandfather had AF but at advanced age.  3. HTN 4. OSA: severe on sleep study 5. Chronic hypoxemic respiratory failure: Patient states he has been using oxygen since 2022.  - Prior smoker, quit 4/23.  - High resolution CT chest (2/24): no ILD, possible small airways disease - PFTs (2/24): minimal obstructive airways disease.  6. H/o ?TIA 7. H/o urethral stricture 8. Schizoaffective disorder: Lives in group home.  9. H/o seizure disorder.  10. H/o COVID-19 x 3 11. Hyperlipidemia 12. HF with mid range EF: Echo (3/23) with EF 50%, suspect was in AF.   - Echo (5/23): EF 45-50%, RV normal IVC normal.  In setting of AF.  - Echo (3/24): EF 55-60%, mild LV dilation, normal RV. 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  Quit smoking 4/23.  No drugs or ETOH.   FH: Grandfather with MIs, grandmother with AF at advanced age, other grandfather with AF at advanced age.   ROS: All systems reviewed and negative except as per HPI.   Current Outpatient Medications  Medication Sig Dispense Refill   albuterol  (PROVENTIL ) (2.5 MG/3ML) 0.083% nebulizer solution Take 3 mLs (2.5 mg total) by nebulization every 6 (six) hours as needed for wheezing  or shortness of breath. 75 mL 2   allopurinol  (ZYLOPRIM ) 300 MG tablet Take 1 tablet (300 mg total) by mouth daily. 30 tablet 2   amoxicillin  (AMOXIL ) 875 MG tablet Take 1 tablet (875 mg total) by mouth every 12 (twelve) hours. 14 tablet 0   apixaban  (ELIQUIS ) 5 MG TABS tablet Take 1 tablet (5 mg total) by mouth 2 (two) times daily. 60 tablet 0   atorvastatin  (LIPITOR ) 80 MG tablet Take 1 tablet (80 mg total) by mouth daily. 30 tablet 11   baclofen  (LIORESAL ) 10 MG tablet Take 1 tablet (10 mg total) by mouth 2 (two) times daily. 60 tablet 1   benztropine  (COGENTIN ) 1 MG tablet Take 1 tablet (1 mg total) by mouth in  the morning, at noon, and at bedtime. 30 tablet 2   budesonide -formoterol  (SYMBICORT ) 160-4.5 MCG/ACT inhaler Inhale 2 puffs into the lungs 2 (two) times daily. 1 each 6   CRANBERRY EXTRACT PO Take by mouth.     cyclobenzaprine  (FLEXERIL ) 5 MG tablet Take 5 mg by mouth 3 (three) times daily as needed for muscle spasms.     diltiazem  (CARDIZEM  CD) 360 MG 24 hr capsule Take 1 capsule (360 mg total) by mouth daily. 30 capsule 11   divalproex  (DEPAKOTE ) 500 MG DR tablet Take 2 tablets (1,000 mg total) by mouth 2 (two) times daily. 120 tablet 0   docusate sodium  (COLACE) 100 MG capsule Take 100 mg by mouth 2 (two) times daily.     Dulaglutide  (TRULICITY ) 1.5 MG/0.5ML SOAJ Inject 1.5 mg into the skin once a week. Every Sunday 5 mL 3   escitalopram  (LEXAPRO ) 20 MG tablet Take 1 tablet (20 mg total) by mouth daily. (Patient taking differently: Take 10 mg by mouth daily.) 30 tablet 2   fluticasone  (FLONASE ) 50 MCG/ACT nasal spray Place 2 sprays into both nostrils 2 (two) times daily. 1 g 10   furosemide  (LASIX ) 20 MG tablet Take 3 tablets (60 mg total) by mouth daily. 27090 tablet 3   hydrOXYzine  (VISTARIL ) 25 MG capsule Take 25-50 mg by mouth 3 (three) times daily. 50 mg qam, 25 mg at lunch, 50 mg evening     lithium  300 MG tablet Take 1 tablet (300 mg total) by mouth daily. 30 tablet 2   lithium  300 MG tablet Take 2 tablets (600 mg total) by mouth every evening. 30 tablet 2   loratadine  (CLARITIN ) 10 MG tablet Take 10 mg by mouth daily.     melatonin 3 MG TABS tablet Take 3 mg by mouth at bedtime.     metoprolol  succinate (TOPROL  XL) 100 MG 24 hr tablet Take 1.5 tablets (150 mg total) by mouth daily. Take with or immediately following a meal. 45 tablet 0   ondansetron  (ZOFRAN -ODT) 4 MG disintegrating tablet Take 1 tablet (4 mg total) by mouth every 8 (eight) hours as needed. 20 tablet 0   paliperidone  (INVEGA ) 9 MG 24 hr tablet Take 1 tablet (9 mg total) by mouth every morning. 30 tablet 0   polyethylene  glycol (MIRALAX / GLYCOLAX) 17 g packet Take 17 g by mouth daily.     potassium chloride  SA (KLOR-CON  M) 20 MEQ tablet Take 1 tablet (20 mEq total) by mouth daily. 30 tablet 5   sacubitril -valsartan  (ENTRESTO ) 24-26 MG Take 1 tablet by mouth 2 (two) times daily.     sodium chloride  (OCEAN) 0.65 % SOLN nasal spray Place 1 spray into both nostrils as needed for congestion. 10 mL 5  spironolactone  (ALDACTONE ) 25 MG tablet Take 0.5 tablets (12.5 mg total) by mouth daily. 45 tablet 3   tamsulosin  (FLOMAX ) 0.4 MG CAPS capsule Take 1 capsule (0.4 mg total) by mouth daily. 30 capsule 2   fluticasone  furoate-vilanterol (BREO ELLIPTA ) 100-25 MCG/ACT AEPB Inhale 1 puff into the lungs daily. (Patient not taking: Reported on 02/15/2024)     Multiple Vitamins-Minerals (MULTIVITAMIN GUMMIES ADULT PO) Take by mouth. (Patient not taking: Reported on 02/15/2024)     No current facility-administered medications for this visit.   BP (!) 99/54   Pulse 88   Wt (!) 333 lb 3.2 oz (151.1 kg)   SpO2 98%   BMI 41.65 kg/m  General: NAD, obese.  Neck: No JVD, no thyromegaly or thyroid  nodule.  Lungs: Clear to auscultation bilaterally with normal respiratory effort. CV: Nondisplaced PMI.  Heart regular S1/S2, no S3/S4, no murmur.  No peripheral edema.  No carotid bruit.  Normal pedal pulses.  Abdomen: Soft, nontender, no hepatosplenomegaly, no distention.  Skin: Intact without lesions or rashes.  Neurologic: Alert and oriented x 3.  Psych: Normal affect. Extremities: No clubbing or cyanosis.  HEENT: Normal.   Assessment/Plan: 1. Atrial fibrillation: Paroxysmal.  Onset at very early age (20 per his report).  He has a family history of AF but others were of advanced age when they had AF.  ?Related to OHS/OSA and obesity.  Also used to abuse energy drinks per his report.  Recently, he seems to be staying in NSR.  - Continue Apixaban .  - Continue diltiazem  CD.  - Continue Toprol  XL 150 mg daily. Stop propranolol, would  avoid 2 different beta blockers.  - Needs weight loss and treatment of sleep apnea, see below.  - Would pursue atrial fibrillation ablation if he has recurrence of atrial fibrillation once he has lost some additional weight.   2. HFpEF: Echo in 3/23 showed EF 50% and echo in 5/23 showed EF 45-50%, both instances it appeared he was in atrial fibrillation.  Echo in 3/24 in NSR showed EF 55-60%. RHC in 5/24 was a normal study.  NYHA class II, not volume overloaded on exam.  - Stop Farxiga  with frequent UTIs and recent need for foley.  - Continue Lasix  60 mg daily. BMET/BNP today.  - Continue Entresto  24/26 bid.  - He does not seem to be having as much trouble managing his volume.  If symptoms worsen in the future, could consider Cardiomems.  3. OSA: Severe OSA on sleep study.   - Has Bipap but needs to have it serviced.  He plans to call.  4. Obesity: Weight is trending down on dulaglutide .   5. Chronic hypoxemic respiratory failure: He has been on home oxygen in the past but is not using it currently. Possible OHS/OSA as HRCT in 2/24 showed no ILD or significant emphysema and PFTs in 2/24 showed only minimal obstructive airways disease. He has severe OSA.    Followup with me in 4 months.   I spent 31 minutes reviewing records, interviewing/examining patient, and managing orders.   Daniel [name] 02/15/2024

## 2024-02-16 LAB — BASIC METABOLIC PANEL WITH GFR
BUN/Creatinine Ratio: 15 (ref 9–20)
BUN: 20 mg/dL (ref 6–20)
CO2: 24 mmol/L (ref 20–29)
Calcium: 9.8 mg/dL (ref 8.7–10.2)
Chloride: 97 mmol/L (ref 96–106)
Creatinine, Ser: 1.32 mg/dL — ABNORMAL HIGH (ref 0.76–1.27)
Glucose: 75 mg/dL (ref 70–99)
Potassium: 4.8 mmol/L (ref 3.5–5.2)
Sodium: 137 mmol/L (ref 134–144)
eGFR: 73 mL/min/1.73 (ref >59)

## 2024-02-16 LAB — CBC
Hematocrit: 41.2 % (ref 37.5–51.0)
Hemoglobin: 13.5 g/dL (ref 13.0–17.7)
MCH: 29.7 pg (ref 26.6–33.0)
MCHC: 32.8 g/dL (ref 31.5–35.7)
MCV: 91 fL (ref 79–97)
Platelets: 215 x10E3/uL (ref 150–450)
RBC: 4.54 x10E6/uL (ref 4.14–5.80)
RDW: 14.2 % (ref 11.6–15.4)
WBC: 7.6 x10E3/uL (ref 3.4–10.8)

## 2024-02-16 LAB — BRAIN NATRIURETIC PEPTIDE: BNP: 34.3 pg/mL (ref 0.0–100.0)

## 2024-02-18 ENCOUNTER — Ambulatory Visit (HOSPITAL_COMMUNITY): Payer: Self-pay | Admitting: Cardiology

## 2024-02-21 ENCOUNTER — Other Ambulatory Visit: Payer: Self-pay | Admitting: Family

## 2024-03-01 ENCOUNTER — Other Ambulatory Visit: Payer: Self-pay | Admitting: Family

## 2024-03-04 ENCOUNTER — Encounter: Payer: Self-pay | Admitting: Emergency Medicine

## 2024-03-04 ENCOUNTER — Emergency Department
Admission: EM | Admit: 2024-03-04 | Discharge: 2024-03-04 | Disposition: A | Discharge status: Home / Self Care | Payer: MEDICAID | Attending: Emergency Medicine | Admitting: Emergency Medicine

## 2024-03-04 ENCOUNTER — Other Ambulatory Visit: Payer: Self-pay

## 2024-03-04 DIAGNOSIS — Z7901 Long term (current) use of anticoagulants: Secondary | ICD-10-CM | POA: Insufficient documentation

## 2024-03-04 DIAGNOSIS — N390 Urinary tract infection, site not specified: Secondary | ICD-10-CM | POA: Insufficient documentation

## 2024-03-04 DIAGNOSIS — I110 Hypertensive heart disease with heart failure: Secondary | ICD-10-CM | POA: Insufficient documentation

## 2024-03-04 DIAGNOSIS — I509 Heart failure, unspecified: Secondary | ICD-10-CM | POA: Diagnosis not present

## 2024-03-04 DIAGNOSIS — S4992XA Unspecified injury of left shoulder and upper arm, initial encounter: Secondary | ICD-10-CM | POA: Diagnosis present

## 2024-03-04 DIAGNOSIS — E119 Type 2 diabetes mellitus without complications: Secondary | ICD-10-CM | POA: Diagnosis not present

## 2024-03-04 DIAGNOSIS — I4891 Unspecified atrial fibrillation: Secondary | ICD-10-CM | POA: Diagnosis not present

## 2024-03-04 DIAGNOSIS — S40022A Contusion of left upper arm, initial encounter: Secondary | ICD-10-CM | POA: Insufficient documentation

## 2024-03-04 DIAGNOSIS — W01198A Fall on same level from slipping, tripping and stumbling with subsequent striking against other object, initial encounter: Secondary | ICD-10-CM | POA: Insufficient documentation

## 2024-03-04 LAB — URINALYSIS, ROUTINE W REFLEX MICROSCOPIC
Bilirubin Urine: NEGATIVE
Glucose, UA: 50 mg/dL — AB
Hgb urine dipstick: NEGATIVE
Ketones, ur: NEGATIVE mg/dL
Nitrite: NEGATIVE
Protein, ur: NEGATIVE mg/dL
Specific Gravity, Urine: 1.006 (ref 1.005–1.030)
WBC, UA: >50 WBC/hpf (ref 0–5)
pH: 7 (ref 5.0–8.0)

## 2024-03-04 MED ORDER — NITROFURANTOIN MONOHYD MACRO 100 MG PO CAPS: 100.0000 mg | ORAL_CAPSULE | Freq: Two times a day (BID) | ORAL | 0 refills | Status: AC

## 2024-03-04 NOTE — Discharge Instructions (Signed)
 Your urinalysis showed signs of urinary tract infection.  Antibiotics have been sent to the pharmacy.  Your urinalysis will be sent for culture to confirm the type of bacteria and make sure will be treated by the antibiotic prescribed.  You will receive a phone call only if they need to be changed to the medication.  Please follow-up with urology.  The knot in your arm is a hematoma.  This is a collection of blood underneath the skin that happens as a result of an injury.  I recommend taking Tylenol  and using ice as needed for pain.  The hematoma will resolve on its own with time but this may take a few weeks to fully go away.

## 2024-03-04 NOTE — ED Notes (Signed)
 See triage note  Presents with care giver from group home    States he took a fall couple of days ago   Has a bruised area to left upper arm

## 2024-03-04 NOTE — ED Triage Notes (Signed)
 Possible blood clot  left arm.  Got a bruise couple days ago in a fall.  Says he doent know how he fell, but he missed the second step and it was dark.

## 2024-03-04 NOTE — ED Provider Notes (Signed)
 [facility] Provider Note    Event Date/Time   First MD Initiated Contact with Patient 03/04/24 1232     (approximate)   History   Arm Injury   HPI  Daniel [name] is a 33 y.o. male with PMH of schizoaffective disorder, conversion disorder, heart failure, type 2 diabetes, seizure disorder, hypertension, TIA, A-fib on apixaban  who presents for evaluation of an injury to the left arm.  Patient missed a step a week ago and fell hitting his arm on something.  Patient just reported some pain and swelling to the left forearm with some surrounding bruising.  Patient's caregiver states that her boss wanted him evaluated in the emergency department due to his complex medical history.  Patient also reports some burning with urination.      Physical Exam   Triage Vital Signs: ED Triage Vitals  Encounter Vitals Group     BP --      Girls Systolic BP Percentile --      Girls Diastolic BP Percentile --      Boys Systolic BP Percentile --      Boys Diastolic BP Percentile --      Pulse Rate 03/04/24 1155 75     Resp 03/04/24 1155 14     Temp 03/04/24 1155 99.1 F (37.3 C)     Temp Source 03/04/24 1155 Oral     SpO2 03/04/24 1155 96 %     Weight 03/04/24 1157 (!) 333 lb 3.2 oz (151.1 kg)     Height 03/04/24 1157 6' 5 (1.956 m)     Head Circumference --      Peak Flow --      Pain Score 03/04/24 1156 9     Pain Loc --      Pain Education --      Exclude from Growth Chart --     Most recent vital signs: Vitals:   03/04/24 1155 03/04/24 1315  BP:  122/70  Pulse: 75   Resp: 14   Temp: 99.1 F (37.3 C)   SpO2: 96% 96%   General: Awake, no distress.  CV:  Good peripheral perfusion.  Resp:  Normal effort.  Abd:  No distention.  Other:  Hard round not with surrounding ecchymosis and tenderness to palpation on the left forearm   ED Results / Procedures / Treatments   Labs (all labs ordered are listed, but only abnormal results are  displayed) Labs Reviewed  URINALYSIS, ROUTINE W REFLEX MICROSCOPIC - Abnormal; Notable for the following components:      Result Value   Color, Urine COLORLESS (*)    APPearance HAZY (*)    Glucose, UA 50 (*)    Leukocytes,Ua LARGE (*)    Bacteria, UA RARE (*)    All other components within normal limits  URINE CULTURE    PROCEDURES:  Critical Care performed: No  Procedures   MEDICATIONS ORDERED IN ED: Medications - No data to display   IMPRESSION / MDM / ASSESSMENT AND PLAN / ED COURSE  I reviewed the triage vital signs and the nursing notes.                             32 year old male presents for evaluation of a left arm injury.  Vital signs are stable patient NAD on exam.  Differential diagnosis includes, but is not limited to, contusion, hematoma, DVT, UTI.  Patient's presentation is most consistent with  acute, uncomplicated illness.  Patient has a hematoma to the left forearm.  There is no swelling to the left arm, very low suspicion for a blood clot given patient is on apixaban , will hold off on DVT study at this time.  Believe patient developed the hematoma as a result of being on the blood thinner.  Discussed taking Tylenol  as needed for pain.  Also encouraged him to ice it.  Patient reported some burning with urination.  UA today shows presence of large leukocytes, greater than 50 WBCs and rare bacteria.  Given that patient is symptomatic we will treat him for UTI and send urine for culture.  I reviewed patient's chart and last urine culture grew Aerococcus urinae which was susceptible to nitrofurantoin so we will place patient on this medication as he was previously treated with amoxicillin . Advised patient to follow up with urology.  Patient and caregivers voiced understanding, all questions were answered and he was stable at discharge.      FINAL CLINICAL IMPRESSION(S) / ED DIAGNOSES   Final diagnoses:  Hematoma of arm, left, initial encounter  Urinary  tract infection without hematuria, site unspecified     Rx / DC Orders   ED Discharge Orders          Ordered    nitrofurantoin, macrocrystal-monohydrate, (MACROBID) 100 MG capsule  2 times daily        03/04/24 1323             Note:  This document was prepared using Dragon voice recognition software and may include unintentional dictation errors.   Cleaster Tinnie LABOR, PA-C 03/04/24 1328    Jossie Artist POUR, MD 03/06/24 1133

## 2024-03-04 NOTE — ED Notes (Signed)
 Pt requesting urine be sent

## 2024-03-04 NOTE — ED Triage Notes (Signed)
 Called Daniel [name] guardian and explained patient was here and what for.

## 2024-03-05 LAB — URINE CULTURE: Culture: NO GROWTH

## 2024-03-07 ENCOUNTER — Other Ambulatory Visit: Payer: Self-pay

## 2024-03-07 ENCOUNTER — Other Ambulatory Visit: Payer: Self-pay | Admitting: Family

## 2024-03-14 ENCOUNTER — Other Ambulatory Visit: Payer: Self-pay

## 2024-03-14 DIAGNOSIS — J9611 Chronic respiratory failure with hypoxia: Secondary | ICD-10-CM

## 2024-03-14 DIAGNOSIS — G4733 Obstructive sleep apnea (adult) (pediatric): Secondary | ICD-10-CM

## 2024-03-15 NOTE — Addendum Note (Signed)
 Addended by: Orelia Brandstetter J on: 03/15/2024 11:11 AM   Modules accepted: Orders

## 2024-03-22 ENCOUNTER — Other Ambulatory Visit: Payer: Self-pay | Admitting: Family

## 2024-04-02 ENCOUNTER — Other Ambulatory Visit: Payer: Self-pay

## 2024-04-02 MED ORDER — SPIRONOLACTONE 25 MG PO TABS: 12.5000 mg | ORAL_TABLET | Freq: Every day | ORAL | 3 refills | Status: AC

## 2024-04-10 ENCOUNTER — Other Ambulatory Visit: Payer: Self-pay | Admitting: Family

## 2024-04-19 ENCOUNTER — Telehealth: Payer: Self-pay

## 2024-04-19 NOTE — Telephone Encounter (Signed)
 Copied from CRM [id number]. Topic: Clinical - Order For Equipment >> Mar 14, 2024  9:37 AM Devaughn RAMAN wrote: Reason for CRM: Ronal with [facility] calls called regarding [facility] needing a new order for a concentrator and filter for the patient. Ronal stated the patient adviser her he had not had oxygen for 3 weeks and the patient has congestive heart failure. Mary requested to speak with a provider or nurse, Contacted [facility] [place] spoke with Luke and transferred [place]. Ronal was thankful and verbalized understanding. >> Apr 16, 2024  1:14 PM Rozanna MATSU wrote: Karna with [employer] stated pts CPAP machine is still not working and looks like [facility] needs a new order.    Routing to [place] as this is [place] pt

## 2024-04-19 NOTE — Telephone Encounter (Signed)
 Tommye Forward with [facility] called me back. She stated that if Mr. Monnier is having issues with his 57 concentrator and his Bipap machine Ronal or Karna should call [facility] [phone]. I have left a message on Denise's number trying to explain this to her. They just need to call [facility] with any issues with Mr. Torre equipment

## 2024-04-19 NOTE — Telephone Encounter (Signed)
 I have called [facility] and no one answered the phone I had to leave a message

## 2024-05-13 ENCOUNTER — Other Ambulatory Visit: Payer: Self-pay | Admitting: Family

## 2024-06-20 ENCOUNTER — Telehealth: Payer: Self-pay | Admitting: Family

## 2024-06-20 NOTE — Telephone Encounter (Signed)
 Called to confirm/remind patient of [facility].   Appointment:   [x] Confirmed  [] Left mess   [] No answer/No voice mail  [] VM Full/unable to leave message  [] Phone not in service  Patient reminded to bring all medications and/or complete list.  Confirmed patient has transportation. Gave directions, instructed to utilize valet parking.

## 2024-06-20 NOTE — Progress Notes (Unsigned)
 PCP: Gwenith Shuck, NP Cardiology: Dr. Mady HF Cardiology: Dr. Rolan  Chief complaint: fatigue   HPI: Daniel [name] is a 34 y.o. with history of paroxysmal atrial fibrillation, HF with mid range EF, schizoaffective disorder, and chronic hypoxemic respiratory failure on home oxygen presents for evaluation of CHF.  Patient has had a long history of paroxysmal AF.  He says this was diagnosed when he was 20 and he was told that it was triggered by excessive use of energy drinks.  He never abused drugs or ETOH per his report though he was a smoker until 5/23.  He was followed in [place] in the past.  There was talk of atrial fibrillation ablation but it was never undertaken.  Subsequently, he moved to a group home in [place].  He has been followed by [facility].  He was hospitalized in early 2023 with atrial fibrillation and CHF.  Echo in 3/23 showed EF 50% and echo in 5/23 showed EF 45-50%, both instances it appeared he was in atrial fibrillation.  He has been in NSR in this office, however.     Patient has had home oxygen.  High resolution CT chest in 2/24 showed no ILD, possible small airways disease.  PFTs in 2/24 showed minimal obstructive airways disease.  Sleep study showed severe OSA.    Echo in 3/24 showed EF 55-60%, mild LV dilation, normal RV.  RHC in 5/24 showed normal filling pressures and preserved cardiac output.   Seen in [facility] 09/25 where propranolol was stopped as he was also taking toprol . Farxiga  was stopped due to frequent UTI's.  He presents today, with facility aide, for a HF follow-up visit with a chief complaint of moderate fatigue. Has associated shortness of breath, intermittent low chest wall pain from coughing, palpitations, dizziness with sudden position change, intermittent tremors in hands. Denies current cough, edema. Says that he just doesn't feel well. Thinks that he may be dehydrated. Feels like he's eating/ drinking well.   Aide confirms that he's  been using oxygen and bipap at night. Has had significant weight loss with trulicity .   Labs (12/23): K 4, creatinine 0.78, BNP 11 Labs (1/24): BNP 85 Labs (2/24): K 3.9, creatinine 1.07 Labs (8/25): K 4.4, creatinine 1.19 Labs (9/25): K 4.8, creatinine 1.32, GFR 73, BNP 34.3, Hg 13.5  PMH: 1. Gout 2. Atrial fibrillation: Paroxysmal.  First noted at age 76 per his report.  Grandmother and grandfather had AF but at advanced age.  3. HTN 4. OSA: severe on sleep study 5. Chronic hypoxemic respiratory failure: Patient states he has been using oxygen since 2022.  - Prior smoker, quit 4/23.  - High resolution CT chest (2/24): no ILD, possible small airways disease - PFTs (2/24): minimal obstructive airways disease.  6. H/o ?TIA 7. H/o urethral stricture 8. Schizoaffective disorder: Lives in group home.  9. H/o seizure disorder.  10. H/o COVID-19 x 3 11. Hyperlipidemia 12. HF with mid range EF: Echo (3/23) with EF 50%, suspect was in AF.   - Echo (5/23): EF 45-50%, RV normal IVC normal.  In setting of AF.  - Echo (3/24): EF 55-60%, mild LV dilation, normal RV. 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  Quit smoking 4/23.  No drugs or ETOH.   FH: Grandfather with MIs, grandmother with AF at advanced age, other grandfather with AF at advanced age.   ROS: All systems  reviewed and negative except as per HPI.   Prior to Admission medications  Medication Sig Start Date End Date Taking? Authorizing Provider  albuterol  (PROVENTIL ) (2.5 MG/3ML) 0.083% nebulizer solution Take 3 mLs (2.5 mg total) by nebulization every 6 (six) hours as needed for wheezing or shortness of breath. 10/16/23 10/15/24 Yes Ernest Ronal BRAVO, MD  allopurinol  (ZYLOPRIM ) 300 MG tablet Take 1 tablet (300 mg total) by mouth daily. 10/16/23 10/15/24 Yes Ernest Ronal BRAVO, MD  amoxicillin  (AMOXIL ) 875 MG tablet Take 1 tablet (875 mg total) by mouth every 12 (twelve)  hours. 02/05/24  Yes Stoioff, Glendia BROCKS, MD  apixaban  (ELIQUIS ) 5 MG TABS tablet @ TAKE 1 TABLET BY MOUTH TWICE DAILY 05/15/24  Yes [place] A, FNP  atorvastatin  (LIPITOR ) 80 MG tablet @ TAKE 1 TABLET BY MOUTH ONCE DAILY 04/16/24  Yes [place] A, FNP  baclofen  (LIORESAL ) 10 MG tablet Take 1 tablet (10 mg total) by mouth 2 (two) times daily. 10/16/23 10/15/24 Yes Ernest Ronal BRAVO, MD  benztropine  (COGENTIN ) 1 MG tablet Take 1 tablet (1 mg total) by mouth in the morning, at noon, and at bedtime. 10/16/23 06/21/24 Yes Ernest Ronal BRAVO, MD  budesonide -formoterol  (SYMBICORT ) 160-4.5 MCG/ACT inhaler Inhale 2 puffs into the lungs 2 (two) times daily. 10/16/23 06/21/24 Yes Ernest Ronal BRAVO, MD  CRANBERRY EXTRACT PO Take by mouth.   Yes [provider]  cyclobenzaprine  (FLEXERIL ) 5 MG tablet Take 5 mg by mouth 3 (three) times daily as needed for muscle spasms.   Yes [provider]  diltiazem  (CARDIZEM  CD) 360 MG 24 hr capsule @ TAKE 1 CAPSULE BY MOUTH ONCE DAILY *DO NOT CRUSH OR CHEW* 03/06/24  Yes [place] A, FNP  divalproex  (DEPAKOTE ) 500 MG DR tablet Take 2 tablets (1,000 mg total) by mouth 2 (two) times daily. 10/16/23 06/21/24 Yes Ernest Ronal BRAVO, MD  docusate sodium  (COLACE) 100 MG capsule Take 100 mg by mouth 2 (two) times daily.   Yes [provider]  Dulaglutide  (TRULICITY ) 1.5 MG/0.5ML SOAJ Inject 1.5 mg into the skin once a week. Every Sunday 12/15/23  Yes Melburn Treiber, [place] A, FNP  escitalopram  (LEXAPRO ) 20 MG tablet Take 1 tablet (20 mg total) by mouth daily. Patient taking differently: Take 10 mg by mouth daily. 10/16/23 10/15/24 Yes Ernest Ronal BRAVO, MD  fluticasone  (FLONASE ) 50 MCG/ACT nasal spray Place 2 sprays into both nostrils 2 (two) times daily. 08/31/23  Yes Kasa, Kurian, MD  furosemide  (LASIX ) 20 MG tablet TAKE 3 TABLETS =60MG  BY MOUTH ONCE DAILY 03/25/24  Yes [place] A, FNP  hydrOXYzine  (VISTARIL ) 25 MG capsule Take 25-50 mg by mouth 3 (three) times daily. 50 mg qam, 25 mg  at lunch, 50 mg evening   Yes [provider]  lithium  300 MG tablet Take 1 tablet (300 mg total) by mouth daily. 10/16/23 10/15/24 Yes Ernest Ronal BRAVO, MD  lithium  300 MG tablet Take 2 tablets (600 mg total) by mouth every evening. 10/16/23 06/21/24 Yes Ernest Ronal BRAVO, MD  loratadine  (CLARITIN ) 10 MG tablet Take 10 mg by mouth daily. 06/16/21  Yes [provider]  melatonin 3 MG TABS tablet Take 3 mg by mouth at bedtime.   Yes [provider]  metoprolol  succinate (TOPROL  XL) 100 MG 24 hr tablet Take 1.5 tablets (150 mg total) by mouth daily. Take with or immediately following a meal. 10/16/23 06/21/24 Yes Ernest Ronal BRAVO, MD  ondansetron  (ZOFRAN -ODT) 4 MG disintegrating tablet Take 1 tablet (4 mg total) by  mouth every 8 (eight) hours as needed. 12/02/22  Yes Claudene Rover, MD  paliperidone  (INVEGA ) 9 MG 24 hr tablet Take 1 tablet (9 mg total) by mouth every morning. 10/16/23 06/21/24 Yes Ernest Ronal BRAVO, MD  polyethylene glycol (MIRALAX / GLYCOLAX) 17 g packet Take 17 g by mouth daily.   Yes [provider]  potassium chloride  SA (KLOR-CON  M) 20 MEQ tablet Take 1 tablet (20 mEq total) by mouth daily. 12/07/23  Yes Maile Linford, Ellouise A, FNP  sodium chloride  (OCEAN) 0.65 % SOLN nasal spray Place 1 spray into both nostrils as needed for congestion. 05/09/23  Yes Kenishia Plack, Ellouise A, FNP  spironolactone  (ALDACTONE ) 25 MG tablet Take 0.5 tablets (12.5 mg total) by mouth daily. 04/02/24 07/01/24 Yes Oaklen Thiam, Ellouise A, FNP  tamsulosin  (FLOMAX ) 0.4 MG CAPS capsule Take 1 capsule (0.4 mg total) by mouth daily. 09/10/21  Yes Caleen Qualia, MD  fluticasone  furoate-vilanterol (BREO ELLIPTA ) 100-25 MCG/ACT AEPB Inhale 1 puff into the lungs daily. Patient not taking: Reported on 06/21/2024    [provider]  Multiple Vitamins-Minerals (MULTIVITAMIN GUMMIES ADULT PO) Take by mouth. Patient not taking: Reported on 06/21/2024    [provider]     Vitals:   06/21/24 1022  BP: (!) 99/51   Pulse: 64  SpO2: 97%  Weight: (!) 308 lb (139.7 kg)   Wt Readings from Last 3 Encounters:  06/21/24 (!) 308 lb (139.7 kg)  03/04/24 (!) 333 lb 3.2 oz (151.1 kg)  02/15/24 (!) 333 lb 3.2 oz (151.1 kg)   Lab Results  Component Value Date   CREATININE 1.32 (H) 02/15/2024   CREATININE 1.19 01/15/2024   CREATININE 0.95 01/02/2024     Physical Exam: General: Pale, obese male in NAD.  Cor: No JVD. Regular rhythm, rate.  Lungs: clear Abdomen: soft, nontender, nondistended. Extremities: no edema. Wearing compression socks bilaterally Neuro:. Affect pleasant although quiet today.    Assessment/Plan: 1. Atrial fibrillation: Paroxysmal.  Onset at very early age (20 per his report).  He has a family history of AF but others were of advanced age when they had AF.  ?Related to OHS/OSA and obesity.  Also used to abuse energy drinks per his report.  Recently, he seems to be staying in NSR.  - Continue Apixaban  5mg  BID. - Continue diltiazem  CD 360mg  daily.  - Continue Toprol  XL 150 mg daily.  - Would pursue atrial fibrillation ablation if he has recurrence of atrial fibrillation once he has lost some additional weight.   2. HFpEF: Echo in 3/23 showed EF 50% and echo in 5/23 showed EF 45-50%, both instances it appeared he was in atrial fibrillation.  Echo in 3/24 in NSR showed EF 55-60%. RHC in 5/24 was a normal study.  NYHA class III, not volume overloaded on exam.  - Farxiga  stopped at last visit due to frequent UTI's.  - Continue Lasix  60 mg daily. BMET / CBC today. He looks pale in skin color today - Stop entresto  due to hypotension (99/51) and dizziness  - He does not seem to be having as much trouble managing his volume.  If symptoms worsen in the future, could consider Cardiomems.  3. OSA: Severe OSA on sleep study.   - Caregiver that is present says that he's been wearing his Bipap nightly.  4. Obesity: Weight is trending down on dulaglutide .   - weight down 25 pounds from last visit  here 4 months ago 5. Chronic hypoxemic respiratory failure: Possible OHS/OSA as HRCT in 2/24  showed no ILD or significant emphysema and PFTs in 2/24 showed only minimal obstructive airways disease. He has severe OSA.   - Wearing bipap nightly along with oxygen.    Return in 1 month, sooner if needed.   I spent 31 minutes reviewing records, interviewing/ examing patient and managing plan/ orders.   Ellouise DELENA Class [facility] 06/20/2024

## 2024-06-21 ENCOUNTER — Other Ambulatory Visit
Admission: RE | Admit: 2024-06-21 | Discharge: 2024-06-21 | Disposition: A | Payer: MEDICAID | Source: Ambulatory Visit | Attending: Family | Admitting: Family

## 2024-06-21 ENCOUNTER — Ambulatory Visit: Payer: Self-pay | Admitting: Family

## 2024-06-21 ENCOUNTER — Encounter: Payer: Self-pay | Admitting: Family

## 2024-06-21 ENCOUNTER — Ambulatory Visit: Payer: MEDICAID | Admitting: Family

## 2024-06-21 VITALS — BP 99/51 | HR 64 | Wt 308.0 lb

## 2024-06-21 DIAGNOSIS — R531 Weakness: Secondary | ICD-10-CM

## 2024-06-21 DIAGNOSIS — I5032 Chronic diastolic (congestive) heart failure: Secondary | ICD-10-CM

## 2024-06-21 DIAGNOSIS — I480 Paroxysmal atrial fibrillation: Secondary | ICD-10-CM | POA: Diagnosis not present

## 2024-06-21 DIAGNOSIS — G4733 Obstructive sleep apnea (adult) (pediatric): Secondary | ICD-10-CM

## 2024-06-21 DIAGNOSIS — J9611 Chronic respiratory failure with hypoxia: Secondary | ICD-10-CM | POA: Diagnosis not present

## 2024-06-21 LAB — CBC
HCT: 31.7 % — ABNORMAL LOW (ref 39.0–52.0)
Hemoglobin: 10.4 g/dL — ABNORMAL LOW (ref 13.0–17.0)
MCH: 29.2 pg (ref 26.0–34.0)
MCHC: 32.8 g/dL (ref 30.0–36.0)
MCV: 89 fL (ref 80.0–100.0)
Platelets: 200 K/uL (ref 150–400)
RBC: 3.56 MIL/uL — ABNORMAL LOW (ref 4.22–5.81)
RDW: 14 % (ref 11.5–15.5)
WBC: 9.1 K/uL (ref 4.0–10.5)
nRBC: 0 % (ref 0.0–0.2)

## 2024-06-21 LAB — COMPREHENSIVE METABOLIC PANEL WITH GFR
ALT: 19 U/L (ref 0–44)
AST: 21 U/L (ref 15–41)
Albumin: 4.1 g/dL (ref 3.5–5.0)
Alkaline Phosphatase: 87 U/L (ref 38–126)
Anion gap: 7 (ref 5–15)
BUN: 25 mg/dL — ABNORMAL HIGH (ref 6–20)
CO2: 30 mmol/L (ref 22–32)
Calcium: 10.1 mg/dL (ref 8.9–10.3)
Chloride: 98 mmol/L (ref 98–111)
Creatinine, Ser: 1.5 mg/dL — ABNORMAL HIGH (ref 0.61–1.24)
GFR, Estimated: >60 mL/min
Glucose, Bld: 107 mg/dL — ABNORMAL HIGH (ref 70–99)
Potassium: 4.8 mmol/L (ref 3.5–5.1)
Sodium: 135 mmol/L (ref 135–145)
Total Bilirubin: 0.4 mg/dL (ref 0.0–1.2)
Total Protein: 6.9 g/dL (ref 6.5–8.1)

## 2024-06-21 MED ORDER — FUROSEMIDE 20 MG PO TABS: 40.0000 mg | ORAL_TABLET | Freq: Every day | ORAL | 5 refills | Status: AC

## 2024-06-21 NOTE — Telephone Encounter (Signed)
 Spoke to both caregiver and legal guardian about lab work and medication changes. Caregiver agreeable to lab work in or around 2 weeks at [facility]. Orders placed.

## 2024-06-21 NOTE — Patient Instructions (Addendum)
 Stop entresto .   Go over to the [facility] [place] for your lab work. We will only call if any other medications need to be changed.

## 2024-07-22 ENCOUNTER — Ambulatory Visit: Payer: MEDICAID | Admitting: Family
